# Patient Record
Sex: Female | Born: 1940 | Race: White | Hispanic: No | Marital: Married | Smoking: Former smoker
Health system: Southern US, Community
[De-identification: ages and names within clinical notes are randomized; demographics above are authoritative.]

## PROBLEM LIST (undated history)

## (undated) DIAGNOSIS — J449 Chronic obstructive pulmonary disease, unspecified: Secondary | ICD-10-CM

## (undated) DIAGNOSIS — F32A Depression, unspecified: Secondary | ICD-10-CM

## (undated) DIAGNOSIS — K219 Gastro-esophageal reflux disease without esophagitis: Secondary | ICD-10-CM

## (undated) DIAGNOSIS — E785 Hyperlipidemia, unspecified: Secondary | ICD-10-CM

## (undated) DIAGNOSIS — C801 Malignant (primary) neoplasm, unspecified: Secondary | ICD-10-CM

## (undated) DIAGNOSIS — Z8781 Personal history of (healed) traumatic fracture: Secondary | ICD-10-CM

## (undated) DIAGNOSIS — J439 Emphysema, unspecified: Secondary | ICD-10-CM

## (undated) DIAGNOSIS — F329 Major depressive disorder, single episode, unspecified: Secondary | ICD-10-CM

## (undated) HISTORY — DX: Hyperlipidemia, unspecified: E78.5

## (undated) HISTORY — DX: Emphysema, unspecified: J43.9

## (undated) HISTORY — DX: Chronic obstructive pulmonary disease, unspecified: J44.9

## (undated) HISTORY — PX: FRACTURE SURGERY: SHX138

## (undated) HISTORY — DX: Malignant (primary) neoplasm, unspecified: C80.1

---
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
## 1974-10-11 HISTORY — PX: RIGHT OOPHORECTOMY: SHX2359

## 1995-10-12 HISTORY — PX: CYSTECTOMY W/ URETEROILEAL CONDUIT: SUR361

## 1999-01-09 ENCOUNTER — Other Ambulatory Visit: Admission: RE | Admit: 1999-01-09 | Discharge: 1999-01-09 | Payer: Self-pay | Admitting: Physician Assistant

## 1999-01-21 ENCOUNTER — Ambulatory Visit (HOSPITAL_COMMUNITY): Admission: RE | Admit: 1999-01-21 | Discharge: 1999-01-21 | Payer: Self-pay | Admitting: Internal Medicine

## 1999-01-23 ENCOUNTER — Ambulatory Visit (HOSPITAL_COMMUNITY): Admission: RE | Admit: 1999-01-23 | Discharge: 1999-01-23 | Payer: Self-pay | Admitting: *Deleted

## 1999-02-06 ENCOUNTER — Ambulatory Visit (HOSPITAL_COMMUNITY): Admission: RE | Admit: 1999-02-06 | Discharge: 1999-02-06 | Payer: Self-pay | Admitting: Oncology

## 2001-01-31 ENCOUNTER — Encounter: Admission: RE | Admit: 2001-01-31 | Discharge: 2001-05-01 | Payer: Self-pay | Admitting: Radiation Oncology

## 2002-09-12 ENCOUNTER — Ambulatory Visit: Admission: RE | Admit: 2002-09-12 | Discharge: 2002-10-22 | Payer: Self-pay | Admitting: Radiation Oncology

## 2002-10-26 ENCOUNTER — Ambulatory Visit: Admission: RE | Admit: 2002-10-26 | Discharge: 2002-10-26 | Payer: Self-pay | Admitting: Oncology

## 2002-10-29 ENCOUNTER — Ambulatory Visit: Admission: RE | Admit: 2002-10-29 | Discharge: 2002-12-05 | Payer: Self-pay | Admitting: Radiation Oncology

## 2004-01-13 ENCOUNTER — Encounter: Payer: Self-pay | Admitting: Internal Medicine

## 2004-08-30 ENCOUNTER — Ambulatory Visit: Payer: Self-pay | Admitting: Oncology

## 2004-11-10 ENCOUNTER — Ambulatory Visit: Payer: Self-pay | Admitting: Oncology

## 2005-02-09 ENCOUNTER — Ambulatory Visit: Payer: Self-pay | Admitting: Oncology

## 2005-04-05 ENCOUNTER — Ambulatory Visit: Payer: Self-pay | Admitting: Oncology

## 2005-05-27 ENCOUNTER — Ambulatory Visit: Payer: Self-pay | Admitting: Internal Medicine

## 2005-08-09 ENCOUNTER — Ambulatory Visit: Payer: Self-pay | Admitting: Oncology

## 2005-11-11 ENCOUNTER — Ambulatory Visit: Payer: Self-pay | Admitting: Oncology

## 2006-01-31 ENCOUNTER — Ambulatory Visit (HOSPITAL_COMMUNITY): Admission: RE | Admit: 2006-01-31 | Discharge: 2006-01-31 | Payer: Self-pay | Admitting: Oncology

## 2006-02-03 ENCOUNTER — Ambulatory Visit: Payer: Self-pay | Admitting: Oncology

## 2006-02-04 LAB — CBC WITH DIFFERENTIAL/PLATELET
Basophils Absolute: 0 10*3/uL (ref 0.0–0.1)
Eosinophils Absolute: 0.2 10*3/uL (ref 0.0–0.5)
HCT: 39.8 % (ref 34.8–46.6)
LYMPH%: 19.1 % (ref 14.0–48.0)
MCV: 94 fL (ref 81.0–101.0)
MONO#: 0.5 10*3/uL (ref 0.1–0.9)
MONO%: 9.9 % (ref 0.0–13.0)
NEUT#: 3.1 10*3/uL (ref 1.5–6.5)
NEUT%: 66.6 % (ref 39.6–76.8)
Platelets: 254 10*3/uL (ref 145–400)
WBC: 4.6 10*3/uL (ref 3.9–10.0)

## 2006-02-04 LAB — COMPREHENSIVE METABOLIC PANEL
BUN: 21 mg/dL (ref 6–23)
CO2: 29 mEq/L (ref 19–32)
Creatinine, Ser: 0.8 mg/dL (ref 0.4–1.2)
Glucose, Bld: 89 mg/dL (ref 70–99)
Total Bilirubin: 0.5 mg/dL (ref 0.3–1.2)
Total Protein: 6.8 g/dL (ref 6.0–8.3)

## 2006-02-04 LAB — LACTATE DEHYDROGENASE: LDH: 206 U/L (ref 94–250)

## 2006-03-14 LAB — CBC WITH DIFFERENTIAL/PLATELET
Basophils Absolute: 0.1 10*3/uL (ref 0.0–0.1)
EOS%: 3.6 % (ref 0.0–7.0)
Eosinophils Absolute: 0.2 10*3/uL (ref 0.0–0.5)
HGB: 14.1 g/dL (ref 11.6–15.9)
LYMPH%: 18.6 % (ref 14.0–48.0)
MCH: 33.3 pg (ref 26.0–34.0)
MCV: 92.8 fL (ref 81.0–101.0)
MONO%: 11.3 % (ref 0.0–13.0)
NEUT#: 3.6 10*3/uL (ref 1.5–6.5)
Platelets: 263 10*3/uL (ref 145–400)
RBC: 4.22 10*6/uL (ref 3.70–5.32)
RDW: 11.3 % (ref 11.3–14.5)

## 2006-03-14 LAB — COMPREHENSIVE METABOLIC PANEL
AST: 18 U/L (ref 0–37)
Alkaline Phosphatase: 97 U/L (ref 39–117)
BUN: 19 mg/dL (ref 6–23)
Glucose, Bld: 107 mg/dL — ABNORMAL HIGH (ref 70–99)
Potassium: 3.8 mEq/L (ref 3.5–5.3)
Total Bilirubin: 0.3 mg/dL (ref 0.3–1.2)

## 2006-03-17 ENCOUNTER — Ambulatory Visit: Payer: Self-pay | Admitting: Oncology

## 2006-05-27 ENCOUNTER — Ambulatory Visit: Payer: Self-pay | Admitting: Oncology

## 2006-06-03 LAB — COMPREHENSIVE METABOLIC PANEL
ALT: 14 U/L (ref 0–40)
AST: 24 U/L (ref 0–37)
Albumin: 4.7 g/dL (ref 3.5–5.2)
Alkaline Phosphatase: 121 U/L — ABNORMAL HIGH (ref 39–117)
Calcium: 9.9 mg/dL (ref 8.4–10.5)
Chloride: 104 mEq/L (ref 96–112)
Potassium: 3.9 mEq/L (ref 3.5–5.3)

## 2006-06-03 LAB — CBC WITH DIFFERENTIAL/PLATELET
BASO%: 0.6 % (ref 0.0–2.0)
EOS%: 3.6 % (ref 0.0–7.0)
MCH: 33.5 pg (ref 26.0–34.0)
MCHC: 35.5 g/dL (ref 32.0–36.0)
MCV: 94.3 fL (ref 81.0–101.0)
MONO%: 11.6 % (ref 0.0–13.0)
RBC: 4.24 10*6/uL (ref 3.70–5.32)
RDW: 12.6 % (ref 11.3–14.5)
lymph#: 1 10*3/uL (ref 0.9–3.3)

## 2006-08-04 ENCOUNTER — Ambulatory Visit: Payer: Self-pay | Admitting: Internal Medicine

## 2006-08-10 ENCOUNTER — Ambulatory Visit: Payer: Self-pay | Admitting: Oncology

## 2006-08-12 LAB — CBC WITH DIFFERENTIAL/PLATELET
Basophils Absolute: 0 10*3/uL (ref 0.0–0.1)
EOS%: 3.6 % (ref 0.0–7.0)
Eosinophils Absolute: 0.2 10*3/uL (ref 0.0–0.5)
HCT: 39.5 % (ref 34.8–46.6)
HGB: 13.9 g/dL (ref 11.6–15.9)
MCH: 33.3 pg (ref 26.0–34.0)
MCV: 94.4 fL (ref 81.0–101.0)
MONO%: 9.8 % (ref 0.0–13.0)
NEUT#: 3.3 10*3/uL (ref 1.5–6.5)
NEUT%: 65.7 % (ref 39.6–76.8)
RDW: 12.7 % (ref 11.3–14.5)

## 2006-08-12 LAB — COMPREHENSIVE METABOLIC PANEL
Albumin: 4.8 g/dL (ref 3.5–5.2)
BUN: 19 mg/dL (ref 6–23)
Calcium: 9.7 mg/dL (ref 8.4–10.5)
Chloride: 102 mEq/L (ref 96–112)
Creatinine, Ser: 0.85 mg/dL (ref 0.40–1.20)
Glucose, Bld: 87 mg/dL (ref 70–99)
Potassium: 4 mEq/L (ref 3.5–5.3)

## 2006-09-27 ENCOUNTER — Ambulatory Visit: Payer: Self-pay | Admitting: Oncology

## 2006-12-13 ENCOUNTER — Ambulatory Visit: Payer: Self-pay | Admitting: Oncology

## 2006-12-16 LAB — CBC WITH DIFFERENTIAL/PLATELET
BASO%: 0.5 % (ref 0.0–2.0)
Eosinophils Absolute: 0.2 10*3/uL (ref 0.0–0.5)
HCT: 39.7 % (ref 34.8–46.6)
HGB: 14 g/dL (ref 11.6–15.9)
MCHC: 35.1 g/dL (ref 32.0–36.0)
MONO#: 0.6 10*3/uL (ref 0.1–0.9)
NEUT#: 3.2 10*3/uL (ref 1.5–6.5)
NEUT%: 63.3 % (ref 39.6–76.8)
WBC: 5.1 10*3/uL (ref 3.9–10.0)
lymph#: 1.1 10*3/uL (ref 0.9–3.3)

## 2006-12-16 LAB — COMPREHENSIVE METABOLIC PANEL
ALT: 14 U/L (ref 0–35)
CO2: 27 mEq/L (ref 19–32)
Calcium: 9.7 mg/dL (ref 8.4–10.5)
Chloride: 103 mEq/L (ref 96–112)
Creatinine, Ser: 0.79 mg/dL (ref 0.40–1.20)
Glucose, Bld: 88 mg/dL (ref 70–99)
Sodium: 140 mEq/L (ref 135–145)
Total Protein: 6.8 g/dL (ref 6.0–8.3)

## 2006-12-16 LAB — LACTATE DEHYDROGENASE: LDH: 209 U/L (ref 94–250)

## 2007-03-08 ENCOUNTER — Ambulatory Visit: Payer: Self-pay | Admitting: Oncology

## 2007-03-10 LAB — CBC WITH DIFFERENTIAL/PLATELET
Basophils Absolute: 0.1 10*3/uL (ref 0.0–0.1)
Eosinophils Absolute: 0.3 10*3/uL (ref 0.0–0.5)
HGB: 14 g/dL (ref 11.6–15.9)
MCV: 91.6 fL (ref 81.0–101.0)
MONO#: 0.6 10*3/uL (ref 0.1–0.9)
NEUT#: 3.5 10*3/uL (ref 1.5–6.5)
RBC: 4.23 10*6/uL (ref 3.70–5.32)
RDW: 10.7 % — ABNORMAL LOW (ref 11.3–14.5)
WBC: 5.7 10*3/uL (ref 3.9–10.0)
lymph#: 1.3 10*3/uL (ref 0.9–3.3)

## 2007-05-05 ENCOUNTER — Ambulatory Visit: Payer: Self-pay | Admitting: Internal Medicine

## 2007-05-05 DIAGNOSIS — F329 Major depressive disorder, single episode, unspecified: Secondary | ICD-10-CM

## 2007-05-23 ENCOUNTER — Ambulatory Visit: Payer: Self-pay | Admitting: Oncology

## 2007-05-26 LAB — COMPREHENSIVE METABOLIC PANEL
AST: 20 U/L (ref 0–37)
Albumin: 4.5 g/dL (ref 3.5–5.2)
Alkaline Phosphatase: 116 U/L (ref 39–117)
BUN: 18 mg/dL (ref 6–23)
Calcium: 9.8 mg/dL (ref 8.4–10.5)
Chloride: 104 mEq/L (ref 96–112)
Glucose, Bld: 84 mg/dL (ref 70–99)
Potassium: 4 mEq/L (ref 3.5–5.3)
Sodium: 142 mEq/L (ref 135–145)
Total Protein: 6.5 g/dL (ref 6.0–8.3)

## 2007-05-26 LAB — CBC WITH DIFFERENTIAL/PLATELET
Basophils Absolute: 0 10*3/uL (ref 0.0–0.1)
Eosinophils Absolute: 0.2 10*3/uL (ref 0.0–0.5)
HCT: 37.5 % (ref 34.8–46.6)
HGB: 13.3 g/dL (ref 11.6–15.9)
LYMPH%: 20.3 % (ref 14.0–48.0)
MCV: 92.7 fL (ref 81.0–101.0)
MONO#: 0.7 10*3/uL (ref 0.1–0.9)
MONO%: 13 % (ref 0.0–13.0)
NEUT#: 3.2 10*3/uL (ref 1.5–6.5)
Platelets: 266 10*3/uL (ref 145–400)
WBC: 5.2 10*3/uL (ref 3.9–10.0)

## 2007-05-26 LAB — MORPHOLOGY

## 2007-06-08 ENCOUNTER — Other Ambulatory Visit: Admission: RE | Admit: 2007-06-08 | Discharge: 2007-06-08 | Payer: Self-pay | Admitting: Obstetrics and Gynecology

## 2007-08-24 ENCOUNTER — Ambulatory Visit: Payer: Self-pay | Admitting: Internal Medicine

## 2007-09-11 ENCOUNTER — Ambulatory Visit: Payer: Self-pay | Admitting: Oncology

## 2007-09-13 ENCOUNTER — Encounter: Payer: Self-pay | Admitting: Internal Medicine

## 2007-09-13 LAB — CBC WITH DIFFERENTIAL/PLATELET
BASO%: 0.5 % (ref 0.0–2.0)
EOS%: 3.7 % (ref 0.0–7.0)
HGB: 13.6 g/dL (ref 11.6–15.9)
MCH: 33 pg (ref 26.0–34.0)
MCHC: 35.5 g/dL (ref 32.0–36.0)
MONO#: 0.7 10*3/uL (ref 0.1–0.9)
RDW: 12.8 % (ref 11.3–14.5)
WBC: 6.8 10*3/uL (ref 3.9–10.0)
lymph#: 1.1 10*3/uL (ref 0.9–3.3)

## 2007-09-13 LAB — COMPREHENSIVE METABOLIC PANEL
Alkaline Phosphatase: 124 U/L — ABNORMAL HIGH (ref 39–117)
BUN: 21 mg/dL (ref 6–23)
Creatinine, Ser: 0.86 mg/dL (ref 0.40–1.20)
Glucose, Bld: 96 mg/dL (ref 70–99)
Potassium: 3.8 mEq/L (ref 3.5–5.3)
Total Bilirubin: 0.5 mg/dL (ref 0.3–1.2)

## 2007-09-13 LAB — LACTATE DEHYDROGENASE: LDH: 225 U/L (ref 94–250)

## 2007-09-25 ENCOUNTER — Ambulatory Visit: Payer: Self-pay | Admitting: Internal Medicine

## 2007-10-17 ENCOUNTER — Ambulatory Visit: Payer: Self-pay | Admitting: Internal Medicine

## 2007-11-08 ENCOUNTER — Encounter: Payer: Self-pay | Admitting: Internal Medicine

## 2007-11-27 ENCOUNTER — Ambulatory Visit: Payer: Self-pay | Admitting: Internal Medicine

## 2007-11-27 DIAGNOSIS — C8588 Other specified types of non-Hodgkin lymphoma, lymph nodes of multiple sites: Secondary | ICD-10-CM

## 2007-11-28 LAB — CONVERTED CEMR LAB
Albumin: 3.6 g/dL (ref 3.5–5.2)
Basophils Absolute: 0 10*3/uL (ref 0.0–0.1)
Bilirubin, Direct: 0.1 mg/dL (ref 0.0–0.3)
Creatinine, Ser: 0.8 mg/dL (ref 0.4–1.2)
HCT: 35 % — ABNORMAL LOW (ref 36.0–46.0)
Hemoglobin: 11.9 g/dL — ABNORMAL LOW (ref 12.0–15.0)
MCHC: 34.1 g/dL (ref 30.0–36.0)
MCV: 93.8 fL (ref 78.0–100.0)
Monocytes Absolute: 0.2 10*3/uL (ref 0.2–0.7)
Neutrophils Relative %: 90.4 % — ABNORMAL HIGH (ref 43.0–77.0)
Potassium: 3.8 meq/L (ref 3.5–5.1)
RDW: 12.6 % (ref 11.5–14.6)
Sodium: 139 meq/L (ref 135–145)
Total Bilirubin: 0.5 mg/dL (ref 0.3–1.2)
Total Protein: 6.3 g/dL (ref 6.0–8.3)

## 2007-12-07 ENCOUNTER — Ambulatory Visit: Payer: Self-pay | Admitting: Oncology

## 2007-12-12 ENCOUNTER — Encounter: Payer: Self-pay | Admitting: Internal Medicine

## 2007-12-12 LAB — COMPREHENSIVE METABOLIC PANEL
ALT: 12 U/L (ref 0–35)
AST: 16 U/L (ref 0–37)
Alkaline Phosphatase: 117 U/L (ref 39–117)
BUN: 20 mg/dL (ref 6–23)
Calcium: 10 mg/dL (ref 8.4–10.5)
Creatinine, Ser: 0.77 mg/dL (ref 0.40–1.20)
Total Bilirubin: 0.4 mg/dL (ref 0.3–1.2)

## 2007-12-12 LAB — CBC WITH DIFFERENTIAL/PLATELET
Basophils Absolute: 0 10*3/uL (ref 0.0–0.1)
EOS%: 2.4 % (ref 0.0–7.0)
HCT: 37.1 % (ref 34.8–46.6)
HGB: 13 g/dL (ref 11.6–15.9)
MCH: 31.5 pg (ref 26.0–34.0)
MCV: 89.9 fL (ref 81.0–101.0)
MONO%: 7.8 % (ref 0.0–13.0)
NEUT%: 72.4 % (ref 39.6–76.8)
Platelets: 363 10*3/uL (ref 145–400)

## 2008-02-07 ENCOUNTER — Encounter: Payer: Self-pay | Admitting: Internal Medicine

## 2008-03-08 ENCOUNTER — Encounter: Payer: Self-pay | Admitting: Internal Medicine

## 2008-03-11 ENCOUNTER — Ambulatory Visit: Payer: Self-pay | Admitting: Oncology

## 2008-03-11 ENCOUNTER — Encounter: Payer: Self-pay | Admitting: Internal Medicine

## 2008-03-13 ENCOUNTER — Encounter: Payer: Self-pay | Admitting: Internal Medicine

## 2008-03-13 LAB — CBC WITH DIFFERENTIAL/PLATELET
BASO%: 0.6 % (ref 0.0–2.0)
EOS%: 3.2 % (ref 0.0–7.0)
HCT: 38.4 % (ref 34.8–46.6)
MCH: 32.2 pg (ref 26.0–34.0)
MCHC: 35.1 g/dL (ref 32.0–36.0)
NEUT%: 71 % (ref 39.6–76.8)
lymph#: 0.8 10*3/uL — ABNORMAL LOW (ref 0.9–3.3)

## 2008-03-13 LAB — COMPREHENSIVE METABOLIC PANEL
ALT: 18 U/L (ref 0–35)
AST: 22 U/L (ref 0–37)
Chloride: 102 mEq/L (ref 96–112)
Creatinine, Ser: 0.91 mg/dL (ref 0.40–1.20)
Total Bilirubin: 0.5 mg/dL (ref 0.3–1.2)

## 2008-04-22 ENCOUNTER — Ambulatory Visit: Payer: Self-pay | Admitting: Internal Medicine

## 2008-04-30 LAB — CONVERTED CEMR LAB
ALT: 21 units/L (ref 0–35)
AST: 26 units/L (ref 0–37)
Albumin: 4.1 g/dL (ref 3.5–5.2)
Alkaline Phosphatase: 108 units/L (ref 39–117)
BUN: 16 mg/dL (ref 6–23)
Basophils Relative: 1 % (ref 0.0–1.0)
CO2: 32 meq/L (ref 19–32)
Chloride: 103 meq/L (ref 96–112)
Creatinine, Ser: 0.8 mg/dL (ref 0.4–1.2)
Eosinophils Relative: 3.2 % (ref 0.0–5.0)
Lymphocytes Relative: 17 % (ref 12.0–46.0)
Monocytes Relative: 11 % (ref 3.0–12.0)
Neutrophils Relative %: 67.8 % (ref 43.0–77.0)
RBC: 4.04 M/uL (ref 3.87–5.11)
TSH: 2.76 microintl units/mL (ref 0.35–5.50)
WBC: 4.7 10*3/uL (ref 4.5–10.5)

## 2008-06-18 ENCOUNTER — Ambulatory Visit: Payer: Self-pay | Admitting: Oncology

## 2008-06-21 ENCOUNTER — Encounter: Payer: Self-pay | Admitting: Internal Medicine

## 2008-06-21 LAB — CBC WITH DIFFERENTIAL/PLATELET
BASO%: 0.5 % (ref 0.0–2.0)
EOS%: 3.7 % (ref 0.0–7.0)
MCH: 32.1 pg (ref 26.0–34.0)
MCHC: 35 g/dL (ref 32.0–36.0)
RBC: 4.3 10*6/uL (ref 3.70–5.32)
RDW: 12.7 % (ref 11.3–14.5)
WBC: 4.4 10*3/uL (ref 3.9–10.0)
lymph#: 0.7 10*3/uL — ABNORMAL LOW (ref 0.9–3.3)

## 2008-06-21 LAB — COMPREHENSIVE METABOLIC PANEL
ALT: 18 U/L (ref 0–35)
AST: 25 U/L (ref 0–37)
Albumin: 4.8 g/dL (ref 3.5–5.2)
Calcium: 10 mg/dL (ref 8.4–10.5)
Chloride: 100 mEq/L (ref 96–112)
Creatinine, Ser: 0.86 mg/dL (ref 0.40–1.20)
Potassium: 3.9 mEq/L (ref 3.5–5.3)
Sodium: 140 mEq/L (ref 135–145)
Total Protein: 6.9 g/dL (ref 6.0–8.3)

## 2008-07-16 ENCOUNTER — Ambulatory Visit: Payer: Self-pay | Admitting: Internal Medicine

## 2008-09-18 ENCOUNTER — Ambulatory Visit: Payer: Self-pay | Admitting: Oncology

## 2008-09-20 ENCOUNTER — Encounter: Payer: Self-pay | Admitting: Internal Medicine

## 2008-09-20 LAB — CBC WITH DIFFERENTIAL/PLATELET
BASO%: 0.3 % (ref 0.0–2.0)
EOS%: 1.9 % (ref 0.0–7.0)
MCH: 32.1 pg (ref 26.0–34.0)
MCHC: 34.6 g/dL (ref 32.0–36.0)
MONO#: 0.6 10*3/uL (ref 0.1–0.9)
NEUT%: 76.4 % (ref 39.6–76.8)
RBC: 4.29 10*6/uL (ref 3.70–5.32)
RDW: 13 % (ref 11.3–14.5)
WBC: 5.5 10*3/uL (ref 3.9–10.0)
lymph#: 0.6 10*3/uL — ABNORMAL LOW (ref 0.9–3.3)

## 2008-09-23 LAB — COMPREHENSIVE METABOLIC PANEL
ALT: 20 U/L (ref 0–35)
AST: 23 U/L (ref 0–37)
Calcium: 10.1 mg/dL (ref 8.4–10.5)
Chloride: 103 mEq/L (ref 96–112)
Creatinine, Ser: 0.9 mg/dL (ref 0.40–1.20)
Potassium: 4.1 mEq/L (ref 3.5–5.3)
Sodium: 141 mEq/L (ref 135–145)
Total Protein: 6.8 g/dL (ref 6.0–8.3)

## 2008-11-27 ENCOUNTER — Encounter (INDEPENDENT_AMBULATORY_CARE_PROVIDER_SITE_OTHER): Payer: Self-pay | Admitting: *Deleted

## 2008-12-11 ENCOUNTER — Ambulatory Visit: Payer: Self-pay | Admitting: Internal Medicine

## 2008-12-25 ENCOUNTER — Ambulatory Visit (HOSPITAL_COMMUNITY): Admission: RE | Admit: 2008-12-25 | Discharge: 2008-12-25 | Payer: Self-pay | Admitting: Oncology

## 2008-12-31 ENCOUNTER — Encounter: Payer: Self-pay | Admitting: Internal Medicine

## 2009-01-01 ENCOUNTER — Encounter: Payer: Self-pay | Admitting: Internal Medicine

## 2009-01-09 ENCOUNTER — Ambulatory Visit: Payer: Self-pay | Admitting: Oncology

## 2009-01-09 ENCOUNTER — Ambulatory Visit: Payer: Self-pay | Admitting: Internal Medicine

## 2009-01-14 ENCOUNTER — Encounter: Payer: Self-pay | Admitting: Internal Medicine

## 2009-01-14 LAB — CBC WITH DIFFERENTIAL/PLATELET
Basophils Absolute: 0 10*3/uL (ref 0.0–0.1)
Eosinophils Absolute: 0.1 10*3/uL (ref 0.0–0.5)
HCT: 36.1 % (ref 34.8–46.6)
HGB: 12.5 g/dL (ref 11.6–15.9)
LYMPH%: 13.5 % — ABNORMAL LOW (ref 14.0–49.7)
MCV: 92.6 fL (ref 79.5–101.0)
MONO#: 0.6 10*3/uL (ref 0.1–0.9)
MONO%: 11.7 % (ref 0.0–14.0)
NEUT#: 3.5 10*3/uL (ref 1.5–6.5)
NEUT%: 72.1 % (ref 38.4–76.8)
Platelets: 203 10*3/uL (ref 145–400)
RBC: 3.9 10*6/uL (ref 3.70–5.45)
WBC: 4.9 10*3/uL (ref 3.9–10.3)

## 2009-01-14 LAB — COMPREHENSIVE METABOLIC PANEL
BUN: 27 mg/dL — ABNORMAL HIGH (ref 6–23)
CO2: 28 mEq/L (ref 19–32)
Creatinine, Ser: 1.15 mg/dL (ref 0.40–1.20)
Glucose, Bld: 86 mg/dL (ref 70–99)
Sodium: 141 mEq/L (ref 135–145)
Total Bilirubin: 0.4 mg/dL (ref 0.3–1.2)
Total Protein: 6.5 g/dL (ref 6.0–8.3)

## 2009-01-14 LAB — URIC ACID: Uric Acid, Serum: 7.8 mg/dL — ABNORMAL HIGH (ref 2.4–7.0)

## 2009-01-14 LAB — LACTATE DEHYDROGENASE: LDH: 268 U/L — ABNORMAL HIGH (ref 94–250)

## 2009-01-14 LAB — MORPHOLOGY: RBC Comments: NORMAL

## 2009-01-23 ENCOUNTER — Encounter: Payer: Self-pay | Admitting: Internal Medicine

## 2009-01-23 ENCOUNTER — Ambulatory Visit: Payer: Self-pay | Admitting: Internal Medicine

## 2009-01-25 ENCOUNTER — Encounter: Payer: Self-pay | Admitting: Internal Medicine

## 2009-01-30 ENCOUNTER — Ambulatory Visit (HOSPITAL_COMMUNITY): Admission: RE | Admit: 2009-01-30 | Discharge: 2009-01-30 | Payer: Self-pay | Admitting: Urology

## 2009-01-30 ENCOUNTER — Encounter (INDEPENDENT_AMBULATORY_CARE_PROVIDER_SITE_OTHER): Payer: Self-pay | Admitting: Surgery

## 2009-02-04 ENCOUNTER — Encounter: Payer: Self-pay | Admitting: Internal Medicine

## 2009-02-04 LAB — COMPREHENSIVE METABOLIC PANEL
ALT: 16 U/L (ref 0–35)
AST: 20 U/L (ref 0–37)
Albumin: 4.4 g/dL (ref 3.5–5.2)
Calcium: 9.5 mg/dL (ref 8.4–10.5)
Chloride: 103 mEq/L (ref 96–112)
Potassium: 3.3 mEq/L — ABNORMAL LOW (ref 3.5–5.3)
Total Protein: 6.6 g/dL (ref 6.0–8.3)

## 2009-02-04 LAB — CBC WITH DIFFERENTIAL/PLATELET
BASO%: 0.5 % (ref 0.0–2.0)
Basophils Absolute: 0 10*3/uL (ref 0.0–0.1)
EOS%: 3.8 % (ref 0.0–7.0)
HGB: 12.1 g/dL (ref 11.6–15.9)
MCH: 32.4 pg (ref 25.1–34.0)
MCHC: 35 g/dL (ref 31.5–36.0)
RDW: 13.3 % (ref 11.2–14.5)
lymph#: 0.5 10*3/uL — ABNORMAL LOW (ref 0.9–3.3)

## 2009-02-04 LAB — URIC ACID: Uric Acid, Serum: 4.7 mg/dL (ref 2.4–7.0)

## 2009-02-05 ENCOUNTER — Telehealth: Payer: Self-pay | Admitting: Internal Medicine

## 2009-02-06 ENCOUNTER — Ambulatory Visit: Payer: Self-pay | Admitting: Internal Medicine

## 2009-02-07 ENCOUNTER — Encounter: Payer: Self-pay | Admitting: Internal Medicine

## 2009-02-07 ENCOUNTER — Telehealth: Payer: Self-pay | Admitting: Internal Medicine

## 2009-02-13 ENCOUNTER — Ambulatory Visit: Payer: Self-pay | Admitting: Internal Medicine

## 2009-02-14 ENCOUNTER — Encounter: Payer: Self-pay | Admitting: Internal Medicine

## 2009-02-14 LAB — BASIC METABOLIC PANEL
Calcium: 9.3 mg/dL (ref 8.4–10.5)
Glucose, Bld: 122 mg/dL — ABNORMAL HIGH (ref 70–99)
Potassium: 3 mEq/L — ABNORMAL LOW (ref 3.5–5.3)
Sodium: 141 mEq/L (ref 135–145)

## 2009-02-14 LAB — CBC WITH DIFFERENTIAL/PLATELET
BASO%: 0.4 % (ref 0.0–2.0)
EOS%: 2 % (ref 0.0–7.0)
MCH: 31.9 pg (ref 25.1–34.0)
MCHC: 34.5 g/dL (ref 31.5–36.0)
MCV: 92.4 fL (ref 79.5–101.0)
MONO%: 6.6 % (ref 0.0–14.0)
RBC: 3.87 10*6/uL (ref 3.70–5.45)
RDW: 13.7 % (ref 11.2–14.5)
lymph#: 0.7 10*3/uL — ABNORMAL LOW (ref 0.9–3.3)

## 2009-02-14 LAB — URIC ACID: Uric Acid, Serum: 8.1 mg/dL — ABNORMAL HIGH (ref 2.4–7.0)

## 2009-02-21 ENCOUNTER — Ambulatory Visit: Payer: Self-pay | Admitting: Oncology

## 2009-02-21 LAB — CBC WITH DIFFERENTIAL/PLATELET
Basophils Absolute: 0 10*3/uL (ref 0.0–0.1)
Eosinophils Absolute: 0.2 10*3/uL (ref 0.0–0.5)
HCT: 36.3 % (ref 34.8–46.6)
HGB: 12.5 g/dL (ref 11.6–15.9)
LYMPH%: 12.9 % — ABNORMAL LOW (ref 14.0–49.7)
MCHC: 34.4 g/dL (ref 31.5–36.0)
MONO#: 0.5 10*3/uL (ref 0.1–0.9)
NEUT%: 76.4 % (ref 38.4–76.8)
Platelets: 261 10*3/uL (ref 145–400)
WBC: 6.6 10*3/uL (ref 3.9–10.3)
lymph#: 0.9 10*3/uL (ref 0.9–3.3)

## 2009-02-21 LAB — COMPREHENSIVE METABOLIC PANEL
ALT: 13 U/L (ref 0–35)
BUN: 20 mg/dL (ref 6–23)
CO2: 26 mEq/L (ref 19–32)
Calcium: 10.3 mg/dL (ref 8.4–10.5)
Chloride: 102 mEq/L (ref 96–112)
Creatinine, Ser: 0.94 mg/dL (ref 0.40–1.20)
Glucose, Bld: 116 mg/dL — ABNORMAL HIGH (ref 70–99)
Total Bilirubin: 0.4 mg/dL (ref 0.3–1.2)

## 2009-02-21 LAB — LACTATE DEHYDROGENASE: LDH: 244 U/L (ref 94–250)

## 2009-02-25 LAB — BASIC METABOLIC PANEL
CO2: 29 mEq/L (ref 19–32)
Chloride: 105 mEq/L (ref 96–112)
Glucose, Bld: 128 mg/dL — ABNORMAL HIGH (ref 70–99)
Sodium: 141 mEq/L (ref 135–145)

## 2009-02-25 LAB — URIC ACID: Uric Acid, Serum: 4.9 mg/dL (ref 2.4–7.0)

## 2009-02-26 LAB — BASIC METABOLIC PANEL
BUN: 27 mg/dL — ABNORMAL HIGH (ref 6–23)
CO2: 28 mEq/L (ref 19–32)
Chloride: 102 mEq/L (ref 96–112)
Glucose, Bld: 90 mg/dL (ref 70–99)
Potassium: 3.6 mEq/L (ref 3.5–5.3)

## 2009-03-03 ENCOUNTER — Encounter: Payer: Self-pay | Admitting: Internal Medicine

## 2009-03-03 LAB — COMPREHENSIVE METABOLIC PANEL
Alkaline Phosphatase: 76 U/L (ref 39–117)
CO2: 29 mEq/L (ref 19–32)
Creatinine, Ser: 0.87 mg/dL (ref 0.40–1.20)
Glucose, Bld: 81 mg/dL (ref 70–99)
Sodium: 139 mEq/L (ref 135–145)
Total Bilirubin: 0.8 mg/dL (ref 0.3–1.2)
Total Protein: 6.4 g/dL (ref 6.0–8.3)

## 2009-03-03 LAB — URIC ACID: Uric Acid, Serum: 7 mg/dL (ref 2.4–7.0)

## 2009-03-03 LAB — CBC WITH DIFFERENTIAL/PLATELET
EOS%: 5.9 % (ref 0.0–7.0)
Eosinophils Absolute: 0.3 10*3/uL (ref 0.0–0.5)
LYMPH%: 2.4 % — ABNORMAL LOW (ref 14.0–49.7)
MCH: 32.3 pg (ref 25.1–34.0)
MCHC: 35.1 g/dL (ref 31.5–36.0)
MCV: 92.1 fL (ref 79.5–101.0)
MONO%: 11.7 % (ref 0.0–14.0)
Platelets: 212 10*3/uL (ref 145–400)
RBC: 3.77 10*6/uL (ref 3.70–5.45)

## 2009-03-11 LAB — CBC WITH DIFFERENTIAL/PLATELET
Eosinophils Absolute: 0.2 10*3/uL (ref 0.0–0.5)
HCT: 34.3 % — ABNORMAL LOW (ref 34.8–46.6)
LYMPH%: 14.6 % (ref 14.0–49.7)
MCV: 93.9 fL (ref 79.5–101.0)
MONO#: 0.6 10*3/uL (ref 0.1–0.9)
MONO%: 14.9 % — ABNORMAL HIGH (ref 0.0–14.0)
NEUT#: 2.8 10*3/uL (ref 1.5–6.5)
NEUT%: 64.5 % (ref 38.4–76.8)
Platelets: 205 10*3/uL (ref 145–400)
RBC: 3.65 10*6/uL — ABNORMAL LOW (ref 3.70–5.45)

## 2009-03-11 LAB — COMPREHENSIVE METABOLIC PANEL
Alkaline Phosphatase: 75 U/L (ref 39–117)
BUN: 20 mg/dL (ref 6–23)
CO2: 28 mEq/L (ref 19–32)
Creatinine, Ser: 0.86 mg/dL (ref 0.40–1.20)
Glucose, Bld: 75 mg/dL (ref 70–99)
Sodium: 143 mEq/L (ref 135–145)
Total Bilirubin: 0.5 mg/dL (ref 0.3–1.2)
Total Protein: 6.4 g/dL (ref 6.0–8.3)

## 2009-03-11 LAB — URIC ACID: Uric Acid, Serum: 8.5 mg/dL — ABNORMAL HIGH (ref 2.4–7.0)

## 2009-03-11 LAB — LACTATE DEHYDROGENASE: LDH: 255 U/L — ABNORMAL HIGH (ref 94–250)

## 2009-03-14 ENCOUNTER — Encounter: Payer: Self-pay | Admitting: Internal Medicine

## 2009-03-14 LAB — CBC WITH DIFFERENTIAL/PLATELET
BASO%: 0.6 % (ref 0.0–2.0)
Eosinophils Absolute: 0.3 10*3/uL (ref 0.0–0.5)
MONO#: 0.8 10*3/uL (ref 0.1–0.9)
NEUT#: 3.1 10*3/uL (ref 1.5–6.5)
Platelets: 231 10*3/uL (ref 145–400)
RBC: 3.63 10*6/uL — ABNORMAL LOW (ref 3.70–5.45)
RDW: 14.7 % — ABNORMAL HIGH (ref 11.2–14.5)
WBC: 5.2 10*3/uL (ref 3.9–10.3)
lymph#: 1 10*3/uL (ref 0.9–3.3)

## 2009-03-21 LAB — COMPREHENSIVE METABOLIC PANEL
ALT: 13 U/L (ref 0–35)
CO2: 28 mEq/L (ref 19–32)
Calcium: 9.7 mg/dL (ref 8.4–10.5)
Chloride: 103 mEq/L (ref 96–112)
Creatinine, Ser: 0.97 mg/dL (ref 0.40–1.20)
Glucose, Bld: 74 mg/dL (ref 70–99)
Total Bilirubin: 0.5 mg/dL (ref 0.3–1.2)
Total Protein: 6.9 g/dL (ref 6.0–8.3)

## 2009-03-21 LAB — CBC WITH DIFFERENTIAL/PLATELET
Eosinophils Absolute: 0.3 10*3/uL (ref 0.0–0.5)
HCT: 35 % (ref 34.8–46.6)
HGB: 12.7 g/dL (ref 11.6–15.9)
LYMPH%: 18.8 % (ref 14.0–49.7)
MONO#: 0.6 10*3/uL (ref 0.1–0.9)
NEUT#: 2.4 10*3/uL (ref 1.5–6.5)
NEUT%: 58.9 % (ref 38.4–76.8)
Platelets: 172 10*3/uL (ref 145–400)
WBC: 4.1 10*3/uL (ref 3.9–10.3)
lymph#: 0.8 10*3/uL — ABNORMAL LOW (ref 0.9–3.3)

## 2009-03-21 LAB — LACTATE DEHYDROGENASE: LDH: 225 U/L (ref 94–250)

## 2009-03-21 LAB — URIC ACID: Uric Acid, Serum: 7 mg/dL (ref 2.4–7.0)

## 2009-03-27 ENCOUNTER — Ambulatory Visit (HOSPITAL_BASED_OUTPATIENT_CLINIC_OR_DEPARTMENT_OTHER): Admission: RE | Admit: 2009-03-27 | Discharge: 2009-03-27 | Payer: Self-pay | Admitting: Urology

## 2009-04-04 ENCOUNTER — Ambulatory Visit: Payer: Self-pay | Admitting: Oncology

## 2009-04-08 ENCOUNTER — Encounter: Payer: Self-pay | Admitting: Internal Medicine

## 2009-04-08 LAB — CBC WITH DIFFERENTIAL/PLATELET
BASO%: 0.7 % (ref 0.0–2.0)
Basophils Absolute: 0 10*3/uL (ref 0.0–0.1)
EOS%: 5.2 % (ref 0.0–7.0)
HCT: 30.3 % — ABNORMAL LOW (ref 34.8–46.6)
HGB: 11.1 g/dL — ABNORMAL LOW (ref 11.6–15.9)
MCH: 34.1 pg — ABNORMAL HIGH (ref 25.1–34.0)
MONO#: 0.7 10*3/uL (ref 0.1–0.9)
NEUT%: 67.1 % (ref 38.4–76.8)
RDW: 15.1 % — ABNORMAL HIGH (ref 11.2–14.5)
WBC: 3.8 10*3/uL — ABNORMAL LOW (ref 3.9–10.3)
lymph#: 0.3 10*3/uL — ABNORMAL LOW (ref 0.9–3.3)

## 2009-04-08 LAB — COMPREHENSIVE METABOLIC PANEL
ALT: 12 U/L (ref 0–35)
AST: 16 U/L (ref 0–37)
Albumin: 4 g/dL (ref 3.5–5.2)
BUN: 17 mg/dL (ref 6–23)
CO2: 28 mEq/L (ref 19–32)
Calcium: 9.7 mg/dL (ref 8.4–10.5)
Chloride: 103 mEq/L (ref 96–112)
Creatinine, Ser: 0.89 mg/dL (ref 0.40–1.20)
Potassium: 4.4 mEq/L (ref 3.5–5.3)

## 2009-04-08 LAB — URIC ACID: Uric Acid, Serum: 6.7 mg/dL (ref 2.4–7.0)

## 2009-04-22 LAB — CBC WITH DIFFERENTIAL/PLATELET
Basophils Absolute: 0 10*3/uL (ref 0.0–0.1)
Eosinophils Absolute: 0.3 10*3/uL (ref 0.0–0.5)
HGB: 11.5 g/dL — ABNORMAL LOW (ref 11.6–15.9)
LYMPH%: 7.5 % — ABNORMAL LOW (ref 14.0–49.7)
MCV: 93.1 fL (ref 79.5–101.0)
MONO#: 1.1 10*3/uL — ABNORMAL HIGH (ref 0.1–0.9)
MONO%: 26 % — ABNORMAL HIGH (ref 0.0–14.0)
NEUT#: 2.4 10*3/uL (ref 1.5–6.5)
Platelets: 220 10*3/uL (ref 145–400)
WBC: 4.1 10*3/uL (ref 3.9–10.3)

## 2009-05-09 ENCOUNTER — Encounter: Payer: Self-pay | Admitting: Internal Medicine

## 2009-05-14 ENCOUNTER — Ambulatory Visit: Payer: Self-pay | Admitting: Oncology

## 2009-05-16 ENCOUNTER — Encounter: Payer: Self-pay | Admitting: Internal Medicine

## 2009-05-16 LAB — CBC WITH DIFFERENTIAL/PLATELET
BASO%: 0.8 % (ref 0.0–2.0)
EOS%: 4.9 % (ref 0.0–7.0)
Eosinophils Absolute: 0.2 10*3/uL (ref 0.0–0.5)
HGB: 11.4 g/dL — ABNORMAL LOW (ref 11.6–15.9)
LYMPH%: 16.5 % (ref 14.0–49.7)
MCH: 33.1 pg (ref 25.1–34.0)
MCHC: 35.2 g/dL (ref 31.5–36.0)
MCV: 94.2 fL (ref 79.5–101.0)
MONO%: 18.6 % — ABNORMAL HIGH (ref 0.0–14.0)
NEUT#: 2.8 10*3/uL (ref 1.5–6.5)
Platelets: 172 10*3/uL (ref 145–400)
RBC: 3.44 10*6/uL — ABNORMAL LOW (ref 3.70–5.45)
WBC: 4.7 10*3/uL (ref 3.9–10.3)
nRBC: 0 % (ref 0–0)

## 2009-05-16 LAB — COMPREHENSIVE METABOLIC PANEL
ALT: 16 U/L (ref 0–35)
Alkaline Phosphatase: 100 U/L (ref 39–117)
CO2: 26 mEq/L (ref 19–32)
Creatinine, Ser: 0.87 mg/dL (ref 0.40–1.20)
Total Bilirubin: 0.5 mg/dL (ref 0.3–1.2)

## 2009-05-23 ENCOUNTER — Ambulatory Visit (HOSPITAL_COMMUNITY): Admission: RE | Admit: 2009-05-23 | Discharge: 2009-05-23 | Payer: Self-pay | Admitting: Oncology

## 2009-05-26 ENCOUNTER — Encounter: Payer: Self-pay | Admitting: Internal Medicine

## 2009-05-26 LAB — CBC WITH DIFFERENTIAL/PLATELET
Basophils Absolute: 0 10*3/uL (ref 0.0–0.1)
Eosinophils Absolute: 0.2 10*3/uL (ref 0.0–0.5)
HCT: 30.1 % — ABNORMAL LOW (ref 34.8–46.6)
MCV: 96.7 fL (ref 79.5–101.0)
MONO#: 0.7 10*3/uL (ref 0.1–0.9)
Platelets: 173 10*3/uL (ref 145–400)
RBC: 3.11 10*6/uL — ABNORMAL LOW (ref 3.70–5.45)
RDW: 14.3 % (ref 11.2–14.5)
lymph#: 0.4 10*3/uL — ABNORMAL LOW (ref 0.9–3.3)

## 2009-06-13 ENCOUNTER — Ambulatory Visit: Payer: Self-pay | Admitting: Oncology

## 2009-06-18 ENCOUNTER — Encounter: Payer: Self-pay | Admitting: Internal Medicine

## 2009-06-18 LAB — CBC WITH DIFFERENTIAL/PLATELET
BASO%: 1.1 % (ref 0.0–2.0)
Basophils Absolute: 0 10*3/uL (ref 0.0–0.1)
HCT: 32.3 % — ABNORMAL LOW (ref 34.8–46.6)
HGB: 11.3 g/dL — ABNORMAL LOW (ref 11.6–15.9)
MCHC: 35 g/dL (ref 31.5–36.0)
MONO#: 0.7 10*3/uL (ref 0.1–0.9)
NEUT%: 55.4 % (ref 38.4–76.8)
WBC: 3.5 10*3/uL — ABNORMAL LOW (ref 3.9–10.3)
lymph#: 0.7 10*3/uL — ABNORMAL LOW (ref 0.9–3.3)

## 2009-06-18 LAB — COMPREHENSIVE METABOLIC PANEL
ALT: 14 U/L (ref 0–35)
Albumin: 4.5 g/dL (ref 3.5–5.2)
CO2: 27 mEq/L (ref 19–32)
Calcium: 9.6 mg/dL (ref 8.4–10.5)
Chloride: 105 mEq/L (ref 96–112)
Creatinine, Ser: 0.9 mg/dL (ref 0.40–1.20)
Potassium: 4.1 mEq/L (ref 3.5–5.3)

## 2009-06-24 LAB — CBC WITH DIFFERENTIAL/PLATELET
BASO%: 0.5 % (ref 0.0–2.0)
HCT: 32.9 % — ABNORMAL LOW (ref 34.8–46.6)
MCHC: 34.7 g/dL (ref 31.5–36.0)
MONO#: 0.6 10*3/uL (ref 0.1–0.9)
NEUT%: 63.8 % (ref 38.4–76.8)
RBC: 3.4 10*6/uL — ABNORMAL LOW (ref 3.70–5.45)
RDW: 14.2 % (ref 11.2–14.5)
WBC: 3.7 10*3/uL — ABNORMAL LOW (ref 3.9–10.3)
lymph#: 0.6 10*3/uL — ABNORMAL LOW (ref 0.9–3.3)

## 2009-07-16 ENCOUNTER — Ambulatory Visit: Payer: Self-pay | Admitting: Oncology

## 2009-07-18 ENCOUNTER — Ambulatory Visit (HOSPITAL_COMMUNITY): Admission: RE | Admit: 2009-07-18 | Discharge: 2009-07-18 | Payer: Self-pay | Admitting: Oncology

## 2009-07-18 LAB — CBC WITH DIFFERENTIAL/PLATELET
Eosinophils Absolute: 0.1 10*3/uL (ref 0.0–0.5)
HCT: 32.4 % — ABNORMAL LOW (ref 34.8–46.6)
LYMPH%: 23.3 % (ref 14.0–49.7)
MCHC: 34.9 g/dL (ref 31.5–36.0)
MONO#: 0.4 10*3/uL (ref 0.1–0.9)
NEUT#: 1.6 10*3/uL (ref 1.5–6.5)
NEUT%: 56.2 % (ref 38.4–76.8)
Platelets: 166 10*3/uL (ref 145–400)
WBC: 2.8 10*3/uL — ABNORMAL LOW (ref 3.9–10.3)

## 2009-07-18 LAB — COMPREHENSIVE METABOLIC PANEL
CO2: 28 mEq/L (ref 19–32)
Creatinine, Ser: 0.97 mg/dL (ref 0.40–1.20)
Glucose, Bld: 78 mg/dL (ref 70–99)
Total Bilirubin: 0.4 mg/dL (ref 0.3–1.2)

## 2009-07-18 LAB — LACTATE DEHYDROGENASE: LDH: 240 U/L (ref 94–250)

## 2009-07-22 ENCOUNTER — Encounter (INDEPENDENT_AMBULATORY_CARE_PROVIDER_SITE_OTHER): Payer: Self-pay | Admitting: *Deleted

## 2009-08-01 ENCOUNTER — Encounter (INDEPENDENT_AMBULATORY_CARE_PROVIDER_SITE_OTHER): Payer: Self-pay | Admitting: *Deleted

## 2009-08-25 ENCOUNTER — Ambulatory Visit: Payer: Self-pay | Admitting: Family Medicine

## 2009-08-28 ENCOUNTER — Ambulatory Visit: Payer: Self-pay | Admitting: Oncology

## 2009-09-09 ENCOUNTER — Ambulatory Visit (HOSPITAL_COMMUNITY): Admission: RE | Admit: 2009-09-09 | Discharge: 2009-09-09 | Payer: Self-pay | Admitting: Oncology

## 2009-09-23 ENCOUNTER — Encounter (INDEPENDENT_AMBULATORY_CARE_PROVIDER_SITE_OTHER): Payer: Self-pay | Admitting: *Deleted

## 2009-09-23 ENCOUNTER — Encounter: Payer: Self-pay | Admitting: Internal Medicine

## 2009-09-23 LAB — CBC WITH DIFFERENTIAL/PLATELET
EOS%: 3.1 % (ref 0.0–7.0)
Eosinophils Absolute: 0.1 10*3/uL (ref 0.0–0.5)
LYMPH%: 10.2 % — ABNORMAL LOW (ref 14.0–49.7)
MCH: 35.7 pg — ABNORMAL HIGH (ref 25.1–34.0)
MCHC: 35.4 g/dL (ref 31.5–36.0)
MCV: 100.9 fL (ref 79.5–101.0)
MONO%: 15.6 % — ABNORMAL HIGH (ref 0.0–14.0)
NEUT#: 3 10*3/uL (ref 1.5–6.5)
Platelets: 233 10*3/uL (ref 145–400)
RBC: 3.49 10*6/uL — ABNORMAL LOW (ref 3.70–5.45)

## 2009-09-23 LAB — COMPREHENSIVE METABOLIC PANEL
Alkaline Phosphatase: 96 U/L (ref 39–117)
Glucose, Bld: 87 mg/dL (ref 70–99)
Sodium: 142 mEq/L (ref 135–145)
Total Bilirubin: 0.4 mg/dL (ref 0.3–1.2)
Total Protein: 7 g/dL (ref 6.0–8.3)

## 2009-09-30 ENCOUNTER — Ambulatory Visit: Payer: Self-pay | Admitting: Oncology

## 2009-10-21 ENCOUNTER — Ambulatory Visit (HOSPITAL_COMMUNITY): Admission: RE | Admit: 2009-10-21 | Discharge: 2009-10-21 | Payer: Self-pay | Admitting: Urology

## 2009-10-28 ENCOUNTER — Encounter: Payer: Self-pay | Admitting: Internal Medicine

## 2009-10-30 ENCOUNTER — Ambulatory Visit: Payer: Self-pay | Admitting: Oncology

## 2009-11-03 LAB — CBC WITH DIFFERENTIAL/PLATELET
Basophils Absolute: 0 10*3/uL (ref 0.0–0.1)
EOS%: 3.5 % (ref 0.0–7.0)
Eosinophils Absolute: 0.1 10*3/uL (ref 0.0–0.5)
HGB: 12.6 g/dL (ref 11.6–15.9)
MCH: 35.8 pg — ABNORMAL HIGH (ref 25.1–34.0)
MCV: 99.8 fL (ref 79.5–101.0)
MONO%: 13.9 % (ref 0.0–14.0)
NEUT#: 2.7 10*3/uL (ref 1.5–6.5)
RBC: 3.53 10*6/uL — ABNORMAL LOW (ref 3.70–5.45)
RDW: 13 % (ref 11.2–14.5)
lymph#: 0.5 10*3/uL — ABNORMAL LOW (ref 0.9–3.3)

## 2009-11-03 LAB — COMPREHENSIVE METABOLIC PANEL
AST: 20 U/L (ref 0–37)
Albumin: 4.8 g/dL (ref 3.5–5.2)
Alkaline Phosphatase: 111 U/L (ref 39–117)
BUN: 22 mg/dL (ref 6–23)
Calcium: 9.8 mg/dL (ref 8.4–10.5)
Chloride: 102 mEq/L (ref 96–112)
Potassium: 3.9 mEq/L (ref 3.5–5.3)
Sodium: 142 mEq/L (ref 135–145)
Total Protein: 6.7 g/dL (ref 6.0–8.3)

## 2009-12-02 ENCOUNTER — Encounter: Payer: Self-pay | Admitting: Internal Medicine

## 2009-12-02 ENCOUNTER — Ambulatory Visit: Payer: Self-pay | Admitting: Oncology

## 2009-12-02 LAB — COMPREHENSIVE METABOLIC PANEL
Alkaline Phosphatase: 102 U/L (ref 39–117)
BUN: 17 mg/dL (ref 6–23)
Creatinine, Ser: 0.87 mg/dL (ref 0.40–1.20)
Glucose, Bld: 86 mg/dL (ref 70–99)
Sodium: 141 mEq/L (ref 135–145)
Total Bilirubin: 0.5 mg/dL (ref 0.3–1.2)

## 2009-12-02 LAB — CBC WITH DIFFERENTIAL/PLATELET
Eosinophils Absolute: 0.1 10*3/uL (ref 0.0–0.5)
HCT: 35.4 % (ref 34.8–46.6)
HGB: 12.5 g/dL (ref 11.6–15.9)
LYMPH%: 11.7 % — ABNORMAL LOW (ref 14.0–49.7)
MONO%: 11.4 % (ref 0.0–14.0)
NEUT%: 73.4 % (ref 38.4–76.8)
RBC: 3.58 10*6/uL — ABNORMAL LOW (ref 3.70–5.45)
WBC: 4.2 10*3/uL (ref 3.9–10.3)

## 2009-12-02 LAB — LACTATE DEHYDROGENASE: LDH: 213 U/L (ref 94–250)

## 2009-12-25 ENCOUNTER — Ambulatory Visit (HOSPITAL_COMMUNITY)
Admission: RE | Admit: 2009-12-25 | Discharge: 2009-12-25 | Payer: Self-pay | Source: Home / Self Care | Admitting: Oncology

## 2010-02-23 ENCOUNTER — Ambulatory Visit: Payer: Self-pay | Admitting: Oncology

## 2010-02-24 ENCOUNTER — Encounter: Payer: Self-pay | Admitting: Internal Medicine

## 2010-02-24 LAB — CBC WITH DIFFERENTIAL/PLATELET
BASO%: 0.5 % (ref 0.0–2.0)
Basophils Absolute: 0 10*3/uL (ref 0.0–0.1)
Eosinophils Absolute: 0.2 10*3/uL (ref 0.0–0.5)
HCT: 34.2 % — ABNORMAL LOW (ref 34.8–46.6)
HGB: 12.2 g/dL (ref 11.6–15.9)
LYMPH%: 11.4 % — ABNORMAL LOW (ref 14.0–49.7)
MONO#: 0.6 10*3/uL (ref 0.1–0.9)
NEUT#: 3 10*3/uL (ref 1.5–6.5)
NEUT%: 70.6 % (ref 38.4–76.8)
Platelets: 243 10*3/uL (ref 145–400)
WBC: 4.3 10*3/uL (ref 3.9–10.3)
lymph#: 0.5 10*3/uL — ABNORMAL LOW (ref 0.9–3.3)

## 2010-02-24 LAB — COMPREHENSIVE METABOLIC PANEL
AST: 21 U/L (ref 0–37)
Albumin: 4.4 g/dL (ref 3.5–5.2)
BUN: 16 mg/dL (ref 6–23)
CO2: 29 mEq/L (ref 19–32)
Calcium: 9.8 mg/dL (ref 8.4–10.5)
Chloride: 104 mEq/L (ref 96–112)
Creatinine, Ser: 0.86 mg/dL (ref 0.40–1.20)
Potassium: 3.9 mEq/L (ref 3.5–5.3)

## 2010-02-27 ENCOUNTER — Encounter: Payer: Self-pay | Admitting: Internal Medicine

## 2010-03-26 ENCOUNTER — Ambulatory Visit: Payer: Self-pay | Admitting: Oncology

## 2010-03-30 LAB — CBC WITH DIFFERENTIAL/PLATELET
BASO%: 0.6 % (ref 0.0–2.0)
Eosinophils Absolute: 0.2 10*3/uL (ref 0.0–0.5)
LYMPH%: 12.9 % — ABNORMAL LOW (ref 14.0–49.7)
MCHC: 33.9 g/dL (ref 31.5–36.0)
MONO#: 0.6 10*3/uL (ref 0.1–0.9)
NEUT#: 3.3 10*3/uL (ref 1.5–6.5)
Platelets: 202 10*3/uL (ref 145–400)
RBC: 3.63 10*6/uL — ABNORMAL LOW (ref 3.70–5.45)
RDW: 13.1 % (ref 11.2–14.5)
WBC: 4.9 10*3/uL (ref 3.9–10.3)
lymph#: 0.6 10*3/uL — ABNORMAL LOW (ref 0.9–3.3)

## 2010-05-22 ENCOUNTER — Ambulatory Visit: Payer: Self-pay | Admitting: Internal Medicine

## 2010-05-25 LAB — CONVERTED CEMR LAB
Alkaline Phosphatase: 97 units/L (ref 39–117)
BUN: 18 mg/dL (ref 6–23)
Basophils Relative: 0.6 % (ref 0.0–3.0)
Bilirubin, Direct: 0 mg/dL (ref 0.0–0.3)
CO2: 31 meq/L (ref 19–32)
Calcium: 9.8 mg/dL (ref 8.4–10.5)
Creatinine, Ser: 0.8 mg/dL (ref 0.4–1.2)
Eosinophils Absolute: 0.2 10*3/uL (ref 0.0–0.7)
Lymphocytes Relative: 12.5 % (ref 12.0–46.0)
MCHC: 34.8 g/dL (ref 30.0–36.0)
Monocytes Absolute: 0.5 10*3/uL (ref 0.1–1.0)
Neutrophils Relative %: 69.9 % (ref 43.0–77.0)
Platelets: 228 10*3/uL (ref 150.0–400.0)
RBC: 3.58 M/uL — ABNORMAL LOW (ref 3.87–5.11)
Total Bilirubin: 0.5 mg/dL (ref 0.3–1.2)
Total Protein: 6.5 g/dL (ref 6.0–8.3)
WBC: 4.2 10*3/uL — ABNORMAL LOW (ref 4.5–10.5)

## 2010-05-28 DIAGNOSIS — Z860100 Personal history of colon polyps, unspecified: Secondary | ICD-10-CM | POA: Insufficient documentation

## 2010-05-29 ENCOUNTER — Ambulatory Visit: Payer: Self-pay | Admitting: Oncology

## 2010-05-29 ENCOUNTER — Encounter: Payer: Self-pay | Admitting: Internal Medicine

## 2010-06-02 ENCOUNTER — Ambulatory Visit (HOSPITAL_COMMUNITY): Admission: RE | Admit: 2010-06-02 | Discharge: 2010-06-02 | Payer: Self-pay | Admitting: Oncology

## 2010-06-02 LAB — COMPREHENSIVE METABOLIC PANEL
AST: 21 U/L (ref 0–37)
Albumin: 4.2 g/dL (ref 3.5–5.2)
Alkaline Phosphatase: 96 U/L (ref 39–117)
Potassium: 3.8 mEq/L (ref 3.5–5.3)
Sodium: 141 mEq/L (ref 135–145)
Total Bilirubin: 0.7 mg/dL (ref 0.3–1.2)
Total Protein: 6.7 g/dL (ref 6.0–8.3)

## 2010-06-02 LAB — MORPHOLOGY

## 2010-06-02 LAB — CBC WITH DIFFERENTIAL/PLATELET
EOS%: 4.6 % (ref 0.0–7.0)
Eosinophils Absolute: 0.2 10*3/uL (ref 0.0–0.5)
LYMPH%: 16 % (ref 14.0–49.7)
MCH: 33.8 pg (ref 25.1–34.0)
MCHC: 34.7 g/dL (ref 31.5–36.0)
MCV: 97.5 fL (ref 79.5–101.0)
MONO%: 12 % (ref 0.0–14.0)
NEUT#: 2.6 10*3/uL (ref 1.5–6.5)
Platelets: 235 10*3/uL (ref 145–400)
RBC: 3.65 10*6/uL — ABNORMAL LOW (ref 3.70–5.45)

## 2010-06-09 ENCOUNTER — Encounter: Payer: Self-pay | Admitting: Internal Medicine

## 2010-06-30 ENCOUNTER — Ambulatory Visit: Payer: Self-pay | Admitting: Oncology

## 2010-07-14 ENCOUNTER — Ambulatory Visit: Payer: Self-pay | Admitting: Internal Medicine

## 2010-08-21 ENCOUNTER — Ambulatory Visit: Payer: Self-pay | Admitting: Oncology

## 2010-08-25 ENCOUNTER — Encounter: Payer: Self-pay | Admitting: Internal Medicine

## 2010-08-25 LAB — CBC WITH DIFFERENTIAL/PLATELET
BASO%: 0.4 % (ref 0.0–2.0)
Basophils Absolute: 0 10*3/uL (ref 0.0–0.1)
Eosinophils Absolute: 0.2 10*3/uL (ref 0.0–0.5)
HCT: 36.5 % (ref 34.8–46.6)
HGB: 13 g/dL (ref 11.6–15.9)
LYMPH%: 15.8 % (ref 14.0–49.7)
MONO#: 0.4 10*3/uL (ref 0.1–0.9)
NEUT#: 2.5 10*3/uL (ref 1.5–6.5)
NEUT%: 67.2 % (ref 38.4–76.8)
Platelets: 250 10*3/uL (ref 145–400)
WBC: 3.8 10*3/uL — ABNORMAL LOW (ref 3.9–10.3)
lymph#: 0.6 10*3/uL — ABNORMAL LOW (ref 0.9–3.3)

## 2010-08-25 LAB — COMPREHENSIVE METABOLIC PANEL
AST: 19 U/L (ref 0–37)
Alkaline Phosphatase: 121 U/L — ABNORMAL HIGH (ref 39–117)
CO2: 28 mEq/L (ref 19–32)
Calcium: 9.9 mg/dL (ref 8.4–10.5)
Glucose, Bld: 90 mg/dL (ref 70–99)
Potassium: 3.6 mEq/L (ref 3.5–5.3)
Sodium: 143 mEq/L (ref 135–145)

## 2010-08-25 LAB — MORPHOLOGY: PLT EST: ADEQUATE

## 2010-09-22 ENCOUNTER — Ambulatory Visit: Payer: Self-pay | Admitting: Oncology

## 2010-11-10 NOTE — Assessment & Plan Note (Signed)
Summary: fu on meds/njr   Vital Signs:  Patient profile:   70 year old female Height:      64 inches Weight:      131 pounds BMI:     22.57 Temp:     98.7 degrees F oral Pulse rate:   66 / minute BP sitting:   120 / 80  (left arm) Cuff size:   regular  Vitals Entered By: Romualdo Bolk, CMA (AAMA) (May 22, 2010 10:23 AM) CC: Follow-up visit on meds- Pt would like to have labs done but she is not fasting.   Primary Care Provider:  Birdie Sons, MD  CC:  Follow-up visit on meds- Pt would like to have labs done but she is not fasting.Marland Kitchen  History of Present Illness:  Follow-Up Visit      This is a 70 year old woman who presents for Follow-up visit.  The patient denies chest pain and palpitations.  Since the last visit the patient notes no new problems or concerns.  The patient reports taking meds as prescribed.  When questioned about possible medication side effects, the patient notes none.  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 Take 1 Tab By Mouth Every Day--Needs Office Visit For Additional Refills 2)  Calcium 600/vitamin D 600-200 Mg-Unit  Tabs (Calcium Carbonate-Vitamin D) .... One Bid 3)  Triamcinolone Acetonide 0.1 % Crea (Triamcinolone Acetonide) .... Apply To Affected Rash Two Times A Day As Needed 4)  Doxycycline Hyclate 100 Mg Caps (Doxycycline Hyclate) .Marland Kitchen.. 1 By Mouth Once Daily 5)  Vitamin D 1000 Unit  Tabs (Cholecalciferol)  Allergies (verified): 1)  ! Pcn 2)  ! * Ivp  Dye 3)  ! Betadine 4)  ! Adhesive Tape  Past History:  Past Surgical History: Last updated: 02/11/2009 Kidney Surgery-section of urethral tube removed Wisdom Teeth x2 Right side lymph node resection Right oophorectomy Left inguinal lymph node excised Urethral Stent placement  Family History: Last updated: 02/11/2009 Family History of Glaucoma Family History of Aneurysm Aortic Family History Hypertension Family History Kidney disease Family History of Colon Cancer: Mother  Social History: Last updated: 02/11/2009 Married Alcohol use-yes Drug use-no Former Smoker--quit 5/09  Risk Factors: Smoking Status: quit (05/22/2010) Packs/Day: 1/2 (05/22/2010)  Past Medical History: Post-Menopausal COLONIC POLYPS, HYPERPLASTIC, HX OF (ICD-V12.72) 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 Continue current medications. Her updated medication list for this problem includes:    Celexa 40 Mg Tabs (Citalopram hydrobromide) .Marland Kitchen... Take 1 tab by mouth every day--needs office visit for additional refills  Orders: Specimen Handling (16109) TLB-TSH (Thyroid Stimulating Hormone) (84443-TSH)  Problem # 2:  NON-HODGKIN'S LYMPHOMA (ICD-202.80) has regular followup with oncology. Doing well. Orders: Venipuncture (60454) Specimen Handling (09811) TLB-BMP (Basic Metabolic Panel-BMET) (80048-METABOL) TLB-CBC Platelet - w/Differential (85025-CBCD) TLB-Hepatic/Liver Function Pnl (80076-HEPATIC)  Problem # 3:  TOBACCO USE (ICD-305.1) she has quit. Will remove from problem list.  Complete Medication List: 1)  Celexa  40 Mg Tabs (Citalopram hydrobromide) .... Take 1 tab by mouth every day--needs office visit for additional refills 2)  Calcium 600/vitamin D 600-200 Mg-unit Tabs (Calcium carbonate-vitamin d) .... One bid 3)  Triamcinolone Acetonide 0.1 % Crea (Triamcinolone acetonide)  .... Apply to affected rash two times a day as needed 4)  Doxycycline Hyclate 100 Mg Caps (Doxycycline hyclate) .Marland Kitchen.. 1 by mouth once daily 5)  Vitamin D 1000 Unit Tabs (Cholecalciferol)  Other Orders: Pneumococcal Vaccine (16109) Admin 1st Vaccine (60454) Prescriptions: CELEXA 40 MG TABS (CITALOPRAM HYDROBROMIDE) Take 1 tab by mouth every day--NEEDS OFFICE VISIT FOR ADDITIONAL REFILLS  #90 x 3   Entered and Authorized by:   Birdie Sons MD   Signed by:   Birdie Sons MD on 05/22/2010   Method used:   Print then Give to Patient   RxID:   [id number]     Immunizations Administered:  Pneumonia Vaccine:    Vaccine Type: Pneumovax (Medicare)    Site: left deltoid    Mfr: Merck    Dose: 0.5 ml    Route: IM    Given by: Romualdo Bolk, CMA (AAMA)    Exp. Date: 05/16/2011    Lot #: [id number]

## 2010-11-10 NOTE — Assessment & Plan Note (Signed)
Summary: FLU SHOT // RS  Nurse Visit   Allergies: 1)  ! Pcn 2)  ! * Ivp Dye 3)  ! Betadine 4)  ! Adhesive Tape  Orders Added: 1)  Flu Vaccine 73yrs + MEDICARE PATIENTS [Q2039] 2)  Administration Flu vaccine - MCR [G0008] Flu Vaccine Consent Questions     Do you have a history of severe allergic reactions to this vaccine? no    Any prior history of allergic reactions to egg and/or gelatin? no    Do you have a sensitivity to the preservative Thimersol? no    Do you have a past history of Guillan-Barre Syndrome? no    Do you currently have an acute febrile illness? no    Have you ever had a severe reaction to latex? no    Vaccine information given and explained to patient? yes    Are you currently pregnant? no    Lot Number:[id number]   Exp Date:04/10/2011   Site Given  Left Deltoid IM.lbmedflu

## 2010-11-10 NOTE — Letter (Signed)
Summary: [facility]  [facility]   Imported By: Maryln Gottron 11/13/2009 14:11:26  _____________________________________________________________________  External Attachment:    Type:   Image     Comment:   External Document

## 2010-11-10 NOTE — Letter (Signed)
Summary: [facility]  [facility]: [facility]   Imported By: Maryln Gottron 07/06/2010 15:18:04  _____________________________________________________________________  External Attachment:    Type:   Image     Comment:   External Document

## 2010-11-10 NOTE — Letter (Signed)
Summary: [facility]   Imported By: Sherian Rein 06/19/2010 12:29:57  _____________________________________________________________________  External Attachment:    Type:   Image     Comment:   External Document

## 2010-11-10 NOTE — Letter (Signed)
[facility]   Imported By: Maryln Gottron 11/13/2009 10:59:04  _____________________________________________________________________  External Attachment:    Type:   Image     Comment:   External Document

## 2010-11-10 NOTE — Letter (Signed)
Summary: [facility]   Imported By: Maryln Gottron 01/07/2010 10:16:26  _____________________________________________________________________  External Attachment:    Type:   Image     Comment:   External Document

## 2010-11-10 NOTE — Letter (Signed)
Summary: [facility]  [facility]   Imported By: Maryln Gottron 09/11/2010 13:50:57  _____________________________________________________________________  External Attachment:    Type:   Image     Comment:   External Document

## 2010-11-10 NOTE — Letter (Signed)
Summary: [facility]   Imported By: Maryln Gottron 03/05/2010 13:44:34  _____________________________________________________________________  External Attachment:    Type:   Image     Comment:   External Document

## 2010-12-01 ENCOUNTER — Other Ambulatory Visit: Payer: Self-pay | Admitting: Oncology

## 2010-12-01 ENCOUNTER — Encounter (HOSPITAL_BASED_OUTPATIENT_CLINIC_OR_DEPARTMENT_OTHER): Payer: Medicare Other | Admitting: Oncology

## 2010-12-01 DIAGNOSIS — C8588 Other specified types of non-Hodgkin lymphoma, lymph nodes of multiple sites: Secondary | ICD-10-CM

## 2010-12-01 DIAGNOSIS — Z5112 Encounter for antineoplastic immunotherapy: Secondary | ICD-10-CM

## 2010-12-01 LAB — COMPREHENSIVE METABOLIC PANEL
Albumin: 4.6 g/dL (ref 3.5–5.2)
Alkaline Phosphatase: 113 U/L (ref 39–117)
BUN: 17 mg/dL (ref 6–23)
CO2: 31 mEq/L (ref 19–32)
Glucose, Bld: 87 mg/dL (ref 70–99)
Potassium: 4.2 mEq/L (ref 3.5–5.3)
Total Bilirubin: 0.5 mg/dL (ref 0.3–1.2)

## 2010-12-01 LAB — CBC WITH DIFFERENTIAL/PLATELET
BASO%: 0.5 % (ref 0.0–2.0)
EOS%: 4.5 % (ref 0.0–7.0)
LYMPH%: 12.5 % — ABNORMAL LOW (ref 14.0–49.7)
MCH: 34.3 pg — ABNORMAL HIGH (ref 25.1–34.0)
MCHC: 35.2 g/dL (ref 31.5–36.0)
MCV: 97.3 fL (ref 79.5–101.0)
MONO%: 15.3 % — ABNORMAL HIGH (ref 0.0–14.0)
Platelets: 234 10*3/uL (ref 145–400)
RBC: 3.72 10*6/uL (ref 3.70–5.45)
RDW: 13 % (ref 11.2–14.5)

## 2010-12-01 LAB — MORPHOLOGY

## 2010-12-01 LAB — LACTATE DEHYDROGENASE: LDH: 208 U/L (ref 94–250)

## 2010-12-22 ENCOUNTER — Encounter (HOSPITAL_BASED_OUTPATIENT_CLINIC_OR_DEPARTMENT_OTHER): Payer: Medicare Other | Admitting: Oncology

## 2010-12-22 DIAGNOSIS — Z5112 Encounter for antineoplastic immunotherapy: Secondary | ICD-10-CM

## 2010-12-22 DIAGNOSIS — C8588 Other specified types of non-Hodgkin lymphoma, lymph nodes of multiple sites: Secondary | ICD-10-CM

## 2011-01-13 LAB — GLUCOSE, CAPILLARY: Glucose-Capillary: 84 mg/dL (ref 70–99)

## 2011-01-18 LAB — BASIC METABOLIC PANEL
CO2: 30 mEq/L (ref 19–32)
Calcium: 9.1 mg/dL (ref 8.4–10.5)
GFR calc Af Amer: >60 mL/min (ref >60)
Sodium: 140 mEq/L (ref 135–145)

## 2011-01-18 LAB — POCT HEMOGLOBIN-HEMACUE: Hemoglobin: 11.8 g/dL — ABNORMAL LOW (ref 12.0–15.0)

## 2011-01-20 LAB — DIFFERENTIAL
Basophils Absolute: 0 10*3/uL (ref 0.0–0.1)
Basophils Relative: 0 % (ref 0–1)
Eosinophils Relative: 1 % (ref 0–5)
Monocytes Absolute: 0.9 10*3/uL (ref 0.1–1.0)
Monocytes Relative: 13 % — ABNORMAL HIGH (ref 3–12)
Neutro Abs: 5.4 10*3/uL (ref 1.7–7.7)

## 2011-01-20 LAB — CBC
HCT: 35.4 % — ABNORMAL LOW (ref 36.0–46.0)
Hemoglobin: 12.4 g/dL (ref 12.0–15.0)
MCHC: 34.9 g/dL (ref 30.0–36.0)
MCV: 94.3 fL (ref 78.0–100.0)
RBC: 3.76 MIL/uL — ABNORMAL LOW (ref 3.87–5.11)
RDW: 12.9 % (ref 11.5–15.5)

## 2011-01-20 LAB — PROTIME-INR
INR: 1 (ref 0.00–1.49)
Prothrombin Time: 13.5 seconds (ref 11.6–15.2)

## 2011-01-20 LAB — BASIC METABOLIC PANEL
CO2: 31 mEq/L (ref 19–32)
Chloride: 100 mEq/L (ref 96–112)
GFR calc Af Amer: 46 mL/min — ABNORMAL LOW (ref >60)
Glucose, Bld: 91 mg/dL (ref 70–99)
Potassium: 4.7 mEq/L (ref 3.5–5.1)
Sodium: 140 mEq/L (ref 135–145)

## 2011-01-20 LAB — URINALYSIS, ROUTINE W REFLEX MICROSCOPIC
Bilirubin Urine: NEGATIVE
Ketones, ur: NEGATIVE mg/dL
Nitrite: NEGATIVE
Specific Gravity, Urine: 1.011 (ref 1.005–1.030)
Urobilinogen, UA: 0.2 mg/dL (ref 0.0–1.0)

## 2011-01-20 LAB — URINE MICROSCOPIC-ADD ON

## 2011-01-21 LAB — GLUCOSE, CAPILLARY: Glucose-Capillary: 83 mg/dL (ref 70–99)

## 2011-02-23 NOTE — Op Note (Signed)
Brooke Washington, Brooke Washington NO.:  [id number]   MEDICAL RECORD NO.:  [id number]          PATIENT TYPE:  AMB   LOCATION:  [facility]                         FACILITY:  [facility]   PHYSICIAN:  Heloise Purpura, MD      DATE OF BIRTH:  1940-11-27   DATE OF PROCEDURE:  03/27/2009  DATE OF DISCHARGE:                               OPERATIVE REPORT   PREOPERATIVE DIAGNOSIS:  Left ureteral obstruction.   POSTOPERATIVE DIAGNOSIS:  Left ureteral obstruction.   PROCEDURE:  1. Cystoscopy.  2. Left ureteral stent change (5 x 24 Polaris).   SURGEON:  Dr. Heloise Purpura.   ANESTHESIA:  General.   COMPLICATIONS:  None.   INDICATIONS:  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  She recently  had a ureteral stent placed due to the development of left-sided  hydronephrosis and presumed ureteral obstruction.  She did not tolerate  her stent well and is here today for stent change.  The potential risks,  complications, and alternative treatment options associated with the  above procedures were discussed in detail and informed consent was  obtained.   DESCRIPTION OF PROCEDURE:  The patient was taken to the operating room  and a general anesthetic was administered.  She was given preoperative  antibiotics, placed in the dorsal lithotomy position, and prepped and  draped in the usual sterile fashion.  Next, a preoperative time-out was  performed.  Cystourethroscopy was then performed which revealed no  abnormalities except the indwelling left ureteral stent.  This was  brought out through the urethral meatus with the aid of the flexible  graspers.  At 0.038 Sensor guidewire was advanced up the stent and into  the left renal pelvis under fluoroscopic guidance.  The wire was then  back loaded over the cystoscope and a 5 x 24-French Polaris stent was  advanced over the wire and appropriately positioned under fluoroscopic  and  cystoscopic guidance.  The wire was then removed with good curl  noted in the renal pelvis.  The stent was appropriately positioned  distally cystoscopically.  The patient's bladder was then emptied and  the procedure ended.  She tolerated the procedure well without  complications.  She was able to be awakened and transferred to the  recovery unit in satisfactory condition.      Heloise Purpura, MD  Electronically Signed    LB/MEDQ  D:  03/27/2009  T:  03/27/2009  Job:  [id number]

## 2011-02-23 NOTE — Op Note (Signed)
Brooke Washington, Brooke Washington NO.:  [id number]   MEDICAL RECORD NO.:  [id number]          PATIENT TYPE:  AMB   LOCATION:  DAY                          FACILITY:  [facility]   PHYSICIAN:  Heloise Purpura, MD      DATE OF BIRTH:  [birth year]   DATE OF PROCEDURE:  01/30/2009  DATE OF DISCHARGE:                               OPERATIVE REPORT   PREOPERATIVE DIAGNOSIS:  1. Lymphoma.  2. Left hydronephrosis with probable ureteral obstruction.   POSTOPERATIVE DIAGNOSES:  1. Lymphoma.  2. Left hydronephrosis with probable ureteral obstruction.   PROCEDURE:  1. Cystoscopy.  2. Left retrograde pyelography.  3. Left ureteral stent placement (6 x 24).   SURGEON:  Heloise Purpura, M.D.   ANESTHESIA:  General.   COMPLICATIONS:  None.   INDICATIONS:  Brooke Washington is a 70 year old female with a history of  lymphoma.  On recent imaging, she was found to have a new left-sided  hydronephrosis, thought to be due to extrinsic obstruction related to  her lymphoma.  She was also noted to have increased lymphadenopathy,  including an enlarged left inguinal node, and was scheduled to undergo  lymph node biopsy.  It was decided that it would be best to perform a  combined procedure with Dr. Velora Heckler in general surgery, to address  her probable ureteral obstruction, as well as to perform her lymph node  biopsy.  In addition, her serum creatinine was noted to be mildly  elevated.  I did discuss the patient's situation in detail with Dr.  Ottie Glazier P. Livesay, and it was felt that optimizing her renal function  would be important.  Therefore after discussing options, the patient  elected to proceed with the above procedures.  The potential risks,  complications and alternative treatment options were discussed in detail  and an informed consent was obtained.   DESCRIPTION OF PROCEDURE:  The patient was taken to the operating room  and a general anesthetic was administered.  She was given  preoperative  antibiotics, placed in the dorsal lithotomy position, and prepped and  draped in the usual sterile fashion.  Next a preoperative time-out was  performed.  Cystourethroscopy was then performed which demonstrated a  normal urethra.  Systematic examination of the bladder demonstrated the  ureteral orifices to be in their normal anatomic position.  There was no  evidence of any bladder tumors, stones, or other mucosal pathology  throughout the bladder.   Attention then turned to the left ureteral orifice.  A 6-French ureteral  catheter was used to intubate the left ureteral orifice and Omnipaque  contrast was lightly injected, which demonstrated a clear area of  narrowing in the mid-ureter, with a distinct transition point and left-  sided hydronephrosis.  No obvious intrinsic filling defects were  identified.  This was felt to be most consistent with extrinsic  obstruction, likely related to her lymphoma.  A 0.038 Sensor guidewire  was then advanced up into the left renal pelvis under fluoroscopic  guidance.  A 6 x 24 double-J ureteral stent was then advanced over the  wire using Seldinger technique  and appropriately positioned under  fluoroscopic and cystoscopic guidance.  The wire was removed, with a  good curl in the renal pelvis as well as in the bladder.  The patient's  bladder was emptied and this portion of the procedure was ended.   At this time, the patient was then turned over to Dr. Gerrit Friends for his  portion of the procedure, and the remainder of this procedure will be  dictated by him.      Heloise Purpura, MD  Electronically Signed     LB/MEDQ  D:  01/30/2009  T:  01/30/2009  Job:  [id number]   cc:   Velora Heckler, MD  [address]  [place]  [place] [address]   Lennis P. Darrold Span, M.D.  Fax: [phone]

## 2011-02-23 NOTE — Op Note (Signed)
Brooke Washington, PRESCHER NO.:  [id number]   MEDICAL RECORD NO.:  [id number]          PATIENT TYPE:  AMB   LOCATION:  DAY                          [facility]ACILITY[facility]  [facility]   PHYSICIAN:  Velora Heckler, MD      DATE OF BIRTH:  1941-05-30   DATE OF PROCEDURE:  01/30/2009  DATE OF DISCHARGE:                               OPERATIVE REPORT   PREOPERATIVE DIAGNOSIS:  History of non-Hodgkin's lymphoma, left  inguinal lymphadenopathy.   POSTOPERATIVE DIAGNOSIS:  History of non-Hodgkin's lymphoma, left  inguinal lymphadenopathy.   PROCEDURE:  Left inguinal lymph node excisional biopsy.   SURGEON:  Velora Heckler, M.D., FACS   ANESTHESIA:  General.   ESTIMATED BLOOD LOSS:  Minimal.   PREPARATION:  ChloraPrep.   COMPLICATIONS:  None.   INDICATIONS:  The patient is a 70 year old white female from [place],  [place].  The patient has a known history of non-Hodgkin's  lymphoma.  A recent PET scan showed a hypermetabolic lymph node in the  left inguinal region.  CT scan showed approximately a 2.5-cm lymph node  just below the level of the inguinal ligament and superficial to the  femoral vessels.  She now comes to surgery for excisional biopsy.   BODY OF REPORT:  Procedure is done in OR #12 at the [facility].  The patient had undergone previous cystoscopy and  left stent placement by Dr. Heloise Purpura.  That is dictated under a  separate operative report.   The patient is repositioned and then prepped and draped in the usual  strict aseptic fashion.  After ascertaining that an adequate level of  anesthesia had been achieved, a left inguinal incision is made with a  #10 blade.  Dissection is carried through subcutaneous tissues.  Palpable mass overlies the femoral canal.  Lymph node is identified.  Using the electrocautery, the node is mobilized.  The lymphoid tissue  appears to involve the underlying neurovascular bundle.  The superficial  portion  of the node is excised in its entirety.  It measures  approximately 3 x 2 x 1 cm.  It is submitted fresh to pathology for  review.  Hemostasis is obtained with the electrocautery.  Surgicel is  placed at the operative site.  Subcutaneous tissues are closed in two  layers with interrupted 3-0 Vicryl sutures.  Skin is  anesthetized with local Marcaine anesthetic.  Skin is closed with a  running 4-0 Vicryl subcuticular suture.  Wound is washed and dried and  Steri-Strips are applied.  Sterile dressings are applied.  The patient  is awakened from anesthesia and brought to the recovery room in stable  condition.  The patient tolerated the procedure well.      Velora Heckler, MD  Electronically Signed     TMG/MEDQ  D:  01/30/2009  T:  01/30/2009  Job:  [id number]   cc:   Lennis P. Darrold Span, M.D.  Fax: [phone]   Valetta Mole. Swords, MD  [address] [place]  [place] [address]   Heloise Purpura, MD  Fax: [phone]

## 2011-03-09 ENCOUNTER — Encounter: Payer: Self-pay | Admitting: Internal Medicine

## 2011-03-17 ENCOUNTER — Encounter (HOSPITAL_BASED_OUTPATIENT_CLINIC_OR_DEPARTMENT_OTHER): Payer: Medicare Other | Admitting: Oncology

## 2011-03-17 ENCOUNTER — Other Ambulatory Visit: Payer: Self-pay | Admitting: Oncology

## 2011-03-17 DIAGNOSIS — C8291 Follicular lymphoma, unspecified, lymph nodes of head, face, and neck: Secondary | ICD-10-CM

## 2011-03-17 DIAGNOSIS — C8588 Other specified types of non-Hodgkin lymphoma, lymph nodes of multiple sites: Secondary | ICD-10-CM

## 2011-03-17 DIAGNOSIS — Z5112 Encounter for antineoplastic immunotherapy: Secondary | ICD-10-CM

## 2011-03-17 DIAGNOSIS — C8295 Follicular lymphoma, unspecified, lymph nodes of inguinal region and lower limb: Secondary | ICD-10-CM

## 2011-03-17 LAB — CBC WITH DIFFERENTIAL/PLATELET
BASO%: 0.7 % (ref 0.0–2.0)
EOS%: 7.4 % — ABNORMAL HIGH (ref 0.0–7.0)
MCH: 34 pg (ref 25.1–34.0)
MCV: 97 fL (ref 79.5–101.0)
MONO%: 14.9 % — ABNORMAL HIGH (ref 0.0–14.0)
RBC: 3.83 10*6/uL (ref 3.70–5.45)
RDW: 12.9 % (ref 11.2–14.5)
lymph#: 0.6 10*3/uL — ABNORMAL LOW (ref 0.9–3.3)

## 2011-03-17 LAB — COMPREHENSIVE METABOLIC PANEL
ALT: 12 U/L (ref 0–35)
AST: 20 U/L (ref 0–37)
Albumin: 4.5 g/dL (ref 3.5–5.2)
Alkaline Phosphatase: 103 U/L (ref 39–117)
Chloride: 103 mEq/L (ref 96–112)
Potassium: 3.7 mEq/L (ref 3.5–5.3)
Sodium: 142 mEq/L (ref 135–145)
Total Protein: 6.5 g/dL (ref 6.0–8.3)

## 2011-03-25 ENCOUNTER — Encounter (HOSPITAL_BASED_OUTPATIENT_CLINIC_OR_DEPARTMENT_OTHER): Payer: Medicare Other | Admitting: Oncology

## 2011-03-25 DIAGNOSIS — Z5111 Encounter for antineoplastic chemotherapy: Secondary | ICD-10-CM

## 2011-03-25 DIAGNOSIS — C8588 Other specified types of non-Hodgkin lymphoma, lymph nodes of multiple sites: Secondary | ICD-10-CM

## 2011-06-15 ENCOUNTER — Other Ambulatory Visit: Payer: Self-pay | Admitting: Oncology

## 2011-06-15 ENCOUNTER — Encounter (HOSPITAL_BASED_OUTPATIENT_CLINIC_OR_DEPARTMENT_OTHER): Payer: Medicare Other | Admitting: Oncology

## 2011-06-15 DIAGNOSIS — Z5112 Encounter for antineoplastic immunotherapy: Secondary | ICD-10-CM

## 2011-06-15 DIAGNOSIS — C8588 Other specified types of non-Hodgkin lymphoma, lymph nodes of multiple sites: Secondary | ICD-10-CM

## 2011-06-15 DIAGNOSIS — Z87891 Personal history of nicotine dependence: Secondary | ICD-10-CM

## 2011-06-15 LAB — CBC WITH DIFFERENTIAL/PLATELET
Eosinophils Absolute: 0.2 10*3/uL (ref 0.0–0.5)
HCT: 37.3 % (ref 34.8–46.6)
HGB: 13.2 g/dL (ref 11.6–15.9)
LYMPH%: 15 % (ref 14.0–49.7)
MONO#: 0.6 10*3/uL (ref 0.1–0.9)
NEUT#: 3.1 10*3/uL (ref 1.5–6.5)
NEUT%: 67.9 % (ref 38.4–76.8)
Platelets: 247 10*3/uL (ref 145–400)
WBC: 4.5 10*3/uL (ref 3.9–10.3)
lymph#: 0.7 10*3/uL — ABNORMAL LOW (ref 0.9–3.3)

## 2011-06-15 LAB — MORPHOLOGY: PLT EST: ADEQUATE

## 2011-06-15 LAB — LACTATE DEHYDROGENASE: LDH: 202 U/L (ref 94–250)

## 2011-06-15 LAB — COMPREHENSIVE METABOLIC PANEL
Albumin: 4.6 g/dL (ref 3.5–5.2)
Alkaline Phosphatase: 111 U/L (ref 39–117)
BUN: 21 mg/dL (ref 6–23)
CO2: 31 mEq/L (ref 19–32)
Calcium: 9.7 mg/dL (ref 8.4–10.5)
Glucose, Bld: 83 mg/dL (ref 70–99)
Potassium: 3.8 mEq/L (ref 3.5–5.3)

## 2011-06-22 ENCOUNTER — Encounter (HOSPITAL_BASED_OUTPATIENT_CLINIC_OR_DEPARTMENT_OTHER): Payer: Medicare Other | Admitting: Oncology

## 2011-06-22 DIAGNOSIS — Z5112 Encounter for antineoplastic immunotherapy: Secondary | ICD-10-CM

## 2011-06-22 DIAGNOSIS — C8588 Other specified types of non-Hodgkin lymphoma, lymph nodes of multiple sites: Secondary | ICD-10-CM

## 2011-09-09 ENCOUNTER — Ambulatory Visit (INDEPENDENT_AMBULATORY_CARE_PROVIDER_SITE_OTHER): Payer: Medicare Other | Admitting: Internal Medicine

## 2011-09-09 DIAGNOSIS — Z23 Encounter for immunization: Secondary | ICD-10-CM

## 2011-09-10 ENCOUNTER — Encounter: Payer: Medicare Other | Admitting: Internal Medicine

## 2011-09-10 ENCOUNTER — Ambulatory Visit: Payer: Medicare Other | Admitting: Internal Medicine

## 2011-09-13 ENCOUNTER — Other Ambulatory Visit (HOSPITAL_BASED_OUTPATIENT_CLINIC_OR_DEPARTMENT_OTHER): Payer: Medicare Other | Admitting: Lab

## 2011-09-13 ENCOUNTER — Other Ambulatory Visit: Payer: Self-pay | Admitting: Oncology

## 2011-09-13 ENCOUNTER — Ambulatory Visit (HOSPITAL_BASED_OUTPATIENT_CLINIC_OR_DEPARTMENT_OTHER): Payer: Medicare Other | Admitting: Oncology

## 2011-09-13 ENCOUNTER — Telehealth: Payer: Self-pay | Admitting: Oncology

## 2011-09-13 VITALS — BP 115/72 | HR 73 | Temp 97.3°F | Ht 64.0 in | Wt 133.1 lb

## 2011-09-13 DIAGNOSIS — C8295 Follicular lymphoma, unspecified, lymph nodes of inguinal region and lower limb: Secondary | ICD-10-CM

## 2011-09-13 DIAGNOSIS — C8589 Other specified types of non-Hodgkin lymphoma, extranodal and solid organ sites: Secondary | ICD-10-CM

## 2011-09-13 DIAGNOSIS — C8588 Other specified types of non-Hodgkin lymphoma, lymph nodes of multiple sites: Secondary | ICD-10-CM

## 2011-09-13 DIAGNOSIS — M899 Disorder of bone, unspecified: Secondary | ICD-10-CM

## 2011-09-13 DIAGNOSIS — Z87891 Personal history of nicotine dependence: Secondary | ICD-10-CM

## 2011-09-13 LAB — COMPREHENSIVE METABOLIC PANEL
AST: 18 U/L (ref 0–37)
Albumin: 4.5 g/dL (ref 3.5–5.2)
BUN: 20 mg/dL (ref 6–23)
CO2: 28 mEq/L (ref 19–32)
Calcium: 9.4 mg/dL (ref 8.4–10.5)
Chloride: 101 mEq/L (ref 96–112)
Creatinine, Ser: 0.94 mg/dL (ref 0.50–1.10)
Glucose, Bld: 75 mg/dL (ref 70–99)
Potassium: 3.8 mEq/L (ref 3.5–5.3)

## 2011-09-13 LAB — CBC WITH DIFFERENTIAL/PLATELET
BASO%: 0.5 % (ref 0.0–2.0)
Eosinophils Absolute: 0.2 10*3/uL (ref 0.0–0.5)
MONO#: 0.5 10*3/uL (ref 0.1–0.9)
NEUT#: 2.5 10*3/uL (ref 1.5–6.5)
Platelets: 256 10*3/uL (ref 145–400)
RBC: 3.93 10*6/uL (ref 3.70–5.45)
RDW: 13.2 % (ref 11.2–14.5)
WBC: 4 10*3/uL (ref 3.9–10.3)
lymph#: 0.7 10*3/uL — ABNORMAL LOW (ref 0.9–3.3)

## 2011-09-13 LAB — MORPHOLOGY: PLT EST: ADEQUATE

## 2011-09-13 LAB — LACTATE DEHYDROGENASE: LDH: 203 U/L (ref 94–250)

## 2011-09-13 NOTE — Telephone Encounter (Signed)
gv pt appt schedule for dec/march. Per pt/nurse rituxan is not q7d but once every 3months.

## 2011-09-18 NOTE — Progress Notes (Signed)
OFFICE PROGRESS NOTE INTERVAL HISTORY:  Date of Visit Sep 13, 2011 Physicians: B.Swords, L.Laverle Patter, D.Juanda Chance, D.Gottsegen  Patient is seen, together with husband, in scheduled follow up of her history of B-cell nonHodgkins lymphoma, this low grade mixed follicular and diffuse at most recent progression in spring 2010. She had initially presented early 2000 with bulky adenopathy, splenomegaly and bone marrow involvement. She has been treated to this point with chlorambucil in 2000, then with single agent Rituxan 2007 thru 09/2008. With progressive adenopathy 12/2008 in chest/ abdomen including splenomegaly/ pelvis with left ureteral obstruction requiring stent from 01/2009 thru 07/2009.  She was treated with Treanda/Rituxan 02/2009 x 5 cycles thru 06/2009. She has continued maintenance Rituxan q 3 months since then. Last CT CAP was Aug.2011 and last PET Nov 2010. She has tolerated the Rituxan with no difficulty. Brooke Washington tells me that she has been doing well since here last, with no new or different problems or concerns. She has had flu vaccine and has had no recent fevers or symptoms of infection. She has not noticed any progressive or uncomfortable adenopathy, no abdominal or pelvic pain, no bleeding, good appetite, no increased SOB or other respiratory symptoms (long past tobacco now DC'd), no neurologic symptoms.  Review of Systems: remainder of full 10 point ROS negative    Objective: Alert, pleasant and appropriate, easily mobile, husband very supportive. Vital signs in last 24 hours:  BP 115/72  Pulse 73  Temp(Src) 97.3 F (36.3 C) (Oral)  Ht 5\' 4"  (1.626 m)  Wt 133 lb 1.6 oz (60.374 kg)  BMI 22.85 kg/m2    HEENT:mucous membranes moist, pharynx normal without lesions. PERRL not icteric. LymphaticsCervical, supraclavicular, and axillary nodes normal. and no appreciable adenopathy cervical, supraclavicular, axillary, inguinal Resp: clear to auscultation bilaterally, breath sounds  somewhat diminished thruout but otherwise clear Cardio: regular rate and rhythm GI: soft, non-tender; bowel sounds normal; no masses,  no organomegaly Extremities: extremities normal, atraumatic, no cyanosis or edema Neuro: speech fluent and appropriate. No focal motor or sensory deficits Skin: minimal areas upper back where previous excoriated lesions. No rash, bruises.  Lab Results:  CBC today WBC 4.0, ANC 2.5, Hgb 13.4, MCV 97, plt 256k, 17% lymphs, no concerns on morphology review BMET/CMET  Available after visit normal with exception of alk phos 122, LDH normal at 203  Studies/Results:  No results found.  Medications: I have reviewed the patient's current medications. She has had flu vaccine this fall. Assessment/Plan:  1. History of NHL as above, clinically doing well on maintenance Rituxan due again Sep 21, 2011. I expect we will repeat scans in next 6-8 months. I will see her next at least early March with CBC/CMET/LDH or sooner if needed; she will be due Rituxan again week of March 11.  2.Long past tobacco, DC'd 2009 3.osteopenia  Patient and husband were comfortable with discussion and plan.        LIVESAY,LENNIS P, MD   09/18/2011, 11:30 AM

## 2011-09-21 ENCOUNTER — Ambulatory Visit (HOSPITAL_BASED_OUTPATIENT_CLINIC_OR_DEPARTMENT_OTHER): Payer: Medicare Other

## 2011-09-21 VITALS — BP 109/71 | HR 58 | Temp 97.8°F

## 2011-09-21 DIAGNOSIS — C8588 Other specified types of non-Hodgkin lymphoma, lymph nodes of multiple sites: Secondary | ICD-10-CM

## 2011-09-21 DIAGNOSIS — C8589 Other specified types of non-Hodgkin lymphoma, extranodal and solid organ sites: Secondary | ICD-10-CM

## 2011-09-21 DIAGNOSIS — Z5112 Encounter for antineoplastic immunotherapy: Secondary | ICD-10-CM

## 2011-09-21 MED ORDER — SODIUM CHLORIDE 0.9 % IV SOLN
375.0000 mg/m2 | Freq: Once | INTRAVENOUS | Status: AC
  Administered 2011-09-21: 600 mg via INTRAVENOUS
  Filled 2011-09-21: qty 60

## 2011-09-21 MED ORDER — SODIUM CHLORIDE 0.9 % IV SOLN
Freq: Once | INTRAVENOUS | Status: AC
  Administered 2011-09-21: 09:00:00 via INTRAVENOUS

## 2011-09-21 MED ORDER — DIPHENHYDRAMINE HCL 25 MG PO CAPS
50.0000 mg | ORAL_CAPSULE | Freq: Once | ORAL | Status: AC
  Administered 2011-09-21: 50 mg via ORAL

## 2011-09-21 MED ORDER — ACETAMINOPHEN 325 MG PO TABS
650.0000 mg | ORAL_TABLET | Freq: Once | ORAL | Status: AC
  Administered 2011-09-21: 650 mg via ORAL

## 2011-09-21 NOTE — Progress Notes (Signed)
At 0925, VSS,dnies distress, infusing increased to flow at 200 mg per hour at a rate of 105 ml/hr for a volume of 53 mls.

## 2011-09-21 NOTE — Progress Notes (Signed)
At 1025, VSS,denies distress,  Infusion increased to flow at  400 mg per hour for a rate of 210 ml/hr for a volume of 105 mls.

## 2011-09-21 NOTE — Progress Notes (Signed)
Infusion completed, VSS

## 2011-09-21 NOTE — Progress Notes (Signed)
At 0955, VSS,denies distress,  infusion increased to flow at  300 mg per hour for a rate of 172ml/hr for a volume of 79 mls.

## 2011-09-21 NOTE — Progress Notes (Signed)
Rituxan flowing at 210, VSS, denies distress.

## 2011-09-21 NOTE — Progress Notes (Signed)
At 0855, Rituxan started at  A dose of 100 mg/hr to flow at a rate of 53 mls per hour for a a volume of 26 mls.  Pt instructed to inform nurse  At once   If having difficulty breathing, sudden chills,  Or chest pain.

## 2011-10-07 NOTE — Progress Notes (Signed)
This encounter was created in error - please disregard.

## 2011-11-19 ENCOUNTER — Other Ambulatory Visit: Payer: Self-pay | Admitting: *Deleted

## 2011-11-19 MED ORDER — CITALOPRAM HYDROBROMIDE 40 MG PO TABS: 40.0000 mg | ORAL_TABLET | Freq: Every day | ORAL | Status: DC

## 2011-12-17 ENCOUNTER — Encounter: Payer: Self-pay | Admitting: Oncology

## 2011-12-17 ENCOUNTER — Other Ambulatory Visit (HOSPITAL_BASED_OUTPATIENT_CLINIC_OR_DEPARTMENT_OTHER): Payer: Medicare Other | Admitting: Lab

## 2011-12-17 ENCOUNTER — Ambulatory Visit (HOSPITAL_BASED_OUTPATIENT_CLINIC_OR_DEPARTMENT_OTHER): Payer: Medicare Other | Admitting: Oncology

## 2011-12-17 VITALS — BP 113/78 | HR 67 | Temp 96.6°F | Ht 63.0 in | Wt 134.0 lb

## 2011-12-17 DIAGNOSIS — Z720 Tobacco use: Secondary | ICD-10-CM | POA: Insufficient documentation

## 2011-12-17 DIAGNOSIS — F17200 Nicotine dependence, unspecified, uncomplicated: Secondary | ICD-10-CM

## 2011-12-17 DIAGNOSIS — C8589 Other specified types of non-Hodgkin lymphoma, extranodal and solid organ sites: Secondary | ICD-10-CM

## 2011-12-17 DIAGNOSIS — R197 Diarrhea, unspecified: Secondary | ICD-10-CM

## 2011-12-17 LAB — COMPREHENSIVE METABOLIC PANEL
ALT: <8 U/L (ref 0–35)
CO2: 32 mEq/L (ref 19–32)
Calcium: 9.7 mg/dL (ref 8.4–10.5)
Chloride: 102 mEq/L (ref 96–112)
Potassium: 3.3 mEq/L — ABNORMAL LOW (ref 3.5–5.3)
Sodium: 142 mEq/L (ref 135–145)
Total Protein: 6.3 g/dL (ref 6.0–8.3)

## 2011-12-17 LAB — CBC WITH DIFFERENTIAL/PLATELET
BASO%: 0.6 % (ref 0.0–2.0)
EOS%: 2.7 % (ref 0.0–7.0)
LYMPH%: 13.2 % — ABNORMAL LOW (ref 14.0–49.7)
MCHC: 35.2 g/dL (ref 31.5–36.0)
MONO#: 0.6 10*3/uL (ref 0.1–0.9)
RBC: 3.97 10*6/uL (ref 3.70–5.45)
WBC: 5.7 10*3/uL (ref 3.9–10.3)
lymph#: 0.8 10*3/uL — ABNORMAL LOW (ref 0.9–3.3)

## 2011-12-17 NOTE — Patient Instructions (Signed)
Call Dr. Juanda Chance if diarrhea does not completely resolve in next several days.

## 2011-12-17 NOTE — Progress Notes (Signed)
OFFICE PROGRESS NOTE Date of Visit 12-17-2011 Physicians: B.Swords,L.Borden, D.Juanda Chance, D.Gottsegen  INTERVAL HISTORY:  Patient is seen, together with husband, in continuing attention to her history of B-cell NHL, this mixed follicular and diffuse at most recent progression in spring 2010. She had initially presented early 2000 with bulky adenopathy, splenomegaly and bone marrow involvement. She has been treated to this point with chlorambucil in 2000, then with single agent Rituxan 2007 thru 09/2008. With progressive adenopathy 12/2008 in chest/ abdomen including splenomegaly/ pelvis with left ureteral obstruction requiring stent from 01/2009 thru 07/2009. She was treated with Treanda/Rituxan 02/2009 x 5 cycles thru 06/2009. She has continued maintenance Rituxan q 3 months since then. Last CT CAP was Aug.2011 and last PET Nov 2010. She has tolerated the Rituxan with no difficulty. Patient has mostly done well since she was here last, with no complaints that seem related to the lymphoma or that treatment. She did begin Prevacid again recently for GERD symptoms at hs, then developed watery diarrhea for last couple of weeks which has gradually resolved now off Prevacid x 4 days. She has had no abdominal pain, no nausea/vomiting, no fever and has pushed fluids. She has had no further GERD since stopping Prevacid. On Review of Systems otherwise: no shortness of breath or other respiratory symptoms, no pain, no skin changes, no HA, no bleeding. Remainder of 10 point Review of Systems negative.  She is still smoking 1-2 cigarettes/ 24 hours and tells me that she does not plan to stop this. We have discussed again. Objective:  Vital signs in last 24 hours:  BP 113/78  Pulse 67  Temp(Src) 96.6 F (35.9 C) (Oral)  Ht 5\' 3"  (1.6 m)  Wt 134 lb (60.782 kg)  BMI 23.74 kg/m2  Easily ambulatory, looks comfortable, very pleasant as always.  HEENT:mucous membranes moist, pharynx normal without lesions.  PERRL. LymphaticsCervical, supraclavicular, and axillary nodes normal.No clear axillary adenopathy. No inguinal adenopathy. Resp: clear to auscultation bilaterally and normal percussion bilaterally Cardio: regular rate and rhythm GI: soft, non-tender; bowel sounds normal; no masses,  no organomegaly Extremities: extremities normal, atraumatic, no cyanosis or edema Skin not remarkable  Lab Results:   [facility] 12/17/11 0921  WBC 5.7  HGB 13.4  HCT 38.0  PLT 260  ANC 4.1 lymphs 13% BMET  Basename 12/17/11 0858  NA 142  K 3.3*  CL 102  CO2 32  GLUCOSE 84  BUN 13  CREATININE 0.85  CALCIUM 9.7  AP 131, AST 17, ALT <8  Studies/Results:  No results found.  Medications: I have reviewed the patient's current medications.  Assessment/Plan:  1. B cell NHL with history as above: continuing maintenance Rituxan which will be given again 12-22-11. I will see her again at least in June, with Rituxan ~ a week after that visit. We might want to consider extending interval between treatments, tho she has been very stable on this schedule. 2.long and ongoing tobacco 3.recent diarrhea possibly related to Prevacid, improving now off the prevacid       Keilyn Haggard P, MD   12/17/2011, 9:07 PM

## 2011-12-19 ENCOUNTER — Other Ambulatory Visit: Payer: Self-pay | Admitting: Oncology

## 2011-12-22 ENCOUNTER — Ambulatory Visit: Payer: Medicare Other

## 2011-12-22 ENCOUNTER — Telehealth: Payer: Self-pay | Admitting: Oncology

## 2011-12-22 ENCOUNTER — Other Ambulatory Visit: Payer: Medicare Other

## 2011-12-22 ENCOUNTER — Ambulatory Visit (HOSPITAL_BASED_OUTPATIENT_CLINIC_OR_DEPARTMENT_OTHER): Payer: Medicare Other

## 2011-12-22 VITALS — BP 115/65 | HR 50 | Temp 97.0°F

## 2011-12-22 DIAGNOSIS — C8589 Other specified types of non-Hodgkin lymphoma, extranodal and solid organ sites: Secondary | ICD-10-CM

## 2011-12-22 DIAGNOSIS — C8588 Other specified types of non-Hodgkin lymphoma, lymph nodes of multiple sites: Secondary | ICD-10-CM

## 2011-12-22 DIAGNOSIS — Z5112 Encounter for antineoplastic immunotherapy: Secondary | ICD-10-CM

## 2011-12-22 MED ORDER — SODIUM CHLORIDE 0.9 % IV SOLN
Freq: Once | INTRAVENOUS | Status: AC
  Administered 2011-12-22: 09:00:00 via INTRAVENOUS

## 2011-12-22 MED ORDER — DIPHENHYDRAMINE HCL 25 MG PO CAPS
50.0000 mg | ORAL_CAPSULE | Freq: Once | ORAL | Status: AC
  Administered 2011-12-22: 25 mg via ORAL

## 2011-12-22 MED ORDER — SODIUM CHLORIDE 0.9 % IV SOLN
375.0000 mg/m2 | Freq: Once | INTRAVENOUS | Status: AC
  Administered 2011-12-22: 600 mg via INTRAVENOUS
  Filled 2011-12-22: qty 60

## 2011-12-22 MED ORDER — ACETAMINOPHEN 325 MG PO TABS
650.0000 mg | ORAL_TABLET | Freq: Once | ORAL | Status: AC
  Administered 2011-12-22: 650 mg via ORAL

## 2011-12-22 NOTE — Progress Notes (Signed)
Rituxan started at 0927 at rate of 100 mg/hr: 57 mL/hr X 29 mL.  Patient instructed to inform nurse if any sudden chills, shortness of breath, or chest pain. Patient expressed understanding.  1000:  Rituxan rate increased to 200 mg/hr: 114 mL/hr X 57 mL.  Patient has no complaints.  Resting comfortably  1030:  Rituxan rate increased to 300 mg/hr: 171 mL/hr X 86 mL.  Patient has no complaints, continues to rest comfortably.  1100:  Rituxan rate increased to 400 mg/hr:  229 mL/hr X 114 mL.  Patient still without complaints, continues to rest comfortably.  1130:  Rituxan rate remains 229 mL/hr X 50 mL.  No complaints, resting comfortably

## 2011-12-22 NOTE — Telephone Encounter (Signed)
Talked to pt, gave her appt for June 6th and 03/24/12

## 2011-12-22 NOTE — Telephone Encounter (Signed)
pt came by asked that we cancelled appts for april. pt stated that she is not due for another tx until june2013

## 2012-01-12 ENCOUNTER — Ambulatory Visit: Payer: Medicare Other

## 2012-02-02 ENCOUNTER — Ambulatory Visit: Payer: Medicare Other

## 2012-03-17 ENCOUNTER — Ambulatory Visit (HOSPITAL_BASED_OUTPATIENT_CLINIC_OR_DEPARTMENT_OTHER): Payer: Medicare Other | Admitting: Oncology

## 2012-03-17 ENCOUNTER — Other Ambulatory Visit (HOSPITAL_BASED_OUTPATIENT_CLINIC_OR_DEPARTMENT_OTHER): Payer: Medicare Other | Admitting: Lab

## 2012-03-17 ENCOUNTER — Telehealth: Payer: Self-pay | Admitting: *Deleted

## 2012-03-17 ENCOUNTER — Telehealth: Payer: Self-pay | Admitting: Oncology

## 2012-03-17 ENCOUNTER — Encounter: Payer: Self-pay | Admitting: Oncology

## 2012-03-17 VITALS — BP 126/76 | HR 60 | Temp 98.0°F | Ht 63.0 in | Wt 136.4 lb

## 2012-03-17 DIAGNOSIS — C8589 Other specified types of non-Hodgkin lymphoma, extranodal and solid organ sites: Secondary | ICD-10-CM

## 2012-03-17 DIAGNOSIS — Z1231 Encounter for screening mammogram for malignant neoplasm of breast: Secondary | ICD-10-CM

## 2012-03-17 DIAGNOSIS — L989 Disorder of the skin and subcutaneous tissue, unspecified: Secondary | ICD-10-CM

## 2012-03-17 DIAGNOSIS — C8299 Follicular lymphoma, unspecified, extranodal and solid organ sites: Secondary | ICD-10-CM

## 2012-03-17 LAB — COMPREHENSIVE METABOLIC PANEL
ALT: 9 U/L (ref 0–35)
Albumin: 4.4 g/dL (ref 3.5–5.2)
CO2: 29 mEq/L (ref 19–32)
Calcium: 9.6 mg/dL (ref 8.4–10.5)
Chloride: 104 mEq/L (ref 96–112)
Creatinine, Ser: 0.76 mg/dL (ref 0.50–1.10)
Potassium: 3.6 mEq/L (ref 3.5–5.3)
Sodium: 142 mEq/L (ref 135–145)
Total Protein: 6.2 g/dL (ref 6.0–8.3)

## 2012-03-17 LAB — CBC WITH DIFFERENTIAL/PLATELET
BASO%: 1.1 % (ref 0.0–2.0)
HCT: 37.2 % (ref 34.8–46.6)
HGB: 13.2 g/dL (ref 11.6–15.9)
MCHC: 35.4 g/dL (ref 31.5–36.0)
MONO#: 0.6 10*3/uL (ref 0.1–0.9)
NEUT%: 65 % (ref 38.4–76.8)
RDW: 13.2 % (ref 11.2–14.5)
WBC: 4.5 10*3/uL (ref 3.9–10.3)
lymph#: 0.7 10*3/uL — ABNORMAL LOW (ref 0.9–3.3)

## 2012-03-17 NOTE — Patient Instructions (Signed)
Hydrocortisone cream OTC bid to legs x 1 week. Will need to see dermatology if no better or if worse with this

## 2012-03-17 NOTE — Telephone Encounter (Signed)
Gave pt appt for mammogram for 04/14/12 @ [facility] , pt aware of appt, gave her appt for September, lab and MD, emailed Marcelino Duster for chemo a few days after MD visit

## 2012-03-17 NOTE — Progress Notes (Signed)
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 She had initially presented early 2000 with bulky adenopathy, splenomegaly and bone marrow involvement due to nonHodgkin's lymphoma. She has been treated to this point with chlorambucil in 2000, then with single agent Rituxan 2007 thru 09/2008. With progressive adenopathy 12/2008 in chest/ abdomen including splenomegaly/ pelvis with left ureteral obstruction requiring stent from 01/2009 thru 07/2009. She was treated with Treanda/Rituxan 02/2009 x 5 cycles thru 06/2009. She has continued maintenance Rituxan q 3 months since then. Last CT CAP was Aug.2011 and last PET Nov 2010. She has tolerated the Rituxan with no difficulty, last given 12-22-11. She is drowsy for ~ 24 hours on the reduced dose of benadryl, however this seems preferable to reducing that dose further and increasing risk of reaction to the rituxan. Patient has felt well since she was seen last, with no noted adenopathy, no fevers or sweats, no symptoms of infection, no abdominal pain, no increased shortness of breath or other respiratory symptoms. She has had puritic areas on lower legs bilaterally x several weeks, initially thought to be insect bites. Appetite is good and energy is at baseline. She is due mammograms, which we will set up today. Remainder of 10 point Review of Systems negative.  Objective:  Vital signs in last 24 hours:  BP 126/76  Pulse 60  Temp(Src) 98 F (36.7 C) (Oral)  Ht 5\' 3"  (1.6 m)  Wt 136 lb 6.4 oz (61.871 kg)  BMI 24.16 kg/m2 Weight is up 2.5 lbs. Easily ambulatory, very pleasant and looks comfortable.   HEENT:mucous membranes moist, pharynx normal without lesions LymphaticsCervical, supraclavicular, and  axillary nodes normal. and no cervical or supraclavicular adenopathy and no inguinal adenopathy. No axillary adenopathy appreciated, tho she was not completely undressed.  Resp: clear to auscultation bilaterally and normal percussion bilaterally Cardio: regular rate and rhythm GI: soft, non-tender; bowel sounds normal; no masses,  no organomegaly Extremities: no pitting edema, cords or tenderness. Neuro:nonfocal Skin on lower legs bilaterally with multiple slightly discolored areas up to 0.5 cm, some with excoriations, no suprainfection, no vesicles. Diminished hair on LE and skin looks thin with prominent veins.  Lab Results:   [facility] 03/17/12 0929  WBC 4.5  HGB 13.2  HCT 37.2  PLT 235  ANC 2.9 lymphs 0.7 BMET  Basename 03/17/12 0929  NA 142  K 3.6  CL 104  CO2 29  GLUCOSE 84  BUN 11  CREATININE 0.76  CALCIUM 9.6   Remainder of CMET normal with exception of AP 123, this stable x 6 mo Studies/Results:  Mammograms ordered, last 03-01-11 at [facility]  Medications: I have reviewed the patient's current medications.  She continues to smoke a couple of cigarettes/24 hrs and is not interested in or actively trying to stop. Assessment/Plan: 1.nonHodgkins lymphoma: clinically doing well on maintenance Rituxan, which we will continue. She will have treatment on 03-24-12 with same lower dose benadryl. I will see her back in 3 mo and set up next treatment from there. I do not think rescanning is necessary just now. 2.overdue mammograms, which will be set up 3.ongoing minimal tobacco, with long history 4.skin lesions on lower extremities: she is to try OTC hydrocortisone bid x l week and will let MD know if not improved.  Patient and husband comfortable with discussion and plan.    Brooke Juneau P, MD   03/17/2012, 9:52 PM

## 2012-03-17 NOTE — Telephone Encounter (Signed)
Per staff message from Rose, I have scheduled treatment appt.  JMW  

## 2012-03-19 ENCOUNTER — Other Ambulatory Visit: Payer: Self-pay | Admitting: Oncology

## 2012-03-24 ENCOUNTER — Ambulatory Visit (HOSPITAL_BASED_OUTPATIENT_CLINIC_OR_DEPARTMENT_OTHER): Payer: Medicare Other

## 2012-03-24 VITALS — BP 102/65 | HR 57 | Temp 97.3°F

## 2012-03-24 DIAGNOSIS — C8589 Other specified types of non-Hodgkin lymphoma, extranodal and solid organ sites: Secondary | ICD-10-CM

## 2012-03-24 DIAGNOSIS — Z5112 Encounter for antineoplastic immunotherapy: Secondary | ICD-10-CM

## 2012-03-24 MED ORDER — SODIUM CHLORIDE 0.9 % IV SOLN
375.0000 mg/m2 | Freq: Once | INTRAVENOUS | Status: AC
  Administered 2012-03-24: 600 mg via INTRAVENOUS
  Filled 2012-03-24: qty 60

## 2012-03-24 MED ORDER — DIPHENHYDRAMINE HCL 25 MG PO CAPS
25.0000 mg | ORAL_CAPSULE | Freq: Once | ORAL | Status: AC
  Administered 2012-03-24: 25 mg via ORAL

## 2012-03-24 MED ORDER — ACETAMINOPHEN 325 MG PO TABS
650.0000 mg | ORAL_TABLET | Freq: Once | ORAL | Status: AC
  Administered 2012-03-24: 650 mg via ORAL

## 2012-03-24 MED ORDER — SODIUM CHLORIDE 0.9 % IV SOLN
Freq: Once | INTRAVENOUS | Status: AC
  Administered 2012-03-24: 09:00:00 via INTRAVENOUS

## 2012-03-24 NOTE — Patient Instructions (Addendum)
[facility] Discharge Instructions for Patients Receiving Chemotherapy  Today you received the following chemotherapy agents Rituxan   To help prevent nausea and vomiting after your treatment, we encourage you to take your nausea medication as prescribed If you develop nausea and vomiting that is not controlled by your nausea medication, call the clinic. If it is after clinic hours your family physician or the after hours number for the clinic or go to the Emergency Department.   BELOW ARE SYMPTOMS THAT SHOULD BE REPORTED IMMEDIATELY:  *FEVER GREATER THAN 100.5 F  *CHILLS WITH OR WITHOUT FEVER  NAUSEA AND VOMITING THAT IS NOT CONTROLLED WITH YOUR NAUSEA MEDICATION  *UNUSUAL SHORTNESS OF BREATH  *UNUSUAL BRUISING OR BLEEDING  TENDERNESS IN MOUTH AND THROAT WITH OR WITHOUT PRESENCE OF ULCERS  *URINARY PROBLEMS  *BOWEL PROBLEMS  UNUSUAL RASH Items with * indicate a potential emergency and should be followed up as soon as possible.  One of the nurses will contact you 24 hours after your treatment. Please let the nurse know about any problems that you may have experienced. Feel free to call the clinic you have any questions or concerns. The clinic phone number is [phone].   I have been informed and understand all the instructions given to me. I know to contact the clinic, my physician, or go to the Emergency Department if any problems should occur. I do not have any questions at this time, but understand that I may call the clinic during office hours   should I have any questions or need assistance in obtaining follow up care.    __________________________________________  _____________  __________ Signature of Patient or Authorized Representative            Date                   Time    __________________________________________ Nurse's Signature    

## 2012-03-27 ENCOUNTER — Telehealth: Payer: Self-pay | Admitting: Oncology

## 2012-03-27 NOTE — Telephone Encounter (Signed)
Pt aware of appt in September 2013 lab, MD and chemo

## 2012-05-15 ENCOUNTER — Other Ambulatory Visit: Payer: Self-pay

## 2012-05-15 ENCOUNTER — Other Ambulatory Visit: Payer: Self-pay | Admitting: Internal Medicine

## 2012-05-15 MED ORDER — CITALOPRAM HYDROBROMIDE 40 MG PO TABS: 40.0000 mg | ORAL_TABLET | Freq: Every day | ORAL | Status: DC

## 2012-05-15 NOTE — Telephone Encounter (Signed)
Rx called in as directed.   

## 2012-05-15 NOTE — Telephone Encounter (Signed)
Request for Celexa 40 mg #90 ok to refill?

## 2012-06-16 ENCOUNTER — Telehealth: Payer: Self-pay | Admitting: Oncology

## 2012-06-16 ENCOUNTER — Encounter: Payer: Self-pay | Admitting: Oncology

## 2012-06-16 ENCOUNTER — Ambulatory Visit (HOSPITAL_BASED_OUTPATIENT_CLINIC_OR_DEPARTMENT_OTHER): Payer: Medicare Other | Admitting: Oncology

## 2012-06-16 ENCOUNTER — Other Ambulatory Visit (HOSPITAL_BASED_OUTPATIENT_CLINIC_OR_DEPARTMENT_OTHER): Payer: Medicare Other | Admitting: Lab

## 2012-06-16 VITALS — BP 98/67 | HR 97 | Temp 98.3°F | Resp 18 | Ht 64.0 in | Wt 131.3 lb

## 2012-06-16 DIAGNOSIS — C8298 Follicular lymphoma, unspecified, lymph nodes of multiple sites: Secondary | ICD-10-CM

## 2012-06-16 DIAGNOSIS — C8589 Other specified types of non-Hodgkin lymphoma, extranodal and solid organ sites: Secondary | ICD-10-CM

## 2012-06-16 DIAGNOSIS — F17200 Nicotine dependence, unspecified, uncomplicated: Secondary | ICD-10-CM

## 2012-06-16 LAB — CBC WITH DIFFERENTIAL/PLATELET
Basophils Absolute: 0.1 10*3/uL (ref 0.0–0.1)
Eosinophils Absolute: 0.2 10*3/uL (ref 0.0–0.5)
HGB: 13.8 g/dL (ref 11.6–15.9)
NEUT#: 2.9 10*3/uL (ref 1.5–6.5)
RDW: 13 % (ref 11.2–14.5)
lymph#: 0.8 10*3/uL — ABNORMAL LOW (ref 0.9–3.3)

## 2012-06-16 LAB — COMPREHENSIVE METABOLIC PANEL (CC13)
Albumin: 4.2 g/dL (ref 3.5–5.0)
BUN: 14 mg/dL (ref 7.0–26.0)
CO2: 28 mEq/L (ref 22–29)
Calcium: 9.7 mg/dL (ref 8.4–10.4)
Chloride: 103 mEq/L (ref 98–107)
Glucose: 80 mg/dl (ref 70–99)
Potassium: 3.6 mEq/L (ref 3.5–5.1)

## 2012-06-16 NOTE — Telephone Encounter (Signed)
Gave pt appt calendar for December 2013 lab, chemo and MD

## 2012-06-16 NOTE — Progress Notes (Signed)
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 She has been clinically stable since that treatment on maintenance rituxan every 3 months. Last Rituxan was 03-23-12.  She had initially presented early 2000 with bulky adenopathy, splenomegaly and bone marrow involvement. She has been treated to this point with chlorambucil in 2000, then with single agent Rituxan 2007 thru 09/2008. With progressive adenopathy 12/2008 in chest/ abdomen including splenomegaly/ pelvis with left ureteral obstruction requiring stent from 01/2009 thru 07/2009. She was treated with Treanda/Rituxan 02/2009 x 5 cycles thru 06/2009. She has continued maintenance Rituxan q 3 months since then. Last CT CAP was Aug.2011 and last PET Nov 2010. She has tolerated the Rituxan with no difficulty.  Patient has had no fever or symptoms of infection since she was here last. She is not aware of any change in adenopathy, or any abdominal discomfort or shortness of breath. She denies pain. Energy is good. She and husband are now essentially vegetarian, which seems to account for the slight weight decrease. Remainder of 10 point Review of Systems negative.  Objective:  Vital signs in last 24 hours:  BP 98/67  Pulse 97  Temp 98.3 F (36.8 C) (Oral)  Resp 18  Ht 5\' 4"  (1.626 m)  Wt 131 lb 4.8 oz (59.557 kg)  BMI 22.54 kg/m2 weight is down ~ 3 lbs. Easily ambulatory, looks comfortable, respirations not labored RA.    HEENT:PERRLA, sclera clear, anicteric and oropharynx clear, no lesions. Normal hair pattern. LymphaticsCervical, supraclavicular, and axillary nodes normal. No inguinal adenopathy appreciated. Resp: clear to auscultation bilaterally and normal percussion bilaterally Cardio: regular rate and  rhythm GI: soft, non-tender; bowel sounds normal; no masses,  no organomegaly Extremities: extremities normal, atraumatic, no cyanosis or edema Neuro:no sensory deficits noted Skin without rash or ecchymosis  Lab Results:  Results for orders placed in visit on 06/16/12  CBC WITH DIFFERENTIAL      Component Value Range   WBC 4.5  3.9 - 10.3 10e3/uL   NEUT# 2.9  1.5 - 6.5 10e3/uL   HGB 13.8  11.6 - 15.9 g/dL   HCT 78.2  95.6 - 21.3 %   Platelets 267  145 - 400 10e3/uL   MCV 94.5  79.5 - 101.0 fL   MCH 33.1  25.1 - 34.0 pg   MCHC 35.0  31.5 - 36.0 g/dL   RBC 0.86  5.78 - 4.69 10e6/uL   RDW 13.0  11.2 - 14.5 %   lymph# 0.8 (*) 0.9 - 3.3 10e3/uL   MONO# 0.6  0.1 - 0.9 10e3/uL   Eosinophils Absolute 0.2  0.0 - 0.5 10e3/uL   Basophils Absolute 0.1  0.0 - 0.1 10e3/uL   NEUT% 64.0  38.4 - 76.8 %   LYMPH% 17.8  14.0 - 49.7 %   MONO% 12.4  0.0 - 14.0 %   EOS% 4.7  0.0 - 7.0 %   BASO% 1.1  0.0 - 2.0 %    CMET available after visit entirely normal. LDH also available after visit 253. Studies/Results:  Mammograms at [facility] 04-14-12 had no findings of concern and no mention of axillary adenopathy. Medications: I have reviewed the patient's current medications.  Assessment/Plan:  1. B cell NHL with history as above: continuing maintenance Rituxan which will be given again  06-23-12 as she has done so well on this schedule. I will see her in 3 months, shortly prior to next rituxan, or sooner if needed. She is aware that she needs flu shot this fall 2.long and ongoing tobacco: has cut back to 1-2 cigarettes/ 24 hrs, but has not been interested in stopping entirely.   LIVESAY,LENNIS P, MD   06/16/2012, 9:46 AM

## 2012-06-16 NOTE — Patient Instructions (Signed)
Flu shot this fall.

## 2012-06-16 NOTE — Telephone Encounter (Signed)
Gave pt appt for December 2013 lab, md and chemo

## 2012-06-19 ENCOUNTER — Other Ambulatory Visit: Payer: Self-pay | Admitting: Oncology

## 2012-06-21 ENCOUNTER — Telehealth: Payer: Self-pay | Admitting: *Deleted

## 2012-06-21 ENCOUNTER — Other Ambulatory Visit: Payer: Self-pay | Admitting: Oncology

## 2012-06-21 NOTE — Telephone Encounter (Signed)
I have called and moved appt from 845 am to 9am on 9/13 due to staff message. Patient aware.  JMW

## 2012-06-23 ENCOUNTER — Ambulatory Visit (HOSPITAL_BASED_OUTPATIENT_CLINIC_OR_DEPARTMENT_OTHER): Payer: Medicare Other

## 2012-06-23 VITALS — BP 107/71 | HR 65 | Temp 96.8°F | Resp 16

## 2012-06-23 DIAGNOSIS — Z5112 Encounter for antineoplastic immunotherapy: Secondary | ICD-10-CM

## 2012-06-23 DIAGNOSIS — C8589 Other specified types of non-Hodgkin lymphoma, extranodal and solid organ sites: Secondary | ICD-10-CM

## 2012-06-23 MED ORDER — DIPHENHYDRAMINE HCL 25 MG PO CAPS
25.0000 mg | ORAL_CAPSULE | Freq: Once | ORAL | Status: AC
  Administered 2012-06-23: 25 mg via ORAL

## 2012-06-23 MED ORDER — SODIUM CHLORIDE 0.9 % IV SOLN
375.0000 mg/m2 | Freq: Once | INTRAVENOUS | Status: AC
  Administered 2012-06-23: 600 mg via INTRAVENOUS
  Filled 2012-06-23: qty 60

## 2012-06-23 MED ORDER — ACETAMINOPHEN 325 MG PO TABS
650.0000 mg | ORAL_TABLET | Freq: Once | ORAL | Status: AC
  Administered 2012-06-23: 650 mg via ORAL

## 2012-06-23 MED ORDER — SODIUM CHLORIDE 0.9 % IV SOLN
Freq: Once | INTRAVENOUS | Status: AC
  Administered 2012-06-23: 09:00:00 via INTRAVENOUS

## 2012-06-23 NOTE — Patient Instructions (Signed)
[facility] Discharge Instructions for Patients Receiving Chemotherapy  Today you received the following chemotherapy agents Rituxan   To help prevent nausea and vomiting after your treatment, we encourage you to take your nausea medication as prescribed If you develop nausea and vomiting that is not controlled by your nausea medication, call the clinic. If it is after clinic hours your family physician or the after hours number for the clinic or go to the Emergency Department.   BELOW ARE SYMPTOMS THAT SHOULD BE REPORTED IMMEDIATELY:  *FEVER GREATER THAN 100.5 F  *CHILLS WITH OR WITHOUT FEVER  NAUSEA AND VOMITING THAT IS NOT CONTROLLED WITH YOUR NAUSEA MEDICATION  *UNUSUAL SHORTNESS OF BREATH  *UNUSUAL BRUISING OR BLEEDING  TENDERNESS IN MOUTH AND THROAT WITH OR WITHOUT PRESENCE OF ULCERS  *URINARY PROBLEMS  *BOWEL PROBLEMS  UNUSUAL RASH Items with * indicate a potential emergency and should be followed up as soon as possible.  One of the nurses will contact you 24 hours after your treatment. Please let the nurse know about any problems that you may have experienced. Feel free to call the clinic you have any questions or concerns. The clinic phone number is [phone].   I have been informed and understand all the instructions given to me. I know to contact the clinic, my physician, or go to the Emergency Department if any problems should occur. I do not have any questions at this time, but understand that I may call the clinic during office hours   should I have any questions or need assistance in obtaining follow up care.    __________________________________________  _____________  __________ Signature of Patient or Authorized Representative            Date                   Time    __________________________________________ Nurse's Signature    

## 2012-07-07 ENCOUNTER — Ambulatory Visit (INDEPENDENT_AMBULATORY_CARE_PROVIDER_SITE_OTHER): Payer: Medicare Other

## 2012-07-07 DIAGNOSIS — Z23 Encounter for immunization: Secondary | ICD-10-CM

## 2012-09-15 ENCOUNTER — Other Ambulatory Visit: Payer: Medicare Other | Admitting: Lab

## 2012-09-17 ENCOUNTER — Other Ambulatory Visit: Payer: Self-pay | Admitting: Oncology

## 2012-09-18 ENCOUNTER — Other Ambulatory Visit (HOSPITAL_BASED_OUTPATIENT_CLINIC_OR_DEPARTMENT_OTHER): Payer: Medicare Other | Admitting: Lab

## 2012-09-18 ENCOUNTER — Telehealth: Payer: Self-pay | Admitting: *Deleted

## 2012-09-18 ENCOUNTER — Encounter: Payer: Self-pay | Admitting: Oncology

## 2012-09-18 ENCOUNTER — Ambulatory Visit (HOSPITAL_BASED_OUTPATIENT_CLINIC_OR_DEPARTMENT_OTHER): Payer: Medicare Other | Admitting: Oncology

## 2012-09-18 ENCOUNTER — Telehealth: Payer: Self-pay | Admitting: Oncology

## 2012-09-18 ENCOUNTER — Ambulatory Visit: Payer: Medicare Other | Admitting: Oncology

## 2012-09-18 VITALS — BP 118/78 | HR 63 | Temp 96.7°F | Resp 18 | Ht 64.0 in | Wt 133.0 lb

## 2012-09-18 DIAGNOSIS — F17200 Nicotine dependence, unspecified, uncomplicated: Secondary | ICD-10-CM

## 2012-09-18 DIAGNOSIS — C8589 Other specified types of non-Hodgkin lymphoma, extranodal and solid organ sites: Secondary | ICD-10-CM

## 2012-09-18 DIAGNOSIS — C8298 Follicular lymphoma, unspecified, lymph nodes of multiple sites: Secondary | ICD-10-CM

## 2012-09-18 LAB — CBC WITH DIFFERENTIAL/PLATELET
Basophils Absolute: 0 10*3/uL (ref 0.0–0.1)
Eosinophils Absolute: 0.2 10*3/uL (ref 0.0–0.5)
HCT: 38.9 % (ref 34.8–46.6)
HGB: 13.5 g/dL (ref 11.6–15.9)
MCH: 33.6 pg (ref 25.1–34.0)
MONO#: 0.7 10*3/uL (ref 0.1–0.9)
NEUT#: 4.9 10*3/uL (ref 1.5–6.5)
NEUT%: 72.2 % (ref 38.4–76.8)
WBC: 6.8 10*3/uL (ref 3.9–10.3)
lymph#: 0.9 10*3/uL (ref 0.9–3.3)

## 2012-09-18 LAB — COMPREHENSIVE METABOLIC PANEL (CC13)
Albumin: 3.9 g/dL (ref 3.5–5.0)
BUN: 11 mg/dL (ref 7.0–26.0)
CO2: 29 mEq/L (ref 22–29)
Calcium: 9.6 mg/dL (ref 8.4–10.4)
Chloride: 104 mEq/L (ref 98–107)
Creatinine: 0.8 mg/dL (ref 0.6–1.1)
Glucose: 86 mg/dl (ref 70–99)

## 2012-09-18 NOTE — Patient Instructions (Signed)
Zyrtec or claritin/ lortadine daily next few days. Let MD know if this does not resolve the respiratory symptoms.

## 2012-09-18 NOTE — Progress Notes (Signed)
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  She had initially presented early 2000 with bulky adenopathy, splenomegaly and bone marrow involvement. She has been treated to this point with chlorambucil in 2000, then with single agent Rituxan 2007 thru 09/2008. With progressive adenopathy 12/2008 in chest/ abdomen including splenomegaly/ pelvis with left ureteral obstruction requiring stent from 01/2009 thru 07/2009. She was treated with Treanda/Rituxan 02/2009 x 5 cycles thru 06/2009. She has continued maintenance Rituxan q 3 months since then. Last CT CAP was Aug.2011 and last PET Nov 2010. She has tolerated the Rituxan with no difficulty.  Patient has had upper respiratory congestion with clear drainage and cough with clear secretions for past 4-6 weeks, without fever, shortness of breath or chest pain. She is not using any medication for environmental allergies, has tried Robitussin DM only. She has had no other infectious symptoms, no pain, good appetite, no known peripheral adenopathy. Remainder of 10 point Review of Systems negative.  Objective:  Vital signs in last 24 hours:  BP 118/78  Pulse 63  Temp 96.7 F (35.9 C) (Oral)  Resp 18  Ht 5\' 4"  (1.626 m)  Wt 133 lb (60.328 kg)  BMI 22.83 kg/m2 Weight is up 2 lbs. Easily ambulatory, does not appear ill, no cough during visit, sounds slightly nasally congested.  HEENT:PERRLA, sclera clear, anicteric and neck supple with midline trachea Posterior pharynx with dull erythema/ no exudate bilaterally consistent with post nasal drainage. Nasal turbinates boggy without purulence LymphaticsCervical, supraclavicular, and axillary nodes normal. No inguinal adenopathy Resp: hyperresonant to percussion, no  wheezes or rales, diminished BS symmetrically Cardio: regular rate and rhythm GI: soft, non-tender; bowel sounds normal; no masses,  no organomegaly Extremities: extremities normal, atraumatic, no cyanosis or edema Neuro:no sensory deficits noted Skin with 2 small excoriated areas right posterior scapula, no rash or ecchymosis  Lab Results:  Results for orders placed in visit on 09/18/12  CBC WITH DIFFERENTIAL      Component Value Range   WBC 6.8  3.9 - 10.3 10e3/uL   NEUT# 4.9  1.5 - 6.5 10e3/uL   HGB 13.5  11.6 - 15.9 g/dL   HCT 08.6  57.8 - 46.9 %   Platelets 267  145 - 400 10e3/uL   MCV 96.6  79.5 - 101.0 fL   MCH 33.6  25.1 - 34.0 pg   MCHC 34.8  31.5 - 36.0 g/dL   RBC 6.29  5.28 - 4.13 10e6/uL   RDW 13.0  11.2 - 14.5 %   lymph# 0.9  0.9 - 3.3 10e3/uL   MONO# 0.7  0.1 - 0.9 10e3/uL   Eosinophils Absolute 0.2  0.0 - 0.5 10e3/uL   Basophils Absolute 0.0  0.0 - 0.1 10e3/uL   NEUT% 72.2  38.4 - 76.8 %   LYMPH% 13.3 (*) 14.0 - 49.7 %   MONO% 10.5  0.0 - 14.0 %   EOS% 3.4  0.0 - 7.0 %   BASO% 0.6  0.0 - 2.0 %    CMET available after visit entirely normal Studies/Results:  No results found.  Medications: I have reviewed the patient's current medications. She has had flu vaccine.  Assessment/Plan: 1.. B cell NHL with history as above: continuing maintenance Rituxan with next administration planned 09-22-12. I will  see her in 3 months, shortly prior to next rituxan, or sooner if needed.  2. Respiratory symptoms that seem most likely environmental allergies. She will use claritin or zyrtec for next several days and let MD know if this does not resolve. 3.long and ongoing tobacco, which she has decreased significantly but will not stop. 4.flu vaccine done  Patient and husband are comfortable with discussion and plan above.   [place] Kyler P, MD   09/18/2012, 9:43 AM

## 2012-09-18 NOTE — Telephone Encounter (Signed)
Per staff phone call and POF I have scheduled appt. JMW  

## 2012-09-18 NOTE — Telephone Encounter (Signed)
gv and printed pt appt schedule for March 2014 °

## 2012-09-22 ENCOUNTER — Ambulatory Visit (HOSPITAL_BASED_OUTPATIENT_CLINIC_OR_DEPARTMENT_OTHER): Payer: Medicare Other

## 2012-09-22 ENCOUNTER — Ambulatory Visit: Payer: Medicare Other | Admitting: Oncology

## 2012-09-22 VITALS — BP 96/66 | HR 88 | Temp 97.6°F | Resp 16

## 2012-09-22 DIAGNOSIS — C8589 Other specified types of non-Hodgkin lymphoma, extranodal and solid organ sites: Secondary | ICD-10-CM

## 2012-09-22 DIAGNOSIS — C8298 Follicular lymphoma, unspecified, lymph nodes of multiple sites: Secondary | ICD-10-CM

## 2012-09-22 DIAGNOSIS — Z5112 Encounter for antineoplastic immunotherapy: Secondary | ICD-10-CM

## 2012-09-22 MED ORDER — ACETAMINOPHEN 325 MG PO TABS
650.0000 mg | ORAL_TABLET | Freq: Once | ORAL | Status: AC
  Administered 2012-09-22: 650 mg via ORAL

## 2012-09-22 MED ORDER — SODIUM CHLORIDE 0.9 % IV SOLN
375.0000 mg/m2 | Freq: Once | INTRAVENOUS | Status: AC
  Administered 2012-09-22: 600 mg via INTRAVENOUS
  Filled 2012-09-22: qty 60

## 2012-09-22 MED ORDER — SODIUM CHLORIDE 0.9 % IV SOLN
Freq: Once | INTRAVENOUS | Status: AC
  Administered 2012-09-22: 10:00:00 via INTRAVENOUS

## 2012-09-22 MED ORDER — DIPHENHYDRAMINE HCL 25 MG PO CAPS
25.0000 mg | ORAL_CAPSULE | Freq: Once | ORAL | Status: AC
  Administered 2012-09-22: 25 mg via ORAL

## 2012-09-22 NOTE — Patient Instructions (Addendum)
[facility] Discharge Instructions for Patients Receiving Chemotherapy  Today you received the following chemotherapy agents Rituxan  To help prevent nausea and vomiting after your treatment, we encourage you to take your nausea medication as prescribed.   If you develop nausea and vomiting that is not controlled by your nausea medication, call the clinic. If it is after clinic hours your family physician or the after hours number for the clinic or go to the Emergency Department.   BELOW ARE SYMPTOMS THAT SHOULD BE REPORTED IMMEDIATELY:  *FEVER GREATER THAN 100.5 F  *CHILLS WITH OR WITHOUT FEVER  NAUSEA AND VOMITING THAT IS NOT CONTROLLED WITH YOUR NAUSEA MEDICATION  *UNUSUAL SHORTNESS OF BREATH  *UNUSUAL BRUISING OR BLEEDING  TENDERNESS IN MOUTH AND THROAT WITH OR WITHOUT PRESENCE OF ULCERS  *URINARY PROBLEMS  *BOWEL PROBLEMS  UNUSUAL RASH Items with * indicate a potential emergency and should be followed up as soon as possible.  Feel free to call the clinic you have any questions or concerns. The clinic phone number is [phone].   I have been informed and understand all the instructions given to me. I know to contact the clinic, my physician, or go to the Emergency Department if any problems should occur. I do not have any questions at this time, but understand that I may call the clinic during office hours   should I have any questions or need assistance in obtaining follow up care.    

## 2012-12-13 ENCOUNTER — Telehealth: Payer: Self-pay | Admitting: Oncology

## 2012-12-16 ENCOUNTER — Other Ambulatory Visit: Payer: Self-pay | Admitting: Oncology

## 2012-12-18 ENCOUNTER — Other Ambulatory Visit: Payer: Medicare Other | Admitting: Lab

## 2012-12-18 ENCOUNTER — Ambulatory Visit: Payer: Medicare Other | Admitting: Oncology

## 2012-12-20 ENCOUNTER — Telehealth: Payer: Self-pay | Admitting: Oncology

## 2012-12-20 ENCOUNTER — Other Ambulatory Visit: Payer: Self-pay | Admitting: Oncology

## 2012-12-20 ENCOUNTER — Ambulatory Visit (HOSPITAL_BASED_OUTPATIENT_CLINIC_OR_DEPARTMENT_OTHER): Payer: Medicare Other | Admitting: Physician Assistant

## 2012-12-20 ENCOUNTER — Other Ambulatory Visit (HOSPITAL_BASED_OUTPATIENT_CLINIC_OR_DEPARTMENT_OTHER): Payer: Medicare Other | Admitting: Lab

## 2012-12-20 ENCOUNTER — Encounter: Payer: Self-pay | Admitting: Physician Assistant

## 2012-12-20 VITALS — BP 122/69 | HR 66 | Temp 97.0°F | Resp 20 | Ht 64.0 in | Wt 129.5 lb

## 2012-12-20 DIAGNOSIS — C8589 Other specified types of non-Hodgkin lymphoma, extranodal and solid organ sites: Secondary | ICD-10-CM

## 2012-12-20 DIAGNOSIS — F17200 Nicotine dependence, unspecified, uncomplicated: Secondary | ICD-10-CM

## 2012-12-20 LAB — COMPREHENSIVE METABOLIC PANEL (CC13)
AST: 15 U/L (ref 5–34)
Alkaline Phosphatase: 112 U/L (ref 40–150)
BUN: 11.2 mg/dL (ref 7.0–26.0)
Creatinine: 0.8 mg/dL (ref 0.6–1.1)
Total Bilirubin: 0.38 mg/dL (ref 0.20–1.20)

## 2012-12-20 LAB — CBC WITH DIFFERENTIAL/PLATELET
Basophils Absolute: 0.1 10*3/uL (ref 0.0–0.1)
EOS%: 1.9 % (ref 0.0–7.0)
HCT: 36.7 % (ref 34.8–46.6)
HGB: 12.7 g/dL (ref 11.6–15.9)
MCH: 32.8 pg (ref 25.1–34.0)
MCV: 94.9 fL (ref 79.5–101.0)
MONO%: 10 % (ref 0.0–14.0)
NEUT%: 69.6 % (ref 38.4–76.8)
RDW: 13.3 % (ref 11.2–14.5)

## 2012-12-20 NOTE — Telephone Encounter (Signed)
Gave pt appt for June 2014 lab, Md and chemo

## 2012-12-22 ENCOUNTER — Ambulatory Visit (HOSPITAL_BASED_OUTPATIENT_CLINIC_OR_DEPARTMENT_OTHER): Payer: Medicare Other

## 2012-12-22 VITALS — BP 101/67 | HR 51 | Temp 97.5°F | Resp 18

## 2012-12-22 DIAGNOSIS — Z5112 Encounter for antineoplastic immunotherapy: Secondary | ICD-10-CM

## 2012-12-22 DIAGNOSIS — C8589 Other specified types of non-Hodgkin lymphoma, extranodal and solid organ sites: Secondary | ICD-10-CM

## 2012-12-22 MED ORDER — SODIUM CHLORIDE 0.9 % IJ SOLN
10.0000 mL | INTRAMUSCULAR | Status: DC | PRN
  Administered 2012-12-22: 10 mL
  Filled 2012-12-22: qty 10

## 2012-12-22 MED ORDER — SODIUM CHLORIDE 0.9 % IV SOLN
375.0000 mg/m2 | Freq: Once | INTRAVENOUS | Status: AC
  Administered 2012-12-22: 600 mg via INTRAVENOUS
  Filled 2012-12-22: qty 60

## 2012-12-22 MED ORDER — DIPHENHYDRAMINE HCL 25 MG PO CAPS
25.0000 mg | ORAL_CAPSULE | Freq: Once | ORAL | Status: AC
  Administered 2012-12-22: 25 mg via ORAL

## 2012-12-22 MED ORDER — HEPARIN SOD (PORK) LOCK FLUSH 100 UNIT/ML IV SOLN
500.0000 [IU] | Freq: Once | INTRAVENOUS | Status: AC | PRN
  Administered 2012-12-22: 500 [IU]
  Filled 2012-12-22: qty 5

## 2012-12-22 MED ORDER — ACETAMINOPHEN 325 MG PO TABS
650.0000 mg | ORAL_TABLET | Freq: Once | ORAL | Status: AC
  Administered 2012-12-22: 650 mg via ORAL

## 2012-12-22 MED ORDER — SODIUM CHLORIDE 0.9 % IV SOLN
Freq: Once | INTRAVENOUS | Status: AC
  Administered 2012-12-22: 09:00:00 via INTRAVENOUS

## 2012-12-22 NOTE — Patient Instructions (Addendum)
[facility] Discharge Instructions for Patients Receiving Chemotherapy  Today you received the following chemotherapy agents Rituxan  To help prevent nausea and vomiting after your treatment, we encourage you to take your nausea medication As Directed.   If you develop nausea and vomiting that is not controlled by your nausea medication, call the clinic. If it is after clinic hours your family physician or the after hours number for the clinic or go to the Emergency Department.   BELOW ARE SYMPTOMS THAT SHOULD BE REPORTED IMMEDIATELY:  *FEVER GREATER THAN 100.5 F  *CHILLS WITH OR WITHOUT FEVER  NAUSEA AND VOMITING THAT IS NOT CONTROLLED WITH YOUR NAUSEA MEDICATION  *UNUSUAL SHORTNESS OF BREATH  *UNUSUAL BRUISING OR BLEEDING  TENDERNESS IN MOUTH AND THROAT WITH OR WITHOUT PRESENCE OF ULCERS  *URINARY PROBLEMS  *BOWEL PROBLEMS  UNUSUAL RASH Items with * indicate a potential emergency and should be followed up as soon as possible.  One of the nurses will contact you 24 hours after your treatment. Please let the nurse know about any problems that you may have experienced. Feel free to call the clinic you have any questions or concerns. The clinic phone number is [phone].   I have been informed and understand all the instructions given to me. I know to contact the clinic, my physician, or go to the Emergency Department if any problems should occur. I do not have any questions at this time, but understand that I may call the clinic during office hours   should I have any questions or need assistance in obtaining follow up care.    __________________________________________  _____________  __________ Signature of Patient or Authorized Representative            Date                   Time    __________________________________________ Nurse's Signature

## 2012-12-24 NOTE — Progress Notes (Signed)
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 Presenting for her scheduled 3 month maintenance Rituxan infusion today.  She had initially presented early 2000 with bulky adenopathy, splenomegaly and bone marrow involvement. She has been treated to this point with chlorambucil in 2000, then with single agent Rituxan 2007 thru 09/2008. With progressive adenopathy 12/2008 in chest/ abdomen including splenomegaly/ pelvis with left ureteral obstruction requiring stent from 01/2009 thru 07/2009. She was treated with Treanda/Rituxan 02/2009 x 5 cycles thru 06/2009. She has continued maintenance Rituxan q 3 months since then. Last CT CAP was Aug.2011 and last PET Nov 2010. She has tolerated the Rituxan with no difficulty.  She reports that both she and her husband had a bad stomach virus with diarrhea and vomiting 2 weeks ago. All symptoms have resolved and she has had no other illnesses since her visit 3 months ago. She is tolerating her maintenance Rituxan without difficulty. She is however still smoking 2 cigarettes daily. She has had no other infectious symptoms, no pain, good appetite, no known peripheral adenopathy. Remainder of 10 point Review of Systems negative.  Objective:  Vital signs in last 24 hours:  BP 122/69  Pulse 66  Temp(Src) 97 F (36.1 C) (Oral)  Resp 20  Ht 5\' 4"  (1.626 m)  Wt 129 lb 8 oz (58.741 kg)  BMI 22.22 kg/m2 Weight is down 3.5 lbs. Easily ambulatory, does not appear ill  HEENT:PERRLA, sclera clear, anicteric and neck supple with midline trachea  LymphaticsCervical, supraclavicular, and axillary nodes normal. No inguinal adenopathy Resp: hyperresonant to percussion, no wheezes or rales, diminished BS symmetrically Cardio: regular rate and rhythm GI:  soft, non-tender; bowel sounds normal; no masses,  no organomegaly Extremities: extremities normal, atraumatic, no cyanosis or edema Neuro:no sensory deficits noted   Lab Results:  Results for orders placed in visit on 12/20/12  CBC WITH DIFFERENTIAL      Result Value Range   WBC 8.1  3.9 - 10.3 10e3/uL   NEUT# 5.6  1.5 - 6.5 10e3/uL   HGB 12.7  11.6 - 15.9 g/dL   HCT 16.1  09.6 - 04.5 %   Platelets 317  145 - 400 10e3/uL   MCV 94.9  79.5 - 101.0 fL   MCH 32.8  25.1 - 34.0 pg   MCHC 34.5  31.5 - 36.0 g/dL   RBC 4.09  8.11 - 9.14 10e6/uL   RDW 13.3  11.2 - 14.5 %   lymph# 1.4  0.9 - 3.3 10e3/uL   MONO# 0.8  0.1 - 0.9 10e3/uL   Eosinophils Absolute 0.2  0.0 - 0.5 10e3/uL   Basophils Absolute 0.1  0.0 - 0.1 10e3/uL   NEUT% 69.6  38.4 - 76.8 %   LYMPH% 17.8  14.0 - 49.7 %   MONO% 10.0  0.0 - 14.0 %   EOS% 1.9  0.0 - 7.0 %   BASO% 0.7  0.0 - 2.0 %  COMPREHENSIVE METABOLIC PANEL (CC13)      Result Value Range   Sodium 141  136 - 145 mEq/L   Potassium 4.1  3.5 - 5.1 mEq/L   Chloride 102  98 - 107 mEq/L   CO2 30 (*) 22 - 29 mEq/L   Glucose 83  70 - 99 mg/dl   BUN 78.2  7.0 -  26.0 mg/dL   Creatinine 0.8  0.6 - 1.1 mg/dL   Total Bilirubin 9.52  0.20 - 1.20 mg/dL   Alkaline Phosphatase 112  40 - 150 U/L   AST 15  5 - 34 U/L   ALT 10  0 - 55 U/L   Total Protein 6.6  6.4 - 8.3 g/dL   Albumin 3.9  3.5 - 5.0 g/dL   Calcium 9.7  8.4 - 84.1 mg/dL  LACTATE DEHYDROGENASE (CC13)      Result Value Range   LDH 244  125 - 245 U/L    CMET available after visit entirely normal Studies/Results:  No results found.  Medications: I have reviewed the patient's current medications. She has had flu vaccine. Patient discussed with Dr. Darrold Span  Assessment/Plan: 1.. B cell NHL with history as above: continuing maintenance Rituxan with next administration planned 03-23-13. She'll followup with Dr. Darrold Span in 3 months, prior to next rituxan, or sooner if needed.  2. recent gastrointestinal  illness-resolved. 3.long and ongoing tobacco, which she has decreased significantly but will not stop. 4.flu vaccine done  Patient and husband are comfortable with discussion and plan above.   Conni Slipper, PA-C   12/24/2012, 9:13 PM

## 2013-01-31 ENCOUNTER — Ambulatory Visit (INDEPENDENT_AMBULATORY_CARE_PROVIDER_SITE_OTHER): Payer: Medicare Other | Admitting: Family Medicine

## 2013-01-31 ENCOUNTER — Encounter: Payer: Self-pay | Admitting: Family Medicine

## 2013-01-31 VITALS — BP 124/68 | HR 131 | Temp 98.4°F | Wt 128.0 lb

## 2013-01-31 DIAGNOSIS — J209 Acute bronchitis, unspecified: Secondary | ICD-10-CM

## 2013-01-31 MED ORDER — AZITHROMYCIN 250 MG PO TABS: ORAL_TABLET | ORAL | Status: DC

## 2013-01-31 NOTE — Progress Notes (Signed)
  Subjective:    [name], female    DOB: [birth year], 72 y.o.   MRN: [id number]  HPI Here for 4 days of PND, ST, chest congestion and a dry cough. Low grade fever. Taking Claritin. Her husband has a similar illness.    Review of Systems  Constitutional: Positive for fever.  HENT: Positive for congestion and postnasal drip.   Eyes: Negative.   Respiratory: Positive for cough and chest tightness. Negative for shortness of breath and wheezing.        Objective:   Physical Exam  Constitutional: She appears well-developed and well-nourished.  HENT:  Right Ear: External ear normal.  Left Ear: External ear normal.  Nose: Nose normal.  Mouth/Throat: Oropharynx is clear and moist.  Eyes: Conjunctivae are normal.  Pulmonary/Chest: Effort normal. No respiratory distress. She has no wheezes. She has no rales.  Scattered rhonchi           Assessment & Plan:  Add Robitussin DM

## 2013-02-07 ENCOUNTER — Encounter: Payer: Self-pay | Admitting: Family Medicine

## 2013-02-07 ENCOUNTER — Ambulatory Visit (INDEPENDENT_AMBULATORY_CARE_PROVIDER_SITE_OTHER): Payer: Medicare Other | Admitting: Family Medicine

## 2013-02-07 VITALS — BP 114/72 | HR 88 | Temp 97.6°F | Wt 126.0 lb

## 2013-02-07 DIAGNOSIS — J209 Acute bronchitis, unspecified: Secondary | ICD-10-CM

## 2013-02-07 MED ORDER — CEFUROXIME AXETIL 500 MG PO TABS: 500.0000 mg | ORAL_TABLET | Freq: Two times a day (BID) | ORAL | Status: DC

## 2013-02-07 MED ORDER — ALBUTEROL SULFATE HFA 108 (90 BASE) MCG/ACT IN AERS: 2.0000 | INHALATION_SPRAY | RESPIRATORY_TRACT | Status: DC | PRN

## 2013-02-07 MED ORDER — METHYLPREDNISOLONE ACETATE 80 MG/ML IJ SUSP
80.0000 mg | Freq: Once | INTRAMUSCULAR | Status: AC
  Administered 2013-02-07: 80 mg via INTRAMUSCULAR

## 2013-02-07 NOTE — Addendum Note (Signed)
Addended by: Alfred Levins D on: 02/07/2013 12:18 PM   Modules accepted: Orders

## 2013-02-07 NOTE — Progress Notes (Signed)
  Subjective:    Patient ID: [name], female    DOB: [birth year], 72 y.o.   MRN: [id number]  HPI Here for continued URI symptoms. She was here one week ago for chest tightness and cough, and she was given a Zpack. This did not help her much, and the symptoms still remain. The cough is non-productive. No chest pain or fever.    Review of Systems  Constitutional: Negative.   HENT: Negative.   Eyes: Negative.   Respiratory: Positive for cough, chest tightness and wheezing.   Cardiovascular: Negative.        Objective:   Physical Exam  Constitutional: She appears well-developed and well-nourished. No distress.  HENT:  Right Ear: External ear normal.  Left Ear: External ear normal.  Nose: Nose normal.  Mouth/Throat: Oropharynx is clear and moist.  Eyes: Conjunctivae are normal.  Pulmonary/Chest: Effort normal. No respiratory distress. She has no rales.  Soft wheezes   Lymphadenopathy:    She has no cervical adenopathy.          Assessment & Plan:  Given a steroid shot and a course of Ceftin. Use Ventolin prn

## 2013-02-20 ENCOUNTER — Other Ambulatory Visit: Payer: Self-pay | Admitting: Internal Medicine

## 2013-02-20 MED ORDER — CITALOPRAM HYDROBROMIDE 40 MG PO TABS: 40.0000 mg | ORAL_TABLET | Freq: Every day | ORAL | Status: DC

## 2013-02-20 NOTE — Addendum Note (Signed)
Addended by: Alfred Levins D on: 02/20/2013 11:18 AM   Modules accepted: Orders

## 2013-02-20 NOTE — Telephone Encounter (Signed)
PT has fu appt on 7/1 but would like enough of her RX to last her until then. Please assist.

## 2013-02-20 NOTE — Telephone Encounter (Signed)
rx sent in electronically 

## 2013-03-19 ENCOUNTER — Other Ambulatory Visit: Payer: Self-pay | Admitting: Oncology

## 2013-03-21 ENCOUNTER — Telehealth: Payer: Self-pay | Admitting: *Deleted

## 2013-03-21 ENCOUNTER — Telehealth: Payer: Self-pay | Admitting: Oncology

## 2013-03-21 ENCOUNTER — Encounter: Payer: Self-pay | Admitting: Oncology

## 2013-03-21 ENCOUNTER — Other Ambulatory Visit (HOSPITAL_BASED_OUTPATIENT_CLINIC_OR_DEPARTMENT_OTHER): Payer: Medicare Other | Admitting: Lab

## 2013-03-21 ENCOUNTER — Ambulatory Visit (HOSPITAL_BASED_OUTPATIENT_CLINIC_OR_DEPARTMENT_OTHER): Payer: Medicare Other | Admitting: Oncology

## 2013-03-21 VITALS — BP 125/63 | HR 58 | Temp 97.7°F | Resp 18 | Ht 64.0 in | Wt 125.5 lb

## 2013-03-21 DIAGNOSIS — C8589 Other specified types of non-Hodgkin lymphoma, extranodal and solid organ sites: Secondary | ICD-10-CM

## 2013-03-21 DIAGNOSIS — R21 Rash and other nonspecific skin eruption: Secondary | ICD-10-CM

## 2013-03-21 DIAGNOSIS — F17200 Nicotine dependence, unspecified, uncomplicated: Secondary | ICD-10-CM

## 2013-03-21 LAB — LACTATE DEHYDROGENASE (CC13): LDH: 231 U/L (ref 125–245)

## 2013-03-21 LAB — COMPREHENSIVE METABOLIC PANEL (CC13)
ALT: 7 U/L (ref 0–55)
AST: 14 U/L (ref 5–34)
Albumin: 4 g/dL (ref 3.5–5.0)
Alkaline Phosphatase: 135 U/L (ref 40–150)
Potassium: 4 mEq/L (ref 3.5–5.1)
Sodium: 141 mEq/L (ref 136–145)
Total Bilirubin: 0.41 mg/dL (ref 0.20–1.20)
Total Protein: 6.8 g/dL (ref 6.4–8.3)

## 2013-03-21 LAB — CBC WITH DIFFERENTIAL/PLATELET
BASO%: 1.1 % (ref 0.0–2.0)
EOS%: 2.3 % (ref 0.0–7.0)
HGB: 13.4 g/dL (ref 11.6–15.9)
MCH: 33.5 pg (ref 25.1–34.0)
MCV: 95 fL (ref 79.5–101.0)
MONO%: 11 % (ref 0.0–14.0)
RBC: 4 10*6/uL (ref 3.70–5.45)
RDW: 14 % (ref 11.2–14.5)
lymph#: 0.8 10*3/uL — ABNORMAL LOW (ref 0.9–3.3)

## 2013-03-21 NOTE — Telephone Encounter (Signed)
Apapt for 9/2 to see MD moved earlier and I have scheduled appt. Edmonia Caprio Lacretia Nicks

## 2013-03-21 NOTE — Telephone Encounter (Signed)
Per staff message and POF I have scheduled appts. No available for appt to move from 6/13 to 6/17, I moved appt to 6/18. Plus MD visit on 9/2 is to late for 7 treatment. Scheduler notified. JMW

## 2013-03-21 NOTE — Patient Instructions (Signed)
Please call Dr Precious Reel nurse for report of CT if you don't hear from this office within 1-2 days of the scan

## 2013-03-21 NOTE — Progress Notes (Signed)
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 She will see PCP Dr Cato Mulligan in early July.  Oncologic History Patient presented early 2000 with bulky adenopathy, splenomegaly and bone marrow involvement. She was treated with chlorambucil in 2000, then with single agent Rituxan 2007 thru 09/2008. 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 She required left ureteral stent from 01-2009 thru 07-2009. She was treated with Treanda/Rituxan x 5 cycles from 02-2009 thru 06-2009 and has continued maintenance Rituxan every 3 months since then. She has tolerated Rituxan with no difficulty. Last imaging was CT CAP 05-2010.  Review of Systems Patient and family had GI illness in Dec 2013 and respiratory illness in April 2014 which both resolved; she also had puritic rash on lower legs which was treated by Dr Jorja Loa recently, improving initially but more bothersome again now. She denies adenopathy, new or different pain, increased SOB or cough (still smoking 2-3 cigarettes daily, which she has not been interested in stopping), bowel or bladder symptoms now, any bleeding, any fever or other symptoms of infection Appetite and energy have been generally good.  Remainder of 10 point Review of Systems negative.  Patient and husband will have their 50th wedding anniversary on 03-23-13.  Objective:  Vital signs in last 24 hours:  BP 125/63  Pulse 58  Temp(Src) 97.7 F (36.5 C) (Oral)  Resp 18  Ht 5\' 4"  (1.626 m)  Wt 125 lb 8 oz (56.926 kg)  BMI 21.53 kg/m2  Easily ambulatory, respirations not labored RA, looks comfortable.  HEENT:PERRLA, sclera clear, anicteric, oropharynx clear, no lesions and neck  supple with midline trachea Normal hair pattern LymphaticsCervical, supraclavicular, and axillary nodes normal. No inguinal adenopathy Resp: clear to auscultation bilaterally and normal percussion bilaterally Cardio:RRR no gallop, clear heart sounds GI: soft, non-tender; bowel sounds normal; no masses,  no organomegaly Extremities: no edema, cords, tenderness Neuro:nonfocal Skin: excoriated areas with erythema lower legs R>L and 2 similar areas left upper scapula. No drainage and not clearly infected.   Lab Results:  Results for orders placed in visit on 03/21/13  CBC WITH DIFFERENTIAL      Result Value Range   WBC 5.8  3.9 - 10.3 10e3/uL   NEUT# 4.1  1.5 - 6.5 10e3/uL   HGB 13.4  11.6 - 15.9 g/dL   HCT 13.2  44.0 - 10.2 %   Platelets 251  145 - 400 10e3/uL   MCV 95.0  79.5 - 101.0 fL   MCH 33.5  25.1 - 34.0 pg   MCHC 35.3  31.5 - 36.0 g/dL   RBC 7.25  3.66 - 4.40 10e6/uL   RDW 14.0  11.2 - 14.5 %   lymph# 0.8 (*) 0.9 - 3.3 10e3/uL   MONO# 0.6  0.1 - 0.9 10e3/uL   Eosinophils Absolute 0.1  0.0 - 0.5 10e3/uL   Basophils Absolute 0.1  0.0 - 0.1 10e3/uL   NEUT% 71.9  38.4 - 76.8 %   LYMPH% 13.7 (*) 14.0 - 49.7 %   MONO% 11.0  0.0 - 14.0 %   EOS% 2.3  0.0 - 7.0 %   BASO% 1.1  0.0 - 2.0 %  LACTATE DEHYDROGENASE (CC13)      Result Value Range  LDH 231  125 - 245 U/L  COMPREHENSIVE METABOLIC PANEL (CC13)      Result Value Range   Sodium 141  136 - 145 mEq/L   Potassium 4.0  3.5 - 5.1 mEq/L   Chloride 104  98 - 107 mEq/L   CO2 29  22 - 29 mEq/L   Glucose 69 (*) 70 - 99 mg/dl   BUN 91.4  7.0 - 78.2 mg/dL   Creatinine 0.8  0.6 - 1.1 mg/dL   Total Bilirubin 9.56  0.20 - 1.20 mg/dL   Alkaline Phosphatase 135  40 - 150 U/L   AST 14  5 - 34 U/L   ALT 7  0 - 55 U/L   Total Protein 6.8  6.4 - 8.3 g/dL   Albumin 4.0  3.5 - 5.0 g/dL   Calcium 9.7  8.4 - 21.3 mg/dL    Quantitative immunoglobulins ordered with several infectious problems in last 6 months.  Note rituxan was begun  prior to recommendation for hepatits studies, but she has had no problems from that standpoint.  Studies/Results:  We have discussed repeat CTs, as it has been almost 3 years since imaging, with ongoing Rituxan. She and husband are in agreement with this, which we will try to have done in next few weeks.     Medications: I have reviewed the patient's current medications. She will have Rituxan on 03-28-13  Assessment/Plan:  1.B cell NHL: history as above. She will have maintenance Rituxan next week on same q 3 month schedule. Will repeat CT CAP upcoming (no IV contrast due to allergy, so this will be somewhat limited information, but still should be useful; if scan looks very good, would consider lengthening interval of the Rituxan (tho patient today is very reluctant to consider that). We will be in touch with her by phone with results of CT. She will be seen back by this office in 3 months, with CBC CMET LDH. 2. Long and ongoing tobacco, which she has not been able to DC entirely in all the time that I have known her. 3.puritic rash: she had cancelled follow up visit with Dr Jorja Loa when rash was much improved, but agrees to rescheduling with him now.  Patient and husband have had questions answered to their satisfaction and are in agreement with plan above.  Latayvia Mandujano P, MD   03/21/2013, 2:45 PM

## 2013-03-21 NOTE — Telephone Encounter (Signed)
gv and printeda ppt sched and avs for pt...emailed MW to add tx....will call pt....pt wanted to sched with Dr. Hortense Ramal herself

## 2013-03-21 NOTE — Telephone Encounter (Signed)
s.w. pt and advised on all appt...pt ok and aware °

## 2013-03-22 ENCOUNTER — Telehealth: Payer: Self-pay | Admitting: *Deleted

## 2013-03-22 ENCOUNTER — Other Ambulatory Visit: Payer: Self-pay | Admitting: Oncology

## 2013-03-22 DIAGNOSIS — C8589 Other specified types of non-Hodgkin lymphoma, extranodal and solid organ sites: Secondary | ICD-10-CM

## 2013-03-22 NOTE — Telephone Encounter (Signed)
Radiology called to say CT is ordered with contrast and pt is allergic to contrast, always does CT without. CT is scheduled for 6/23. Needs order changed in EPIC

## 2013-03-23 ENCOUNTER — Ambulatory Visit: Payer: Medicare Other

## 2013-03-27 ENCOUNTER — Other Ambulatory Visit: Payer: Medicare Other | Admitting: Lab

## 2013-03-28 ENCOUNTER — Ambulatory Visit (HOSPITAL_BASED_OUTPATIENT_CLINIC_OR_DEPARTMENT_OTHER): Payer: Medicare Other

## 2013-03-28 ENCOUNTER — Other Ambulatory Visit (HOSPITAL_BASED_OUTPATIENT_CLINIC_OR_DEPARTMENT_OTHER): Payer: Medicare Other

## 2013-03-28 VITALS — BP 97/52 | HR 66 | Temp 97.9°F | Resp 18

## 2013-03-28 DIAGNOSIS — C8589 Other specified types of non-Hodgkin lymphoma, extranodal and solid organ sites: Secondary | ICD-10-CM

## 2013-03-28 DIAGNOSIS — Z5112 Encounter for antineoplastic immunotherapy: Secondary | ICD-10-CM

## 2013-03-28 LAB — COMPREHENSIVE METABOLIC PANEL (CC13)
ALT: 10 U/L (ref 0–55)
Albumin: 4.3 g/dL (ref 3.5–5.0)
CO2: 27 mEq/L (ref 22–29)
Calcium: 10.1 mg/dL (ref 8.4–10.4)
Chloride: 101 mEq/L (ref 98–107)
Creatinine: 0.8 mg/dL (ref 0.6–1.1)

## 2013-03-28 LAB — CBC WITH DIFFERENTIAL/PLATELET
Basophils Absolute: 0.1 10*3/uL (ref 0.0–0.1)
Eosinophils Absolute: 0.2 10*3/uL (ref 0.0–0.5)
HGB: 13.2 g/dL (ref 11.6–15.9)
MCV: 97 fL (ref 79.5–101.0)
MONO#: 0.6 10*3/uL (ref 0.1–0.9)
NEUT#: 3.7 10*3/uL (ref 1.5–6.5)
RDW: 13.6 % (ref 11.2–14.5)
WBC: 5.8 10*3/uL (ref 3.9–10.3)
lymph#: 1.1 10*3/uL (ref 0.9–3.3)

## 2013-03-28 LAB — LACTATE DEHYDROGENASE (CC13): LDH: 266 U/L — ABNORMAL HIGH (ref 125–245)

## 2013-03-28 MED ORDER — SODIUM CHLORIDE 0.9 % IV SOLN
Freq: Once | INTRAVENOUS | Status: AC
  Administered 2013-03-28: 09:00:00 via INTRAVENOUS

## 2013-03-28 MED ORDER — SODIUM CHLORIDE 0.9 % IV SOLN
375.0000 mg/m2 | Freq: Once | INTRAVENOUS | Status: AC
  Administered 2013-03-28: 600 mg via INTRAVENOUS
  Filled 2013-03-28: qty 60

## 2013-03-28 MED ORDER — DIPHENHYDRAMINE HCL 25 MG PO CAPS
25.0000 mg | ORAL_CAPSULE | Freq: Once | ORAL | Status: AC
  Administered 2013-03-28: 25 mg via ORAL

## 2013-03-28 MED ORDER — ACETAMINOPHEN 325 MG PO TABS
650.0000 mg | ORAL_TABLET | Freq: Once | ORAL | Status: AC
  Administered 2013-03-28: 650 mg via ORAL

## 2013-03-28 NOTE — Progress Notes (Signed)
Rituxan rate remains at 211 mls/hr for remainder of medication.  Pt stable

## 2013-03-28 NOTE — Progress Notes (Signed)
rituxan increased to 150mls/hr for 53 mls. Pt stable

## 2013-03-28 NOTE — Progress Notes (Signed)
rituxan increased to 158 mls/hr for 79 mls. Pt stable.

## 2013-03-28 NOTE — Patient Instructions (Addendum)
[facility] Discharge Instructions for Patients Receiving Chemotherapy  Today you received the following chemotherapy agents Rituxan.  To help prevent nausea and vomiting after your treatment, we encourage you to take your nausea medication as prescribed.   If you develop nausea and vomiting that is not controlled by your nausea medication, call the clinic.   BELOW ARE SYMPTOMS THAT SHOULD BE REPORTED IMMEDIATELY:  *FEVER GREATER THAN 100.5 F  *CHILLS WITH OR WITHOUT FEVER  NAUSEA AND VOMITING THAT IS NOT CONTROLLED WITH YOUR NAUSEA MEDICATION  *UNUSUAL SHORTNESS OF BREATH  *UNUSUAL BRUISING OR BLEEDING  TENDERNESS IN MOUTH AND THROAT WITH OR WITHOUT PRESENCE OF ULCERS  *URINARY PROBLEMS  *BOWEL PROBLEMS  UNUSUAL RASH Items with * indicate a potential emergency and should be followed up as soon as possible.  Feel free to call the clinic you have any questions or concerns. The clinic phone number is [phone].    

## 2013-03-28 NOTE — Progress Notes (Signed)
Pre rituxan vitals.  Rate started at 53 cc's/hr for 26ccs = 100 mg/hr

## 2013-03-28 NOTE — Progress Notes (Signed)
Rituxan rate increased to 211 mls/hr for 105 mls by Raliegh Ip RN.

## 2013-03-29 ENCOUNTER — Other Ambulatory Visit: Payer: Self-pay | Admitting: Certified Registered Nurse Anesthetist

## 2013-03-29 LAB — IGG, IGA, IGM
IgA: 70 mg/dL (ref 69–380)
IgM, Serum: 62 mg/dL (ref 52–322)

## 2013-04-02 ENCOUNTER — Encounter (HOSPITAL_COMMUNITY): Payer: Self-pay

## 2013-04-02 ENCOUNTER — Ambulatory Visit (HOSPITAL_COMMUNITY)
Admission: RE | Admit: 2013-04-02 | Discharge: 2013-04-02 | Disposition: A | Discharge status: Home / Self Care | Payer: Medicare Other | Source: Ambulatory Visit | Attending: Oncology | Admitting: Oncology

## 2013-04-02 DIAGNOSIS — C8589 Other specified types of non-Hodgkin lymphoma, extranodal and solid organ sites: Secondary | ICD-10-CM | POA: Insufficient documentation

## 2013-04-02 DIAGNOSIS — K449 Diaphragmatic hernia without obstruction or gangrene: Secondary | ICD-10-CM | POA: Insufficient documentation

## 2013-04-02 DIAGNOSIS — R599 Enlarged lymph nodes, unspecified: Secondary | ICD-10-CM | POA: Insufficient documentation

## 2013-04-06 ENCOUNTER — Telehealth: Payer: Self-pay

## 2013-04-06 NOTE — Telephone Encounter (Signed)
Message copied by Lorine Bears on Fri Apr 06, 2013  4:13 PM ------      Message from: Reece Packer      Created: Tue Apr 03, 2013  7:19 PM       Labs seen and need follow up; please let her know that the CT scans look very good. The only slightly enlarged lymph nodes are in right axilla, otherwise good in chest, abdomen, pelvis. Spleen size is normal. She does need to have mammograms on schedule in July, and I would recommend 3D or tomo mammograms then (which she just needs to request when she goes to the mammogram appointment).       Cc LA, TH ------

## 2013-04-06 NOTE — Telephone Encounter (Signed)
Spoke with Brooke Washington and told her the information noted below by Dr. Darrold Span  for CT results and scheduling mammograms.  Pt. verbalized understanding.

## 2013-04-10 ENCOUNTER — Encounter: Payer: Self-pay | Admitting: Internal Medicine

## 2013-04-10 ENCOUNTER — Ambulatory Visit (INDEPENDENT_AMBULATORY_CARE_PROVIDER_SITE_OTHER): Payer: Medicare Other | Admitting: Internal Medicine

## 2013-04-10 VITALS — BP 118/72 | HR 64 | Temp 98.0°F | Ht 64.0 in | Wt 125.0 lb

## 2013-04-10 DIAGNOSIS — F39 Unspecified mood [affective] disorder: Secondary | ICD-10-CM

## 2013-04-10 DIAGNOSIS — F329 Major depressive disorder, single episode, unspecified: Secondary | ICD-10-CM

## 2013-04-10 DIAGNOSIS — F3289 Other specified depressive episodes: Secondary | ICD-10-CM

## 2013-04-10 MED ORDER — CITALOPRAM HYDROBROMIDE 40 MG PO TABS: 40.0000 mg | ORAL_TABLET | Freq: Every day | ORAL | Status: DC

## 2013-04-10 NOTE — Progress Notes (Signed)
[name], female   DOB: 06-26-1941, 72 y.o.   MRN: [id number]  Mood - doing well on celexa  NHL- followed by oncology-- maintenance rituxan.  Reviewed pmh, psh, sochx Reviewed meds   patient denies chest pain, shortness of breath, orthopnea. Denies lower extremity edema, abdominal pain, change in appetite, change in bowel movements. Patient denies rashes, musculoskeletal complaints. No other specific complaints in a complete review of systems.    Well-developed well-nourished female in no acute distress. HEENT exam atraumatic, normocephalic, extraocular muscles are intact. Neck is supple. No jugular venous distention no thyromegaly. Chest clear to auscultation without increased work of breathing. Cardiac exam S1 and S2 are regular. Abdominal exam active bowel sounds, soft, nontender. Extremities no edema. Neurologic exam she is alert without any motor sensory deficits. Gait is normal.

## 2013-04-10 NOTE — Assessment & Plan Note (Signed)
Doing well on meds Continue same

## 2013-06-08 ENCOUNTER — Other Ambulatory Visit: Payer: Self-pay

## 2013-06-08 DIAGNOSIS — C8589 Other specified types of non-Hodgkin lymphoma, extranodal and solid organ sites: Secondary | ICD-10-CM

## 2013-06-12 ENCOUNTER — Other Ambulatory Visit (HOSPITAL_BASED_OUTPATIENT_CLINIC_OR_DEPARTMENT_OTHER): Payer: Medicare Other | Admitting: Lab

## 2013-06-12 ENCOUNTER — Ambulatory Visit (HOSPITAL_BASED_OUTPATIENT_CLINIC_OR_DEPARTMENT_OTHER): Payer: Medicare Other

## 2013-06-12 ENCOUNTER — Ambulatory Visit: Payer: Medicare Other | Admitting: Oncology

## 2013-06-12 ENCOUNTER — Other Ambulatory Visit: Payer: Self-pay | Admitting: Internal Medicine

## 2013-06-12 ENCOUNTER — Telehealth: Payer: Self-pay | Admitting: Internal Medicine

## 2013-06-12 ENCOUNTER — Other Ambulatory Visit: Payer: Medicare Other | Admitting: Lab

## 2013-06-12 ENCOUNTER — Ambulatory Visit (HOSPITAL_BASED_OUTPATIENT_CLINIC_OR_DEPARTMENT_OTHER): Payer: Medicare Other | Admitting: Internal Medicine

## 2013-06-12 VITALS — BP 108/50 | HR 70 | Temp 97.7°F | Resp 18

## 2013-06-12 VITALS — BP 129/62 | HR 74 | Temp 98.0°F | Resp 18 | Ht 64.0 in | Wt 129.8 lb

## 2013-06-12 DIAGNOSIS — C8589 Other specified types of non-Hodgkin lymphoma, extranodal and solid organ sites: Secondary | ICD-10-CM

## 2013-06-12 DIAGNOSIS — R21 Rash and other nonspecific skin eruption: Secondary | ICD-10-CM | POA: Insufficient documentation

## 2013-06-12 DIAGNOSIS — Z5112 Encounter for antineoplastic immunotherapy: Secondary | ICD-10-CM

## 2013-06-12 DIAGNOSIS — C8298 Follicular lymphoma, unspecified, lymph nodes of multiple sites: Secondary | ICD-10-CM

## 2013-06-12 LAB — CBC WITH DIFFERENTIAL/PLATELET
BASO%: 0.7 % (ref 0.0–2.0)
Basophils Absolute: 0 10*3/uL (ref 0.0–0.1)
EOS%: 4.5 % (ref 0.0–7.0)
HCT: 37.8 % (ref 34.8–46.6)
HGB: 12.9 g/dL (ref 11.6–15.9)
LYMPH%: 16.8 % (ref 14.0–49.7)
MCH: 32.9 pg (ref 25.1–34.0)
MCHC: 34.1 g/dL (ref 31.5–36.0)
MCV: 96.4 fL (ref 79.5–101.0)
NEUT%: 67 % (ref 38.4–76.8)
Platelets: 255 10*3/uL (ref 145–400)
lymph#: 1 10*3/uL (ref 0.9–3.3)

## 2013-06-12 LAB — COMPREHENSIVE METABOLIC PANEL (CC13)
Albumin: 3.8 g/dL (ref 3.5–5.0)
Alkaline Phosphatase: 113 U/L (ref 40–150)
BUN: 12.4 mg/dL (ref 7.0–26.0)
Calcium: 9.3 mg/dL (ref 8.4–10.4)
Chloride: 109 mEq/L (ref 98–109)
Creatinine: 0.7 mg/dL (ref 0.6–1.1)
Glucose: 88 mg/dl (ref 70–140)
Potassium: 4 mEq/L (ref 3.5–5.1)

## 2013-06-12 MED ORDER — SODIUM CHLORIDE 0.9 % IV SOLN
Freq: Once | INTRAVENOUS | Status: AC
  Administered 2013-06-12: 11:00:00 via INTRAVENOUS

## 2013-06-12 MED ORDER — DIPHENHYDRAMINE HCL 25 MG PO CAPS
25.0000 mg | ORAL_CAPSULE | Freq: Once | ORAL | Status: AC
Start: 2013-06-12 — End: 2013-06-12
  Administered 2013-06-12: 25 mg via ORAL

## 2013-06-12 MED ORDER — SODIUM CHLORIDE 0.9 % IV SOLN
375.0000 mg/m2 | Freq: Once | INTRAVENOUS | Status: AC
  Administered 2013-06-12: 600 mg via INTRAVENOUS
  Filled 2013-06-12: qty 60

## 2013-06-12 MED ORDER — ACETAMINOPHEN 325 MG PO TABS
650.0000 mg | ORAL_TABLET | Freq: Once | ORAL | Status: AC
  Administered 2013-06-12: 650 mg via ORAL

## 2013-06-12 NOTE — Progress Notes (Signed)
OFFICE PROGRESS NOTE   06/12/2013   Physicians:B.Swords,L.Borden, D.Juanda Chance, D.Gottsegen, S.Tafeen   INTERVAL HISTORY:   Patient is seen, together with husband, in scheduled follow up of B cell nonHodgkins lymphoma, on maintenance Rituxan since last recurrence in 2010. She was last seen by Dr. Darrold Span in 03/21/2013.  She had an CT of C/A/P on 04/02/2013 as noted below.    Oncology History   --Early 2000.  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 She was treated with chlorambucil in 2000.   --2007 - 09/2008.  Treated with single agent Rituxan.   --12/2008.  She had progressive adenopathy in chest and abdomen and repeat biopsy showed low grade follicular and diffuse B cell NHL.   --01/2009 - 07/2009. She required left ureteral stent and was treated with Treanda/Rituxan x 5 cycles from 02-2009 thru 06-2009.   --06/2009.  On maintenance Rituxan every 3 months without difficulty.  --04/02/2013.    CT CAP demonstrated  stable disease No evidence of lymphoma recurrence within the abdomen or pelvis but new mild right axillary lymphadenopathy without mediastinal adenopathy    Current treatment: Rituxan 375 mg/m^2 q 3 months  Review of Systems She continues to have a puritic rash on lower legs which was treated by Dr Jorja Loa recently and continues to improve mildly.  She denies adenopathy, new or different pain, increased SOB or cough.  She smokes 2-3 cigarettes daily and has not been interested in stopping (see prior note regarding smoking cessation). She denies bowel (constipation, diarrhea) or bladder symptoms (dysuria, hematuria).  She denies bleeding or fever of recent hospital admission.  She endorses compliance to her medical regiment. She remains independent for basic and intermediate ADLs.  Her appetite and energy is good.  The remaining of her 12 point ROS is negative.   Objective:  Vital  signs in last 24 hours:  BP 129/62  Pulse 74  Temp(Src) 98 F (36.7 C) (Oral)  Resp 18  Ht 5\' 4"  (1.626 m)  Wt 129 lb 12.8 oz (58.877 kg)  BMI 22.27 kg/m2  SpO2 98%  General: Appears her stated age in no acute distress. Easily ambulatory HEENT:PERRLA, sclera clear, anicteric, oropharynx clear, no lesions and neck supple with midline trachea Normal hair pattern LymphaticsCervical, supraclavicular, and axillary nodes normal. No inguinal adenopathy Resp: clear to auscultation bilaterally and normal percussion bilaterally Cardio:RRR no gallop, clear heart sounds GI: soft, non-tender; bowel sounds normal; no masses,  no organomegaly Extremities: no edema, cords, tenderness Neuro:nonfocal with normal gait Skin: excoriated areas with erythema lower legs R>L.  No drainage without signs of infections or purulence.   Lab Results:  Results for orders placed in visit on 06/12/13  CBC WITH DIFFERENTIAL      Result Value Range   WBC 5.8  3.9 - 10.3 10e3/uL   NEUT# 3.9  1.5 - 6.5 10e3/uL   HGB 12.9  11.6 - 15.9 g/dL   HCT 16.1  09.6 - 04.5 %   Platelets 255  145 - 400 10e3/uL   MCV 96.4  79.5 - 101.0 fL   MCH 32.9  25.1 - 34.0 pg   MCHC 34.1  31.5 - 36.0 g/dL   RBC 4.09  8.11 - 9.14 10e6/uL   RDW 13.0  11.2 - 14.5 %   lymph# 1.0  0.9 - 3.3 10e3/uL   MONO# 0.6  0.1 - 0.9 10e3/uL   Eosinophils Absolute 0.3  0.0 - 0.5 10e3/uL  Basophils Absolute 0.0  0.0 - 0.1 10e3/uL   NEUT% 67.0  38.4 - 76.8 %   LYMPH% 16.8  14.0 - 49.7 %   MONO% 11.0  0.0 - 14.0 %   EOS% 4.5  0.0 - 7.0 %   BASO% 0.7  0.0 - 2.0 %   nRBC 0  0 - 0 %    Of note, her quantitative immunoglobulins ordered with several infectious problems in last 6 months.  Of note rituxan was begun prior to recommendation for hepatits studies, but she has had no problems from that standpoint.  Studies/Results:  CT-scan of abdomen and pelvis (04/02/2013) Comparison: CT 06/02/2010  CT CHEST  IMPRESSION:  1. New mild right axillary  lymphadenopathy is concerning for lymphoma recurrence.  2. No mediastinal adenopathy.   CT ABDOMEN AND PELVIS  IMPRESSION:  1. No evidence of lymphoma recurrence within the abdomen or pelvis.  2. Please see chest section for new mild right axillary adenopathy  Medications: I have reviewed the patient's current medications. She will have Rituxan on 06/12/13  Assessment/Plan:  1.B cell NHL, stable.  --Last scans as noted above c/w stable disease with the exception of new right axillary nodule.   --We plan to continue  Rituxan q 3 months with consideration to increase interval to q 4 months on next visit. .   --Obtain CBC, CMP and LDH prior to next visit.  2. Tobacco abuse, she continues to have tobacco use and has been counseled on prior visits regarding assistance in quitting. She has been offered assistance in cessation and will requests as needed.  3. Puritic rash, improving. She will follow-up with Dr. Jorja Loa as needed.  She has been counseled to make an appointment sooner if she has symptoms of infections such as fever or worsening rash.  Patient and husband have had questions answered to their satisfaction and are in agreement with plan above.  Navjot Loera, MD   06/12/2013, 10:11 AM

## 2013-06-12 NOTE — Patient Instructions (Signed)
1. RTC in 3 months for rituxan and labs including CMP, CBC and LDH.

## 2013-06-12 NOTE — Patient Instructions (Addendum)
[facility] Discharge Instructions for Patients Receiving Chemotherapy  Today you received the following chemotherapy agents Rituxan.  To help prevent nausea and vomiting after your treatment, we encourage you to take your nausea medication as prescribed.   If you develop nausea and vomiting that is not controlled by your nausea medication, call the clinic.   BELOW ARE SYMPTOMS THAT SHOULD BE REPORTED IMMEDIATELY:  *FEVER GREATER THAN 100.5 F  *CHILLS WITH OR WITHOUT FEVER  NAUSEA AND VOMITING THAT IS NOT CONTROLLED WITH YOUR NAUSEA MEDICATION  *UNUSUAL SHORTNESS OF BREATH  *UNUSUAL BRUISING OR BLEEDING  TENDERNESS IN MOUTH AND THROAT WITH OR WITHOUT PRESENCE OF ULCERS  *URINARY PROBLEMS  *BOWEL PROBLEMS  UNUSUAL RASH Items with * indicate a potential emergency and should be followed up as soon as possible.  Feel free to call the clinic you have any questions or concerns. The clinic phone number is [phone].    

## 2013-06-12 NOTE — Telephone Encounter (Signed)
gv pt appt schedule for December. °

## 2013-07-19 ENCOUNTER — Ambulatory Visit (INDEPENDENT_AMBULATORY_CARE_PROVIDER_SITE_OTHER): Payer: Medicare Other | Admitting: Internal Medicine

## 2013-07-19 DIAGNOSIS — Z23 Encounter for immunization: Secondary | ICD-10-CM

## 2013-07-26 ENCOUNTER — Other Ambulatory Visit: Payer: Self-pay | Admitting: Oncology

## 2013-09-11 ENCOUNTER — Telehealth: Payer: Self-pay | Admitting: Internal Medicine

## 2013-09-11 ENCOUNTER — Ambulatory Visit (HOSPITAL_BASED_OUTPATIENT_CLINIC_OR_DEPARTMENT_OTHER): Payer: Medicare Other | Admitting: Internal Medicine

## 2013-09-11 ENCOUNTER — Other Ambulatory Visit (HOSPITAL_BASED_OUTPATIENT_CLINIC_OR_DEPARTMENT_OTHER): Payer: Medicare Other | Admitting: Lab

## 2013-09-11 ENCOUNTER — Ambulatory Visit (HOSPITAL_BASED_OUTPATIENT_CLINIC_OR_DEPARTMENT_OTHER): Payer: Medicare Other

## 2013-09-11 ENCOUNTER — Other Ambulatory Visit: Payer: Self-pay | Admitting: Internal Medicine

## 2013-09-11 VITALS — BP 129/63 | HR 66 | Temp 97.6°F | Resp 18 | Ht 64.0 in | Wt 132.0 lb

## 2013-09-11 VITALS — BP 110/66 | HR 65 | Temp 98.2°F | Resp 18

## 2013-09-11 DIAGNOSIS — Z720 Tobacco use: Secondary | ICD-10-CM

## 2013-09-11 DIAGNOSIS — R21 Rash and other nonspecific skin eruption: Secondary | ICD-10-CM

## 2013-09-11 DIAGNOSIS — C8298 Follicular lymphoma, unspecified, lymph nodes of multiple sites: Secondary | ICD-10-CM

## 2013-09-11 DIAGNOSIS — F17200 Nicotine dependence, unspecified, uncomplicated: Secondary | ICD-10-CM

## 2013-09-11 DIAGNOSIS — C8589 Other specified types of non-Hodgkin lymphoma, extranodal and solid organ sites: Secondary | ICD-10-CM

## 2013-09-11 DIAGNOSIS — Z5112 Encounter for antineoplastic immunotherapy: Secondary | ICD-10-CM

## 2013-09-11 LAB — COMPREHENSIVE METABOLIC PANEL (CC13)
ALT: 10 U/L (ref 0–55)
Albumin: 3.9 g/dL (ref 3.5–5.0)
Anion Gap: 12 mEq/L — ABNORMAL HIGH (ref 3–11)
CO2: 27 mEq/L (ref 22–29)
Calcium: 9.7 mg/dL (ref 8.4–10.4)
Chloride: 102 mEq/L (ref 98–109)
Potassium: 4.1 mEq/L (ref 3.5–5.1)
Sodium: 141 mEq/L (ref 136–145)
Total Protein: 6.6 g/dL (ref 6.4–8.3)

## 2013-09-11 LAB — CBC WITH DIFFERENTIAL/PLATELET
Basophils Absolute: 0.1 10*3/uL (ref 0.0–0.1)
Eosinophils Absolute: 0.4 10*3/uL (ref 0.0–0.5)
HGB: 13.2 g/dL (ref 11.6–15.9)
MONO#: 0.8 10*3/uL (ref 0.1–0.9)
NEUT#: 3.6 10*3/uL (ref 1.5–6.5)
RBC: 4.06 10*6/uL (ref 3.70–5.45)
RDW: 13 % (ref 11.2–14.5)
WBC: 6.1 10*3/uL (ref 3.9–10.3)
nRBC: 0 % (ref 0–0)

## 2013-09-11 MED ORDER — ACETAMINOPHEN 325 MG PO TABS
650.0000 mg | ORAL_TABLET | Freq: Once | ORAL | Status: AC
  Administered 2013-09-11: 650 mg via ORAL

## 2013-09-11 MED ORDER — ACETAMINOPHEN 325 MG PO TABS
ORAL_TABLET | ORAL | Status: AC
  Filled 2013-09-11: qty 2

## 2013-09-11 MED ORDER — DIPHENHYDRAMINE HCL 25 MG PO CAPS
25.0000 mg | ORAL_CAPSULE | Freq: Once | ORAL | Status: AC
  Administered 2013-09-11: 25 mg via ORAL

## 2013-09-11 MED ORDER — DIPHENHYDRAMINE HCL 25 MG PO CAPS
ORAL_CAPSULE | ORAL | Status: AC
  Filled 2013-09-11: qty 1

## 2013-09-11 MED ORDER — SODIUM CHLORIDE 0.9 % IV SOLN
375.0000 mg/m2 | Freq: Once | INTRAVENOUS | Status: AC
  Administered 2013-09-11: 600 mg via INTRAVENOUS
  Filled 2013-09-11: qty 60

## 2013-09-11 MED ORDER — SODIUM CHLORIDE 0.9 % IV SOLN
Freq: Once | INTRAVENOUS | Status: AC
  Administered 2013-09-11: 10:00:00 via INTRAVENOUS

## 2013-09-11 NOTE — Telephone Encounter (Signed)
GV AND PRINTED PAPT SCHED AND AVS FOR PT FOR MARCH 2015   .Marland KitchenMarland KitchenSED ADDED TX.

## 2013-09-11 NOTE — Patient Instructions (Signed)

## 2013-09-11 NOTE — Patient Instructions (Signed)
[facility] Discharge Instructions for Patients Receiving Chemotherapy  Today you received the following chemotherapy agents Rituxan.  To help prevent nausea and vomiting after your treatment, we encourage you to take your nausea medication as prescribed.   If you develop nausea and vomiting that is not controlled by your nausea medication, call the clinic.   BELOW ARE SYMPTOMS THAT SHOULD BE REPORTED IMMEDIATELY:  *FEVER GREATER THAN 100.5 F  *CHILLS WITH OR WITHOUT FEVER  NAUSEA AND VOMITING THAT IS NOT CONTROLLED WITH YOUR NAUSEA MEDICATION  *UNUSUAL SHORTNESS OF BREATH  *UNUSUAL BRUISING OR BLEEDING  TENDERNESS IN MOUTH AND THROAT WITH OR WITHOUT PRESENCE OF ULCERS  *URINARY PROBLEMS  *BOWEL PROBLEMS  UNUSUAL RASH Items with * indicate a potential emergency and should be followed up as soon as possible.  Feel free to call the clinic you have any questions or concerns. The clinic phone number is [phone].    

## 2013-09-12 NOTE — Progress Notes (Signed)
[facility] OFFICE PROGRESS NOTE  Judie Petit, MD [address] [place] [address]  DIAGNOSIS: NON-HODGKIN'S LYMPHOMA - Plan: CBC with Differential, Comprehensive metabolic panel, Lactate dehydrogenase  Excoriated rash  Tobacco abuse  Chief Complaint  Patient presents with  . Lymphoma    CURRENT THERAPY:  INTERVAL HISTORY: Brooke Washington 72 y.o. female B-cell NHL is here for follow-up.  Patient is seen, together with husband. She is on maintenance Rituxan since last recurrence in 2010. She was last seen by me on 06/12/2013. She had an CT of C/A/P on 04/02/2013 as noted below.   She continues to have a puritic rash on lower legs which was treated by Dr Jorja Loa recently and continues to improve mildly. She reports significant improvement in her rash since starting Claritin by Dr. Jorja Loa. She denies adenopathy, new or different pain, increased SOB or cough. She smokes 2-3 cigarettes daily and has not been interested in stopping (see prior note regarding smoking cessation). She denies bowel (constipation, diarrhea) or bladder symptoms (dysuria, hematuria). She denies bleeding or fever of recent hospital admission. She endorses compliance to her medical regiment. She remains independent for basic and intermediate ADLs. Her appetite and energy is good.   MEDICAL HISTORY: Past Medical History  Diagnosis Date  . Cancer  APRIL OF 2000    LOW-GRADE NON-HODGKIN'S LYMPHOMA   Oncology History   --Early 2000.  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 She was treated with chlorambucil in 2000.   --2007 - 09/2008.  Treated with single agent Rituxan.   --12/2008.  She had progressive adenopathy in chest and abdomen and repeat biopsy showed low grade follicular and diffuse B cell NHL.   --01/2009 - 07/2009. She required left ureteral stent and was treated with  Treanda/Rituxan x 5 cycles from 02-2009 thru 06-2009.   --06/2009.  On maintenance Rituxan every 3 months without difficulty.  --04/02/2013.    CT CAP demonstrated  stable disease No evidence of lymphoma recurrence within the abdomen or pelvis but new mild right axillary lymphadenopathy without mediastinal adenopathy    INTERIM HISTORY: has NON-HODGKIN'S LYMPHOMA; DEPRESSION; COLONIC POLYPS, HYPERPLASTIC, HX OF; Tobacco abuse; and Excoriated rash on her problem list.    ALLERGIES:  is allergic to codeine; contrast media; iohexol; penicillins; and povidone.  MEDICATIONS: has a current medication list which includes the following prescription(s): cholecalciferol, citalopram, and clobetasol prop crea-coal tar.  SURGICAL HISTORY:  Past Surgical History  Procedure Laterality Date  . Right oophorectomy  1976  . 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 Lymphatics: Denies new lymphadenopathy or easy bruising Neurological:Denies numbness, tingling or new weaknesses Behavioral/Psych: Mood is stable, no new changes  All other systems were reviewed with the patient and are negative.  PHYSICAL EXAMINATION: ECOG PERFORMANCE STATUS: 0 - Asymptomatic  Blood pressure 129/63, pulse 66, temperature 97.6 F (36.4 C), temperature source Oral, resp. rate 18, height 5\' 4"  (1.626 m), weight 132 lb (59.875 kg).  GENERAL:alert, no  distress and comfortable; easily ambulatory SKIN: skin color,  texture, turgor are normal, rashes as noted below.  EYES: normal, Conjunctiva are pink and non-injected, sclera clear OROPHARYNX:no exudate, no erythema and lips, buccal mucosa, and tongue normal  NECK: supple, thyroid normal size, non-tender, without nodularity LYMPH:  no palpable lymphadenopathy in the cervical, axillary or supraclavicular LUNGS: clear to auscultation and percussion with normal breathing effort HEART: regular rate & rhythm and no murmurs and no lower extremity edema ABDOMEN:abdomen soft, non-tender and normal bowel sounds Musculoskeletal:no cyanosis of digits and no clubbing ; mild excoriated areas with mild erythema lower legs R>L (vastly improved since 3 months ago). No drainage without signs of infections or purulence.  NEURO: alert & oriented x 3 with fluent speech, no focal motor/sensory deficits  LABORATORY DATA: Results for orders placed in visit on 09/11/13 (from the past 48 hour(s))  CBC WITH DIFFERENTIAL     Status: Abnormal   Collection Time    09/11/13  8:05 AM      Result Value Range   WBC 6.1  3.9 - 10.3 10e3/uL   NEUT# 3.6  1.5 - 6.5 10e3/uL   HGB 13.2  11.6 - 15.9 g/dL   HCT 45.4  09.8 - 11.9 %   Platelets 264  145 - 400 10e3/uL   MCV 95.1  79.5 - 101.0 fL   MCH 32.5  25.1 - 34.0 pg   MCHC 34.2  31.5 - 36.0 g/dL   RBC 1.47  8.29 - 5.62 10e6/uL   RDW 13.0  11.2 - 14.5 %   lymph# 1.1  0.9 - 3.3 10e3/uL   MONO# 0.8  0.1 - 0.9 10e3/uL   Eosinophils Absolute 0.4  0.0 - 0.5 10e3/uL   Basophils Absolute 0.1  0.0 - 0.1 10e3/uL   NEUT% 59.9  38.4 - 76.8 %   LYMPH% 18.2  14.0 - 49.7 %   MONO% 13.6  0.0 - 14.0 %   EOS% 7.1 (*) 0.0 - 7.0 %   BASO% 1.2  0.0 - 2.0 %   nRBC 0  0 - 0 %  COMPREHENSIVE METABOLIC PANEL (CC13)     Status: Abnormal   Collection Time    09/11/13  8:06 AM      Result Value Range   Sodium 141  136 - 145 mEq/L   Potassium 4.1  3.5 - 5.1 mEq/L   Chloride 102  98 - 109 mEq/L   CO2 27  22 - 29 mEq/L   Glucose 88  70 - 140 mg/dl    BUN 13.0  7.0 - 86.5 mg/dL   Creatinine 0.8  0.6 - 1.1 mg/dL   Total Bilirubin 7.84  0.20 - 1.20 mg/dL   Alkaline Phosphatase 150  40 - 150 U/L   AST 18  5 - 34 U/L   ALT 10  0 - 55 U/L   Total Protein 6.6  6.4 - 8.3 g/dL   Albumin 3.9  3.5 - 5.0 g/dL   Calcium 9.7  8.4 - 69.6 mg/dL   Anion Gap 12 (*) 3 - 11 mEq/L  LACTATE DEHYDROGENASE (CC13)     Status: Abnormal   Collection Time    09/11/13  8:06 AM      Result Value Range   LDH 255 (*) 125 - 245 U/L    Labs:  Lab Results  Component Value Date   WBC 6.1 09/11/2013   HGB 13.2 09/11/2013   HCT 38.6 09/11/2013   MCV 95.1 09/11/2013  PLT 264 09/11/2013   NEUTROABS 3.6 09/11/2013      Chemistry      Component Value Date/Time   NA 141 09/11/2013 0806   NA 142 03/17/2012 0929   K 4.1 09/11/2013 0806   K 3.6 03/17/2012 0929   CL 101 03/28/2013 0826   CL 104 03/17/2012 0929   CO2 27 09/11/2013 0806   CO2 29 03/17/2012 0929   BUN 15.5 09/11/2013 0806   BUN 11 03/17/2012 0929   CREATININE 0.8 09/11/2013 0806   CREATININE 0.76 03/17/2012 0929      Component Value Date/Time   CALCIUM 9.7 09/11/2013 0806   CALCIUM 9.6 03/17/2012 0929   ALKPHOS 150 09/11/2013 0806   ALKPHOS 123* 03/17/2012 0929   AST 18 09/11/2013 0806   AST 18 03/17/2012 0929   ALT 10 09/11/2013 0806   ALT 9 03/17/2012 0929   BILITOT 0.35 09/11/2013 0806   BILITOT 0.5 03/17/2012 0929     Basic Metabolic Panel:  Recent Labs Lab 09/11/13 0806  NA 141  K 4.1  CO2 27  GLUCOSE 88  BUN 15.5  CREATININE 0.8  CALCIUM 9.7   GFR Estimated Creatinine Clearance: 54.9 ml/min (by C-G formula based on Cr of 0.8). Liver Function Tests:  Recent Labs Lab 09/11/13 0806  AST 18  ALT 10  ALKPHOS 150  BILITOT 0.35  PROT 6.6  ALBUMIN 3.9   CBC:  Recent Labs Lab 09/11/13 0805  WBC 6.1  NEUTROABS 3.6  HGB 13.2  HCT 38.6  MCV 95.1  PLT 264     RADIOGRAPHIC STUDIES: CT-scan of abdomen and pelvis (04/02/2013)  Comparison: CT 06/02/2010  CT CHEST  IMPRESSION:  1. New mild  right axillary lymphadenopathy is concerning for lymphoma recurrence.  2. No mediastinal adenopathy.  CT ABDOMEN AND PELVIS  IMPRESSION:  1. No evidence of lymphoma recurrence within the abdomen or pelvis.  2. Please see chest section for new mild right axillary adenopathy  ASSESSMENT: Brooke Washington 72 y.o. female with a history of NON-HODGKIN'S LYMPHOMA - Plan: CBC with Differential, Comprehensive metabolic panel, Lactate dehydrogenase  Excoriated rash  Tobacco abuse  PLAN:  1.B cell NHL, stable.  --She continues to do well clinically. --Last scans as noted above c/w stable disease with the exception of new right axillary nodule.  --We plan to continue Rituxan q 3 months with consideration to increase interval to q 4 months on next visit.  --Obtain CBC, CMP and LDH prior to next visit.   2. Tobacco abuse. --She continues to have tobacco use and has been counseled on prior visits regarding assistance in quitting. She has been offered assistance in cessation and will requests as needed.   3. Puritic rash, improving and nearly resolved.  --She will follow-up with Dr. Jorja Loa as needed. She has been counseled to make an appointment sooner if she has symptoms of infections such as fever or worsening rash.   4. Follow-up. --She will follow-up in 3 months for CBC, CMP and LDH and Rituxan.  Mammogram on 04/17/2013 was negative and repeat in one year.   All questions were answered. The patient knows to call the clinic with any problems, questions or concerns. We can certainly see the patient much sooner if necessary.  I spent 10 minutes counseling the patient face to face. The total time spent in the appointment was 15 minutes.    Royalty Fakhouri, MD 09/12/2013 7:04 AM

## 2013-10-23 DIAGNOSIS — H2510 Age-related nuclear cataract, unspecified eye: Secondary | ICD-10-CM | POA: Diagnosis not present

## 2013-10-23 DIAGNOSIS — H2589 Other age-related cataract: Secondary | ICD-10-CM | POA: Diagnosis not present

## 2013-12-11 ENCOUNTER — Telehealth: Payer: Self-pay | Admitting: Internal Medicine

## 2013-12-11 ENCOUNTER — Encounter (INDEPENDENT_AMBULATORY_CARE_PROVIDER_SITE_OTHER): Payer: Self-pay

## 2013-12-11 ENCOUNTER — Encounter: Payer: Self-pay | Admitting: Pharmacist

## 2013-12-11 ENCOUNTER — Ambulatory Visit (HOSPITAL_BASED_OUTPATIENT_CLINIC_OR_DEPARTMENT_OTHER): Payer: Medicare Other | Admitting: Internal Medicine

## 2013-12-11 ENCOUNTER — Other Ambulatory Visit (HOSPITAL_BASED_OUTPATIENT_CLINIC_OR_DEPARTMENT_OTHER): Payer: Medicare Other

## 2013-12-11 ENCOUNTER — Ambulatory Visit (HOSPITAL_BASED_OUTPATIENT_CLINIC_OR_DEPARTMENT_OTHER): Payer: Medicare Other

## 2013-12-11 ENCOUNTER — Encounter: Payer: Self-pay | Admitting: Internal Medicine

## 2013-12-11 ENCOUNTER — Other Ambulatory Visit: Payer: Self-pay | Admitting: Internal Medicine

## 2013-12-11 VITALS — BP 98/59 | HR 63 | Temp 97.5°F | Resp 20

## 2013-12-11 VITALS — BP 127/64 | HR 71 | Temp 98.3°F | Resp 18 | Ht 64.0 in | Wt 129.9 lb

## 2013-12-11 DIAGNOSIS — Z5112 Encounter for antineoplastic immunotherapy: Secondary | ICD-10-CM

## 2013-12-11 DIAGNOSIS — F17200 Nicotine dependence, unspecified, uncomplicated: Secondary | ICD-10-CM

## 2013-12-11 DIAGNOSIS — R21 Rash and other nonspecific skin eruption: Secondary | ICD-10-CM

## 2013-12-11 DIAGNOSIS — C8589 Other specified types of non-Hodgkin lymphoma, extranodal and solid organ sites: Secondary | ICD-10-CM

## 2013-12-11 DIAGNOSIS — R609 Edema, unspecified: Secondary | ICD-10-CM | POA: Diagnosis not present

## 2013-12-11 LAB — CBC WITH DIFFERENTIAL/PLATELET
BASO%: 0.9 % (ref 0.0–2.0)
Basophils Absolute: 0.1 10*3/uL (ref 0.0–0.1)
EOS ABS: 0.2 10*3/uL (ref 0.0–0.5)
EOS%: 3.7 % (ref 0.0–7.0)
HEMATOCRIT: 39.2 % (ref 34.8–46.6)
HGB: 13.7 g/dL (ref 11.6–15.9)
LYMPH#: 0.9 10*3/uL (ref 0.9–3.3)
LYMPH%: 15.6 % (ref 14.0–49.7)
MCH: 32.5 pg (ref 25.1–34.0)
MCHC: 34.9 g/dL (ref 31.5–36.0)
MCV: 92.9 fL (ref 79.5–101.0)
MONO#: 0.8 10*3/uL (ref 0.1–0.9)
MONO%: 13.8 % (ref 0.0–14.0)
NEUT%: 66 % (ref 38.4–76.8)
NEUTROS ABS: 3.8 10*3/uL (ref 1.5–6.5)
PLATELETS: 288 10*3/uL (ref 145–400)
RBC: 4.22 10*6/uL (ref 3.70–5.45)
RDW: 12.9 % (ref 11.2–14.5)
WBC: 5.7 10*3/uL (ref 3.9–10.3)

## 2013-12-11 LAB — COMPREHENSIVE METABOLIC PANEL (CC13)
ALT: 10 U/L (ref 0–55)
ANION GAP: 11 meq/L (ref 3–11)
AST: 17 U/L (ref 5–34)
Albumin: 4.4 g/dL (ref 3.5–5.0)
Alkaline Phosphatase: 139 U/L (ref 40–150)
BILIRUBIN TOTAL: 0.52 mg/dL (ref 0.20–1.20)
BUN: 12.1 mg/dL (ref 7.0–26.0)
CALCIUM: 10.2 mg/dL (ref 8.4–10.4)
CHLORIDE: 105 meq/L (ref 98–109)
CO2: 27 meq/L (ref 22–29)
CREATININE: 0.8 mg/dL (ref 0.6–1.1)
GLUCOSE: 104 mg/dL (ref 70–140)
Potassium: 3.4 mEq/L — ABNORMAL LOW (ref 3.5–5.1)
SODIUM: 143 meq/L (ref 136–145)
TOTAL PROTEIN: 7 g/dL (ref 6.4–8.3)

## 2013-12-11 LAB — LACTATE DEHYDROGENASE (CC13): LDH: 255 U/L — AB (ref 125–245)

## 2013-12-11 MED ORDER — SODIUM CHLORIDE 0.9 % IV SOLN
Freq: Once | INTRAVENOUS | Status: AC
  Administered 2013-12-11: 10:00:00 via INTRAVENOUS

## 2013-12-11 MED ORDER — FUROSEMIDE 20 MG PO TABS: ORAL_TABLET | ORAL | Status: DC

## 2013-12-11 MED ORDER — ACETAMINOPHEN 325 MG PO TABS
ORAL_TABLET | ORAL | Status: AC
  Filled 2013-12-11: qty 2

## 2013-12-11 MED ORDER — DIPHENHYDRAMINE HCL 25 MG PO CAPS
ORAL_CAPSULE | ORAL | Status: AC
  Filled 2013-12-11: qty 1

## 2013-12-11 MED ORDER — DIPHENHYDRAMINE HCL 25 MG PO CAPS
25.0000 mg | ORAL_CAPSULE | Freq: Once | ORAL | Status: AC
  Administered 2013-12-11: 25 mg via ORAL

## 2013-12-11 MED ORDER — SODIUM CHLORIDE 0.9 % IV SOLN
375.0000 mg/m2 | Freq: Once | INTRAVENOUS | Status: AC
  Administered 2013-12-11: 600 mg via INTRAVENOUS
  Filled 2013-12-11: qty 60

## 2013-12-11 MED ORDER — ACETAMINOPHEN 325 MG PO TABS
650.0000 mg | ORAL_TABLET | Freq: Once | ORAL | Status: AC
  Administered 2013-12-11: 650 mg via ORAL

## 2013-12-11 NOTE — Patient Instructions (Addendum)
[facility] Discharge Instructions for Patients Receiving Monoclonal Antibody  Today you received the following antibody agents:  Rituxan  To help prevent nausea and vomiting after your treatment, we encourage you to take your nausea medication as ordered per MD.   If you develop nausea and vomiting that is not controlled by your nausea medication, call the clinic.   BELOW ARE SYMPTOMS THAT SHOULD BE REPORTED IMMEDIATELY:  *FEVER GREATER THAN 100.5 F  *CHILLS WITH OR WITHOUT FEVER  NAUSEA AND VOMITING THAT IS NOT CONTROLLED WITH YOUR NAUSEA MEDICATION  *UNUSUAL SHORTNESS OF BREATH  *UNUSUAL BRUISING OR BLEEDING  TENDERNESS IN MOUTH AND THROAT WITH OR WITHOUT PRESENCE OF ULCERS  *URINARY PROBLEMS  *BOWEL PROBLEMS  UNUSUAL RASH Items with * indicate a potential emergency and should be followed up as soon as possible.  Feel free to call the clinic you have any questions or concerns. The clinic phone number is [phone].

## 2013-12-11 NOTE — Progress Notes (Signed)
[place], [place] [address]  DIAGNOSIS: NON-HODGKIN'S LYMPHOMA - Plan: CBC with Differential, Comprehensive metabolic panel (Cmet) - [facility], Lactate dehydrogenase (LDH) - [facility]  Edema - Plan: furosemide (LASIX) 20 MG tablet  Chief Complaint  Patient presents with  . Lymphoma    CURRENT TREATMENT: Maintenance rituximab every 3 months.   Oncology History   --Early 2000.  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 She was treated with chlorambucil in 2000.   --2007 - 09/2008.  Treated with single agent Rituxan.   --12/2008.  She had progressive adenopathy in chest and abdomen and repeat biopsy showed low grade follicular and diffuse B cell NHL.   --01/2009 - 07/2009. She required left ureteral stent and was treated with Treanda/Rituxan x 5 cycles from 02-2009 thru 06-2009.   --06/2009.  On maintenance Rituxan every 3 months without difficulty.  --04/02/2013.    CT CAP demonstrated  stable disease No evidence of lymphoma recurrence within the abdomen or pelvis but new mild right axillary lymphadenopathy without mediastinal adenopathy      INTERVAL HISTORY: Brooke Washington 73 y.o. female with a history of B-cell NHL is here for follow-up.  She is on maintenance rituxan since her last recurrence in 2010.  She was last seen by me on 09/11/2013.  She denies any fevers or night sweats.  She has been followed by Dr. Denna Haggard for her skin rashes.  They were biopsed in 08/23/2013 and consistent with lichenoid drug eruption although true lichen planus could not be completely excluded histologically. She reports overall improvement in her lower extremity rash.  She uses the clobetasol cream as needed. She denies adenopathy, new or different pain, increased SOB or cough.  She had cataract surgery in Dec/January without  complications. She smokes 2-3 cigarettes daily and has not been interested in stopping.  She remains independent for basic and intermediate ADLs.  Her appetite and energy is good.   MEDICAL HISTORY: Past Medical History  Diagnosis Date  . Cancer  APRIL OF 2000    LOW-GRADE NON-HODGKIN'S LYMPHOMA    INTERIM HISTORY: has NON-HODGKIN'S LYMPHOMA; DEPRESSION; COLONIC POLYPS, HYPERPLASTIC, HX OF; Tobacco abuse; and Excoriated rash on her problem list.    ALLERGIES:  is allergic to codeine; contrast media; iohexol; penicillins; and povidone-iodine.  MEDICATIONS: has a current medication list which includes the following prescription(s): cholecalciferol, citalopram, and furosemide.  SURGICAL HISTORY:  Past Surgical History  Procedure Laterality Date  . Right oophorectomy  1976  . 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  Lymphatics: Denies new lymphadenopathy or easy bruising Neurological:Denies numbness, tingling or new weaknesses Behavioral/Psych: Mood is stable, no new changes  All other systems were reviewed with the patient and are negative.  PHYSICAL EXAMINATION: ECOG PERFORMANCE STATUS: 0 - Asymptomatic  Blood pressure 127/64, pulse 71, temperature 98.3 F (36.8 C), temperature source Oral, resp. rate 18, height 5\' 4"  (1.626 m),  weight 129 lb 14.4 oz (58.922 kg), SpO2 95.00%.  GENERAL:alert, no distress and comfortable SKIN: skin color, texture,  turgor are normal, no rashes or significant lesions EYES: normal, Conjunctiva are pink and non-injected, sclera clear; + peri orbital swelling/puffiness OROPHARYNX:no exudate, no erythema and lips, buccal mucosa, and tongue normal  NECK: supple, thyroid normal size, non-tender, without nodularity LYMPH:  no palpable lymphadenopathy in the cervical, axillary or supraclavicular LUNGS: clear to auscultation with normal breathing effort, no wheezes or rhonchi HEART: regular rate & rhythm and no murmurs and trace extremity edema ABDOMEN:abdomen soft, non-tender and normal bowel sounds Musculoskeletal:no cyanosis of digits and no clubbing  NEURO: alert & oriented x 3 with fluent speech, no focal motor/sensory deficits  Labs:  Lab Results  Component Value Date   WBC 5.7 12/11/2013   HGB 13.7 12/11/2013   HCT 39.2 12/11/2013   MCV 92.9 12/11/2013   PLT 288 12/11/2013   NEUTROABS 3.8 12/11/2013      Chemistry      Component Value Date/Time   NA 141 09/11/2013 0806   NA 142 03/17/2012 0929   K 4.1 09/11/2013 0806   K 3.6 03/17/2012 0929   CL 101 03/28/2013 0826   CL 104 03/17/2012 0929   CO2 27 09/11/2013 0806   CO2 29 03/17/2012 0929   BUN 15.5 09/11/2013 0806   BUN 11 03/17/2012 0929   CREATININE 0.8 09/11/2013 0806   CREATININE 0.76 03/17/2012 0929      Component Value Date/Time   CALCIUM 9.7 09/11/2013 0806   CALCIUM 9.6 03/17/2012 0929   ALKPHOS 150 09/11/2013 0806   ALKPHOS 123* 03/17/2012 0929   AST 18 09/11/2013 0806   AST 18 03/17/2012 0929   ALT 10 09/11/2013 0806   ALT 9 03/17/2012 0929   BILITOT 0.35 09/11/2013 0806   BILITOT 0.5 03/17/2012 0929       CBC:  Recent Labs Lab 12/11/13 0758  WBC 5.7  NEUTROABS 3.8  HGB 13.7  HCT 39.2  MCV 92.9  PLT 288    Studies:  No results found.   RADIOGRAPHIC STUDIES: No results found.  ASSESSMENT: Brooke Washington 73 y.o. female with a history of NON-HODGKIN'S LYMPHOMA - Plan: CBC with Differential, Comprehensive metabolic panel (Cmet) - [facility],  Lactate dehydrogenase (LDH) - [facility]  Edema - Plan: furosemide (LASIX) 20 MG tablet   PLAN:  1.B cell NHL, stable.  --She continues to do well clinically.  --Last scans  On 04/03/2013  c/w stable disease with the exception of new right axillary nodule. Patient reports that her insurance will not cover annual scans and requested scans to be done every 2 years or based on symptoms.  --We plan to continue Rituxan q 3 months.  --Obtain CBC, CMP and LDH prior to next visit.   2. Tobacco abuse.  --She continues to have tobacco use and has been counseled on prior visits regarding assistance in quitting. She has been offered assistance in cessation and will requests as needed.   3. Puritic rash, nearly resolved.  --She will follow-up with Dr. Denna Haggard as needed. She has been counseled to make an appointment sooner if she has symptoms of infections such as fever or worsening rash.   4. Follow-up.  --She will follow-up in 3 months for CBC, CMP and LDH and Rituxan. Mammogram on 04/17/2013 was negative and repeat this year.   5. Mild edema. --patient reports being on lasix 20 mg every other day prn for swelling.  We will resume lasix  20 mg every 3 days prn for swelling.  Patient counseled to report any symptoms of low potassium such as muscle twitching.   All questions were answered. The patient knows to call the clinic with any problems, questions or concerns. We can certainly see the patient much sooner if necessary.  I spent 10 minutes counseling the patient face to face. The total time spent in the appointment was 15 minutes.    Raima Geathers, MD 12/11/2013 8:53 AM

## 2013-12-11 NOTE — Telephone Encounter (Signed)
gv adn printed appt sched and avs for pt for May....sed added tx. °

## 2013-12-19 ENCOUNTER — Encounter: Payer: Self-pay | Admitting: Internal Medicine

## 2014-01-09 ENCOUNTER — Telehealth: Payer: Self-pay | Admitting: Internal Medicine

## 2014-01-09 MED ORDER — CITALOPRAM HYDROBROMIDE 40 MG PO TABS: 40.0000 mg | ORAL_TABLET | Freq: Every day | ORAL | Status: DC

## 2014-01-09 NOTE — Telephone Encounter (Signed)
rx sent in electronically 

## 2014-01-09 NOTE — Telephone Encounter (Signed)
[facility], [place] requesting re-fill on citalopram (CELEXA) 40 MG tablet

## 2014-03-05 ENCOUNTER — Ambulatory Visit (HOSPITAL_BASED_OUTPATIENT_CLINIC_OR_DEPARTMENT_OTHER): Payer: Medicare Other | Admitting: Internal Medicine

## 2014-03-05 ENCOUNTER — Telehealth: Payer: Self-pay | Admitting: Internal Medicine

## 2014-03-05 ENCOUNTER — Ambulatory Visit (HOSPITAL_BASED_OUTPATIENT_CLINIC_OR_DEPARTMENT_OTHER): Payer: Medicare Other

## 2014-03-05 ENCOUNTER — Other Ambulatory Visit (HOSPITAL_BASED_OUTPATIENT_CLINIC_OR_DEPARTMENT_OTHER): Payer: Medicare Other

## 2014-03-05 VITALS — BP 121/66 | HR 64 | Temp 97.8°F | Resp 16

## 2014-03-05 VITALS — BP 134/66 | HR 65 | Temp 97.6°F | Resp 18 | Ht 64.0 in | Wt 128.2 lb

## 2014-03-05 DIAGNOSIS — Z5112 Encounter for antineoplastic immunotherapy: Secondary | ICD-10-CM

## 2014-03-05 DIAGNOSIS — R599 Enlarged lymph nodes, unspecified: Secondary | ICD-10-CM

## 2014-03-05 DIAGNOSIS — F17200 Nicotine dependence, unspecified, uncomplicated: Secondary | ICD-10-CM

## 2014-03-05 DIAGNOSIS — C8589 Other specified types of non-Hodgkin lymphoma, extranodal and solid organ sites: Secondary | ICD-10-CM

## 2014-03-05 DIAGNOSIS — R609 Edema, unspecified: Secondary | ICD-10-CM

## 2014-03-05 LAB — COMPREHENSIVE METABOLIC PANEL (CC13)
ALBUMIN: 3.8 g/dL (ref 3.5–5.0)
ALK PHOS: 104 U/L (ref 40–150)
ALT: 11 U/L (ref 0–55)
AST: 16 U/L (ref 5–34)
Anion Gap: 12 mEq/L — ABNORMAL HIGH (ref 3–11)
BUN: 10.3 mg/dL (ref 7.0–26.0)
CALCIUM: 9.7 mg/dL (ref 8.4–10.4)
CHLORIDE: 106 meq/L (ref 98–109)
CO2: 25 mEq/L (ref 22–29)
Creatinine: 0.7 mg/dL (ref 0.6–1.1)
Glucose: 87 mg/dl (ref 70–140)
POTASSIUM: 4.2 meq/L (ref 3.5–5.1)
SODIUM: 142 meq/L (ref 136–145)
TOTAL PROTEIN: 6.3 g/dL — AB (ref 6.4–8.3)
Total Bilirubin: 0.41 mg/dL (ref 0.20–1.20)

## 2014-03-05 LAB — CBC WITH DIFFERENTIAL/PLATELET
BASO%: 1.2 % (ref 0.0–2.0)
BASOS ABS: 0.1 10*3/uL (ref 0.0–0.1)
EOS%: 3.9 % (ref 0.0–7.0)
Eosinophils Absolute: 0.2 10*3/uL (ref 0.0–0.5)
HCT: 38.4 % (ref 34.8–46.6)
HEMOGLOBIN: 13.1 g/dL (ref 11.6–15.9)
LYMPH#: 0.8 10*3/uL — AB (ref 0.9–3.3)
LYMPH%: 13.2 % — ABNORMAL LOW (ref 14.0–49.7)
MCH: 32.6 pg (ref 25.1–34.0)
MCHC: 34 g/dL (ref 31.5–36.0)
MCV: 95.9 fL (ref 79.5–101.0)
MONO#: 0.7 10*3/uL (ref 0.1–0.9)
MONO%: 12.1 % (ref 0.0–14.0)
NEUT#: 4 10*3/uL (ref 1.5–6.5)
NEUT%: 69.6 % (ref 38.4–76.8)
Platelets: 254 10*3/uL (ref 145–400)
RBC: 4 10*6/uL (ref 3.70–5.45)
RDW: 13.4 % (ref 11.2–14.5)
WBC: 5.8 10*3/uL (ref 3.9–10.3)

## 2014-03-05 LAB — LACTATE DEHYDROGENASE (CC13): LDH: 249 U/L — ABNORMAL HIGH (ref 125–245)

## 2014-03-05 MED ORDER — ACETAMINOPHEN 325 MG PO TABS
ORAL_TABLET | ORAL | Status: AC
  Filled 2014-03-05: qty 2

## 2014-03-05 MED ORDER — SODIUM CHLORIDE 0.9 % IV SOLN
375.0000 mg/m2 | Freq: Once | INTRAVENOUS | Status: AC
  Administered 2014-03-05: 600 mg via INTRAVENOUS
  Filled 2014-03-05: qty 60

## 2014-03-05 MED ORDER — DIPHENHYDRAMINE HCL 25 MG PO CAPS
25.0000 mg | ORAL_CAPSULE | Freq: Once | ORAL | Status: AC
  Administered 2014-03-05: 25 mg via ORAL

## 2014-03-05 MED ORDER — DIPHENHYDRAMINE HCL 25 MG PO CAPS
ORAL_CAPSULE | ORAL | Status: AC
  Filled 2014-03-05: qty 1

## 2014-03-05 MED ORDER — ACETAMINOPHEN 325 MG PO TABS
650.0000 mg | ORAL_TABLET | Freq: Once | ORAL | Status: AC
  Administered 2014-03-05: 650 mg via ORAL

## 2014-03-05 MED ORDER — FUROSEMIDE 20 MG PO TABS
ORAL_TABLET | ORAL | Status: DC
Start: 2014-03-05 — End: 2014-09-19

## 2014-03-05 MED ORDER — SODIUM CHLORIDE 0.9 % IV SOLN
Freq: Once | INTRAVENOUS | Status: AC
  Administered 2014-03-05: 11:00:00 via INTRAVENOUS

## 2014-03-05 NOTE — Progress Notes (Signed)
[place], [place] [address]  DIAGNOSIS: Edema - Plan: furosemide (LASIX) 20 MG tablet, CBC with Differential, Comprehensive metabolic panel (Cmet) - [facility], Lactate dehydrogenase (LDH) - [facility]  Chief Complaint  Patient presents with  . Follow-up    CURRENT TREATMENT: Maintenance rituximab every 3 months.   Oncology History   --Early 2000.  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 She was treated with chlorambucil in 2000.   --2007 - 09/2008.  Treated with single agent Rituxan.   --12/2008.  She had progressive adenopathy in chest and abdomen and repeat biopsy showed low grade follicular and diffuse B cell NHL.   --01/2009 - 07/2009. She required left ureteral stent and was treated with Treanda/Rituxan x 5 cycles from 02-2009 thru 06-2009.   --06/2009.  On maintenance Rituxan every 3 months without difficulty.  --04/02/2013.    CT CAP demonstrated  stable disease No evidence of lymphoma recurrence within the abdomen or pelvis but new mild right axillary lymphadenopathy without mediastinal adenopathy      INTERVAL HISTORY: Brooke Washington 73 y.o. female with a history of B-cell NHL is here for follow-up.  She is on maintenance rituxan since her last recurrence in 2010.  She was last seen by me on 13/12/2013  She denies any fevers or night sweats.  She denies rashes presently. She denies adenopathy, new or different pain, increased SOB or cough.  She had cataract surgery in Dec/January without complications. She smokes 2-3 cigarettes daily and has not been interested in stopping.  She remains independent for basic and intermediate ADLs.  Her appetite and energy is good.   MEDICAL HISTORY: Past Medical History  Diagnosis Date  . Cancer  APRIL OF 2000    LOW-GRADE NON-HODGKIN'S LYMPHOMA    INTERIM  HISTORY: has NON-HODGKIN'S LYMPHOMA; DEPRESSION; COLONIC POLYPS, HYPERPLASTIC, HX OF; Tobacco abuse; and Excoriated rash on her problem list.    ALLERGIES:  is allergic to contrast media; iohexol; penicillins; and povidone-iodine.  MEDICATIONS: has a current medication list which includes the following prescription(s): cholecalciferol, citalopram, and furosemide.  SURGICAL HISTORY:  Past Surgical History  Procedure Laterality Date  . Right oophorectomy  1976  . 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  Lymphatics: Denies new lymphadenopathy or easy bruising Neurological:Denies numbness, tingling or new weaknesses Behavioral/Psych: Mood is stable, no new changes  All other systems were reviewed with the patient and are negative.  PHYSICAL EXAMINATION: ECOG PERFORMANCE STATUS: 0 - Asymptomatic  Blood pressure 134/66, pulse 65, temperature 97.6 F (36.4 C), temperature source Oral, resp. rate 18, height 5\' 4"  (1.626 m), weight 128 lb 3.2 oz (58.151 kg).  GENERAL:alert, no distress and comfortable; well developed and well nourished.  SKIN: skin color, texture, turgor are normal, no rashes or significant lesions EYES: normal, Conjunctiva are pink and non-injected, sclera clear OROPHARYNX:no exudate, no erythema and lips, buccal mucosa, and tongue normal  NECK: supple, thyroid normal size, non-tender, without nodularity LYMPH:  no palpable lymphadenopathy in  the cervical, axillary or supraclavicular LUNGS: clear to auscultation with normal breathing effort, no wheezes or rhonchi HEART: regular rate & rhythm and no murmurs and trace extremity edema ABDOMEN:abdomen soft, non-tender and normal bowel sounds Musculoskeletal:no cyanosis of digits and no clubbing  NEURO: alert & oriented x 3 with fluent speech, no focal motor/sensory deficits  Labs:  Lab Results  Component Value Date   WBC 5.8 03/05/2014   HGB 13.1 03/05/2014   HCT 38.4 03/05/2014   MCV 95.9 03/05/2014   PLT 254 03/05/2014   NEUTROABS 4.0 03/05/2014      Chemistry      Component Value Date/Time   NA 142 03/05/2014 0853   NA 142 03/17/2012 0929   K 4.2 03/05/2014 0853   K 3.6 03/17/2012 0929   CL 101 03/28/2013 0826   CL 104 03/17/2012 0929   CO2 25 03/05/2014 0853   CO2 29 03/17/2012 0929   BUN 10.3 03/05/2014 0853   BUN 11 03/17/2012 0929   CREATININE 0.7 03/05/2014 0853   CREATININE 0.76 03/17/2012 0929      Component Value Date/Time   CALCIUM 9.7 03/05/2014 0853   CALCIUM 9.6 03/17/2012 0929   ALKPHOS 104 03/05/2014 0853   ALKPHOS 123* 03/17/2012 0929   AST 16 03/05/2014 0853   AST 18 03/17/2012 0929   ALT 11 03/05/2014 0853   ALT 9 03/17/2012 0929   BILITOT 0.41 03/05/2014 0853   BILITOT 0.5 03/17/2012 0929       CBC:  Recent Labs Lab 03/05/14 0853  WBC 5.8  NEUTROABS 4.0  HGB 13.1  HCT 38.4  MCV 95.9  PLT 254    Studies:  No results found.   RADIOGRAPHIC STUDIES: No results found.  ASSESSMENT: Brooke Washington 73 y.o. female with a history of Edema - Plan: furosemide (LASIX) 20 MG tablet, CBC with Differential, Comprehensive metabolic panel (Cmet) - [facility], Lactate dehydrogenase (LDH) - [facility]   PLAN:  1.B cell NHL, stable.  --She continues to do well clinically.  --Last scans  On 04/03/2013  c/w stable disease with the exception of new right axillary nodule. Patient reports that her insurance will not cover annual scans and requested scans to be done every 2 years or  based on symptoms.  --We plan to continue Rituxan q 3 months. Next one on 06/11/2014. --Obtain CBC, CMP and LDH prior to next visit.   2. Tobacco abuse.  --She continues to have tobacco use and has been counseled on prior visits regarding assistance in quitting. She has been offered assistance in cessation and will requests as needed.   3. Follow-up.  --She will follow-up in 3 months for CBC, CMP and LDH and Rituxan. Mammogram on 04/17/2013 was negative and repeat this year.   4. Mild edema. --Patient reports being on lasix 20 mg every other day prn for swelling.  We will resume lasix 20 mg every 3 days prn for swelling.  Patient counseled to report any symptoms of low potassium such as muscle twitching.   All questions were answered. The patient knows to call the clinic with any problems, questions or concerns. We can certainly see the patient much sooner if necessary.  I spent 10 minutes counseling the patient face to face. The total time spent in the appointment was 15 minutes.    Concha Norway, MD 03/05/2014 10:26 AM

## 2014-03-05 NOTE — Telephone Encounter (Signed)
pt given appt schedule for sept while in inf.

## 2014-03-05 NOTE — Patient Instructions (Signed)
[facility] Discharge Instructions for Patients Receiving Chemotherapy  Today you received the following chemotherapy agents rituxan.   To help prevent nausea and vomiting after your treatment, we encourage you to take your nausea medication as directed.     If you develop nausea and vomiting that is not controlled by your nausea medication, call the clinic.   BELOW ARE SYMPTOMS THAT SHOULD BE REPORTED IMMEDIATELY:  *FEVER GREATER THAN 100.5 F  *CHILLS WITH OR WITHOUT FEVER  NAUSEA AND VOMITING THAT IS NOT CONTROLLED WITH YOUR NAUSEA MEDICATION  *UNUSUAL SHORTNESS OF BREATH  *UNUSUAL BRUISING OR BLEEDING  TENDERNESS IN MOUTH AND THROAT WITH OR WITHOUT PRESENCE OF ULCERS  *URINARY PROBLEMS  *BOWEL PROBLEMS  UNUSUAL RASH Items with * indicate a potential emergency and should be followed up as soon as possible.  Feel free to call the clinic you have any questions or concerns. The clinic phone number is [phone].  

## 2014-05-21 DIAGNOSIS — Z1231 Encounter for screening mammogram for malignant neoplasm of breast: Secondary | ICD-10-CM | POA: Diagnosis not present

## 2014-05-28 ENCOUNTER — Encounter: Payer: Self-pay | Admitting: Internal Medicine

## 2014-06-11 ENCOUNTER — Ambulatory Visit (HOSPITAL_BASED_OUTPATIENT_CLINIC_OR_DEPARTMENT_OTHER): Payer: Medicare Other

## 2014-06-11 ENCOUNTER — Ambulatory Visit (HOSPITAL_BASED_OUTPATIENT_CLINIC_OR_DEPARTMENT_OTHER): Payer: Medicare Other | Admitting: Hematology

## 2014-06-11 ENCOUNTER — Other Ambulatory Visit: Payer: Self-pay | Admitting: *Deleted

## 2014-06-11 ENCOUNTER — Encounter: Payer: Self-pay | Admitting: Hematology

## 2014-06-11 ENCOUNTER — Other Ambulatory Visit (HOSPITAL_BASED_OUTPATIENT_CLINIC_OR_DEPARTMENT_OTHER): Payer: Medicare Other

## 2014-06-11 VITALS — BP 131/80 | HR 69 | Temp 98.0°F | Resp 18 | Ht 64.0 in | Wt 126.5 lb

## 2014-06-11 VITALS — BP 118/59 | HR 66 | Temp 97.8°F | Resp 18

## 2014-06-11 DIAGNOSIS — C8589 Other specified types of non-Hodgkin lymphoma, extranodal and solid organ sites: Secondary | ICD-10-CM | POA: Diagnosis not present

## 2014-06-11 DIAGNOSIS — C8590 Non-Hodgkin lymphoma, unspecified, unspecified site: Secondary | ICD-10-CM

## 2014-06-11 DIAGNOSIS — Z5112 Encounter for antineoplastic immunotherapy: Secondary | ICD-10-CM

## 2014-06-11 DIAGNOSIS — Z23 Encounter for immunization: Secondary | ICD-10-CM

## 2014-06-11 DIAGNOSIS — R609 Edema, unspecified: Secondary | ICD-10-CM

## 2014-06-11 LAB — COMPREHENSIVE METABOLIC PANEL (CC13)
ALBUMIN: 3.9 g/dL (ref 3.5–5.0)
ALT: 12 U/L (ref 0–55)
ANION GAP: 11 meq/L (ref 3–11)
AST: 15 U/L (ref 5–34)
Alkaline Phosphatase: 103 U/L (ref 40–150)
BUN: 15.1 mg/dL (ref 7.0–26.0)
CO2: 25 meq/L (ref 22–29)
Calcium: 9.6 mg/dL (ref 8.4–10.4)
Chloride: 105 mEq/L (ref 98–109)
Creatinine: 0.8 mg/dL (ref 0.6–1.1)
Glucose: 84 mg/dl (ref 70–140)
Potassium: 3.6 mEq/L (ref 3.5–5.1)
SODIUM: 141 meq/L (ref 136–145)
TOTAL PROTEIN: 6.7 g/dL (ref 6.4–8.3)
Total Bilirubin: 0.37 mg/dL (ref 0.20–1.20)

## 2014-06-11 LAB — CBC WITH DIFFERENTIAL/PLATELET
BASO%: 0.7 % (ref 0.0–2.0)
Basophils Absolute: 0 10*3/uL (ref 0.0–0.1)
EOS%: 4.2 % (ref 0.0–7.0)
Eosinophils Absolute: 0.2 10*3/uL (ref 0.0–0.5)
HCT: 41.9 % (ref 34.8–46.6)
HEMOGLOBIN: 13.9 g/dL (ref 11.6–15.9)
LYMPH%: 17.6 % (ref 14.0–49.7)
MCH: 32.6 pg (ref 25.1–34.0)
MCHC: 33.2 g/dL (ref 31.5–36.0)
MCV: 98.1 fL (ref 79.5–101.0)
MONO#: 0.6 10*3/uL (ref 0.1–0.9)
MONO%: 10.2 % (ref 0.0–14.0)
NEUT%: 67.3 % (ref 38.4–76.8)
NEUTROS ABS: 3.7 10*3/uL (ref 1.5–6.5)
PLATELETS: 274 10*3/uL (ref 145–400)
RBC: 4.27 10*6/uL (ref 3.70–5.45)
RDW: 13 % (ref 11.2–14.5)
WBC: 5.5 10*3/uL (ref 3.9–10.3)
lymph#: 1 10*3/uL (ref 0.9–3.3)

## 2014-06-11 LAB — LACTATE DEHYDROGENASE (CC13): LDH: 258 U/L — AB (ref 125–245)

## 2014-06-11 MED ORDER — SODIUM CHLORIDE 0.9 % IV SOLN
Freq: Once | INTRAVENOUS | Status: AC
  Administered 2014-06-11: 10:00:00 via INTRAVENOUS

## 2014-06-11 MED ORDER — SODIUM CHLORIDE 0.9 % IV SOLN
375.0000 mg/m2 | Freq: Once | INTRAVENOUS | Status: AC
  Administered 2014-06-11: 600 mg via INTRAVENOUS
  Filled 2014-06-11: qty 60

## 2014-06-11 MED ORDER — ACETAMINOPHEN 325 MG PO TABS
650.0000 mg | ORAL_TABLET | Freq: Once | ORAL | Status: AC
  Administered 2014-06-11: 650 mg via ORAL

## 2014-06-11 MED ORDER — DIPHENHYDRAMINE HCL 25 MG PO CAPS
25.0000 mg | ORAL_CAPSULE | Freq: Once | ORAL | Status: AC
  Administered 2014-06-11: 25 mg via ORAL

## 2014-06-11 MED ORDER — DIPHENHYDRAMINE HCL 25 MG PO CAPS
ORAL_CAPSULE | ORAL | Status: AC
  Filled 2014-06-11: qty 1

## 2014-06-11 MED ORDER — INFLUENZA VAC SPLIT QUAD 0.5 ML IM SUSY
0.5000 mL | PREFILLED_SYRINGE | Freq: Once | INTRAMUSCULAR | Status: AC
  Administered 2014-06-11: 0.5 mL via INTRAMUSCULAR
  Filled 2014-06-11: qty 0.5

## 2014-06-11 MED ORDER — ACETAMINOPHEN 325 MG PO TABS
ORAL_TABLET | ORAL | Status: AC
  Filled 2014-06-11: qty 2

## 2014-06-11 NOTE — Patient Instructions (Signed)
[facility] Discharge Instructions for Patients Receiving Chemotherapy  Today you received the following chemotherapy agents Rituxan  To help prevent nausea and vomiting after your treatment, we encourage you to take your nausea medication as directed.   If you develop nausea and vomiting that is not controlled by your nausea medication, call the clinic.   BELOW ARE SYMPTOMS THAT SHOULD BE REPORTED IMMEDIATELY:  *FEVER GREATER THAN 100.5 F  *CHILLS WITH OR WITHOUT FEVER  NAUSEA AND VOMITING THAT IS NOT CONTROLLED WITH YOUR NAUSEA MEDICATION  *UNUSUAL SHORTNESS OF BREATH  *UNUSUAL BRUISING OR BLEEDING  TENDERNESS IN MOUTH AND THROAT WITH OR WITHOUT PRESENCE OF ULCERS  *URINARY PROBLEMS  *BOWEL PROBLEMS  UNUSUAL RASH Items with * indicate a potential emergency and should be followed up as soon as possible.  Feel free to call the clinic you have any questions or concerns. The clinic phone number is [phone].    

## 2014-06-11 NOTE — Progress Notes (Signed)
[place], [place] [address]  DIAGNOSIS: Non-hodgkin Lymphoma (NHL) on maintenance Rituxan therapy.  Chief Complaint  Patient presents with  . Follow-up    CURRENT TREATMENT: Maintenance rituximab every 3 months.   Oncology History   --Early 2000.  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 She was treated with chlorambucil in 2000.   --2007 - 09/2008.  Treated with single agent Rituxan.   --12/2008.  She had progressive adenopathy in chest and abdomen and repeat biopsy showed low grade follicular and diffuse B cell NHL.   --01/2009 - 07/2009. She required left ureteral stent and was treated with Treanda/Rituxan x 5 cycles from 02-2009 thru 06-2009.   --06/2009.  On maintenance Rituxan every 3 months without difficulty.  --04/02/2013.    CT CAP demonstrated  stable disease No evidence of lymphoma recurrence within the abdomen or pelvis but new mild right axillary lymphadenopathy without mediastinal adenopathy      INTERVAL HISTORY: Brooke Washington 73 y.o. female from [place] a history of B-cell NHL is here for follow-up.  She is on maintenance rituxan since her last recurrence in 2010.  She was last seen by Dr Elson Areas on 03/05/2014.  She denies any fevers or night sweats.  She denies rashes presently. She denies adenopathy, new or different pain, increased SOB or cough.  She had cataract surgery in Dec/January without complications. She smokes 2-3 cigarettes daily and has not been interested in stopping.  She remains independent for basic and intermediate ADLs.  Her appetite and energy is good. Her husband about 10 days ago had an open heart surgery and she is helping him in recovery. No infections recently. She will get a flu shot here today.  MEDICAL HISTORY: Past Medical History  Diagnosis  Date  . Cancer  APRIL OF 2000    LOW-GRADE NON-HODGKIN'S LYMPHOMA    INTERIM HISTORY: has NON-HODGKIN'S LYMPHOMA; DEPRESSION; COLONIC POLYPS, HYPERPLASTIC, HX OF; Tobacco abuse; and Excoriated rash on her problem list.    ALLERGIES:  is allergic to contrast media; iohexol; povidone-iodine; and penicillins.  MEDICATIONS: has a current medication list which includes the following prescription(s): cholecalciferol, citalopram, and furosemide, and the following Facility-Administered Medications: influenza vac split quadrivalent pf.  SURGICAL HISTORY:  Past Surgical History  Procedure Laterality Date  . Right oophorectomy  1976  . 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  Lymphatics: Denies new lymphadenopathy or easy bruising Neurological:Denies numbness, tingling or new weaknesses Behavioral/Psych: Mood is stable, no new changes  All other systems were reviewed with the patient and are negative.  PHYSICAL EXAMINATION: ECOG PERFORMANCE STATUS: 0 - Asymptomatic  Blood pressure 131/80, pulse 69, temperature 98 F (36.7 C), temperature source Oral, resp. rate 18, height 5\' 4"  (1.626 m), weight 126 lb 8 oz (57.38 kg).  GENERAL:alert, no distress and comfortable; well developed and well nourished.  SKIN: skin color, texture, turgor are normal, no  rashes or significant lesions EYES: normal, Conjunctiva are pink and non-injected, sclera clear OROPHARYNX:no  exudate, no erythema and lips, buccal mucosa, and tongue normal  NECK: supple, thyroid normal size, non-tender, without nodularity LYMPH:  no palpable lymphadenopathy in the cervical, axillary or supraclavicular LUNGS: clear to auscultation with normal breathing effort, no wheezes or rhonchi HEART: regular rate & rhythm and no murmurs and trace extremity edema ABDOMEN:abdomen soft, non-tender and normal bowel sounds Musculoskeletal:no cyanosis of digits and no clubbing  NEURO: alert & oriented x 3 with fluent speech, no focal motor/sensory deficits  Labs:  Lab Results  Component Value Date   WBC 5.5 06/11/2014   HGB 13.9 06/11/2014   HCT 41.9 06/11/2014   MCV 98.1 06/11/2014   PLT 274 06/11/2014   NEUTROABS 3.7 06/11/2014      Chemistry      Component Value Date/Time   NA 141 06/11/2014 0802   NA 142 03/17/2012 0929   K 3.6 06/11/2014 0802   K 3.6 03/17/2012 0929   CL 101 03/28/2013 0826   CL 104 03/17/2012 0929   CO2 25 06/11/2014 0802   CO2 29 03/17/2012 0929   BUN 15.1 06/11/2014 0802   BUN 11 03/17/2012 0929   CREATININE 0.8 06/11/2014 0802   CREATININE 0.76 03/17/2012 0929      Component Value Date/Time   CALCIUM 9.6 06/11/2014 0802   CALCIUM 9.6 03/17/2012 0929   ALKPHOS 103 06/11/2014 0802   ALKPHOS 123* 03/17/2012 0929   AST 15 06/11/2014 0802   AST 18 03/17/2012 0929   ALT 12 06/11/2014 0802   ALT 9 03/17/2012 0929   BILITOT 0.37 06/11/2014 0802   BILITOT 0.5 03/17/2012 0929        RADIOGRAPHIC STUDIES:  Mammogram 05/21/2014 was normal and benign  ASSESSMENT: Brooke Washington 73 y.o. female with a history of NHL in remission.  PLAN:  1.B cell NHL, stable.  --She continues to do well clinically.  --Last scans  On 04/03/2013  c/w stable disease with the exception of new right axillary nodule. Patient reports that her insurance will not cover annual scans and requested scans to be done every 2 years or based on symptoms.  --We plan to continue Rituxan q 3 months. Next one on 09/03/2014. --Obtain CBC,  CMP and LDH prior to next visit.   2. Tobacco abuse.  --She continues to have tobacco use and has been counseled on prior visits regarding assistance in quitting. She has been offered assistance in cessation and will requests as needed.   3. Follow-up.  --She will follow-up in 3 months for CBC, CMP and LDH and Rituxan.   4. Mild edema. --Patient reports being on lasix 20 mg every other day prn for swelling.  We will resume lasix 20 mg every 3 days prn for swelling.  Patient counseled to report any symptoms of low potassium such as muscle twitching.   5. She gets flu shot today.   All questions were answered. The patient knows to call the clinic with any problems, questions or concerns. We can certainly see the patient much sooner if necessary.  I spent 15 minutes counseling the patient face to face. The total time spent in the appointment was 20 minutes.  Bernadene Bell, MD Medical Hematologist/Oncologist [place] Pager: [phone] Office No: [phone]

## 2014-06-12 ENCOUNTER — Telehealth: Payer: Self-pay | Admitting: Hematology

## 2014-06-12 NOTE — Telephone Encounter (Signed)
Pt cld lft msg to get new sch for Dec, confirmed labs/ov/chemo per 09/01 POF, sent msg to add chemo,.......KJ

## 2014-06-13 ENCOUNTER — Telehealth: Payer: Self-pay | Admitting: *Deleted

## 2014-06-13 NOTE — Telephone Encounter (Signed)
Per staff message and POF I have scheduled appts. Advised scheduler of appts. JMW  

## 2014-06-26 ENCOUNTER — Encounter: Payer: Self-pay | Admitting: Internal Medicine

## 2014-09-04 ENCOUNTER — Telehealth: Payer: Self-pay | Admitting: Hematology and Oncology

## 2014-09-04 NOTE — Telephone Encounter (Signed)
Confirm appt d/t for 09/11/14.

## 2014-09-11 ENCOUNTER — Ambulatory Visit (HOSPITAL_BASED_OUTPATIENT_CLINIC_OR_DEPARTMENT_OTHER): Payer: Medicare Other | Admitting: Hematology and Oncology

## 2014-09-11 ENCOUNTER — Telehealth: Payer: Self-pay | Admitting: Hematology and Oncology

## 2014-09-11 ENCOUNTER — Ambulatory Visit (HOSPITAL_BASED_OUTPATIENT_CLINIC_OR_DEPARTMENT_OTHER): Payer: Medicare Other

## 2014-09-11 ENCOUNTER — Other Ambulatory Visit (HOSPITAL_BASED_OUTPATIENT_CLINIC_OR_DEPARTMENT_OTHER): Payer: Medicare Other

## 2014-09-11 ENCOUNTER — Encounter: Payer: Self-pay | Admitting: Hematology and Oncology

## 2014-09-11 VITALS — BP 138/62 | HR 68 | Temp 97.6°F | Resp 18 | Ht 64.0 in | Wt 130.8 lb

## 2014-09-11 DIAGNOSIS — C8590 Non-Hodgkin lymphoma, unspecified, unspecified site: Secondary | ICD-10-CM

## 2014-09-11 DIAGNOSIS — R740 Nonspecific elevation of levels of transaminase and lactic acid dehydrogenase [LDH]: Secondary | ICD-10-CM | POA: Diagnosis not present

## 2014-09-11 DIAGNOSIS — Z720 Tobacco use: Secondary | ICD-10-CM

## 2014-09-11 DIAGNOSIS — Z5112 Encounter for antineoplastic immunotherapy: Secondary | ICD-10-CM

## 2014-09-11 LAB — COMPREHENSIVE METABOLIC PANEL (CC13)
ALT: 9 U/L (ref 0–55)
ANION GAP: 12 meq/L — AB (ref 3–11)
AST: 16 U/L (ref 5–34)
Albumin: 3.9 g/dL (ref 3.5–5.0)
Alkaline Phosphatase: 137 U/L (ref 40–150)
BUN: 13.6 mg/dL (ref 7.0–26.0)
CO2: 24 meq/L (ref 22–29)
Calcium: 9.9 mg/dL (ref 8.4–10.4)
Chloride: 106 mEq/L (ref 98–109)
Creatinine: 0.9 mg/dL (ref 0.6–1.1)
Glucose: 84 mg/dl (ref 70–140)
Potassium: 4.3 mEq/L (ref 3.5–5.1)
SODIUM: 141 meq/L (ref 136–145)
TOTAL PROTEIN: 6.4 g/dL (ref 6.4–8.3)
Total Bilirubin: 0.44 mg/dL (ref 0.20–1.20)

## 2014-09-11 LAB — CBC WITH DIFFERENTIAL/PLATELET
BASO%: 0.9 % (ref 0.0–2.0)
Basophils Absolute: 0.1 10*3/uL (ref 0.0–0.1)
EOS%: 3.5 % (ref 0.0–7.0)
Eosinophils Absolute: 0.2 10*3/uL (ref 0.0–0.5)
HCT: 40.7 % (ref 34.8–46.6)
HGB: 13.6 g/dL (ref 11.6–15.9)
LYMPH%: 14 % (ref 14.0–49.7)
MCH: 32.5 pg (ref 25.1–34.0)
MCHC: 33.3 g/dL (ref 31.5–36.0)
MCV: 97.5 fL (ref 79.5–101.0)
MONO#: 0.7 10*3/uL (ref 0.1–0.9)
MONO%: 10.9 % (ref 0.0–14.0)
NEUT#: 4.6 10*3/uL (ref 1.5–6.5)
NEUT%: 70.7 % (ref 38.4–76.8)
Platelets: 286 10*3/uL (ref 145–400)
RBC: 4.18 10*6/uL (ref 3.70–5.45)
RDW: 12.9 % (ref 11.2–14.5)
WBC: 6.5 10*3/uL (ref 3.9–10.3)
lymph#: 0.9 10*3/uL (ref 0.9–3.3)

## 2014-09-11 LAB — LACTATE DEHYDROGENASE (CC13): LDH: 241 U/L (ref 125–245)

## 2014-09-11 MED ORDER — DIPHENHYDRAMINE HCL 25 MG PO CAPS
25.0000 mg | ORAL_CAPSULE | Freq: Once | ORAL | Status: AC
  Administered 2014-09-11: 25 mg via ORAL

## 2014-09-11 MED ORDER — ACETAMINOPHEN 325 MG PO TABS
ORAL_TABLET | ORAL | Status: AC
  Filled 2014-09-11: qty 2

## 2014-09-11 MED ORDER — ACETAMINOPHEN 325 MG PO TABS
650.0000 mg | ORAL_TABLET | Freq: Once | ORAL | Status: AC
  Administered 2014-09-11: 650 mg via ORAL

## 2014-09-11 MED ORDER — SODIUM CHLORIDE 0.9 % IV SOLN
375.0000 mg/m2 | Freq: Once | INTRAVENOUS | Status: AC
  Administered 2014-09-11: 600 mg via INTRAVENOUS
  Filled 2014-09-11: qty 60

## 2014-09-11 MED ORDER — DIPHENHYDRAMINE HCL 25 MG PO CAPS
ORAL_CAPSULE | ORAL | Status: AC
  Filled 2014-09-11: qty 1

## 2014-09-11 MED ORDER — SODIUM CHLORIDE 0.9 % IV SOLN
Freq: Once | INTRAVENOUS | Status: AC
  Administered 2014-09-11: 10:00:00 via INTRAVENOUS

## 2014-09-11 NOTE — Assessment & Plan Note (Signed)
I spent some time counseling the patient the importance of tobacco cessation. She is currently not interested to quit now. 

## 2014-09-11 NOTE — Assessment & Plan Note (Signed)
I am concerned about recurrence of disease. Her last CT scan from June 2014 indicated new axillary lymphadenopathy. She has elevated LDH. I recommend repeat staging CT scan to exclude progression of disease and she agreed. The patient is adamant that she proceeds with maintenance rituximab indefinitely. There is no data for this. I will proceed to give her treatment today and plan to see her back in 2 weeks to review results of imaging study.

## 2014-09-11 NOTE — Telephone Encounter (Signed)
Pt confirmed labs/ov per 12/02 POF, gave pt AVS..... KJ °

## 2014-09-11 NOTE — Progress Notes (Signed)
[facility] FOLLOW-UP progress notes  Patient Care Team: Lisabeth Pick, MD as PCP - General  CHIEF COMPLAINTS/PURPOSE OF VISIT:  Recurrent lymphoma, on maintenance rituximab  HISTORY OF PRESENTING ILLNESS:  Brooke Washington 73 y.o. female was transferred to my care after her prior physician has left.  I reviewed the patient's records extensive and collaborated the history with the patient. Summary of her history is as follows: Oncology History   --Early 2000.  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 She was treated with chlorambucil in 2000.   --2007 - 09/2008.  Treated with single agent Rituxan.   --12/2008.  She had progressive adenopathy in chest and abdomen and repeat biopsy showed low grade follicular and diffuse B cell NHL.   --01/2009 - 07/2009. She required left ureteral stent and was treated with Treanda/Rituxan x 5 cycles from 02-2009 thru 06-2009.   --06/2009.  On maintenance Rituxan every 3 months without difficulty.  --04/02/2013.    CT CAP demonstrated  stable disease No evidence of lymphoma recurrence within the abdomen or pelvis but new mild right axillary lymphadenopathy without mediastinal adenopathy    She feels well. She denies new lymphadenopathy. She wants to be maintained on treatment indefinitely because she feared without maintenance treatment, her cancer will return.  MEDICAL HISTORY:  Past Medical History  Diagnosis Date  . Cancer  APRIL OF 2000    LOW-GRADE NON-HODGKIN'S LYMPHOMA    SURGICAL HISTORY: Past Surgical History  Procedure Laterality Date  . Right oophorectomy  1976  . 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 Marital Status: Married    Spouse Name: N/A    Number of Children: N/A  . Years of Education:  N/A   Occupational History  . Not on file.   Social History Main Topics  . Smoking status: Current Every Day Smoker -- 0.05 packs/day for 51 years  . Smokeless tobacco: Never Used     Comment: 4-5 cigarettes/ day   . Alcohol Use: Yes     Comment: rare  . Drug Use: No  . Sexual Activity: Not on file   Other Topics Concern  . Not on file   Social History Narrative    FAMILY HISTORY: Family History  Problem Relation Age of Onset  . Cancer Mother     colon ca    ALLERGIES:  is allergic to contrast media; iohexol; povidone-iodine; and penicillins.  MEDICATIONS:  Current Outpatient Prescriptions  Medication Sig Dispense Refill  . Cholecalciferol 2000 UNITS TABS Take 1 tablet by mouth daily.      . citalopram (CELEXA) 40 MG tablet Take 1 tablet (40 mg total) by mouth daily. 90 tablet 0  . furosemide (LASIX) 20 MG tablet Take one tablet by mouth every 3 days as needed for swelling. 90 tablet 0   No current facility-administered medications for this visit.    REVIEW OF SYSTEMS:   Constitutional: Denies fevers, chills or abnormal night sweats Eyes: Denies blurriness of vision, double vision or watery eyes Ears, nose, mouth, throat, and face: Denies mucositis or sore throat Respiratory: Denies cough, dyspnea or wheezes Cardiovascular: Denies palpitation, chest discomfort or lower extremity swelling Gastrointestinal:  Denies nausea, heartburn or change in bowel habits Skin: Denies abnormal skin rashes Lymphatics: Denies new lymphadenopathy or easy bruising  Neurological:Denies numbness, tingling or new weaknesses Behavioral/Psych: Mood is stable, no new changes  All other systems were reviewed with the patient and are negative.  PHYSICAL EXAMINATION: ECOG PERFORMANCE STATUS: 0 - Asymptomatic  Filed Vitals:   09/11/14 0825  BP: 138/62  Pulse: 68  Temp: 97.6 F (36.4 C)  Resp: 18   Filed Weights   09/11/14 0825  Weight: 130 lb 12.8 oz (59.33 kg)    GENERAL:alert,  no distress and comfortable SKIN: skin color, texture, turgor are normal, no rashes or significant lesions EYES: normal, conjunctiva are pink and non-injected, sclera clear OROPHARYNX:no exudate, normal lips, buccal mucosa, and tongue  NECK: supple, thyroid normal size, non-tender, without nodularity LYMPH:  no palpable lymphadenopathy in the cervical, axillary or inguinal LUNGS: clear to auscultation and percussion with normal breathing effort HEART: regular rate & rhythm and no murmurs without lower extremity edema ABDOMEN:abdomen soft, non-tender and normal bowel sounds Musculoskeletal:no cyanosis of digits and no clubbing  PSYCH: alert & oriented x 3 with fluent speech NEURO: no focal motor/sensory deficits  LABORATORY DATA:  I have reviewed the data as listed Lab Results  Component Value Date   WBC 6.5 09/11/2014   HGB 13.6 09/11/2014   HCT 40.7 09/11/2014   MCV 97.5 09/11/2014   PLT 286 09/11/2014    Recent Labs  03/05/14 0853 06/11/14 0802 09/11/14 0805  NA 142 141 141  K 4.2 3.6 4.3  CO2 25 25 24   GLUCOSE 87 84 84  BUN 10.3 15.1 13.6  CREATININE 0.7 0.8 0.9  CALCIUM 9.7 9.6 9.9  PROT 6.3* 6.7 6.4  ALBUMIN 3.8 3.9 3.9  AST 16 15 16   ALT 11 12 9   ALKPHOS 104 103 137  BILITOT 0.41 0.37 0.44    ASSESSMENT & PLAN:  NHL (non-Hodgkin's lymphoma) I am concerned about recurrence of disease. Her last CT scan from June 2014 indicated new axillary lymphadenopathy. She has elevated LDH. I recommend repeat staging CT scan to exclude progression of disease and she agreed. The patient is adamant that she proceeds with maintenance rituximab indefinitely. There is no data for this. I will proceed to give her treatment today and plan to see her back in 2 weeks to review results of imaging study.  Tobacco abuse I spent some time counseling the patient the importance of tobacco cessation. She is currently not interested to quit now.    Orders Placed This Encounter   Procedures  . CT Chest Wo Contrast    Standing Status: Future     Number of Occurrences:      Standing Expiration Date: 11/11/2015    Order Specific Question:  Reason for Exam (SYMPTOM  OR DIAGNOSIS REQUIRED)    Answer:  staging lymphoma    Order Specific Question:  Preferred imaging location?    Answer:  [facility]  . CT Abdomen Pelvis Wo Contrast    Standing Status: Future     Number of Occurrences:      Standing Expiration Date: 12/12/2015    Order Specific Question:  Reason for Exam (SYMPTOM  OR DIAGNOSIS REQUIRED)    Answer:  staging lymphoma    Order Specific Question:  Preferred imaging location?    Answer:  [facility]    All questions were answered. The patient knows to call the clinic with any problems, questions or concerns. I spent 30 minutes counseling the patient face to face. The total time spent in the appointment was 40 minutes and more than 50%  was on counseling.     [facility], [place], MD 09/11/2014 7:55 PM

## 2014-09-11 NOTE — Patient Instructions (Signed)
[facility] Discharge Instructions for Patients Receiving Chemotherapy  Today you received the following chemotherapy agents Rituxan  To help prevent nausea and vomiting after your treatment, we encourage you to take your nausea medication if needed.   If you develop nausea and vomiting that is not controlled by your nausea medication, call the clinic.   BELOW ARE SYMPTOMS THAT SHOULD BE REPORTED IMMEDIATELY:  *FEVER GREATER THAN 100.5 F  *CHILLS WITH OR WITHOUT FEVER  NAUSEA AND VOMITING THAT IS NOT CONTROLLED WITH YOUR NAUSEA MEDICATION  *UNUSUAL SHORTNESS OF BREATH  *UNUSUAL BRUISING OR BLEEDING  TENDERNESS IN MOUTH AND THROAT WITH OR WITHOUT PRESENCE OF ULCERS  *URINARY PROBLEMS  *BOWEL PROBLEMS  UNUSUAL RASH Items with * indicate a potential emergency and should be followed up as soon as possible.  Feel free to call the clinic you have any questions or concerns. The clinic phone number is [phone].

## 2014-09-16 ENCOUNTER — Encounter (HOSPITAL_COMMUNITY): Payer: Self-pay

## 2014-09-16 ENCOUNTER — Ambulatory Visit (HOSPITAL_COMMUNITY)
Admission: RE | Admit: 2014-09-16 | Discharge: 2014-09-16 | Disposition: A | Discharge status: Home / Self Care | Payer: Medicare Other | Source: Ambulatory Visit | Attending: Hematology and Oncology | Admitting: Hematology and Oncology

## 2014-09-16 DIAGNOSIS — J984 Other disorders of lung: Secondary | ICD-10-CM | POA: Insufficient documentation

## 2014-09-16 DIAGNOSIS — C8590 Non-Hodgkin lymphoma, unspecified, unspecified site: Secondary | ICD-10-CM | POA: Insufficient documentation

## 2014-09-16 DIAGNOSIS — I2510 Atherosclerotic heart disease of native coronary artery without angina pectoris: Secondary | ICD-10-CM | POA: Insufficient documentation

## 2014-09-16 DIAGNOSIS — K449 Diaphragmatic hernia without obstruction or gangrene: Secondary | ICD-10-CM | POA: Insufficient documentation

## 2014-09-16 DIAGNOSIS — J432 Centrilobular emphysema: Secondary | ICD-10-CM | POA: Insufficient documentation

## 2014-09-16 DIAGNOSIS — I700 Atherosclerosis of aorta: Secondary | ICD-10-CM | POA: Insufficient documentation

## 2014-09-16 DIAGNOSIS — R599 Enlarged lymph nodes, unspecified: Secondary | ICD-10-CM | POA: Diagnosis not present

## 2014-09-19 ENCOUNTER — Ambulatory Visit (HOSPITAL_BASED_OUTPATIENT_CLINIC_OR_DEPARTMENT_OTHER): Payer: Medicare Other | Admitting: Hematology and Oncology

## 2014-09-19 ENCOUNTER — Encounter: Payer: Self-pay | Admitting: Hematology and Oncology

## 2014-09-19 ENCOUNTER — Telehealth: Payer: Self-pay | Admitting: Hematology and Oncology

## 2014-09-19 VITALS — BP 119/65 | HR 99 | Temp 97.4°F | Resp 18 | Ht 64.0 in | Wt 130.8 lb

## 2014-09-19 DIAGNOSIS — R609 Edema, unspecified: Secondary | ICD-10-CM

## 2014-09-19 DIAGNOSIS — C8590 Non-Hodgkin lymphoma, unspecified, unspecified site: Secondary | ICD-10-CM

## 2014-09-19 MED ORDER — FUROSEMIDE 20 MG PO TABS
ORAL_TABLET | ORAL | Status: DC
Start: 2014-09-19 — End: 2015-02-07

## 2014-09-19 NOTE — Assessment & Plan Note (Signed)
Currently, she has no signs of disease. The patient is a long-term cancer survivor and currently is 5 years out from last recurrence. There is no data beyond 2 years to use maintenance rituximab. 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 After extensive discussion, she is in agreement not to pursue further treatment. I will see her back in 3 months with repeat history, physical examination and blood work.

## 2014-09-19 NOTE — Telephone Encounter (Signed)
gv adn pritneda ppt sched adn avs for pt for March 2016

## 2014-09-19 NOTE — Progress Notes (Signed)
[facility] OFFICE PROGRESS NOTE  Patient Care Team: Lisabeth Pick, MD as PCP - General  SUMMARY OF ONCOLOGIC HISTORY: Oncology History   --Early 2000.  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 She was treated with chlorambucil in 2000.   --2007 - 09/2008.  Treated with single agent Rituxan.   --12/2008.  She had progressive adenopathy in chest and abdomen and repeat biopsy showed low grade follicular and diffuse B cell NHL.   --01/2009 - 07/2009. She required left ureteral stent and was treated with Treanda/Rituxan x 5 cycles from 02-2009 thru 06-2009.   --06/2009.  On maintenance Rituxan every 3 months without difficulty.  --04/02/2013.    CT CAP demonstrated  stable disease No evidence of lymphoma recurrence within the abdomen or pelvis but new mild right axillary lymphadenopathy without mediastinal adenopathy     INTERVAL HISTORY: Please see below for problem oriented charting. She feels well. She returns to review test results  REVIEW OF SYSTEMS:   All other systems were reviewed with the patient and are negative.  I have reviewed the past medical history, past surgical history, social history and family history with the patient and they are unchanged from previous note.  ALLERGIES:  is allergic to contrast media; iohexol; povidone-iodine; and penicillins.  MEDICATIONS:  Current Outpatient Prescriptions  Medication Sig Dispense Refill  . Cholecalciferol 2000 UNITS TABS Take 1 tablet by mouth daily.      . citalopram (CELEXA) 40 MG tablet Take 1 tablet (40 mg total) by mouth daily. 90 tablet 0  . furosemide (LASIX) 20 MG tablet Take one tablet by mouth every 3 days as needed for swelling. 90 tablet 0   No current facility-administered medications for this visit.    PHYSICAL EXAMINATION: ECOG PERFORMANCE STATUS: 0 - Asymptomatic  Filed Vitals:   09/19/14  1143  BP: 119/65  Pulse: 99  Temp: 97.4 F (36.3 C)  Resp: 18   Filed Weights   09/19/14 1143  Weight: 130 lb 12.8 oz (59.33 kg)    GENERAL:alert, no distress and comfortable SKIN: skin color, texture, turgor are normal, no rashes or significant lesions EYES: normal, Conjunctiva are pink and non-injected, sclera clear Musculoskeletal:no cyanosis of digits and no clubbing  NEURO: alert & oriented x 3 with fluent speech, no focal motor/sensory deficits  LABORATORY DATA:  I have reviewed the data as listed    Component Value Date/Time   NA 141 09/11/2014 0805   NA 142 03/17/2012 0929   K 4.3 09/11/2014 0805   K 3.6 03/17/2012 0929   CL 101 03/28/2013 0826   CL 104 03/17/2012 0929   CO2 24 09/11/2014 0805   CO2 29 03/17/2012 0929   GLUCOSE 84 09/11/2014 0805   GLUCOSE 84 03/28/2013 0826   GLUCOSE 84 03/17/2012 0929   BUN 13.6 09/11/2014 0805   BUN 11 03/17/2012 0929   CREATININE 0.9 09/11/2014 0805   CREATININE 0.76 03/17/2012 0929   CALCIUM 9.9 09/11/2014 0805   CALCIUM 9.6 03/17/2012 0929   PROT 6.4 09/11/2014 0805   PROT 6.2 03/17/2012 0929   ALBUMIN 3.9 09/11/2014 0805   ALBUMIN 4.4 03/17/2012 0929   AST 16 09/11/2014 0805   AST 18 03/17/2012 0929   ALT 9 09/11/2014 0805   ALT 9 03/17/2012 0929   ALKPHOS 137 09/11/2014 0805   ALKPHOS 123* 03/17/2012 0929   BILITOT 0.44 09/11/2014 0805   BILITOT 0.5 03/17/2012  5726   GFRNONAA 80.06 05/22/2010 1106   GFRAA  03/27/2009 1035    >60        The eGFR has been calculated using the MDRD equation. This calculation has not been validated in all clinical situations. eGFR's persistently <60 mL/min signify possible Chronic Kidney Disease.    No results found for: SPEP, UPEP  Lab Results  Component Value Date   WBC 6.5 09/11/2014   NEUTROABS 4.6 09/11/2014   HGB 13.6 09/11/2014   HCT 40.7 09/11/2014   MCV 97.5 09/11/2014   PLT 286 09/11/2014      Chemistry      Component Value Date/Time   NA 141  09/11/2014 0805   NA 142 03/17/2012 0929   K 4.3 09/11/2014 0805   K 3.6 03/17/2012 0929   CL 101 03/28/2013 0826   CL 104 03/17/2012 0929   CO2 24 09/11/2014 0805   CO2 29 03/17/2012 0929   BUN 13.6 09/11/2014 0805   BUN 11 03/17/2012 0929   CREATININE 0.9 09/11/2014 0805   CREATININE 0.76 03/17/2012 0929      Component Value Date/Time   CALCIUM 9.9 09/11/2014 0805   CALCIUM 9.6 03/17/2012 0929   ALKPHOS 137 09/11/2014 0805   ALKPHOS 123* 03/17/2012 0929   AST 16 09/11/2014 0805   AST 18 03/17/2012 0929   ALT 9 09/11/2014 0805   ALT 9 03/17/2012 0929   BILITOT 0.44 09/11/2014 0805   BILITOT 0.5 03/17/2012 0929       RADIOGRAPHIC STUDIES: I reviewed the imaging study with her and her husband which show no evidence of disease I have personally reviewed the radiological images as listed and agreed with the findings in the report.   ASSESSMENT & PLAN:  NHL (non-Hodgkin's lymphoma) Currently, she has no signs of disease. The patient is a long-term cancer survivor and currently is 5 years out from last recurrence. There is no data beyond 2 years to use maintenance rituximab. 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 After extensive discussion, she is in agreement not to pursue further treatment. I will see her back in 3 months with repeat history, physical examination and blood work.   Orders Placed This Encounter  Procedures  . CBC with Differential    Standing Status: Future     Number of Occurrences:      Standing Expiration Date: 10/24/2015  . Comprehensive metabolic panel    Standing Status: Future     Number of Occurrences:      Standing Expiration Date: 10/24/2015  . Lactate dehydrogenase    Standing Status: Future     Number of Occurrences:      Standing Expiration Date: 10/24/2015   All questions were answered. The patient knows to call the clinic with any  problems, questions or concerns. No barriers to learning was detected. I spent 30 minutes counseling the patient face to face. The total time spent in the appointment was 40 minutes and more than 50% was on counseling and review of test results     [facility], [place], MD 09/19/2014 8:52 PM

## 2014-09-26 ENCOUNTER — Other Ambulatory Visit: Payer: Self-pay | Admitting: Nurse Practitioner

## 2014-12-05 DIAGNOSIS — Z961 Presence of intraocular lens: Secondary | ICD-10-CM | POA: Diagnosis not present

## 2014-12-19 ENCOUNTER — Encounter: Payer: Self-pay | Admitting: Hematology and Oncology

## 2014-12-19 ENCOUNTER — Other Ambulatory Visit (HOSPITAL_BASED_OUTPATIENT_CLINIC_OR_DEPARTMENT_OTHER): Payer: Medicare Other

## 2014-12-19 ENCOUNTER — Ambulatory Visit (HOSPITAL_BASED_OUTPATIENT_CLINIC_OR_DEPARTMENT_OTHER): Payer: Medicare Other | Admitting: Hematology and Oncology

## 2014-12-19 ENCOUNTER — Telehealth: Payer: Self-pay | Admitting: Hematology and Oncology

## 2014-12-19 VITALS — BP 124/66 | HR 70 | Temp 97.4°F | Resp 18 | Ht 64.0 in | Wt 132.2 lb

## 2014-12-19 DIAGNOSIS — Z720 Tobacco use: Secondary | ICD-10-CM | POA: Diagnosis not present

## 2014-12-19 DIAGNOSIS — C8590 Non-Hodgkin lymphoma, unspecified, unspecified site: Secondary | ICD-10-CM | POA: Diagnosis not present

## 2014-12-19 LAB — COMPREHENSIVE METABOLIC PANEL (CC13)
ALBUMIN: 3.9 g/dL (ref 3.5–5.0)
ALK PHOS: 134 U/L (ref 40–150)
ALT: 8 U/L (ref 0–55)
ANION GAP: 10 meq/L (ref 3–11)
AST: 14 U/L (ref 5–34)
BILIRUBIN TOTAL: 0.42 mg/dL (ref 0.20–1.20)
BUN: 14.9 mg/dL (ref 7.0–26.0)
CHLORIDE: 104 meq/L (ref 98–109)
CO2: 27 meq/L (ref 22–29)
Calcium: 9.3 mg/dL (ref 8.4–10.4)
Creatinine: 0.9 mg/dL (ref 0.6–1.1)
EGFR: 62 mL/min/{1.73_m2} — AB (ref >90)
GLUCOSE: 73 mg/dL (ref 70–140)
Potassium: 3.7 mEq/L (ref 3.5–5.1)
SODIUM: 141 meq/L (ref 136–145)
TOTAL PROTEIN: 6.4 g/dL (ref 6.4–8.3)

## 2014-12-19 LAB — CBC WITH DIFFERENTIAL/PLATELET
BASO%: 1.1 % (ref 0.0–2.0)
BASOS ABS: 0.1 10*3/uL (ref 0.0–0.1)
EOS ABS: 0.2 10*3/uL (ref 0.0–0.5)
EOS%: 4.2 % (ref 0.0–7.0)
HCT: 41.7 % (ref 34.8–46.6)
HGB: 14.3 g/dL (ref 11.6–15.9)
LYMPH%: 16.6 % (ref 14.0–49.7)
MCH: 33.4 pg (ref 25.1–34.0)
MCHC: 34.3 g/dL (ref 31.5–36.0)
MCV: 97.4 fL (ref 79.5–101.0)
MONO#: 0.7 10*3/uL (ref 0.1–0.9)
MONO%: 12.4 % (ref 0.0–14.0)
NEUT#: 3.7 10*3/uL (ref 1.5–6.5)
NEUT%: 65.7 % (ref 38.4–76.8)
PLATELETS: 269 10*3/uL (ref 145–400)
RBC: 4.28 10*6/uL (ref 3.70–5.45)
RDW: 12.7 % (ref 11.2–14.5)
WBC: 5.7 10*3/uL (ref 3.9–10.3)
lymph#: 0.9 10*3/uL (ref 0.9–3.3)

## 2014-12-19 LAB — LACTATE DEHYDROGENASE (CC13): LDH: 235 U/L (ref 125–245)

## 2014-12-19 NOTE — Progress Notes (Signed)
[facility] OFFICE PROGRESS NOTE  Patient Care Team: Lisabeth Pick, MD as PCP - General  SUMMARY OF ONCOLOGIC HISTORY: Oncology History   --Early 2000.  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 She was treated with chlorambucil in 2000.   --2007 - 09/2008.  Treated with single agent Rituxan.   --12/2008.  She had progressive adenopathy in chest and abdomen and repeat biopsy showed low grade follicular and diffuse B cell NHL.   --01/2009 - 07/2009. She required left ureteral stent and was treated with Treanda/Rituxan x 5 cycles from 02-2009 thru 06-2009.   --06/2009.  On maintenance Rituxan every 3 months without difficulty.  --04/02/2013.    CT CAP demonstrated  stable disease No evidence of lymphoma recurrence within the abdomen or pelvis but new mild right axillary lymphadenopathy without mediastinal adenopathy     INTERVAL HISTORY: Please see below for problem oriented charting. Her treatment was discontinued 3 months ago. She feels fine. Denies new lymphadenopathy. She continues to smoke.  REVIEW OF SYSTEMS:   Constitutional: Denies fevers, chills or abnormal weight loss Eyes: Denies blurriness of vision Ears, nose, mouth, throat, and face: Denies mucositis or sore throat Respiratory: Denies cough, dyspnea or wheezes Cardiovascular: Denies palpitation, chest discomfort or lower extremity swelling Gastrointestinal:  Denies nausea, heartburn or change in bowel habits Skin: Denies abnormal skin rashes Lymphatics: Denies new lymphadenopathy or easy bruising Neurological:Denies numbness, tingling or new weaknesses Behavioral/Psych: Mood is stable, no new changes  All other systems were reviewed with the patient and are negative.  I have reviewed the past medical history, past surgical history, social history and family history with the patient and they are unchanged  from previous note.  ALLERGIES:  is allergic to contrast media; iohexol; povidone-iodine; and penicillins.  MEDICATIONS:  Current Outpatient Prescriptions  Medication Sig Dispense Refill  . Cholecalciferol 2000 UNITS TABS Take 1 tablet by mouth daily.      . citalopram (CELEXA) 40 MG tablet Take 1 tablet (40 mg total) by mouth daily. 90 tablet 0  . furosemide (LASIX) 20 MG tablet Take one tablet by mouth every 3 days as needed for swelling. 90 tablet 0   No current facility-administered medications for this visit.    PHYSICAL EXAMINATION: ECOG PERFORMANCE STATUS: 0 - Asymptomatic  Filed Vitals:   12/19/14 1022  BP: 124/66  Pulse: 70  Temp: 97.4 F (36.3 C)  Resp: 18   Filed Weights   12/19/14 1022  Weight: 132 lb 3.2 oz (59.966 kg)    GENERAL:alert, no distress and comfortable SKIN: skin color, texture, turgor are normal, no rashes or significant lesions EYES: normal, Conjunctiva are pink and non-injected, sclera clear OROPHARYNX:no exudate, no erythema and lips, buccal mucosa, and tongue normal  NECK: supple, thyroid normal size, non-tender, without nodularity LYMPH:  no palpable lymphadenopathy in the cervical, axillary or inguinal LUNGS: clear to auscultation and percussion with normal breathing effort HEART: regular rate & rhythm and no murmurs and no lower extremity edema ABDOMEN:abdomen soft, non-tender and normal bowel sounds Musculoskeletal:no cyanosis of digits and no clubbing  NEURO: alert & oriented x 3 with fluent speech, no focal motor/sensory deficits  LABORATORY DATA:  I have reviewed the data as listed    Component Value Date/Time   NA 141 12/19/2014 1008   NA 142 03/17/2012 0929   K 3.7 12/19/2014 1008   K 3.6 03/17/2012 0929   CL 101 03/28/2013  0826   CL 104 03/17/2012 0929   CO2 27 12/19/2014 1008   CO2 29 03/17/2012 0929   GLUCOSE 73 12/19/2014 1008   GLUCOSE 84 03/28/2013 0826   GLUCOSE 84 03/17/2012 0929   BUN 14.9 12/19/2014 1008   BUN  11 03/17/2012 0929   CREATININE 0.9 12/19/2014 1008   CREATININE 0.76 03/17/2012 0929   CALCIUM 9.3 12/19/2014 1008   CALCIUM 9.6 03/17/2012 0929   PROT 6.4 12/19/2014 1008   PROT 6.2 03/17/2012 0929   ALBUMIN 3.9 12/19/2014 1008   ALBUMIN 4.4 03/17/2012 0929   AST 14 12/19/2014 1008   AST 18 03/17/2012 0929   ALT 8 12/19/2014 1008   ALT 9 03/17/2012 0929   ALKPHOS 134 12/19/2014 1008   ALKPHOS 123* 03/17/2012 0929   BILITOT 0.42 12/19/2014 1008   BILITOT 0.5 03/17/2012 0929   GFRNONAA 80.06 05/22/2010 1106   GFRAA  03/27/2009 1035    >60        The eGFR has been calculated using the MDRD equation. This calculation has not been validated in all clinical situations. eGFR's persistently <60 mL/min signify possible Chronic Kidney Disease.    No results found for: SPEP, UPEP  Lab Results  Component Value Date   WBC 5.7 12/19/2014   NEUTROABS 3.7 12/19/2014   HGB 14.3 12/19/2014   HCT 41.7 12/19/2014   MCV 97.4 12/19/2014   PLT 269 12/19/2014      Chemistry      Component Value Date/Time   NA 141 12/19/2014 1008   NA 142 03/17/2012 0929   K 3.7 12/19/2014 1008   K 3.6 03/17/2012 0929   CL 101 03/28/2013 0826   CL 104 03/17/2012 0929   CO2 27 12/19/2014 1008   CO2 29 03/17/2012 0929   BUN 14.9 12/19/2014 1008   BUN 11 03/17/2012 0929   CREATININE 0.9 12/19/2014 1008   CREATININE 0.76 03/17/2012 0929      Component Value Date/Time   CALCIUM 9.3 12/19/2014 1008   CALCIUM 9.6 03/17/2012 0929   ALKPHOS 134 12/19/2014 1008   ALKPHOS 123* 03/17/2012 0929   AST 14 12/19/2014 1008   AST 18 03/17/2012 0929   ALT 8 12/19/2014 1008   ALT 9 03/17/2012 0929   BILITOT 0.42 12/19/2014 1008   BILITOT 0.5 03/17/2012 0929      ASSESSMENT & PLAN:  NHL (non-Hodgkin's lymphoma) Currently, she has no signs of disease. The patient is a long-term cancer survivor and currently is 5 years out from last recurrence. There is no data beyond 2 years to use maintenance  rituximab. I will see her back in 3 months with repeat history, physical examination and blood work.     Tobacco abuse I spent some time counseling the patient the importance of tobacco cessation. She is currently not interested to quit now. I recommend aspirin therapy to prevent risk of heart disease.      Orders Placed This Encounter  Procedures  . Comprehensive metabolic panel    Standing Status: Future     Number of Occurrences:      Standing Expiration Date: 01/23/2016  . CBC with Differential/Platelet    Standing Status: Future     Number of Occurrences:      Standing Expiration Date: 01/23/2016  . Lactate dehydrogenase    Standing Status: Future     Number of Occurrences:      Standing Expiration Date: 01/23/2016   All questions were answered. The patient knows to call the clinic  with any problems, questions or concerns. No barriers to learning was detected. I spent 15 minutes counseling the patient face to face. The total time spent in the appointment was 20 minutes and more than 50% was on counseling and review of test results     [facility], [place], MD 12/19/2014 10:53 AM

## 2014-12-19 NOTE — Telephone Encounter (Signed)
Pt confirmed labs/ov per 03/10 POF, gave pt AVS..... KJ °

## 2014-12-19 NOTE — Assessment & Plan Note (Signed)
Currently, she has no signs of disease. The patient is a long-term cancer survivor and currently is 5 years out from last recurrence. There is no data beyond 2 years to use maintenance rituximab. I will see her back in 3 months with repeat history, physical examination and blood work.

## 2014-12-19 NOTE — Assessment & Plan Note (Signed)
I spent some time counseling the patient the importance of tobacco cessation. She is currently not interested to quit now. I recommend aspirin therapy to prevent risk of heart disease.

## 2015-02-05 ENCOUNTER — Other Ambulatory Visit: Payer: Self-pay | Admitting: Internal Medicine

## 2015-02-07 ENCOUNTER — Encounter: Payer: Self-pay | Admitting: Adult Health

## 2015-02-07 ENCOUNTER — Ambulatory Visit (INDEPENDENT_AMBULATORY_CARE_PROVIDER_SITE_OTHER): Payer: Medicare Other | Admitting: Adult Health

## 2015-02-07 VITALS — BP 108/78 | Temp 98.6°F | Ht 63.0 in | Wt 132.0 lb

## 2015-02-07 DIAGNOSIS — R609 Edema, unspecified: Secondary | ICD-10-CM | POA: Diagnosis not present

## 2015-02-07 DIAGNOSIS — M8580 Other specified disorders of bone density and structure, unspecified site: Secondary | ICD-10-CM

## 2015-02-07 DIAGNOSIS — Z7189 Other specified counseling: Secondary | ICD-10-CM

## 2015-02-07 DIAGNOSIS — Z23 Encounter for immunization: Secondary | ICD-10-CM

## 2015-02-07 DIAGNOSIS — Z7689 Persons encountering health services in other specified circumstances: Secondary | ICD-10-CM

## 2015-02-07 MED ORDER — FUROSEMIDE 20 MG PO TABS: ORAL_TABLET | ORAL | Status: DC

## 2015-02-07 MED ORDER — CITALOPRAM HYDROBROMIDE 40 MG PO TABS: 40.0000 mg | ORAL_TABLET | Freq: Every day | ORAL | Status: DC

## 2015-02-07 NOTE — Progress Notes (Signed)
HPI:  Brooke Washington is here to establish care.  Last PCP and physical: Multiple physicals a year from oncology GYN: Years Dentist: Once a year  Eye: Yearly  Very pleasant 75 year old white female who continues to smoke 4 cigarettes a day. She has been cancer free for approx six months. Is following up with her oncologist in June.   She has no complaints today and states " I feel great"  Has the following chronic problems that require follow up and concerns today:     ROS negative for unless reported above: fevers, unintentional weight loss, hearing or vision loss, chest pain, palpitations, struggling to breath, hemoptysis, melena, hematochezia, hematuria, falls, loc, si, thoughts of self harm  Past Medical History  Diagnosis Date  . Cancer  APRIL OF 2000    LOW-GRADE NON-HODGKIN'S LYMPHOMA    Past Surgical History  Procedure Laterality Date  . Right oophorectomy  1976  . 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 Cancer Mother     colon ca    History   Social History  . Marital Status: Married    Spouse Name: N/A  . Number of Children: N/A  . Years of Education: N/A   Social History Main Topics  . Smoking status: Current Every Day Smoker -- 0.05 packs/day for 51 years    Types: Cigarettes  . Smokeless tobacco: Never Used     Comment: 4-5 cigarettes/ day   . Alcohol Use: 0.0 oz/week    0 Standard drinks or equivalent per week     Comment: rare  . Drug Use: No  . Sexual Activity: Not on file   Other Topics Concern  . None   Social History Narrative     Current outpatient prescriptions:  .  aspirin 81 MG tablet, Take 81 mg by mouth daily., Disp: , Rfl:  .  Cholecalciferol 2000 UNITS TABS, Take 1 tablet by mouth daily.  , Disp: , Rfl:  .  citalopram (CELEXA) 40 MG tablet, Take 1 tablet (40 mg total) by mouth daily., Disp: 90  tablet, Rfl: 0 .  furosemide (LASIX) 20 MG tablet, Take one tablet by mouth every 3 days as needed for swelling., Disp: 90 tablet, Rfl: 0  EXAM:  Filed Vitals:   02/07/15 0955  BP: 108/78  Temp: 98.6 F (37 C)    Body mass index is 23.39 kg/(m^2).  GENERAL: vitals reviewed and listed above, alert, oriented, appears well hydrated and in no acute distress  HEENT: atraumatic, conjunttiva clear, no obvious abnormalities on inspection of external nose and ears. She wears glasses and has cataract surgery in 2014  NECK: no obvious masses on inspection  LUNGS: clear to auscultation bilaterally, no wheezes, rales or rhonchi, good air movement  CV: HRRR, no peripheral edema  MS: moves all extremities without noticeable abnormality  PSYCH: pleasant and cooperative, no obvious depression or anxiety  ASSESSMENT AND PLAN:  Discussed the following assessment and plan:  1. Edema - furosemide (LASIX) 20 MG tablet; Take one tablet by mouth every 3 days as needed for swelling.  Dispense: 90 tablet; Refill: 3  2. Encounter to establish care - Spoke to patient about Shingles vaccination. She does not want it at this time.  - She would like to wean off Celexa. We came up with a plan to do this and she  was educated on the symptoms of withdraw. If she has any episodes of depression then she needs to follow up with me or go to the ER.  - Follow up with me in 2-3 months for CPE - Follow up sooner if needed.   3. Need for pneumococcal vaccination - Pneumococcal conjugate vaccine 13-valent IM  4. Osteopenia - DG Bone Density; Future    We reviewed the PMH, PSH, FH, SH, Meds and Allergies. -We provided refills for any medications we will prescribe as needed. -We addressed current concerns per orders and patient instructions. -We have asked for records for pertinent exams, studies, vaccines and notes from previous providers. -We have advised patient to follow up per instructions  below.   -Patient advised to return or notify a provider immediately if symptoms worsen or persist or new concerns arise.  There are no Patient Instructions on file for this visit.   [employer]

## 2015-02-07 NOTE — Patient Instructions (Addendum)
It was great meeting you today! Continue with your healthy diet and start exercising, even if it is going for a walk around the neighborhood.   You want to start weaning yourself off Celexa so we will do this slowly.   Lets start with alternating every day the amount. 20/40/20/40. We will do this for a month. After a month, please start taking 20 mg and see how you react to that. If you feel jittery or short of breath, have a headache then go back to alternating. We will re-evaluate after you have been taking 20 mg for a few months.   Please call me if you have any questions or experience any episodes of depression. If you have any thoughts of suicide, please go directly to the ER.   Follow up with me at your convenience for a complete physical   Health Maintenance Adopting a healthy lifestyle and getting preventive care can go a long way to promote health and wellness. Talk with your health care provider about what schedule of regular examinations is right for you. This is a good chance for you to check in with your provider about disease prevention and staying healthy. In between checkups, there are plenty of things you can do on your own. Experts have done a lot of research about which lifestyle changes and preventive measures are most likely to keep you healthy. Ask your health care provider for more information. WEIGHT AND DIET  Eat a healthy diet  Be sure to include plenty of vegetables, fruits, low-fat dairy products, and lean protein.  Do not eat a lot of foods high in solid fats, added sugars, or salt.  Get regular exercise. This is one of the most important things you can do for your health.  Most adults should exercise for at least 150 minutes each week. The exercise should increase your heart rate and make you sweat (moderate-intensity exercise).  Most adults should also do strengthening exercises at least twice a week. This is in addition to the moderate-intensity exercise.   Maintain a healthy weight  Body mass index (BMI) is a measurement that can be used to identify possible weight problems. It estimates body fat based on height and weight. Your health care provider can help determine your BMI and help you achieve or maintain a healthy weight.  For females 60 years of age and older:   A BMI below 18.5 is considered underweight.  A BMI of 18.5 to 24.9 is normal.  A BMI of 25 to 29.9 is considered overweight.  A BMI of 30 and above is considered obese.  Watch levels of cholesterol and blood lipids  You should start having your blood tested for lipids and cholesterol at 74 years of age, then have this test every 5 years.  You may need to have your cholesterol levels checked more often if:  Your lipid or cholesterol levels are high.  You are older than 74 years of age.  You are at high risk for heart disease.  CANCER SCREENING   Lung Cancer  Lung cancer screening is recommended for adults 51-80 years old who are at high risk for lung cancer because of a history of smoking.  A yearly low-dose CT scan of the lungs is recommended for people who:  Currently smoke.  Have quit within the past 15 years.  Have at least a 30-pack-year history of smoking. A pack year is smoking an average of one pack of cigarettes a day for 1 year.  Yearly screening should continue until it has been 15 years since you quit.  Yearly screening should stop if you develop a health problem that would prevent you from having lung cancer treatment.  Breast Cancer  Practice breast self-awareness. This means understanding how your breasts normally appear and feel.  It also means doing regular breast self-exams. Let your health care provider know about any changes, no matter how small.  If you are in your 20s or 30s, you should have a clinical breast exam (CBE) by a health care provider every 1-3 years as part of a regular health exam.  If you are 66 or older, have a  CBE every year. Also consider having a breast X-ray (mammogram) every year.  If you have a family history of breast cancer, talk to your health care provider about genetic screening.  If you are at high risk for breast cancer, talk to your health care provider about having an MRI and a mammogram every year.  Breast cancer gene (BRCA) assessment is recommended for women who have family members with BRCA-related cancers. BRCA-related cancers include:  Breast.  Ovarian.  Tubal.  Peritoneal cancers.  Results of the assessment will determine the need for genetic counseling and BRCA1 and BRCA2 testing. Cervical Cancer Routine pelvic examinations to screen for cervical cancer are no longer recommended for nonpregnant women who are considered low risk for cancer of the pelvic organs (ovaries, uterus, and vagina) and who do not have symptoms. A pelvic examination may be necessary if you have symptoms including those associated with pelvic infections. Ask your health care provider if a screening pelvic exam is right for you.   The Pap test is the screening test for cervical cancer for women who are considered at risk.  If you had a hysterectomy for a problem that was not cancer or a condition that could lead to cancer, then you no longer need Pap tests.  If you are older than 65 years, and you have had normal Pap tests for the past 10 years, you no longer need to have Pap tests.  If you have had past treatment for cervical cancer or a condition that could lead to cancer, you need Pap tests and screening for cancer for at least 20 years after your treatment.  If you no longer get a Pap test, assess your risk factors if they change (such as having a new sexual partner). This can affect whether you should start being screened again.  Some women have medical problems that increase their chance of getting cervical cancer. If this is the case for you, your health care provider may recommend more  frequent screening and Pap tests.  The human papillomavirus (HPV) test is another test that may be used for cervical cancer screening. The HPV test looks for the virus that can cause cell changes in the cervix. The cells collected during the Pap test can be tested for HPV.  The HPV test can be used to screen women 48 years of age and older. Getting tested for HPV can extend the interval between normal Pap tests from three to five years.  An HPV test also should be used to screen women of any age who have unclear Pap test results.  After 74 years of age, women should have HPV testing as often as Pap tests.  Colorectal Cancer  This type of cancer can be detected and often prevented.  Routine colorectal cancer screening usually begins at 74 years of age and continues  through 74 years of age.  Your health care provider may recommend screening at an earlier age if you have risk factors for colon cancer.  Your health care provider may also recommend using home test kits to check for hidden blood in the stool.  A small camera at the end of a tube can be used to examine your colon directly (sigmoidoscopy or colonoscopy). This is done to check for the earliest forms of colorectal cancer.  Routine screening usually begins at age 16.  Direct examination of the colon should be repeated every 5-10 years through 74 years of age. However, you may need to be screened more often if early forms of precancerous polyps or small growths are found. Skin Cancer  Check your skin from head to toe regularly.  Tell your health care provider about any new moles or changes in moles, especially if there is a change in a mole's shape or color.  Also tell your health care provider if you have a mole that is larger than the size of a pencil eraser.  Always use sunscreen. Apply sunscreen liberally and repeatedly throughout the day.  Protect yourself by wearing long sleeves, pants, a wide-brimmed hat, and sunglasses  whenever you are outside. HEART DISEASE, DIABETES, AND HIGH BLOOD PRESSURE   Have your blood pressure checked at least every 1-2 years. High blood pressure causes heart disease and increases the risk of stroke.  If you are between 42 years and 66 years old, ask your health care provider if you should take aspirin to prevent strokes.  Have regular diabetes screenings. This involves taking a blood sample to check your fasting blood sugar level.  If you are at a normal weight and have a low risk for diabetes, have this test once every three years after 74 years of age.  If you are overweight and have a high risk for diabetes, consider being tested at a younger age or more often. PREVENTING INFECTION  Hepatitis B  If you have a higher risk for hepatitis B, you should be screened for this virus. You are considered at high risk for hepatitis B if:  You were born in a country where hepatitis B is common. Ask your health care provider which countries are considered high risk.  Your parents were born in a high-risk country, and you have not been immunized against hepatitis B (hepatitis B vaccine).  You have HIV or AIDS.  You use needles to inject street drugs.  You live with someone who has hepatitis B.  You have had sex with someone who has hepatitis B.  You get hemodialysis treatment.  You take certain medicines for conditions, including cancer, organ transplantation, and autoimmune conditions. Hepatitis C  Blood testing is recommended for:  Everyone born from [birth year] through 1965.  Anyone with known risk factors for hepatitis C. Sexually transmitted infections (STIs)  You should be screened for sexually transmitted infections (STIs) including gonorrhea and chlamydia if:  You are sexually active and are younger than 74 years of age.  You are older than 74 years of age and your health care provider tells you that you are at risk for this type of infection.  Your sexual activity  has changed since you were last screened and you are at an increased risk for chlamydia or gonorrhea. Ask your health care provider if you are at risk.  If you do not have HIV, but are at risk, it may be recommended that you take a prescription medicine daily  to prevent HIV infection. This is called pre-exposure prophylaxis (PrEP). You are considered at risk if:  You are sexually active and do not regularly use condoms or know the HIV status of your partner(s).  You take drugs by injection.  You are sexually active with a partner who has HIV. Talk with your health care provider about whether you are at high risk of being infected with HIV. If you choose to begin PrEP, you should first be tested for HIV. You should then be tested every 3 months for as long as you are taking PrEP.  PREGNANCY   If you are premenopausal and you may become pregnant, ask your health care provider about preconception counseling.  If you may become pregnant, take 400 to 800 micrograms (mcg) of folic acid every day.  If you want to prevent pregnancy, talk to your health care provider about birth control (contraception). OSTEOPOROSIS AND MENOPAUSE   Osteoporosis is a disease in which the bones lose minerals and strength with aging. This can result in serious bone fractures. Your risk for osteoporosis can be identified using a bone density scan.  If you are 33 years of age or older, or if you are at risk for osteoporosis and fractures, ask your health care provider if you should be screened.  Ask your health care provider whether you should take a calcium or vitamin D supplement to lower your risk for osteoporosis.  Menopause may have certain physical symptoms and risks.  Hormone replacement therapy may reduce some of these symptoms and risks. Talk to your health care provider about whether hormone replacement therapy is right for you.  HOME CARE INSTRUCTIONS   Schedule regular health, dental, and eye  exams.  Stay current with your immunizations.   Do not use any tobacco products including cigarettes, chewing tobacco, or electronic cigarettes.  If you are pregnant, do not drink alcohol.  If you are breastfeeding, limit how much and how often you drink alcohol.  Limit alcohol intake to no more than 1 drink per day for nonpregnant women. One drink equals 12 ounces of beer, 5 ounces of wine, or 1 ounces of hard liquor.  Do not use street drugs.  Do not share needles.  Ask your health care provider for help if you need support or information about quitting drugs.  Tell your health care provider if you often feel depressed.  Tell your health care provider if you have ever been abused or do not feel safe at home. Document Released: 04/12/2011 Document Revised: 02/11/2014 Document Reviewed: 08/29/2013 [facility] Patient Information 2015 [place], [place]. This information is not intended to replace advice given to you by your health care provider. Make sure you discuss any questions you have with your health care provider.

## 2015-02-07 NOTE — Progress Notes (Signed)
Pre visit review using our clinic review tool, if applicable. No additional management support is needed unless otherwise documented below in the visit note. 

## 2015-03-20 ENCOUNTER — Other Ambulatory Visit (HOSPITAL_BASED_OUTPATIENT_CLINIC_OR_DEPARTMENT_OTHER): Payer: Medicare Other

## 2015-03-20 ENCOUNTER — Encounter: Payer: Self-pay | Admitting: Hematology and Oncology

## 2015-03-20 ENCOUNTER — Telehealth: Payer: Self-pay | Admitting: Hematology and Oncology

## 2015-03-20 ENCOUNTER — Ambulatory Visit (HOSPITAL_BASED_OUTPATIENT_CLINIC_OR_DEPARTMENT_OTHER): Payer: Medicare Other | Admitting: Hematology and Oncology

## 2015-03-20 VITALS — BP 124/68 | HR 66 | Temp 97.5°F | Resp 17 | Ht 63.0 in | Wt 131.6 lb

## 2015-03-20 DIAGNOSIS — Z8572 Personal history of non-Hodgkin lymphomas: Secondary | ICD-10-CM

## 2015-03-20 DIAGNOSIS — R21 Rash and other nonspecific skin eruption: Secondary | ICD-10-CM

## 2015-03-20 DIAGNOSIS — C8590 Non-Hodgkin lymphoma, unspecified, unspecified site: Secondary | ICD-10-CM

## 2015-03-20 LAB — CBC WITH DIFFERENTIAL/PLATELET
BASO%: 1.3 % (ref 0.0–2.0)
Basophils Absolute: 0.1 10*3/uL (ref 0.0–0.1)
EOS%: 4.5 % (ref 0.0–7.0)
Eosinophils Absolute: 0.3 10*3/uL (ref 0.0–0.5)
HCT: 39 % (ref 34.8–46.6)
HEMOGLOBIN: 13.4 g/dL (ref 11.6–15.9)
LYMPH%: 14.1 % (ref 14.0–49.7)
MCH: 32.7 pg (ref 25.1–34.0)
MCHC: 34.3 g/dL (ref 31.5–36.0)
MCV: 95.2 fL (ref 79.5–101.0)
MONO#: 0.8 10*3/uL (ref 0.1–0.9)
MONO%: 12.8 % (ref 0.0–14.0)
NEUT#: 4.1 10*3/uL (ref 1.5–6.5)
NEUT%: 67.3 % (ref 38.4–76.8)
PLATELETS: 317 10*3/uL (ref 145–400)
RBC: 4.1 10*6/uL (ref 3.70–5.45)
RDW: 13.2 % (ref 11.2–14.5)
WBC: 6 10*3/uL (ref 3.9–10.3)
lymph#: 0.9 10*3/uL (ref 0.9–3.3)

## 2015-03-20 LAB — LACTATE DEHYDROGENASE (CC13): LDH: 277 U/L — ABNORMAL HIGH (ref 125–245)

## 2015-03-20 LAB — COMPREHENSIVE METABOLIC PANEL (CC13)
ALBUMIN: 3.8 g/dL (ref 3.5–5.0)
ALT: 7 U/L (ref 0–55)
ANION GAP: 9 meq/L (ref 3–11)
AST: 17 U/L (ref 5–34)
Alkaline Phosphatase: 148 U/L (ref 40–150)
BILIRUBIN TOTAL: 0.53 mg/dL (ref 0.20–1.20)
BUN: 16.3 mg/dL (ref 7.0–26.0)
CO2: 29 mEq/L (ref 22–29)
Calcium: 9.3 mg/dL (ref 8.4–10.4)
Chloride: 103 mEq/L (ref 98–109)
Creatinine: 0.8 mg/dL (ref 0.6–1.1)
EGFR: 71 mL/min/{1.73_m2} — ABNORMAL LOW (ref >90)
Glucose: 90 mg/dl (ref 70–140)
POTASSIUM: 4.1 meq/L (ref 3.5–5.1)
SODIUM: 140 meq/L (ref 136–145)
Total Protein: 6.5 g/dL (ref 6.4–8.3)

## 2015-03-20 MED ORDER — PREDNISONE 20 MG PO TABS: 20.0000 mg | ORAL_TABLET | Freq: Every day | ORAL | Status: DC

## 2015-03-20 NOTE — Assessment & Plan Note (Signed)
Currently, she has no signs of disease. The patient is a long-term cancer survivor and currently is 5 years out from last recurrence. There is no data beyond 2 years to use maintenance rituximab. Continue supportive care.

## 2015-03-20 NOTE — Progress Notes (Signed)
[facility] OFFICE PROGRESS NOTE  Patient Care Team: [name], NP as PCP - General (Family Medicine)  SUMMARY OF ONCOLOGIC HISTORY: Oncology History   --Early 2000.  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 She was treated with chlorambucil in 2000.   --2007 - 09/2008.  Treated with single agent Rituxan.   --12/2008.  She had progressive adenopathy in chest and abdomen and repeat biopsy showed low grade follicular and diffuse B cell NHL.   --01/2009 - 07/2009. She required left ureteral stent and was treated with Treanda/Rituxan x 5 cycles from 02-2009 thru 06-2009.   --06/2009.  On maintenance Rituxan every 3 months without difficulty.  --04/02/2013.    CT CAP demonstrated  stable disease No evidence of lymphoma recurrence within the abdomen or pelvis but new mild right axillary lymphadenopathy without mediastinal adenopathy     INTERVAL HISTORY: Please see below for problem oriented charting. She returns for further follow-up. She had excoriated skin rash in her occipital region, bilateral anterior shin area and the upper back They are tender and itchy She denies new lymphadenopathy Denies recent infection  REVIEW OF SYSTEMS:   Constitutional: Denies fevers, chills or abnormal weight loss Eyes: Denies blurriness of vision Ears, nose, mouth, throat, and face: Denies mucositis or sore throat Respiratory: Denies cough, dyspnea or wheezes Cardiovascular: Denies palpitation, chest discomfort or lower extremity swelling Gastrointestinal:  Denies nausea, heartburn or change in bowel habits Lymphatics: Denies new lymphadenopathy or easy bruising Neurological:Denies numbness, tingling or new weaknesses Behavioral/Psych: Mood is stable, no new changes  All other systems were reviewed with the patient and are negative.  I have reviewed the past medical history, past  surgical history, social history and family history with the patient and they are unchanged from previous note.  ALLERGIES:  is allergic to contrast media; iohexol; povidone-iodine; and penicillins.  MEDICATIONS:  Current Outpatient Prescriptions  Medication Sig Dispense Refill  . aspirin 81 MG tablet Take 81 mg by mouth daily.    . Cholecalciferol 2000 UNITS TABS Take 1 tablet by mouth daily.      . citalopram (CELEXA) 40 MG tablet Take 1 tablet (40 mg total) by mouth daily. 90 tablet 3  . furosemide (LASIX) 20 MG tablet Take one tablet by mouth every 3 days as needed for swelling. 90 tablet 3  . predniSONE (DELTASONE) 20 MG tablet Take 1 tablet (20 mg total) by mouth daily with breakfast. 14 tablet 0   No current facility-administered medications for this visit.    PHYSICAL EXAMINATION: ECOG PERFORMANCE STATUS: 1 - Symptomatic but completely ambulatory  Filed Vitals:   03/20/15 0950  BP: 124/68  Pulse: 66  Temp: 97.5 F (36.4 C)  Resp: 17   Filed Weights   03/20/15 0950  Weight: 131 lb 9.6 oz (59.693 kg)    GENERAL:alert, no distress and comfortable SKIN: Noted excoriated skin rash. I told the patient to take pictures EYES: normal, Conjunctiva are pink and non-injected, sclera clear OROPHARYNX:no exudate, no erythema and lips, buccal mucosa, and tongue normal  NECK: supple, thyroid normal size, non-tender, without nodularity LYMPH:  no palpable lymphadenopathy in the cervical, axillary or inguinal LUNGS: clear to auscultation and percussion with normal breathing effort HEART: regular rate & rhythm and no murmurs and no lower extremity edema ABDOMEN:abdomen soft, non-tender and normal bowel sounds Musculoskeletal:no cyanosis of digits and no clubbing  NEURO: alert & oriented x 3 with fluent  speech, no focal motor/sensory deficits  LABORATORY DATA:  I have reviewed the data as listed    Component Value Date/Time   NA 140 03/20/2015 0936   NA 142 03/17/2012 0929   K  4.1 03/20/2015 0936   K 3.6 03/17/2012 0929   CL 101 03/28/2013 0826   CL 104 03/17/2012 0929   CO2 29 03/20/2015 0936   CO2 29 03/17/2012 0929   GLUCOSE 90 03/20/2015 0936   GLUCOSE 84 03/28/2013 0826   GLUCOSE 84 03/17/2012 0929   BUN 16.3 03/20/2015 0936   BUN 11 03/17/2012 0929   CREATININE 0.8 03/20/2015 0936   CREATININE 0.76 03/17/2012 0929   CALCIUM 9.3 03/20/2015 0936   CALCIUM 9.6 03/17/2012 0929   PROT 6.5 03/20/2015 0936   PROT 6.2 03/17/2012 0929   ALBUMIN 3.8 03/20/2015 0936   ALBUMIN 4.4 03/17/2012 0929   AST 17 03/20/2015 0936   AST 18 03/17/2012 0929   ALT 7 03/20/2015 0936   ALT 9 03/17/2012 0929   ALKPHOS 148 03/20/2015 0936   ALKPHOS 123* 03/17/2012 0929   BILITOT 0.53 03/20/2015 0936   BILITOT 0.5 03/17/2012 0929   GFRNONAA 80.06 05/22/2010 1106   GFRAA  03/27/2009 1035    >60        The eGFR has been calculated using the MDRD equation. This calculation has not been validated in all clinical situations. eGFR's persistently <60 mL/min signify possible Chronic Kidney Disease.    No results found for: SPEP, UPEP  Lab Results  Component Value Date   WBC 6.0 03/20/2015   NEUTROABS 4.1 03/20/2015   HGB 13.4 03/20/2015   HCT 39.0 03/20/2015   MCV 95.2 03/20/2015   PLT 317 03/20/2015      Chemistry      Component Value Date/Time   NA 140 03/20/2015 0936   NA 142 03/17/2012 0929   K 4.1 03/20/2015 0936   K 3.6 03/17/2012 0929   CL 101 03/28/2013 0826   CL 104 03/17/2012 0929   CO2 29 03/20/2015 0936   CO2 29 03/17/2012 0929   BUN 16.3 03/20/2015 0936   BUN 11 03/17/2012 0929   CREATININE 0.8 03/20/2015 0936   CREATININE 0.76 03/17/2012 0929      Component Value Date/Time   CALCIUM 9.3 03/20/2015 0936   CALCIUM 9.6 03/17/2012 0929   ALKPHOS 148 03/20/2015 0936   ALKPHOS 123* 03/17/2012 0929   AST 17 03/20/2015 0936   AST 18 03/17/2012 0929   ALT 7 03/20/2015 0936   ALT 9 03/17/2012 0929   BILITOT 0.53 03/20/2015 0936   BILITOT  0.5 03/17/2012 0929      ASSESSMENT & PLAN:  NHL (non-Hodgkin's lymphoma) Currently, she has no signs of disease. The patient is a long-term cancer survivor and currently is 5 years out from last recurrence. There is no data beyond 2 years to use maintenance rituximab. Continue supportive care.  Excoriated rash She has very unusual excoriated rash It could be related to dermatitis I recommend a trial of prednisone and reassess in 10 days It does not improve, we will get dermatology consult and repeat skin biopsy Clinically, it does not look like shingles   No orders of the defined types were placed in this encounter.   All questions were answered. The patient knows to call the clinic with any problems, questions or concerns. No barriers to learning was detected. I spent 15 minutes counseling the patient face to face. The total time spent in the appointment was  20 minutes and more than 50% was on counseling and review of test results     [facility], Devone Bonilla, MD 03/20/2015 1:29 PM

## 2015-03-20 NOTE — Assessment & Plan Note (Signed)
She has very unusual excoriated rash It could be related to dermatitis I recommend a trial of prednisone and reassess in 10 days It does not improve, we will get dermatology consult and repeat skin biopsy Clinically, it does not look like shingles

## 2015-03-20 NOTE — Telephone Encounter (Signed)
Pt confirmed MD visit per 06/09 POF, gave pt AVS and Calendar.... KJ

## 2015-03-31 ENCOUNTER — Ambulatory Visit (HOSPITAL_BASED_OUTPATIENT_CLINIC_OR_DEPARTMENT_OTHER): Payer: Medicare Other | Admitting: Hematology and Oncology

## 2015-03-31 ENCOUNTER — Telehealth: Payer: Self-pay | Admitting: Hematology and Oncology

## 2015-03-31 ENCOUNTER — Encounter: Payer: Self-pay | Admitting: Hematology and Oncology

## 2015-03-31 VITALS — BP 123/53 | HR 73 | Temp 97.7°F | Resp 18

## 2015-03-31 DIAGNOSIS — R21 Rash and other nonspecific skin eruption: Secondary | ICD-10-CM

## 2015-03-31 DIAGNOSIS — C8590 Non-Hodgkin lymphoma, unspecified, unspecified site: Secondary | ICD-10-CM | POA: Diagnosis not present

## 2015-03-31 MED ORDER — PREDNISONE 5 MG PO TABS: 5.0000 mg | ORAL_TABLET | Freq: Every day | ORAL | Status: DC

## 2015-03-31 NOTE — Telephone Encounter (Signed)
Gave avs & calendar for September °

## 2015-03-31 NOTE — Assessment & Plan Note (Signed)
Currently, she has no signs of disease. The patient is a long-term cancer survivor and currently is 5 years out from last recurrence. There is no data beyond 2 years to use maintenance rituximab. Continue supportive care.

## 2015-03-31 NOTE — Progress Notes (Signed)
[facility] OFFICE PROGRESS NOTE  Patient Care Team: [name], NP as PCP - General (Family Medicine)  SUMMARY OF ONCOLOGIC HISTORY: Oncology History   --Early 2000.  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 She was treated with chlorambucil in 2000.   --2007 - 09/2008.  Treated with single agent Rituxan.   --12/2008.  She had progressive adenopathy in chest and abdomen and repeat biopsy showed low grade follicular and diffuse B cell NHL.   --01/2009 - 07/2009. She required left ureteral stent and was treated with Treanda/Rituxan x 5 cycles from 02-2009 thru 06-2009.   --06/2009.  On maintenance Rituxan every 3 months without difficulty.  --04/02/2013.    CT CAP demonstrated  stable disease No evidence of lymphoma recurrence within the abdomen or pelvis but new mild right axillary lymphadenopathy without mediastinal adenopathy     INTERVAL HISTORY: Please see below for problem oriented charting.  she returns for further follow-up on the skin rash.  it has improved with prednisone. She complained of feeling "edgy" with prednisone but otherwise tolerated treatment well.  REVIEW OF SYSTEMS:   Constitutional: Denies fevers, chills or abnormal weight loss Eyes: Denies blurriness of vision Ears, nose, mouth, throat, and face: Denies mucositis or sore throat Respiratory: Denies cough, dyspnea or wheezes Cardiovascular: Denies palpitation, chest discomfort or lower extremity swelling Gastrointestinal:  Denies nausea, heartburn or change in bowel habits Lymphatics: Denies new lymphadenopathy or easy bruising Neurological:Denies numbness, tingling or new weaknesses Behavioral/Psych: Mood is stable, no new changes  All other systems were reviewed with the patient and are negative.  I have reviewed the past medical history, past surgical history, social history and family  history with the patient and they are unchanged from previous note.  ALLERGIES:  is allergic to contrast media; iohexol; povidone-iodine; and penicillins.  MEDICATIONS:  Current Outpatient Prescriptions  Medication Sig Dispense Refill  . aspirin 81 MG tablet Take 81 mg by mouth daily.    . Cholecalciferol 2000 UNITS TABS Take 1 tablet by mouth daily.      . citalopram (CELEXA) 40 MG tablet Take 1 tablet (40 mg total) by mouth daily. 90 tablet 3  . furosemide (LASIX) 20 MG tablet Take one tablet by mouth every 3 days as needed for swelling. 90 tablet 3  . predniSONE (DELTASONE) 20 MG tablet Take 1 tablet (20 mg total) by mouth daily with breakfast. 14 tablet 0  . predniSONE (DELTASONE) 5 MG tablet Take 1 tablet (5 mg total) by mouth daily with breakfast. 50 tablet 0   No current facility-administered medications for this visit.    PHYSICAL EXAMINATION: ECOG PERFORMANCE STATUS: 0 - Asymptomatic  Filed Vitals:   03/31/15 1016  BP: 123/53  Pulse: 73  Temp: 97.7 F (36.5 C)  Resp: 18   Filed Weights    GENERAL:alert, no distress and comfortable SKIN: Skin rash has improved but not completely resolved EYES: normal, Conjunctiva are pink and non-injected, sclera clear Musculoskeletal:no cyanosis of digits and no clubbing  NEURO: alert & oriented x 3 with fluent speech, no focal motor/sensory deficits  LABORATORY DATA:  I have reviewed the data as listed    Component Value Date/Time   NA 140 03/20/2015 0936   NA 142 03/17/2012 0929   K 4.1 03/20/2015 0936   K 3.6 03/17/2012 0929   CL 101 03/28/2013 0826   CL 104 03/17/2012 0929   CO2 29 03/20/2015 0936  CO2 29 03/17/2012 0929   GLUCOSE 90 03/20/2015 0936   GLUCOSE 84 03/28/2013 0826   GLUCOSE 84 03/17/2012 0929   BUN 16.3 03/20/2015 0936   BUN 11 03/17/2012 0929   CREATININE 0.8 03/20/2015 0936   CREATININE 0.76 03/17/2012 0929   CALCIUM 9.3 03/20/2015 0936   CALCIUM 9.6 03/17/2012 0929   PROT 6.5 03/20/2015 0936    PROT 6.2 03/17/2012 0929   ALBUMIN 3.8 03/20/2015 0936   ALBUMIN 4.4 03/17/2012 0929   AST 17 03/20/2015 0936   AST 18 03/17/2012 0929   ALT 7 03/20/2015 0936   ALT 9 03/17/2012 0929   ALKPHOS 148 03/20/2015 0936   ALKPHOS 123* 03/17/2012 0929   BILITOT 0.53 03/20/2015 0936   BILITOT 0.5 03/17/2012 0929   GFRNONAA 80.06 05/22/2010 1106   GFRAA  03/27/2009 1035    >60        The eGFR has been calculated using the MDRD equation. This calculation has not been validated in all clinical situations. eGFR's persistently <60 mL/min signify possible Chronic Kidney Disease.    No results found for: SPEP, UPEP  Lab Results  Component Value Date   WBC 6.0 03/20/2015   NEUTROABS 4.1 03/20/2015   HGB 13.4 03/20/2015   HCT 39.0 03/20/2015   MCV 95.2 03/20/2015   PLT 317 03/20/2015      Chemistry      Component Value Date/Time   NA 140 03/20/2015 0936   NA 142 03/17/2012 0929   K 4.1 03/20/2015 0936   K 3.6 03/17/2012 0929   CL 101 03/28/2013 0826   CL 104 03/17/2012 0929   CO2 29 03/20/2015 0936   CO2 29 03/17/2012 0929   BUN 16.3 03/20/2015 0936   BUN 11 03/17/2012 0929   CREATININE 0.8 03/20/2015 0936   CREATININE 0.76 03/17/2012 0929      Component Value Date/Time   CALCIUM 9.3 03/20/2015 0936   CALCIUM 9.6 03/17/2012 0929   ALKPHOS 148 03/20/2015 0936   ALKPHOS 123* 03/17/2012 0929   AST 17 03/20/2015 0936   AST 18 03/17/2012 0929   ALT 7 03/20/2015 0936   ALT 9 03/17/2012 0929   BILITOT 0.53 03/20/2015 0936   BILITOT 0.5 03/17/2012 0929       ASSESSMENT & PLAN:  NHL (non-Hodgkin's lymphoma) Currently, she has no signs of disease. The patient is a long-term cancer survivor and currently is 5 years out from last recurrence. There is no data beyond 2 years to use maintenance rituximab. Continue supportive care.    Excoriated rash She has very unusual excoriated rash It could be related to dermatitis,  and has responded to prednisone but not completely  resolved. Clinically, it does not look like shingles  I recommend a course of prednisone taper, reducing to 15 mg 1 week, 10 mg 1 week and 5 mg tablet once week. If the rash relapse with discontinuation of prednisone, I would refer her to dermatologist for further management.     Orders Placed This Encounter  Procedures  . CBC with Differential/Platelet    Standing Status: Future     Number of Occurrences:      Standing Expiration Date: 05/04/2016  . Comprehensive metabolic panel    Standing Status: Future     Number of Occurrences:      Standing Expiration Date: 05/04/2016  . Lactate dehydrogenase    Standing Status: Future     Number of Occurrences:      Standing Expiration Date: 05/04/2016  All questions were answered. The patient knows to call the clinic with any problems, questions or concerns. No barriers to learning was detected. I spent 15 minutes counseling the patient face to face. The total time spent in the appointment was 20 minutes and more than 50% was on counseling and review of test results     [facility], [place], MD 03/31/2015 11:41 AM

## 2015-03-31 NOTE — Assessment & Plan Note (Signed)
She has very unusual excoriated rash It could be related to dermatitis,  and has responded to prednisone but not completely resolved. Clinically, it does not look like shingles  I recommend a course of prednisone taper, reducing to 15 mg 1 week, 10 mg 1 week and 5 mg tablet once week. If the rash relapse with discontinuation of prednisone, I would refer her to dermatologist for further management.

## 2015-07-01 ENCOUNTER — Encounter: Payer: Self-pay | Admitting: Hematology and Oncology

## 2015-07-01 ENCOUNTER — Telehealth: Payer: Self-pay | Admitting: Hematology and Oncology

## 2015-07-01 ENCOUNTER — Other Ambulatory Visit (HOSPITAL_BASED_OUTPATIENT_CLINIC_OR_DEPARTMENT_OTHER): Payer: Medicare Other

## 2015-07-01 ENCOUNTER — Ambulatory Visit (HOSPITAL_BASED_OUTPATIENT_CLINIC_OR_DEPARTMENT_OTHER): Payer: Medicare Other | Admitting: Hematology and Oncology

## 2015-07-01 VITALS — BP 133/62 | HR 79 | Temp 98.2°F | Resp 18 | Ht 63.0 in | Wt 125.5 lb

## 2015-07-01 DIAGNOSIS — Z23 Encounter for immunization: Secondary | ICD-10-CM

## 2015-07-01 DIAGNOSIS — C8590 Non-Hodgkin lymphoma, unspecified, unspecified site: Secondary | ICD-10-CM

## 2015-07-01 DIAGNOSIS — R21 Rash and other nonspecific skin eruption: Secondary | ICD-10-CM | POA: Diagnosis not present

## 2015-07-01 LAB — CBC WITH DIFFERENTIAL/PLATELET
BASO%: 1.1 % (ref 0.0–2.0)
Basophils Absolute: 0.1 10*3/uL (ref 0.0–0.1)
EOS%: 4.8 % (ref 0.0–7.0)
Eosinophils Absolute: 0.3 10*3/uL (ref 0.0–0.5)
HCT: 40.7 % (ref 34.8–46.6)
HGB: 13.7 g/dL (ref 11.6–15.9)
LYMPH%: 13.8 % — AB (ref 14.0–49.7)
MCH: 32.1 pg (ref 25.1–34.0)
MCHC: 33.8 g/dL (ref 31.5–36.0)
MCV: 95 fL (ref 79.5–101.0)
MONO#: 0.7 10*3/uL (ref 0.1–0.9)
MONO%: 11.2 % (ref 0.0–14.0)
NEUT#: 4.4 10*3/uL (ref 1.5–6.5)
NEUT%: 69.1 % (ref 38.4–76.8)
PLATELETS: 328 10*3/uL (ref 145–400)
RBC: 4.28 10*6/uL (ref 3.70–5.45)
RDW: 13.1 % (ref 11.2–14.5)
WBC: 6.3 10*3/uL (ref 3.9–10.3)
lymph#: 0.9 10*3/uL (ref 0.9–3.3)

## 2015-07-01 LAB — COMPREHENSIVE METABOLIC PANEL (CC13)
ALT: 9 U/L (ref 0–55)
ANION GAP: 9 meq/L (ref 3–11)
AST: 14 U/L (ref 5–34)
Albumin: 3.8 g/dL (ref 3.5–5.0)
Alkaline Phosphatase: 121 U/L (ref 40–150)
BUN: 16.2 mg/dL (ref 7.0–26.0)
CHLORIDE: 104 meq/L (ref 98–109)
CO2: 28 meq/L (ref 22–29)
Calcium: 9.3 mg/dL (ref 8.4–10.4)
Creatinine: 0.8 mg/dL (ref 0.6–1.1)
EGFR: 72 mL/min/{1.73_m2} — AB (ref >90)
Glucose: 88 mg/dl (ref 70–140)
POTASSIUM: 3.4 meq/L — AB (ref 3.5–5.1)
Sodium: 141 mEq/L (ref 136–145)
Total Bilirubin: 0.41 mg/dL (ref 0.20–1.20)
Total Protein: 6.3 g/dL — ABNORMAL LOW (ref 6.4–8.3)

## 2015-07-01 LAB — LACTATE DEHYDROGENASE (CC13): LDH: 236 U/L (ref 125–245)

## 2015-07-01 MED ORDER — INFLUENZA VAC SPLIT QUAD 0.5 ML IM SUSY
0.5000 mL | PREFILLED_SYRINGE | Freq: Once | INTRAMUSCULAR | Status: AC
  Administered 2015-07-01: 0.5 mL via INTRAMUSCULAR
  Filled 2015-07-01: qty 0.5

## 2015-07-01 NOTE — Assessment & Plan Note (Signed)
She has very unusual excoriated rash It could be related to dermatitis,  and has responded to prednisone but not completely resolved. Clinically, it does not look like shingles She has appointment to see dermatologist tomorrow. I will defer to them for further management.

## 2015-07-01 NOTE — Telephone Encounter (Signed)
Gave and printed avs °

## 2015-07-01 NOTE — Assessment & Plan Note (Signed)
Currently, she has no signs of disease. The patient is a long-term cancer survivor and currently is 5 years out from last recurrence. There is no data beyond 2 years to use maintenance rituximab. Continue supportive care. 

## 2015-07-01 NOTE — Progress Notes (Signed)
[facility] OFFICE PROGRESS NOTE  Patient Care Team: [name], NP as PCP - General (Family Medicine)  SUMMARY OF ONCOLOGIC HISTORY: Oncology History   --Early 2000.  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 She was treated with chlorambucil in 2000.   --2007 - 09/2008.  Treated with single agent Rituxan.   --12/2008.  She had progressive adenopathy in chest and abdomen and repeat biopsy showed low grade follicular and diffuse B cell NHL.   --01/2009 - 07/2009. She required left ureteral stent and was treated with Treanda/Rituxan x 5 cycles from 02-2009 thru 06-2009.   --06/2009.  On maintenance Rituxan every 3 months without difficulty.  --04/02/2013.    CT CAP demonstrated  stable disease No evidence of lymphoma recurrence within the abdomen or pelvis but new mild right axillary lymphadenopathy without mediastinal adenopathy     INTERVAL HISTORY: Please see below for problem oriented charting. She is seen for further review. Since the last time I saw her, she have recurrence of her excoriated rash throughout her body. It was intensely itchy. She has been putting some topical cream on top of it but they were not healing. She denies new lymphadenopathy. Denies recent infection.  REVIEW OF SYSTEMS:   Constitutional: Denies fevers, chills or abnormal weight loss Eyes: Denies blurriness of vision Ears, nose, mouth, throat, and face: Denies mucositis or sore throat Respiratory: Denies cough, dyspnea or wheezes Cardiovascular: Denies palpitation, chest discomfort or lower extremity swelling Gastrointestinal:  Denies nausea, heartburn or change in bowel habits Lymphatics: Denies new lymphadenopathy or easy bruising Neurological:Denies numbness, tingling or new weaknesses Behavioral/Psych: Mood is stable, no new changes  All other systems were reviewed with the patient  and are negative.  I have reviewed the past medical history, past surgical history, social history and family history with the patient and they are unchanged from previous note.  ALLERGIES:  is allergic to contrast media; iohexol; povidone-iodine; and penicillins.  MEDICATIONS:  Current Outpatient Prescriptions  Medication Sig Dispense Refill  . aspirin 81 MG tablet Take 81 mg by mouth daily.    . Cholecalciferol 2000 UNITS TABS Take 1 tablet by mouth daily.      . citalopram (CELEXA) 40 MG tablet Take 1 tablet (40 mg total) by mouth daily. 90 tablet 3  . furosemide (LASIX) 20 MG tablet Take one tablet by mouth every 3 days as needed for swelling. 90 tablet 3  . Probiotic Product (PROBIOTIC MATURE ADULT PO) Take by mouth.     No current facility-administered medications for this visit.    PHYSICAL EXAMINATION: ECOG PERFORMANCE STATUS: 0 - Asymptomatic  Filed Vitals:   07/01/15 0909  BP: 133/62  Pulse: 79  Temp: 98.2 F (36.8 C)  Resp: 18   Filed Weights   07/01/15 0909  Weight: 125 lb 8 oz (56.926 kg)    GENERAL:alert, no distress and comfortable SKIN: She has diffuse excoriated skin rash throughout. EYES: normal, Conjunctiva are pink and non-injected, sclera clear OROPHARYNX:no exudate, no erythema and lips, buccal mucosa, and tongue normal  NECK: supple, thyroid normal size, non-tender, without nodularity LYMPH:  no palpable lymphadenopathy in the cervical, axillary or inguinal LUNGS: clear to auscultation and percussion with normal breathing effort HEART: regular rate & rhythm and no murmurs and no lower extremity edema ABDOMEN:abdomen soft, non-tender and normal bowel sounds Musculoskeletal:no cyanosis of digits and no clubbing  NEURO: alert & oriented x 3 with  fluent speech, no focal motor/sensory deficits  LABORATORY DATA:  I have reviewed the data as listed    Component Value Date/Time   NA 141 07/01/2015 0854   NA 142 03/17/2012 0929   K 3.4* 07/01/2015  0854   K 3.6 03/17/2012 0929   CL 101 03/28/2013 0826   CL 104 03/17/2012 0929   CO2 28 07/01/2015 0854   CO2 29 03/17/2012 0929   GLUCOSE 88 07/01/2015 0854   GLUCOSE 84 03/28/2013 0826   GLUCOSE 84 03/17/2012 0929   BUN 16.2 07/01/2015 0854   BUN 11 03/17/2012 0929   CREATININE 0.8 07/01/2015 0854   CREATININE 0.76 03/17/2012 0929   CALCIUM 9.3 07/01/2015 0854   CALCIUM 9.6 03/17/2012 0929   PROT 6.3* 07/01/2015 0854   PROT 6.2 03/17/2012 0929   ALBUMIN 3.8 07/01/2015 0854   ALBUMIN 4.4 03/17/2012 0929   AST 14 07/01/2015 0854   AST 18 03/17/2012 0929   ALT 9 07/01/2015 0854   ALT 9 03/17/2012 0929   ALKPHOS 121 07/01/2015 0854   ALKPHOS 123* 03/17/2012 0929   BILITOT 0.41 07/01/2015 0854   BILITOT 0.5 03/17/2012 0929   GFRNONAA 80.06 05/22/2010 1106   GFRAA  03/27/2009 1035    >60        The eGFR has been calculated using the MDRD equation. This calculation has not been validated in all clinical situations. eGFR's persistently <60 mL/min signify possible Chronic Kidney Disease.    No results found for: SPEP, UPEP  Lab Results  Component Value Date   WBC 6.3 07/01/2015   NEUTROABS 4.4 07/01/2015   HGB 13.7 07/01/2015   HCT 40.7 07/01/2015   MCV 95.0 07/01/2015   PLT 328 07/01/2015      Chemistry      Component Value Date/Time   NA 141 07/01/2015 0854   NA 142 03/17/2012 0929   K 3.4* 07/01/2015 0854   K 3.6 03/17/2012 0929   CL 101 03/28/2013 0826   CL 104 03/17/2012 0929   CO2 28 07/01/2015 0854   CO2 29 03/17/2012 0929   BUN 16.2 07/01/2015 0854   BUN 11 03/17/2012 0929   CREATININE 0.8 07/01/2015 0854   CREATININE 0.76 03/17/2012 0929      Component Value Date/Time   CALCIUM 9.3 07/01/2015 0854   CALCIUM 9.6 03/17/2012 0929   ALKPHOS 121 07/01/2015 0854   ALKPHOS 123* 03/17/2012 0929   AST 14 07/01/2015 0854   AST 18 03/17/2012 0929   ALT 9 07/01/2015 0854   ALT 9 03/17/2012 0929   BILITOT 0.41 07/01/2015 0854   BILITOT 0.5 03/17/2012  0929      ASSESSMENT & PLAN:  NHL (non-Hodgkin's lymphoma) Currently, she has no signs of disease. The patient is a long-term cancer survivor and currently is 5 years out from last recurrence. There is no data beyond 2 years to use maintenance rituximab. Continue supportive care.    Excoriated rash She has very unusual excoriated rash It could be related to dermatitis,  and has responded to prednisone but not completely resolved. Clinically, it does not look like shingles She has appointment to see dermatologist tomorrow. I will defer to them for further management.    Orders Placed This Encounter  Procedures  . CBC with Differential/Platelet    Standing Status: Future     Number of Occurrences:      Standing Expiration Date: 08/04/2016  . Comprehensive metabolic panel    Standing Status: Future     Number  of Occurrences:      Standing Expiration Date: 08/04/2016  . Lactate dehydrogenase    Standing Status: Future     Number of Occurrences:      Standing Expiration Date: 08/04/2016   All questions were answered. The patient knows to call the clinic with any problems, questions or concerns. No barriers to learning was detected. I spent 15 minutes counseling the patient face to face. The total time spent in the appointment was 20 minutes and more than 50% was on counseling and review of test results     [facility], [place], MD 07/01/2015 10:07 AM

## 2015-07-02 DIAGNOSIS — L080 Pyoderma: Secondary | ICD-10-CM | POA: Diagnosis not present

## 2015-07-02 DIAGNOSIS — L089 Local infection of the skin and subcutaneous tissue, unspecified: Secondary | ICD-10-CM | POA: Diagnosis not present

## 2015-07-17 DIAGNOSIS — L080 Pyoderma: Secondary | ICD-10-CM | POA: Diagnosis not present

## 2015-11-13 ENCOUNTER — Other Ambulatory Visit: Payer: Self-pay | Admitting: Adult Health

## 2015-11-13 NOTE — Telephone Encounter (Signed)
Ok to do a 90 day refill. She needs an office appointment

## 2015-12-29 ENCOUNTER — Ambulatory Visit (HOSPITAL_BASED_OUTPATIENT_CLINIC_OR_DEPARTMENT_OTHER): Payer: Medicare Other | Admitting: Hematology and Oncology

## 2015-12-29 ENCOUNTER — Encounter: Payer: Self-pay | Admitting: Hematology and Oncology

## 2015-12-29 ENCOUNTER — Other Ambulatory Visit (HOSPITAL_BASED_OUTPATIENT_CLINIC_OR_DEPARTMENT_OTHER): Payer: Medicare Other

## 2015-12-29 ENCOUNTER — Telehealth: Payer: Self-pay | Admitting: Hematology and Oncology

## 2015-12-29 VITALS — BP 127/62 | HR 78 | Temp 97.7°F | Resp 18 | Ht 63.0 in | Wt 130.1 lb

## 2015-12-29 DIAGNOSIS — C8590 Non-Hodgkin lymphoma, unspecified, unspecified site: Secondary | ICD-10-CM

## 2015-12-29 DIAGNOSIS — Z8572 Personal history of non-Hodgkin lymphomas: Secondary | ICD-10-CM

## 2015-12-29 DIAGNOSIS — Z720 Tobacco use: Secondary | ICD-10-CM | POA: Diagnosis not present

## 2015-12-29 DIAGNOSIS — R609 Edema, unspecified: Secondary | ICD-10-CM

## 2015-12-29 LAB — CBC WITH DIFFERENTIAL/PLATELET
BASO%: 0.8 % (ref 0.0–2.0)
Basophils Absolute: 0 10*3/uL (ref 0.0–0.1)
EOS%: 5.5 % (ref 0.0–7.0)
Eosinophils Absolute: 0.3 10*3/uL (ref 0.0–0.5)
HEMATOCRIT: 42.5 % (ref 34.8–46.6)
HEMOGLOBIN: 14.2 g/dL (ref 11.6–15.9)
LYMPH#: 1 10*3/uL (ref 0.9–3.3)
LYMPH%: 18.6 % (ref 14.0–49.7)
MCH: 32.4 pg (ref 25.1–34.0)
MCHC: 33.4 g/dL (ref 31.5–36.0)
MCV: 97 fL (ref 79.5–101.0)
MONO#: 0.5 10*3/uL (ref 0.1–0.9)
MONO%: 9.7 % (ref 0.0–14.0)
NEUT%: 65.4 % (ref 38.4–76.8)
NEUTROS ABS: 3.5 10*3/uL (ref 1.5–6.5)
PLATELETS: 252 10*3/uL (ref 145–400)
RBC: 4.38 10*6/uL (ref 3.70–5.45)
RDW: 12.7 % (ref 11.2–14.5)
WBC: 5.3 10*3/uL (ref 3.9–10.3)

## 2015-12-29 LAB — COMPREHENSIVE METABOLIC PANEL
ALBUMIN: 4.1 g/dL (ref 3.5–5.0)
ALT: 10 U/L (ref 0–55)
ANION GAP: 8 meq/L (ref 3–11)
AST: 17 U/L (ref 5–34)
Alkaline Phosphatase: 121 U/L (ref 40–150)
BILIRUBIN TOTAL: 0.51 mg/dL (ref 0.20–1.20)
BUN: 13.7 mg/dL (ref 7.0–26.0)
CALCIUM: 9.5 mg/dL (ref 8.4–10.4)
CHLORIDE: 104 meq/L (ref 98–109)
CO2: 29 mEq/L (ref 22–29)
CREATININE: 0.9 mg/dL (ref 0.6–1.1)
EGFR: 65 mL/min/{1.73_m2} — ABNORMAL LOW (ref >90)
Glucose: 87 mg/dl (ref 70–140)
Potassium: 4.3 mEq/L (ref 3.5–5.1)
Sodium: 141 mEq/L (ref 136–145)
TOTAL PROTEIN: 6.7 g/dL (ref 6.4–8.3)

## 2015-12-29 LAB — LACTATE DEHYDROGENASE: LDH: 259 U/L — AB (ref 125–245)

## 2015-12-29 MED ORDER — FUROSEMIDE 20 MG PO TABS: ORAL_TABLET | ORAL | Status: DC

## 2015-12-29 NOTE — Progress Notes (Signed)
[place] OFFICE PROGRESS NOTE  Patient Care Team: [name], NP as PCP - General (Family Medicine)  SUMMARY OF ONCOLOGIC HISTORY: Oncology History   --Early 2000.  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 She was treated with chlorambucil in 2000.   --2007 - 09/2008.  Treated with single agent Rituxan.   --12/2008.  She had progressive adenopathy in chest and abdomen and repeat biopsy showed low grade follicular and diffuse B cell NHL.   --01/2009 - 07/2009. She required left ureteral stent and was treated with Treanda/Rituxan x 5 cycles from 02-2009 thru 06-2009.   --06/2009.  On maintenance Rituxan every 3 months without difficulty.  --04/02/2013.    CT CAP demonstrated  stable disease No evidence of lymphoma recurrence within the abdomen or pelvis but new mild right axillary lymphadenopathy without mediastinal adenopathy     INTERVAL HISTORY: Please see below for problem oriented charting. She feels well. Skin rash had resolved. Denies new lymphadenopathy. No recent infection. The patient continues to smoke  REVIEW OF SYSTEMS:   Constitutional: Denies fevers, chills or abnormal weight loss Eyes: Denies blurriness of vision Ears, nose, mouth, throat, and face: Denies mucositis or sore throat Respiratory: Denies cough, dyspnea or wheezes Cardiovascular: Denies palpitation, chest discomfort or lower extremity swelling Gastrointestinal:  Denies nausea, heartburn or change in bowel habits Skin: Denies abnormal skin rashes Lymphatics: Denies new lymphadenopathy or easy bruising Neurological:Denies numbness, tingling or new weaknesses Behavioral/Psych: Mood is stable, no new changes  All other systems were reviewed with the patient and are negative.  I have reviewed the past medical history, past surgical history, social history and family history with the  patient and they are unchanged from previous note.  ALLERGIES:  is allergic to contrast media; iohexol; povidone-iodine; and penicillins.  MEDICATIONS:  Current Outpatient Prescriptions  Medication Sig Dispense Refill  . aspirin 81 MG tablet Take 81 mg by mouth daily.    . Cholecalciferol 2000 UNITS TABS Take 1 tablet by mouth daily.      . citalopram (CELEXA) 40 MG tablet TAKE ONE TABLET BY MOUTH EVERY DAY 90 tablet 0  . furosemide (LASIX) 20 MG tablet Take one tablet by mouth every 3 days as needed for swelling. 90 tablet 3   No current facility-administered medications for this visit.    PHYSICAL EXAMINATION: ECOG PERFORMANCE STATUS: 0 - Asymptomatic  Filed Vitals:   12/29/15 0905  BP: 127/62  Pulse: 78  Temp: 97.7 F (36.5 C)  Resp: 18   Filed Weights   12/29/15 0905  Weight: 130 lb 1.6 oz (59.013 kg)    GENERAL:alert, no distress and comfortable SKIN: skin color, texture, turgor are normal, no rashes or significant lesions EYES: normal, Conjunctiva are pink and non-injected, sclera clear OROPHARYNX:no exudate, no erythema and lips, buccal mucosa, and tongue normal  NECK: supple, thyroid normal size, non-tender, without nodularity LYMPH:  no palpable lymphadenopathy in the cervical, axillary or inguinal LUNGS: clear to auscultation and percussion with normal breathing effort HEART: regular rate & rhythm and no murmurs and no lower extremity edema ABDOMEN:abdomen soft, non-tender and normal bowel sounds Musculoskeletal:no cyanosis of digits and no clubbing  NEURO: alert & oriented x 3 with fluent speech, no focal motor/sensory deficits  LABORATORY DATA:  I have reviewed the data as listed    Component Value Date/Time   NA 141 12/29/2015 0851   NA 142 03/17/2012 0929   K  4.3 12/29/2015 0851   K 3.6 03/17/2012 0929   CL 101 03/28/2013 0826   CL 104 03/17/2012 0929   CO2 29 12/29/2015 0851   CO2 29 03/17/2012 0929   GLUCOSE 87 12/29/2015 0851   GLUCOSE 84  03/28/2013 0826   GLUCOSE 84 03/17/2012 0929   BUN 13.7 12/29/2015 0851   BUN 11 03/17/2012 0929   CREATININE 0.9 12/29/2015 0851   CREATININE 0.76 03/17/2012 0929   CALCIUM 9.5 12/29/2015 0851   CALCIUM 9.6 03/17/2012 0929   PROT 6.7 12/29/2015 0851   PROT 6.2 03/17/2012 0929   ALBUMIN 4.1 12/29/2015 0851   ALBUMIN 4.4 03/17/2012 0929   AST 17 12/29/2015 0851   AST 18 03/17/2012 0929   ALT 10 12/29/2015 0851   ALT 9 03/17/2012 0929   ALKPHOS 121 12/29/2015 0851   ALKPHOS 123* 03/17/2012 0929   BILITOT 0.51 12/29/2015 0851   BILITOT 0.5 03/17/2012 0929   GFRNONAA 80.06 05/22/2010 1106   GFRAA  03/27/2009 1035    >60        The eGFR has been calculated using the MDRD equation. This calculation has not been validated in all clinical situations. eGFR's persistently <60 mL/min signify possible Chronic Kidney Disease.    No results found for: SPEP, UPEP  Lab Results  Component Value Date   WBC 5.3 12/29/2015   NEUTROABS 3.5 12/29/2015   HGB 14.2 12/29/2015   HCT 42.5 12/29/2015   MCV 97.0 12/29/2015   PLT 252 12/29/2015      Chemistry      Component Value Date/Time   NA 141 12/29/2015 0851   NA 142 03/17/2012 0929   K 4.3 12/29/2015 0851   K 3.6 03/17/2012 0929   CL 101 03/28/2013 0826   CL 104 03/17/2012 0929   CO2 29 12/29/2015 0851   CO2 29 03/17/2012 0929   BUN 13.7 12/29/2015 0851   BUN 11 03/17/2012 0929   CREATININE 0.9 12/29/2015 0851   CREATININE 0.76 03/17/2012 0929      Component Value Date/Time   CALCIUM 9.5 12/29/2015 0851   CALCIUM 9.6 03/17/2012 0929   ALKPHOS 121 12/29/2015 0851   ALKPHOS 123* 03/17/2012 0929   AST 17 12/29/2015 0851   AST 18 03/17/2012 0929   ALT 10 12/29/2015 0851   ALT 9 03/17/2012 0929   BILITOT 0.51 12/29/2015 0851   BILITOT 0.5 03/17/2012 0929    ASSESSMENT & PLAN:  History of B-cell lymphoma Currently, she has no signs of disease. The patient is a long-term cancer survivor and currently is 5 years out from  last recurrence. There is no data beyond 2 years to use maintenance rituximab. Continue supportive care. I will see her 1 year from now with history, physical examination and blood work only  Tobacco abuse I spent some time counseling the patient the importance of tobacco cessation. She is attempting to quit I recommend aspirin therapy to prevent risk of heart disease.     Orders Placed This Encounter  Procedures  . Comprehensive metabolic panel    Standing Status: Future     Number of Occurrences:      Standing Expiration Date: 02/01/2017  . CBC with Differential/Platelet    Standing Status: Future     Number of Occurrences:      Standing Expiration Date: 02/01/2017  . Lactate dehydrogenase (LDH)    Standing Status: Future     Number of Occurrences:      Standing Expiration Date: 12/28/2016   All  questions were answered. The patient knows to call the clinic with any problems, questions or concerns. No barriers to learning was detected. I spent 15 minutes counseling the patient face to face. The total time spent in the appointment was 20 minutes and more than 50% was on counseling and review of test results     [facility], [place], MD 12/29/2015 2:35 PM

## 2015-12-29 NOTE — Assessment & Plan Note (Signed)
I spent some time counseling the patient the importance of tobacco cessation. She is attempting to quit I recommend aspirin therapy to prevent risk of heart disease.

## 2015-12-29 NOTE — Assessment & Plan Note (Signed)
Currently, she has no signs of disease. The patient is a long-term cancer survivor and currently is 5 years out from last recurrence. There is no data beyond 2 years to use maintenance rituximab. Continue supportive care. I will see her 1 year from now with history, physical examination and blood work only 

## 2015-12-29 NOTE — Telephone Encounter (Signed)
per pfo to sch pt appt-gave pt copy of avs °

## 2016-01-27 ENCOUNTER — Ambulatory Visit (INDEPENDENT_AMBULATORY_CARE_PROVIDER_SITE_OTHER): Payer: Medicare Other | Admitting: Adult Health

## 2016-01-27 ENCOUNTER — Encounter: Payer: Self-pay | Admitting: Adult Health

## 2016-01-27 VITALS — BP 126/80 | Temp 98.1°F | Wt 125.5 lb

## 2016-01-27 DIAGNOSIS — J069 Acute upper respiratory infection, unspecified: Secondary | ICD-10-CM

## 2016-01-27 DIAGNOSIS — Z760 Encounter for issue of repeat prescription: Secondary | ICD-10-CM | POA: Diagnosis not present

## 2016-01-27 MED ORDER — DOXYCYCLINE HYCLATE 100 MG PO CAPS: 100.0000 mg | ORAL_CAPSULE | Freq: Two times a day (BID) | ORAL | Status: DC

## 2016-01-27 MED ORDER — CITALOPRAM HYDROBROMIDE 40 MG PO TABS: 40.0000 mg | ORAL_TABLET | Freq: Every day | ORAL | Status: DC

## 2016-01-27 NOTE — Progress Notes (Signed)
Subjective:    [name], female    DOB: [birth year], 75 y.o.   MRN: [id number]  URI  The current episode started in the past 7 days. The problem has been unchanged. There has been no fever. Associated symptoms include congestion, coughing (productive ), rhinorrhea and sinus pain. Pertinent negatives include no chest pain, headaches, nausea, neck pain, sore throat, vomiting or wheezing. She has tried decongestant for the symptoms. The treatment provided mild relief.      Review of Systems  Constitutional: Positive for fatigue. Negative for fever, chills and appetite change.  HENT: Positive for congestion, postnasal drip, rhinorrhea and sinus pressure. Negative for sore throat.   Respiratory: Positive for cough (productive ). Negative for wheezing.   Cardiovascular: Negative for chest pain.  Gastrointestinal: Negative for nausea and vomiting.  Musculoskeletal: Negative for neck pain.  Neurological: Negative for dizziness, weakness and headaches.  Psychiatric/Behavioral: Negative.    Past Medical History  Diagnosis Date  . 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 Marital Status: Married    Spouse Name: N/A  . Number of Children: N/A  . Years of Education: N/A   Occupational History  . Not on file.   Social History Main Topics  . Smoking status: Current Every Day Smoker -- 0.05 packs/day for 51 years    Types: Cigarettes  . Smokeless tobacco: Never Used     Comment: 4-5 cigarettes/ day   . Alcohol Use: 0.0 oz/week    0 Standard drinks or equivalent per week     Comment: rare  . Drug Use: No  . Sexual Activity: Not on file   Other Topics Concern  . 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       Pets: Dog      Diet: Healthy diet, fruits, veggies and fish.    Exercise: None           Past Surgical History  Procedure Laterality Date  . Right oophorectomy  1976  . 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 Cancer Mother     colon ca    Allergies  Allergen Reactions  . Contrast Media [Iodinated Diagnostic Agents] Shortness Of Breath, Rash and Other (See Comments)    Throat swells  . Iohexol Shortness Of Breath  . Povidone-Iodine Hives, Shortness Of Breath and Rash    REACTION: throat swells  . Penicillins Hives, Itching and Rash    Current Outpatient Prescriptions on File Prior to Visit  Medication Sig Dispense Refill  . aspirin 81 MG tablet Take 81 mg by mouth daily.    . Cholecalciferol 2000 UNITS TABS Take 1 tablet by mouth daily.      . furosemide (LASIX) 20 MG tablet Take one tablet by mouth every 3 days as needed for swelling. 90 tablet 3   No current facility-administered medications on file prior to visit.    BP 126/80 mmHg  Temp(Src) 98.1 F (36.7  C) (Oral)  Wt 125 lb 8 oz (56.926 kg)       Objective:   Physical Exam  Constitutional: She is oriented to person, place, and time. She appears well-developed and well-nourished. No distress.  She appears tired but not toxic   Cardiovascular: Normal rate, regular rhythm, normal heart sounds and intact distal pulses.  Exam reveals no gallop and no friction rub.   No murmur heard. Pulmonary/Chest: Effort normal and breath sounds normal. No respiratory distress. She has no wheezes. She has no rales. She exhibits no tenderness.  Neurological: She is alert and oriented to person, place, and time.  Skin: Skin is warm and dry. No rash noted. She is not diaphoretic. No erythema. No pallor.  Psychiatric: She has a normal mood and affect. Her behavior is normal. Judgment  and thought content normal.  Nursing note and vitals reviewed.      Assessment & Plan:  1. URI (upper respiratory infection) - doxycycline (VIBRAMYCIN) 100 MG capsule; Take 1 capsule (100 mg total) by mouth 2 (two) times daily.  Dispense: 14 capsule; Refill: 0 - Mucinex DM - Stay well hydrated  - Follow up if no improvement  2. Medication refill - citalopram (CELEXA) 40 MG tablet; Take 1 tablet (40 mg total) by mouth daily.  Dispense: 90 tablet; Refill: 0 - She needs a physical for any additional medication refills.

## 2016-01-27 NOTE — Patient Instructions (Addendum)
It was great seeing you again!  I have sent in a prescription for Doxycycline, take this twice a day for 7 days.    Use Mucinex DM every 12 hours and take with a lot of fluid.   Follow up with me for your physical.    Upper Respiratory Infection, Adult Most upper respiratory infections (URIs) are a viral infection of the air passages leading to the lungs. A URI affects the nose, throat, and upper air passages. The most common type of URI is nasopharyngitis and is typically referred to as "the common cold." URIs run their course and usually go away on their own. Most of the time, a URI does not require medical attention, but sometimes a bacterial infection in the upper airways can follow a viral infection. This is called a secondary infection. Sinus and middle ear infections are common types of secondary upper respiratory infections. Bacterial pneumonia can also complicate a URI. A URI can worsen asthma and chronic obstructive pulmonary disease (COPD). Sometimes, these complications can require emergency medical care and may be life threatening.  CAUSES Almost all URIs are caused by viruses. A virus is a type of germ and can spread from one person to another.  RISKS FACTORS You may be at risk for a URI if:   You smoke.   You have chronic heart or lung disease.  You have a weakened defense (immune) system.   You are very young or very old.   You have nasal allergies or asthma.  You work in crowded or poorly ventilated areas.  You work in health care facilities or schools. SIGNS AND SYMPTOMS  Symptoms typically develop 2-3 days after you come in contact with a cold virus. Most viral URIs last 7-10 days. However, viral URIs from the influenza virus (flu virus) can last 14-18 days and are typically more severe. Symptoms may include:   Runny or stuffy (congested) nose.   Sneezing.   Cough.   Sore throat.   Headache.   Fatigue.   Fever.   Loss of appetite.   Pain  in your forehead, behind your eyes, and over your cheekbones (sinus pain).  Muscle aches.  DIAGNOSIS  Your health care provider may diagnose a URI by:  Physical exam.  Tests to check that your symptoms are not due to another condition such as:  Strep throat.  Sinusitis.  Pneumonia.  Asthma. TREATMENT  A URI goes away on its own with time. It cannot be cured with medicines, but medicines may be prescribed or recommended to relieve symptoms. Medicines may help:  Reduce your fever.  Reduce your cough.  Relieve nasal congestion. HOME CARE INSTRUCTIONS   Take medicines only as directed by your health care provider.   Gargle warm saltwater or take cough drops to comfort your throat as directed by your health care provider.  Use a warm mist humidifier or inhale steam from a shower to increase air moisture. This may make it easier to breathe.  Drink enough fluid to keep your urine clear or pale yellow.   Eat soups and other clear broths and maintain good nutrition.   Rest as needed.   Return to work when your temperature has returned to normal or as your health care provider advises. You may need to stay home longer to avoid infecting others. You can also use a face mask and careful hand washing to prevent spread of the virus.  Increase the usage of your inhaler if you have asthma.  Do not use any tobacco products, including cigarettes, chewing tobacco, or electronic cigarettes. If you need help quitting, ask your health care provider. PREVENTION  The best way to protect yourself from getting a cold is to practice good hygiene.   Avoid oral or hand contact with people with cold symptoms.   Wash your hands often if contact occurs.  There is no clear evidence that vitamin C, vitamin E, echinacea, or exercise reduces the chance of developing a cold. However, it is always recommended to get plenty of rest, exercise, and practice good nutrition.  SEEK MEDICAL CARE IF:    You are getting worse rather than better.   Your symptoms are not controlled by medicine.   You have chills.  You have worsening shortness of breath.  You have brown or red mucus.  You have yellow or brown nasal discharge.  You have pain in your face, especially when you bend forward.  You have a fever.  You have swollen neck glands.  You have pain while swallowing.  You have white areas in the back of your throat. SEEK IMMEDIATE MEDICAL CARE IF:   You have severe or persistent:  Headache.  Ear pain.  Sinus pain.  Chest pain.  You have chronic lung disease and any of the following:  Wheezing.  Prolonged cough.  Coughing up blood.  A change in your usual mucus.  You have a stiff neck.  You have changes in your:  Vision.  Hearing.  Thinking.  Mood. MAKE SURE YOU:   Understand these instructions.  Will watch your condition.  Will get help right away if you are not doing well or get worse.   This information is not intended to replace advice given to you by your health care provider. Make sure you discuss any questions you have with your health care provider.   Document Released: 03/23/2001 Document Revised: 02/11/2015 Document Reviewed: 01/02/2014 Elsevier Interactive Patient Education [employer].

## 2016-02-16 DIAGNOSIS — Z961 Presence of intraocular lens: Secondary | ICD-10-CM | POA: Diagnosis not present

## 2016-02-16 DIAGNOSIS — Z0100 Encounter for examination of eyes and vision without abnormal findings: Secondary | ICD-10-CM | POA: Diagnosis not present

## 2016-04-30 ENCOUNTER — Ambulatory Visit (INDEPENDENT_AMBULATORY_CARE_PROVIDER_SITE_OTHER): Payer: Medicare Other | Admitting: Adult Health

## 2016-04-30 ENCOUNTER — Encounter: Payer: Self-pay | Admitting: Adult Health

## 2016-04-30 VITALS — Temp 98.2°F | Ht 63.0 in | Wt 128.2 lb

## 2016-04-30 DIAGNOSIS — Z0000 Encounter for general adult medical examination without abnormal findings: Secondary | ICD-10-CM | POA: Diagnosis not present

## 2016-04-30 DIAGNOSIS — Z760 Encounter for issue of repeat prescription: Secondary | ICD-10-CM | POA: Diagnosis not present

## 2016-04-30 DIAGNOSIS — E2839 Other primary ovarian failure: Secondary | ICD-10-CM

## 2016-04-30 DIAGNOSIS — F329 Major depressive disorder, single episode, unspecified: Secondary | ICD-10-CM

## 2016-04-30 DIAGNOSIS — F32A Depression, unspecified: Secondary | ICD-10-CM

## 2016-04-30 LAB — POC URINALSYSI DIPSTICK (AUTOMATED)
Bilirubin, UA: NEGATIVE
Glucose, UA: NEGATIVE
Ketones, UA: NEGATIVE
Leukocytes, UA: NEGATIVE
Nitrite, UA: NEGATIVE
PROTEIN UA: NEGATIVE
SPEC GRAV UA: 1.025
Urobilinogen, UA: 0.2
pH, UA: 5

## 2016-04-30 LAB — CBC WITH DIFFERENTIAL/PLATELET
BASOS ABS: 0.1 10*3/uL (ref 0.0–0.1)
Basophils Relative: 1.1 % (ref 0.0–3.0)
Eosinophils Absolute: 0.5 10*3/uL (ref 0.0–0.7)
Eosinophils Relative: 8.5 % — ABNORMAL HIGH (ref 0.0–5.0)
HEMATOCRIT: 38.8 % (ref 36.0–46.0)
HEMOGLOBIN: 13.3 g/dL (ref 12.0–15.0)
Lymphocytes Relative: 15.8 % (ref 12.0–46.0)
Lymphs Abs: 0.9 10*3/uL (ref 0.7–4.0)
MCHC: 34.4 g/dL (ref 30.0–36.0)
MCV: 93.8 fl (ref 78.0–100.0)
MONOS PCT: 12.6 % — AB (ref 3.0–12.0)
Monocytes Absolute: 0.7 10*3/uL (ref 0.1–1.0)
Neutro Abs: 3.4 10*3/uL (ref 1.4–7.7)
Neutrophils Relative %: 62 % (ref 43.0–77.0)
Platelets: 286 10*3/uL (ref 150.0–400.0)
RBC: 4.13 Mil/uL (ref 3.87–5.11)
RDW: 13 % (ref 11.5–15.5)
WBC: 5.5 10*3/uL (ref 4.0–10.5)

## 2016-04-30 LAB — BASIC METABOLIC PANEL
BUN: 24 mg/dL — ABNORMAL HIGH (ref 6–23)
CALCIUM: 10.1 mg/dL (ref 8.4–10.5)
CO2: 29 mEq/L (ref 19–32)
Chloride: 104 mEq/L (ref 96–112)
Creatinine, Ser: 0.95 mg/dL (ref 0.40–1.20)
GFR: 60.87 mL/min (ref >60.00)
Glucose, Bld: 86 mg/dL (ref 70–99)
Potassium: 3.9 mEq/L (ref 3.5–5.1)
SODIUM: 143 meq/L (ref 135–145)

## 2016-04-30 LAB — LIPID PANEL
Cholesterol: 234 mg/dL — ABNORMAL HIGH (ref 0–200)
HDL: 55.6 mg/dL (ref >39.00)
LDL Cholesterol: 161 mg/dL — ABNORMAL HIGH (ref 0–99)
NONHDL: 178.53
Total CHOL/HDL Ratio: 4
Triglycerides: 89 mg/dL (ref 0.0–149.0)
VLDL: 17.8 mg/dL (ref 0.0–40.0)

## 2016-04-30 LAB — HEPATIC FUNCTION PANEL
ALBUMIN: 4.4 g/dL (ref 3.5–5.2)
ALK PHOS: 131 U/L — AB (ref 39–117)
ALT: 8 U/L (ref 0–35)
AST: 15 U/L (ref 0–37)
Bilirubin, Direct: 0.1 mg/dL (ref 0.0–0.3)
TOTAL PROTEIN: 6.4 g/dL (ref 6.0–8.3)
Total Bilirubin: 0.5 mg/dL (ref 0.2–1.2)

## 2016-04-30 LAB — TSH: TSH: 2.77 u[IU]/mL (ref 0.35–4.50)

## 2016-04-30 MED ORDER — CITALOPRAM HYDROBROMIDE 40 MG PO TABS: 40.0000 mg | ORAL_TABLET | Freq: Every day | ORAL | Status: DC

## 2016-04-30 NOTE — Progress Notes (Signed)
Subjective:    [name], female    DOB: 07/01/1941, 75 y.o.   MRN: [id number]  HPI  Patient presents for yearly preventative medicine examination. She is a pleasant, active and healthy 75 year old female who  has a past medical history of Cancer ([place]) ( APRIL OF 2000).  All immunizations and health maintenance protocols were reviewed with the patient and needed orders were placed.  Appropriate screening laboratory values were ordered for the patient including screening of hyperlipidemia, renal function and hepatic function.   Medication reconciliation,  past medical history, social history, problem list and allergies were reviewed in detail with the patient  Goals were established with regard to weight loss, exercise, and  diet in compliance with medications  End of life planning was discussed.  She does home breast exams and is up to date on her dental and eye exams for the year.   She reports that she has quit smoking and has been smoke free for over a month. She is feeling a lot better and is having minimal cravings.   She feels as though her depression is well controlled on Celexa and she needs a refill.   Review of Systems  Constitutional: Negative.   Eyes: Negative.   Respiratory: Negative.   Cardiovascular: Negative.   Gastrointestinal: Negative.   Endocrine: Negative.   Genitourinary: Negative.   Musculoskeletal: Negative.   Skin: Negative.   Allergic/Immunologic: Negative.   Neurological: Negative.   Hematological: Negative.   Psychiatric/Behavioral: Negative.   All other systems reviewed and are negative.  Past Medical History  Diagnosis Date  . 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 Marital Status: Married    Spouse Name: N/A  . Number of Children: N/A  . Years of Education: N/A   Occupational History  . Not on file.   Social History Main Topics  . Smoking  status: Former Smoker -- 0.05 packs/day for 51 years    Types: Cigarettes  . Smokeless tobacco: Never Used     Comment: Quit June 1  . Alcohol Use: 0.0 oz/week    0 Standard drinks or equivalent per week     Comment: rare  . Drug Use: No  . Sexual Activity: Not on file   Other Topics Concern  . 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       Pets: Dog      Diet: Healthy diet, fruits, veggies and fish.    Exercise: None           Past Surgical History  Procedure Laterality Date  . Right oophorectomy  1976  . 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 Cancer Mother     colon ca    Allergies  Allergen Reactions  . Contrast Media [Iodinated Diagnostic Agents] Shortness Of Breath, Rash and Other (See Comments)    Throat swells  . Iohexol Shortness Of Breath  . Povidone-Iodine Hives, Shortness Of  Breath and Rash    REACTION: throat swells  . Penicillins Hives, Itching and Rash    Current Outpatient Prescriptions on File Prior to Visit  Medication Sig Dispense Refill  . aspirin 81 MG tablet Take 81 mg by mouth daily.    . Cholecalciferol 2000 UNITS TABS Take 1 tablet by mouth daily.      . furosemide (LASIX) 20 MG tablet Take one tablet by mouth every 3 days as needed for swelling. 90 tablet 3   No current facility-administered medications on file prior to visit.    Temp(Src) 98.2 F (36.8 C) (Oral)  Ht 5\' 3"  (1.6 m)  Wt 128 lb 3.2 oz (58.151 kg)  BMI 22.72 kg/m2       Objective:   Physical Exam  Constitutional: She is oriented to person, place, and time. She appears well-developed and well-nourished. No distress.  HENT:  Head:  Normocephalic and atraumatic.  Right Ear: External ear normal.  Left Ear: External ear normal.  Nose: Nose normal.  Mouth/Throat: Oropharynx is clear and moist. No oropharyngeal exudate.  Eyes: Conjunctivae and EOM are normal. Pupils are equal, round, and reactive to light. Right eye exhibits no discharge. Left eye exhibits no discharge. No scleral icterus.  Neck: Normal range of motion. Neck supple. No tracheal deviation present. No thyromegaly present.  Cardiovascular: Normal rate, regular rhythm, normal heart sounds and intact distal pulses.  Exam reveals no gallop and no friction rub.   No murmur heard. Pulmonary/Chest: Effort normal and breath sounds normal. No respiratory distress. She has no wheezes. She has no rales. She exhibits no tenderness.  Abdominal: Soft. Bowel sounds are normal. She exhibits no distension and no mass. There is no tenderness. There is no rebound and no guarding.  Genitourinary:  Breast Exam: No masses, lumps, asymmetry, or discharge  Musculoskeletal: Normal range of motion. She exhibits no edema or tenderness.  Lymphadenopathy:    She has no cervical adenopathy.  Neurological: She is alert and oriented to person, place, and time. She has normal reflexes. She displays normal reflexes. No cranial nerve deficit. She exhibits normal muscle tone. Coordination normal.  Skin: Skin is warm and dry. No rash noted. She is not diaphoretic. No erythema. No pallor.  Psychiatric: She has a normal mood and affect. Her behavior is normal. Judgment and thought content normal.  Nursing note and vitals reviewed.      Assessment & Plan:   1. Routine general medical examination at a health care facility - Healthy female - Benign exam  - POCT Urinalysis Dipstick (Automated) - Basic metabolic panel - CBC with Differential/Platelet - Hepatic function panel - Lipid panel - TSH - DG Bone Density; Future - Follow up in one year or sooner of needed - Continue to exercise and  eat healthy   2. Estrogen deficiency  - DG Bone Density; Future  3. Medication refill  - citalopram (CELEXA) 40 MG tablet; Take 1 tablet (40 mg total) by mouth daily.  Dispense: 90 tablet; Refill: 1  4. Depression - Well controlled. No change - citalopram (CELEXA) 40 MG tablet; Take 1 tablet (40 mg total) by mouth daily.  Dispense: 90 tablet; Refill: 1   [name], NP

## 2016-04-30 NOTE — Patient Instructions (Addendum)
It was great seeing you again!  Your exam was great! I will follow up with you regarding your blood work.   Please follow up with me in one year or sooner if needed  Keep doing what you are doing!     Health Maintenance, Female Adopting a healthy lifestyle and getting preventive care can go a long way to promote health and wellness. Talk with your health care provider about what schedule of regular examinations is right for you. This is a good chance for you to check in with your provider about disease prevention and staying healthy. In between checkups, there are plenty of things you can do on your own. Experts have done a lot of research about which lifestyle changes and preventive measures are most likely to keep you healthy. Ask your health care provider for more information. WEIGHT AND DIET  Eat a healthy diet  Be sure to include plenty of vegetables, fruits, low-fat dairy products, and lean protein.  Do not eat a lot of foods high in solid fats, added sugars, or salt.  Get regular exercise. This is one of the most important things you can do for your health.  Most adults should exercise for at least 150 minutes each week. The exercise should increase your heart rate and make you sweat (moderate-intensity exercise).  Most adults should also do strengthening exercises at least twice a week. This is in addition to the moderate-intensity exercise.  Maintain a healthy weight  Body mass index (BMI) is a measurement that can be used to identify possible weight problems. It estimates body fat based on height and weight. Your health care provider can help determine your BMI and help you achieve or maintain a healthy weight.  For females 69 years of age and older:   A BMI below 18.5 is considered underweight.  A BMI of 18.5 to 24.9 is normal.  A BMI of 25 to 29.9 is considered overweight.  A BMI of 30 and above is considered obese.  Watch levels of cholesterol and blood  lipids  You should start having your blood tested for lipids and cholesterol at 75 years of age, then have this test every 5 years.  You may need to have your cholesterol levels checked more often if:  Your lipid or cholesterol levels are high.  You are older than 75 years of age.  You are at high risk for heart disease.  CANCER SCREENING   Lung Cancer  Lung cancer screening is recommended for adults 83-42 years old who are at high risk for lung cancer because of a history of smoking.  A yearly low-dose CT scan of the lungs is recommended for people who:  Currently smoke.  Have quit within the past 15 years.  Have at least a 30-pack-year history of smoking. A pack year is smoking an average of one pack of cigarettes a day for 1 year.  Yearly screening should continue until it has been 15 years since you quit.  Yearly screening should stop if you develop a health problem that would prevent you from having lung cancer treatment.  Breast Cancer  Practice breast self-awareness. This means understanding how your breasts normally appear and feel.  It also means doing regular breast self-exams. Let your health care provider know about any changes, no matter how small.  If you are in your 20s or 30s, you should have a clinical breast exam (CBE) by a health care provider every 1-3 years as part of a  regular health exam.  If you are 40 or older, have a CBE every year. Also consider having a breast X-ray (mammogram) every year.  If you have a family history of breast cancer, talk to your health care provider about genetic screening.  If you are at high risk for breast cancer, talk to your health care provider about having an MRI and a mammogram every year.  Breast cancer gene (BRCA) assessment is recommended for women who have family members with BRCA-related cancers. BRCA-related cancers include:  Breast.  Ovarian.  Tubal.  Peritoneal cancers.  Results of the assessment  will determine the need for genetic counseling and BRCA1 and BRCA2 testing. Cervical Cancer Your health care provider may recommend that you be screened regularly for cancer of the pelvic organs (ovaries, uterus, and vagina). This screening involves a pelvic examination, including checking for microscopic changes to the surface of your cervix (Pap test). You may be encouraged to have this screening done every 3 years, beginning at age 84.  For women ages 99-65, health care providers may recommend pelvic exams and Pap testing every 3 years, or they may recommend the Pap and pelvic exam, combined with testing for human papilloma virus (HPV), every 5 years. Some types of HPV increase your risk of cervical cancer. Testing for HPV may also be done on women of any age with unclear Pap test results.  Other health care providers may not recommend any screening for nonpregnant women who are considered low risk for pelvic cancer and who do not have symptoms. Ask your health care provider if a screening pelvic exam is right for you.  If you have had past treatment for cervical cancer or a condition that could lead to cancer, you need Pap tests and screening for cancer for at least 20 years after your treatment. If Pap tests have been discontinued, your risk factors (such as having a new sexual partner) need to be reassessed to determine if screening should resume. Some women have medical problems that increase the chance of getting cervical cancer. In these cases, your health care provider may recommend more frequent screening and Pap tests. Colorectal Cancer  This type of cancer can be detected and often prevented.  Routine colorectal cancer screening usually begins at 75 years of age and continues through 75 years of age.  Your health care provider may recommend screening at an earlier age if you have risk factors for colon cancer.  Your health care provider may also recommend using home test kits to check  for hidden blood in the stool.  A small camera at the end of a tube can be used to examine your colon directly (sigmoidoscopy or colonoscopy). This is done to check for the earliest forms of colorectal cancer.  Routine screening usually begins at age 6.  Direct examination of the colon should be repeated every 5-10 years through 75 years of age. However, you may need to be screened more often if early forms of precancerous polyps or small growths are found. Skin Cancer  Check your skin from head to toe regularly.  Tell your health care provider about any new moles or changes in moles, especially if there is a change in a mole's shape or color.  Also tell your health care provider if you have a mole that is larger than the size of a pencil eraser.  Always use sunscreen. Apply sunscreen liberally and repeatedly throughout the day.  Protect yourself by wearing long sleeves, pants, a wide-brimmed hat,  and sunglasses whenever you are outside. HEART DISEASE, DIABETES, AND HIGH BLOOD PRESSURE   High blood pressure causes heart disease and increases the risk of stroke. High blood pressure is more likely to develop in:  People who have blood pressure in the high end of the normal range (130-139/85-89 mm Hg).  People who are overweight or obese.  People who are African American.  If you are 39-52 years of age, have your blood pressure checked every 3-5 years. If you are 19 years of age or older, have your blood pressure checked every year. You should have your blood pressure measured twice--once when you are at a hospital or clinic, and once when you are not at a hospital or clinic. Record the average of the two measurements. To check your blood pressure when you are not at a hospital or clinic, you can use:  An automated blood pressure machine at a pharmacy.  A home blood pressure monitor.  If you are between 31 years and 2 years old, ask your health care provider if you should take  aspirin to prevent strokes.  Have regular diabetes screenings. This involves taking a blood sample to check your fasting blood sugar level.  If you are at a normal weight and have a low risk for diabetes, have this test once every three years after 75 years of age.  If you are overweight and have a high risk for diabetes, consider being tested at a younger age or more often. PREVENTING INFECTION  Hepatitis B  If you have a higher risk for hepatitis B, you should be screened for this virus. You are considered at high risk for hepatitis B if:  You were born in a country where hepatitis B is common. Ask your health care provider which countries are considered high risk.  Your parents were born in a high-risk country, and you have not been immunized against hepatitis B (hepatitis B vaccine).  You have HIV or AIDS.  You use needles to inject street drugs.  You live with someone who has hepatitis B.  You have had sex with someone who has hepatitis B.  You get hemodialysis treatment.  You take certain medicines for conditions, including cancer, organ transplantation, and autoimmune conditions. Hepatitis C  Blood testing is recommended for:  Everyone born from [birth year] through 1965.  Anyone with known risk factors for hepatitis C. Sexually transmitted infections (STIs)  You should be screened for sexually transmitted infections (STIs) including gonorrhea and chlamydia if:  You are sexually active and are younger than 75 years of age.  You are older than 75 years of age and your health care provider tells you that you are at risk for this type of infection.  Your sexual activity has changed since you were last screened and you are at an increased risk for chlamydia or gonorrhea. Ask your health care provider if you are at risk.  If you do not have HIV, but are at risk, it may be recommended that you take a prescription medicine daily to prevent HIV infection. This is called  pre-exposure prophylaxis (PrEP). You are considered at risk if:  You are sexually active and do not regularly use condoms or know the HIV status of your partner(s).  You take drugs by injection.  You are sexually active with a partner who has HIV. Talk with your health care provider about whether you are at high risk of being infected with HIV. If you choose to begin PrEP, you should  first be tested for HIV. You should then be tested every 3 months for as long as you are taking PrEP.  PREGNANCY   If you are premenopausal and you may become pregnant, ask your health care provider about preconception counseling.  If you may become pregnant, take 400 to 800 micrograms (mcg) of folic acid every day.  If you want to prevent pregnancy, talk to your health care provider about birth control (contraception). OSTEOPOROSIS AND MENOPAUSE   Osteoporosis is a disease in which the bones lose minerals and strength with aging. This can result in serious bone fractures. Your risk for osteoporosis can be identified using a bone density scan.  If you are 2 years of age or older, or if you are at risk for osteoporosis and fractures, ask your health care provider if you should be screened.  Ask your health care provider whether you should take a calcium or vitamin D supplement to lower your risk for osteoporosis.  Menopause may have certain physical symptoms and risks.  Hormone replacement therapy may reduce some of these symptoms and risks. Talk to your health care provider about whether hormone replacement therapy is right for you.  HOME CARE INSTRUCTIONS   Schedule regular health, dental, and eye exams.  Stay current with your immunizations.   Do not use any tobacco products including cigarettes, chewing tobacco, or electronic cigarettes.  If you are pregnant, do not drink alcohol.  If you are breastfeeding, limit how much and how often you drink alcohol.  Limit alcohol intake to no more than 1  drink per day for nonpregnant women. One drink equals 12 ounces of beer, 5 ounces of wine, or 1 ounces of hard liquor.  Do not use street drugs.  Do not share needles.  Ask your health care provider for help if you need support or information about quitting drugs.  Tell your health care provider if you often feel depressed.  Tell your health care provider if you have ever been abused or do not feel safe at home.   This information is not intended to replace advice given to you by your health care provider. Make sure you discuss any questions you have with your health care provider.   Document Released: 04/12/2011 Document Revised: 10/18/2014 Document Reviewed: 08/29/2013 Elsevier Interactive Patient Education [employer].

## 2016-12-15 ENCOUNTER — Other Ambulatory Visit: Payer: Self-pay | Admitting: Adult Health

## 2016-12-15 DIAGNOSIS — F32A Depression, unspecified: Secondary | ICD-10-CM

## 2016-12-15 DIAGNOSIS — F329 Major depressive disorder, single episode, unspecified: Secondary | ICD-10-CM

## 2016-12-15 DIAGNOSIS — Z760 Encounter for issue of repeat prescription: Secondary | ICD-10-CM

## 2016-12-15 NOTE — Telephone Encounter (Signed)
Ok to refill for 6 momths

## 2016-12-28 ENCOUNTER — Ambulatory Visit (HOSPITAL_BASED_OUTPATIENT_CLINIC_OR_DEPARTMENT_OTHER): Payer: Medicare Other | Admitting: Hematology and Oncology

## 2016-12-28 ENCOUNTER — Encounter: Payer: Self-pay | Admitting: Hematology and Oncology

## 2016-12-28 ENCOUNTER — Other Ambulatory Visit (HOSPITAL_BASED_OUTPATIENT_CLINIC_OR_DEPARTMENT_OTHER): Payer: Medicare Other

## 2016-12-28 ENCOUNTER — Telehealth: Payer: Self-pay | Admitting: Hematology and Oncology

## 2016-12-28 DIAGNOSIS — Z8572 Personal history of non-Hodgkin lymphomas: Secondary | ICD-10-CM

## 2016-12-28 LAB — COMPREHENSIVE METABOLIC PANEL
ALBUMIN: 4.2 g/dL (ref 3.5–5.0)
ALT: 12 U/L (ref 0–55)
AST: 20 U/L (ref 5–34)
Alkaline Phosphatase: 111 U/L (ref 40–150)
Anion Gap: 10 mEq/L (ref 3–11)
BILIRUBIN TOTAL: 0.55 mg/dL (ref 0.20–1.20)
BUN: 20.3 mg/dL (ref 7.0–26.0)
CALCIUM: 10.1 mg/dL (ref 8.4–10.4)
CO2: 29 mEq/L (ref 22–29)
Chloride: 103 mEq/L (ref 98–109)
Creatinine: 0.9 mg/dL (ref 0.6–1.1)
EGFR: 66 mL/min/{1.73_m2} — ABNORMAL LOW (ref >90)
Glucose: 85 mg/dl (ref 70–140)
Potassium: 4.6 mEq/L (ref 3.5–5.1)
Sodium: 142 mEq/L (ref 136–145)
TOTAL PROTEIN: 6.8 g/dL (ref 6.4–8.3)

## 2016-12-28 LAB — CBC WITH DIFFERENTIAL/PLATELET
BASO%: 0.9 % (ref 0.0–2.0)
BASOS ABS: 0.1 10*3/uL (ref 0.0–0.1)
EOS%: 6 % (ref 0.0–7.0)
Eosinophils Absolute: 0.4 10*3/uL (ref 0.0–0.5)
HEMATOCRIT: 41.9 % (ref 34.8–46.6)
HEMOGLOBIN: 14 g/dL (ref 11.6–15.9)
LYMPH%: 18.5 % (ref 14.0–49.7)
MCH: 31.9 pg (ref 25.1–34.0)
MCHC: 33.4 g/dL (ref 31.5–36.0)
MCV: 95.4 fL (ref 79.5–101.0)
MONO#: 0.8 10*3/uL (ref 0.1–0.9)
MONO%: 10.9 % (ref 0.0–14.0)
NEUT#: 4.4 10*3/uL (ref 1.5–6.5)
NEUT%: 63.7 % (ref 38.4–76.8)
Platelets: 263 10*3/uL (ref 145–400)
RBC: 4.39 10*6/uL (ref 3.70–5.45)
RDW: 12.9 % (ref 11.2–14.5)
WBC: 7 10*3/uL (ref 3.9–10.3)
lymph#: 1.3 10*3/uL (ref 0.9–3.3)

## 2016-12-28 LAB — LACTATE DEHYDROGENASE: LDH: 278 U/L — ABNORMAL HIGH (ref 125–245)

## 2016-12-28 NOTE — Progress Notes (Signed)
[facility] OFFICE PROGRESS NOTE  Patient Care Team: [name], NP as PCP - General (Family Medicine)  SUMMARY OF ONCOLOGIC HISTORY: Oncology History   --Early 2000.  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 She was treated with chlorambucil in 2000.   --2007 - 09/2008.  Treated with single agent Rituxan.   --12/2008.  She had progressive adenopathy in chest and abdomen and repeat biopsy showed low grade follicular and diffuse B cell NHL.   --01/2009 - 07/2009. She required left ureteral stent and was treated with Treanda/Rituxan x 5 cycles from 02-2009 thru 06-2009.   --06/2009.  On maintenance Rituxan every 3 months without difficulty.  --04/02/2013.    CT CAP demonstrated  stable disease No evidence of lymphoma recurrence within the abdomen or pelvis but new mild right axillary lymphadenopathy without mediastinal adenopathy     INTERVAL HISTORY: Please see below for problem oriented charting. She returns for further follow-up. She has quit smoking successfully last year. She denies recent infection. No new lymphadenopathy. Denies recurrence of skin rashes  REVIEW OF SYSTEMS:   Constitutional: Denies fevers, chills or abnormal weight loss Eyes: Denies blurriness of vision Ears, nose, mouth, throat, and face: Denies mucositis or sore throat Respiratory: Denies cough, dyspnea or wheezes Cardiovascular: Denies palpitation, chest discomfort or lower extremity swelling Gastrointestinal:  Denies nausea, heartburn or change in bowel habits Skin: Denies abnormal skin rashes Lymphatics: Denies new lymphadenopathy or easy bruising Neurological:Denies numbness, tingling or new weaknesses Behavioral/Psych: Mood is stable, no new changes  All other systems were reviewed with the patient and are negative.  I have reviewed the past medical history, past surgical history,  social history and family history with the patient and they are unchanged from previous note.  ALLERGIES:  is allergic to contrast media [iodinated diagnostic agents]; iohexol; povidone-iodine; and penicillins.  MEDICATIONS:  Current Outpatient Prescriptions  Medication Sig Dispense Refill  . aspirin 81 MG tablet Take 81 mg by mouth daily.    . Cholecalciferol 2000 UNITS TABS Take 1 tablet by mouth daily.      . citalopram (CELEXA) 40 MG tablet TAKE ONE TABLET BY MOUTH DAILY 90 tablet 1  . furosemide (LASIX) 20 MG tablet Take one tablet by mouth every 3 days as needed for swelling. 90 tablet 3   No current facility-administered medications for this visit.     PHYSICAL EXAMINATION: ECOG PERFORMANCE STATUS: 0 - Asymptomatic GENERAL:alert, no distress and comfortable SKIN: skin color, texture, turgor are normal, no rashes or significant lesions EYES: normal, Conjunctiva are pink and non-injected, sclera clear OROPHARYNX:no exudate, no erythema and lips, buccal mucosa, and tongue normal  NECK: supple, thyroid normal size, non-tender, without nodularity LYMPH:  no palpable lymphadenopathy in the cervical, axillary or inguinal LUNGS: clear to auscultation and percussion with normal breathing effort HEART: regular rate & rhythm and no murmurs and no lower extremity edema ABDOMEN:abdomen soft, non-tender and normal bowel sounds Musculoskeletal:no cyanosis of digits and no clubbing  NEURO: alert & oriented x 3 with fluent speech, no focal motor/sensory deficits  LABORATORY DATA:  I have reviewed the data as listed    Component Value Date/Time   NA 143 04/30/2016 0906   NA 141 12/29/2015 0851   K 3.9 04/30/2016 0906   K 4.3 12/29/2015 0851   CL 104 04/30/2016 0906   CL 101 03/28/2013 0826   CO2 29 04/30/2016 0906   CO2 29 12/29/2015  0851   GLUCOSE 86 04/30/2016 0906   GLUCOSE 87 12/29/2015 0851   GLUCOSE 84 03/28/2013 0826   BUN 24 (H) 04/30/2016 0906   BUN 13.7 12/29/2015 0851    CREATININE 0.95 04/30/2016 0906   CREATININE 0.9 12/29/2015 0851   CALCIUM 10.1 04/30/2016 0906   CALCIUM 9.5 12/29/2015 0851   PROT 6.4 04/30/2016 0906   PROT 6.7 12/29/2015 0851   ALBUMIN 4.4 04/30/2016 0906   ALBUMIN 4.1 12/29/2015 0851   AST 15 04/30/2016 0906   AST 17 12/29/2015 0851   ALT 8 04/30/2016 0906   ALT 10 12/29/2015 0851   ALKPHOS 131 (H) 04/30/2016 0906   ALKPHOS 121 12/29/2015 0851   BILITOT 0.5 04/30/2016 0906   BILITOT 0.51 12/29/2015 0851   GFRNONAA 80.06 05/22/2010 1106   GFRAA  03/27/2009 1035    >60        The eGFR has been calculated using the MDRD equation. This calculation has not been validated in all clinical situations. eGFR's persistently <60 mL/min signify possible Chronic Kidney Disease.    No results found for: SPEP, UPEP  Lab Results  Component Value Date   WBC 7.0 12/28/2016   NEUTROABS 4.4 12/28/2016   HGB 14.0 12/28/2016   HCT 41.9 12/28/2016   MCV 95.4 12/28/2016   PLT 263 12/28/2016      Chemistry      Component Value Date/Time   NA 143 04/30/2016 0906   NA 141 12/29/2015 0851   K 3.9 04/30/2016 0906   K 4.3 12/29/2015 0851   CL 104 04/30/2016 0906   CL 101 03/28/2013 0826   CO2 29 04/30/2016 0906   CO2 29 12/29/2015 0851   BUN 24 (H) 04/30/2016 0906   BUN 13.7 12/29/2015 0851   CREATININE 0.95 04/30/2016 0906   CREATININE 0.9 12/29/2015 0851      Component Value Date/Time   CALCIUM 10.1 04/30/2016 0906   CALCIUM 9.5 12/29/2015 0851   ALKPHOS 131 (H) 04/30/2016 0906   ALKPHOS 121 12/29/2015 0851   AST 15 04/30/2016 0906   AST 17 12/29/2015 0851   ALT 8 04/30/2016 0906   ALT 10 12/29/2015 0851   BILITOT 0.5 04/30/2016 0906   BILITOT 0.51 12/29/2015 0851      ASSESSMENT & PLAN:  History of B-cell lymphoma Currently, she has no signs of disease. The patient is a long-term cancer survivor and currently is 5 years out from last recurrence. There is no data beyond 2 years to use maintenance  rituximab. Continue supportive care. I will see her 1 year from now with history, physical examination and blood work only   Orders Placed This Encounter  Procedures  . Comprehensive metabolic panel    Standing Status:   Future    Standing Expiration Date:   02/01/2018  . CBC with Differential/Platelet    Standing Status:   Future    Standing Expiration Date:   02/01/2018  . Lactate dehydrogenase    Standing Status:   Future    Standing Expiration Date:   12/28/2017   All questions were answered. The patient knows to call the clinic with any problems, questions or concerns. No barriers to learning was detected. I spent 10 minutes counseling the patient face to face. The total time spent in the appointment was 15 minutes and more than 50% was on counseling and review of test results     [name], MD 12/28/2016 10:29 AM

## 2016-12-28 NOTE — Telephone Encounter (Signed)
Gave patient AVS and calender per 12/28/2016 los 

## 2016-12-28 NOTE — Assessment & Plan Note (Signed)
Currently, she has no signs of disease. The patient is a long-term cancer survivor and currently is 5 years out from last recurrence. There is no data beyond 2 years to use maintenance rituximab. Continue supportive care. I will see her 1 year from now with history, physical examination and blood work only

## 2017-04-28 DIAGNOSIS — H5203 Hypermetropia, bilateral: Secondary | ICD-10-CM | POA: Diagnosis not present

## 2017-04-28 DIAGNOSIS — Z961 Presence of intraocular lens: Secondary | ICD-10-CM | POA: Diagnosis not present

## 2017-04-28 DIAGNOSIS — H04123 Dry eye syndrome of bilateral lacrimal glands: Secondary | ICD-10-CM | POA: Diagnosis not present

## 2017-05-11 DIAGNOSIS — H5310 Unspecified subjective visual disturbances: Secondary | ICD-10-CM | POA: Diagnosis not present

## 2017-05-11 DIAGNOSIS — H04123 Dry eye syndrome of bilateral lacrimal glands: Secondary | ICD-10-CM | POA: Diagnosis not present

## 2017-05-17 ENCOUNTER — Other Ambulatory Visit: Payer: Self-pay | Admitting: Adult Health

## 2017-05-17 ENCOUNTER — Ambulatory Visit (INDEPENDENT_AMBULATORY_CARE_PROVIDER_SITE_OTHER): Payer: Medicare Other | Admitting: Adult Health

## 2017-05-17 ENCOUNTER — Encounter: Payer: Self-pay | Admitting: Adult Health

## 2017-05-17 ENCOUNTER — Ambulatory Visit (INDEPENDENT_AMBULATORY_CARE_PROVIDER_SITE_OTHER)
Admission: RE | Admit: 2017-05-17 | Discharge: 2017-05-17 | Disposition: A | Discharge status: Home / Self Care | Payer: Medicare Other | Source: Ambulatory Visit | Attending: Adult Health | Admitting: Adult Health

## 2017-05-17 VITALS — BP 120/68 | HR 63 | Temp 98.1°F | Wt 133.0 lb

## 2017-05-17 DIAGNOSIS — F329 Major depressive disorder, single episode, unspecified: Secondary | ICD-10-CM

## 2017-05-17 DIAGNOSIS — R0609 Other forms of dyspnea: Secondary | ICD-10-CM

## 2017-05-17 DIAGNOSIS — F32A Depression, unspecified: Secondary | ICD-10-CM

## 2017-05-17 DIAGNOSIS — J439 Emphysema, unspecified: Secondary | ICD-10-CM

## 2017-05-17 DIAGNOSIS — Z760 Encounter for issue of repeat prescription: Secondary | ICD-10-CM

## 2017-05-17 DIAGNOSIS — R0602 Shortness of breath: Secondary | ICD-10-CM | POA: Diagnosis not present

## 2017-05-17 LAB — CBC WITH DIFFERENTIAL/PLATELET
BASOS ABS: 0.1 10*3/uL (ref 0.0–0.1)
Basophils Relative: 1.1 % (ref 0.0–3.0)
Eosinophils Absolute: 0.4 10*3/uL (ref 0.0–0.7)
Eosinophils Relative: 5.7 % — ABNORMAL HIGH (ref 0.0–5.0)
HCT: 41.9 % (ref 36.0–46.0)
Hemoglobin: 14.1 g/dL (ref 12.0–15.0)
LYMPHS ABS: 1.2 10*3/uL (ref 0.7–4.0)
Lymphocytes Relative: 17.9 % (ref 12.0–46.0)
MCHC: 33.6 g/dL (ref 30.0–36.0)
MCV: 98.1 fl (ref 78.0–100.0)
MONO ABS: 0.7 10*3/uL (ref 0.1–1.0)
Monocytes Relative: 10.6 % (ref 3.0–12.0)
NEUTROS ABS: 4.3 10*3/uL (ref 1.4–7.7)
NEUTROS PCT: 64.7 % (ref 43.0–77.0)
PLATELETS: 303 10*3/uL (ref 150.0–400.0)
RBC: 4.27 Mil/uL (ref 3.87–5.11)
RDW: 12.7 % (ref 11.5–15.5)
WBC: 6.6 10*3/uL (ref 4.0–10.5)

## 2017-05-17 MED ORDER — CITALOPRAM HYDROBROMIDE 40 MG PO TABS: 40.0000 mg | ORAL_TABLET | Freq: Every day | ORAL | 1 refills | Status: DC

## 2017-05-17 MED ORDER — FLUTICASONE-SALMETEROL 250-50 MCG/DOSE IN AEPB: 1.0000 | INHALATION_SPRAY | Freq: Two times a day (BID) | RESPIRATORY_TRACT | 1 refills | Status: DC

## 2017-05-17 NOTE — Patient Instructions (Signed)
It was great seeing you today   Please go to the [facility] office for the chest x ray and stop by our lab for blood work. I will follow up with you regarding your results   Come back and see me for your yearly.

## 2017-05-17 NOTE — Progress Notes (Signed)
Subjective:    [name], female    DOB: 12/17/1940, 76 y.o.   MRN: [id number]  HPI  76 year old female who  has a past medical history of Cancer ([place]) ( APRIL OF 2000). She presents to the office today for the acute complaint of SOB with exertion. She reports that this has been happening for over the last 6-7 months and she has not noticed that this SOB has been getting worse. She notices that the SOB happens when she is outside in heat and in the evening when she has to walk up stairs. She denies any SOB when working in the house or at the gym and exercising.   She has been smoke free for about one year. She smoked for most of her life.   Denies any cough, chest pain, or feeling ill.    Review of Systems See HPI   Past Medical History:  Diagnosis Date  . Cancer ([place])  APRIL OF 2000   LOW-GRADE NON-HODGKIN'S LYMPHOMA    Social History   Social History  . Marital status: Married    Spouse name: N/A  . Number of children: N/A  . Years of education: N/A   Occupational History  . Not on file.   Social History Main Topics  . Smoking status: Former Smoker    Packs/day: 0.05    Years: 51.00    Types: Cigarettes  . Smokeless tobacco: Never Used     Comment: Quit June 1  . Alcohol use 0.0 oz/week     Comment: rare  . Drug use: No  . Sexual activity: Not on file   Other Topics Concern  . 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       Pets: Dog      Diet: Healthy diet, fruits, veggies and fish.    Exercise: None           Past Surgical History:  Procedure Laterality Date  . 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 RIGHT OOPHORECTOMY  1976    Family History    Problem Relation Age of Onset  . Cancer Mother        colon ca    Allergies  Allergen Reactions  . Contrast Media [Iodinated Diagnostic Agents] Shortness Of Breath, Rash and Other (See Comments)    Throat swells  . Iohexol Shortness Of Breath  . Povidone-Iodine Hives, Shortness Of Breath and Rash    REACTION: throat swells  . Penicillins Hives, Itching and Rash    Current Outpatient Prescriptions on File Prior to Visit  Medication Sig Dispense Refill  . aspirin 81 MG tablet Take 81 mg by mouth daily.    . Cholecalciferol 2000 UNITS TABS Take 1 tablet by mouth daily.      . furosemide (LASIX) 20 MG tablet Take one tablet by mouth every 3 days as needed for swelling. 90 tablet 3   No current facility-administered medications on file prior to visit.     BP 120/68 (BP Location: Left Arm)   Pulse 63   Temp 98.1 F (36.7 C) (Oral)  Wt 133 lb (60.3 kg)   SpO2 97%   BMI 23.56 kg/m       Objective:   Physical Exam  Constitutional: She is oriented to person, place, and time. She appears well-developed and well-nourished. No distress.  Cardiovascular: Normal rate, regular rhythm, normal heart sounds and intact distal pulses.  Exam reveals no gallop and no friction rub.   No murmur heard. Pulmonary/Chest: Effort normal and breath sounds normal. No respiratory distress. She has no wheezes. She has no rales. She exhibits no tenderness.  Neurological: She is alert and oriented to person, place, and time.  Skin: Skin is warm and dry. No rash noted. No erythema. No pallor.  Psychiatric: She has a normal mood and affect. Her behavior is normal. Judgment normal.  Nursing note and vitals reviewed.     Assessment & Plan:   1. Dyspnea on exertion - Likely from hot humid air exacerbated by lung disease from probable COPD  - DG Chest 2 View; Future - CBC with Differential/Platelet - Consider rescue inhaler - Consider referral to Pulmonary   2. Depression, unspecified depression  type  - citalopram (CELEXA) 40 MG tablet; Take 1 tablet (40 mg total) by mouth daily.  Dispense: 90 tablet; Refill: 1  [name], NP

## 2017-05-24 ENCOUNTER — Ambulatory Visit (INDEPENDENT_AMBULATORY_CARE_PROVIDER_SITE_OTHER): Payer: Medicare Other | Admitting: Adult Health

## 2017-05-24 ENCOUNTER — Encounter: Payer: Self-pay | Admitting: Adult Health

## 2017-05-24 VITALS — BP 110/66 | Temp 97.6°F | Ht 63.5 in | Wt 133.0 lb

## 2017-05-24 DIAGNOSIS — F32A Depression, unspecified: Secondary | ICD-10-CM

## 2017-05-24 DIAGNOSIS — F329 Major depressive disorder, single episode, unspecified: Secondary | ICD-10-CM | POA: Diagnosis not present

## 2017-05-24 DIAGNOSIS — J439 Emphysema, unspecified: Secondary | ICD-10-CM | POA: Diagnosis not present

## 2017-05-24 DIAGNOSIS — E782 Mixed hyperlipidemia: Secondary | ICD-10-CM

## 2017-05-24 LAB — BASIC METABOLIC PANEL
BUN: 15 mg/dL (ref 6–23)
CO2: 31 mEq/L (ref 19–32)
CREATININE: 0.84 mg/dL (ref 0.40–1.20)
Calcium: 10.2 mg/dL (ref 8.4–10.5)
Chloride: 101 mEq/L (ref 96–112)
GFR: 69.96 mL/min (ref >60.00)
Glucose, Bld: 86 mg/dL (ref 70–99)
Potassium: 4.6 mEq/L (ref 3.5–5.1)
Sodium: 141 mEq/L (ref 135–145)

## 2017-05-24 LAB — HEPATIC FUNCTION PANEL
ALBUMIN: 4.6 g/dL (ref 3.5–5.2)
ALT: 12 U/L (ref 0–35)
AST: 22 U/L (ref 0–37)
Alkaline Phosphatase: 97 U/L (ref 39–117)
BILIRUBIN TOTAL: 0.7 mg/dL (ref 0.2–1.2)
Bilirubin, Direct: 0.1 mg/dL (ref 0.0–0.3)
Total Protein: 5.9 g/dL — ABNORMAL LOW (ref 6.0–8.3)

## 2017-05-24 LAB — TSH: TSH: 5.96 u[IU]/mL — AB (ref 0.35–4.50)

## 2017-05-24 NOTE — Progress Notes (Signed)
Subjective:    [name], female    DOB: [birth year], 76 y.o.   MRN: [id number]  HPI  Patient presents for yearly follow up exam. She is a pleasant and remarkable 76 year old female who  has a past medical history of Cancer ([place]) ( APRIL OF 2000); COPD (chronic obstructive pulmonary disease) ([place]); Emphysema of lung ([place]); and Hyperlipidemia.  All immunizations and health maintenance protocols were reviewed with the patient and needed orders were placed.  Appropriate screening laboratory values were ordered for the patient including screening of hyperlipidemia, renal function and hepatic function. If indicated by BPH, a PSA was ordered.  Medication reconciliation, past medical history, social history, problem list and allergies were reviewed in detail with the patient  Goals were established with regard to weight loss, exercise, and  diet in compliance with medications. She is working out three days a week and has noticed changes in her strength. She eats a heart healthy diet   End of life planning was discussed.She has an advanced directive and living will.   She does home breast exams and has not noticed any changes. She is up to date on her vision and dental screens   She takes Celexa 20 mg for Depression and feels as though she is well controlled on this   She was started on Advair  during her last visit earlier this month for COPD- Emphsema due to SOB with exertion. She reports that she is breathing much easier since starting this inhaler   She does not take any medications for hyperlipidemia. Unfortunately, she hd a cup of coffee with creamer this morning   She denies any acute complaints today    Review of Systems  Constitutional: Negative.   HENT: Negative.   Eyes: Negative.   Respiratory: Negative.   Cardiovascular: Negative.   Gastrointestinal: Negative.   Endocrine: Negative.   Genitourinary: Negative.   Musculoskeletal: Negative.   Skin:  Negative.   Allergic/Immunologic: Negative.   Neurological: Negative.   Hematological: Negative.   Psychiatric/Behavioral: Negative.   All other systems reviewed and are negative.  Past Medical History:  Diagnosis Date  . 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 COPD (chronic obstructive pulmonary disease) ([place])   . Emphysema of lung ([place])   . Hyperlipidemia     Social History   Social History  . Marital status: Married    Spouse name: N/A  . Number of children: N/A  . Years of education: N/A   Occupational History  . Not on file.   Social History Main Topics  . Smoking status: Former Smoker    Packs/day: 0.05    Years: 51.00    Types: Cigarettes  . Smokeless tobacco: Never Used     Comment: Quit June 1  . Alcohol use 0.0 oz/week     Comment: 1 glass of wine monthly  . Drug use: No  . Sexual activity: Not on file   Other Topics Concern  . 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       Pets: Dog      Diet: Healthy diet, fruits, veggies and fish.  Exercise: None           Past Surgical History:  Procedure Laterality Date  . 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 RIGHT OOPHORECTOMY  1976    Family History  Problem Relation Age of Onset  . Cancer Mother        colon ca    Allergies  Allergen Reactions  . Contrast Media [Iodinated Diagnostic Agents] Shortness Of Breath, Rash and Other (See Comments)    Throat swells  . Iohexol Shortness Of Breath  . Povidone-Iodine Hives, Shortness Of Breath and Rash    REACTION: throat swells  . Penicillins Hives, Itching and Rash    Current Outpatient Prescriptions on File Prior to Visit  Medication  Sig Dispense Refill  . aspirin 81 MG tablet Take 81 mg by mouth daily.    . Cholecalciferol 2000 UNITS TABS Take 1 tablet by mouth daily.      . citalopram (CELEXA) 40 MG tablet Take 1 tablet (40 mg total) by mouth daily. 90 tablet 1  . Fluticasone-Salmeterol (ADVAIR DISKUS) 250-50 MCG/DOSE AEPB Inhale 1 puff into the lungs 2 (two) times daily. 60 each 1  . furosemide (LASIX) 20 MG tablet Take one tablet by mouth every 3 days as needed for swelling. 90 tablet 3   No current facility-administered medications on file prior to visit.     BP 110/66 (BP Location: Left Arm)   Temp 97.6 F (36.4 C) (Oral)   Ht 5' 3.5" (1.613 m)   Wt 133 lb (60.3 kg)   BMI 23.19 kg/m       Objective:   Physical Exam  Constitutional: She is oriented to person, place, and time. She appears well-developed and well-nourished. No distress.  HENT:  Head: Normocephalic and atraumatic.  Right Ear: External ear normal.  Left Ear: External ear normal.  Nose: Nose normal.  Mouth/Throat: Oropharynx is clear and moist. No oropharyngeal exudate.  Eyes: Pupils are equal, round, and reactive to light. Conjunctivae and EOM are normal. Right eye exhibits no discharge. Left eye exhibits no discharge. No scleral icterus.  Neck: Normal range of motion. Neck supple. No JVD present. Carotid bruit is not present. No tracheal deviation present. No thyroid mass and no thyromegaly present.  Cardiovascular: Normal rate, regular rhythm, normal heart sounds and intact distal pulses.  Exam reveals no gallop and no friction rub.   No murmur heard. Pulmonary/Chest: Effort normal and breath sounds normal. No stridor. No respiratory distress. She has no wheezes. She has no rales. She exhibits no tenderness.  Abdominal: Soft. Normal appearance and bowel sounds are normal. She exhibits no distension and no mass. There is no tenderness. There is no rebound and no guarding.  Musculoskeletal: Normal range of motion. She exhibits no edema,  tenderness or deformity.  Lymphadenopathy:    She has no cervical adenopathy.  Neurological: She is alert and oriented to person, place, and time. She has normal reflexes. She displays normal reflexes. No cranial nerve deficit. She exhibits normal muscle tone. Coordination normal.  Skin: Skin is warm and dry. No rash noted. No erythema. No pallor.  Psychiatric: She has a normal mood and affect. Her behavior is normal. Judgment and thought content normal.  Nursing note and vitals reviewed.     Assessment & Plan:  1. Depression, unspecified depression type - Well controlled with Celexa- no change in medication at this   2. Mixed hyperlipidemia - Cannot get  lipid panel this morning as she had  - Basic metabolic panel - Hepatic function panel - TSH  3. Pulmonary emphysema, unspecified emphysema type ([place]) - Adviar is controlling her well - She is waiting to hear from pulmonary   [name], NP

## 2017-05-24 NOTE — Patient Instructions (Signed)
It was great seeing you today   I will follow up with you regarding your blood work.   Continue to exercise and eat healthy   Follow up with me in one year

## 2017-05-25 ENCOUNTER — Other Ambulatory Visit (INDEPENDENT_AMBULATORY_CARE_PROVIDER_SITE_OTHER): Payer: Medicare Other

## 2017-05-25 DIAGNOSIS — R946 Abnormal results of thyroid function studies: Secondary | ICD-10-CM

## 2017-05-25 LAB — T3, FREE: T3, Free: 3.6 pg/mL (ref 2.3–4.2)

## 2017-05-25 LAB — T4, FREE: Free T4: 0.85 ng/dL (ref 0.60–1.60)

## 2017-06-20 ENCOUNTER — Telehealth: Payer: Self-pay | Admitting: Adult Health

## 2017-06-20 NOTE — Telephone Encounter (Signed)
Pt state that the Rx Advair is giving her diarrhea and would like to see if she could try Brio.    Pharm:  [facility]

## 2017-06-22 ENCOUNTER — Other Ambulatory Visit: Payer: Self-pay | Admitting: Adult Health

## 2017-06-22 MED ORDER — FLUTICASONE FUROATE-VILANTEROL 100-25 MCG/INH IN AEPB: 1.0000 | INHALATION_SPRAY | Freq: Every day | RESPIRATORY_TRACT | 6 refills | Status: DC

## 2017-06-22 NOTE — Telephone Encounter (Signed)
Breo was sent in

## 2017-06-22 NOTE — Telephone Encounter (Signed)
Please advise if rx change is ok

## 2017-07-05 ENCOUNTER — Ambulatory Visit (INDEPENDENT_AMBULATORY_CARE_PROVIDER_SITE_OTHER): Payer: Medicare Other | Admitting: Internal Medicine

## 2017-07-05 ENCOUNTER — Encounter: Payer: Self-pay | Admitting: Internal Medicine

## 2017-07-05 VITALS — BP 124/72 | HR 102 | Ht 64.0 in | Wt 133.2 lb

## 2017-07-05 DIAGNOSIS — J449 Chronic obstructive pulmonary disease, unspecified: Secondary | ICD-10-CM

## 2017-07-05 MED ORDER — FLUTICASONE FUROATE-VILANTEROL 100-25 MCG/INH IN AEPB: 1.0000 | INHALATION_SPRAY | Freq: Every morning | RESPIRATORY_TRACT | Status: DC

## 2017-07-05 NOTE — Assessment & Plan Note (Addendum)
Quit smoking 2016 s symptoms - onset doe 11/2016  - Spirometry 07/05/2017  FEV1 1.15 (55%)  Ratio 56   - Trial of BREO 100 one each am   Unclear why new onset doe years after quit smoking but clearly has airflow obst and clinically improved on advair so rec challenge with breo since she already has it waiting for her but technically Pt is Group B in terms of symptom/risk and laba/lama therefore should also be considered esp if doesn't tolerate breo though note not clear which of the ingredients bothered here, more likely it was the laba than the ics component   Will see back for full pfts to regroup in 6 weeks and decide on long term rx options  Total time devoted to counseling  > 50 % of initial 60 min office visit:  review case with pt/ discussion of options/alternatives/ personally creating written customized instructions  in presence of pt  then going over those specific  Instructions directly with the pt including how to use all of the meds but in particular covering each new medication in detail and the difference between the maintenance= "automatic" meds and the prns using an action plan format for the latter (If this problem/symptom => do that organization reading Left to right).  Please see AVS from this visit for a full list of these instructions which I personally wrote for this pt and  are unique to this visit.

## 2017-07-05 NOTE — Progress Notes (Signed)
Subjective:     [name], female   DOB: [birth year], 76 y.o.   MRN: [id number]  HPI  70 yowf quit smoking 2016  no doe / cough at that point with new doe x winter 2018  referred to pulmonary clinic 07/05/2017 by Brooke Washington with GOLD II copd   H/o NHL last on Rituximab ? When stopped ? 2015 > Brooke Washington     07/05/2017 1st [facility] office visit/ Brooke Washington   Chief Complaint  Patient presents with  . Pulmonary Consult    Referred by Brooke Washington. Pt c/o SOB for the past 7 months. She gets SOB walking up a flight of stairs and also when she takes the dog out in the mornings.   felt better on Advair  But felt it caused diarrhea which improved off it  Has breo rx but hasn't picked it up yet  Was able to walk dog and do steps fine until 7 m prior to OV  Then gradually worse to point of doe = MMRC2 = can't walk a nl pace on a flat grade s sob but does fine slow and flat  No noct or am symptoms, really not coughing    No obvious day to day or daytime variability or assoc excess/ purulent sputum or mucus plugs or hemoptysis or cp or chest tightness, subjective wheeze or overt sinus or hb symptoms. No unusual exp hx or h/o childhood pna/ asthma or knowledge of premature birth.  Sleeping ok flat without nocturnal  or early am exacerbation  of respiratory  c/o's or need for noct saba. Also denies any obvious fluctuation of symptoms with weather or environmental changes or other aggravating or alleviating factors except as outlined above   Current Allergies, Complete Past Medical History, Past Surgical History, Family History, and Social History were reviewed in [employer] record.  ROS  The following are not active complaints unless bolded sore throat, dysphagia, dental problems, itching, sneezing,  nasal congestion or disharge of excess mucus or purulent secretions, ear ache,   fever, chills, sweats, unintended wt loss or wt gain, classically pleuritic or  exertional cp,  orthopnea pnd or leg swelling, presyncope, palpitations, abdominal pain, anorexia, nausea, vomiting, diarrhea  or change in bowel habits or bladder habits, change in stools or change in urine, dysuria, hematuria,  rash, arthralgias, visual complaints, headache, numbness, weakness or ataxia or problems with walking or coordination,  change in mood/affect or memory.        Current Meds  Medication Sig  . aspirin 81 MG tablet Take 81 mg by mouth daily.  . Cholecalciferol 2000 UNITS TABS Take 1 tablet by mouth daily.    . citalopram (CELEXA) 40 MG tablet Take 1 tablet (40 mg total) by mouth daily.  . furosemide (LASIX) 20 MG tablet Take one tablet by mouth every 3 days as needed for swelling.  . [DISCONTINUED] ADVAIR DISKUS 250-50 MCG/DOSE AEPB Inhale 1 puff into the lungs 2 (two) times daily.         Review of Systems     Objective:   Physical Exam    amb wf nad  Wt Readings from Last 3 Encounters:  07/05/17 133 lb 3.2 oz (60.4 kg)  05/24/17 133 lb (60.3 kg)  05/17/17 133 lb (60.3 kg)    Vital signs reviewed - Note on arrival 02 sats  93% on RA      HEENT: nl dentition, turbinates bilaterally, and oropharynx. Nl external ear canals without  cough reflex   NECK :  without JVD/Nodes/TM/ nl carotid upstrokes bilaterally   LUNGS: no acc muscle use,  slt barrel contour chest with distant bs, mildly prolonged Texp s audible wheeze    CV:  RRR  no s3 or murmur or increase in P2, and no edema   ABD:  soft and nontender with late  nspiratory Hoover's in the supine position. No bruits or organomegaly appreciated, bowel sounds nl  MS:  Nl gait/ ext warm without deformities, calf tenderness, cyanosis or clubbing No obvious joint restrictions   SKIN: warm and dry without lesions    NEURO:  alert, approp, nl sensorium with  no motor or cerebellar deficits apparent.      I personally reviewed images and agree with radiology impression as follows:  CXR:   05/17/17 1.  Emphysema (ICD10-J43.9) with progressed pulmonary hyperinflation since 2009. No acute cardiopulmonary abnormality.  Assessment:

## 2017-07-05 NOTE — Patient Instructions (Addendum)
BREO 696  Use  one click each am  And good for the whole day  Continue doing your walks so we can be sure the medication is helping   Please schedule a follow up office visit in 6 weeks, call sooner if needed with full pfts  - don't take the BREO that morning

## 2017-07-28 ENCOUNTER — Ambulatory Visit (INDEPENDENT_AMBULATORY_CARE_PROVIDER_SITE_OTHER): Payer: Medicare Other | Admitting: *Deleted

## 2017-07-28 DIAGNOSIS — Z23 Encounter for immunization: Secondary | ICD-10-CM | POA: Diagnosis not present

## 2017-08-16 ENCOUNTER — Ambulatory Visit (INDEPENDENT_AMBULATORY_CARE_PROVIDER_SITE_OTHER): Payer: Medicare Other | Admitting: Internal Medicine

## 2017-08-16 ENCOUNTER — Encounter: Payer: Self-pay | Admitting: Internal Medicine

## 2017-08-16 VITALS — BP 104/70 | HR 76 | Ht 63.0 in | Wt 131.0 lb

## 2017-08-16 DIAGNOSIS — J449 Chronic obstructive pulmonary disease, unspecified: Secondary | ICD-10-CM

## 2017-08-16 LAB — PULMONARY FUNCTION TEST
DL/VA % PRED: 47 %
DL/VA: 2.23 ml/min/mmHg/L
DLCO COR % PRED: 35 %
DLCO UNC % PRED: 36 %
DLCO cor: 8.12 ml/min/mmHg
DLCO unc: 8.29 ml/min/mmHg
FEF 25-75 Post: 0.76 L/sec
FEF 25-75 Pre: 0.49 L/sec
FEF2575-%CHANGE-POST: 56 %
FEF2575-%PRED-POST: 50 %
FEF2575-%Pred-Pre: 32 %
FEV1-%CHANGE-POST: 17 %
FEV1-%Pred-Post: 66 %
FEV1-%Pred-Pre: 56 %
FEV1-PRE: 1.11 L
FEV1-Post: 1.31 L
FEV1FVC-%CHANGE-POST: 3 %
FEV1FVC-%Pred-Pre: 73 %
FEV6-%Change-Post: 13 %
FEV6-%Pred-Post: 91 %
FEV6-%Pred-Pre: 80 %
FEV6-PRE: 2 L
FEV6-Post: 2.27 L
FEV6FVC-%Change-Post: 0 %
FEV6FVC-%PRED-PRE: 103 %
FEV6FVC-%Pred-Post: 103 %
FVC-%Change-Post: 13 %
FVC-%Pred-Post: 88 %
FVC-%Pred-Pre: 78 %
FVC-PRE: 2.05 L
FVC-Post: 2.32 L
POST FEV1/FVC RATIO: 56 %
Post FEV6/FVC ratio: 98 %
Pre FEV1/FVC ratio: 54 %
Pre FEV6/FVC Ratio: 98 %
RV % PRED: 124 %
RV: 2.84 L
TLC % pred: 102 %
TLC: 5.01 L

## 2017-08-16 NOTE — Patient Instructions (Addendum)
Breo 100 one puff daily whenever you feel you will need it for the activity you plan that day    Please schedule a follow up visit in 6 months but call sooner if needed

## 2017-08-16 NOTE — Progress Notes (Signed)
Subjective:     [name], female   DOB: [birth year], 76 y.o.   MRN: [id number]      Brief patient profile:  76 yowf quit smoking 2016  no doe / cough at that point with new doe x winter 2018  referred to pulmonary clinic 07/05/2017 by Nafziger with GOLD II copd   H/o NHL last on Rituximab ? When stopped ? 2015 > Alvy Bimler     07/05/2017 1st [facility] office visit/ Brooke Washington   Chief Complaint  Patient presents with  . Pulmonary Consult    Referred by [name]. Pt c/o SOB for the past 7 months. She gets SOB walking up a flight of stairs and also when she takes the dog out in the mornings.   felt better on Advair  But felt it caused diarrhea which improved off it  Has breo rx but hasn't picked it up yet  Was able to walk dog and do steps fine until 7 m prior to OV  Then gradually worse to point of doe = MMRC2 = can't walk a nl pace on a flat grade s sob but does fine slow and flat  No noct or am symptoms, really not coughing   rec BREO 664  Use  one click each am  And good for the whole day Continue doing your walks so we can be sure the medication is helping  Please schedule a follow up office visit in 6 weeks, call sooner if needed with full pfts  - don't take the [place] that morning   08/16/2017  f/u ov/Brooke Washington re: GOLD II copd  Chief Complaint  Patient presents with  . Follow-up    PFT's done today. Pt states that her breathing has improved on Breo. She is taking med every other day.    Says can really tell the difference with BREO in terms of ex tol   No obvious day to day or daytime variability or assoc excess/ purulent sputum or mucus plugs or hemoptysis or cp or chest tightness, subjective wheeze or overt sinus or hb symptoms. No unusual exp hx or h/o childhood pna/ asthma or knowledge of premature birth.  Sleeping ok flat without nocturnal  or early am exacerbation  of respiratory  c/o's or need for noct saba. Also denies any obvious fluctuation of symptoms  with weather or environmental changes or other aggravating or alleviating factors except as outlined above   Current Allergies, Complete Past Medical History, Past Surgical History, Family History, and Social History were reviewed in [employer] record.  ROS  The following are not active complaints unless bolded Hoarseness, sore throat, dysphagia, dental problems, itching, sneezing,  nasal congestion or discharge of excess mucus or purulent secretions, ear ache,   fever, chills, sweats, unintended wt loss or wt gain, classically pleuritic or exertional cp,  orthopnea pnd or leg swelling, presyncope, palpitations, abdominal pain, anorexia, nausea, vomiting, diarrhea  or change in bowel habits or change in bladder habits, change in stools or change in urine, dysuria, hematuria,  rash, arthralgias, visual complaints, headache, numbness, weakness or ataxia or problems with walking or coordination,  change in mood/affect or memory.                    Objective:   Physical Exam    amb wf nad  Wt Readings from Last 3 Encounters:  07/05/17 133 lb 3.2 oz (60.4 kg)  05/24/17 133 lb (60.3 kg)  05/17/17  133 lb (60.3 kg)    Vital signs reviewed - Note on arrival 02 sats  93% on RA      HEENT: nl dentition, turbinates bilaterally, and oropharynx. Nl external ear canals without cough reflex   NECK :  without JVD/Nodes/TM/ nl carotid upstrokes bilaterally   LUNGS: no acc muscle use,  slt barrel contour chest with distant bs, mildly prolonged Texp s audible wheeze    CV:  RRR  no s3 or murmur or increase in P2, and no edema   ABD:  soft and nontender with late  nspiratory Hoover's in the supine position. No bruits or organomegaly appreciated, bowel sounds nl  MS:  Nl gait/ ext warm without deformities, calf tenderness, cyanosis or clubbing No obvious joint restrictions   SKIN: warm and dry without lesions    NEURO:  alert, approp, nl sensorium with  no motor or  cerebellar deficits apparent.      I personally reviewed images and agree with radiology impression as follows:  CXR:   05/17/17 1. Emphysema (ICD10-J43.9) with progressed pulmonary hyperinflation since 2009. No acute cardiopulmonary abnormality.  Assessment:

## 2017-08-16 NOTE — Progress Notes (Signed)
PFT done today. 

## 2017-08-18 ENCOUNTER — Encounter: Payer: Self-pay | Admitting: Internal Medicine

## 2017-08-18 NOTE — Assessment & Plan Note (Signed)
Quit smoking 2016 s symptoms - onset doe 11/2016  - Spirometry 07/05/2017  FEV1 1.15 (55%)  Ratio 56   - Trial of BREO 100 one each am  - PFT's  08/16/2017  FEV1 1.31 (66 % ) ratio 56  p 17 % improvement from saba p nothing prior to study with DLCO  36/35 % corrects to 47 % for alv volume   - 08/16/2017  After extensive coaching elipta  effectiveness =    90% with DPI  Relatively mild copd and typically Not limited by breathing from desired activities  Or having exac so ok to use BREO as much as she wants to on days when she's planning activity above adls unless having more symptoms or more exac than present.   I had an extended discussion with the patient reviewing all relevant studies completed to date and  lasting 10 minutes of a 15 minute visit    Each maintenance medication was reviewed in detail including most importantly the difference between maintenance and prns and under what circumstances the prns are to be triggered using an action plan format that is not reflected in the computer generated alphabetically organized AVS.    Please see AVS for specific instructions unique to this visit that I personally wrote and verbalized to the the pt in detail and then reviewed with pt  by my nurse highlighting any  changes in therapy recommended at today's visit to their plan of care.

## 2017-10-10 ENCOUNTER — Ambulatory Visit: Payer: Self-pay | Admitting: *Deleted

## 2017-10-10 NOTE — Telephone Encounter (Signed)
Pt reports  abd  Pain   With  Some upper abdominal  Tenderness   With  Some  Nausea   And  Diarrhea    For about  1   Week .  Pt reports   She  Has  Some  Slight  Improvement  Perhaps  But symptoms  Still  Persist . Pt   advised  Per protocall  To  Proceed  To  An  Urgent  Care   As  Her appt with  Pcp  Is in  2  Days  the patient advised  Not to  Put anything on  Stomach   Prior  To  Go  To  Urgent care .  If  If  Symptoms  Severe  Go  To  Er    Reason for Disposition . [1] MILD-MODERATE pain AND [2] constant AND [3] present > 2 hours  Answer Assessment - Initial Assessment Questions 1. LOCATION: "Where does it hurt?"       Middle   abd    2. RADIATION: "Does the pain shoot anywhere else?" (e.g., chest, back)       Pain   Around  To  Her  Back    3. ONSET: "When did the pain begin?" (e.g., minutes, hours or days ago)         approx   1   Week    4. SUDDEN: "Gradual or sudden onset?"        Sudden  Onset    5. PATTERN "Does the pain come and go, or is it constant?"    - If constant: "Is it getting better, staying the same, or worsening?"      (Note: Constant means the pain never goes away completely; most serious pain is constant and it progresses)     - If intermittent: "How long does it last?" "Do you have pain now?"     (Note: Intermittent means the pain goes away completely between bouts)       Slightly  Better   6. SEVERITY: "How bad is the pain?"  (e.g., Scale 1-10; mild, moderate, or severe)    - MILD (1-3): doesn't interfere with normal activities, abdomen soft and not tender to touch     - MODERATE (4-7): interferes with normal activities or awakens from sleep, tender to touch     - SEVERE (8-10): excruciating pain, doubled over, unable to do any normal activities         Mild   With  Tenderness    7. RECURRENT SYMPTOM: "Have you ever had this type of abdominal pain before?" If so, ask: "When was the last time?" and "What happened that time?"     pT  REPORTS  IN  DISTANT PAST   8.  AGGRAVATING FACTORS: "Does anything seem to cause this pain?" (e.g., foods, stress, alcohol)       Pt  Reports   Some  Pressure   After  Eating   9. CARDIAC SYMPTOMS: "Do you have any of the following symptoms: chest pain, difficulty breathing, sweating, nausea?"      Had   Some  Nausea     10. OTHER SYMPTOMS: "Do you have any other symptoms?" (e.g., fever, vomiting, diarrhea)       Diarrhea     11. PREGNANCY: "Is there any chance you are pregnant?" "When was your last menstrual period?"         N/A  Protocols used: ABDOMINAL PAIN -  UPPER-A-AH

## 2017-10-12 ENCOUNTER — Encounter: Payer: Self-pay | Admitting: Adult Health

## 2017-10-12 ENCOUNTER — Ambulatory Visit (INDEPENDENT_AMBULATORY_CARE_PROVIDER_SITE_OTHER): Payer: Medicare Other | Admitting: Adult Health

## 2017-10-12 VITALS — BP 124/72 | Temp 97.7°F | Wt 133.0 lb

## 2017-10-12 DIAGNOSIS — K2100 Gastro-esophageal reflux disease with esophagitis, without bleeding: Secondary | ICD-10-CM

## 2017-10-12 MED ORDER — PANTOPRAZOLE SODIUM 40 MG PO TBEC: 40.0000 mg | DELAYED_RELEASE_TABLET | Freq: Every day | ORAL | 3 refills | Status: DC

## 2017-10-12 NOTE — Progress Notes (Signed)
Subjective:    [name], female    DOB: 1941-07-11, 77 y.o.   MRN: [id number]  Denies waking up with a sour taste in her mouth. Eating improves pain for a short time. She is not using any Nsaids   Abdominal Pain  This is a new problem. The current episode started more than 1 month ago. The onset quality is sudden. The problem occurs daily. The problem has been gradually worsening. The pain is located in the epigastric region. The quality of the pain is a sensation of fullness. Associated symptoms include diarrhea and nausea. Pertinent negatives include no belching, constipation or fever. Relieved by: eating  She has tried H2 blockers for the symptoms.    Review of Systems  Constitutional: Negative for fever.  Respiratory: Negative.   Gastrointestinal: Positive for abdominal pain, diarrhea and nausea. Negative for abdominal distention, blood in stool, constipation and rectal pain.  Musculoskeletal: Negative.   Skin: Negative.    Past Medical History:  Diagnosis Date  . Cancer ([place])  APRIL OF 2000   LOW-GRADE NON-HODGKIN'S LYMPHOMA  . COPD (chronic obstructive pulmonary disease) ([place])   . Emphysema of lung ([place])   . Hyperlipidemia     Social History   Socioeconomic History  . Marital status: Married    Spouse name: Not on file  . Number of children: Not on file  . Years of education: Not on file  . Highest education level: Not on file  Social Needs  . Financial resource strain: Not on file  . Food insecurity - worry: Not on file  . Food insecurity - inability: Not on file  . Transportation needs - medical: Not on file  . Transportation needs - non-medical: Not on file  Occupational History  . Not on file  Tobacco Use  . Smoking status: Former Smoker    Packs/day: 0.25    Years: 51.00    Pack years: 12.75    Types: Cigarettes    Last attempt to quit: 03/12/2015    Years since quitting: 2.5  . Smokeless tobacco: Never Used  Substance and Sexual  Activity  . Alcohol use: Yes    Alcohol/week: 0.0 oz    Comment: 1 glass of wine monthly  . Drug use: No  . Sexual activity: Not on file  Other Topics Concern  . 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       Pets: Dog      Diet: Healthy diet, fruits, veggies and fish.    Exercise: None        Past Surgical History:  Procedure Laterality Date  . 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 RIGHT OOPHORECTOMY  1976    Family History  Problem Relation Age of Onset  . Cancer Mother        colon ca    Allergies  Allergen Reactions  . Contrast Media [Iodinated Diagnostic Agents] Shortness Of Breath, Rash and Other (See Comments)    Throat swells  . Iohexol Shortness Of Breath  . Povidone-Iodine Hives, Shortness Of Breath and Rash    REACTION: throat swells  .  Penicillins Hives, Itching and Rash    Current Outpatient Medications on File Prior to Visit  Medication Sig Dispense Refill  . aspirin 81 MG tablet Take 81 mg by mouth daily.    . Cholecalciferol 2000 UNITS TABS Take 1 tablet by mouth daily.      . citalopram (CELEXA) 40 MG tablet Take 1 tablet (40 mg total) by mouth daily. 90 tablet 1  . fluticasone furoate-vilanterol (BREO ELLIPTA) 100-25 MCG/INH AEPB Inhale 1 puff into the lungs every morning.    . furosemide (LASIX) 20 MG tablet Take one tablet by mouth every 3 days as needed for swelling. 90 tablet 3   No current facility-administered medications on file prior to visit.     BP 124/72 (BP Location: Left Arm)   Temp 97.7 F (36.5 C) (Oral)   Wt 133 lb (60.3 kg)   BMI 23.56 kg/m       Objective:   Physical Exam  Constitutional: She is oriented to person, place, and time. She  appears well-developed and well-nourished. No distress.  Cardiovascular: Normal rate, regular rhythm, normal heart sounds and intact distal pulses. Exam reveals no gallop.  No murmur heard. Pulmonary/Chest: Effort normal and breath sounds normal. No respiratory distress. She has no wheezes. She has no rales. She exhibits no tenderness.  Abdominal: Soft. Bowel sounds are normal. She exhibits no distension and no mass. There is no hepatosplenomegaly, splenomegaly or hepatomegaly. There is tenderness in the epigastric area and periumbilical area. There is no rebound, no guarding and no CVA tenderness. No hernia.  Neurological: She is alert and oriented to person, place, and time.  Skin: Skin is warm and dry. No rash noted. She is not diaphoretic. No erythema. No pallor.  Psychiatric: She has a normal mood and affect. Her behavior is normal. Judgment and thought content normal.  Nursing note and vitals reviewed.     Assessment & Plan:  1. Gastroesophageal reflux disease with esophagitis GERD with possible peptic ulcer disease. Will check for H.pylori. Start with Protonix and send in ABX if needed  - H. pylori antibody, IgG - pantoprazole (PROTONIX) 40 MG tablet; Take 1 tablet (40 mg total) by mouth daily.  Dispense: 90 tablet; Refill: 3 - CBC with Differential/Platelet - Educated on foods to avoid  - Follow up if no improvement in 2 weeks or sooner if needed  [name], NP

## 2017-10-13 LAB — CBC WITH DIFFERENTIAL/PLATELET
BASOS PCT: 0.6 % (ref 0.0–3.0)
Basophils Absolute: 0 10*3/uL (ref 0.0–0.1)
Eosinophils Absolute: 0.5 10*3/uL (ref 0.0–0.7)
Eosinophils Relative: 6.7 % — ABNORMAL HIGH (ref 0.0–5.0)
HEMATOCRIT: 41.5 % (ref 36.0–46.0)
Hemoglobin: 13.8 g/dL (ref 12.0–15.0)
LYMPHS ABS: 1.3 10*3/uL (ref 0.7–4.0)
LYMPHS PCT: 17.5 % (ref 12.0–46.0)
MCHC: 33.3 g/dL (ref 30.0–36.0)
MCV: 98.2 fl (ref 78.0–100.0)
Monocytes Absolute: 0.8 10*3/uL (ref 0.1–1.0)
Monocytes Relative: 11.1 % (ref 3.0–12.0)
NEUTROS ABS: 4.8 10*3/uL (ref 1.4–7.7)
NEUTROS PCT: 64.1 % (ref 43.0–77.0)
PLATELETS: 336 10*3/uL (ref 150.0–400.0)
RBC: 4.22 Mil/uL (ref 3.87–5.11)
RDW: 13.3 % (ref 11.5–15.5)
WBC: 7.5 10*3/uL (ref 4.0–10.5)

## 2017-10-13 LAB — H. PYLORI ANTIBODY, IGG: H Pylori IgG: NEGATIVE

## 2017-11-15 ENCOUNTER — Encounter (HOSPITAL_COMMUNITY): Payer: Self-pay | Admitting: Nurse Practitioner

## 2017-11-15 ENCOUNTER — Emergency Department (HOSPITAL_COMMUNITY): Payer: Medicare Other

## 2017-11-15 ENCOUNTER — Other Ambulatory Visit: Payer: Self-pay

## 2017-11-15 ENCOUNTER — Emergency Department (HOSPITAL_COMMUNITY)
Admission: EM | Admit: 2017-11-15 | Discharge: 2017-11-15 | Disposition: A | Discharge status: Home / Self Care | Payer: Medicare Other | Attending: Emergency Medicine | Admitting: Emergency Medicine

## 2017-11-15 DIAGNOSIS — J449 Chronic obstructive pulmonary disease, unspecified: Secondary | ICD-10-CM | POA: Insufficient documentation

## 2017-11-15 DIAGNOSIS — Y929 Unspecified place or not applicable: Secondary | ICD-10-CM | POA: Diagnosis not present

## 2017-11-15 DIAGNOSIS — Z87891 Personal history of nicotine dependence: Secondary | ICD-10-CM | POA: Insufficient documentation

## 2017-11-15 DIAGNOSIS — Y998 Other external cause status: Secondary | ICD-10-CM | POA: Insufficient documentation

## 2017-11-15 DIAGNOSIS — S49091A Other physeal fracture of upper end of humerus, right arm, initial encounter for closed fracture: Secondary | ICD-10-CM | POA: Diagnosis not present

## 2017-11-15 DIAGNOSIS — Z7982 Long term (current) use of aspirin: Secondary | ICD-10-CM | POA: Diagnosis not present

## 2017-11-15 DIAGNOSIS — S42411A Displaced simple supracondylar fracture without intercondylar fracture of right humerus, initial encounter for closed fracture: Secondary | ICD-10-CM | POA: Diagnosis not present

## 2017-11-15 DIAGNOSIS — Y33XXXA Other specified events, undetermined intent, initial encounter: Secondary | ICD-10-CM | POA: Insufficient documentation

## 2017-11-15 DIAGNOSIS — Y939 Activity, unspecified: Secondary | ICD-10-CM | POA: Diagnosis not present

## 2017-11-15 DIAGNOSIS — S4991XA Unspecified injury of right shoulder and upper arm, initial encounter: Secondary | ICD-10-CM | POA: Diagnosis present

## 2017-11-15 MED ORDER — NAPROXEN 250 MG PO TABS: 250.0000 mg | ORAL_TABLET | Freq: Two times a day (BID) | ORAL | 0 refills | Status: DC

## 2017-11-15 MED ORDER — HYDROCODONE-ACETAMINOPHEN 5-325 MG PO TABS: 1.0000 | ORAL_TABLET | ORAL | 0 refills | Status: DC | PRN

## 2017-11-15 MED ORDER — HYDROCODONE-ACETAMINOPHEN 5-325 MG PO TABS
1.0000 | ORAL_TABLET | Freq: Once | ORAL | Status: AC
  Administered 2017-11-15: 1 via ORAL
  Filled 2017-11-15: qty 1

## 2017-11-15 NOTE — ED Notes (Signed)
Bed: WTR6 Expected date:  Expected time:  Means of arrival:  Comments: 

## 2017-11-15 NOTE — ED Notes (Signed)
Bed: WTR8 Expected date:  Expected time:  Means of arrival:  Comments: 

## 2017-11-15 NOTE — ED Provider Notes (Signed)
[facility] DEPT Provider Note   CSN: [id number] Arrival date & time: 11/15/17  0908     History   Chief Complaint No chief complaint on file.   HPI Brooke Washington is a 77 y.o. female.  HPI Brooke Washington is a 77 y.o. female with history of B-cell lymphoma, emphysema, COPD, presents to emergency department complaining of right upper arm pain.  Patient states she has had pain to her right upper arm for several days.  In fact she has made an appointment with sports medicine doctor to be seen, her appointment is not for the next 10 days.  She states that today she leaned over to pick something up off the floor and felt a sharp pain in her right arm and heard a pop.  Since then she is unable to move her right shoulder or right arm.  Denies numbness or weakness in her hand.  Denies any falls or any other injuries.  She states she had up and down in the last several weeks.  Patient was placed in a homemade sling by her husband and brought to emergency room  Past Medical History:  Diagnosis Date  . Cancer ([place])  APRIL OF 2000   LOW-GRADE NON-HODGKIN'S LYMPHOMA  . COPD (chronic obstructive pulmonary disease) ([place])   . Emphysema of lung ([place])   . Hyperlipidemia     Patient Active Problem List   Diagnosis Date Noted  . COPD GOLD II 07/05/2017  . Excoriated rash 06/12/2013  . COLONIC POLYPS, HYPERPLASTIC, HX OF 05/28/2010  . History of B-cell lymphoma 11/27/2007  . Depression 05/05/2007    Past Surgical History:  Procedure Laterality Date  . 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 RIGHT OOPHORECTOMY  1976    OB History    No data available       Home Medications    Prior to Admission medications   Medication Sig Start Date End Date Taking? Authorizing Provider  aspirin 81 MG tablet Take 81 mg by mouth daily.   Yes [provider]  Cholecalciferol 2000 UNITS  TABS Take 1 tablet by mouth daily.     Yes [provider]  citalopram (CELEXA) 40 MG tablet Take 1 tablet (40 mg total) by mouth daily. 05/17/17  Yes Nafziger, Tommi Rumps, NP  fluticasone furoate-vilanterol (BREO ELLIPTA) 100-25 MCG/INH AEPB Inhale 1 puff into the lungs every morning. 07/05/17  Yes Tanda Rockers, MD  furosemide (LASIX) 20 MG tablet Take one tablet by mouth every 3 days as needed for swelling. Patient taking differently: Take 20 mg by mouth every other day. Take one tablet by mouth every 3 days as needed for swelling. 12/29/15  Yes Gorsuch, Ni, MD  pantoprazole (PROTONIX) 40 MG tablet Take 1 tablet (40 mg total) by mouth daily. 10/12/17  Yes Nafziger, Tommi Rumps, NP    Family History Family History  Problem Relation Age of Onset  . Cancer Mother        colon ca    Social History Social History   Tobacco Use  . Smoking status: Former Smoker    Packs/day: 0.25    Years: 51.00    Pack years: 12.75    Types: Cigarettes    Last attempt to quit: 03/12/2015    Years since quitting: 2.6  . Smokeless tobacco: Never Used  Substance Use Topics  . Alcohol use: Yes    Alcohol/week: 0.0 oz  Comment: 1 glass of wine monthly  . Drug use: No     Allergies   Contrast media [iodinated diagnostic agents]; Iohexol; Povidone-iodine; Penicillins; and Adhesive [tape]   Review of Systems Review of Systems  Constitutional: Negative for chills and fever.  Respiratory: Negative for cough, chest tightness and shortness of breath.   Cardiovascular: Negative for chest pain, palpitations and leg swelling.  Musculoskeletal: Positive for arthralgias and myalgias.  Skin: Negative for rash.  Neurological: Negative for weakness and numbness.  All other systems reviewed and are negative.    Physical Exam Updated Vital Signs BP 132/85 (BP Location: Right Arm)   Pulse (!) 106   Temp (!) 97.4 F (36.3 C) (Oral)   Resp 14   Ht 5\' 4"  (1.626 m)   Wt 59 kg (130 lb)   SpO2 94%   BMI 22.31  kg/m   Physical Exam  Constitutional: She appears well-developed and well-nourished. No distress.  Eyes: Conjunctivae are normal.  Neck: Neck supple.  Musculoskeletal:  No tenderness to palpation over the shoulder joint, however unable to move due to pain.  Tenderness to palpation over anterior right upper arm, no bruising, swelling, discoloration.  Tenderness along the bicep muscle.  No tenderness over the elbow.  Neurological: She is alert.  Skin: Skin is warm and dry.  Nursing note and vitals reviewed.    ED Treatments / Results  Labs (all labs ordered are listed, but only abnormal results are displayed) Labs Reviewed - No data to display  EKG  EKG Interpretation None       Radiology Dg Shoulder Right  Result Date: 11/15/2017 CLINICAL DATA:  Left arm pain for the past 3 weeks.  No injury. EXAM: RIGHT SHOULDER - 2+ VIEW; RIGHT HUMERUS - 2+ VIEW COMPARISON:  None. FINDINGS: There is a acute, oblique fracture through the proximal humeral diaphysis with minimal lateral and posterior displacement. No additional fracture seen. Mild acromioclavicular osteoarthritis. Diffuse osteopenia. IMPRESSION: 1. Acute, minimally displaced fracture of the proximal humeral diaphysis. Electronically Signed   By: Titus Dubin M.D.   On: 11/15/2017 10:16   Dg Humerus Right  Result Date: 11/15/2017 CLINICAL DATA:  Left arm pain for the past 3 weeks.  No injury. EXAM: RIGHT SHOULDER - 2+ VIEW; RIGHT HUMERUS - 2+ VIEW COMPARISON:  None. FINDINGS: There is a acute, oblique fracture through the proximal humeral diaphysis with minimal lateral and posterior displacement. No additional fracture seen. Mild acromioclavicular osteoarthritis. Diffuse osteopenia. IMPRESSION: 1. Acute, minimally displaced fracture of the proximal humeral diaphysis. Electronically Signed   By: Titus Dubin M.D.   On: 11/15/2017 10:16    Procedures Procedures (including critical care time)  Medications Ordered in  ED Medications  HYDROcodone-acetaminophen (NORCO/VICODIN) 5-325 MG per tablet 1 tablet (1 tablet Oral Given 11/15/17 0955)     Initial Impression / Assessment and Plan / ED Course  I have reviewed the triage vital signs and the nursing notes.  Pertinent labs & imaging results that were available during my care of the patient were reviewed by me and considered in my medical decision making (see chart for details).     Patient with right upper arm pain after feeling sharp pain and hearing a snap.  X-ray shows acute minimally displaced fracture of the proximal humeral diaphysis.  Patient is neurovascularly intact.  Will place in a sling, follow-up with orthopedics for further treatment.  Discussed results and plan with patient, who agrees with it. Home with naprosyn and norco.   Vitals:  11/15/17 0914 11/15/17 0929  BP: 132/85   Pulse: (!) 106   Resp: 14   Temp: (!) 97.4 F (36.3 C)   TempSrc: Oral   SpO2: 94%   Weight: 59 kg (130 lb) 59 kg (130 lb)  Height: 5\' 4"  (1.626 m) 5\' 4"  (1.626 m)     Final Clinical Impressions(s) / ED Diagnoses   Final diagnoses:  Closed displaced simple supracondylar fracture of right humerus without intercondylar fracture, initial encounter    ED Discharge Orders        Ordered    naproxen (NAPROSYN) 250 MG tablet  2 times daily with meals     11/15/17 1109    HYDROcodone-acetaminophen (NORCO) 5-325 MG tablet  Every 4 hours PRN     11/15/17 1109       Jeannett Senior, PA-C 11/15/17 1423    Sherwood Gambler, MD 11/15/17 1553

## 2017-11-15 NOTE — Discharge Instructions (Signed)
Ice your arm. Keep it in the sling. Pain medications as prescribed as needed. Please follow up with orthopedics doctor as referred. Call them today or tomorrow.

## 2017-11-15 NOTE — ED Triage Notes (Signed)
Patient brought in by husband for arm pain. Patient states the pain started 3 weeks ago right above the elbow. Patient had an appointment to be seen at ortho. Last night patient bent down to pick something up and felt a snap in her upper arm. Patient states it has been hurting ever since. Patient has a homemade sling that has helped stabilize arm and lessen the pain.

## 2017-11-15 NOTE — ED Notes (Signed)
Sling and ice applied to R arm. Pt verbalized understanding of application of sling.

## 2017-11-17 ENCOUNTER — Other Ambulatory Visit (INDEPENDENT_AMBULATORY_CARE_PROVIDER_SITE_OTHER): Payer: Self-pay

## 2017-11-17 ENCOUNTER — Ambulatory Visit (HOSPITAL_COMMUNITY): Admission: RE | Admit: 2017-11-17 | Payer: Medicare Other | Source: Ambulatory Visit

## 2017-11-17 ENCOUNTER — Telehealth: Payer: Self-pay | Admitting: *Deleted

## 2017-11-17 ENCOUNTER — Ambulatory Visit (INDEPENDENT_AMBULATORY_CARE_PROVIDER_SITE_OTHER): Payer: Medicare Other | Admitting: Surgery

## 2017-11-17 ENCOUNTER — Telehealth (INDEPENDENT_AMBULATORY_CARE_PROVIDER_SITE_OTHER): Payer: Self-pay | Admitting: *Deleted

## 2017-11-17 ENCOUNTER — Other Ambulatory Visit (INDEPENDENT_AMBULATORY_CARE_PROVIDER_SITE_OTHER): Payer: Self-pay | Admitting: Surgery

## 2017-11-17 DIAGNOSIS — S42324A Nondisplaced transverse fracture of shaft of humerus, right arm, initial encounter for closed fracture: Secondary | ICD-10-CM

## 2017-11-17 DIAGNOSIS — S42331A Displaced oblique fracture of shaft of humerus, right arm, initial encounter for closed fracture: Secondary | ICD-10-CM | POA: Diagnosis not present

## 2017-11-17 DIAGNOSIS — S42301A Unspecified fracture of shaft of humerus, right arm, initial encounter for closed fracture: Secondary | ICD-10-CM

## 2017-11-17 MED ORDER — HYDROCODONE-ACETAMINOPHEN 5-325 MG PO TABS: 1.0000 | ORAL_TABLET | Freq: Four times a day (QID) | ORAL | 0 refills | Status: DC | PRN

## 2017-11-17 NOTE — Telephone Encounter (Signed)
Pt was originally scheduled for MRI Humerous with and without contrast at [facility] due to pt says to too far to travel, I have been on phone most to the day trying to get pt scheduled at other facilities in the [place] area no one has any appts until the weekend of the middle of week. Pt has refused to go anywhere out of [place].  I spoke with Benjiman Core PA-C to advise me no facility had any openings and what he would like me to do. He wants to see if can get in soon since no appts and to just document.  I called [facility] back after the discussion and spoke with Mardene Celeste and she worked pt in for the Saturday 11/19/17 at 545p. She is going to call pt and spouse to get scheduled and ask questions for the MRI in hopes they take the appt.

## 2017-11-17 NOTE — Progress Notes (Signed)
Office Visit Note   Patient: Brooke Washington           Date of Birth: 08/05/1941           MRN: [id number] Visit Date: 11/17/2017              Requested by: [name], NP [address], [place] [address] PCP: [name], NP   Assessment & Plan: Visit Diagnoses:  1. Closed displaced oblique fracture of shaft of right humerus, initial encounter   2. Closed nondisplaced transverse fracture of shaft of right humerus, initial encounter   Possible right humerus pathologic fracture  Plan: X-rays from ER visit reviewed with Dr. Lorin Mercy.  We both feel that there appears to be lytic lesions at the site of the humerus fracture.  We will schedule MRI with and without contrast.  Will continue wearing shoulder immobilizer.  Do not use her right arm.  Follow-up with Dr. Lorin Mercy after completion of her study to discuss results and further treatment options which may be conservative versus operative intervention.  Follow-Up Instructions: Return in about 2 weeks (around 12/01/2017) for with Dr Lorin Mercy to review MRI humerus.   Orders:  Orders Placed This Encounter  Procedures  . MR HUMERUS RIGHT W WO CONTRAST   Meds ordered this encounter  Medications  . HYDROcodone-acetaminophen (NORCO/VICODIN) 5-325 MG tablet    Sig: Take 1 tablet by mouth every 6 (six) hours as needed for moderate pain.    Dispense:  40 tablet    Refill:  0      Procedures: No procedures performed   Clinical Data: No additional findings.   Subjective: Chief Complaint  Patient presents with  . Right Shoulder - Pain, Fracture    HPI Patient comes in today with right humerus fracture.  She was referred by the emergency room.  Patient states that about 3-4 weeks ago she began having a lot of soreness in her right upper arm.  Did not have a specific injury.  For those several weeks she had limited use of her arm due to pain.  On February 4 states that she bent over to pull her jeans off her foot with  her right hand and she felt something pop and had severe pain in her right mid humerus area.  Went to the emergency room the next day and had x-rays and report read acute minimally displaced fracture of the proximal humeral diaphysis.  Patient was put in a shoulder immobilizer and advised to follow-up in our office.  Patient denies any real forceful trauma or fall to the right arm.  Past medical history significant for non-Hodgkin's lymphoma and is being followed by oncologist Dr. [name].   Review of Systems No current cardiac pulmonary GI GU issues  Objective: Vital Signs: There were no vitals taken for this visit.  Physical Exam  Constitutional: She is oriented to person, place, and time. She appears distressed (Patient does have obvious comfort due to her humerus fracture.).  Pulmonary/Chest: No respiratory distress.  Abdominal: She exhibits no distension.  Musculoskeletal:  Shoulder immobilizer on.  She has good range of motion of her fingers and wrist.  No finger or wrist extensor weakness.  Neurovascular intact.  Neurological: She is alert and oriented to person, place, and time.  Psychiatric: She has a normal mood and affect.    Ortho Exam  Specialty Comments:  No specialty comments available.  Imaging: No results found.   PMFS History: Patient Active Problem List  Diagnosis Date Noted  . COPD GOLD II 07/05/2017  . Excoriated rash 06/12/2013  . COLONIC POLYPS, HYPERPLASTIC, HX OF 05/28/2010  . History of B-cell lymphoma 11/27/2007  . Depression 05/05/2007   Past Medical History:  Diagnosis Date  . Cancer ([place])  APRIL OF 2000   LOW-GRADE NON-HODGKIN'S LYMPHOMA  . COPD (chronic obstructive pulmonary disease) ([place])   . Emphysema of lung ([place])   . Hyperlipidemia     Family History  Problem Relation Age of Onset  . Cancer Mother        colon ca    Past Surgical History:  Procedure Laterality Date  . CYSTECTOMY W/ URETEROILEAL CONDUIT  1997   A/P RIGHT PARTIAL  LEFT URETER RESECTION FOR VASCULAR MALFORMATION  . RIGHT OOPHORECTOMY  1976   Social History   Occupational History  . Not on file  Tobacco Use  . Smoking status: Former Smoker    Packs/day: 0.25    Years: 51.00    Pack years: 12.75    Types: Cigarettes    Last attempt to quit: 03/12/2015    Years since quitting: 2.6  . Smokeless tobacco: Never Used  Substance and Sexual Activity  . Alcohol use: Yes    Alcohol/week: 0.0 oz    Comment: 1 glass of wine monthly  . Drug use: No  . Sexual activity: Not on file

## 2017-11-17 NOTE — Telephone Encounter (Signed)
"  This is Dr. Ricard Dillon office calling to schedule appointment for this established patient.  Placed on hold with scheduling option two.  Can you schedule appointment?"   Transferred to collaborative extension to receive best help with [facility] provider scheduling providing information for provider review.

## 2017-11-18 ENCOUNTER — Encounter (INDEPENDENT_AMBULATORY_CARE_PROVIDER_SITE_OTHER): Payer: Self-pay | Admitting: Surgery

## 2017-11-19 ENCOUNTER — Ambulatory Visit
Admission: RE | Admit: 2017-11-19 | Discharge: 2017-11-19 | Disposition: A | Discharge status: Home / Self Care | Payer: Medicare Other | Source: Ambulatory Visit | Attending: Surgery | Admitting: Surgery

## 2017-11-19 DIAGNOSIS — S42211A Unspecified displaced fracture of surgical neck of right humerus, initial encounter for closed fracture: Secondary | ICD-10-CM | POA: Diagnosis not present

## 2017-11-19 DIAGNOSIS — S42324A Nondisplaced transverse fracture of shaft of humerus, right arm, initial encounter for closed fracture: Secondary | ICD-10-CM

## 2017-11-19 MED ORDER — GADOBENATE DIMEGLUMINE 529 MG/ML IV SOLN
6.0000 mL | Freq: Once | INTRAVENOUS | Status: AC | PRN
  Administered 2017-11-19: 6 mL via INTRAVENOUS

## 2017-11-21 ENCOUNTER — Telehealth: Payer: Self-pay | Admitting: Hematology and Oncology

## 2017-11-21 ENCOUNTER — Telehealth: Payer: Self-pay | Admitting: *Deleted

## 2017-11-21 ENCOUNTER — Encounter: Payer: Self-pay | Admitting: Hematology and Oncology

## 2017-11-21 ENCOUNTER — Inpatient Hospital Stay: Payer: Medicare Other

## 2017-11-21 ENCOUNTER — Inpatient Hospital Stay: Payer: Medicare Other | Attending: Hematology and Oncology | Admitting: Hematology and Oncology

## 2017-11-21 VITALS — BP 128/76 | HR 88 | Temp 97.5°F | Resp 18 | Ht 64.0 in | Wt 134.1 lb

## 2017-11-21 DIAGNOSIS — Z8572 Personal history of non-Hodgkin lymphomas: Secondary | ICD-10-CM

## 2017-11-21 DIAGNOSIS — C8330 Diffuse large B-cell lymphoma, unspecified site: Secondary | ICD-10-CM | POA: Insufficient documentation

## 2017-11-21 DIAGNOSIS — M84521A Pathological fracture in neoplastic disease, right humerus, initial encounter for fracture: Secondary | ICD-10-CM | POA: Diagnosis not present

## 2017-11-21 DIAGNOSIS — C8208 Follicular lymphoma grade I, lymph nodes of multiple sites: Secondary | ICD-10-CM

## 2017-11-21 DIAGNOSIS — E559 Vitamin D deficiency, unspecified: Secondary | ICD-10-CM

## 2017-11-21 DIAGNOSIS — Z9221 Personal history of antineoplastic chemotherapy: Secondary | ICD-10-CM

## 2017-11-21 DIAGNOSIS — G893 Neoplasm related pain (acute) (chronic): Secondary | ICD-10-CM | POA: Insufficient documentation

## 2017-11-21 DIAGNOSIS — E790 Hyperuricemia without signs of inflammatory arthritis and tophaceous disease: Secondary | ICD-10-CM | POA: Insufficient documentation

## 2017-11-21 DIAGNOSIS — M8450XA Pathological fracture in neoplastic disease, unspecified site, initial encounter for fracture: Secondary | ICD-10-CM

## 2017-11-21 LAB — CBC WITH DIFFERENTIAL/PLATELET
BASOS ABS: 0.1 10*3/uL (ref 0.0–0.1)
BASOS PCT: 1 %
EOS ABS: 0.6 10*3/uL — AB (ref 0.0–0.5)
Eosinophils Relative: 8 %
HEMATOCRIT: 38.7 % (ref 34.8–46.6)
Hemoglobin: 13.1 g/dL (ref 11.6–15.9)
Lymphocytes Relative: 12 %
Lymphs Abs: 1 10*3/uL (ref 0.9–3.3)
MCH: 32.4 pg (ref 25.1–34.0)
MCHC: 33.9 g/dL (ref 31.5–36.0)
MCV: 95.4 fL (ref 79.5–101.0)
MONO ABS: 0.8 10*3/uL (ref 0.1–0.9)
Monocytes Relative: 11 %
NEUTROS ABS: 5.3 10*3/uL (ref 1.5–6.5)
NEUTROS PCT: 68 %
Platelets: 357 10*3/uL (ref 145–400)
RBC: 4.05 MIL/uL (ref 3.70–5.45)
RDW: 13 % (ref 11.2–14.5)
WBC: 7.8 10*3/uL (ref 3.9–10.3)

## 2017-11-21 LAB — COMPREHENSIVE METABOLIC PANEL WITH GFR
ALT: 42 U/L (ref 0–55)
AST: 59 U/L — ABNORMAL HIGH (ref 5–34)
Albumin: 3.7 g/dL (ref 3.5–5.0)
Alkaline Phosphatase: 161 U/L — ABNORMAL HIGH (ref 40–150)
Anion gap: 10 (ref 3–11)
BUN: 21 mg/dL (ref 7–26)
CO2: 32 mmol/L — ABNORMAL HIGH (ref 22–29)
Calcium: 10.9 mg/dL — ABNORMAL HIGH (ref 8.4–10.4)
Chloride: 101 mmol/L (ref 98–109)
Creatinine, Ser: 1.02 mg/dL (ref 0.60–1.10)
GFR calc Af Amer: 60 mL/min — ABNORMAL LOW
GFR calc non Af Amer: 52 mL/min — ABNORMAL LOW
Glucose, Bld: 82 mg/dL (ref 70–140)
Potassium: 4.3 mmol/L (ref 3.5–5.1)
Sodium: 143 mmol/L (ref 136–145)
Total Bilirubin: 0.5 mg/dL (ref 0.2–1.2)
Total Protein: 6.6 g/dL (ref 6.4–8.3)

## 2017-11-21 LAB — LACTATE DEHYDROGENASE: LDH: 390 U/L — ABNORMAL HIGH (ref 125–245)

## 2017-11-21 LAB — URIC ACID: URIC ACID, SERUM: 11.4 mg/dL — AB (ref 2.6–7.4)

## 2017-11-21 LAB — SEDIMENTATION RATE: SED RATE: 40 mm/h — AB (ref 0–22)

## 2017-11-21 MED ORDER — HYDROCODONE-ACETAMINOPHEN 5-325 MG PO TABS: 1.0000 | ORAL_TABLET | ORAL | 0 refills | Status: DC | PRN

## 2017-11-21 MED ORDER — ONDANSETRON HCL 8 MG PO TABS: 8.0000 mg | ORAL_TABLET | Freq: Three times a day (TID) | ORAL | 3 refills | Status: DC | PRN

## 2017-11-21 NOTE — Telephone Encounter (Signed)
Scheduled appt per 2/11 sch msg - left voicemail with patient regarding appts.

## 2017-11-21 NOTE — Telephone Encounter (Signed)
Notified of message below. Verbalized understanding 

## 2017-11-21 NOTE — Assessment & Plan Note (Signed)
The patient has completed mainteenance rituximab in 2015 She has no evidence of cancer recurrence until she developed a pathological fracture involving her right humerus I recommend PET/CT scan for complete staging and some baseline blood work today Depending on disease burden, I will decide whether she would benefit from systemic chemotherapy versus immunotherapy She would benefit from repeat biopsy to exclude transformation to high-grade lymphoma The patient's husband requested a second opinion from a different orthopedic surgery group.  I will try to refer her urgently. I will see her back next week for further discussion and review of test results

## 2017-11-21 NOTE — Telephone Encounter (Signed)
LM with appt for PET scan tomorrow, Tuesday 2/12 @ 0830. [facility]. NPO after MN. Requested return call to confirm

## 2017-11-21 NOTE — Assessment & Plan Note (Signed)
The patient is currently placed on a support sling She has persistent pain. I refilled her prescription hydrocodone today She is aware of risk of sedation, nausea and constipation I have also given her prescription of antiemetics to take as needed and recommend she take laxatives on a regular basis I will reassess pain control in her next visit

## 2017-11-21 NOTE — Telephone Encounter (Signed)
Gave patient AVs and calendar of upcoming February appointments.  °

## 2017-11-21 NOTE — Telephone Encounter (Signed)
appt confirmed

## 2017-11-21 NOTE — Telephone Encounter (Signed)
-----   Message from [name], MD sent at 11/21/2017  2:08 PM EST ----- Regarding: hypercalcemia She has elevated calcium Increase oral intake for now Hold vitamin D supplement ----- Message ----- From: Interface, Lab In [place] Sent: 11/21/2017   1:39 PM To: [name], MD

## 2017-11-21 NOTE — Progress Notes (Signed)
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 She was treated with chlorambucil in 2000.   --2007 - 09/2008.  Treated with single agent Rituxan.   --12/2008.  She had progressive adenopathy in chest and abdomen and repeat biopsy showed low grade follicular and diffuse B cell NHL.   --01/2009 - 07/2009. She required left ureteral stent and was treated with Treanda/Rituxan x 5 cycles from 02-2009 thru 06-2009.       11/20/2008 Imaging    Mildly displaced and angulated pathologic fracture of the proximal humeral shaft. 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       09/16/2014 Imaging    No lymphadenopathy in the chest, abdomen, or pelvis. The small right axillary lymph nodes seen on the previous exam are unchanged in the 18 month interval. Small to moderate hiatal hernia.       INTERVAL HISTORY: Please see below for problem oriented charting. Her husband requested urgent visit The patient has been complaining of right elbow pain for several months. Last week, she complained of severe pain after she lifted her dog, complicated by a sound of break of the right humerus.  She went to the emergency department for evaluation. She was subsequently referred to see orthopedic surgery and MRI that was done last week and reviewed pathological fracture with diffuse tumor infiltration in the right humerus shaft Her pain is well controlled with hydrocodone as needed, although she did complain of some mild constipation and nausea.  Her  husband tried to reduce the dose of the hydrocodone by half She denies recent anorexia, weight loss or night sweats No new lymphadenopathy Denies recent infection  REVIEW OF SYSTEMS:   Constitutional: Denies fevers, chills or abnormal weight loss Eyes: Denies blurriness of vision Ears, nose, mouth, throat, and face: Denies mucositis or sore throat Respiratory: Denies cough, dyspnea or wheezes Cardiovascular: Denies palpitation, chest discomfort or lower extremity swelling Gastrointestinal:  Denies nausea, heartburn or change in bowel habits Skin: Denies abnormal skin rashes Lymphatics: Denies new lymphadenopathy or easy bruising Neurological:Denies numbness, tingling or new weaknesses Behavioral/Psych: Mood is stable, no new changes  All other systems were reviewed with the patient and are negative.  I have reviewed the past medical history, past surgical history, social history and family history with the patient and they are unchanged from previous note.  ALLERGIES:  is allergic to contrast media [iodinated diagnostic agents]; iohexol; povidone-iodine; penicillins; and adhesive [tape].  MEDICATIONS:  Current Outpatient Medications  Medication Sig Dispense Refill  . aspirin 81 MG tablet Take 81 mg by mouth daily.    . Cholecalciferol 2000 UNITS TABS Take 1 tablet by mouth daily.      . citalopram (CELEXA) 40 MG tablet Take 1 tablet (40 mg total) by mouth daily. 90 tablet 1  . fluticasone furoate-vilanterol (BREO ELLIPTA) 100-25 MCG/INH AEPB Inhale 1 puff into the lungs every morning.    . furosemide (LASIX) 20 MG tablet Take one tablet by mouth every 3 days as needed for swelling. (Patient taking differently: Take 20 mg by mouth every other day. Take  one tablet by mouth every 3 days as needed for swelling.) 90 tablet 3  . HYDROcodone-acetaminophen (NORCO/VICODIN) 5-325 MG tablet Take 1 tablet by mouth every 4 (four) hours as needed for moderate pain. 60 tablet 0  . naproxen (NAPROSYN)  250 MG tablet Take 1 tablet (250 mg total) by mouth 2 (two) times daily with a meal. 20 tablet 0  . ondansetron (ZOFRAN) 8 MG tablet Take 1 tablet (8 mg total) by mouth every 8 (eight) hours as needed for nausea. 30 tablet 3  . pantoprazole (PROTONIX) 40 MG tablet Take 1 tablet (40 mg total) by mouth daily. 90 tablet 3   No current facility-administered medications for this visit.     PHYSICAL EXAMINATION: ECOG PERFORMANCE STATUS: 1 - Symptomatic but completely ambulatory  Vitals:   11/21/17 1225  BP: 128/76  Pulse: 88  Resp: 18  Temp: (!) 97.5 F (36.4 C)  SpO2: 95%   Filed Weights   11/21/17 1225  Weight: 134 lb 1.6 oz (60.8 kg)    GENERAL:alert, no distress and comfortable SKIN: skin color, texture, turgor are normal, no rashes or significant lesions EYES: normal, Conjunctiva are pink and non-injected, sclera clear OROPHARYNX:no exudate, no erythema and lips, buccal mucosa, and tongue normal  NECK: supple, thyroid normal size, non-tender, without nodularity LYMPH:  no palpable lymphadenopathy in the cervical, axillary or inguinal LUNGS: clear to auscultation and percussion with normal breathing effort HEART: regular rate & rhythm and no murmurs and no lower extremity edema ABDOMEN:abdomen soft, non-tender and normal bowel sounds Musculoskeletal:no cyanosis of digits and no clubbing. Limited mobility of the right hand due to a shoulder sling  NEURO: alert & oriented x 3 with fluent speech, no focal motor/sensory deficits  LABORATORY DATA:  I have reviewed the data as listed    Component Value Date/Time   NA 141 05/24/2017 0842   NA 142 12/28/2016 1002   K 4.6 05/24/2017 0842   K 4.6 12/28/2016 1002   CL 101 05/24/2017 0842   CL 101 03/28/2013 0826   CO2 31 05/24/2017 0842   CO2 29 12/28/2016 1002   GLUCOSE 86 05/24/2017 0842   GLUCOSE 85 12/28/2016 1002   GLUCOSE 84 03/28/2013 0826   BUN 15 05/24/2017 0842   BUN 20.3 12/28/2016 1002   CREATININE 0.84 05/24/2017  0842   CREATININE 0.9 12/28/2016 1002   CALCIUM 10.2 05/24/2017 0842   CALCIUM 10.1 12/28/2016 1002   PROT 5.9 (L) 05/24/2017 0842   PROT 6.8 12/28/2016 1002   ALBUMIN 4.6 05/24/2017 0842   ALBUMIN 4.2 12/28/2016 1002   AST 22 05/24/2017 0842   AST 20 12/28/2016 1002   ALT 12 05/24/2017 0842   ALT 12 12/28/2016 1002   ALKPHOS 97 05/24/2017 0842   ALKPHOS 111 12/28/2016 1002   BILITOT 0.7 05/24/2017 0842   BILITOT 0.55 12/28/2016 1002   GFRNONAA 80.06 05/22/2010 1106   GFRAA  03/27/2009 1035    >60        The eGFR has been calculated using the MDRD equation. This calculation has not been validated in all clinical situations. eGFR's persistently <60 mL/min signify possible Chronic Kidney Disease.    No results found for: SPEP, UPEP  Lab Results  Component Value Date   WBC 7.5 10/12/2017   NEUTROABS 4.8 10/12/2017   HGB 13.8 10/12/2017   HCT 41.5 10/12/2017   MCV 98.2 10/12/2017   PLT 336.0 10/12/2017      Chemistry      Component Value Date/Time  NA 141 05/24/2017 0842   NA 142 12/28/2016 1002   K 4.6 05/24/2017 0842   K 4.6 12/28/2016 1002   CL 101 05/24/2017 0842   CL 101 03/28/2013 0826   CO2 31 05/24/2017 0842   CO2 29 12/28/2016 1002   BUN 15 05/24/2017 0842   BUN 20.3 12/28/2016 1002   CREATININE 0.84 05/24/2017 0842   CREATININE 0.9 12/28/2016 1002      Component Value Date/Time   CALCIUM 10.2 05/24/2017 0842   CALCIUM 10.1 12/28/2016 1002   ALKPHOS 97 05/24/2017 0842   ALKPHOS 111 12/28/2016 1002   AST 22 05/24/2017 0842   AST 20 12/28/2016 1002   ALT 12 05/24/2017 0842   ALT 12 12/28/2016 1002   BILITOT 0.7 05/24/2017 0842   BILITOT 0.55 12/28/2016 1002       RADIOGRAPHIC STUDIES: I have reviewed imaging study with the patient and her husband I have personally reviewed the radiological images as listed and agreed with the findings in the report. Dg Shoulder Right  Result Date: 11/15/2017 CLINICAL DATA:  Left arm pain for the past 3  weeks.  No injury. EXAM: RIGHT SHOULDER - 2+ VIEW; RIGHT HUMERUS - 2+ VIEW COMPARISON:  None. FINDINGS: There is a acute, oblique fracture through the proximal humeral diaphysis with minimal lateral and posterior displacement. No additional fracture seen. Mild acromioclavicular osteoarthritis. Diffuse osteopenia. IMPRESSION: 1. Acute, minimally displaced fracture of the proximal humeral diaphysis. Electronically Signed   By: Titus Dubin M.D.   On: 11/15/2017 10:16   Mr Humerus Right W Wo Contrast  Result Date: 11/20/2017 CLINICAL DATA:  Nontraumatic fracture of the right humerus. History of lymphoma. Creatinine was obtained on site at [facility] at [address] Results: Creatinine 1.2 mg/dL. EXAM: MRI OF THE RIGHT HUMERUS WITHOUT AND WITH CONTRAST TECHNIQUE: Multiplanar, multisequence MR imaging of the right upper extremity was performed before and after the administration of intravenous contrast. CONTRAST:  62m MULTIHANCE GADOBENATE DIMEGLUMINE 529 MG/ML IV SOLN COMPARISON:  Radiographs 11/15/2017 FINDINGS: As demonstrated on the radiographs there is a displaced fracture involving the proximal humeral diaphysis with slight angulation. The marrow is completely replaced with low T1 and heterogeneous high T2 signal intensity most likely osseous lymphoma. This also demonstrates diffuse enhancement. The epiphyseal regions are spared. Edema/inflammation/hemorrhage noted surrounding the fracture and in the musculature and in the subcutaneous tissues. The visualized scapula and clavicle are unremarkable. No obvious rib lesions. IMPRESSION: Mildly displaced and angulated pathologic fracture of the proximal humeral shaft. 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 Electronically Signed   By: PMarijo SanesM.D.   On: 11/20/2017 08:17   Dg Humerus Right  Result Date: 11/15/2017 CLINICAL DATA:  Left arm pain for the past 3 weeks.  No injury. EXAM: RIGHT SHOULDER - 2+  VIEW; RIGHT HUMERUS - 2+ VIEW COMPARISON:  None. FINDINGS: There is a acute, oblique fracture through the proximal humeral diaphysis with minimal lateral and posterior displacement. No additional fracture seen. Mild acromioclavicular osteoarthritis. Diffuse osteopenia. IMPRESSION: 1. Acute, minimally displaced fracture of the proximal humeral diaphysis. Electronically Signed   By: WTitus DubinM.D.   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  I will try to refer her urgently. 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 I refilled her prescription hydrocodone today She is aware of risk of sedation, nausea and constipation I have also given her prescription of antiemetics to take as needed and recommend she take laxatives on a regular basis I will reassess pain control in her next visit   Orders Placed This Encounter  Procedures  . NM PET Image Restag (PS) Skull Base To Thigh    Standing Status:   Future    Standing Expiration Date:   11/21/2018    Order Specific Question:   If indicated for the ordered procedure, I authorize the administration of a radiopharmaceutical per Radiology protocol    Answer:   Yes     Order Specific Question:   Preferred imaging location?    Answer:   [facility]    Order Specific Question:   Radiology Contrast Protocol - do NOT remove file path    Answer:   [email or web address]  . Comprehensive metabolic panel    Standing Status:   Future    Standing Expiration Date:   12/26/2018  . CBC with Differential/Platelet    Standing Status:   Future    Standing Expiration Date:   12/26/2018  . Lactate dehydrogenase    Standing Status:   Future    Standing Expiration Date:   12/26/2018  . VITAMIN D 25 Hydroxy (Vit-D Deficiency, Fractures)    Standing Status:   Future    Standing Expiration Date:   12/26/2018  . Hepatitis B core antibody, IgM    Standing Status:   Future    Standing Expiration Date:   12/26/2018  . Hepatitis B surface antibody    Standing Status:   Future    Standing Expiration Date:   12/26/2018  . Hepatitis B surface antigen    Standing Status:   Future    Standing Expiration Date:   12/26/2018  . Uric acid    Standing Status:   Future    Standing Expiration Date:   12/26/2018  . Sedimentation rate    Standing Status:   Future    Standing Expiration Date:   12/26/2018  . Ambulatory referral to Orthopedic Surgery    Referral Priority:   Urgent    Referral Type:   Surgical    Referral Reason:   Second Opinion    Referred to Provider:   Earlie Server, MD    Requested Specialty:   Orthopedic Surgery    Number of Visits Requested:   1   All questions were answered. The patient knows to call the clinic with any problems, questions or concerns. No barriers to learning was detected. I spent 40 minutes counseling the patient face to face. The total time spent in the appointment was 55 minutes and more than 50% was on counseling and review of test results     [name], MD 11/21/2017 1:08 PM

## 2017-11-22 ENCOUNTER — Encounter: Payer: Self-pay | Admitting: Hematology and Oncology

## 2017-11-22 ENCOUNTER — Inpatient Hospital Stay (HOSPITAL_BASED_OUTPATIENT_CLINIC_OR_DEPARTMENT_OTHER): Payer: Medicare Other | Admitting: Hematology and Oncology

## 2017-11-22 ENCOUNTER — Telehealth: Payer: Self-pay

## 2017-11-22 ENCOUNTER — Ambulatory Visit (HOSPITAL_COMMUNITY)
Admission: RE | Admit: 2017-11-22 | Discharge: 2017-11-22 | Disposition: A | Discharge status: Home / Self Care | Payer: Medicare Other | Source: Ambulatory Visit | Attending: Hematology and Oncology | Admitting: Hematology and Oncology

## 2017-11-22 DIAGNOSIS — N2889 Other specified disorders of kidney and ureter: Secondary | ICD-10-CM | POA: Insufficient documentation

## 2017-11-22 DIAGNOSIS — Z9221 Personal history of antineoplastic chemotherapy: Secondary | ICD-10-CM | POA: Diagnosis not present

## 2017-11-22 DIAGNOSIS — G893 Neoplasm related pain (acute) (chronic): Secondary | ICD-10-CM | POA: Diagnosis not present

## 2017-11-22 DIAGNOSIS — M84521A Pathological fracture in neoplastic disease, right humerus, initial encounter for fracture: Secondary | ICD-10-CM

## 2017-11-22 DIAGNOSIS — M8450XA Pathological fracture in neoplastic disease, unspecified site, initial encounter for fracture: Secondary | ICD-10-CM

## 2017-11-22 DIAGNOSIS — Z8572 Personal history of non-Hodgkin lymphomas: Secondary | ICD-10-CM

## 2017-11-22 DIAGNOSIS — R160 Hepatomegaly, not elsewhere classified: Secondary | ICD-10-CM

## 2017-11-22 DIAGNOSIS — R591 Generalized enlarged lymph nodes: Secondary | ICD-10-CM | POA: Insufficient documentation

## 2017-11-22 DIAGNOSIS — E790 Hyperuricemia without signs of inflammatory arthritis and tophaceous disease: Secondary | ICD-10-CM | POA: Diagnosis not present

## 2017-11-22 DIAGNOSIS — C8208 Follicular lymphoma grade I, lymph nodes of multiple sites: Secondary | ICD-10-CM | POA: Diagnosis not present

## 2017-11-22 DIAGNOSIS — C8580 Other specified types of non-Hodgkin lymphoma, unspecified site: Secondary | ICD-10-CM | POA: Diagnosis not present

## 2017-11-22 LAB — GLUCOSE, CAPILLARY: Glucose-Capillary: 95 mg/dL (ref 65–99)

## 2017-11-22 LAB — VITAMIN D 25 HYDROXY (VIT D DEFICIENCY, FRACTURES): Vit D, 25-Hydroxy: 31.7 ng/mL (ref 30.0–100.0)

## 2017-11-22 LAB — HEPATITIS B CORE ANTIBODY, IGM: Hep B C IgM: NEGATIVE

## 2017-11-22 LAB — HEPATITIS B SURFACE ANTIBODY,QUALITATIVE: HEP B S AB: NONREACTIVE

## 2017-11-22 LAB — HEPATITIS B SURFACE ANTIGEN: Hepatitis B Surface Ag: NEGATIVE

## 2017-11-22 MED ORDER — FLUDEOXYGLUCOSE F - 18 (FDG) INJECTION
6.7000 | Freq: Once | INTRAVENOUS | Status: AC | PRN
  Administered 2017-11-22: 6.7 via INTRAVENOUS

## 2017-11-22 NOTE — Assessment & Plan Note (Signed)
I have reviewed her blood work and PET CT scan with the patient and her husband I suspect she might have malignant transformation to high-grade lymphoma, possibly due to diffuse large B-cell lymphoma She has cancer pain, hypercalcemia, high uric acid level and significant disease burden She also have pathological fracture that has not been surgically fixed. I do not believe she can wait for outpatient workup I recommend admission to the hospital tomorrow for urgent orthopedic surgery consultation, echocardiogram in anticipation for Adriamycin-containing lymphoma regimen, port placement, possible lymph node biopsy, hydration, treatment of hypercalcemia and high uric acid level If we can get the biopsy results available by the end of the week, she can be started with treatment as soon as possible I spent a lot of time educating the patient and her husband the rationale behind urgent evaluation and hospitalization and they agreed to proceed

## 2017-11-22 NOTE — Assessment & Plan Note (Signed)
She has significant hyperuricemia, high LDH and at risks of tumor lysis syndrome I will hydrate her after hospitalization and will put her on rasburicase

## 2017-11-22 NOTE — Telephone Encounter (Signed)
Spoke with Shirlean Mylar in patient placement and made bed reservation for Ms Aune to be admitted 11-23-17  In the am per Dr. 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 Malignant hypercalcemia.  Spoke with Maudie Mercury ([employer] operator) and requested that a bed be reserved for Ms Mireles for tomorrow am if available.

## 2017-11-22 NOTE — Assessment & Plan Note (Signed)
PET/CT scan shows significant disease burden I am concerned about possible fat embolism with untreated fracture I would get her admitted tomorrow for urgent orthopedic consultation If orthopedic surgeon will take her to the OR, we will get sample through her biopsy site at the bone If orthopedic surgeon declined surgery, I will have to get interventional radiologist to get lymph node biopsy near her sternum bone

## 2017-11-22 NOTE — Assessment & Plan Note (Addendum)
She has poorly controlled pain She will continue hydrocodone for now I will admit her tomorrow for cancer pain management As soon as biopsies are obtained, I will start her on high-dose steroids

## 2017-11-22 NOTE — Progress Notes (Signed)
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 She was treated with chlorambucil in 2000.   --2007 - 09/2008.  Treated with single agent Rituxan.   --12/2008.  She had progressive adenopathy in chest and abdomen and repeat biopsy showed low grade follicular and diffuse B cell NHL.   --01/2009 - 07/2009. She required left ureteral stent and was treated with Treanda/Rituxan x 5 cycles from 02-2009 thru 06-2009.       11/20/2008 Imaging    Mildly displaced and angulated pathologic fracture of the proximal humeral shaft. 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       09/16/2014 Imaging    No lymphadenopathy in the chest, abdomen, or pelvis. The small right axillary lymph nodes seen on the previous exam are unchanged in the 18 month interval. Small to moderate hiatal hernia.      11/22/2017 PET scan    New hypermetabolic lymphadenopathy within the chest and abdomen, consistent with recurrent lymphoma. New large hypermetabolic mass in the right hepatic lobe, consistent with lymphoma this involvement. New small hypermetabolic mass in the left kidney, which may be due to lymphomatous involvement although primary renal cell carcinoma cannot be excluded. Hypermetabolic mass in the right humerus with pathologic fracture, consistent with lymphomatous involvement. Small hypermetabolic foci in right posterior chest wall, also suspicious for recurrent lymphoma.       INTERVAL HISTORY: Please see below for problem oriented charting. She  returns with her husband for further workup and follow-up She continues to have significant pain in her right humerus area Pain medicine make her sleepy Her appetite is poor She denies abdominal pain  REVIEW OF SYSTEMS:   Constitutional: Denies fevers, chills or abnormal weight loss Eyes: Denies blurriness of vision Ears, nose, mouth, throat, and face: Denies mucositis or sore throat Respiratory: Denies cough, dyspnea or wheezes Cardiovascular: Denies palpitation, chest discomfort or lower extremity swelling Gastrointestinal:  Denies nausea, heartburn or change in bowel habits Skin: Denies abnormal skin rashes Lymphatics: Denies new lymphadenopathy or easy bruising Neurological:Denies numbness, tingling or new weaknesses Behavioral/Psych: Mood is stable, no new changes  All other systems were reviewed with the patient and are negative.  I have reviewed the past medical history, past surgical history, social history and family history with the patient and they are unchanged from previous note.  ALLERGIES:  is allergic to contrast media [iodinated diagnostic agents]; iohexol; povidone-iodine; penicillins; and adhesive [tape].  MEDICATIONS:  Current Outpatient Medications  Medication Sig Dispense Refill  . aspirin 81 MG tablet Take 81 mg by mouth daily.    . Cholecalciferol 2000 UNITS TABS Take 1 tablet by mouth daily.      . citalopram (CELEXA) 40 MG tablet Take 1 tablet (40 mg total) by mouth daily. 90 tablet 1  . fluticasone furoate-vilanterol (BREO ELLIPTA) 100-25 MCG/INH AEPB Inhale 1 puff into the lungs every morning.    Marland Kitchen HYDROcodone-acetaminophen (NORCO/VICODIN) 5-325 MG tablet Take 1 tablet by mouth every 4 (four) hours as needed for moderate pain. 60 tablet 0  . ondansetron (ZOFRAN) 8 MG tablet Take 1 tablet (  8 mg total) by mouth every 8 (eight) hours as needed for nausea. 30 tablet 3  . pantoprazole (PROTONIX) 40 MG tablet Take 1 tablet (40 mg total) by mouth daily. 90 tablet  3   No current facility-administered medications for this visit.     PHYSICAL EXAMINATION: ECOG PERFORMANCE STATUS: 2 - Symptomatic, <50% confined to bed  Vitals:   11/22/17 1351  BP: 112/71  Pulse: (!) 112  Resp: 18  Temp: 97.9 F (36.6 C)  SpO2: 90%   Filed Weights   11/22/17 1351  Weight: 133 lb 1.6 oz (60.4 kg)    GENERAL:alert, no distress and comfortable.  She looks elderly and frail SKIN: skin color, texture, turgor are normal, no rashes or significant lesions EYES: normal, Conjunctiva are pink and non-injected, sclera clear OROPHARYNX:no exudate, no erythema and lips, buccal mucosa, and tongue normal  NECK: supple, thyroid normal size, non-tender, without nodularity LYMPH:  no palpable lymphadenopathy in the cervical, axillary or inguinal LUNGS: clear to auscultation and percussion with normal breathing effort HEART: regular rate & rhythm and no murmurs and no lower extremity edema ABDOMEN:abdomen soft, non-tender and normal bowel sounds Musculoskeletal:no cyanosis of digits and no clubbing.  I am not able to examine her right humerus area due to the sling NEURO: alert & oriented x 3 with fluent speech, no focal motor/sensory deficits  LABORATORY DATA:  I have reviewed the data as listed    Component Value Date/Time   NA 143 11/21/2017 1321   NA 142 12/28/2016 1002   K 4.3 11/21/2017 1321   K 4.6 12/28/2016 1002   CL 101 11/21/2017 1321   CL 101 03/28/2013 0826   CO2 32 (H) 11/21/2017 1321   CO2 29 12/28/2016 1002   GLUCOSE 82 11/21/2017 1321   GLUCOSE 85 12/28/2016 1002   GLUCOSE 84 03/28/2013 0826   BUN 21 11/21/2017 1321   BUN 20.3 12/28/2016 1002   CREATININE 1.02 11/21/2017 1321   CREATININE 0.9 12/28/2016 1002   CALCIUM 10.9 (H) 11/21/2017 1321   CALCIUM 10.1 12/28/2016 1002   PROT 6.6 11/21/2017 1321   PROT 6.8 12/28/2016 1002   ALBUMIN 3.7 11/21/2017 1321   ALBUMIN 4.2 12/28/2016 1002   AST 59 (H) 11/21/2017 1321   AST 20 12/28/2016 1002    ALT 42 11/21/2017 1321   ALT 12 12/28/2016 1002   ALKPHOS 161 (H) 11/21/2017 1321   ALKPHOS 111 12/28/2016 1002   BILITOT 0.5 11/21/2017 1321   BILITOT 0.55 12/28/2016 1002   GFRNONAA 52 (L) 11/21/2017 1321   GFRAA 60 (L) 11/21/2017 1321    No results found for: SPEP, UPEP  Lab Results  Component Value Date   WBC 7.8 11/21/2017   NEUTROABS 5.3 11/21/2017   HGB 13.1 11/21/2017   HCT 38.7 11/21/2017   MCV 95.4 11/21/2017   PLT 357 11/21/2017      Chemistry      Component Value Date/Time   NA 143 11/21/2017 1321   NA 142 12/28/2016 1002   K 4.3 11/21/2017 1321   K 4.6 12/28/2016 1002   CL 101 11/21/2017 1321   CL 101 03/28/2013 0826   CO2 32 (H) 11/21/2017 1321   CO2 29 12/28/2016 1002   BUN 21 11/21/2017 1321   BUN 20.3 12/28/2016 1002   CREATININE 1.02 11/21/2017 1321   CREATININE 0.9 12/28/2016 1002      Component Value Date/Time   CALCIUM 10.9 (H) 11/21/2017 1321   CALCIUM 10.1 12/28/2016 1002   ALKPHOS  161 (H) 11/21/2017 1321   ALKPHOS 111 12/28/2016 1002   AST 59 (H) 11/21/2017 1321   AST 20 12/28/2016 1002   ALT 42 11/21/2017 1321   ALT 12 12/28/2016 1002   BILITOT 0.5 11/21/2017 1321   BILITOT 0.55 12/28/2016 1002       RADIOGRAPHIC STUDIES: I have reviewed imaging study with the patient and her husband I have personally reviewed the radiological images as listed and agreed with the findings in the report. Dg Shoulder Right  Result Date: 11/15/2017 CLINICAL DATA:  Left arm pain for the past 3 weeks.  No injury. EXAM: RIGHT SHOULDER - 2+ VIEW; RIGHT HUMERUS - 2+ VIEW COMPARISON:  None. FINDINGS: There is a acute, oblique fracture through the proximal humeral diaphysis with minimal lateral and posterior displacement. No additional fracture seen. Mild acromioclavicular osteoarthritis. Diffuse osteopenia. IMPRESSION: 1. Acute, minimally displaced fracture of the proximal humeral diaphysis. Electronically Signed   By: Titus Dubin M.D.   On: 11/15/2017 10:16    Mr Humerus Right W Wo Contrast  Result Date: 11/20/2017 CLINICAL DATA:  Nontraumatic fracture of the right humerus. History of lymphoma. Creatinine was obtained on site at [facility] at [address] Results: Creatinine 1.2 mg/dL. EXAM: MRI OF THE RIGHT HUMERUS WITHOUT AND WITH CONTRAST TECHNIQUE: Multiplanar, multisequence MR imaging of the right upper extremity was performed before and after the administration of intravenous contrast. CONTRAST:  33mL MULTIHANCE GADOBENATE DIMEGLUMINE 529 MG/ML IV SOLN COMPARISON:  Radiographs 11/15/2017 FINDINGS: As demonstrated on the radiographs there is a displaced fracture involving the proximal humeral diaphysis with slight angulation. The marrow is completely replaced with low T1 and heterogeneous high T2 signal intensity most likely osseous lymphoma. This also demonstrates diffuse enhancement. The epiphyseal regions are spared. Edema/inflammation/hemorrhage noted surrounding the fracture and in the musculature and in the subcutaneous tissues. The visualized scapula and clavicle are unremarkable. No obvious rib lesions. IMPRESSION: Mildly displaced and angulated pathologic fracture of the proximal humeral shaft. 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 Electronically Signed   By: Marijo Sanes M.D.   On: 11/20/2017 08:17   Nm Pet Image Restag (ps) Skull Base To Thigh  Result Date: 11/22/2017 CLINICAL DATA:  Subsequent treatment strategy for non-Hodgkin lymphoma. EXAM: NUCLEAR MEDICINE PET SKULL BASE TO THIGH TECHNIQUE: 6.7 mCi F-18 FDG was injected intravenously. Full-ring PET imaging was performed from the skull base to thigh after the radiotracer. CT data was obtained and used for attenuation correction and anatomic localization. FASTING BLOOD GLUCOSE:  Value: 95 mg/dl COMPARISON:  CT on 09/16/2014 and PET-CT on 09/09/2009 FINDINGS: NECK:  No hypermetabolic lymph nodes or masses. CHEST: New Sub-cm hypermetabolic  lymph nodes are seen in the right internal mammary chain and along the posterior wall the right mainstem bronchus, with SUV max of 11.1. New hypermetabolic activity is also seen in the right hilum, with SUV max of 5.7. No suspicious pulmonary nodules are seen on CT images. ABDOMEN/PELVIS: New large hypermetabolic mass is seen in the posterior right hepatic lobe with SUV max of 33.5. No evidence of splenic involvement. A focal hypermetabolic lesion is seen lateral midpole of the left kidney which has SUV max of 18.5. This is suspicious for renal involvement by lymphoma, however primary renal cell carcinoma cannot be excluded. Hypermetabolic lymphadenopathy is seen in the right cardiophrenic angle, porta hepatis, and abdominal retroperitoneum in the retrocaval, aortocaval and left paraaortic spaces. Index lymphadenopathy in the porta hepatis measures 3.7 cm in short axis on  image 103/4, with SUV max of 30.3. No pelvic lymphadenopathy identified. Small hiatal hernia again noted. SKELETON: Hypermetabolic mass is seen at site of pathologic fracture in the proximal humeral diaphysis, which has SUV max of 17.6. A sub-cm hypermetabolic lymph node is seen along the lateral aspect of this mass in the adjacent subcutaneous tissues with SUV max of 5.7. Small foci of hypermetabolic activity are also seen in the right posterior chest wall in the intercostal space between the right posterior 10th and 11th ribs. IMPRESSION: New hypermetabolic lymphadenopathy within the chest and abdomen, consistent with recurrent lymphoma. New large hypermetabolic mass in the right hepatic lobe, consistent with lymphoma this involvement. New small hypermetabolic mass in the left kidney, which may be due to lymphomatous involvement although primary renal cell carcinoma cannot be excluded. Hypermetabolic mass in the right humerus with pathologic fracture, consistent with lymphomatous involvement. Small hypermetabolic foci in right posterior chest  wall, also suspicious for recurrent lymphoma. Electronically Signed   By: Earle Gell M.D.   On: 11/22/2017 10:47   Dg Humerus Right  Result Date: 11/15/2017 CLINICAL DATA:  Left arm pain for the past 3 weeks.  No injury. EXAM: RIGHT SHOULDER - 2+ VIEW; RIGHT HUMERUS - 2+ VIEW COMPARISON:  None. FINDINGS: There is a acute, oblique fracture through the proximal humeral diaphysis with minimal lateral and posterior displacement. No additional fracture seen. Mild acromioclavicular osteoarthritis. Diffuse osteopenia. IMPRESSION: 1. Acute, minimally displaced fracture of the proximal humeral diaphysis. Electronically Signed   By: Titus Dubin M.D.   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 I do not believe she can wait for outpatient workup I recommend admission to the hospital tomorrow for urgent orthopedic surgery consultation, echocardiogram in anticipation for Adriamycin-containing lymphoma regimen, port placement, possible lymph node biopsy, hydration, treatment of hypercalcemia and high uric acid level If we can get the biopsy results available by the end of the week, she can be started with treatment as soon as possible I spent a lot of time educating the patient and her husband the rationale behind urgent evaluation and hospitalization and they agreed to proceed  Pathological fracture due to neoplastic disease, initial encounter PET/CT scan shows significant disease burden I am concerned about possible fat embolism with untreated fracture I would get her admitted  tomorrow for urgent orthopedic consultation If orthopedic surgeon will take her to the OR, we will get sample through her biopsy site at the bone If orthopedic surgeon declined surgery, I will have to get interventional radiologist to get lymph node biopsy near her sternum bone  Cancer associated pain She has poorly controlled pain She will continue hydrocodone for now I will admit her tomorrow for cancer pain management As soon as biopsies are obtained, I will start her on high-dose steroids  Hyperuricemia She has significant hyperuricemia, high LDH and at risks of tumor lysis syndrome I will hydrate her after hospitalization and will put her on rasburicase  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  All questions were answered. The patient knows to call the clinic with any problems, questions or concerns. No barriers to learning was detected. I spent 40 minutes counseling the patient face to face. The total time spent in the appointment was 55 minutes and more than 50% was on counseling and review of test results     [name], MD 11/22/2017 2:52 PM

## 2017-11-22 NOTE — Assessment & Plan Note (Signed)
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

## 2017-11-23 ENCOUNTER — Other Ambulatory Visit (HOSPITAL_COMMUNITY): Payer: Medicare Other

## 2017-11-23 ENCOUNTER — Encounter (HOSPITAL_COMMUNITY)
Admission: AD | Disposition: A | Discharge status: Home Health | Payer: Self-pay | Source: Ambulatory Visit | Attending: Internal Medicine

## 2017-11-23 ENCOUNTER — Encounter (HOSPITAL_COMMUNITY): Payer: Self-pay | Admitting: Orthopedic Surgery

## 2017-11-23 ENCOUNTER — Inpatient Hospital Stay (HOSPITAL_COMMUNITY): Payer: Medicare Other

## 2017-11-23 ENCOUNTER — Inpatient Hospital Stay (HOSPITAL_COMMUNITY): Payer: Medicare Other | Admitting: Anesthesiology

## 2017-11-23 ENCOUNTER — Inpatient Hospital Stay (HOSPITAL_COMMUNITY)
Admission: AD | Admit: 2017-11-23 | Discharge: 2017-11-25 | DRG: 477 | Disposition: A | Discharge status: Home Health | Payer: Medicare Other | Source: Ambulatory Visit | Attending: Internal Medicine | Admitting: Internal Medicine

## 2017-11-23 DIAGNOSIS — C8519 Unspecified B-cell lymphoma, extranodal and solid organ sites: Secondary | ICD-10-CM | POA: Diagnosis not present

## 2017-11-23 DIAGNOSIS — Z87891 Personal history of nicotine dependence: Secondary | ICD-10-CM

## 2017-11-23 DIAGNOSIS — T148XXA Other injury of unspecified body region, initial encounter: Secondary | ICD-10-CM

## 2017-11-23 DIAGNOSIS — Z7951 Long term (current) use of inhaled steroids: Secondary | ICD-10-CM | POA: Diagnosis not present

## 2017-11-23 DIAGNOSIS — J439 Emphysema, unspecified: Secondary | ICD-10-CM | POA: Diagnosis not present

## 2017-11-23 DIAGNOSIS — G893 Neoplasm related pain (acute) (chronic): Secondary | ICD-10-CM | POA: Diagnosis not present

## 2017-11-23 DIAGNOSIS — E883 Tumor lysis syndrome: Secondary | ICD-10-CM | POA: Diagnosis not present

## 2017-11-23 DIAGNOSIS — I340 Nonrheumatic mitral (valve) insufficiency: Secondary | ICD-10-CM | POA: Diagnosis not present

## 2017-11-23 DIAGNOSIS — M8450XA Pathological fracture in neoplastic disease, unspecified site, initial encounter for fracture: Secondary | ICD-10-CM

## 2017-11-23 DIAGNOSIS — E0590 Thyrotoxicosis, unspecified without thyrotoxic crisis or storm: Secondary | ICD-10-CM | POA: Diagnosis present

## 2017-11-23 DIAGNOSIS — Z7189 Other specified counseling: Secondary | ICD-10-CM | POA: Diagnosis not present

## 2017-11-23 DIAGNOSIS — Z79899 Other long term (current) drug therapy: Secondary | ICD-10-CM

## 2017-11-23 DIAGNOSIS — Z91048 Other nonmedicinal substance allergy status: Secondary | ICD-10-CM | POA: Diagnosis not present

## 2017-11-23 DIAGNOSIS — C8298 Follicular lymphoma, unspecified, lymph nodes of multiple sites: Secondary | ICD-10-CM | POA: Diagnosis present

## 2017-11-23 DIAGNOSIS — M84521A Pathological fracture in neoplastic disease, right humerus, initial encounter for fracture: Secondary | ICD-10-CM | POA: Diagnosis not present

## 2017-11-23 DIAGNOSIS — C8580 Other specified types of non-Hodgkin lymphoma, unspecified site: Secondary | ICD-10-CM | POA: Diagnosis not present

## 2017-11-23 DIAGNOSIS — Z880 Allergy status to penicillin: Secondary | ICD-10-CM

## 2017-11-23 DIAGNOSIS — K5900 Constipation, unspecified: Secondary | ICD-10-CM | POA: Diagnosis not present

## 2017-11-23 DIAGNOSIS — Z91041 Radiographic dye allergy status: Secondary | ICD-10-CM

## 2017-11-23 DIAGNOSIS — C8208 Follicular lymphoma grade I, lymph nodes of multiple sites: Secondary | ICD-10-CM | POA: Diagnosis not present

## 2017-11-23 DIAGNOSIS — E790 Hyperuricemia without signs of inflammatory arthritis and tophaceous disease: Secondary | ICD-10-CM | POA: Diagnosis present

## 2017-11-23 DIAGNOSIS — Z883 Allergy status to other anti-infective agents status: Secondary | ICD-10-CM

## 2017-11-23 DIAGNOSIS — N2889 Other specified disorders of kidney and ureter: Secondary | ICD-10-CM | POA: Diagnosis not present

## 2017-11-23 DIAGNOSIS — Z9221 Personal history of antineoplastic chemotherapy: Secondary | ICD-10-CM | POA: Diagnosis not present

## 2017-11-23 DIAGNOSIS — C8590 Non-Hodgkin lymphoma, unspecified, unspecified site: Secondary | ICD-10-CM | POA: Diagnosis not present

## 2017-11-23 DIAGNOSIS — K740 Hepatic fibrosis: Secondary | ICD-10-CM | POA: Diagnosis not present

## 2017-11-23 DIAGNOSIS — K769 Liver disease, unspecified: Secondary | ICD-10-CM | POA: Diagnosis not present

## 2017-11-23 DIAGNOSIS — Z9189 Other specified personal risk factors, not elsewhere classified: Secondary | ICD-10-CM

## 2017-11-23 DIAGNOSIS — C8588 Other specified types of non-Hodgkin lymphoma, lymph nodes of multiple sites: Secondary | ICD-10-CM | POA: Diagnosis present

## 2017-11-23 DIAGNOSIS — Z7982 Long term (current) use of aspirin: Secondary | ICD-10-CM

## 2017-11-23 DIAGNOSIS — M84421D Pathological fracture, right humerus, subsequent encounter for fracture with routine healing: Secondary | ICD-10-CM | POA: Diagnosis not present

## 2017-11-23 DIAGNOSIS — J449 Chronic obstructive pulmonary disease, unspecified: Secondary | ICD-10-CM | POA: Diagnosis not present

## 2017-11-23 DIAGNOSIS — R160 Hepatomegaly, not elsewhere classified: Secondary | ICD-10-CM | POA: Diagnosis not present

## 2017-11-23 DIAGNOSIS — Z419 Encounter for procedure for purposes other than remedying health state, unspecified: Secondary | ICD-10-CM

## 2017-11-23 DIAGNOSIS — K5903 Drug induced constipation: Secondary | ICD-10-CM | POA: Diagnosis present

## 2017-11-23 DIAGNOSIS — K5909 Other constipation: Secondary | ICD-10-CM

## 2017-11-23 DIAGNOSIS — G8918 Other acute postprocedural pain: Secondary | ICD-10-CM | POA: Diagnosis not present

## 2017-11-23 HISTORY — PX: ORIF HUMERUS FRACTURE: SHX2126

## 2017-11-23 LAB — PROTIME-INR
INR: 0.93
Prothrombin Time: 12.4 seconds (ref 11.4–15.2)

## 2017-11-23 LAB — SURGICAL PCR SCREEN
MRSA, PCR: NEGATIVE
Staphylococcus aureus: NEGATIVE

## 2017-11-23 SURGERY — OPEN REDUCTION INTERNAL FIXATION (ORIF) HUMERAL SHAFT FRACTURE
Anesthesia: General | Site: Arm Upper | Laterality: Right

## 2017-11-23 MED ORDER — MEPERIDINE HCL 50 MG/ML IJ SOLN: 6.2500 mg | INTRAMUSCULAR | Status: DC | PRN

## 2017-11-23 MED ORDER — BUPIVACAINE-EPINEPHRINE (PF) 0.5% -1:200000 IJ SOLN
INTRAMUSCULAR | Status: DC | PRN
  Administered 2017-11-23: 10 mL

## 2017-11-23 MED ORDER — MIDAZOLAM HCL 2 MG/2ML IJ SOLN: 1.0000 mg | Freq: Once | INTRAMUSCULAR | Status: DC

## 2017-11-23 MED ORDER — ROCURONIUM BROMIDE 10 MG/ML (PF) SYRINGE
PREFILLED_SYRINGE | INTRAVENOUS | Status: AC
  Filled 2017-11-23: qty 5

## 2017-11-23 MED ORDER — VITAMIN D3 25 MCG (1000 UNIT) PO TABS
1000.0000 [IU] | ORAL_TABLET | Freq: Every day | ORAL | Status: DC
  Filled 2017-11-23 (×2): qty 1

## 2017-11-23 MED ORDER — SODIUM CHLORIDE 0.9 % IV SOLN
8.0000 mg | Freq: Three times a day (TID) | INTRAVENOUS | Status: DC | PRN
  Filled 2017-11-23: qty 4

## 2017-11-23 MED ORDER — ONDANSETRON 4 MG PO TBDP: 4.0000 mg | ORAL_TABLET | Freq: Three times a day (TID) | ORAL | Status: DC | PRN

## 2017-11-23 MED ORDER — EPHEDRINE SULFATE 50 MG/ML IJ SOLN
INTRAMUSCULAR | Status: DC | PRN
  Administered 2017-11-23: 10 mg via INTRAVENOUS
  Administered 2017-11-23: 5 mg via INTRAVENOUS
  Administered 2017-11-23: 10 mg via INTRAVENOUS

## 2017-11-23 MED ORDER — SODIUM CHLORIDE 0.9 % IV SOLN
3.0000 mg | Freq: Once | INTRAVENOUS | Status: AC
  Administered 2017-11-23: 3 mg via INTRAVENOUS
  Filled 2017-11-23: qty 2

## 2017-11-23 MED ORDER — BUPIVACAINE LIPOSOME 1.3 % IJ SUSP
INTRAMUSCULAR | Status: DC | PRN
  Administered 2017-11-23: 10 mL

## 2017-11-23 MED ORDER — PHENYTOIN SODIUM 50 MG/ML IJ SOLN: INTRAMUSCULAR | Status: DC | PRN

## 2017-11-23 MED ORDER — CEFAZOLIN SODIUM-DEXTROSE 2-4 GM/100ML-% IV SOLN
2.0000 g | INTRAVENOUS | Status: AC
  Administered 2017-11-23: 2 g via INTRAVENOUS

## 2017-11-23 MED ORDER — MIDAZOLAM HCL 2 MG/2ML IJ SOLN
INTRAMUSCULAR | Status: AC
  Filled 2017-11-23: qty 2

## 2017-11-23 MED ORDER — ROCURONIUM BROMIDE 100 MG/10ML IV SOLN
INTRAVENOUS | Status: DC | PRN
  Administered 2017-11-23: 40 mg via INTRAVENOUS

## 2017-11-23 MED ORDER — FENTANYL CITRATE (PF) 100 MCG/2ML IJ SOLN
50.0000 ug | Freq: Once | INTRAMUSCULAR | Status: AC
  Administered 2017-11-23: 50 ug via INTRAVENOUS

## 2017-11-23 MED ORDER — ONDANSETRON HCL 4 MG/2ML IJ SOLN
INTRAMUSCULAR | Status: DC | PRN
  Administered 2017-11-23: 4 mg via INTRAVENOUS

## 2017-11-23 MED ORDER — SUGAMMADEX SODIUM 200 MG/2ML IV SOLN
INTRAVENOUS | Status: DC | PRN
  Administered 2017-11-23: 150 mg via INTRAVENOUS

## 2017-11-23 MED ORDER — VANCOMYCIN HCL 1000 MG IV SOLR
INTRAVENOUS | Status: DC | PRN
  Administered 2017-11-23: 1000 mg via TOPICAL

## 2017-11-23 MED ORDER — CEFAZOLIN SODIUM-DEXTROSE 2-4 GM/100ML-% IV SOLN
INTRAVENOUS | Status: AC
  Filled 2017-11-23: qty 100

## 2017-11-23 MED ORDER — ONDANSETRON HCL 4 MG/2ML IJ SOLN: 4.0000 mg | Freq: Three times a day (TID) | INTRAMUSCULAR | Status: DC | PRN

## 2017-11-23 MED ORDER — FENTANYL CITRATE (PF) 100 MCG/2ML IJ SOLN
INTRAMUSCULAR | Status: DC | PRN
  Administered 2017-11-23: 50 ug via INTRAVENOUS

## 2017-11-23 MED ORDER — SENNOSIDES-DOCUSATE SODIUM 8.6-50 MG PO TABS: 1.0000 | ORAL_TABLET | Freq: Every evening | ORAL | Status: DC | PRN

## 2017-11-23 MED ORDER — LIDOCAINE HCL (CARDIAC) 20 MG/ML IV SOLN
INTRAVENOUS | Status: DC | PRN
  Administered 2017-11-23: 30 mg via INTRATRACHEAL

## 2017-11-23 MED ORDER — CEFAZOLIN SODIUM-DEXTROSE 2-4 GM/100ML-% IV SOLN
2.0000 g | Freq: Three times a day (TID) | INTRAVENOUS | Status: AC
  Administered 2017-11-23 – 2017-11-24 (×3): 2 g via INTRAVENOUS
  Filled 2017-11-23 (×3): qty 100

## 2017-11-23 MED ORDER — FLUTICASONE FUROATE-VILANTEROL 100-25 MCG/INH IN AEPB
1.0000 | INHALATION_SPRAY | Freq: Every morning | RESPIRATORY_TRACT | Status: DC
  Administered 2017-11-24: 1 via RESPIRATORY_TRACT
  Filled 2017-11-23: qty 28

## 2017-11-23 MED ORDER — SUGAMMADEX SODIUM 200 MG/2ML IV SOLN
INTRAVENOUS | Status: AC
  Filled 2017-11-23: qty 2

## 2017-11-23 MED ORDER — FENTANYL CITRATE (PF) 100 MCG/2ML IJ SOLN
INTRAMUSCULAR | Status: AC
  Filled 2017-11-23: qty 2

## 2017-11-23 MED ORDER — ASPIRIN EC 81 MG PO TBEC
81.0000 mg | DELAYED_RELEASE_TABLET | Freq: Every day | ORAL | Status: DC
  Administered 2017-11-24 – 2017-11-25 (×2): 81 mg via ORAL
  Filled 2017-11-23 (×2): qty 1

## 2017-11-23 MED ORDER — CITALOPRAM HYDROBROMIDE 20 MG PO TABS
40.0000 mg | ORAL_TABLET | Freq: Every day | ORAL | Status: DC
  Administered 2017-11-24 – 2017-11-25 (×2): 40 mg via ORAL
  Filled 2017-11-23 (×3): qty 2

## 2017-11-23 MED ORDER — ACETAMINOPHEN 325 MG PO TABS: 650.0000 mg | ORAL_TABLET | ORAL | Status: DC | PRN

## 2017-11-23 MED ORDER — PROPOFOL 10 MG/ML IV BOLUS
INTRAVENOUS | Status: AC
  Filled 2017-11-23: qty 20

## 2017-11-23 MED ORDER — LIDOCAINE 2% (20 MG/ML) 5 ML SYRINGE
INTRAMUSCULAR | Status: AC
Start: 2017-11-23 — End: 2017-11-23
  Filled 2017-11-23: qty 5

## 2017-11-23 MED ORDER — SENNA 8.6 MG PO TABS
2.0000 | ORAL_TABLET | Freq: Every day | ORAL | Status: DC
  Administered 2017-11-23: 17.2 mg via ORAL
  Filled 2017-11-23: qty 2

## 2017-11-23 MED ORDER — PANTOPRAZOLE SODIUM 40 MG PO TBEC
40.0000 mg | DELAYED_RELEASE_TABLET | Freq: Every day | ORAL | Status: DC
  Administered 2017-11-24 – 2017-11-25 (×2): 40 mg via ORAL
  Filled 2017-11-23 (×3): qty 1

## 2017-11-23 MED ORDER — SODIUM CHLORIDE 0.9 % IV SOLN
INTRAVENOUS | Status: DC
  Administered 2017-11-23 – 2017-11-24 (×2): via INTRAVENOUS

## 2017-11-23 MED ORDER — CHLORHEXIDINE GLUCONATE 4 % EX LIQD
60.0000 mL | Freq: Once | CUTANEOUS | Status: DC
  Filled 2017-11-23: qty 60

## 2017-11-23 MED ORDER — ENOXAPARIN SODIUM 40 MG/0.4ML ~~LOC~~ SOLN
40.0000 mg | SUBCUTANEOUS | Status: DC
  Administered 2017-11-23: 40 mg via SUBCUTANEOUS
  Filled 2017-11-23: qty 0.4

## 2017-11-23 MED ORDER — HYDROCODONE-ACETAMINOPHEN 5-325 MG PO TABS
1.0000 | ORAL_TABLET | ORAL | Status: DC | PRN
  Administered 2017-11-24 – 2017-11-25 (×2): 1 via ORAL
  Filled 2017-11-23 (×2): qty 1

## 2017-11-23 MED ORDER — PROPOFOL 10 MG/ML IV BOLUS
INTRAVENOUS | Status: DC | PRN
  Administered 2017-11-23: 100 mg via INTRAVENOUS

## 2017-11-23 MED ORDER — ONDANSETRON HCL 4 MG PO TABS: 4.0000 mg | ORAL_TABLET | Freq: Three times a day (TID) | ORAL | Status: DC | PRN

## 2017-11-23 MED ORDER — LACTATED RINGERS IV SOLN
INTRAVENOUS | Status: DC
  Administered 2017-11-23: 14:00:00 via INTRAVENOUS

## 2017-11-23 MED ORDER — EPHEDRINE 5 MG/ML INJ
INTRAVENOUS | Status: AC
  Filled 2017-11-23: qty 10

## 2017-11-23 MED ORDER — VANCOMYCIN HCL 1000 MG IV SOLR
INTRAVENOUS | Status: AC
  Filled 2017-11-23: qty 1000

## 2017-11-23 MED ORDER — ONDANSETRON HCL 4 MG/2ML IJ SOLN
INTRAMUSCULAR | Status: AC
  Filled 2017-11-23: qty 2

## 2017-11-23 MED ORDER — POLYETHYLENE GLYCOL 3350 17 G PO PACK
17.0000 g | PACK | Freq: Every day | ORAL | Status: DC
  Administered 2017-11-23 – 2017-11-25 (×3): 17 g via ORAL
  Filled 2017-11-23 (×3): qty 1

## 2017-11-23 MED ORDER — FENTANYL CITRATE (PF) 250 MCG/5ML IJ SOLN
INTRAMUSCULAR | Status: AC
  Filled 2017-11-23: qty 5

## 2017-11-23 MED ORDER — MORPHINE SULFATE (PF) 2 MG/ML IV SOLN
1.0000 mg | INTRAVENOUS | Status: DC | PRN
  Administered 2017-11-23 – 2017-11-24 (×2): 1 mg via INTRAVENOUS
  Filled 2017-11-23 (×2): qty 1

## 2017-11-23 MED ORDER — PHENYLEPHRINE HCL 10 MG/ML IJ SOLN
INTRAMUSCULAR | Status: DC | PRN
Start: 2017-11-23 — End: 2017-11-23
  Administered 2017-11-23 (×2): 60 ug via INTRAVENOUS
  Administered 2017-11-23: 40 ug via INTRAVENOUS
  Administered 2017-11-23 (×3): 80 ug via INTRAVENOUS

## 2017-11-23 MED ORDER — FENTANYL CITRATE (PF) 100 MCG/2ML IJ SOLN
25.0000 ug | INTRAMUSCULAR | Status: DC | PRN
Start: 2017-11-23 — End: 2017-11-23

## 2017-11-23 MED ORDER — ONDANSETRON HCL 8 MG PO TABS: 8.0000 mg | ORAL_TABLET | Freq: Three times a day (TID) | ORAL | Status: DC | PRN

## 2017-11-23 MED ORDER — 0.9 % SODIUM CHLORIDE (POUR BTL) OPTIME
TOPICAL | Status: DC | PRN
  Administered 2017-11-23: 1000 mL

## 2017-11-23 SURGICAL SUPPLY — 69 items
BANDAGE ELASTIC 4 VELCRO ST LF (GAUZE/BANDAGES/DRESSINGS) ×3 IMPLANT
BANDAGE ELASTIC 6 VELCRO ST LF (GAUZE/BANDAGES/DRESSINGS) ×3 IMPLANT
BIT DRILL 2.9 SHORT NS (BIT) ×1
BIT DRILL 2.9MM SHORT NS (BIT) ×1 IMPLANT
BIT DRILL 3.8 (BIT) ×2
BIT DRILL 3.8XNS DISP GRN (BIT) ×1 IMPLANT
BIT DRL 3.8XNS DISP GRN (BIT) ×1
BNDG COHESIVE 4X5 TAN STRL (GAUZE/BANDAGES/DRESSINGS) ×3 IMPLANT
BRUSH SCRUB SURG 4.25 DISP (MISCELLANEOUS) ×6 IMPLANT
CHLORAPREP W/TINT 26ML (MISCELLANEOUS) ×3 IMPLANT
COVER SURGICAL LIGHT HANDLE (MISCELLANEOUS) ×6 IMPLANT
DERMABOND ADHESIVE PROPEN (GAUZE/BANDAGES/DRESSINGS) ×2
DERMABOND ADVANCED (GAUZE/BANDAGES/DRESSINGS) ×4
DERMABOND ADVANCED .7 DNX12 (GAUZE/BANDAGES/DRESSINGS) ×2 IMPLANT
DERMABOND ADVANCED .7 DNX6 (GAUZE/BANDAGES/DRESSINGS) ×1 IMPLANT
DRAPE C-ARM 42X72 X-RAY (DRAPES) ×3 IMPLANT
DRAPE INCISE IOBAN 66X45 STRL (DRAPES) ×3 IMPLANT
DRAPE ORTHO SPLIT 77X108 STRL (DRAPES) ×4
DRAPE SURG 17X23 STRL (DRAPES) ×3 IMPLANT
DRAPE SURG ORHT 6 SPLT 77X108 (DRAPES) ×2 IMPLANT
DRAPE U-SHAPE 47X51 STRL (DRAPES) ×6 IMPLANT
DRILL BIT 2.9MM SHORT NS (BIT) ×2
DRSG MEPILEX BORDER 4X4 (GAUZE/BANDAGES/DRESSINGS) ×3 IMPLANT
DRSG MEPILEX BORDER 4X8 (GAUZE/BANDAGES/DRESSINGS) ×3 IMPLANT
DRSG PAD ABDOMINAL 8X10 ST (GAUZE/BANDAGES/DRESSINGS) ×3 IMPLANT
ELECT REM PT RETURN 9FT ADLT (ELECTROSURGICAL) ×3
ELECTRODE REM PT RTRN 9FT ADLT (ELECTROSURGICAL) ×1 IMPLANT
EVACUATOR 1/8 PVC DRAIN (DRAIN) IMPLANT
GLOVE BIO SURGEON STRL SZ7.5 (GLOVE) ×12 IMPLANT
GLOVE BIOGEL PI IND STRL 7.5 (GLOVE) ×1 IMPLANT
GLOVE BIOGEL PI INDICATOR 7.5 (GLOVE) ×2
GOWN STRL REUS W/ TWL LRG LVL3 (GOWN DISPOSABLE) ×2 IMPLANT
GOWN STRL REUS W/TWL LRG LVL3 (GOWN DISPOSABLE) ×4
GUIDEWIRE BALL NOSE 2.0MM (WIRE) ×3 IMPLANT
KIT BASIN OR (CUSTOM PROCEDURE TRAY) ×3 IMPLANT
KIT ROOM TURNOVER OR (KITS) ×3 IMPLANT
MANIFOLD NEPTUNE II (INSTRUMENTS) ×3 IMPLANT
NAIL HUMERUS UNIVERSAL 8X240MM (Trauma) ×1 IMPLANT
NEEDLE HYPO 25X1 1.5 SAFETY (NEEDLE) ×3 IMPLANT
NS IRRIG 1000ML POUR BTL (IV SOLUTION) ×3 IMPLANT
PACK ORTHO EXTREMITY (CUSTOM PROCEDURE TRAY) ×3 IMPLANT
PAD ARMBOARD 7.5X6 YLW CONV (MISCELLANEOUS) ×6 IMPLANT
PIN GUIDE ACE (PIN) ×3 IMPLANT
SCREW 3.5MM CORT NONSTRL 28MM (Screw) ×3 IMPLANT
SCREW ACECAP 28MM (Screw) ×3 IMPLANT
SCREW ACECAP 48MM (Screw) ×3 IMPLANT
SCREWDRIVER HEX TIP 3.5MM (MISCELLANEOUS) ×3 IMPLANT
SLING ARM FOAM STRAP MED (SOFTGOODS) ×3 IMPLANT
SPONGE LAP 18X18 X RAY DECT (DISPOSABLE) ×3 IMPLANT
STAPLER VISISTAT 35W (STAPLE) ×3 IMPLANT
SUCTION FRAZIER HANDLE 10FR (MISCELLANEOUS) ×2
SUCTION TUBE FRAZIER 10FR DISP (MISCELLANEOUS) ×1 IMPLANT
SUT ETHILON 3 0 PS 1 (SUTURE) ×6 IMPLANT
SUT MAXBRAID (SUTURE) ×3 IMPLANT
SUT MNCRL AB 3-0 PS2 18 (SUTURE) ×3 IMPLANT
SUT PROLENE 0 CT (SUTURE) IMPLANT
SUT VIC AB 0 CT1 27 (SUTURE) ×4
SUT VIC AB 0 CT1 27XBRD ANBCTR (SUTURE) ×2 IMPLANT
SUT VIC AB 2-0 CT1 27 (SUTURE) ×4
SUT VIC AB 2-0 CT1 TAPERPNT 27 (SUTURE) ×2 IMPLANT
SYR CONTROL 10ML LL (SYRINGE) ×3 IMPLANT
TOWEL OR 17X24 6PK STRL BLUE (TOWEL DISPOSABLE) ×3 IMPLANT
TOWEL OR 17X26 10 PK STRL BLUE (TOWEL DISPOSABLE) ×9 IMPLANT
TRAY FOLEY W/METER SILVER 16FR (SET/KITS/TRAYS/PACK) IMPLANT
TUBE CONNECTING 12'X1/4 (SUCTIONS) ×1
TUBE CONNECTING 12X1/4 (SUCTIONS) ×2 IMPLANT
UNIVERSAL HUM NAIL 8X240MM (Trauma) ×3 IMPLANT
WATER STERILE IRR 1000ML POUR (IV SOLUTION) ×3 IMPLANT
YANKAUER SUCT BULB TIP NO VENT (SUCTIONS) IMPLANT

## 2017-11-23 NOTE — Progress Notes (Signed)
Report given to CRNA .  

## 2017-11-23 NOTE — Anesthesia Procedure Notes (Addendum)
Anesthesia Regional Block: Supraclavicular block   Pre-Anesthetic Checklist: ,, timeout performed, Correct Patient, Correct Site, Correct Laterality, Correct Procedure, Correct Position, site marked, Risks and benefits discussed,  Surgical consent,  Pre-op evaluation,  At surgeon's request and post-op pain management  Laterality: Left  Prep: chloraprep       Needles:  Injection technique: Single-shot  Needle Type: Echogenic Stimulator Needle     Needle Length: 5cm  Needle Gauge: 22     Additional Needles:   Procedures:, nerve stimulator,,, ultrasound used (permanent image in chart),,,,  Narrative:  Start time: 11/23/2017 12:50 PM End time: 11/23/2017 12:55 PM Injection made incrementally with aspirations every 5 mL.  Performed by: Personally  Anesthesiologist: Janeece Riggers, MD  Additional Notes: Functioning IV was confirmed and monitors were applied.  A 48mm 22ga Arrow echogenic stimulator needle was used. Sterile prep and drape,hand hygiene and sterile gloves were used. Ultrasound guidance: relevant anatomy identified, needle position confirmed, local anesthetic spread visualized around nerve(s)., vascular puncture avoided.  Image printed for medical record. Negative aspiration and negative test dose prior to incremental administration of local anesthetic. The patient tolerated the procedure well.

## 2017-11-23 NOTE — Consult Note (Signed)
Reason for Consult:Right pathologic humerus fx Referring Physician: Fermin Schwab  Brooke Washington is an 77 y.o. female.  HPI: Eirene had been having right upper arm pain for about a month that was not precipitated by a specific event. She was pulling on her pants on 2/4 and heard a pop and had severe pain. She was diagnosed with a humerus fx and referred to Dr. Lorin Mercy. He and his PA felt a pathologic process was at fault and ordered further imaging. She does have a tumor with an enlarged lymph node adjacent to the fracture. Her oncologist admitted her to try and facilitate fixation and on-call orthopedics was consulted last night. She c/o localized pain but no N/T. She is RHD. She lives at home with her husband and doesn't work.  Past Medical History:  Diagnosis Date  . Cancer ([place])  APRIL OF 2000   LOW-GRADE NON-HODGKIN'S LYMPHOMA  . COPD (chronic obstructive pulmonary disease) ([place])   . Emphysema of lung ([place])   . Hyperlipidemia     Past Surgical History:  Procedure Laterality Date  . 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 RIGHT OOPHORECTOMY  1976    Family History  Problem Relation Age of Onset  . Cancer Mother        colon ca    Social History:  reports that she quit smoking about 2 years ago. Her smoking use included cigarettes. She has a 12.75 pack-year smoking history. she has never used smokeless tobacco. She reports that she drinks alcohol. She reports that she does not use drugs.  Allergies:  Allergies  Allergen Reactions  . Contrast Media [Iodinated Diagnostic Agents] Shortness Of Breath, Rash and Other (See Comments)    Throat swells  . Iohexol Shortness Of Breath  . Povidone-Iodine Hives, Shortness Of Breath and Rash    REACTION: throat swells  . Penicillins Hives, Itching and Rash    Has patient had a PCN reaction causing immediate rash, facial/tongue/throat swelling, SOB or lightheadedness  with hypotension: Unknown Has patient had a PCN reaction causing severe rash involving mucus membranes or skin necrosis: Yes Has patient had a PCN reaction that required hospitalization: Unknown Has patient had a PCN reaction occurring within the last 10 years: Unknown If all of the above answers are "NO", then may proceed with Cephalosporin use.   . Adhesive [Tape] Hives    Medications: I have reviewed the patient's current medications.  Results for orders placed or performed during the hospital encounter of 11/23/17 (from the past 48 hour(s))  Protime-INR     Status: None   Collection Time: 11/23/17  9:02 AM  Result Value Ref Range   Prothrombin Time 12.4 11.4 - 15.2 seconds   INR 0.93     Comment: Performed at [facility], [place] [address], [place], [place] [address]    Nm Pet Image Restag (ps) Skull Base To Thigh  Result Date: 11/22/2017 CLINICAL DATA:  Subsequent treatment strategy for non-Hodgkin lymphoma. EXAM: NUCLEAR MEDICINE PET SKULL BASE TO THIGH TECHNIQUE: 6.7 mCi F-18 FDG was injected intravenously. Full-ring PET imaging was performed from the skull base to thigh after the radiotracer. CT data was obtained and used for attenuation correction and anatomic localization. FASTING BLOOD GLUCOSE:  Value: 95 mg/dl COMPARISON:  CT on 09/16/2014 and PET-CT on 09/09/2009 FINDINGS: NECK:  No hypermetabolic lymph nodes or masses. CHEST: New Sub-cm hypermetabolic lymph nodes are seen in the right internal  mammary chain and along the posterior wall the right mainstem bronchus, with SUV max of 11.1. New hypermetabolic activity is also seen in the right hilum, with SUV max of 5.7. No suspicious pulmonary nodules are seen on CT images. ABDOMEN/PELVIS: New large hypermetabolic mass is seen in the posterior right hepatic lobe with SUV max of 33.5. No evidence of splenic involvement. A focal hypermetabolic lesion is seen lateral midpole of the left kidney which has SUV max of 18.5.  This is suspicious for renal involvement by lymphoma, however primary renal cell carcinoma cannot be excluded. Hypermetabolic lymphadenopathy is seen in the right cardiophrenic angle, porta hepatis, and abdominal retroperitoneum in the retrocaval, aortocaval and left paraaortic spaces. Index lymphadenopathy in the porta hepatis measures 3.7 cm in short axis on image 103/4, with SUV max of 30.3. No pelvic lymphadenopathy identified. Small hiatal hernia again noted. SKELETON: Hypermetabolic mass is seen at site of pathologic fracture in the proximal humeral diaphysis, which has SUV max of 17.6. A sub-cm hypermetabolic lymph node is seen along the lateral aspect of this mass in the adjacent subcutaneous tissues with SUV max of 5.7. Small foci of hypermetabolic activity are also seen in the right posterior chest wall in the intercostal space between the right posterior 10th and 11th ribs. IMPRESSION: New hypermetabolic lymphadenopathy within the chest and abdomen, consistent with recurrent lymphoma. New large hypermetabolic mass in the right hepatic lobe, consistent with lymphoma this involvement. New small hypermetabolic mass in the left kidney, which may be due to lymphomatous involvement although primary renal cell carcinoma cannot be excluded. Hypermetabolic mass in the right humerus with pathologic fracture, consistent with lymphomatous involvement. Small hypermetabolic foci in right posterior chest wall, also suspicious for recurrent lymphoma. Electronically Signed   By: Earle Gell M.D.   On: 11/22/2017 10:47    Review of Systems  Constitutional: Negative for weight loss.  HENT: Negative for ear discharge, ear pain, hearing loss and tinnitus.   Eyes: Negative for blurred vision, double vision, photophobia and pain.  Respiratory: Negative for cough, sputum production and shortness of breath.   Cardiovascular: Negative for chest pain.  Gastrointestinal: Negative for abdominal pain, nausea and vomiting.   Genitourinary: Negative for dysuria, flank pain, frequency and urgency.  Musculoskeletal: Positive for joint pain (Right arm). Negative for back pain, falls, myalgias and neck pain.  Neurological: Negative for dizziness, tingling, sensory change, focal weakness, loss of consciousness and headaches.  Endo/Heme/Allergies: Does not bruise/bleed easily.  Psychiatric/Behavioral: Negative for depression, memory loss and substance abuse. The patient is not nervous/anxious.    Blood pressure 118/80, pulse 92, temperature 97.8 F (36.6 C), temperature source Oral, resp. rate 18, height 5\' 4"  (1.626 m), weight 59.5 kg (131 lb 3.2 oz), SpO2 95 %. Physical Exam  Constitutional: She appears well-developed and well-nourished. No distress.  HENT:  Head: Normocephalic and atraumatic.  Eyes: Conjunctivae are normal. Right eye exhibits no discharge. Left eye exhibits no discharge. No scleral icterus.  Neck: Normal range of motion.  Cardiovascular: Normal rate and regular rhythm.  Respiratory: Effort normal. No respiratory distress.  Musculoskeletal:  Right shoulder, elbow, wrist, digits- no skin wounds, TTP upper arm, in sling  Sens  Ax/R/M/U intact  Mot   Ax/ R/ PIN/ M/ AIN/ U intact  Rad 2+  Neurological: She is alert.  Skin: Skin is warm and dry. She is not diaphoretic.  Psychiatric: She has a normal mood and affect. Her behavior is normal.    Assessment/Plan: Right pathologic humerus fx --  For nail and bone bx today by Dr. Doreatha Martin. 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 They asked how soon they could start aggressive chemotherapy from an orthopedics standpoint. Will defer to MD.    Lisette Abu, PA-C Orthopedic Surgery [phone] 11/23/2017, 11:03 AM

## 2017-11-23 NOTE — H&P (Signed)
[facility] ADMISSION NOTE  Patient Care Team: [name], NP as PCP - General (Family Medicine)  CHIEF COMPLAINTS/PURPOSE OF ADMISSION Recurrent lymphoma, pathological fracture of the right humerus, hypercalcemia of malignancy, high uric acid level and high tumor burden, for aggressive management of all the above  HISTORY OF PRESENTING ILLNESS:  Brooke Washington 77 y.o. 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 She was treated with chlorambucil in 2000.   --2007 - 09/2008.  Treated with single agent Rituxan.   --12/2008.  She had progressive adenopathy in chest and abdomen and repeat biopsy showed low grade follicular and diffuse B cell NHL.   --01/2009 - 07/2009. She required left ureteral stent and was treated with Treanda/Rituxan x 5 cycles from 02-2009 thru 06-2009.       11/20/2008 Imaging    Mildly displaced and angulated pathologic fracture of the proximal humeral shaft. 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       09/16/2014 Imaging    No lymphadenopathy in the chest, abdomen, or pelvis. The small right axillary lymph nodes seen on the previous exam are unchanged in the 18 month interval. Small to moderate hiatal hernia.      11/22/2017 PET scan    New hypermetabolic lymphadenopathy within the chest and abdomen, consistent with recurrent lymphoma. New large hypermetabolic mass in the right hepatic lobe, consistent with lymphoma this involvement. New small hypermetabolic mass in the left kidney, which may be due to lymphomatous  involvement although primary renal cell carcinoma cannot be excluded. Hypermetabolic mass in the right humerus with pathologic fracture, consistent with lymphomatous involvement. Small hypermetabolic foci in right posterior chest wall, also suspicious for recurrent lymphoma.      The patient has significant pain in the right humerus.  This is currently controlled with current prescription pain medicine.  She have hypercalcemia and high uric acid level with high LDH.  She denies nausea or vomiting.  She is constipated for 2 days from her pain medicine.  She is anxious to start treatment as soon as possible  MEDICAL HISTORY:  Past Medical History:  Diagnosis Date  . Cancer ([place])  APRIL OF 2000   LOW-GRADE NON-HODGKIN'S LYMPHOMA  . COPD (chronic obstructive pulmonary disease) ([place])   . Emphysema of lung ([place])   . Hyperlipidemia     SURGICAL HISTORY: Past Surgical History:  Procedure Laterality Date  . 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 RIGHT OOPHORECTOMY  1976    SOCIAL HISTORY: Social History   Socioeconomic History  . Marital status: Married    Spouse name: Not on file  . Number of children: Not on file  . Years of education: Not on file  . Highest education level: Not on file  Social Needs  . Financial resource strain: Not on file  . Food insecurity - worry: Not on file  . Food insecurity - inability: Not on file  . Transportation needs - medical: Not on file  . Transportation needs - non-medical: Not on file  Occupational History  .  Not on file  Tobacco Use  . Smoking status: Former Smoker    Packs/day: 0.25    Years: 51.00    Pack years: 12.75    Types: Cigarettes    Last attempt to quit: 03/12/2015    Years since quitting: 2.7  . Smokeless tobacco: Never Used  Substance and Sexual Activity  . Alcohol use: Yes    Alcohol/week: 0.0 oz    Comment: 1 glass of wine monthly  . Drug use: No   . Sexual activity: Not on file  Other Topics Concern  . 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       Pets: Dog      Diet: Healthy diet, fruits, veggies and fish.    Exercise: None        FAMILY HISTORY: Family History  Problem Relation Age of Onset  . Cancer Mother        colon ca    ALLERGIES:  is allergic to contrast media [iodinated diagnostic agents]; iohexol; povidone-iodine; penicillins; and adhesive [tape].  MEDICATIONS:  Current Facility-Administered Medications  Medication Dose Route Frequency Provider Last Rate Last Dose  . 0.9 %  sodium chloride infusion   Intravenous Continuous Alvy Bimler, Neizan Debruhl, MD      . acetaminophen (TYLENOL) tablet 650 mg  650 mg Oral Q4H PRN Alvy Bimler, Naomi Fitton, MD      . aspirin tablet 81 mg  81 mg Oral Daily Maude Hettich, MD      . Cholecalciferol TABS 2,000 Units  1 tablet Oral Daily Sriyan Cutting, MD      . citalopram (CELEXA) tablet 40 mg  40 mg Oral Daily Reni Hausner, MD      . enoxaparin (LOVENOX) injection 40 mg  40 mg Subcutaneous Q24H Charnae Lill, MD      . fluticasone furoate-vilanterol (BREO ELLIPTA) 100-25 MCG/INH 1 puff  1 puff Inhalation q morning - 10a Loghan Subia, MD      . HYDROcodone-acetaminophen (NORCO/VICODIN) 5-325 MG per tablet 1 tablet  1 tablet Oral Q4H PRN Kingston Guiles, MD      . morphine 2 MG/ML injection 1 mg  1 mg Intravenous Q2H PRN Teshawn Moan, MD      . ondansetron (ZOFRAN) tablet 4-8 mg  4-8 mg Oral Q8H PRN Alvy Bimler, Shavonta Gossen, MD       Or  . ondansetron (ZOFRAN-ODT) disintegrating tablet 4-8 mg  4-8 mg Oral Q8H PRN Alvy Bimler, Kiley Torrence, MD       Or  . ondansetron (ZOFRAN) injection 4 mg  4 mg Intravenous Q8H PRN Raniyah Curenton, MD       Or  . ondansetron (ZOFRAN) 8 mg in sodium chloride 0.9 % 50 mL IVPB  8 mg Intravenous Q8H PRN  Kristin Lamagna, MD      . pantoprazole (PROTONIX) EC tablet 40 mg  40 mg Oral Daily Modest Draeger, MD      . polyethylene glycol (MIRALAX / GLYCOLAX) packet 17 g  17 g Oral Daily Ramone Gander, MD      . rasburicase (ELITEK) 3 mg in sodium chloride 0.9 % 48 mL IVPB  3 mg Intravenous Once [name], MD      . senna ([place])  tablet 17.2 mg  2 tablet Oral QHS Earnestine Tuohey, MD      . senna-docusate (Senokot-S) tablet 1 tablet  1 tablet Oral QHS PRN Alvy Bimler, Hazel Leveille, MD        REVIEW OF SYSTEMS:   Constitutional: Denies fevers, chills or abnormal night sweats Eyes: Denies blurriness of vision, double vision or watery eyes Ears, nose, mouth, throat, and face: Denies mucositis or sore throat Respiratory: Denies cough, dyspnea or wheezes Cardiovascular: Denies palpitation, chest discomfort or lower extremity swelling Skin: Denies abnormal skin rashes Lymphatics: Denies new lymphadenopathy or easy bruising Neurological:Denies numbness, tingling or new weaknesses Behavioral/Psych: Mood is stable, no new changes  All other systems were reviewed with the patient and are negative.  PHYSICAL EXAMINATION: ECOG PERFORMANCE STATUS: 1 - Symptomatic but completely ambulatory  Vitals:   11/23/17 0740  BP: 118/80  Pulse: 92  Resp: 18  Temp: 97.8 F (36.6 C)  SpO2: 95%   Filed Weights   11/23/17 0740  Weight: 131 lb 3.2 oz (59.5 kg)    GENERAL:alert, no distress and comfortable.  She is thin SKIN: skin color, texture, turgor are normal, no rashes or significant lesions EYES: normal, conjunctiva are pink and non-injected, sclera clear OROPHARYNX:no exudate, no erythema and lips, buccal mucosa, and tongue normal  NECK: supple, thyroid normal size, non-tender, without nodularity LYMPH:  no palpable lymphadenopathy in the cervical, axillary or inguinal LUNGS: clear to auscultation and percussion with normal breathing effort HEART: regular rate & rhythm and no murmurs and no lower extremity  edema ABDOMEN:abdomen soft, non-tender and normal bowel sounds Musculoskeletal:no cyanosis of digits and no clubbing.  Limited exam due to the sling over her right shoulder PSYCH: alert & oriented x 3 with fluent speech NEURO: no focal motor/sensory deficits  LABORATORY DATA:  I have reviewed the data as listed Lab Results  Component Value Date   WBC 7.8 11/21/2017   HGB 13.1 11/21/2017   HCT 38.7 11/21/2017   MCV 95.4 11/21/2017   PLT 357 11/21/2017   Recent Labs    12/28/16 1002 05/24/17 0842 11/21/17 1321  NA 142 141 143  K 4.6 4.6 4.3  CL  --  101 101  CO2 29 31 32*  GLUCOSE 85 86 82  BUN 20.3 15 21   CREATININE 0.9 0.84 1.02  CALCIUM 10.1 10.2 10.9*  GFRNONAA  --   --  52*  GFRAA  --   --  60*  PROT 6.8 5.9* 6.6  ALBUMIN 4.2 4.6 3.7  AST 20 22 59*  ALT 12 12 42  ALKPHOS 111 97 161*  BILITOT 0.55 0.7 0.5  BILIDIR  --  0.1  --     RADIOGRAPHIC STUDIES: I have personally reviewed the radiological images as listed and agreed with the findings in the report. Dg Shoulder Right  Result Date: 11/15/2017 CLINICAL DATA:  Left arm pain for the past 3 weeks.  No injury. EXAM: RIGHT SHOULDER - 2+ VIEW; RIGHT HUMERUS - 2+ VIEW COMPARISON:  None. FINDINGS: There is a acute, oblique fracture through the proximal humeral diaphysis with minimal lateral and posterior displacement. No additional fracture seen. Mild acromioclavicular osteoarthritis. Diffuse osteopenia. IMPRESSION: 1. Acute, minimally displaced fracture of the proximal humeral diaphysis. Electronically Signed   By: Titus Dubin M.D.   On: 11/15/2017 10:16   Mr Humerus Right W Wo Contrast  Result Date: 11/20/2017 CLINICAL DATA:  Nontraumatic fracture of the right humerus. History of lymphoma. Creatinine was obtained on site at [facility] at [address] Results: Creatinine 1.2 mg/dL. EXAM: MRI OF THE RIGHT HUMERUS WITHOUT AND WITH CONTRAST TECHNIQUE: Multiplanar, multisequence MR imaging of the right  upper extremity was performed before and after the administration of intravenous contrast. CONTRAST:  86mL MULTIHANCE GADOBENATE DIMEGLUMINE 529 MG/ML IV SOLN COMPARISON:  Radiographs 11/15/2017 FINDINGS: As demonstrated on the radiographs there is a displaced fracture involving the proximal humeral diaphysis with slight angulation. The marrow is completely replaced with low T1 and heterogeneous high T2 signal intensity most likely osseous lymphoma. This also demonstrates diffuse enhancement. The epiphyseal regions are spared. Edema/inflammation/hemorrhage noted surrounding the fracture and in the musculature and in the subcutaneous tissues. The visualized scapula and clavicle are unremarkable. No obvious rib lesions. IMPRESSION: Mildly displaced and angulated pathologic fracture of the proximal humeral shaft. 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 Electronically Signed   By: Marijo Sanes M.D.   On: 11/20/2017 08:17   Nm Pet Image Restag (ps) Skull Base To Thigh  Result Date: 11/22/2017 CLINICAL DATA:  Subsequent treatment strategy for non-Hodgkin lymphoma. EXAM: NUCLEAR MEDICINE PET SKULL BASE TO THIGH TECHNIQUE: 6.7 mCi F-18 FDG was injected intravenously. Full-ring PET imaging was performed from the skull base to thigh after the radiotracer. CT data was obtained and used for attenuation correction and anatomic localization. FASTING BLOOD GLUCOSE:  Value: 95 mg/dl COMPARISON:  CT on 09/16/2014 and PET-CT on 09/09/2009 FINDINGS: NECK:  No hypermetabolic lymph nodes or masses. CHEST: New Sub-cm hypermetabolic lymph nodes are seen in the right internal mammary chain and along the posterior wall the right mainstem bronchus, with SUV max of 11.1. New hypermetabolic activity is also seen in the right hilum, with SUV max of 5.7. No suspicious pulmonary nodules are seen on CT images. ABDOMEN/PELVIS: New large hypermetabolic mass is seen in the posterior right hepatic lobe with SUV  max of 33.5. No evidence of splenic involvement. A focal hypermetabolic lesion is seen lateral midpole of the left kidney which has SUV max of 18.5. This is suspicious for renal involvement by lymphoma, however primary renal cell carcinoma cannot be excluded. Hypermetabolic lymphadenopathy is seen in the right cardiophrenic angle, porta hepatis, and abdominal retroperitoneum in the retrocaval, aortocaval and left paraaortic spaces. Index lymphadenopathy in the porta hepatis measures 3.7 cm in short axis on image 103/4, with SUV max of 30.3. No pelvic lymphadenopathy identified. Small hiatal hernia again noted. SKELETON: Hypermetabolic mass is seen at site of pathologic fracture in the proximal humeral diaphysis, which has SUV max of 17.6. A sub-cm hypermetabolic lymph node is seen along the lateral aspect of this mass in the adjacent subcutaneous tissues with SUV max of 5.7. Small foci of hypermetabolic activity are also seen in the right posterior chest wall in the intercostal space between the right posterior 10th and 11th ribs. IMPRESSION: New hypermetabolic lymphadenopathy within the chest and abdomen, consistent with recurrent lymphoma. New large hypermetabolic mass in the right hepatic lobe, consistent with lymphoma this involvement. New small hypermetabolic mass in the left kidney, which may be due to lymphomatous involvement although primary renal cell carcinoma cannot be excluded. Hypermetabolic mass in the right humerus with pathologic fracture, consistent with lymphomatous involvement. Small hypermetabolic foci in right posterior chest wall, also suspicious for recurrent lymphoma. Electronically Signed   By: Earle Gell M.D.   On: 11/22/2017 10:47   Dg Humerus Right  Result Date: 11/15/2017 CLINICAL DATA:  Left arm pain for the past 3 weeks.  No injury. EXAM: RIGHT SHOULDER -  2+ VIEW; RIGHT HUMERUS - 2+ VIEW COMPARISON:  None. FINDINGS: There is a acute, oblique fracture through the proximal humeral  diaphysis with minimal lateral and posterior displacement. No additional fracture seen. Mild acromioclavicular osteoarthritis. Diffuse osteopenia. IMPRESSION: 1. Acute, minimally displaced fracture of the proximal humeral diaphysis. Electronically Signed   By: Titus Dubin M.D.   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 I do not believe she can wait for outpatient workup She is being admitted to the hospital urgent orthopedic surgery consultation, echocardiogram in anticipation for Adriamycin-containing lymphoma regimen, port placement, possible lymph node biopsy, hydration, treatment of hypercalcemia and high uric acid level If we can get the biopsy results available by the end of the week, she can be started with treatment as soon as possible I spent a lot of time educating the patient and her husband the rationale behind urgent evaluation and hospitalization and they agreed to proceed  Pathological fracture due to neoplastic disease, initial encounter PET/CT scan shows significant disease burden I am concerned about possible fat embolism with untreated fracture I have placed urgent orthopedic consultation.  I have discussed briefly with orthopedic surgery who felt that surgery may be indicated.  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  She might benefit from inpatient chemotherapy if surgery is not indicated  Discharge planning She will be discharged once her workup and treatment is completed.  The risk of not admitting her is the risk of death  All questions were answered. The patient knows to call the clinic with any problems, questions or concerns.   [name], MD 11/23/2017 8:31 AM

## 2017-11-23 NOTE — Anesthesia Postprocedure Evaluation (Signed)
Anesthesia Post Note  Patient: Brooke Washington  Procedure(s) Performed: OPEN REDUCTION INTERNAL FIXATION (ORIF) HUMERAL SHAFT FRACTURE (Right Arm Upper)     Patient location during evaluation: PACU Anesthesia Type: General Level of consciousness: awake and alert Pain management: pain level controlled Vital Signs Assessment: post-procedure vital signs reviewed and stable Respiratory status: spontaneous breathing, nonlabored ventilation and respiratory function stable Cardiovascular status: blood pressure returned to baseline and stable Postop Assessment: no apparent nausea or vomiting Anesthetic complications: no    Last Vitals:  Vitals:   11/23/17 1550 11/23/17 1656  BP: 106/76 118/68  Pulse: 81 100  Resp: 20 20  Temp: 36.5 C 36.6 C  SpO2: 100% 98%    Last Pain:  Vitals:   11/23/17 1656  TempSrc: Oral  PainSc:                  Lynda Rainwater

## 2017-11-23 NOTE — Transfer of Care (Signed)
Immediate Anesthesia Transfer of Care Note  Patient: Brooke Washington  Procedure(s) Performed: OPEN REDUCTION INTERNAL FIXATION (ORIF) HUMERAL SHAFT FRACTURE (Right Arm Upper)  Patient Location: PACU  Anesthesia Type:GA combined with regional for post-op pain  Level of Consciousness: awake, alert , oriented and patient cooperative  Airway & Oxygen Therapy: Patient Spontanous Breathing and Patient connected to nasal cannula oxygen  Post-op Assessment: Report given to RN and Post -op Vital signs reviewed and stable  Post vital signs: Reviewed and stable  Last Vitals:  Vitals:   11/23/17 1255 11/23/17 1505  BP: (!) 125/52 (!) 95/34  Pulse: 70 (!) 109  Resp: 14 (!) 22  Temp:  36.4 C  SpO2: 96% 97%    Last Pain:  Vitals:   11/23/17 1010  TempSrc:   PainSc: 5          Complications: No apparent anesthesia complications

## 2017-11-23 NOTE — Progress Notes (Signed)
I was contacted by Dr. Alvy Bimler regarding transfer of this highly complex cancer patient from [facility] to [facility] for orthopedic surgery.  In short, the patient is a 76yo female with remote h/o lymphoma with new recurrence concerning for malignant transformation to high-grade lymphoma.  She has a pathological humerus fracture thought to be due to neoplastic disease.  She was admitted this AM to [facility] after having been to the ER last week.  "Her entire right arm is replaced with tumor."  She has spoken with 3 different orthopedic surgeons to go to the OR this afternoon here at [facility].  Dr. Calton Dach plan is to work towards chemotherapy once fracture is stabilized.  After surgery, she can moved back to [facility] once she is ready for initiation of chemotherapy.  She may even go home after surgery prior to returning for chemotherapy. Orthopedist is Dr. Doreatha Martin, [facility].  I spoke with PA Gorden Harms and he is absolutely ok with the plan for the patient to return to [facility] post-operatively.  The equipment at [facility] is more appropriate for the surgery to occur here, but they have no concerns about returning the patient to [facility] post-operatively. I then spoke with Dr. Herbert Moors, who is the [facility] admitter today.  He will consult on the patient and transfer the patient to our service with the plan that the patient will return to the [facility] service at [facility] post-operatively, which allows [place] to easily access the oncology team if further cancer-associated complications arise. [facility] and [facility] are aware and in agreement with the plan.  Carlyon Shadow, M.D.

## 2017-11-23 NOTE — Progress Notes (Signed)
I have received telephone call from orthopedic surgery.  Tentatively, she is scheduled for surgery this afternoon.  I plan to keep her n.p.o. I will arrange transfer of care to [facility] and will consult hospitalist to follow.

## 2017-11-23 NOTE — Op Note (Signed)
OrthopaedicSurgeryOperativeNote ([id number]) Date of Surgery: 11/23/2017  Admit Date: 11/23/2017   Diagnoses: Pre-Op Diagnoses: Right pathologic humerus fracture  Post-Op Diagnosis: Same  Procedures: 1. CPT 24516-Intramedullary nailing of right humerus fracture 2. CPT 20245-Bone biopsy, deep  Surgeons: Primary: Shona Needles, MD   Location:[facility] OR ROOM 07   Anesthesia: General+Block   Antibiotics:Ancef 2g preop   Tourniquettime:* No tourniquets in log * .  HYIFOYDXAJOINOMVEH:20 mL   Complications: None  Specimens: ID Type Source Tests Collected by Time Destination  1 : Right Humeral Shaft Reamings. Tissue Bone SURGICAL PATHOLOGY Doreatha Martin Thomasene Lot, MD 11/23/2017 1411     Implants: Implant Name Type Inv. Item Serial No. Manufacturer Lot No. LRB No. Used Action  UNIVERSAL HUM NAIL [id number] - [id number] Trauma UNIVERSAL HUM NAIL 8X240MM  ZIMMER [employer]ON(ORTH,TRAU,BIO,SG) [id number] Right 1 Implanted  SCREW ACECAP 28MM - [id number] Screw SCREW ACECAP 28MM  ZIMMER RECON(ORTH,TRAU,BIO,SG)  Right 1 Implanted  SCREW ACECAP 48MM - [id number] Screw SCREW ACECAP 48MM  ZIMMER RECON(ORTH,TRAU,BIO,SG)  Right 1 Implanted  SCREW 3.5MM CORT NONSTRL 28MM - [id number] Screw SCREW 3.5MM CORT NONSTRL 28MM  ZIMMER RECON(ORTH,TRAU,BIO,SG)  Right 1 Implanted    IndicationsforSurgery: This is a 77 year old female who has a history of lymphoma.  It was cured however recently she has developed right arm pain and it is worsened after a pathologic fracture.  An MRI was obtained which showed significant tumor burden in it but likely lymphoma lesion.  A PET scan was obtained which showed multiple areas of increased uptake consistent with lymphoma.  She was then  admitted for workup and fixation of her humerus fracture.  I discussed the risks and benefits of proceeding with surgical fixation including ORIF versus intramedullary nailing.  After discussion we plan to proceed with intramedullary nailing to  provide fixation and stabilization while she underwent chemotherapy and radiation therapy. Risks discussed included bleeding requiring blood transfusion, bleeding causing a hematoma, infection, malunion, nonunion, damage to surrounding nerves and blood vessels, pain, hardware prominence or irritation, hardware failure, stiffness, post-traumatic arthritis, DVT/PE, compartment syndrome, and even death. Risks and benefits were extensively discussed as noted above and the patient and their family agreed to proceed with surgery and consent was obtained.  Operative Findings: 1.  Intramedullary nailing of right humerus fracture with Zimmer [employer]omet 8 x 240 mm nail. 2.  Biopsy obtained through entry reamer hole along with 3 means that were sent for pathology  Procedure: The patient was identified in the preoperative holding area. Consent was confirmed with the patient and their family and all questions were answered. The operative extremity was marked after confirmation with the patient. The patient was then brought back to the operating room by our anesthesia colleagues.  The patient was  transferred over to a radiolucent flat top table  A blanket was placed underneath the shoulder to help extend the shoulder.  Fluoroscopy was used to confirm adequate imaging. The operative extremity was then prepped and draped in usual sterile fashion. A preoperative timeout was performed to verify the patient, the procedure, and the extremity. Preoperative antibiotics were dosed.  I then used a K wire to identify the anterior edge of the acromion.  I then made a oblique incision off the anterior edge of the acromion down splitting the deltoid fibers in line with my incision.  I then identified the supraspinatus tendon and split this in line with my incision as well.  I took care not to carry my incision through the tendon to the insertion on the  greater tuberosity.  I then carefully placed a threaded guidepin in the bare spot of  the proximal humerus using fluoroscopy as a guide.  Once I was placed in the proximal humerus deep enough I then used a entry reamer by hand to enter the canal.  Once I was in the canal I then used a pituitary Roger to access and grab multiple specimens from the pathologic fracture and of the tumor itself.  A guidewire was then placed down the center of the canal causing the fracture seating into the distal humeral shaft.  This guidewire was then measured and a 240 mm nail was chosen.  I then sequentially reamed up from 7 mm to 9.5 mm.  I sent the reamings first specimen with the tumor I had already obtained.  I then passed the nail down into the center of the canal.  The fracture aligned appropriately.  I then used the insertion handle to place a 2 proximal interlocks.  The transverse interlocking screw I carefully spread down and protected the soft tissues while I drilled and placed the screw to make sure I did not damage or injure the axillary nerve.  Perfect circle technique was used distally to place one anterior to posterior screw.  The incision was made laterally and the biceps musculature was split in line with the incision and carefully protected while I drilled and placed the screw.  Final fluoroscopic imaging was obtained.  I then irrigated the incisions.  I placed 1 g of vancomycin powder in the incisions.  I closed the incision of the supraspinatus tendon with a #2 FiberWire.  The deltoid split was closed with a #1 Vicryl and the incisions were closed with 2-0 Vicryl, 3-0 Monocryl and Dermabond.  Sterile dressings were placed.  The patient was placed in a sling.  She was awoken and taken to the PACU in stable condition.  Post Op Plan/Instructions: The patient will be nonweightbearing to right upper extremity.  She will be transferred back to [facility].  She will undergo further workup under the direction of the hematology oncology team.  DVT prophylaxis will be at the discretion of the  primary team however I do not feel that it is warranted due to her surgical fracture.  I would recommend holding off for 7-14days before starting chemotherapy so that incisions have an appropriate time to heal.  I am okay with starting radiation therapy as soon as needed.  I was present and performed the entire surgery.  Katha Hamming, MD [facility]

## 2017-11-23 NOTE — Anesthesia Procedure Notes (Signed)
Procedure Name: Intubation Date/Time: 11/23/2017 1:20 PM Performed by: Mariea Clonts, CRNA Pre-anesthesia Checklist: Patient identified, Emergency Drugs available, Suction available, Patient being monitored and Timeout performed Patient Re-evaluated:Patient Re-evaluated prior to induction Oxygen Delivery Method: Circle system utilized Preoxygenation: Pre-oxygenation with 100% oxygen Induction Type: IV induction Ventilation: Mask ventilation without difficulty Laryngoscope Size: Mac and 3 Grade View: Grade I Tube type: Oral Tube size: 7.0 mm Number of attempts: 1 Airway Equipment and Method: Stylet Placement Confirmation: ETT inserted through vocal cords under direct vision,  positive ETCO2 and breath sounds checked- equal and bilateral Secured at: 21 cm Tube secured with: Tape Dental Injury: Teeth and Oropharynx as per pre-operative assessment

## 2017-11-23 NOTE — Consult Note (Signed)
Chief Complaint: Patient was seen in consultation today for port a cath placement and possible liver lesion vs para aortic lymph node biopsy  Referring Physician(s): Gorsuch,N  Supervising Physician: Marybelle Killings  Patient Status: [facility] - In-pt  History of Present Illness: Brooke Washington is a 77 y.o. female with prior history of follicular lymphoma in 2297, status post chemotherapy.  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  PET scan done yesterday reveals new hypermetabolic lymphadenopathy within the chest and abdomen , new large hypermetabolic mass in the right hepatic lobe, new small hypermetabolic mass in left kidney as well as small hypermetabolic foci in the right posterior chest wall all concerning for recurrent lymphoma.  Request now received from oncology for Port-A-Cath placement as well as possible liver lesion/lymph node biopsy for further evaluation.  Past Medical History:  Diagnosis Date  . Cancer ([place])  APRIL OF 2000   LOW-GRADE NON-HODGKIN'S LYMPHOMA  . COPD (chronic obstructive pulmonary disease) ([place])   . Emphysema of lung ([place])   . Hyperlipidemia     Past Surgical History:  Procedure Laterality Date  . 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 RIGHT OOPHORECTOMY  1976    Allergies: Contrast media [iodinated diagnostic agents]; Iohexol; Povidone-iodine; Penicillins; and Adhesive [tape]  Medications: Prior to Admission medications   Medication Sig Start Date End Date Taking? Authorizing Provider  aspirin 81 MG tablet Take 81 mg by mouth daily.   Yes [provider]  citalopram (CELEXA) 40 MG tablet Take 1 tablet (40 mg total) by mouth daily. 05/17/17  Yes Nafziger, Tommi Rumps, NP  fluticasone furoate-vilanterol  (BREO ELLIPTA) 100-25 MCG/INH AEPB Inhale 1 puff into the lungs every morning. 07/05/17  Yes Tanda Rockers, MD  HYDROcodone-acetaminophen (NORCO/VICODIN) 5-325 MG tablet Take 1 tablet by mouth every 4 (four) hours as needed for moderate pain. 11/21/17  Yes Gorsuch, Ni, MD  ibuprofen (ADVIL,MOTRIN) 200 MG tablet Take 200 mg by mouth every 6 (six) hours as needed for moderate pain.   Yes [provider]  pantoprazole (PROTONIX) 40 MG tablet Take 1 tablet (40 mg total) by mouth daily. 10/12/17  Yes Nafziger, Tommi Rumps, NP  ondansetron (ZOFRAN) 8 MG tablet Take 1 tablet (8 mg total) by mouth every 8 (eight) hours as needed for nausea. 11/21/17   [name], MD     Family History  Problem Relation Age of Onset  . Cancer Mother        colon ca    Social History   Socioeconomic History  . Marital status: Married    Spouse name: Not on file  . Number of children: Not on file  . Years of education: Not on file  . Highest education level: Not on file  Social Needs  . Financial resource strain: Not on file  . Food insecurity - worry: Not on file  . Food insecurity - inability: Not on file  . Transportation needs - medical: Not on file  . Transportation needs - non-medical: Not on file  Occupational History  . Not on file  Tobacco Use  . Smoking status: Former Smoker    Packs/day: 0.25    Years: 51.00    Pack years: 12.75    Types: Cigarettes    Last attempt to quit: 03/12/2015  Years since quitting: 2.7  . Smokeless tobacco: Never Used  Substance and Sexual Activity  . Alcohol use: Yes    Alcohol/week: 0.0 oz    Comment: 1 glass of wine monthly  . Drug use: No  . Sexual activity: Not on file  Other Topics Concern  . 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        Pets: Dog      Diet: Healthy diet, fruits, veggies and fish.    Exercise: None         Review of Systems currently denies fever, headache, chest pain, cough, abdominal pain, back pain, nausea, vomiting or bleeding.  She does have some occasional dyspnea with exertion and reflux.  Vital Signs: BP 118/80 (BP Location: Left Arm)   Pulse 92   Temp 97.8 F (36.6 C) (Oral)   Resp 18   Ht 5\' 4"  (1.626 m)   Wt 131 lb 3.2 oz (59.5 kg)   SpO2 95%   BMI 22.52 kg/m   Physical Exam awake, alert.  Chest clear to auscultation bilaterally.  Heart with regular rate and rhythm.  Abdomen soft, positive bowel sounds, nontender.  No lower extremity edema.  Right arm in sling.  Imaging: Dg Shoulder Right  Result Date: 11/15/2017 CLINICAL DATA:  Left arm pain for the past 3 weeks.  No injury. EXAM: RIGHT SHOULDER - 2+ VIEW; RIGHT HUMERUS - 2+ VIEW COMPARISON:  None. FINDINGS: There is a acute, oblique fracture through the proximal humeral diaphysis with minimal lateral and posterior displacement. No additional fracture seen. Mild acromioclavicular osteoarthritis. Diffuse osteopenia. IMPRESSION: 1. Acute, minimally displaced fracture of the proximal humeral diaphysis. Electronically Signed   By: Titus Dubin M.D.   On: 11/15/2017 10:16   Mr Humerus Right W Wo Contrast  Result Date: 11/20/2017 CLINICAL DATA:  Nontraumatic fracture of the right humerus. History of lymphoma. Creatinine was obtained on site at [facility] at [address] Results: Creatinine 1.2 mg/dL. EXAM: MRI OF THE RIGHT HUMERUS WITHOUT AND WITH CONTRAST TECHNIQUE: Multiplanar, multisequence MR imaging of the right upper extremity was performed before and after the administration of intravenous contrast. CONTRAST:  76mL MULTIHANCE GADOBENATE DIMEGLUMINE 529 MG/ML IV SOLN COMPARISON:  Radiographs 11/15/2017 FINDINGS: As demonstrated on the radiographs there is a displaced fracture involving the proximal humeral diaphysis with  slight angulation. The marrow is completely replaced with low T1 and heterogeneous high T2 signal intensity most likely osseous lymphoma. This also demonstrates diffuse enhancement. The epiphyseal regions are spared. Edema/inflammation/hemorrhage noted surrounding the fracture and in the musculature and in the subcutaneous tissues. The visualized scapula and clavicle are unremarkable. No obvious rib lesions. IMPRESSION: Mildly displaced and angulated pathologic fracture of the proximal humeral shaft. 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 Electronically Signed   By: Marijo Sanes M.D.   On: 11/20/2017 08:17   Nm Pet Image Restag (ps) Skull Base To Thigh  Result Date: 11/22/2017 CLINICAL DATA:  Subsequent treatment strategy for non-Hodgkin lymphoma. EXAM: NUCLEAR MEDICINE PET SKULL BASE TO THIGH TECHNIQUE: 6.7 mCi F-18 FDG was injected intravenously. Full-ring PET imaging was performed from the skull base to thigh after the radiotracer. CT  data was obtained and used for attenuation correction and anatomic localization. FASTING BLOOD GLUCOSE:  Value: 95 mg/dl COMPARISON:  CT on 09/16/2014 and PET-CT on 09/09/2009 FINDINGS: NECK:  No hypermetabolic lymph nodes or masses. CHEST: New Sub-cm hypermetabolic lymph nodes are seen in the right internal mammary chain and along the posterior wall the right mainstem bronchus, with SUV max of 11.1. New hypermetabolic activity is also seen in the right hilum, with SUV max of 5.7. No suspicious pulmonary nodules are seen on CT images. ABDOMEN/PELVIS: New large hypermetabolic mass is seen in the posterior right hepatic lobe with SUV max of 33.5. No evidence of splenic involvement. A focal hypermetabolic lesion is seen lateral midpole of the left kidney which has SUV max of 18.5. This is suspicious for renal involvement by lymphoma, however primary renal cell carcinoma cannot be excluded. Hypermetabolic lymphadenopathy is seen in the right  cardiophrenic angle, porta hepatis, and abdominal retroperitoneum in the retrocaval, aortocaval and left paraaortic spaces. Index lymphadenopathy in the porta hepatis measures 3.7 cm in short axis on image 103/4, with SUV max of 30.3. No pelvic lymphadenopathy identified. Small hiatal hernia again noted. SKELETON: Hypermetabolic mass is seen at site of pathologic fracture in the proximal humeral diaphysis, which has SUV max of 17.6. A sub-cm hypermetabolic lymph node is seen along the lateral aspect of this mass in the adjacent subcutaneous tissues with SUV max of 5.7. Small foci of hypermetabolic activity are also seen in the right posterior chest wall in the intercostal space between the right posterior 10th and 11th ribs. IMPRESSION: New hypermetabolic lymphadenopathy within the chest and abdomen, consistent with recurrent lymphoma. New large hypermetabolic mass in the right hepatic lobe, consistent with lymphoma this involvement. New small hypermetabolic mass in the left kidney, which may be due to lymphomatous involvement although primary renal cell carcinoma cannot be excluded. Hypermetabolic mass in the right humerus with pathologic fracture, consistent with lymphomatous involvement. Small hypermetabolic foci in right posterior chest wall, also suspicious for recurrent lymphoma. Electronically Signed   By: Earle Gell M.D.   On: 11/22/2017 10:47   Dg Humerus Right  Result Date: 11/15/2017 CLINICAL DATA:  Left arm pain for the past 3 weeks.  No injury. EXAM: RIGHT SHOULDER - 2+ VIEW; RIGHT HUMERUS - 2+ VIEW COMPARISON:  None. FINDINGS: There is a acute, oblique fracture through the proximal humeral diaphysis with minimal lateral and posterior displacement. No additional fracture seen. Mild acromioclavicular osteoarthritis. Diffuse osteopenia. IMPRESSION: 1. Acute, minimally displaced fracture of the proximal humeral diaphysis. Electronically Signed   By: Titus Dubin M.D.   On: 11/15/2017 10:16     Labs:  CBC: Recent Labs    12/28/16 1002 05/17/17 1208 10/12/17 1624 11/21/17 1321  WBC 7.0 6.6 7.5 7.8  HGB 14.0 14.1 13.8 13.1  HCT 41.9 41.9 41.5 38.7  PLT 263 303.0 336.0 357    COAGS: No results for input(s): INR, APTT in the last 8760 hours.  BMP: Recent Labs    12/28/16 1002 05/24/17 0842 11/21/17 1321  NA 142 141 143  K 4.6 4.6 4.3  CL  --  101 101  CO2 29 31 32*  GLUCOSE 85 86 82  BUN 20.3 15 21   CALCIUM 10.1 10.2 10.9*  CREATININE 0.9 0.84 1.02  GFRNONAA  --   --  52*  GFRAA  --   --  60*    LIVER FUNCTION TESTS: Recent Labs    12/28/16 1002 05/24/17 0842 11/21/17 1321  BILITOT 0.55 0.7  0.5  AST 20 22 59*  ALT 12 12 42  ALKPHOS 111 97 161*  PROT 6.8 5.9* 6.6  ALBUMIN 4.2 4.6 3.7    TUMOR MARKERS: No results for input(s): AFPTM, CEA, CA199, CHROMGRNA in the last 8760 hours.  Assessment and Plan: 77 y.o. female with prior history of follicular lymphoma in 9379, status post chemotherapy.  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  PET scan done yesterday reveals new hypermetabolic lymphadenopathy within the chest and abdomen , new large hypermetabolic mass in the right hepatic lobe, new small hypermetabolic mass in left kidney as well as small hypermetabolic foci in the right posterior chest wall all concerning for recurrent lymphoma, possibly with aggressive transformation to high-grade lymphoma.  Request now received from oncology for Port-A-Cath placement as well as possible liver lesion/lymph node biopsy for further evaluation.  Imaging studies and history have been reviewed by Dr. Barbie Banner.Risks and benefits discussed with the patient/spouse including, but not limited to bleeding, infection, pneumothorax, or fibrin sheath development and need for additional  procedures.  All of the patient's questions were answered, patient is agreeable to proceed. Consent signed and in chart.  Decision to proceed with above procedures now are contingent upon orthopedic surgery evaluation and timetable for possible humeral fracture repair.   Thank you for this interesting consult.  I greatly enjoyed meeting Brooke Washington and look forward to participating in their care.  A copy of this report was sent to the requesting provider on this date.  Electronically Signed: D. Rowe Robert, PA-C 11/23/2017, 9:21 AM   I spent a total of  30 minutes   in face to face in clinical consultation, greater than 50% of which was counseling/coordinating care for Port-A-Cath placement with possible liver/lymph node biopsy

## 2017-11-23 NOTE — Anesthesia Preprocedure Evaluation (Addendum)
Anesthesia Evaluation    Airway Mallampati: II  TM Distance: >3 FB Neck ROM: Full    Dental no notable dental hx.    Pulmonary former smoker,    Pulmonary exam normal breath sounds clear to auscultation       Cardiovascular Normal cardiovascular exam Rhythm:Regular Rate:Normal     Neuro/Psych    GI/Hepatic   Endo/Other  Hyperthyroidism   Renal/GU      Musculoskeletal   Abdominal   Peds  Hematology   Anesthesia Other Findings Recurrent lymphoma, pathological fracture of the right humerus, hypercalcemia of malignancy, high uric acid level and high tumor burden, for aggressive management of all the above  Reproductive/Obstetrics                           Anesthesia Physical Anesthesia Plan  ASA: III  Anesthesia Plan: General   Post-op Pain Management: GA combined w/ Regional for post-op pain   Induction: Intravenous  PONV Risk Score and Plan: 2 and Treatment may vary due to age or medical condition and Ondansetron  Airway Management Planned: LMA and Oral ETT  Additional Equipment:   Intra-op Plan:   Post-operative Plan: Extubation in OR  Informed Consent: I have reviewed the patients History and Physical, chart, labs and discussed the procedure including the risks, benefits and alternatives for the proposed anesthesia with the patient or authorized representative who has indicated his/her understanding and acceptance.     Plan Discussed with: CRNA, Surgeon and Anesthesiologist  Anesthesia Plan Comments: ( )        Anesthesia Quick Evaluation

## 2017-11-23 NOTE — Consult Note (Signed)
Medical Consultation   Brooke Washington  MLJ:[id number]  DOB: 1941-07-11  DOA: 11/23/2017  PCP: [name], NP   Outpatient Specialists: Oncology Dr. Alvy Bimler   Requesting physician: Dr. Alvy Bimler  Reason for consultation: Medically complex orthopedic patient   History of Present Illness: Brooke Washington is an 77 y.o. 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  Repeat staging on 11/22/2017 with PET scan showed hypermetabolic lymphadenopathy in the chest and abdomen, large hypermetabolic mass in right hepatic lobe of liver and left kidney as well as right humerus at site of pathologic fracture all consistent with lymphoma.  Patient was admitted on to oncology service and orthopedic surgery was consulted.  She is being taken to the OR today on 11/23/2017 for pathologic fracture of right humerus.  Per review of the oncology notes the plan is to let this heal and then start treatment for recurrent follicular lymphoma.  It is decided that after surgery patient will be transferred to hospitalist service for further management with consultation from orthopedics and oncology.     Review of Systems:  ROS As per HPI otherwise 10 point review of systems negative.     Past Medical History: Past Medical History:  Diagnosis Date  . Cancer ([place])  APRIL OF 2000   LOW-GRADE NON-HODGKIN'S LYMPHOMA  . COPD (chronic obstructive pulmonary disease) ([place])   . Emphysema of lung ([place])   . Hyperlipidemia     Past Surgical History: Past Surgical History:  Procedure Laterality Date  . 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 RIGHT OOPHORECTOMY  1976     Allergies:   Allergies  Allergen Reactions  . Contrast Media [Iodinated Diagnostic Agents] Shortness Of Breath, Rash and Other (See Comments)    Throat swells  . Iohexol Shortness Of Breath  . Povidone-Iodine Hives, Shortness Of Breath and Rash    REACTION: throat swells  . Penicillins Hives, Itching and Rash    Has patient had a PCN reaction causing immediate rash, facial/tongue/throat swelling, SOB or lightheadedness with hypotension: Unknown Has patient had a PCN reaction causing severe rash involving mucus membranes or skin necrosis: Yes Has patient had a PCN reaction that required hospitalization: Unknown Has patient had a PCN reaction occurring within the last 10 years: Unknown If all of the above answers are "NO", then may proceed with Cephalosporin use.   . Adhesive [Tape] Hives     Social History:  reports that she quit smoking about 2 years ago. Her smoking use included cigarettes. She has a 12.75 pack-year smoking history. she has never used smokeless tobacco. She reports that she drinks alcohol. She reports that she does not use drugs.   Family History: Family History  Problem Relation Age of Onset  . Cancer Mother        colon ca      Physical Exam: Vitals:   11/23/17 0740  BP: 118/80  Pulse: 92  Resp: 18  Temp: 97.8 F (36.6 C)  TempSrc: Oral  SpO2: 95%  Weight: 59.5 kg (131 lb 3.2 oz)  Height: 5\' 4"  (1.626 m)    Constitutional:  Well-appearing, no acute distress Eyes: Anicteric sclera ENMT: Moist mucous membranes, good dentition Neck: Supple, no appreciable lymphadenopathy CVS: Regular rate and rhythm, no murmurs rubs or gallops Respiratory: Clear to auscultation bilaterally no crackles rales or rhonchi Abdomen: soft nontender, nondistended, normal bowel sounds, no hepatosplenomegaly, no hernias  Musculoskeletal: : Arm surgical site is clean dry and intact Neuro: Intact distal sensation on  arm Psych: judgement and insight appear normal, stable mood and affect, mental status Skin: Incision site is clean dry and intact   Data reviewed:  I have personally reviewed following labs and imaging studies Labs:  CBC: Recent Labs  Lab 11/21/17 1321  WBC 7.8  NEUTROABS 5.3  HGB 13.1  HCT 38.7  MCV 95.4  PLT 485    Basic Metabolic Panel: Recent Labs  Lab 11/21/17 1321  NA 143  K 4.3  CL 101  CO2 32*  GLUCOSE 82  BUN 21  CREATININE 1.02  CALCIUM 10.9*   GFR Estimated Creatinine Clearance: 39.9 mL/min (by C-G formula based on SCr of 1.02 mg/dL). Liver Function Tests: Recent Labs  Lab 11/21/17 1321  AST 59*  ALT 42  ALKPHOS 161*  BILITOT 0.5  PROT 6.6  ALBUMIN 3.7   No results for input(s): LIPASE, AMYLASE in the last 168 hours. No results for input(s): AMMONIA in the last 168 hours. Coagulation profile Recent Labs  Lab 11/23/17 0902  INR 0.93    Cardiac Enzymes: No results for input(s): CKTOTAL, CKMB, CKMBINDEX, TROPONINI in the last 168 hours. BNP: Invalid input(s): POCBNP CBG: Recent Labs  Lab 11/22/17 0826  GLUCAP 95   D-Dimer No results for input(s): DDIMER in the last 72 hours. Hgb A1c No results for input(s): HGBA1C in the last 72 hours. Lipid Profile No results for input(s): CHOL, HDL, LDLCALC, TRIG, CHOLHDL, LDLDIRECT in the last 72 hours. Thyroid function studies No results for input(s): TSH, T4TOTAL, T3FREE, THYROIDAB in the last 72 hours.  Invalid input(s): FREET3 Anemia work up No results for input(s): VITAMINB12, FOLATE, FERRITIN, TIBC, IRON, RETICCTPCT in the last 72 hours. Urinalysis    Component Value Date/Time   COLORURINE YELLOW 01/28/2009 1035   APPEARANCEUR CLEAR 01/28/2009 1035   LABSPEC 1.011 01/28/2009 1035   PHURINE 6.5 01/28/2009 1035   GLUCOSEU NEGATIVE 01/28/2009 1035   HGBUR SMALL (A) 01/28/2009 1035   BILIRUBINUR n 04/30/2016 1055   KETONESUR NEGATIVE 01/28/2009 1035   PROTEINUR n 04/30/2016 1055    PROTEINUR NEGATIVE 01/28/2009 1035   UROBILINOGEN 0.2 04/30/2016 1055   UROBILINOGEN 0.2 01/28/2009 1035   NITRITE n 04/30/2016 1055   NITRITE NEGATIVE 01/28/2009 1035   LEUKOCYTESUR Negative 04/30/2016 1055     Sepsis Labs Invalid input(s): PROCALCITONIN,  WBC,  LACTICIDVEN Microbiology No results found for this or any previous visit (from the past 240 hour(s)).     Inpatient Medications:   Scheduled Meds: . [MAR Hold] aspirin EC  81 mg Oral Daily  . chlorhexidine  60 mL Topical Once  . [MAR Hold] cholecalciferol  1,000 Units Oral Daily  . [MAR Hold] citalopram  40 mg Oral Daily  . [MAR Hold] enoxaparin (LOVENOX) injection  40 mg Subcutaneous Q24H  . [MAR Hold] fluticasone furoate-vilanterol  1 puff Inhalation q morning - 10a  . [MAR Hold] pantoprazole  40 mg Oral Daily  . [MAR Hold] polyethylene glycol  17 g Oral Daily  . [MAR Hold] senna  2 tablet Oral QHS   Continuous Infusions: . sodium chloride 75 mL/hr at 11/23/17 0844  .  ceFAZolin (ANCEF) IV    . [MAR Hold] ondansetron (ZOFRAN) IV       Radiological Exams on Admission: Nm Pet Image Restag (ps) Skull Base To Thigh  Result Date: 11/22/2017 CLINICAL DATA:  Subsequent treatment strategy for non-Hodgkin lymphoma. EXAM: NUCLEAR MEDICINE PET SKULL BASE TO THIGH TECHNIQUE: 6.7 mCi F-18 FDG was injected intravenously. Full-ring PET imaging was performed from the skull base to thigh after the radiotracer. CT data was obtained and used for attenuation correction and anatomic localization. FASTING BLOOD GLUCOSE:  Value: 95 mg/dl COMPARISON:  CT on 09/16/2014 and PET-CT on 09/09/2009 FINDINGS: NECK:  No hypermetabolic lymph nodes or masses. CHEST: New Sub-cm hypermetabolic lymph nodes are seen in the right internal mammary chain and along the posterior wall the right mainstem bronchus, with SUV max of 11.1. New hypermetabolic activity is also seen in the right hilum, with SUV max of 5.7. No suspicious pulmonary nodules are seen on  CT images. ABDOMEN/PELVIS: New large hypermetabolic mass is seen in the posterior right hepatic lobe with SUV max of 33.5. No evidence of splenic involvement. A focal hypermetabolic lesion is seen lateral midpole of the left kidney which has SUV max of 18.5. This is suspicious for renal involvement by lymphoma, however primary renal cell carcinoma cannot be excluded. Hypermetabolic lymphadenopathy is seen in the right cardiophrenic angle, porta hepatis, and abdominal retroperitoneum in the retrocaval, aortocaval and left paraaortic spaces. Index lymphadenopathy in the porta hepatis measures 3.7 cm in short axis on image 103/4, with SUV max of 30.3. No pelvic lymphadenopathy identified. Small hiatal hernia again noted. SKELETON: Hypermetabolic mass is seen at site of pathologic fracture in the proximal humeral diaphysis, which has SUV max of 17.6. A sub-cm hypermetabolic lymph node is seen along the lateral aspect of this mass in the adjacent subcutaneous tissues with SUV max of 5.7. Small foci of hypermetabolic activity are also seen in the right posterior chest wall in the intercostal space between the right posterior 10th and 11th ribs. IMPRESSION: New hypermetabolic lymphadenopathy within the chest and abdomen, consistent with recurrent lymphoma. New large hypermetabolic mass in the right hepatic lobe, consistent with lymphoma this involvement. New small hypermetabolic mass in the left kidney, which may be due to lymphomatous involvement although primary renal cell carcinoma cannot be excluded. Hypermetabolic mass in the right humerus with pathologic fracture, consistent with lymphomatous involvement. Small hypermetabolic foci in right posterior chest wall, also suspicious for recurrent lymphoma. Electronically Signed   By: Earle Gell M.D.   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 -Significant IV hydration -Patient given 1 dose of rasburicase on 11/23/2017, will hold on allopurinol  ##) Recurrent follicular lymphoma: -Oncology following appreciate recommendations -Pending further recovery from a pathologic fracture  ##) COPD: -PRN duo nebs -Continue home inhaled corticosteroid, long-acting beta agonist  ##) Psych: -Continue citalopram 40 mg  Fluids: IV fluids per above for hydration Electrolytes: Monitor and supplemented setting of tumor lysis syndrome Nutrition: Regular diet  Prophylaxis: Enoxaparin  Disposition: Pending recovery from pathologic fracture  Full code   Thank you for this consultation.  Our [facility] hospitalist team will  follow the patient with you.   Time Spent: 45  Cristy Folks M.D. [facility] 11/23/2017, 10:53 AM

## 2017-11-23 NOTE — Progress Notes (Signed)
The patient has a history of lymphoma. New imaging suggests recurrence, possibly with a more aggressive transformation. Bx and PAC placement requested. We will see the patient for work up and plan on liver biopsy, or left para aortic LN Bx as secondary approach, if she declines ORIF right humerus frx.

## 2017-11-23 NOTE — Progress Notes (Signed)
[facility] in route to pick up patient. Report given to [facility] and Therapist, sports at [employer] at [facility].

## 2017-11-23 NOTE — Progress Notes (Signed)
Report called to Maudie Mercury, Therapist, sports at [facility]. [facility] notified of transportation need.

## 2017-11-24 ENCOUNTER — Ambulatory Visit: Payer: Medicare Other | Admitting: Family Medicine

## 2017-11-24 ENCOUNTER — Inpatient Hospital Stay (HOSPITAL_COMMUNITY): Payer: Medicare Other

## 2017-11-24 ENCOUNTER — Encounter (HOSPITAL_COMMUNITY): Payer: Self-pay | Admitting: Student

## 2017-11-24 DIAGNOSIS — I340 Nonrheumatic mitral (valve) insufficiency: Secondary | ICD-10-CM

## 2017-11-24 DIAGNOSIS — M84521A Pathological fracture in neoplastic disease, right humerus, initial encounter for fracture: Secondary | ICD-10-CM

## 2017-11-24 DIAGNOSIS — C8208 Follicular lymphoma grade I, lymph nodes of multiple sites: Secondary | ICD-10-CM

## 2017-11-24 DIAGNOSIS — G893 Neoplasm related pain (acute) (chronic): Secondary | ICD-10-CM

## 2017-11-24 LAB — COMPREHENSIVE METABOLIC PANEL
ALBUMIN: 3.3 g/dL — AB (ref 3.5–5.0)
ALT: 24 U/L (ref 14–54)
AST: 43 U/L — AB (ref 15–41)
Alkaline Phosphatase: 112 U/L (ref 38–126)
Anion gap: 11 (ref 5–15)
BILIRUBIN TOTAL: 0.4 mg/dL (ref 0.3–1.2)
BUN: 16 mg/dL (ref 6–20)
CHLORIDE: 103 mmol/L (ref 101–111)
CO2: 26 mmol/L (ref 22–32)
Calcium: 9.2 mg/dL (ref 8.9–10.3)
Creatinine, Ser: 0.89 mg/dL (ref 0.44–1.00)
GFR calc Af Amer: >60 mL/min (ref >60)
GFR calc non Af Amer: >60 mL/min (ref >60)
GLUCOSE: 109 mg/dL — AB (ref 65–99)
POTASSIUM: 4.2 mmol/L (ref 3.5–5.1)
Sodium: 140 mmol/L (ref 135–145)
TOTAL PROTEIN: 5.8 g/dL — AB (ref 6.5–8.1)

## 2017-11-24 LAB — ECHOCARDIOGRAM COMPLETE
HEIGHTINCHES: 64 in
WEIGHTICAEL: 2099.2 [oz_av]

## 2017-11-24 LAB — URIC ACID: Uric Acid, Serum: 0.8 mg/dL — ABNORMAL LOW (ref 2.3–6.6)

## 2017-11-24 MED ORDER — MORPHINE SULFATE (PF) 2 MG/ML IV SOLN: 2.0000 mg | INTRAVENOUS | Status: DC | PRN

## 2017-11-24 MED ORDER — SENNA 8.6 MG PO TABS
2.0000 | ORAL_TABLET | Freq: Two times a day (BID) | ORAL | Status: DC
  Administered 2017-11-25: 17.2 mg via ORAL
  Filled 2017-11-24 (×2): qty 2

## 2017-11-24 MED ORDER — ALLOPURINOL 300 MG PO TABS
300.0000 mg | ORAL_TABLET | Freq: Every day | ORAL | Status: DC
  Administered 2017-11-24 – 2017-11-25 (×2): 300 mg via ORAL
  Filled 2017-11-24 (×2): qty 1

## 2017-11-24 NOTE — Progress Notes (Signed)
Orthopaedic Trauma Progress Note  S: Had pain overnight when block wore off but doing better this AM  O:  Vitals:   11/24/17 0851 11/24/17 0935  BP:  112/68  Pulse:  (!) 105  Resp:  16  Temp:  97.9 F (36.6 C)  SpO2: (!) 85% 97%   RUE: Dressings clean dry and intact. Compartments soft and compressible. Neuro intact to median, radial and ulnar nerve  Labs:  CBC    Component Value Date/Time   WBC 7.8 11/21/2017 1321   RBC 4.05 11/21/2017 1321   HGB 13.1 11/21/2017 1321   HGB 14.0 12/28/2016 1002   HCT 38.7 11/21/2017 1321   HCT 41.9 12/28/2016 1002   PLT 357 11/21/2017 1321   PLT 263 12/28/2016 1002   MCV 95.4 11/21/2017 1321   MCV 95.4 12/28/2016 1002   MCH 32.4 11/21/2017 1321   MCHC 33.9 11/21/2017 1321   RDW 13.0 11/21/2017 1321   RDW 12.9 12/28/2016 1002   LYMPHSABS 1.0 11/21/2017 1321   LYMPHSABS 1.3 12/28/2016 1002   MONOABS 0.8 11/21/2017 1321   MONOABS 0.8 12/28/2016 1002   EOSABS 0.6 (H) 11/21/2017 1321   EOSABS 0.4 12/28/2016 1002   BASOSABS 0.1 11/21/2017 1321   BASOSABS 0.1 12/28/2016 1002   A/P: 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

## 2017-11-24 NOTE — Progress Notes (Signed)
Referring Physician(s): Bushnell  Supervising Physician: Corrie Mckusick  Patient Status:  [facility] - In-pt  Chief Complaint: Right arm pain, lymphoma   Subjective: Pt s/p ORIF rt humeral shaft fx/bone bx yesterday; sample obtained not adequate for eval of lymphoma; request now received for liver lesion bx; has some rt shoulder/arm discomfort but feels better than preop; constipated   Allergies: Contrast media [iodinated diagnostic agents]; Iohexol; Povidone-iodine; Penicillins; and Adhesive [tape]  Medications: Prior to Admission medications   Medication Sig Start Date End Date Taking? Authorizing Provider  aspirin 81 MG tablet Take 81 mg by mouth daily.   Yes [provider]  citalopram (CELEXA) 40 MG tablet Take 1 tablet (40 mg total) by mouth daily. 05/17/17  Yes Nafziger, Tommi Rumps, NP  fluticasone furoate-vilanterol (BREO ELLIPTA) 100-25 MCG/INH AEPB Inhale 1 puff into the lungs every morning. 07/05/17  Yes Tanda Rockers, MD  HYDROcodone-acetaminophen (NORCO/VICODIN) 5-325 MG tablet Take 1 tablet by mouth every 4 (four) hours as needed for moderate pain. 11/21/17  Yes Gorsuch, Ni, MD  ibuprofen (ADVIL,MOTRIN) 200 MG tablet Take 200 mg by mouth every 6 (six) hours as needed for moderate pain.   Yes [provider]  pantoprazole (PROTONIX) 40 MG tablet Take 1 tablet (40 mg total) by mouth daily. 10/12/17  Yes Nafziger, Tommi Rumps, NP  ondansetron (ZOFRAN) 8 MG tablet Take 1 tablet (8 mg total) by mouth every 8 (eight) hours as needed for nausea. 11/21/17   [name], MD     Vital Signs: BP 112/68 (BP Location: Left Arm)   Pulse (!) 105   Temp 97.9 F (36.6 C) (Oral)   Resp 16   Ht 5\' 4"  (1.626 m)   Wt 131 lb 3.2 oz (59.5 kg)   SpO2 97%   BMI 22.52 kg/m   Physical Exam awake/alert; chest- CTA bilat; heart- tachy but regular, abd- soft,+BS,NT; no LE edema  Imaging: Nm Pet Image Restag (ps) Skull Base To Thigh  Result Date: 11/22/2017 CLINICAL DATA:  Subsequent  treatment strategy for non-Hodgkin lymphoma. EXAM: NUCLEAR MEDICINE PET SKULL BASE TO THIGH TECHNIQUE: 6.7 mCi F-18 FDG was injected intravenously. Full-ring PET imaging was performed from the skull base to thigh after the radiotracer. CT data was obtained and used for attenuation correction and anatomic localization. FASTING BLOOD GLUCOSE:  Value: 95 mg/dl COMPARISON:  CT on 09/16/2014 and PET-CT on 09/09/2009 FINDINGS: NECK:  No hypermetabolic lymph nodes or masses. CHEST: New Sub-cm hypermetabolic lymph nodes are seen in the right internal mammary chain and along the posterior wall the right mainstem bronchus, with SUV max of 11.1. New hypermetabolic activity is also seen in the right hilum, with SUV max of 5.7. No suspicious pulmonary nodules are seen on CT images. ABDOMEN/PELVIS: New large hypermetabolic mass is seen in the posterior right hepatic lobe with SUV max of 33.5. No evidence of splenic involvement. A focal hypermetabolic lesion is seen lateral midpole of the left kidney which has SUV max of 18.5. This is suspicious for renal involvement by lymphoma, however primary renal cell carcinoma cannot be excluded. Hypermetabolic lymphadenopathy is seen in the right cardiophrenic angle, porta hepatis, and abdominal retroperitoneum in the retrocaval, aortocaval and left paraaortic spaces. Index lymphadenopathy in the porta hepatis measures 3.7 cm in short axis on image 103/4, with SUV max of 30.3. No pelvic lymphadenopathy identified. Small hiatal hernia again noted. SKELETON: Hypermetabolic mass is seen at site of pathologic fracture in the proximal humeral diaphysis, which has SUV max of 17.6. A  sub-cm hypermetabolic lymph node is seen along the lateral aspect of this mass in the adjacent subcutaneous tissues with SUV max of 5.7. Small foci of hypermetabolic activity are also seen in the right posterior chest wall in the intercostal space between the right posterior 10th and 11th ribs. IMPRESSION: New  hypermetabolic lymphadenopathy within the chest and abdomen, consistent with recurrent lymphoma. New large hypermetabolic mass in the right hepatic lobe, consistent with lymphoma this involvement. New small hypermetabolic mass in the left kidney, which may be due to lymphomatous involvement although primary renal cell carcinoma cannot be excluded. Hypermetabolic mass in the right humerus with pathologic fracture, consistent with lymphomatous involvement. Small hypermetabolic foci in right posterior chest wall, also suspicious for recurrent lymphoma. Electronically Signed   By: Earle Gell M.D.   On: 11/22/2017 10:47   Dg Humerus Right  Result Date: 11/23/2017 CLINICAL DATA:  The patient has undergone ORIF for a pathologic fracture through the junction of the proximal and mid thirds of the shaft of the right humerus. EXAM: RIGHT HUMERUS - 2+ VIEW COMPARISON:  Intraoperative fluoro spot radiographs of today's date FINDINGS: The patient has undergone placement of an intramedullary rod with securing screws for fixation of a fracture at the junction of the proximal and mid thirds of the right humerus. Alignment is now more nearly anatomic. The observed portions of the shoulder and elbow are grossly normal. IMPRESSION: No postprocedure complication following ORIF for a pathologic right humeral shaft fracture. Electronically Signed   By: David  Martinique M.D.   On: 11/23/2017 15:26   Dg Humerus Right  Result Date: 11/23/2017 CLINICAL DATA:  Lymphoma.  Pathologic right humeral fracture. EXAM: DG C-ARM 61-120 MIN; RIGHT HUMERUS - 2+ VIEW COMPARISON:  Radiographs 11/15/2017.  MRI 11/19/2017. FLUOROSCOPY TIME:  2 min and 30 sec. C-arm fluoroscopic images were obtained intraoperatively and submitted for post operative interpretation. Please see the performing provider's procedural report for the fluoroscopy time utilized. FINDINGS: Nine spot fluoroscopic images are submitted. Initial images demonstrate an angulated  pathologic fracture of the proximal right humeral diaphysis. Subsequent images demonstrate the placement of a right humeral intramedullary nail secured by 2 proximal and 1 distal interlocking screws. There is near anatomic reduction of the fracture. No complications identified. IMPRESSION: Near anatomic reduction of pathologic proximal right humeral fracture post intramedullary nailing. Electronically Signed   By: Richardean Sale M.D.   On: 11/23/2017 15:06   Dg C-arm 1-60 Min  Result Date: 11/23/2017 CLINICAL DATA:  Lymphoma.  Pathologic right humeral fracture. EXAM: DG C-ARM 61-120 MIN; RIGHT HUMERUS - 2+ VIEW COMPARISON:  Radiographs 11/15/2017.  MRI 11/19/2017. FLUOROSCOPY TIME:  2 min and 30 sec. C-arm fluoroscopic images were obtained intraoperatively and submitted for post operative interpretation. Please see the performing provider's procedural report for the fluoroscopy time utilized. FINDINGS: Nine spot fluoroscopic images are submitted. Initial images demonstrate an angulated pathologic fracture of the proximal right humeral diaphysis. Subsequent images demonstrate the placement of a right humeral intramedullary nail secured by 2 proximal and 1 distal interlocking screws. There is near anatomic reduction of the fracture. No complications identified. IMPRESSION: Near anatomic reduction of pathologic proximal right humeral fracture post intramedullary nailing. Electronically Signed   By: Richardean Sale M.D.   On: 11/23/2017 15:06    Labs:  CBC: Recent Labs    12/28/16 1002 05/17/17 1208 10/12/17 1624 11/21/17 1321  WBC 7.0 6.6 7.5 7.8  HGB 14.0 14.1 13.8 13.1  HCT 41.9 41.9 41.5 38.7  PLT 263 303.0  336.0 357    COAGS: Recent Labs    11/23/17 0902  INR 0.93    BMP: Recent Labs    12/28/16 1002 05/24/17 0842 11/21/17 1321 11/24/17 0404  NA 142 141 143 140  K 4.6 4.6 4.3 4.2  CL  --  101 101 103  CO2 29 31 32* 26  GLUCOSE 85 86 82 109*  BUN 20.3 15 21 16   CALCIUM 10.1  10.2 10.9* 9.2  CREATININE 0.9 0.84 1.02 0.89  GFRNONAA  --   --  52* >60  GFRAA  --   --  60* >60    LIVER FUNCTION TESTS: Recent Labs    12/28/16 1002 05/24/17 0842 11/21/17 1321 11/24/17 0404  BILITOT 0.55 0.7 0.5 0.4  AST 20 22 59* 43*  ALT 12 12 42 24  ALKPHOS 111 97 161* 112  PROT 6.8 5.9* 6.6 5.8*  ALBUMIN 4.2 4.6 3.7 3.3*    Assessment and Plan: 77 y.o. female with prior history of follicular lymphoma in 7253, status post chemotherapy.  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  PET scan done yesterday reveals new hypermetabolic lymphadenopathy within the chest and abdomen , new large hypermetabolic mass in the right hepatic lobe, new small hypermetabolic mass in left kidney as well as small hypermetabolic foci in the right posterior chest wall all concerning for recurrent lymphoma, possibly with aggressive transformation to high-grade lymphoma.  Bone bx during rt humeral fx repair yesterday was insufficient for lymphoma analysis; request now received for liver lesion biopsy. Imaging was reviewed again by Dr. Earleen Newport. Risks and benefits discussed with the patient including, but not limited to bleeding, infection, damage to adjacent structures or low yield requiring additional tests.  All of the patient's questions were answered, patient is agreeable to proceed. Consent signed and in chart.  Procedure tent scheduled for 2/15; lovenox to be held until after bx     Electronically Signed: D. Rowe Robert, PA-C 11/24/2017, 2:05 PM   I spent a total of 20 minutes  at the the patient's bedside AND on the patient's hospital floor or unit, greater than 50% of which was counseling/coordinating care for image guided liver lesion biopsy    [name], female   DOB: 1940/10/21, 77 y.o.    MRN: [id number]

## 2017-11-24 NOTE — Progress Notes (Signed)
  Echocardiogram 2D Echocardiogram has been performed.  Brooke Washington T Brooke Washington 11/24/2017, 8:39 AM

## 2017-11-24 NOTE — Evaluation (Addendum)
Physical Therapy Evaluation Patient Details Name: Brooke Washington MRN: [id number] DOB: 09/01/1941 Today's Date: 11/24/2017   History of Present Illness  76 y.o. female with PMH of recurrent lymphoma admitted with pathological fracture of R humerus, s/p R humerus IM nail 11/23/17  Clinical Impression  Pt admitted with above diagnosis. Pt currently with functional limitations due to the deficits listed below (see PT Problem List). Pt had significant posterior lean in standing and required up to mod assist at times to maintain balance while walking, pt stated she thinks she's off balance from pain medication, however her most recent pain medication was at 3:54 this morning, 10 hours ago. At baseline she ambulates independently and denies h/o falls in the past 1 year. At present, she requires assistance for mobility and is a high fall risk. She reports her husband can assist her at all times with mobility.   Pt will benefit from skilled PT to increase their independence and safety with mobility to allow discharge to the venue listed below.       Follow Up Recommendations Supervision/Assistance - 24 hour;Home health PT    Equipment Recommendations  None recommended by PT    Recommendations for Other Services OT consult     Precautions / Restrictions Precautions Precautions: Fall Precaution Comments: pt denies h/o falls in past 1 year, unsteady on PT eval Required Braces or Orthoses: Sling Restrictions Weight Bearing Restrictions: Yes RUE Weight Bearing: Non weight bearing      Mobility  Bed Mobility Overal bed mobility: Needs Assistance Bed Mobility: Supine to Sit;Sit to Supine     Supine to sit: Min assist Sit to supine: Min assist   General bed mobility comments: min A to raise trunk, min A for BLEs into bed  Transfers Overall transfer level: Needs assistance Equipment used: 1 person hand held assist Transfers: Sit to/from Stand Sit to Stand: Min assist          General transfer comment: posterior lean in standing  Ambulation/Gait Ambulation/Gait assistance: Min assist;Mod assist Ambulation Distance (Feet): 22 Feet Assistive device: 1 person hand held assist Gait Pattern/deviations: Decreased stride length   Gait velocity interpretation: Below normal speed for age/gender General Gait Details: min/guard to mod A for varying degree of posterior lean  Stairs            Wheelchair Mobility    Modified Rankin (Stroke Patients Only)       Balance Overall balance assessment: Needs assistance   Sitting balance-Leahy Scale: Good     Standing balance support: Single extremity supported Standing balance-Leahy Scale: Poor Standing balance comment: posterior lean while standing at bathroom sink with single UE support, required mod assist                             Pertinent Vitals/Pain Pain Assessment: 0-10 Pain Score: 3  Pain Location: RUE Pain Descriptors / Indicators: Aching Pain Intervention(s): Limited activity within patient's tolerance;Monitored during session;Repositioned    Home Living Family/patient expects to be discharged to:: Private residence Living Arrangements: Spouse/significant other Available Help at Discharge: Family;Available 24 hours/day   Home Access: Level entry     Home Layout: Two level;Able to live on main level with bedroom/bathroom Home Equipment: Cane - single point;toilet riser      Prior Function Level of Independence: Independent               Hand Dominance   Dominant Hand: Right  Extremity/Trunk Assessment   Upper Extremity Assessment Upper Extremity Assessment: Defer to OT evaluation    Lower Extremity Assessment Lower Extremity Assessment: Overall WFL for tasks assessed    Cervical / Trunk Assessment Cervical / Trunk Assessment: Normal  Communication   Communication: No difficulties  Cognition Arousal/Alertness: Awake/alert Behavior During Therapy: WFL  for tasks assessed/performed Overall Cognitive Status: Within Functional Limits for tasks assessed                                        General Comments      Exercises     Assessment/Plan    PT Assessment Patient needs continued PT services  PT Problem List Decreased activity tolerance;Decreased balance;Decreased mobility;Pain       PT Treatment Interventions DME instruction;Gait training;Therapeutic activities;Therapeutic exercise;Balance training;Patient/family education    PT Goals (Current goals can be found in the Care Plan section)  Acute Rehab PT Goals Patient Stated Goal: DC home PT Goal Formulation: With patient Time For Goal Achievement: 12/08/17 Potential to Achieve Goals: Good    Frequency Min 3X/week   Barriers to discharge        Co-evaluation               AM-PAC PT "6 Clicks" Daily Activity  Outcome Measure Difficulty turning over in bed (including adjusting bedclothes, sheets and blankets)?: A Lot Difficulty moving from lying on back to sitting on the side of the bed? : Unable Difficulty sitting down on and standing up from a chair with arms (e.g., wheelchair, bedside commode, etc,.)?: Unable Help needed moving to and from a bed to chair (including a wheelchair)?: A Lot Help needed walking in hospital room?: A Lot Help needed climbing 3-5 steps with a railing? : A Lot 6 Click Score: 10    End of Session Equipment Utilized During Treatment: Gait belt Activity Tolerance: Patient limited by fatigue Patient left: in bed;with call bell/phone within reach;with bed alarm set Nurse Communication: Mobility status PT Visit Diagnosis: Unsteadiness on feet (R26.81);Difficulty in walking, not elsewhere classified (R26.2)    Time: 8937-3428 PT Time Calculation (min) (ACUTE ONLY): 19 min   Charges:   PT Evaluation $PT Eval Moderate Complexity: 1 Mod     PT G Codes:          Philomena Doheny 11/24/2017, 2:07  PM [phone]

## 2017-11-24 NOTE — Progress Notes (Signed)
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  Orthopedics and oncology following.  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 Echocardiogram has been ordered in anticipation for Adriamycin-containing lymphoma regimen. Plan is to place the port,  lymph node biopsy, hydration and treatment of hypercalcemia and high uric acid level. Plan is to start therapy for lymphoma as soon as possible.  Pathological fracture of the right humerus: Due to neoplastic disease.  She was admitted for urgent orthopedic surgery consultation.  Status post intramedullary nailing by orthopedics. Biopsy was also taken during the procedure.  Cancer associated pain: On hydrocodone.  Hyperuricemia: Significant hyperuricemia with high LDH and high risk for tumor lysis syndrome.  Continue hydration.  Started rasburicase.  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  Started on IV fluids.  We will continue to monitor calcium level.  Constipation: We will continue current bowel regimen   DVT prophylaxis: Lovenox Code Status: Full Family Communication: None present at the bedside today Disposition Plan:    Consultants: Oncology,  orthopedics  Procedures:   Antimicrobials:None  Subjective: Patient seen and examined the bedside this morning.  Looks comfortable.  Complains of pain on the right shoulder.  Says that she has not passed stool for last 1 week  Objective: Vitals:   11/23/17 1656 11/24/17 0526 11/24/17 0851 11/24/17 0935  BP: 118/68 111/62  112/68  Pulse: 100 (!) 114  (!) 105  Resp: 20 16  16   Temp: 97.8 F (36.6 C) 98.3 F (36.8 C)  97.9 F (36.6 C)  TempSrc: Oral Oral  Oral  SpO2: 98% 92% (!) 85% 97%  Weight:      Height:        Intake/Output Summary (Last 24 hours) at 11/24/2017 1253 Last data filed at 11/24/2017 2376 Gross per 24 hour  Intake 1665 ml  Output 50 ml  Net 1615 ml   Filed Weights   11/23/17 0740  Weight: 59.5 kg (131 lb 3.2 oz)    Examination:  General exam: Appears calm and comfortable ,Not in distress,thin /frial HEENT:PERRL,Oral mucosa moist, Ear/Nose normal on gross exam Respiratory system: Bilateral equal air entry, normal vesicular breath sounds, no wheezes or crackles  Cardiovascular system: S1 & S2 heard, RRR. No JVD, murmurs, rubs, gallops or clicks. No pedal edema. Gastrointestinal system: Abdomen is nondistended, soft and nontender. No organomegaly or masses felt. Normal bowel sounds heard. Central nervous system: Alert and oriented. No focal neurological deficits. Extremities: No edema, no clubbing ,no cyanosis, distal peripheral pulses palpable. Tenderness on the right upper extremity, sling Skin: No rashes, lesions or ulcers,no icterus ,no pallor MSK: Normal muscle bulk,tone ,power Psychiatry: Judgement and insight appear normal. Mood & affect appropriate.     Data Reviewed: I have personally reviewed following labs and imaging studies  CBC: Recent Labs  Lab  11/21/17 1321  WBC 7.8  NEUTROABS 5.3  HGB 13.1  HCT 38.7  MCV 95.4  PLT 086   Basic Metabolic Panel: Recent Labs  Lab 11/21/17 1321 11/24/17 0404  NA 143 140  K 4.3 4.2  CL 101  103  CO2 32* 26  GLUCOSE 82 109*  BUN 21 16  CREATININE 1.02 0.89  CALCIUM 10.9* 9.2   GFR: Estimated Creatinine Clearance: 45.7 mL/min (by C-G formula based on SCr of 0.89 mg/dL). Liver Function Tests: Recent Labs  Lab 11/21/17 1321 11/24/17 0404  AST 59* 43*  ALT 42 24  ALKPHOS 161* 112  BILITOT 0.5 0.4  PROT 6.6 5.8*  ALBUMIN 3.7 3.3*   No results for input(s): LIPASE, AMYLASE in the last 168 hours. No results for input(s): AMMONIA in the last 168 hours. Coagulation Profile: Recent Labs  Lab 11/23/17 0902  INR 0.93   Cardiac Enzymes: No results for input(s): CKTOTAL, CKMB, CKMBINDEX, TROPONINI in the last 168 hours. BNP (last 3 results) No results for input(s): PROBNP in the last 8760 hours. HbA1C: No results for input(s): HGBA1C in the last 72 hours. CBG: Recent Labs  Lab 11/22/17 0826  GLUCAP 95   Lipid Profile: No results for input(s): CHOL, HDL, LDLCALC, TRIG, CHOLHDL, LDLDIRECT in the last 72 hours. Thyroid Function Tests: No results for input(s): TSH, T4TOTAL, FREET4, T3FREE, THYROIDAB in the last 72 hours. Anemia Panel: No results for input(s): VITAMINB12, FOLATE, FERRITIN, TIBC, IRON, RETICCTPCT in the last 72 hours. Sepsis Labs: No results for input(s): PROCALCITON, LATICACIDVEN in the last 168 hours.  Recent Results (from the past 240 hour(s))  Surgical pcr screen     Status: None   Collection Time: 11/23/17 10:02 AM  Result Value Ref Range Status   MRSA, PCR NEGATIVE NEGATIVE Final   Staphylococcus aureus NEGATIVE NEGATIVE Final    Comment: (NOTE) The Xpert SA Assay (FDA approved for NASAL specimens in patients 49 years of age and older), is one component of a comprehensive surveillance program. It is not intended to diagnose infection nor to guide or monitor treatment. Performed at [facility], [place] [address] [place], [place] [address]          Radiology Studies: Dg Humerus Right  Result Date:  11/23/2017 CLINICAL DATA:  The patient has undergone ORIF for a pathologic fracture through the junction of the proximal and mid thirds of the shaft of the right humerus. EXAM: RIGHT HUMERUS - 2+ VIEW COMPARISON:  Intraoperative fluoro spot radiographs of today's date FINDINGS: The patient has undergone placement of an intramedullary rod with securing screws for fixation of a fracture at the junction of the proximal and mid thirds of the right humerus. Alignment is now more nearly anatomic. The observed portions of the shoulder and elbow are grossly normal. IMPRESSION: No postprocedure complication following ORIF for a pathologic right humeral shaft fracture. Electronically Signed   By: David  Martinique M.D.   On: 11/23/2017 15:26   Dg Humerus Right  Result Date: 11/23/2017 CLINICAL DATA:  Lymphoma.  Pathologic right humeral fracture. EXAM: DG C-ARM 61-120 MIN; RIGHT HUMERUS - 2+ VIEW COMPARISON:  Radiographs 11/15/2017.  MRI 11/19/2017. FLUOROSCOPY TIME:  2 min and 30 sec. C-arm fluoroscopic images were obtained intraoperatively and submitted for post operative interpretation. Please see the performing provider's procedural report for the fluoroscopy time utilized. FINDINGS: Nine spot fluoroscopic images are submitted. Initial images demonstrate an angulated pathologic fracture of the proximal right humeral diaphysis. Subsequent images demonstrate the placement  of a right humeral intramedullary nail secured by 2 proximal and 1 distal interlocking screws. There is near anatomic reduction of the fracture. No complications identified. IMPRESSION: Near anatomic reduction of pathologic proximal right humeral fracture post intramedullary nailing. Electronically Signed   By: Richardean Sale M.D.   On: 11/23/2017 15:06   Dg C-arm 1-60 Min  Result Date: 11/23/2017 CLINICAL DATA:  Lymphoma.  Pathologic right humeral fracture. EXAM: DG C-ARM 61-120 MIN; RIGHT HUMERUS - 2+ VIEW COMPARISON:  Radiographs 11/15/2017.  MRI  11/19/2017. FLUOROSCOPY TIME:  2 min and 30 sec. C-arm fluoroscopic images were obtained intraoperatively and submitted for post operative interpretation. Please see the performing provider's procedural report for the fluoroscopy time utilized. FINDINGS: Nine spot fluoroscopic images are submitted. Initial images demonstrate an angulated pathologic fracture of the proximal right humeral diaphysis. Subsequent images demonstrate the placement of a right humeral intramedullary nail secured by 2 proximal and 1 distal interlocking screws. There is near anatomic reduction of the fracture. No complications identified. IMPRESSION: Near anatomic reduction of pathologic proximal right humeral fracture post intramedullary nailing. Electronically Signed   By: Richardean Sale M.D.   On: 11/23/2017 15:06        Scheduled Meds: . allopurinol  300 mg Oral Daily  . aspirin EC  81 mg Oral Daily  . citalopram  40 mg Oral Daily  . enoxaparin (LOVENOX) injection  40 mg Subcutaneous Q24H  . fluticasone furoate-vilanterol  1 puff Inhalation q morning - 10a  . pantoprazole  40 mg Oral Daily  . polyethylene glycol  17 g Oral Daily  . senna  2 tablet Oral QHS   Continuous Infusions: . sodium chloride 75 mL/hr at 11/24/17 0937  .  ceFAZolin (ANCEF) IV Stopped (11/24/17 0617)  . ondansetron (ZOFRAN) IV       LOS: 1 day    Time spent: More than 50% of that time was spent in counseling and/or coordination of care.      Marene Lenz, MD [facility] Pager [phone]  If 7PM-7AM, please contact night-coverage [email or web address] Password [facility] 11/24/2017, 12:53 PM

## 2017-11-24 NOTE — Progress Notes (Signed)
   11/24/17 1511  OT Visit Information  Last OT Received On 11/24/17  Assistance Needed +1  History of Present Illness 77 y.o. female with PMH of recurrent lymphoma admitted with pathological fracture of R humerus, s/p R humerus IM nail 11/23/17  Pain Assessment  Pain Score 4  Pain Location RUE  Pain Descriptors / Indicators Aching  Pain Intervention(s) Limited activity within patient's tolerance;Monitored during session;Repositioned;Ice applied  Cognition  Arousal/Alertness Awake/alert  Behavior During Therapy WFL for tasks assessed/performed  Overall Cognitive Status Within Functional Limits for tasks assessed  ADL  Toilet Transfer Minimal assistance;Ambulation;BSC  Toileting- Water quality scientist and Hygiene Min guard;Sitting/lateral lean  General ADL Comments pt was in bathroom when I arrived back with extension for thumb loop on sling.  Guarded her when she leaned forward for hygiene and cued not to bear any weight through RUE.  Brought gel to her for hands as she was very fatiqued.  Pt is very independent natured and she did take rest breaks during hygiene.    Bed Mobility  Sit to supine Min assist  General bed mobility comments min A for legs back into bed.  HOB raised; she has this at home  Restrictions  RUE Weight Bearing NWB  Transfers  Equipment used 1 person hand held assist  Sit to Stand Min assist  General transfer comment steadying assistance. Pt tended to "furniture walk" back to bed  OT - End of Session  Activity Tolerance Patient limited by fatigue  Patient left in bed;with call bell/phone within reach;with family/visitor present  OT Assessment/Plan  OT Visit Diagnosis Pain  Pain - Right/Left Right  Pain - part of body Shoulder  Follow Up Recommendations Supervision/Assistance - 24 hour;Home health OT  OT Equipment (has elevated toilet seat)  AM-PAC OT "6 Clicks" Daily Activity Outcome Measure  Help from another person eating meals? 3  Help from another person  taking care of personal grooming? 3  Help from another person toileting, which includes using toliet, bedpan, or urinal? 3  Help from another person bathing (including washing, rinsing, drying)? 2  Help from another person to put on and taking off regular upper body clothing? 2  Help from another person to put on and taking off regular lower body clothing? 2  6 Click Score 15  ADL G Code Conversion CK  OT Goal Progression  Progress towards OT goals Progressing toward goals  ADL Goals  Pt Will Perform Toileting - Clothing Manipulation and hygiene with supervision;sit to/from stand;sitting/lateral leans  Pt Will Perform Grooming with supervision;standing  Pt Will Transfer to Toilet with supervision;ambulating;bedside commode  Additional ADL Goal #1 pt will tolerate forward/lateral eans to move arm away from body for ADLs  OT Time Calculation  OT Start Time (ACUTE ONLY) 1420  OT Stop Time (ACUTE ONLY) 1437  OT Time Calculation (min) 17 min  OT General Charges  $OT Visit 1 Visit  OT Treatments  $Self Care/Home Management  8-22 mins  Lesle Chris, OTR/L [phone] 11/24/2017

## 2017-11-24 NOTE — Evaluation (Signed)
Occupational Therapy Evaluation Patient Details Name: Brooke Washington MRN: [id number] DOB: 04-21-1941 Today's Date: 11/24/2017    History of Present Illness 77 y.o. female with PMH of recurrent lymphoma admitted with pathological fracture of R humerus, s/p R humerus IM nail 11/23/17   Clinical Impression   Pt was admitted for the above.  At baseline, she is independent however, she has had pain for 10 days prior to sx and needed assistance from husband for adls.  She needs a lot of assistance for adls at this time due to pain, although she reports that pain has improved. Will follow in acute setting with min A level goals.      Follow Up Recommendations  Supervision/Assistance - 24 hour;Home health OT    Equipment Recommendations  (pt has toilet riser; daughter works for [employer])    Recommendations for Other Services       Precautions / Restrictions Precautions Precautions: Fall Precaution Comments: pt denies h/o falls in past 1 year, unsteady on PT eval. No ROM restrictions Required Braces or Orthoses: Sling Restrictions Weight Bearing Restrictions: Yes RUE Weight Bearing: Non weight bearing      Mobility Bed Mobility Overal bed mobility: Needs Assistance Bed Mobility: Supine to Sit;Sit to Supine     Supine to sit: Min assist Sit to supine: Min assist   General bed mobility comments: min A to raise trunk, min A for BLEs into bed  Transfers Overall transfer level: Needs assistance Equipment used: 1 person hand held assist Transfers: Sit to/from Stand Sit to Stand: Min assist         General transfer comment: light steadying assistance; pt was leaning against bed    Balance Overall balance assessment: Needs assistance   Sitting balance-Leahy Scale: Good     Standing balance support: Single extremity supported Standing balance-Leahy Scale: Poor Standing balance comment: posterior lean while standing at bathroom sink with single UE support, required mod  assist                           ADL either performed or assessed with clinical judgement   ADL Overall ADL's : Needs assistance/impaired Eating/Feeding: Set up;Sitting   Grooming: Set up;Sitting   Upper Body Bathing: Moderate assistance;Sitting   Lower Body Bathing: Maximal assistance;Sit to/from stand   Upper Body Dressing : Maximal assistance;Sitting   Lower Body Dressing: Maximal assistance;Sit to/from stand                 General ADL Comments: husband came during OT evaluation.  Reviewed UB adls to make it easier on pt. She has button and zip up tops.  Pt states she was able to move arm out enough for deodorant earlier today.  Too painful at this time.  Pt sidestepped up [place].       Vision         Perception     Praxis      Pertinent Vitals/Pain Pain Assessment: 0-10 Pain Score: 4  Pain Location: RUE Pain Descriptors / Indicators: Aching Pain Intervention(s): Limited activity within patient's tolerance;Monitored during session;Repositioned;Ice applied     Hand Dominance Right   Extremity/Trunk Assessment Upper Extremity Assessment Upper Extremity Assessment: RUE deficits/detail RUE Deficits / Details: can move fingers and wrist; elbow and shoulder are too painful.        Cervical / Trunk Assessment Cervical / Trunk Assessment: Normal   Communication Communication Communication: No difficulties   Cognition Arousal/Alertness: Awake/alert Behavior  During Therapy: WFL for tasks assessed/performed Overall Cognitive Status: Within Functional Limits for tasks assessed                                     General Comments  adjusted sling. Pt's thumb loop does not allow for forearm to rest in trough.  Cut a piece of velfoam to extend thumb loop    Exercises     Shoulder Instructions      Home Living Family/patient expects to be discharged to:: Private residence Living Arrangements: Spouse/significant other Available Help  at Discharge: Family;Available 24 hours/day   Home Access: Level entry     Home Layout: Two level;Able to live on main level with bedroom/bathroom     Bathroom Shower/Tub: Occupational psychologist: Standard     Home Equipment: Cane - single point;Toilet riser   Additional Comments: pt's daughter works for [employer].  No seat in shower      Prior Functioning/Environment Level of Independence: Independent                 OT Problem List: Decreased strength;Decreased activity tolerance;Impaired balance (sitting and/or standing);Decreased knowledge of use of DME or AE;Pain;Impaired UE functional use      OT Treatment/Interventions: Self-care/ADL training;DME and/or AE instruction;Patient/family education;Balance training;Therapeutic exercise    OT Goals(Current goals can be found in the care plan section) Acute Rehab OT Goals Patient Stated Goal: DC home OT Goal Formulation: With patient Time For Goal Achievement: 12/08/17 Potential to Achieve Goals: Good  OT Frequency: Min 2X/week   Barriers to D/C:            Co-evaluation              AM-PAC PT "6 Clicks" Daily Activity     Outcome Measure Help from another person eating meals?: A Little Help from another person taking care of personal grooming?: A Little Help from another person toileting, which includes using toliet, bedpan, or urinal?: A Little Help from another person bathing (including washing, rinsing, drying)?: A Lot Help from another person to put on and taking off regular upper body clothing?: A Lot Help from another person to put on and taking off regular lower body clothing?: A Lot 6 Click Score: 15   End of Session    Activity Tolerance: Patient tolerated treatment well Patient left: in bed;with call bell/phone within reach;with family/visitor present  OT Visit Diagnosis: Pain Pain - Right/Left: Right Pain - part of body: Shoulder                Time: 1351-1410 OT Time  Calculation (min): 19 min Charges:  OT General Charges $OT Visit: 1 Visit OT Evaluation $OT Eval Low Complexity: 1 Low G-Codes:     Wallace, OTR/L [phone] 11/24/2017  Charlann Wayne 11/24/2017, 3:08 PM

## 2017-11-24 NOTE — Progress Notes (Signed)
  Echocardiogram 2D Echocardiogram has been performed.  Brooke Washington Brooke Washington 11/24/2017, 8:39 AM

## 2017-11-24 NOTE — Progress Notes (Signed)
Brooke Washington   DOB:1941-09-23   GN#:[id number]    Subjective: The patient has some pain at the surgical site this morning.  She received both oral and IV pain medicine before her pain was well controlled.  She is constipated since Monday.  She denies nausea.  Objective:  Vitals:   11/23/17 1656 11/24/17 0526  BP: 118/68 111/62  Pulse: 100 (!) 114  Resp: 20 16  Temp: 97.8 F (36.6 C) 98.3 F (36.8 C)  SpO2: 98% 92%     Intake/Output Summary (Last 24 hours) at 11/24/2017 7846 Last data filed at 11/24/2017 9629 Gross per 24 hour  Intake 1665 ml  Output 50 ml  Net 1615 ml    GENERAL:alert, no distress and comfortable SKIN: skin color, texture, turgor are normal, no rashes or significant lesions EYES: normal, Conjunctiva are pink and non-injected, sclera clear NEURO: alert & oriented x 3 with fluent speech, no focal motor/sensory deficits   Labs:  Lab Results  Component Value Date   WBC 7.8 11/21/2017   HGB 13.1 11/21/2017   HCT 38.7 11/21/2017   MCV 95.4 11/21/2017   PLT 357 11/21/2017   NEUTROABS 5.3 11/21/2017    Lab Results  Component Value Date   NA 140 11/24/2017   K 4.2 11/24/2017   CL 103 11/24/2017   CO2 26 11/24/2017    Studies:  Nm Pet Image Restag (ps) Skull Base To Thigh  Result Date: 11/22/2017 CLINICAL DATA:  Subsequent treatment strategy for non-Hodgkin lymphoma. EXAM: NUCLEAR MEDICINE PET SKULL BASE TO THIGH TECHNIQUE: 6.7 mCi F-18 FDG was injected intravenously. Full-ring PET imaging was performed from the skull base to thigh after the radiotracer. CT data was obtained and used for attenuation correction and anatomic localization. FASTING BLOOD GLUCOSE:  Value: 95 mg/dl COMPARISON:  CT on 09/16/2014 and PET-CT on 09/09/2009 FINDINGS: NECK:  No hypermetabolic lymph nodes or masses. CHEST: New Sub-cm hypermetabolic lymph nodes are seen in the right internal mammary chain and along the posterior wall the right mainstem bronchus, with SUV max of 11.1. New  hypermetabolic activity is also seen in the right hilum, with SUV max of 5.7. No suspicious pulmonary nodules are seen on CT images. ABDOMEN/PELVIS: New large hypermetabolic mass is seen in the posterior right hepatic lobe with SUV max of 33.5. No evidence of splenic involvement. A focal hypermetabolic lesion is seen lateral midpole of the left kidney which has SUV max of 18.5. This is suspicious for renal involvement by lymphoma, however primary renal cell carcinoma cannot be excluded. Hypermetabolic lymphadenopathy is seen in the right cardiophrenic angle, porta hepatis, and abdominal retroperitoneum in the retrocaval, aortocaval and left paraaortic spaces. Index lymphadenopathy in the porta hepatis measures 3.7 cm in short axis on image 103/4, with SUV max of 30.3. No pelvic lymphadenopathy identified. Small hiatal hernia again noted. SKELETON: Hypermetabolic mass is seen at site of pathologic fracture in the proximal humeral diaphysis, which has SUV max of 17.6. A sub-cm hypermetabolic lymph node is seen along the lateral aspect of this mass in the adjacent subcutaneous tissues with SUV max of 5.7. Small foci of hypermetabolic activity are also seen in the right posterior chest wall in the intercostal space between the right posterior 10th and 11th ribs. IMPRESSION: New hypermetabolic lymphadenopathy within the chest and abdomen, consistent with recurrent lymphoma. New large hypermetabolic mass in the right hepatic lobe, consistent with lymphoma this involvement. New small hypermetabolic mass in the left kidney, which may be due to lymphomatous involvement although  primary renal cell carcinoma cannot be excluded. Hypermetabolic mass in the right humerus with pathologic fracture, consistent with lymphomatous involvement. Small hypermetabolic foci in right posterior chest wall, also suspicious for recurrent lymphoma. Electronically Signed   By: Earle Gell M.D.   On: 11/22/2017 10:47   Dg Humerus Right  Result  Date: 11/23/2017 CLINICAL DATA:  The patient has undergone ORIF for a pathologic fracture through the junction of the proximal and mid thirds of the shaft of the right humerus. EXAM: RIGHT HUMERUS - 2+ VIEW COMPARISON:  Intraoperative fluoro spot radiographs of today's date FINDINGS: The patient has undergone placement of an intramedullary rod with securing screws for fixation of a fracture at the junction of the proximal and mid thirds of the right humerus. Alignment is now more nearly anatomic. The observed portions of the shoulder and elbow are grossly normal. IMPRESSION: No postprocedure complication following ORIF for a pathologic right humeral shaft fracture. Electronically Signed   By: David  Martinique M.D.   On: 11/23/2017 15:26   Dg Humerus Right  Result Date: 11/23/2017 CLINICAL DATA:  Lymphoma.  Pathologic right humeral fracture. EXAM: DG C-ARM 61-120 MIN; RIGHT HUMERUS - 2+ VIEW COMPARISON:  Radiographs 11/15/2017.  MRI 11/19/2017. FLUOROSCOPY TIME:  2 min and 30 sec. C-arm fluoroscopic images were obtained intraoperatively and submitted for post operative interpretation. Please see the performing provider's procedural report for the fluoroscopy time utilized. FINDINGS: Nine spot fluoroscopic images are submitted. Initial images demonstrate an angulated pathologic fracture of the proximal right humeral diaphysis. Subsequent images demonstrate the placement of a right humeral intramedullary nail secured by 2 proximal and 1 distal interlocking screws. There is near anatomic reduction of the fracture. No complications identified. IMPRESSION: Near anatomic reduction of pathologic proximal right humeral fracture post intramedullary nailing. Electronically Signed   By: Richardean Sale M.D.   On: 11/23/2017 15:06   Dg C-arm 1-60 Min  Result Date: 11/23/2017 CLINICAL DATA:  Lymphoma.  Pathologic right humeral fracture. EXAM: DG C-ARM 61-120 MIN; RIGHT HUMERUS - 2+ VIEW COMPARISON:  Radiographs 11/15/2017.   MRI 11/19/2017. FLUOROSCOPY TIME:  2 min and 30 sec. C-arm fluoroscopic images were obtained intraoperatively and submitted for post operative interpretation. Please see the performing provider's procedural report for the fluoroscopy time utilized. FINDINGS: Nine spot fluoroscopic images are submitted. Initial images demonstrate an angulated pathologic fracture of the proximal right humeral diaphysis. Subsequent images demonstrate the placement of a right humeral intramedullary nail secured by 2 proximal and 1 distal interlocking screws. There is near anatomic reduction of the fracture. No complications identified. IMPRESSION: Near anatomic reduction of pathologic proximal right humeral fracture post intramedullary nailing. Electronically Signed   By: Richardean Sale M.D.   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 If not, she will need to undergo liver biopsy per discussion with interventional radiologist.  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  I recommend waiting minimum 3 weeks before starting her on chemotherapy to allow adequate wound healing  Cancer associated pain She  has poorly controlled pain She will continue hydrocodone for now along with IV morphine as needed As soon as adequate biopsiesareobtained, I will start her on high-dose steroids  Hyperuricemia, resolved She has significant hyperuricemia, high LDH and at risks of tumor lysis  syndrome Hydration and rasburicase on 11/23/2017 was able to correct hypercalcemia and hyperuricemia. Continue close monitoring. 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  Monitor closely.  I have discontinued furosemide.  DVT prophylaxis Lovenox  CODE STATUS Full code  Goals of care Hypercalcemia and hyperuricemia had resolved. She had  surgery yesterday. The next goal would be to obtain adequate biopsy before discharge  Discharge planning She will be discharged once her workup and treatment is completed.    [name], MD 11/24/2017  8:11 AM

## 2017-11-25 ENCOUNTER — Inpatient Hospital Stay (HOSPITAL_COMMUNITY): Payer: Medicare Other

## 2017-11-25 LAB — CBC WITH DIFFERENTIAL/PLATELET
BASOS ABS: 0 10*3/uL (ref 0.0–0.1)
Basophils Relative: 0 %
EOS ABS: 0.4 10*3/uL (ref 0.0–0.7)
EOS PCT: 4 %
HCT: 28.6 % — ABNORMAL LOW (ref 36.0–46.0)
Hemoglobin: 9.9 g/dL — ABNORMAL LOW (ref 12.0–15.0)
Lymphocytes Relative: 10 %
Lymphs Abs: 0.9 10*3/uL (ref 0.7–4.0)
MCH: 34 pg (ref 26.0–34.0)
MCHC: 34.6 g/dL (ref 30.0–36.0)
MCV: 98.3 fL (ref 78.0–100.0)
Monocytes Absolute: 1.7 10*3/uL — ABNORMAL HIGH (ref 0.1–1.0)
Monocytes Relative: 19 %
NEUTROS PCT: 67 %
Neutro Abs: 6 10*3/uL (ref 1.7–7.7)
PLATELETS: 275 10*3/uL (ref 150–400)
RBC: 2.91 MIL/uL — ABNORMAL LOW (ref 3.87–5.11)
RDW: 13.6 % (ref 11.5–15.5)
WBC: 9.1 10*3/uL (ref 4.0–10.5)

## 2017-11-25 LAB — BASIC METABOLIC PANEL
ANION GAP: 10 (ref 5–15)
BUN: 14 mg/dL (ref 6–20)
CALCIUM: 9.4 mg/dL (ref 8.9–10.3)
CO2: 25 mmol/L (ref 22–32)
Chloride: 105 mmol/L (ref 101–111)
Creatinine, Ser: 0.81 mg/dL (ref 0.44–1.00)
GFR calc non Af Amer: >60 mL/min (ref >60)
GLUCOSE: 105 mg/dL — AB (ref 65–99)
POTASSIUM: 3.6 mmol/L (ref 3.5–5.1)
Sodium: 140 mmol/L (ref 135–145)

## 2017-11-25 MED ORDER — MIDAZOLAM HCL 2 MG/2ML IJ SOLN
INTRAMUSCULAR | Status: AC | PRN
  Administered 2017-11-25: 1 mg via INTRAVENOUS

## 2017-11-25 MED ORDER — FENTANYL CITRATE (PF) 100 MCG/2ML IJ SOLN
INTRAMUSCULAR | Status: AC | PRN
  Administered 2017-11-25: 50 ug via INTRAVENOUS

## 2017-11-25 MED ORDER — ALLOPURINOL 300 MG PO TABS: 300.0000 mg | ORAL_TABLET | Freq: Every day | ORAL | 0 refills | Status: DC

## 2017-11-25 MED ORDER — FENTANYL CITRATE (PF) 100 MCG/2ML IJ SOLN
INTRAMUSCULAR | Status: AC
  Filled 2017-11-25: qty 2

## 2017-11-25 MED ORDER — MIDAZOLAM HCL 2 MG/2ML IJ SOLN
INTRAMUSCULAR | Status: AC
  Filled 2017-11-25: qty 2

## 2017-11-25 MED ORDER — ENOXAPARIN SODIUM 40 MG/0.4ML ~~LOC~~ SOLN: 40.0000 mg | SUBCUTANEOUS | Status: DC

## 2017-11-25 MED ORDER — SENNA 8.6 MG PO TABS: 2.0000 | ORAL_TABLET | Freq: Every day | ORAL | 0 refills | Status: DC | PRN

## 2017-11-25 MED ORDER — LIDOCAINE HCL 1 % IJ SOLN
INTRAMUSCULAR | Status: AC
  Filled 2017-11-25: qty 20

## 2017-11-25 NOTE — Care Management Note (Signed)
Case Management Note  Patient Details  Name: BARBARITA HUTMACHER MRN: [id number] Date of Birth: 07-18-1941  Subjective/Objective:   77 yo admitted with Grade 1 follicular lymphoma                 Action/Plan: From home with spouse. HHPT ordered by MD. Pt and husband offered choice and [facility] chosen. [facility] rep contacted for referral.   Expected Discharge Date:  11/25/17               Expected Discharge Plan:  [place]  In-House Referral:     Discharge planning Services  CM Consult  Post Acute Care Choice:  Home Health Choice offered to:  Patient, Spouse  DME Arranged:    DME Agency:     HH Arranged:  PT [place]:  [place]  Status of Service:  Completed, signed off  If discussed at [place] of Stay Meetings, dates discussed:    Additional CommentsLynnell Catalan, RN 11/25/2017, 2:19 PM [phone]

## 2017-11-25 NOTE — Progress Notes (Signed)
Brooke Washington   DOB:20-Jun-1941   QP#:[id number]    Subjective: She has good pain control so far.  Constipation has resolved.  Awaiting liver biopsy.  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  He recommended liver biopsy. Interventional radiologist has been consulted for liver biopsy today If no complications arise, she can be discharged later today or tomorrow morning.  I will set up follow-up appointment on 11/29/2017 to discuss results and treatment options.  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  I recommend waiting minimum 3 weeks before starting her on chemotherapy to allow adequate wound healing She will follow-up with orthopedic surgery in the outpatient setting after discharge  Cancer associated pain She will continue hydrocodone as needed.  Hyperuricemia, resolved She has significant hyperuricemia, high LDH and at risks of tumor lysis syndrome Hydration and rasburicase on 11/23/2017 was able to correct hypercalcemia and hyperuricemia. Continue close monitoring. 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  Monitor closely.  I have discontinued furosemide.  CODE STATUS Full code  Goals of care Hypercalcemia and hyperuricemia had resolved. She had surgery and will undergo liver biopsy before discharge  Discharge planning She will be discharged once her workup is completed.  I will sign off.  We will schedule outpatient follow-up next week.  Objective:  Vitals:   11/24/17 1941 11/25/17 0506  BP:  120/62 123/77  Pulse: (!) 109 87  Resp: 20 20  Temp: 98.3 F (36.8 C) 98.3 F (36.8 C)  SpO2: 96% 100%     Intake/Output Summary (Last 24 hours) at 11/25/2017 0825 Last data filed at 11/24/2017 1715 Gross per 24 hour  Intake 360 ml  Output -  Net 360 ml    GENERAL:alert, no distress and comfortable Musculoskeletal:no cyanosis of digits and no clubbing  NEURO: alert & oriented x 3 with fluent speech, no focal motor/sensory deficits   Labs:  Lab Results  Component Value Date   WBC 9.1 11/25/2017   HGB 9.9 (L) 11/25/2017   HCT 28.6 (L) 11/25/2017   MCV 98.3 11/25/2017   PLT 275 11/25/2017   NEUTROABS 6.0 11/25/2017    Lab Results  Component Value Date   NA 140 11/25/2017   K 3.6 11/25/2017   CL 105 11/25/2017   CO2 25 11/25/2017    Studies:  Dg Humerus Right  Result Date: 11/23/2017 CLINICAL DATA:  The patient has undergone ORIF for a pathologic fracture through the junction of the proximal and mid thirds of the shaft of the right humerus. EXAM: RIGHT HUMERUS - 2+ VIEW COMPARISON:  Intraoperative fluoro spot radiographs of today's date FINDINGS: The patient has undergone placement of an intramedullary rod with securing screws for fixation of a fracture at the junction of the proximal and mid thirds of the right humerus. Alignment is now more nearly anatomic. The observed portions of the shoulder and elbow are grossly normal. IMPRESSION: No postprocedure complication following ORIF for a pathologic right humeral shaft fracture. Electronically Signed   By: David  Martinique M.D.   On: 11/23/2017 15:26   Dg Humerus Right  Result Date: 11/23/2017  CLINICAL DATA:  Lymphoma.  Pathologic right humeral fracture. EXAM: DG C-ARM 61-120 MIN; RIGHT HUMERUS - 2+ VIEW COMPARISON:  Radiographs 11/15/2017.  MRI 11/19/2017. FLUOROSCOPY TIME:  2 min and 30 sec. C-arm fluoroscopic images were obtained intraoperatively and submitted for post operative interpretation. Please see the performing  provider's procedural report for the fluoroscopy time utilized. FINDINGS: Nine spot fluoroscopic images are submitted. Initial images demonstrate an angulated pathologic fracture of the proximal right humeral diaphysis. Subsequent images demonstrate the placement of a right humeral intramedullary nail secured by 2 proximal and 1 distal interlocking screws. There is near anatomic reduction of the fracture. No complications identified. IMPRESSION: Near anatomic reduction of pathologic proximal right humeral fracture post intramedullary nailing. Electronically Signed   By: Richardean Sale M.D.   On: 11/23/2017 15:06   Dg C-arm 1-60 Min  Result Date: 11/23/2017 CLINICAL DATA:  Lymphoma.  Pathologic right humeral fracture. EXAM: DG C-ARM 61-120 MIN; RIGHT HUMERUS - 2+ VIEW COMPARISON:  Radiographs 11/15/2017.  MRI 11/19/2017. FLUOROSCOPY TIME:  2 min and 30 sec. C-arm fluoroscopic images were obtained intraoperatively and submitted for post operative interpretation. Please see the performing provider's procedural report for the fluoroscopy time utilized. FINDINGS: Nine spot fluoroscopic images are submitted. Initial images demonstrate an angulated pathologic fracture of the proximal right humeral diaphysis. Subsequent images demonstrate the placement of a right humeral intramedullary nail secured by 2 proximal and 1 distal interlocking screws. There is near anatomic reduction of the fracture. No complications identified. IMPRESSION: Near anatomic reduction of pathologic proximal right humeral fracture post intramedullary nailing. Electronically Signed   By: Richardean Sale M.D.   On: 11/23/2017 15:06    [name], MD 11/25/2017  8:25 AM

## 2017-11-25 NOTE — Discharge Summary (Signed)
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  Orthopedics and oncology were  Following. Orthopedics did intramedullary nailing of the right humeral fracture.  Patient was seen by the physical therapy recommended home health.  Patient also underwent biopsy of the pathological fracture area and also liver biopsy on 11/25/17. Patient will be discharged today to home.  Patient will get Chemo-Port as an outpatient and will soon be started on chemotherapy.  She will follow-up with her oncologist as an outpatient.  Following problems were addressed during her hospitalization:   Grade 1 follicular lymphoma of lymph nodes of multiple regions: Oncology was following.  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 Started on allopurinol for hyper uricemia . Plan is to place the port as an outpatient.Plan is to start therapy for lymphoma as soon as possible. She also underwent liver biopsy.  Pathological fracture of the right humerus: Due to neoplastic disease.  She was admitted for urgent orthopedic surgery consultation.  Status post intramedullary nailing by orthopedics. Biopsy was also taken during the procedure.  Cancer associated pain: On hydrocodone.  Hyperuricemia: Significant hyperuricemia with high LDH and high risk for tumor lysis syndrome.  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  Treated with IV  fluids.  Constipation: We will continue Senna.    Discharge Diagnoses:  Active Problems:   Grade 1 follicular lymphoma of lymph nodes of multiple regions [facility])   Pathological fracture due to neoplastic disease, initial encounter   Cancer associated pain   Hypercalcemia of malignancy   Pathological fracture in neoplastic disease, right humerus, initial encounter for fracture    Discharge Instructions  Discharge Instructions    Diet - low sodium heart healthy   Complete by:  As directed    Discharge instructions   Complete by:  As directed    1) Follow up with oncology as an outpatient in the given appointment date.   Increase activity slowly   Complete by:  As directed      Allergies as of 11/25/2017      Reactions   Contrast Media [iodinated Diagnostic Agents] Shortness Of Breath, Rash, Other (See Comments)   Throat swells   Iohexol Shortness Of Breath   Povidone-iodine Hives, Shortness Of Breath, Rash   REACTION: throat swells   Penicillins Hives, Itching, Rash   Has patient had a PCN reaction causing immediate rash, facial/tongue/throat swelling, SOB or lightheadedness with hypotension: Unknown Has patient had a PCN reaction causing severe rash involving mucus membranes or skin necrosis: Yes Has patient had a PCN reaction that required hospitalization: Unknown Has patient had a PCN reaction occurring within the last 10 years: Unknown If all of the above answers are "NO", then may proceed with Cephalosporin use.   Adhesive [tape] Hives      Medication List    TAKE these medications   allopurinol 300 MG tablet Commonly known as:  ZYLOPRIM Take 1 tablet (300 mg total) by mouth daily. Start taking on:  11/26/2017   aspirin 81 MG tablet Take 81  mg by mouth daily.   citalopram 40 MG tablet Commonly known as:  CELEXA Take 1 tablet (40 mg total) by mouth daily.   fluticasone furoate-vilanterol 100-25 MCG/INH Aepb Commonly known as:  BREO ELLIPTA Inhale 1 puff  into the lungs every morning.   HYDROcodone-acetaminophen 5-325 MG tablet Commonly known as:  NORCO/VICODIN Take 1 tablet by mouth every 4 (four) hours as needed for moderate pain.   ibuprofen 200 MG tablet Commonly known as:  ADVIL,MOTRIN Take 200 mg by mouth every 6 (six) hours as needed for moderate pain.   ondansetron 8 MG tablet Commonly known as:  ZOFRAN Take 1 tablet (8 mg total) by mouth every 8 (eight) hours as needed for nausea.   pantoprazole 40 MG tablet Commonly known as:  PROTONIX Take 1 tablet (40 mg total) by mouth daily.   senna 8.6 MG Tabs tablet Commonly known as:  SENOKOT Take 2 tablets (17.2 mg total) by mouth daily as needed for mild constipation.      Follow-up Information    [name], NP. Schedule an appointment as soon as possible for a visit in 1 week(s).   Specialty:  Family Medicine Contact information: [address] [place] [address] [phone]        [name], MD. Schedule an appointment as soon as possible for a visit in 1 week(s).   Specialty:  Hematology and Oncology Contact information: [place] [address] [phone]          Allergies  Allergen Reactions  . Contrast Media [Iodinated Diagnostic Agents] Shortness Of Breath, Rash and Other (See Comments)    Throat swells  . Iohexol Shortness Of Breath  . Povidone-Iodine Hives, Shortness Of Breath and Rash    REACTION: throat swells  . Penicillins Hives, Itching and Rash    Has patient had a PCN reaction causing immediate rash, facial/tongue/throat swelling, SOB or lightheadedness with hypotension: Unknown Has patient had a PCN reaction causing severe rash involving mucus membranes or skin necrosis: Yes Has patient had a PCN reaction that required hospitalization: Unknown Has patient had a PCN reaction occurring within the last 10 years: Unknown If all of the above answers are "NO", then may proceed with Cephalosporin  use.   Dorma Russell [Tape] Hives    Consultations:  Oncology   Procedures/Studies: Dg Shoulder Right  Result Date: 11/15/2017 CLINICAL DATA:  Left arm pain for the past 3 weeks.  No injury. EXAM: RIGHT SHOULDER - 2+ VIEW; RIGHT HUMERUS - 2+ VIEW COMPARISON:  None. FINDINGS: There is a acute, oblique fracture through the proximal humeral diaphysis with minimal lateral and posterior displacement. No additional fracture seen. Mild acromioclavicular osteoarthritis. Diffuse osteopenia. IMPRESSION: 1. Acute, minimally displaced fracture of the proximal humeral diaphysis. Electronically Signed   By: Titus Dubin M.D.   On: 11/15/2017 10:16   Mr Humerus Right W Wo Contrast  Result Date: 11/20/2017 CLINICAL DATA:  Nontraumatic fracture of the right humerus. History of lymphoma. Creatinine was obtained on site at [facility] at [address] Results: Creatinine 1.2 mg/dL. EXAM: MRI OF THE RIGHT HUMERUS WITHOUT AND WITH CONTRAST TECHNIQUE: Multiplanar, multisequence MR imaging of the right upper extremity was performed before and after the administration of intravenous contrast. CONTRAST:  80mL MULTIHANCE GADOBENATE DIMEGLUMINE 529 MG/ML IV SOLN COMPARISON:  Radiographs 11/15/2017 FINDINGS: As demonstrated on the radiographs there is a displaced fracture involving the proximal humeral diaphysis with slight angulation. The marrow is completely replaced with low T1  and heterogeneous high T2 signal intensity most likely osseous lymphoma. This also demonstrates diffuse enhancement. The epiphyseal regions are spared. Edema/inflammation/hemorrhage noted surrounding the fracture and in the musculature and in the subcutaneous tissues. The visualized scapula and clavicle are unremarkable. No obvious rib lesions. IMPRESSION: Mildly displaced and angulated pathologic fracture of the proximal humeral shaft. 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 Electronically Signed   By:  Marijo Sanes M.D.   On: 11/20/2017 08:17   Nm Pet Image Restag (ps) Skull Base To Thigh  Result Date: 11/22/2017 CLINICAL DATA:  Subsequent treatment strategy for non-Hodgkin lymphoma. EXAM: NUCLEAR MEDICINE PET SKULL BASE TO THIGH TECHNIQUE: 6.7 mCi F-18 FDG was injected intravenously. Full-ring PET imaging was performed from the skull base to thigh after the radiotracer. CT data was obtained and used for attenuation correction and anatomic localization. FASTING BLOOD GLUCOSE:  Value: 95 mg/dl COMPARISON:  CT on 09/16/2014 and PET-CT on 09/09/2009 FINDINGS: NECK:  No hypermetabolic lymph nodes or masses. CHEST: New Sub-cm hypermetabolic lymph nodes are seen in the right internal mammary chain and along the posterior wall the right mainstem bronchus, with SUV max of 11.1. New hypermetabolic activity is also seen in the right hilum, with SUV max of 5.7. No suspicious pulmonary nodules are seen on CT images. ABDOMEN/PELVIS: New large hypermetabolic mass is seen in the posterior right hepatic lobe with SUV max of 33.5. No evidence of splenic involvement. A focal hypermetabolic lesion is seen lateral midpole of the left kidney which has SUV max of 18.5. This is suspicious for renal involvement by lymphoma, however primary renal cell carcinoma cannot be excluded. Hypermetabolic lymphadenopathy is seen in the right cardiophrenic angle, porta hepatis, and abdominal retroperitoneum in the retrocaval, aortocaval and left paraaortic spaces. Index lymphadenopathy in the porta hepatis measures 3.7 cm in short axis on image 103/4, with SUV max of 30.3. No pelvic lymphadenopathy identified. Small hiatal hernia again noted. SKELETON: Hypermetabolic mass is seen at site of pathologic fracture in the proximal humeral diaphysis, which has SUV max of 17.6. A sub-cm hypermetabolic lymph node is seen along the lateral aspect of this mass in the adjacent subcutaneous tissues with SUV max of 5.7. Small foci of hypermetabolic activity  are also seen in the right posterior chest wall in the intercostal space between the right posterior 10th and 11th ribs. IMPRESSION: New hypermetabolic lymphadenopathy within the chest and abdomen, consistent with recurrent lymphoma. New large hypermetabolic mass in the right hepatic lobe, consistent with lymphoma this involvement. New small hypermetabolic mass in the left kidney, which may be due to lymphomatous involvement although primary renal cell carcinoma cannot be excluded. Hypermetabolic mass in the right humerus with pathologic fracture, consistent with lymphomatous involvement. Small hypermetabolic foci in right posterior chest wall, also suspicious for recurrent lymphoma. Electronically Signed   By: Earle Gell M.D.   On: 11/22/2017 10:47   US Biopsy (liver)  Result Date: 11/25/2017 INDICATION: 77 year old female with a history of lymphoma.  Liver mass EXAM: ULTRASOUND-GUIDED LIVER MASS BIOPSY MEDICATIONS: None. ANESTHESIA/SEDATION: Moderate (conscious) sedation was employed during this procedure. A total of Versed 1.0 mg and Fentanyl 50 mcg was administered intravenously. Moderate Sedation Time: 10 minutes. The patient's level of consciousness and vital signs were monitored continuously by radiology nursing throughout the procedure under my direct supervision. FLUOROSCOPY TIME:  NONE COMPLICATIONS: NONE PROCEDURE: Informed written consent was obtained from the patient after a thorough discussion of the procedural risks, benefits and alternatives. All questions were addressed.  Maximal Sterile Barrier Technique was utilized including caps, mask, sterile gowns, sterile gloves, sterile drape, hand hygiene and skin antiseptic. A timeout was performed prior to the initiation of the procedure. Patient positioned supine position on the ultrasound stretcher. Images were stored of the upper abdomen. Patient is prepped and draped in the usual sterile fashion. 1% lidocaine was used for local anesthesia. Using  ultrasound guidance, guide needle was advanced to the liver mass. Multiple 18 gauge core biopsy were achieved. Patient tolerated the procedure well and remained hemodynamically stable throughout. No complications were encountered and no significant blood loss. IMPRESSION: Status post ultrasound-guided biopsy of liver mass. Tissue specimen sent to pathology for complete histopathologic analysis. Signed, Dulcy Fanny. Earleen Newport, DO [facility] Electronically Signed   By: Corrie Mckusick D.O.   On: 11/25/2017 09:26   Dg Humerus Right  Result Date: 11/23/2017 CLINICAL DATA:  The patient has undergone ORIF for a pathologic fracture through the junction of the proximal and mid thirds of the shaft of the right humerus. EXAM: RIGHT HUMERUS - 2+ VIEW COMPARISON:  Intraoperative fluoro spot radiographs of today's date FINDINGS: The patient has undergone placement of an intramedullary rod with securing screws for fixation of a fracture at the junction of the proximal and mid thirds of the right humerus. Alignment is now more nearly anatomic. The observed portions of the shoulder and elbow are grossly normal. IMPRESSION: No postprocedure complication following ORIF for a pathologic right humeral shaft fracture. Electronically Signed   By: David  Martinique M.D.   On: 11/23/2017 15:26   Dg Humerus Right  Result Date: 11/23/2017 CLINICAL DATA:  Lymphoma.  Pathologic right humeral fracture. EXAM: DG C-ARM 61-120 MIN; RIGHT HUMERUS - 2+ VIEW COMPARISON:  Radiographs 11/15/2017.  MRI 11/19/2017. FLUOROSCOPY TIME:  2 min and 30 sec. C-arm fluoroscopic images were obtained intraoperatively and submitted for post operative interpretation. Please see the performing provider's procedural report for the fluoroscopy time utilized. FINDINGS: Nine spot fluoroscopic images are submitted. Initial images demonstrate an angulated pathologic fracture of the proximal right humeral diaphysis.  Subsequent images demonstrate the placement of a right humeral intramedullary nail secured by 2 proximal and 1 distal interlocking screws. There is near anatomic reduction of the fracture. No complications identified. IMPRESSION: Near anatomic reduction of pathologic proximal right humeral fracture post intramedullary nailing. Electronically Signed   By: Richardean Sale M.D.   On: 11/23/2017 15:06   Dg Humerus Right  Result Date: 11/15/2017 CLINICAL DATA:  Left arm pain for the past 3 weeks.  No injury. EXAM: RIGHT SHOULDER - 2+ VIEW; RIGHT HUMERUS - 2+ VIEW COMPARISON:  None. FINDINGS: There is a acute, oblique fracture through the proximal humeral diaphysis with minimal lateral and posterior displacement. No additional fracture seen. Mild acromioclavicular osteoarthritis. Diffuse osteopenia. IMPRESSION: 1. Acute, minimally displaced fracture of the proximal humeral diaphysis. Electronically Signed   By: Titus Dubin M.D.   On: 11/15/2017 10:16   Dg C-arm 1-60 Min  Result Date: 11/23/2017 CLINICAL DATA:  Lymphoma.  Pathologic right humeral fracture. EXAM: DG C-ARM 61-120 MIN; RIGHT HUMERUS - 2+ VIEW COMPARISON:  Radiographs 11/15/2017.  MRI 11/19/2017. FLUOROSCOPY TIME:  2 min and 30 sec. C-arm fluoroscopic images were obtained intraoperatively and submitted for post operative interpretation. Please see the performing provider's procedural report for the fluoroscopy time utilized. FINDINGS: Nine spot fluoroscopic images are submitted. Initial images demonstrate an angulated pathologic fracture of the proximal right humeral diaphysis. Subsequent images demonstrate the placement of  a right humeral intramedullary nail secured by 2 proximal and 1 distal interlocking screws. There is near anatomic reduction of the fracture. No complications identified. IMPRESSION: Near anatomic reduction of pathologic proximal right humeral fracture post intramedullary nailing. Electronically Signed   By: Richardean Sale M.D.    On: 11/23/2017 15:06    (Echo, Carotid, EGD, Colonoscopy, ERCP)    Subjective:   Discharge Exam: Vitals:   11/25/17 1107 11/25/17 1122  BP: 109/66 98/63  Pulse: 80 95  Resp: 18 18  Temp: 98.2 F (36.8 C)   SpO2: 97% 98%   Vitals:   11/25/17 0845 11/25/17 1050 11/25/17 1107 11/25/17 1122  BP: (!) 115/56 112/66 109/66 98/63  Pulse: 82 88 80 95  Resp: 18 18 18 18   Temp:  98.6 F (37 C) 98.2 F (36.8 C)   TempSrc:  Oral Oral Oral  SpO2: 90% 96% 97% 98%  Weight:      Height:        General: Pt is alert, awake, not in acute distress Cardiovascular: RRR, S1/S2 +, no rubs, no gallops Respiratory: CTA bilaterally, no wheezing, no rhonchi Abdominal: Soft, NT, ND, bowel sounds + Extremities: no edema, no cyanosis Right humeral fracture, sling    The results of significant diagnostics from this hospitalization (including imaging, microbiology, ancillary and laboratory) are listed below for reference.     Microbiology: Recent Results (from the past 240 hour(s))  Surgical pcr screen     Status: None   Collection Time: 11/23/17 10:02 AM  Result Value Ref Range Status   MRSA, PCR NEGATIVE NEGATIVE Final   Staphylococcus aureus NEGATIVE NEGATIVE Final    Comment: (NOTE) The Xpert SA Assay (FDA approved for NASAL specimens in patients 48 years of age and older), is one component of a comprehensive surveillance program. It is not intended to diagnose infection nor to guide or monitor treatment. Performed at [facility], [place] [address], [place],  [address]      Labs: BNP (last 3 results) No results for input(s): BNP in the last 8760 hours. Basic Metabolic Panel: Recent Labs  Lab 11/21/17 1321 11/24/17 0404 11/25/17 0316  NA 143 140 140  K 4.3 4.2 3.6  CL 101 103 105  CO2 32* 26 25  GLUCOSE 82 109* 105*  BUN 21 16 14   CREATININE 1.02 0.89 0.81  CALCIUM 10.9* 9.2 9.4   Liver Function Tests: Recent Labs  Lab 11/21/17 1321  11/24/17 0404  AST 59* 43*  ALT 42 24  ALKPHOS 161* 112  BILITOT 0.5 0.4  PROT 6.6 5.8*  ALBUMIN 3.7 3.3*   No results for input(s): LIPASE, AMYLASE in the last 168 hours. No results for input(s): AMMONIA in the last 168 hours. CBC: Recent Labs  Lab 11/21/17 1321 11/25/17 0316  WBC 7.8 9.1  NEUTROABS 5.3 6.0  HGB 13.1 9.9*  HCT 38.7 28.6*  MCV 95.4 98.3  PLT 357 275   Cardiac Enzymes: No results for input(s): CKTOTAL, CKMB, CKMBINDEX, TROPONINI in the last 168 hours. BNP: Invalid input(s): POCBNP CBG: Recent Labs  Lab 11/22/17 0826  GLUCAP 95   D-Dimer No results for input(s): DDIMER in the last 72 hours. Hgb A1c No results for input(s): HGBA1C in the last 72 hours. Lipid Profile No results for input(s): CHOL, HDL, LDLCALC, TRIG, CHOLHDL, LDLDIRECT in the last 72 hours. Thyroid function studies No results for input(s): TSH, T4TOTAL, T3FREE, THYROIDAB in the last 72 hours.  Invalid input(s): FREET3 Anemia work up  No results for input(s): VITAMINB12, FOLATE, FERRITIN, TIBC, IRON, RETICCTPCT in the last 72 hours. Urinalysis    Component Value Date/Time   COLORURINE YELLOW 01/28/2009 1035   APPEARANCEUR CLEAR 01/28/2009 1035   LABSPEC 1.011 01/28/2009 1035   PHURINE 6.5 01/28/2009 1035   GLUCOSEU NEGATIVE 01/28/2009 1035   HGBUR SMALL (A) 01/28/2009 1035   BILIRUBINUR n 04/30/2016 1055   KETONESUR NEGATIVE 01/28/2009 1035   PROTEINUR n 04/30/2016 1055   PROTEINUR NEGATIVE 01/28/2009 1035   UROBILINOGEN 0.2 04/30/2016 1055   UROBILINOGEN 0.2 01/28/2009 1035   NITRITE n 04/30/2016 1055   NITRITE NEGATIVE 01/28/2009 1035   LEUKOCYTESUR Negative 04/30/2016 1055   Sepsis Labs Invalid input(s): PROCALCITONIN,  WBC,  LACTICIDVEN Microbiology Recent Results (from the past 240 hour(s))  Surgical pcr screen     Status: None   Collection Time: 11/23/17 10:02 AM  Result Value Ref Range Status   MRSA, PCR NEGATIVE NEGATIVE Final   Staphylococcus aureus NEGATIVE  NEGATIVE Final    Comment: (NOTE) The Xpert SA Assay (FDA approved for NASAL specimens in patients 5 years of age and older), is one component of a comprehensive surveillance program. It is not intended to diagnose infection nor to guide or monitor treatment. Performed at [facility], [place] [address], [place], [place] [address]      Time coordinating discharge: Over 30 minutes  SIGNED:   Marene Lenz, MD  [facility] 11/25/2017, 2:06 PM Pager [phone]  If 7PM-7AM, please contact night-coverage [email or web address] Password [email or web address]

## 2017-11-25 NOTE — Progress Notes (Signed)
OT Cancellation Note  Patient Details Name: Brooke Washington MRN: [id number] DOB: 1941-03-08   Cancelled Treatment:     Plan is for d/c. Pt/husband feel comfortable with adls. Will sign off.    Vita Currin 11/25/2017, 2:28 PM  Lesle Chris, OTR/L [phone] 11/25/2017

## 2017-11-25 NOTE — Progress Notes (Signed)
Physical Therapy Treatment Patient Details Name: Brooke Washington MRN: [id number] DOB: [birth year] Today's Date: 11/25/2017    History of Present Illness 77 y.o. female with PMH of recurrent lymphoma admitted with pathological fracture of R humerus, s/p R humerus IM nail 11/23/17    PT Comments    Pt progressing well with mobility, she ambulated 100' with a quad cane, with no loss of balance.    Follow Up Recommendations  Home health PT;Supervision for mobility/OOB     Equipment Recommendations  None recommended by PT    Recommendations for Other Services OT consult     Precautions / Restrictions Precautions Precautions: Fall Precaution Comments: pt denies h/o falls in past 1 year, unsteady on PT eval Required Braces or Orthoses: Sling Restrictions Weight Bearing Restrictions: Yes RUE Weight Bearing: Non weight bearing    Mobility  Bed Mobility Overal bed mobility: Needs Assistance Bed Mobility: Supine to Sit;Sit to Supine     Supine to sit: Min assist     General bed mobility comments: min A to raise trunk  Transfers Overall transfer level: Needs assistance Equipment used: 1 person hand held assist Transfers: Sit to/from Stand Sit to Stand: Min assist         General transfer comment: assist to rise, VCs to maintain NWB status RUE  Ambulation/Gait Ambulation/Gait assistance: Min guard Ambulation Distance (Feet): 100 Feet Assistive device: Quad cane Gait Pattern/deviations: Decreased stride length     General Gait Details: balance much improved today, no loss of balance, pt steady with quad cane   Stairs            Wheelchair Mobility    Modified Rankin (Stroke Patients Only)       Balance Overall balance assessment: Needs assistance   Sitting balance-Leahy Scale: Good       Standing balance-Leahy Scale: Fair Standing balance comment: posterior lean while standing at bathroom sink with single UE support, required mod assist                             Cognition Arousal/Alertness: Awake/alert Behavior During Therapy: WFL for tasks assessed/performed Overall Cognitive Status: Within Functional Limits for tasks assessed                                        Exercises      General Comments        Pertinent Vitals/Pain Pain Assessment: No/denies pain    Home Living                      Prior Function            PT Goals (current goals can now be found in the care plan section) Acute Rehab PT Goals Patient Stated Goal: DC home PT Goal Formulation: With patient Time For Goal Achievement: 12/08/17 Potential to Achieve Goals: Good Progress towards PT goals: Progressing toward goals    Frequency    Min 3X/week      PT Plan Current plan remains appropriate    Co-evaluation              AM-PAC PT "6 Clicks" Daily Activity  Outcome Measure  Difficulty turning over in bed (including adjusting bedclothes, sheets and blankets)?: A Little Difficulty moving from lying on back to sitting on the side of the bed? : Unable  Difficulty sitting down on and standing up from a chair with arms (e.g., wheelchair, bedside commode, etc,.)?: Unable Help needed moving to and from a bed to chair (including a wheelchair)?: A Little Help needed walking in hospital room?: A Little Help needed climbing 3-5 steps with a railing? : A Lot 6 Click Score: 13    End of Session Equipment Utilized During Treatment: Gait belt Activity Tolerance: Patient tolerated treatment well Patient left: with call bell/phone within reach;in chair Nurse Communication: Mobility status PT Visit Diagnosis: Unsteadiness on feet (R26.81);Difficulty in walking, not elsewhere classified (R26.2)     Time: 1517-6160 PT Time Calculation (min) (ACUTE ONLY): 34 min  Charges:  $Gait Training: 8-22 mins $Therapeutic Activity: 8-22 mins                    G Codes:          Philomena Doheny 11/25/2017, 1:07 PM [phone]

## 2017-11-25 NOTE — Progress Notes (Signed)
MEDICATION-RELATED CONSULT NOTE   IR Procedure Consult - Anticoagulant/Antiplatelet PTA/Inpatient Med List Review by Pharmacist    Procedure: US guided biopsy of liver mass    Completed: 2/15 0840 am  Post-Procedural bleeding risk per IR MD assessment:  standard  Antithrombotic medications on inpatient or PTA profile prior to procedure:   LMWH 40    Recommended restart time per IR Post-Procedure Guidelines:     Plan:    LMWH 40 qday to resume 11/26/17 at 10 am  Eudelia Bunch, Pharm.D. [phone] 11/25/2017 11:32 AM

## 2017-11-25 NOTE — Progress Notes (Signed)
PT Cancellation Note  Patient Details Name: TRENISE TURAY MRN: [id number] DOB: 12-26-1940   Cancelled Treatment:    Reason Eval/Treat Not Completed: Active bedrest order(pt had biopsy this morning, is now on bedrest. Will check back later today. )   Philomena Doheny 11/25/2017, 11:33 AM [phone]

## 2017-11-25 NOTE — Procedures (Signed)
Interventional Radiology Procedure Note  Procedure: US guided biopsy of liver mass.  .  Complications: None Recommendations:  - follow up pathology - Do not submerge for 7 days - Routine wound care   Signed,  Dulcy Fanny. Earleen Newport, DO

## 2017-11-28 DIAGNOSIS — Z7982 Long term (current) use of aspirin: Secondary | ICD-10-CM | POA: Diagnosis not present

## 2017-11-28 DIAGNOSIS — Z87891 Personal history of nicotine dependence: Secondary | ICD-10-CM | POA: Diagnosis not present

## 2017-11-28 DIAGNOSIS — C8208 Follicular lymphoma grade I, lymph nodes of multiple sites: Secondary | ICD-10-CM | POA: Diagnosis not present

## 2017-11-28 DIAGNOSIS — G893 Neoplasm related pain (acute) (chronic): Secondary | ICD-10-CM | POA: Diagnosis not present

## 2017-11-28 DIAGNOSIS — E785 Hyperlipidemia, unspecified: Secondary | ICD-10-CM | POA: Diagnosis not present

## 2017-11-28 DIAGNOSIS — M84521D Pathological fracture in neoplastic disease, right humerus, subsequent encounter for fracture with routine healing: Secondary | ICD-10-CM | POA: Diagnosis not present

## 2017-11-28 DIAGNOSIS — J439 Emphysema, unspecified: Secondary | ICD-10-CM | POA: Diagnosis not present

## 2017-11-29 ENCOUNTER — Ambulatory Visit: Payer: Medicare Other | Admitting: Hematology and Oncology

## 2017-11-29 ENCOUNTER — Other Ambulatory Visit: Payer: Self-pay | Admitting: Hematology and Oncology

## 2017-11-29 ENCOUNTER — Encounter: Payer: Self-pay | Admitting: Hematology and Oncology

## 2017-11-29 ENCOUNTER — Inpatient Hospital Stay (HOSPITAL_BASED_OUTPATIENT_CLINIC_OR_DEPARTMENT_OTHER): Payer: Medicare Other | Admitting: Hematology and Oncology

## 2017-11-29 DIAGNOSIS — C8330 Diffuse large B-cell lymphoma, unspecified site: Secondary | ICD-10-CM | POA: Diagnosis not present

## 2017-11-29 DIAGNOSIS — G893 Neoplasm related pain (acute) (chronic): Secondary | ICD-10-CM

## 2017-11-29 DIAGNOSIS — C8208 Follicular lymphoma grade I, lymph nodes of multiple sites: Secondary | ICD-10-CM

## 2017-11-29 DIAGNOSIS — C8338 Diffuse large B-cell lymphoma, lymph nodes of multiple sites: Secondary | ICD-10-CM

## 2017-11-29 DIAGNOSIS — M8450XA Pathological fracture in neoplastic disease, unspecified site, initial encounter for fracture: Secondary | ICD-10-CM

## 2017-11-29 DIAGNOSIS — M84521A Pathological fracture in neoplastic disease, right humerus, initial encounter for fracture: Secondary | ICD-10-CM

## 2017-11-29 DIAGNOSIS — C8588 Other specified types of non-Hodgkin lymphoma, lymph nodes of multiple sites: Secondary | ICD-10-CM

## 2017-11-29 DIAGNOSIS — Z9221 Personal history of antineoplastic chemotherapy: Secondary | ICD-10-CM | POA: Diagnosis not present

## 2017-11-29 DIAGNOSIS — Z7189 Other specified counseling: Secondary | ICD-10-CM | POA: Insufficient documentation

## 2017-11-29 DIAGNOSIS — E790 Hyperuricemia without signs of inflammatory arthritis and tophaceous disease: Secondary | ICD-10-CM

## 2017-11-29 MED ORDER — ALLOPURINOL 300 MG PO TABS: 300.0000 mg | ORAL_TABLET | Freq: Every day | ORAL | 3 refills | Status: DC

## 2017-11-29 MED ORDER — PROCHLORPERAZINE MALEATE 10 MG PO TABS: 10.0000 mg | ORAL_TABLET | Freq: Four times a day (QID) | ORAL | 6 refills | Status: DC | PRN

## 2017-11-29 MED ORDER — PREDNISONE 20 MG PO TABS: 40.0000 mg | ORAL_TABLET | Freq: Every day | ORAL | 6 refills | Status: DC

## 2017-11-29 MED ORDER — LIDOCAINE-PRILOCAINE 2.5-2.5 % EX CREA: TOPICAL_CREAM | CUTANEOUS | 3 refills | Status: DC

## 2017-11-29 NOTE — Assessment & Plan Note (Signed)
She has received rasburicase in the hospital She will continue allopurinol

## 2017-11-29 NOTE — Assessment & Plan Note (Signed)
This is stable on current prescription pain medicine. She will continue the same

## 2017-11-29 NOTE — Assessment & Plan Note (Signed)
She had recent surgery I recommend her to call her orthopedic surgeon for wound dressing changes and post-op care

## 2017-11-29 NOTE — Assessment & Plan Note (Signed)
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 The intent is for cure. The decision was made based on publication at the [facility] It is a category 1 recommendation from NCCN.  CHOP Chemotherapy plus Rituximab Compared with CHOP Alone in Elderly Patients with Diffuse Large-B-Cell Lymphoma Jannet Askew, M.D., Rexene Edison, M.D., Ph.D., Farley Ly, M.D., Crawford Givens, M.D., Knox Royalty, M.D., Ricky Ala, M.D., Doree Fudge, M.D., Eppie Gibson Richarda Blade, M.D., Simona Huh, M.D., Ph.D., Jolene Schimke, M.D., Bishop Limbo, M.D., Evelene Croon, Evelene Croon, Ph.D., and Sallyanne Kuster, M.D. Alison Stalling J Med 2002; 346:235-242January 24, 2002DOI: 10.1056/[id number]  The rate of complete response was significantly higher in the group that received CHOP plus rituximab than in the group that received CHOP alone (76 percent vs. 63 percent, P=0.005). With a median follow-up of two years, event-free and overall survival times were significantly higher in the CHOP-plus-rituximab group (P<0.001 and P=0.007, respectively). The addition of rituximab to standard CHOP chemotherapy significantly reduced the risk of treatment failure and death (risk ratios, 0.58 [95 percent confidence interval, 0.44 to 0.77] and 0.64 [0.45 to 0.89], respectively). Clinically relevant toxicity was not significantly greater with CHOP plus rituximab.  We discussed some of the risks, benefits and side-effects of Rituximab,Cytoxan, Adriamycin,Vincristine and  Solumedrol/Prednisone.   Some of the short term side-effects included, though not limited to, risk of fatigue, weight loss, tumor lysis syndrome, risk of allergic reactions, pancytopenia, life-threatening infections, need for transfusions of blood products, nausea, vomiting, change in bowel habits, hair loss, risk of congestive heart failure, admission to hospital for various reasons, and risks of death.   Long term side-effects are also discussed including permanent damage to nerve function, chronic fatigue, and rare secondary malignancy including bone marrow disorders.   The patient is aware that the response rates discussed earlier is not guaranteed.    After a long discussion, patient made an informed decision to proceed with the prescribed plan of care.   Patient education material was dispensed The patient is at high risk for CNS relapse She will benefit from prophylactic CNS prophylaxis with IT methotrexate, starting on cycle 2 of treatment With her age, I recommend G-CSF support I recommend port placement and to start her treatment end of next week I will see her back on the day she returns for G-CSF support for further management Due to high-grade disease, I recommend starting her on prednisone 40 mg daily at the end of the week leading to her treatment date I also discussed allopurinol due to risk of tumor lysis syndrome We discussed prognosis.  I estimated overall survival to be roughly 50% in the next 5 years After 6 cycles of chemotherapy, I recommend maintenance treatment with Revlimid

## 2017-11-29 NOTE — Progress Notes (Signed)
START ON PATHWAY REGIMEN - Lymphoma and CLL     A cycle is every 21 days:     Rituximab      Cyclophosphamide      Doxorubicin      Vincristine      Prednisone   **Always confirm dose/schedule in your pharmacy ordering system**    Patient Characteristics: Diffuse Large B-Cell Lymphoma, First Line, Stage III and IV Disease Type: Not Applicable Disease Type: Diffuse Large B-Cell Lymphoma Disease Type: Not Applicable Line of therapy: First Line [place] Stage: IV Intent of Therapy: Curative Intent, Discussed with Patient

## 2017-11-29 NOTE — Assessment & Plan Note (Signed)
Hypercalcemia has resolved upon discharge from the hospital Continue hydration therapy and I will start her on prednisone

## 2017-11-29 NOTE — Assessment & Plan Note (Signed)
We discussed the goals of care The intent of treatment is for cure However, the pattern of disease suggested that she would likely relapse in her lifetime and hence the decision for maintenance treatment after induction chemotherapy is completed We discussed prognosis She has a high IPI score I estimated overall survival in 5 years to be estimated in the region of 50% or so

## 2017-11-29 NOTE — Progress Notes (Signed)
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 The intent is for cure. The decision was made based on publication at the [facility]. It is a category 1 recommendation from NCCN.  CHOP Chemotherapy plus Rituximab Compared with CHOP Alone in Elderly Patients with Diffuse Large-B-Cell Lymphoma Jannet Askew, M.D., Rexene Edison, M.D., Ph.D., Farley Ly, M.D., Crawford Givens, M.D., Knox Royalty, M.D., Ricky Ala, M.D., Doree Fudge, M.D., Eppie Gibson Richarda Blade, M.D., Simona Huh, M.D., Ph.D., Jolene Schimke, M.D., Bishop Limbo, M.D., Evelene Croon, Evelene Croon, Ph.D., and Sallyanne Kuster, M.D. Alison Stalling J Med 2002; 346:235-242January 24, 2002DOI: [id number]  The rate of complete response was significantly higher in the group that received CHOP plus rituximab than in the group that received CHOP alone (76 percent vs. 63 percent, P=0.005). With a median follow-up of two years, event-free and overall survival times were significantly higher in the CHOP-plus-rituximab group (P<0.001 and P=0.007, respectively). The addition of rituximab to standard CHOP chemotherapy significantly reduced the risk of treatment failure and death (risk ratios, 0.58 [95 percent confidence interval, 0.44 to 0.77] and 0.64 [0.45 to 0.89], respectively). Clinically relevant  toxicity was not significantly greater with CHOP plus rituximab.  We discussed some of the risks, benefits and side-effects of Rituximab,Cytoxan, Adriamycin,Vincristine and Solumedrol/Prednisone.   Some of the short term side-effects included, though not limited to, risk of fatigue, weight loss, tumor lysis syndrome, risk of allergic reactions, pancytopenia, life-threatening infections, need for transfusions of blood products, nausea, vomiting, change in bowel habits, hair loss, risk of congestive heart failure, admission to hospital for various reasons, and risks of death.   Long term side-effects are also discussed including permanent damage to nerve function, chronic fatigue, and rare secondary malignancy including bone marrow disorders.   The patient is aware that the response rates discussed earlier is not guaranteed.    After a long discussion, patient made an informed decision to proceed with the prescribed plan of care.   Patient education material was dispensed The patient is at high risk for CNS relapse She will benefit from prophylactic CNS prophylaxis with IT methotrexate, starting on cycle 2 of treatment With her age, I recommend G-CSF support I recommend port placement and to start her treatment end of next week I will see her back on the day she returns for G-CSF support for further management Due to high-grade disease, I recommend starting her on prednisone 40 mg daily at the end of the week leading to her treatment date I also discussed allopurinol due to risk of tumor lysis syndrome We discussed prognosis.  I estimated overall survival to be roughly 50% in the next 5 years After 6 cycles of chemotherapy, I recommend maintenance treatment with Revlimid  Hyperuricemia She has received rasburicase in the hospital She will continue allopurinol  Hypercalcemia of malignancy Hypercalcemia has resolved upon discharge from the hospital Continue hydration therapy and I will start  her on  prednisone  Cancer associated pain This is stable on current prescription pain medicine. She will continue the same  Pathological fracture due to neoplastic disease, initial encounter She had recent surgery I recommend her to call her orthopedic surgeon for wound dressing changes and post-op care  Goals of care, counseling/discussion We discussed the goals of care The intent of treatment is for cure However, the pattern of disease suggested that she would likely relapse in her lifetime and hence the decision for maintenance treatment after induction chemotherapy is completed We discussed prognosis She has a high IPI score I estimated overall survival in 5 years to be estimated in the region of 50% or so   No orders of the defined types were placed in this encounter.   INTERVAL HISTORY: Please see below for problem oriented charting. 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 She was treated with chlorambucil in 2000.   --2007 - 09/2008.  Treated with single agent Rituxan.   --12/2008.  She had progressive adenopathy in chest and abdomen and repeat biopsy showed low grade follicular and diffuse B cell NHL.   --01/2009 - 07/2009. She required left ureteral stent and was treated with Treanda/Rituxan x 5 cycles  from 02-2009 thru 06-2009.       11/20/2008 Imaging    Mildly displaced and angulated pathologic fracture of the proximal humeral shaft. 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       09/16/2014 Imaging    No lymphadenopathy in the chest, abdomen, or pelvis. The small right axillary lymph nodes seen on the previous exam are unchanged in the 18 month interval. Small to moderate hiatal hernia.      11/22/2017 PET scan    New hypermetabolic lymphadenopathy within the chest and abdomen, consistent with recurrent lymphoma. New large hypermetabolic mass in the right hepatic lobe, consistent with lymphoma this involvement. New small hypermetabolic mass in the left kidney, which may be due to lymphomatous involvement although primary renal cell carcinoma cannot be excluded. Hypermetabolic mass in the right humerus with pathologic fracture, consistent with lymphomatous involvement. Small hypermetabolic foci in right posterior chest wall, also suspicious for recurrent lymphoma.      11/23/2017 Procedure    Bone, fragment(s), Right Humeral Shaft Reamings - ATYPICAL LYMPHOID INFILTRATE CONSISTENT WITH NON-HODGKIN'S B CELL LYMPHOMA. - SEE ONCOLOGY TABLE. Microscopic Comment LYMPHOMA Histologic type: Non-Hodgkins lymphoma, favor large cell type. Grade (if applicable): Favor high grade. Flow cytometry: No specimen is available for analysis. Immunohistochemical stains: BCL-2, BCL-6, CD3, CD5, CD10, CD15, CD20, CD30, CD34, CD43, LCA, CD79a, CD138, Ki-67, PAX-5 and TdT with appropriate controls as performed on block 1C. Touch preps/imprints: Not performed. Comments: The sections show multiple, primarily soft tissue fragments displaying a dense lymphoid infiltrate characterized by a mixture of small round to slightly irregular lymphocytes, histiocytes, and large centroblastic appearing  lymphoid cells with partially clumped to vesicular chromatin, small nucleoli and amphophilic  to clear cytoplasm. The number of admixed large lymphoid cells is variable but they appear relatively abundant in some areas associated with clustering. No material is available for flow cytometric analysis. Hence, a large batter of immunohistochemical stains were performed and show a mixture of T and B cells but with a significant B cell component mostly composed of large lymphoid cells with positivity for CD79a, PAX-5, CD30, BCL-2, and BCL-6. LCA shows diffuse staining although some of the large lymphoid cells appear negative. No significant staining is seen with CD20, CD10, CD15, CD34, TdT, or CD138. The lack of CD20 expression in this material is possibly related to prior treatment. There is variable increased expression of Ki-67 ranging from 10 to >50% in some areas particularly where there is abundance of large lymphoid cells. There is admixed abundant T cell population in the background as seen with CD3, CD5, and CD43 with on apparent co-expression of CD5 in B cell areas. The overall findings are consistent with involvement by non-Hodgkin's B cell lymphoma and the presence of relative abundance of large lymphoid B-cells and increased Ki-67 expression favor a high grade large B cell lymphoma.      11/23/2017 Surgery    1. CPT 24516-Intramedullary nailing of right humerus fracture 2. CPT 20245-Bone biopsy, deep        11/23/2017 - 11/25/2017 Hospital Admission    She was admitted to the hospital for management of humerus fracture and aggressive lymphoma      11/24/2017 Imaging    ECHO showed LV EF: 65% -   70%      11/25/2017 Procedure    Status post ultrasound-guided biopsy of liver mass. Tissue specimen sent to pathology for complete histopathologic analysis       REVIEW OF SYSTEMS:   Constitutional: Denies fevers, chills or abnormal weight loss Eyes: Denies blurriness of vision Ears, nose, mouth, throat, and face: Denies mucositis or sore throat Respiratory: Denies cough, dyspnea or  wheezes Cardiovascular: Denies palpitation, chest discomfort or lower extremity swelling Gastrointestinal:  Denies nausea, heartburn or change in bowel habits Skin: Denies abnormal skin rashes Lymphatics: Denies new lymphadenopathy or easy bruising Neurological:Denies numbness, tingling or new weaknesses Behavioral/Psych: Mood is stable, no new changes  All other systems were reviewed with the patient and are negative.  I have reviewed the past medical history, past surgical history, social history and family history with the patient and they are unchanged from previous note.  ALLERGIES:  is allergic to contrast media [iodinated diagnostic agents]; iohexol; povidone-iodine; penicillins; and adhesive [tape].  MEDICATIONS:  Current Outpatient Medications  Medication Sig Dispense Refill  . allopurinol (ZYLOPRIM) 300 MG tablet Take 1 tablet (300 mg total) by mouth daily. 30 tablet 0  . allopurinol (ZYLOPRIM) 300 MG tablet Take 1 tablet (300 mg total) by mouth daily. 30 tablet 3  . aspirin 81 MG tablet Take 81 mg by mouth daily.    . citalopram (CELEXA) 40 MG tablet Take 1 tablet (40 mg total) by mouth daily. 90 tablet 1  . fluticasone furoate-vilanterol (BREO ELLIPTA) 100-25 MCG/INH AEPB Inhale 1 puff into the lungs every morning.    Marland Kitchen HYDROcodone-acetaminophen (NORCO/VICODIN) 5-325 MG tablet Take 1 tablet by mouth every 4 (four) hours as needed for moderate pain. 60 tablet 0  . ibuprofen (ADVIL,MOTRIN) 200 MG tablet Take 200 mg by mouth every 6 (six) hours as needed for moderate pain.    Marland Kitchen lidocaine-prilocaine (EMLA) cream  Apply to affected area once 30 g 3  . ondansetron (ZOFRAN) 8 MG tablet Take 1 tablet (8 mg total) by mouth every 8 (eight) hours as needed for nausea. 30 tablet 3  . pantoprazole (PROTONIX) 40 MG tablet Take 1 tablet (40 mg total) by mouth daily. 90 tablet 3  . predniSONE (DELTASONE) 20 MG tablet Take 2 tablets (40 mg total) by mouth daily. Start 40 mg daily on 2/23. After  chemo, take 40 mg for 4 days 60 tablet 6  . prochlorperazine (COMPAZINE) 10 MG tablet Take 1 tablet (10 mg total) by mouth every 6 (six) hours as needed (Nausea or vomiting). 30 tablet 6  . senna (SENOKOT) 8.6 MG TABS tablet Take 2 tablets (17.2 mg total) by mouth daily as needed for mild constipation. 60 each 0   No current facility-administered medications for this visit.     PHYSICAL EXAMINATION: ECOG PERFORMANCE STATUS: 1 - Symptomatic but completely ambulatory  Vitals:   11/29/17 1220  BP: 113/63  Pulse: 84  Resp: 18  Temp: (!) 97.2 F (36.2 C)  SpO2: 93%   Filed Weights   11/29/17 1220  Weight: 137 lb 4.8 oz (62.3 kg)    GENERAL:alert, no distress and comfortable SKIN: skin color, texture, turgor are normal, no rashes or significant lesions EYES: normal, Conjunctiva are pink and non-injected, sclera clear Musculoskeletal:no cyanosis of digits and no clubbing.  She has bandages with skin bruises near the surgical site NEURO: alert & oriented x 3 with fluent speech, no focal motor/sensory deficits  LABORATORY DATA:  I have reviewed the data as listed    Component Value Date/Time   NA 140 11/25/2017 0316   NA 142 12/28/2016 1002   K 3.6 11/25/2017 0316   K 4.6 12/28/2016 1002   CL 105 11/25/2017 0316   CL 101 03/28/2013 0826   CO2 25 11/25/2017 0316   CO2 29 12/28/2016 1002   GLUCOSE 105 (H) 11/25/2017 0316   GLUCOSE 85 12/28/2016 1002   GLUCOSE 84 03/28/2013 0826   BUN 14 11/25/2017 0316   BUN 20.3 12/28/2016 1002   CREATININE 0.81 11/25/2017 0316   CREATININE 0.9 12/28/2016 1002   CALCIUM 9.4 11/25/2017 0316   CALCIUM 10.1 12/28/2016 1002   PROT 5.8 (L) 11/24/2017 0404   PROT 6.8 12/28/2016 1002   ALBUMIN 3.3 (L) 11/24/2017 0404   ALBUMIN 4.2 12/28/2016 1002   AST 43 (H) 11/24/2017 0404   AST 20 12/28/2016 1002   ALT 24 11/24/2017 0404   ALT 12 12/28/2016 1002   ALKPHOS 112 11/24/2017 0404   ALKPHOS 111 12/28/2016 1002   BILITOT 0.4 11/24/2017 0404    BILITOT 0.55 12/28/2016 1002   GFRNONAA >60 11/25/2017 0316   GFRAA >60 11/25/2017 0316    No results found for: SPEP, UPEP  Lab Results  Component Value Date   WBC 9.1 11/25/2017   NEUTROABS 6.0 11/25/2017   HGB 9.9 (L) 11/25/2017   HCT 28.6 (L) 11/25/2017   MCV 98.3 11/25/2017   PLT 275 11/25/2017      Chemistry      Component Value Date/Time   NA 140 11/25/2017 0316   NA 142 12/28/2016 1002   K 3.6 11/25/2017 0316   K 4.6 12/28/2016 1002   CL 105 11/25/2017 0316   CL 101 03/28/2013 0826   CO2 25 11/25/2017 0316   CO2 29 12/28/2016 1002   BUN 14 11/25/2017 0316   BUN 20.3 12/28/2016 1002   CREATININE 0.81 11/25/2017 0316  CREATININE 0.9 12/28/2016 1002      Component Value Date/Time   CALCIUM 9.4 11/25/2017 0316   CALCIUM 10.1 12/28/2016 1002   ALKPHOS 112 11/24/2017 0404   ALKPHOS 111 12/28/2016 1002   AST 43 (H) 11/24/2017 0404   AST 20 12/28/2016 1002   ALT 24 11/24/2017 0404   ALT 12 12/28/2016 1002   BILITOT 0.4 11/24/2017 0404   BILITOT 0.55 12/28/2016 1002       RADIOGRAPHIC STUDIES: I have personally reviewed the radiological images as listed and agreed with the findings in the report. Dg Shoulder Right  Result Date: 11/15/2017 CLINICAL DATA:  Left arm pain for the past 3 weeks.  No injury. EXAM: RIGHT SHOULDER - 2+ VIEW; RIGHT HUMERUS - 2+ VIEW COMPARISON:  None. FINDINGS: There is a acute, oblique fracture through the proximal humeral diaphysis with minimal lateral and posterior displacement. No additional fracture seen. Mild acromioclavicular osteoarthritis. Diffuse osteopenia. IMPRESSION: 1. Acute, minimally displaced fracture of the proximal humeral diaphysis. Electronically Signed   By: Titus Dubin M.D.   On: 11/15/2017 10:16   Mr Humerus Right W Wo Contrast  Result Date: 11/20/2017 CLINICAL DATA:  Nontraumatic fracture of the right humerus. History of lymphoma. Creatinine was obtained on site at [place] at [address]  Results: Creatinine 1.2 mg/dL. EXAM: MRI OF THE RIGHT HUMERUS WITHOUT AND WITH CONTRAST TECHNIQUE: Multiplanar, multisequence MR imaging of the right upper extremity was performed before and after the administration of intravenous contrast. CONTRAST:  59m MULTIHANCE GADOBENATE DIMEGLUMINE 529 MG/ML IV SOLN COMPARISON:  Radiographs 11/15/2017 FINDINGS: As demonstrated on the radiographs there is a displaced fracture involving the proximal humeral diaphysis with slight angulation. The marrow is completely replaced with low T1 and heterogeneous high T2 signal intensity most likely osseous lymphoma. This also demonstrates diffuse enhancement. The epiphyseal regions are spared. Edema/inflammation/hemorrhage noted surrounding the fracture and in the musculature and in the subcutaneous tissues. The visualized scapula and clavicle are unremarkable. No obvious rib lesions. IMPRESSION: Mildly displaced and angulated pathologic fracture of the proximal humeral shaft. 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 Electronically Signed   By: PMarijo SanesM.D.   On: 11/20/2017 08:17   Nm Pet Image Restag (ps) Skull Base To Thigh  Result Date: 11/22/2017 CLINICAL DATA:  Subsequent treatment strategy for non-Hodgkin lymphoma. EXAM: NUCLEAR MEDICINE PET SKULL BASE TO THIGH TECHNIQUE: 6.7 mCi F-18 FDG was injected intravenously. Full-ring PET imaging was performed from the skull base to thigh after the radiotracer. CT data was obtained and used for attenuation correction and anatomic localization. FASTING BLOOD GLUCOSE:  Value: 95 mg/dl COMPARISON:  CT on 09/16/2014 and PET-CT on 09/09/2009 FINDINGS: NECK:  No hypermetabolic lymph nodes or masses. CHEST: New Sub-cm hypermetabolic lymph nodes are seen in the right internal mammary chain and along the posterior wall the right mainstem bronchus, with SUV max of 11.1. New hypermetabolic activity is also seen in the right hilum, with SUV max of 5.7. No  suspicious pulmonary nodules are seen on CT images. ABDOMEN/PELVIS: New large hypermetabolic mass is seen in the posterior right hepatic lobe with SUV max of 33.5. No evidence of splenic involvement. A focal hypermetabolic lesion is seen lateral midpole of the left kidney which has SUV max of 18.5. This is suspicious for renal involvement by lymphoma, however primary renal cell carcinoma cannot be excluded. Hypermetabolic lymphadenopathy is seen in the right cardiophrenic angle, porta hepatis, and abdominal retroperitoneum in the retrocaval, aortocaval and left  paraaortic spaces. Index lymphadenopathy in the porta hepatis measures 3.7 cm in short axis on image 103/4, with SUV max of 30.3. No pelvic lymphadenopathy identified. Small hiatal hernia again noted. SKELETON: Hypermetabolic mass is seen at site of pathologic fracture in the proximal humeral diaphysis, which has SUV max of 17.6. A sub-cm hypermetabolic lymph node is seen along the lateral aspect of this mass in the adjacent subcutaneous tissues with SUV max of 5.7. Small foci of hypermetabolic activity are also seen in the right posterior chest wall in the intercostal space between the right posterior 10th and 11th ribs. IMPRESSION: New hypermetabolic lymphadenopathy within the chest and abdomen, consistent with recurrent lymphoma. New large hypermetabolic mass in the right hepatic lobe, consistent with lymphoma this involvement. New small hypermetabolic mass in the left kidney, which may be due to lymphomatous involvement although primary renal cell carcinoma cannot be excluded. Hypermetabolic mass in the right humerus with pathologic fracture, consistent with lymphomatous involvement. Small hypermetabolic foci in right posterior chest wall, also suspicious for recurrent lymphoma. Electronically Signed   By: Earle Gell M.D.   On: 11/22/2017 10:47   US Biopsy (liver)  Result Date: 11/25/2017 INDICATION: 77 year old female with a history of lymphoma.   Liver mass EXAM: ULTRASOUND-GUIDED LIVER MASS BIOPSY MEDICATIONS: None. ANESTHESIA/SEDATION: Moderate (conscious) sedation was employed during this procedure. A total of Versed 1.0 mg and Fentanyl 50 mcg was administered intravenously. Moderate Sedation Time: 10 minutes. The patient's level of consciousness and vital signs were monitored continuously by radiology nursing throughout the procedure under my direct supervision. FLUOROSCOPY TIME:  NONE COMPLICATIONS: NONE PROCEDURE: Informed written consent was obtained from the patient after a thorough discussion of the procedural risks, benefits and alternatives. All questions were addressed. Maximal Sterile Barrier Technique was utilized including caps, mask, sterile gowns, sterile gloves, sterile drape, hand hygiene and skin antiseptic. A timeout was performed prior to the initiation of the procedure. Patient positioned supine position on the ultrasound stretcher. Images were stored of the upper abdomen. Patient is prepped and draped in the usual sterile fashion. 1% lidocaine was used for local anesthesia. Using ultrasound guidance, guide needle was advanced to the liver mass. Multiple 18 gauge core biopsy were achieved. Patient tolerated the procedure well and remained hemodynamically stable throughout. No complications were encountered and no significant blood loss. IMPRESSION: Status post ultrasound-guided biopsy of liver mass. Tissue specimen sent to pathology for complete histopathologic analysis. Signed, Dulcy Fanny. Earleen Newport, DO [facility] Radiology Electronically Signed   By: Corrie Mckusick D.O.   On: 11/25/2017 09:26   Dg Humerus Right  Result Date: 11/23/2017 CLINICAL DATA:  The patient has undergone ORIF for a pathologic fracture through the junction of the proximal and mid thirds of the shaft of the right humerus. EXAM: RIGHT HUMERUS - 2+ VIEW COMPARISON:  Intraoperative fluoro spot radiographs of today's  date FINDINGS: The patient has undergone placement of an intramedullary rod with securing screws for fixation of a fracture at the junction of the proximal and mid thirds of the right humerus. Alignment is now more nearly anatomic. The observed portions of the shoulder and elbow are grossly normal. IMPRESSION: No postprocedure complication following ORIF for a pathologic right humeral shaft fracture. Electronically Signed   By: David  Martinique M.D.   On: 11/23/2017 15:26   Dg Humerus Right  Result Date: 11/23/2017 CLINICAL DATA:  Lymphoma.  Pathologic right humeral fracture. EXAM: DG C-ARM 61-120 MIN; RIGHT HUMERUS - 2+ VIEW COMPARISON:  Radiographs  11/15/2017.  MRI 11/19/2017. FLUOROSCOPY TIME:  2 min and 30 sec. C-arm fluoroscopic images were obtained intraoperatively and submitted for post operative interpretation. Please see the performing provider's procedural report for the fluoroscopy time utilized. FINDINGS: Nine spot fluoroscopic images are submitted. Initial images demonstrate an angulated pathologic fracture of the proximal right humeral diaphysis. Subsequent images demonstrate the placement of a right humeral intramedullary nail secured by 2 proximal and 1 distal interlocking screws. There is near anatomic reduction of the fracture. No complications identified. IMPRESSION: Near anatomic reduction of pathologic proximal right humeral fracture post intramedullary nailing. Electronically Signed   By: Richardean Sale M.D.   On: 11/23/2017 15:06   Dg Humerus Right  Result Date: 11/15/2017 CLINICAL DATA:  Left arm pain for the past 3 weeks.  No injury. EXAM: RIGHT SHOULDER - 2+ VIEW; RIGHT HUMERUS - 2+ VIEW COMPARISON:  None. FINDINGS: There is a acute, oblique fracture through the proximal humeral diaphysis with minimal lateral and posterior displacement. No additional fracture seen. Mild acromioclavicular osteoarthritis. Diffuse osteopenia. IMPRESSION: 1. Acute, minimally displaced fracture of the  proximal humeral diaphysis. Electronically Signed   By: Titus Dubin M.D.   On: 11/15/2017 10:16   Dg C-arm 1-60 Min  Result Date: 11/23/2017 CLINICAL DATA:  Lymphoma.  Pathologic right humeral fracture. EXAM: DG C-ARM 61-120 MIN; RIGHT HUMERUS - 2+ VIEW COMPARISON:  Radiographs 11/15/2017.  MRI 11/19/2017. FLUOROSCOPY TIME:  2 min and 30 sec. C-arm fluoroscopic images were obtained intraoperatively and submitted for post operative interpretation. Please see the performing provider's procedural report for the fluoroscopy time utilized. FINDINGS: Nine spot fluoroscopic images are submitted. Initial images demonstrate an angulated pathologic fracture of the proximal right humeral diaphysis. Subsequent images demonstrate the placement of a right humeral intramedullary nail secured by 2 proximal and 1 distal interlocking screws. There is near anatomic reduction of the fracture. No complications identified. IMPRESSION: Near anatomic reduction of pathologic proximal right humeral fracture post intramedullary nailing. Electronically Signed   By: Richardean Sale M.D.   On: 11/23/2017 15:06    All questions were answered. The patient knows to call the clinic with any problems, questions or concerns. No barriers to learning was detected.  I spent 55 minutes counseling the patient face to face. The total time spent in the appointment was 70 minutes and more than 50% was on counseling and review of test results  [name], MD 11/29/2017 3:48 PM

## 2017-11-30 ENCOUNTER — Telehealth: Payer: Self-pay | Admitting: Hematology and Oncology

## 2017-11-30 ENCOUNTER — Encounter: Payer: Self-pay | Admitting: Hematology and Oncology

## 2017-11-30 ENCOUNTER — Telehealth: Payer: Self-pay | Admitting: *Deleted

## 2017-11-30 ENCOUNTER — Other Ambulatory Visit: Payer: Self-pay | Admitting: Radiology

## 2017-11-30 NOTE — Telephone Encounter (Signed)
Called regarding 3/1

## 2017-11-30 NOTE — Telephone Encounter (Signed)
-----   Message from [name], MD sent at 11/30/2017  7:59 AM EST ----- Regarding: chemo scheduling I have requested chemo to start 3/1. Not scheduled yet If IR can do CBC and CMP, we can cancel her labs appt next week

## 2017-11-30 NOTE — Progress Notes (Signed)
Received PA request for Lidocaine-Prilocaine cream.  Submitted via Cover My Meds:  LAKETIA VICKNAIR (Key: [id number])   Your information has been submitted to [place] [employer] Part D. [employer] D will review the request and will issue a decision, typically within 1-3 days from your submission. You can check the updated outcome later by reopening this request. If [employer] has not responded in 1-3 days or if you have any questions about your ePA request, please contact [place] Medicare Part D at [phone]. If you think there may be a problem with your PA request, use our live chat feature at the bottom right.

## 2017-11-30 NOTE — Telephone Encounter (Signed)
IR will draw labs on 2/27. Lab cancelled on 2/28.  Notified patient

## 2017-11-30 NOTE — Progress Notes (Signed)
Received PA determination from [facility] [employer] for Lidocaine-Prilocaine cream:     Aitana Wion Key: [id number] - PA Case ID: [id number] - Rx #: [id number] Need help? Call us at [phone]  Outcome  Approvedtoday  Your request has been approved  DrugLidocaine-Prilocaine 2.5-2.5% EX CREA  [facility] Medicare Electronic PA Form  Original Claim [id number] PA REQUIRED-PRESCRIBER CALL [facility] REQU[facility], MD CONTACT FOR PA.DRUG REQUIRES PRIOR AUTHORIZATION

## 2017-12-02 ENCOUNTER — Other Ambulatory Visit: Payer: Self-pay | Admitting: Hematology and Oncology

## 2017-12-02 ENCOUNTER — Telehealth: Payer: Self-pay | Admitting: Hematology and Oncology

## 2017-12-02 ENCOUNTER — Telehealth: Payer: Self-pay | Admitting: *Deleted

## 2017-12-02 DIAGNOSIS — C8208 Follicular lymphoma grade I, lymph nodes of multiple sites: Secondary | ICD-10-CM | POA: Diagnosis not present

## 2017-12-02 DIAGNOSIS — E785 Hyperlipidemia, unspecified: Secondary | ICD-10-CM | POA: Diagnosis not present

## 2017-12-02 DIAGNOSIS — J439 Emphysema, unspecified: Secondary | ICD-10-CM | POA: Diagnosis not present

## 2017-12-02 DIAGNOSIS — C8588 Other specified types of non-Hodgkin lymphoma, lymph nodes of multiple sites: Secondary | ICD-10-CM

## 2017-12-02 DIAGNOSIS — G893 Neoplasm related pain (acute) (chronic): Secondary | ICD-10-CM | POA: Diagnosis not present

## 2017-12-02 DIAGNOSIS — M84521D Pathological fracture in neoplastic disease, right humerus, subsequent encounter for fracture with routine healing: Secondary | ICD-10-CM | POA: Diagnosis not present

## 2017-12-02 DIAGNOSIS — Z7982 Long term (current) use of aspirin: Secondary | ICD-10-CM | POA: Diagnosis not present

## 2017-12-02 NOTE — Telephone Encounter (Signed)
Left message for patient regarding upcoming march appointment updates per provider request.

## 2017-12-02 NOTE — Telephone Encounter (Signed)
-----   Message from [name], MD sent at 12/02/2017  9:35 AM EST ----- Regarding: ir TO DRAW LABS Can you get IR to draw CBC, CMP, uric acid and LDH before port? I will place orders in [facility]

## 2017-12-05 ENCOUNTER — Other Ambulatory Visit: Payer: Self-pay | Admitting: Radiology

## 2017-12-05 NOTE — Telephone Encounter (Signed)
IR will draw labs

## 2017-12-06 ENCOUNTER — Encounter: Payer: Self-pay | Admitting: Pharmacist

## 2017-12-06 ENCOUNTER — Other Ambulatory Visit: Payer: Self-pay | Admitting: Radiology

## 2017-12-07 ENCOUNTER — Encounter (HOSPITAL_COMMUNITY): Payer: Self-pay

## 2017-12-07 ENCOUNTER — Telehealth: Payer: Self-pay | Admitting: *Deleted

## 2017-12-07 ENCOUNTER — Ambulatory Visit (HOSPITAL_COMMUNITY)
Admission: RE | Admit: 2017-12-07 | Discharge: 2017-12-07 | Disposition: A | Discharge status: Home / Self Care | Payer: Medicare Other | Source: Ambulatory Visit | Attending: Hematology and Oncology | Admitting: Hematology and Oncology

## 2017-12-07 ENCOUNTER — Other Ambulatory Visit: Payer: Self-pay | Admitting: Hematology and Oncology

## 2017-12-07 DIAGNOSIS — E785 Hyperlipidemia, unspecified: Secondary | ICD-10-CM | POA: Diagnosis not present

## 2017-12-07 DIAGNOSIS — C8338 Diffuse large B-cell lymphoma, lymph nodes of multiple sites: Secondary | ICD-10-CM

## 2017-12-07 DIAGNOSIS — Z5111 Encounter for antineoplastic chemotherapy: Secondary | ICD-10-CM | POA: Diagnosis not present

## 2017-12-07 DIAGNOSIS — Z91041 Radiographic dye allergy status: Secondary | ICD-10-CM | POA: Insufficient documentation

## 2017-12-07 DIAGNOSIS — Z880 Allergy status to penicillin: Secondary | ICD-10-CM | POA: Insufficient documentation

## 2017-12-07 DIAGNOSIS — C8590 Non-Hodgkin lymphoma, unspecified, unspecified site: Secondary | ICD-10-CM | POA: Diagnosis not present

## 2017-12-07 DIAGNOSIS — J449 Chronic obstructive pulmonary disease, unspecified: Secondary | ICD-10-CM | POA: Insufficient documentation

## 2017-12-07 DIAGNOSIS — Z7952 Long term (current) use of systemic steroids: Secondary | ICD-10-CM | POA: Diagnosis not present

## 2017-12-07 DIAGNOSIS — Z7951 Long term (current) use of inhaled steroids: Secondary | ICD-10-CM | POA: Diagnosis not present

## 2017-12-07 DIAGNOSIS — Z7982 Long term (current) use of aspirin: Secondary | ICD-10-CM | POA: Diagnosis not present

## 2017-12-07 HISTORY — PX: IR US GUIDE VASC ACCESS RIGHT: IMG2390

## 2017-12-07 HISTORY — PX: IR FLUORO GUIDE PORT INSERTION RIGHT: IMG5741

## 2017-12-07 LAB — COMPREHENSIVE METABOLIC PANEL
ALBUMIN: 3.9 g/dL (ref 3.5–5.0)
ALT: 12 U/L — AB (ref 14–54)
ANION GAP: 12 (ref 5–15)
AST: 21 U/L (ref 15–41)
Alkaline Phosphatase: 139 U/L — ABNORMAL HIGH (ref 38–126)
BUN: 15 mg/dL (ref 6–20)
CHLORIDE: 98 mmol/L — AB (ref 101–111)
CO2: 27 mmol/L (ref 22–32)
Calcium: 8.8 mg/dL — ABNORMAL LOW (ref 8.9–10.3)
Creatinine, Ser: 0.83 mg/dL (ref 0.44–1.00)
GFR calc non Af Amer: >60 mL/min (ref >60)
GLUCOSE: 109 mg/dL — AB (ref 65–99)
Potassium: 2.9 mmol/L — ABNORMAL LOW (ref 3.5–5.1)
SODIUM: 137 mmol/L (ref 135–145)
Total Bilirubin: 0.7 mg/dL (ref 0.3–1.2)
Total Protein: 6.7 g/dL (ref 6.5–8.1)

## 2017-12-07 LAB — URIC ACID: Uric Acid, Serum: 3.9 mg/dL (ref 2.3–6.6)

## 2017-12-07 LAB — CBC WITH DIFFERENTIAL/PLATELET
BASOS PCT: 0 %
Basophils Absolute: 0 10*3/uL (ref 0.0–0.1)
EOS ABS: 0 10*3/uL (ref 0.0–0.7)
EOS PCT: 0 %
HCT: 37 % (ref 36.0–46.0)
Hemoglobin: 12 g/dL (ref 12.0–15.0)
LYMPHS ABS: 0.4 10*3/uL — AB (ref 0.7–4.0)
Lymphocytes Relative: 5 %
MCH: 32.4 pg (ref 26.0–34.0)
MCHC: 32.4 g/dL (ref 30.0–36.0)
MCV: 100 fL (ref 78.0–100.0)
Monocytes Absolute: 0.4 10*3/uL (ref 0.1–1.0)
Monocytes Relative: 4 %
NEUTROS PCT: 91 %
Neutro Abs: 7.7 10*3/uL (ref 1.7–7.7)
PLATELETS: 484 10*3/uL — AB (ref 150–400)
RBC: 3.7 MIL/uL — AB (ref 3.87–5.11)
RDW: 14.9 % (ref 11.5–15.5)
WBC: 8.5 10*3/uL (ref 4.0–10.5)

## 2017-12-07 LAB — LACTATE DEHYDROGENASE: LDH: 313 U/L — ABNORMAL HIGH (ref 98–192)

## 2017-12-07 MED ORDER — SODIUM CHLORIDE 0.9 % IV SOLN
INTRAVENOUS | Status: DC
  Administered 2017-12-07: 13:00:00 via INTRAVENOUS

## 2017-12-07 MED ORDER — FENTANYL CITRATE (PF) 100 MCG/2ML IJ SOLN
INTRAMUSCULAR | Status: AC | PRN
  Administered 2017-12-07 (×3): 25 ug via INTRAVENOUS

## 2017-12-07 MED ORDER — POTASSIUM CHLORIDE CRYS ER 20 MEQ PO TBCR
40.0000 meq | EXTENDED_RELEASE_TABLET | Freq: Once | ORAL | Status: AC
  Administered 2017-12-07: 40 meq via ORAL
  Filled 2017-12-07 (×3): qty 2

## 2017-12-07 MED ORDER — HEPARIN SOD (PORK) LOCK FLUSH 100 UNIT/ML IV SOLN
INTRAVENOUS | Status: AC
  Filled 2017-12-07: qty 5

## 2017-12-07 MED ORDER — MIDAZOLAM HCL 2 MG/2ML IJ SOLN
INTRAMUSCULAR | Status: AC
  Filled 2017-12-07: qty 2

## 2017-12-07 MED ORDER — LIDOCAINE-EPINEPHRINE (PF) 2 %-1:200000 IJ SOLN
INTRAMUSCULAR | Status: AC
  Filled 2017-12-07: qty 20

## 2017-12-07 MED ORDER — LIDOCAINE-EPINEPHRINE (PF) 2 %-1:200000 IJ SOLN
INTRAMUSCULAR | Status: AC | PRN
  Administered 2017-12-07: 10 mL

## 2017-12-07 MED ORDER — CLINDAMYCIN PHOSPHATE 600 MG/50ML IV SOLN
600.0000 mg | Freq: Once | INTRAVENOUS | Status: AC
  Administered 2017-12-07: 600 mg via INTRAVENOUS
  Filled 2017-12-07: qty 50

## 2017-12-07 MED ORDER — FENTANYL CITRATE (PF) 100 MCG/2ML IJ SOLN
INTRAMUSCULAR | Status: AC
  Filled 2017-12-07: qty 2

## 2017-12-07 MED ORDER — MIDAZOLAM HCL 2 MG/2ML IJ SOLN
INTRAMUSCULAR | Status: AC | PRN
  Administered 2017-12-07: 1 mg via INTRAVENOUS

## 2017-12-07 NOTE — Sedation Documentation (Addendum)
600mg  Clindamycin IVPB per MD orders at bedside

## 2017-12-07 NOTE — Telephone Encounter (Signed)
IR wanted to let Dr Alvy Bimler know that patient's potassium is 2.9. They are going to give her K-dur 40 meq po today in IR.

## 2017-12-07 NOTE — H&P (Signed)
Referring Physician(s): [name]  Supervising Physician: Sandi Mariscal  Patient Status:  [facility] OP  Chief Complaint:  "I'm here for a port a cath"  Subjective: Patient familiar to IR service from prior liver lesion biopsy on 11/25/17.  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  She is  scheduled today for Port-A-Cath placement for chemotherapy.  She currently denies chest pain, abdominal/back pain, nausea, vomiting or bleeding.  She does have occasional headaches, occasional cough, some dyspnea with exertion, some right arm soreness from recent fracture repair. Past Medical History:  Diagnosis Date  . Cancer ([place])  APRIL OF 2000   LOW-GRADE NON-HODGKIN'S LYMPHOMA  . COPD (chronic obstructive pulmonary disease) ([place])   . Emphysema of lung ([place])   . Hyperlipidemia    Past Surgical History:  Procedure Laterality Date  . 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 ORIF HUMERUS FRACTURE Right 11/23/2017   Procedure: OPEN REDUCTION INTERNAL FIXATION (ORIF) HUMERAL SHAFT FRACTURE;  Surgeon: Shona Needles, MD;  Location: [facility];  Service: Orthopedics;  Laterality: Right;  . RIGHT OOPHORECTOMY  1976      Allergies: Contrast media [iodinated diagnostic agents]; Iohexol; Povidone-iodine; Penicillins; and Adhesive [tape]  Medications: Prior to Admission medications   Medication Sig Start Date End Date Taking? Authorizing Provider  allopurinol (ZYLOPRIM) 300 MG tablet Take 1 tablet (300 mg total) by mouth daily. 11/26/17   Marene Lenz, MD  allopurinol (ZYLOPRIM) 300 MG tablet Take 1 tablet (300 mg total) by mouth daily. 11/29/17   [name], MD  aspirin 81 MG tablet Take 81 mg by mouth daily.    [provider]  citalopram (CELEXA) 40 MG tablet Take 1 tablet (40 mg total) by mouth daily. 05/17/17    Nafziger, Tommi Rumps, NP  fluticasone furoate-vilanterol (BREO ELLIPTA) 100-25 MCG/INH AEPB Inhale 1 puff into the lungs every morning. 07/05/17   Tanda Rockers, MD  HYDROcodone-acetaminophen (NORCO/VICODIN) 5-325 MG tablet Take 1 tablet by mouth every 4 (four) hours as needed for moderate pain. 11/21/17   [name], MD  ibuprofen (ADVIL,MOTRIN) 200 MG tablet Take 200 mg by mouth every 6 (six) hours as needed for moderate pain.    [provider]  lidocaine-prilocaine (EMLA) cream Apply to affected area once 11/29/17   [name], MD  ondansetron (ZOFRAN) 8 MG tablet Take 1 tablet (8 mg total) by mouth every 8 (eight) hours as needed for nausea. 11/21/17   [name], MD  pantoprazole (PROTONIX) 40 MG tablet Take 1 tablet (40 mg total) by mouth daily. 10/12/17   Nafziger, Tommi Rumps, NP  predniSONE (DELTASONE) 20 MG tablet Take 2 tablets (40 mg total) by mouth daily. Start 40 mg daily on 2/23. After chemo, take 40 mg for 4 days 11/29/17   [name], MD  prochlorperazine (COMPAZINE) 10 MG tablet Take 1 tablet (10 mg total) by mouth every 6 (six) hours as needed (Nausea or vomiting). 11/29/17   [name], MD  senna (SENOKOT) 8.6 MG TABS tablet Take 2 tablets (17.2 mg total) by mouth daily as needed for mild constipation. 11/25/17   Marene Lenz, MD     Vital Signs: BP (!) 151/83 (BP Location: Left Arm)   Pulse 90   Temp 99.2 F (37.3 C) (Oral)   Resp 16   SpO2 92%   Physical Exam  awake, alert.  Chest with distant but clear breath sounds bilaterally.  Heart with normal rate, ectopy noted; abdomen soft, positive bowel sounds, nontender.  No lower extremity edema.  Right upper extremity with small amount of ecchymosis, incision sites from recent fracture repair clean and dry.  Imaging: No results found.  Labs:  CBC: Recent Labs    05/17/17 1208 10/12/17 1624 11/21/17 1321 11/25/17 0316  WBC 6.6 7.5 7.8 9.1  HGB 14.1 13.8 13.1 9.9*  HCT 41.9 41.5 38.7 28.6*  PLT 303.0 336.0 357  275    COAGS: Recent Labs    11/23/17 0902  INR 0.93    BMP: Recent Labs    05/24/17 0842 11/21/17 1321 11/24/17 0404 11/25/17 0316  NA 141 143 140 140  K 4.6 4.3 4.2 3.6  CL 101 101 103 105  CO2 31 32* 26 25  GLUCOSE 86 82 109* 105*  BUN 15 21 16 14   CALCIUM 10.2 10.9* 9.2 9.4  CREATININE 0.84 1.02 0.89 0.81  GFRNONAA  --  52* >60 >60  GFRAA  --  60* >60 >60    LIVER FUNCTION TESTS: Recent Labs    12/28/16 1002 05/24/17 0842 11/21/17 1321 11/24/17 0404  BILITOT 0.55 0.7 0.5 0.4  AST 20 22 59* 43*  ALT 12 12 42 24  ALKPHOS 111 97 161* 112  PROT 6.8 5.9* 6.6 5.8*  ALBUMIN 4.2 4.6 3.7 3.3*    Assessment and Plan: Pt with prior history of follicular lymphoma in 6160, status post chemotherapy ;now 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  She is  scheduled today for Port-A-Cath placement for chemotherapy.  Risks and benefits discussed with the patient including, but not limited to bleeding, infection, pneumothorax, or fibrin sheath development and need for additional procedures.  All of the patient's questions were answered, patient is agreeable to proceed. Consent signed and in chart.  Labs pending.   Electronically Signed: D. Rowe Robert, PA-C 12/07/2017, 12:33 PM   I spent a total of 20 minutes at the the patient's bedside AND on the patient's hospital floor or unit, greater than 50% of which was counseling/coordinating care for Port-A-Cath placement

## 2017-12-07 NOTE — Sedation Documentation (Signed)
Abx completed

## 2017-12-07 NOTE — Procedures (Signed)
Pre Procedure Dx: Lymphoma Post Procedural Dx: Same  Successful placement of right IJ approach port-a-cath with tip at the superior caval atrial junction. The catheter is ready for immediate use.  Estimated Blood Loss: Minimal  Complications: None immediate.  Jay Nox Talent, MD Pager #: [phone]   

## 2017-12-07 NOTE — Sedation Documentation (Signed)
Pt tx back to Short Stay Unit via stretcher, sr x 2 up with RN escort

## 2017-12-07 NOTE — Sedation Documentation (Signed)
Skin glue w/ Steri-strips applied to procedural sites by MD

## 2017-12-07 NOTE — Sedation Documentation (Signed)
Lab work, vs and hx all reviewed. MD/PA-C informed of pt's K level from today at 2.9. MD has noted and placed orders to provide PO K post procedure. Will proceed

## 2017-12-07 NOTE — Sedation Documentation (Signed)
Resting quietly with eyes closed, responds to light verbal stimuli, voices no complaints

## 2017-12-07 NOTE — Discharge Instructions (Signed)
Moderate Conscious Sedation, Adult, Care After °These instructions provide you with information about caring for yourself after your procedure. Your health care provider may also give you more specific instructions. Your treatment has been planned according to current medical practices, but problems sometimes occur. Call your health care provider if you have any problems or questions after your procedure. °What can I expect after the procedure? °After your procedure, it is common: °· To feel sleepy for several hours. °· To feel clumsy and have poor balance for several hours. °· To have poor judgment for several hours. °· To vomit if you eat too soon. ° °Follow these instructions at home: °For at least 24 hours after the procedure: ° °· Do not: °? Participate in activities where you could fall or become injured. °? Drive. °? Use heavy machinery. °? Drink alcohol. °? Take sleeping pills or medicines that cause drowsiness. °? Make important decisions or sign legal documents. °? Take care of children on your own. °· Rest. °Eating and drinking °· Follow the diet recommended by your health care provider. °· If you vomit: °? Drink water, juice, or soup when you can drink without vomiting. °? Make sure you have little or no nausea before eating solid foods. °General instructions °· Have a responsible adult stay with you until you are awake and alert. °· Take over-the-counter and prescription medicines only as told by your health care provider. °· If you smoke, do not smoke without supervision. °· Keep all follow-up visits as told by your health care provider. This is important. °Contact a health care provider if: °· You keep feeling nauseous or you keep vomiting. °· You feel light-headed. °· You develop a rash. °· You have a fever. °Get help right away if: °· You have trouble breathing. °This information is not intended to replace advice given to you by your health care provider. Make sure you discuss any questions you have  with your health care provider. °Document Released: 07/18/2013 Document Revised: 03/01/2016 Document Reviewed: 01/17/2016 °Elsevier Interactive Patient Education © 2018 [employer] °Implanted Port Insertion, Care After °This sheet gives you information about how to care for yourself after your procedure. Your health care provider may also give you more specific instructions. If you have problems or questions, contact your health care provider. °What can I expect after the procedure? °After your procedure, it is common to have: °· Discomfort at the port insertion site. °· Bruising on the skin over the port. This should improve over 3-4 days. ° °Follow these instructions at home: °Port care °· After your port is placed, you will get a manufacturer's information card. The card has information about your port. Keep this card with you at all times. °· Take care of the port as told by your health care provider. Ask your health care provider if you or a family member can get training for taking care of the port at home. A home health care nurse may also take care of the port. °· Make sure to remember what type of port you have. °Incision care °· Follow instructions from your health care provider about how to take care of your port insertion site. Make sure you: °? Wash your hands with soap and water before you change your bandage (dressing). If soap and water are not available, use hand sanitizer. °? Change your dressing as told by your health care provider. °? Leave stitches (sutures), skin glue, or adhesive strips in place. These skin closures may need to stay   in place for 2 weeks or longer. If adhesive strip edges start to loosen and curl up, you may trim the loose edges. Do not remove adhesive strips completely unless your health care provider tells you to do that. °· Check your port insertion site every day for signs of infection. Check for: °? More redness, swelling, or pain. °? More fluid or  blood. °? Warmth. °? Pus or a bad smell. °General instructions °· Do not take baths, swim, or use a hot tub until your health care provider approves. °· Do not lift anything that is heavier than 10 lb (4.5 kg) for a week, or as told by your health care provider. °· Ask your health care provider when it is okay to: °? Return to work or school. °? Resume usual physical activities or sports. °· Do not drive for 24 hours if you were given a medicine to help you relax (sedative). °· Take over-the-counter and prescription medicines only as told by your health care provider. °· Wear a medical alert bracelet in case of an emergency. This will tell any health care providers that you have a port. °· Keep all follow-up visits as told by your health care provider. This is important. °Contact a health care provider if: °· You cannot flush your port with saline as directed, or you cannot draw blood from the port. °· You have a fever or chills. °· You have more redness, swelling, or pain around your port insertion site. °· You have more fluid or blood coming from your port insertion site. °· Your port insertion site feels warm to the touch. °· You have pus or a bad smell coming from the port insertion site. °Get help right away if: °· You have chest pain or shortness of breath. °· You have bleeding from your port that you cannot control. °Summary °· Take care of the port as told by your health care provider. °· Change your dressing as told by your health care provider. °· Keep all follow-up visits as told by your health care provider. °This information is not intended to replace advice given to you by your health care provider. Make sure you discuss any questions you have with your health care provider. °Document Released: 07/18/2013 Document Revised: 08/18/2016 Document Reviewed: 08/18/2016 °Elsevier Interactive Patient Education © 2017 Elsevier Inc. ° °

## 2017-12-07 NOTE — Sedation Documentation (Signed)
Pt cont to be resting quietly with eyes closed, appears comfortable, voices no complaints

## 2017-12-07 NOTE — Sedation Documentation (Signed)
Patient is resting comfortably. Pt states " I am very relaxed and comfortable"

## 2017-12-07 NOTE — Sedation Documentation (Signed)
Pt appears now to be resting more quietly, relaxed in appearance

## 2017-12-07 NOTE — Sedation Documentation (Signed)
MD in room

## 2017-12-08 ENCOUNTER — Other Ambulatory Visit: Payer: Medicare Other

## 2017-12-08 ENCOUNTER — Other Ambulatory Visit: Payer: Self-pay | Admitting: Hematology and Oncology

## 2017-12-08 NOTE — Telephone Encounter (Signed)
Brooke [place], please advise her on potassium rich diet

## 2017-12-08 NOTE — Telephone Encounter (Signed)
Called regarding below message. Instructed to call for questions. Given a list of high potassium foods to eat.

## 2017-12-09 ENCOUNTER — Inpatient Hospital Stay: Payer: Medicare Other | Attending: Hematology and Oncology

## 2017-12-09 VITALS — BP 111/69 | HR 94 | Temp 97.7°F | Resp 18

## 2017-12-09 DIAGNOSIS — Z5112 Encounter for antineoplastic immunotherapy: Secondary | ICD-10-CM | POA: Diagnosis not present

## 2017-12-09 DIAGNOSIS — Z5189 Encounter for other specified aftercare: Secondary | ICD-10-CM | POA: Diagnosis not present

## 2017-12-09 DIAGNOSIS — R110 Nausea: Secondary | ICD-10-CM | POA: Insufficient documentation

## 2017-12-09 DIAGNOSIS — J449 Chronic obstructive pulmonary disease, unspecified: Secondary | ICD-10-CM | POA: Insufficient documentation

## 2017-12-09 DIAGNOSIS — C8338 Diffuse large B-cell lymphoma, lymph nodes of multiple sites: Secondary | ICD-10-CM | POA: Diagnosis not present

## 2017-12-09 DIAGNOSIS — K1231 Oral mucositis (ulcerative) due to antineoplastic therapy: Secondary | ICD-10-CM | POA: Diagnosis not present

## 2017-12-09 DIAGNOSIS — K5900 Constipation, unspecified: Secondary | ICD-10-CM | POA: Diagnosis not present

## 2017-12-09 DIAGNOSIS — Z5111 Encounter for antineoplastic chemotherapy: Secondary | ICD-10-CM | POA: Diagnosis not present

## 2017-12-09 DIAGNOSIS — G893 Neoplasm related pain (acute) (chronic): Secondary | ICD-10-CM | POA: Diagnosis not present

## 2017-12-09 DIAGNOSIS — R51 Headache: Secondary | ICD-10-CM | POA: Diagnosis not present

## 2017-12-09 DIAGNOSIS — R05 Cough: Secondary | ICD-10-CM | POA: Diagnosis not present

## 2017-12-09 MED ORDER — DIPHENHYDRAMINE HCL 25 MG PO CAPS
50.0000 mg | ORAL_CAPSULE | Freq: Once | ORAL | Status: AC
  Administered 2017-12-09: 50 mg via ORAL

## 2017-12-09 MED ORDER — SODIUM CHLORIDE 0.9 % IV SOLN
Freq: Once | INTRAVENOUS | Status: AC
  Administered 2017-12-09: 09:00:00 via INTRAVENOUS
  Filled 2017-12-09: qty 5

## 2017-12-09 MED ORDER — HEPARIN SOD (PORK) LOCK FLUSH 100 UNIT/ML IV SOLN
500.0000 [IU] | Freq: Once | INTRAVENOUS | Status: AC | PRN
  Administered 2017-12-09: 500 [IU]
  Filled 2017-12-09: qty 5

## 2017-12-09 MED ORDER — ACETAMINOPHEN 325 MG PO TABS
ORAL_TABLET | ORAL | Status: AC
  Filled 2017-12-09: qty 2

## 2017-12-09 MED ORDER — DIPHENHYDRAMINE HCL 25 MG PO CAPS
ORAL_CAPSULE | ORAL | Status: AC
  Filled 2017-12-09: qty 2

## 2017-12-09 MED ORDER — SODIUM CHLORIDE 0.9 % IV SOLN
Freq: Once | INTRAVENOUS | Status: AC
  Administered 2017-12-09: 09:00:00 via INTRAVENOUS

## 2017-12-09 MED ORDER — PALONOSETRON HCL INJECTION 0.25 MG/5ML
0.2500 mg | Freq: Once | INTRAVENOUS | Status: AC
  Administered 2017-12-09: 0.25 mg via INTRAVENOUS

## 2017-12-09 MED ORDER — CYCLOPHOSPHAMIDE CHEMO INJECTION 1 GM
750.0000 mg/m2 | Freq: Once | INTRAMUSCULAR | Status: AC
  Administered 2017-12-09: 1260 mg via INTRAVENOUS
  Filled 2017-12-09: qty 63

## 2017-12-09 MED ORDER — ACETAMINOPHEN 325 MG PO TABS
650.0000 mg | ORAL_TABLET | Freq: Once | ORAL | Status: AC
  Administered 2017-12-09: 650 mg via ORAL

## 2017-12-09 MED ORDER — VINCRISTINE SULFATE CHEMO INJECTION 1 MG/ML
2.0000 mg | Freq: Once | INTRAVENOUS | Status: AC
  Administered 2017-12-09: 2 mg via INTRAVENOUS
  Filled 2017-12-09: qty 2

## 2017-12-09 MED ORDER — DOXORUBICIN HCL CHEMO IV INJECTION 2 MG/ML
50.0000 mg/m2 | Freq: Once | INTRAVENOUS | Status: AC
  Administered 2017-12-09: 84 mg via INTRAVENOUS
  Filled 2017-12-09: qty 42

## 2017-12-09 MED ORDER — PALONOSETRON HCL INJECTION 0.25 MG/5ML
INTRAVENOUS | Status: AC
  Filled 2017-12-09: qty 5

## 2017-12-09 MED ORDER — SODIUM CHLORIDE 0.9% FLUSH
10.0000 mL | INTRAVENOUS | Status: DC | PRN
  Administered 2017-12-09: 10 mL
  Filled 2017-12-09: qty 10

## 2017-12-09 MED ORDER — SODIUM CHLORIDE 0.9 % IV SOLN
600.0000 mg | Freq: Once | INTRAVENOUS | Status: AC
  Administered 2017-12-09: 600 mg via INTRAVENOUS
  Filled 2017-12-09: qty 50

## 2017-12-09 NOTE — Patient Instructions (Signed)
[facility] Discharge Instructions for Patients Receiving Chemotherapy  Today you received the following chemotherapy agents Adriamycin, Vincristine, Cytoxan, and Rituxan  To help prevent nausea and vomiting after your treatment, we encourage you to take your nausea medication as directed   If you develop nausea and vomiting that is not controlled by your nausea medication, call the clinic.   BELOW ARE SYMPTOMS THAT SHOULD BE REPORTED IMMEDIATELY:  *FEVER GREATER THAN 100.5 F  *CHILLS WITH OR WITHOUT FEVER  NAUSEA AND VOMITING THAT IS NOT CONTROLLED WITH YOUR NAUSEA MEDICATION  *UNUSUAL SHORTNESS OF BREATH  *UNUSUAL BRUISING OR BLEEDING  TENDERNESS IN MOUTH AND THROAT WITH OR WITHOUT PRESENCE OF ULCERS  *URINARY PROBLEMS  *BOWEL PROBLEMS  UNUSUAL RASH Items with * indicate a potential emergency and should be followed up as soon as possible.  Feel free to call the clinic should you have any questions or concerns. The clinic phone number is [phone].  Please show the CHEMO ALERT CARD at check-in to the Emergency Department and triage nurse.   Doxorubicin (Adriamycin) injection What is this medicine? DOXORUBICIN (dox oh ROO bi sin) is a chemotherapy drug. It is used to treat many kinds of cancer like leukemia, lymphoma, neuroblastoma, sarcoma, and Wilms' tumor. It is also used to treat bladder cancer, breast cancer, lung cancer, ovarian cancer, stomach cancer, and thyroid cancer. This medicine may be used for other purposes; ask your health care provider or pharmacist if you have questions. COMMON BRAND NAME(S): Adriamycin, Adriamycin PFS, Adriamycin RDF, Rubex What should I tell my health care provider before I take this medicine? They need to know if you have any of these conditions: -heart disease -history of low blood counts caused by a medicine -liver disease -recent or ongoing radiation therapy -an unusual or allergic reaction to doxorubicin, other  chemotherapy agents, other medicines, foods, dyes, or preservatives -pregnant or trying to get pregnant -breast-feeding How should I use this medicine? This drug is given as an infusion into a vein. It is administered in a hospital or clinic by a specially trained health care professional. If you have pain, swelling, burning or any unusual feeling around the site of your injection, tell your health care professional right away. Talk to your pediatrician regarding the use of this medicine in children. Special care may be needed. Overdosage: If you think you have taken too much of this medicine contact a poison control center or emergency room at once. NOTE: This medicine is only for you. Do not share this medicine with others. What if I miss a dose? It is important not to miss your dose. Call your doctor or health care professional if you are unable to keep an appointment. What may interact with this medicine? This medicine may interact with the following medications: -6-mercaptopurine -paclitaxel -phenytoin -St. John's Wort -trastuzumab -verapamil This list may not describe all possible interactions. Give your health care provider a list of all the medicines, herbs, non-prescription drugs, or dietary supplements you use. Also tell them if you smoke, drink alcohol, or use illegal drugs. Some items may interact with your medicine. What should I watch for while using this medicine? This drug may make you feel generally unwell. This is not uncommon, as chemotherapy can affect healthy cells as well as cancer cells. Report any side effects. Continue your course of treatment even though you feel ill unless your doctor tells you to stop. There is a maximum amount of this medicine you should receive throughout your life.   The amount depends on the medical condition being treated and your overall health. Your doctor will watch how much of this medicine you receive in your lifetime. Tell your doctor if you  have taken this medicine before. You may need blood work done while you are taking this medicine. Your urine may turn red for a few days after your dose. This is not blood. If your urine is dark or brown, call your doctor. In some cases, you may be given additional medicines to help with side effects. Follow all directions for their use. Call your doctor or health care professional for advice if you get a fever, chills or sore throat, or other symptoms of a cold or flu. Do not treat yourself. This drug decreases your body's ability to fight infections. Try to avoid being around people who are sick. This medicine may increase your risk to bruise or bleed. Call your doctor or health care professional if you notice any unusual bleeding. Talk to your doctor about your risk of cancer. You may be more at risk for certain types of cancers if you take this medicine. Do not become pregnant while taking this medicine or for 6 months after stopping it. Women should inform their doctor if they wish to become pregnant or think they might be pregnant. Men should not father a child while taking this medicine and for 6 months after stopping it. There is a potential for serious side effects to an unborn child. Talk to your health care professional or pharmacist for more information. Do not breast-feed an infant while taking this medicine. This medicine has caused ovarian failure in some women and reduced sperm counts in some men This medicine may interfere with the ability to have a child. Talk with your doctor or health care professional if you are concerned about your fertility. What side effects may I notice from receiving this medicine? Side effects that you should report to your doctor or health care professional as soon as possible: -allergic reactions like skin rash, itching or hives, swelling of the face, lips, or tongue -breathing problems -chest pain -fast or irregular heartbeat -low blood counts - this  medicine may decrease the number of white blood cells, red blood cells and platelets. You may be at increased risk for infections and bleeding. -pain, redness, or irritation at site where injected -signs of infection - fever or chills, cough, sore throat, pain or difficulty passing urine -signs of decreased platelets or bleeding - bruising, pinpoint red spots on the skin, black, tarry stools, blood in the urine -swelling of the ankles, feet, hands -tiredness -weakness Side effects that usually do not require medical attention (report to your doctor or health care professional if they continue or are bothersome): -diarrhea -hair loss -mouth sores -nail discoloration or damage -nausea -red colored urine -vomiting This list may not describe all possible side effects. Call your doctor for medical advice about side effects. You may report side effects to FDA at 1-800-FDA-1088. Where should I keep my medicine? This drug is given in a hospital or clinic and will not be stored at home. NOTE: This sheet is a summary. It may not cover all possible information. If you have questions about this medicine, talk to your doctor, pharmacist, or health care provider.  2018 Elsevier/Gold Standard (2015-11-24 11:28:51)   Vincristine injection What is this medicine? VINCRISTINE (vin KRIS teen) is a chemotherapy drug. It slows the growth of cancer cells. This medicine is used to treat many types of   cancer like Hodgkin's disease, leukemia, non-Hodgkin's lymphoma, neuroblastoma (brain cancer), rhabdomyosarcoma, and Wilms' tumor. This medicine may be used for other purposes; ask your health care provider or pharmacist if you have questions. COMMON BRAND NAME(S): Oncovin, Vincasar PFS What should I tell my health care provider before I take this medicine? They need to know if you have any of these conditions: -blood disorders -gout -infection (especially chickenpox, cold sores, or herpes) -kidney  disease -liver disease -lung disease -nervous system disease like Charcot-Marie-Tooth (CMT) -recent or ongoing radiation therapy -an unusual or allergic reaction to vincristine, other chemotherapy agents, other medicines, foods, dyes, or preservatives -pregnant or trying to get pregnant -breast-feeding How should I use this medicine? This drug is given as an infusion into a vein. It is administered in a hospital or clinic by a specially trained health care professional. If you have pain, swelling, burning, or any unusual feeling around the site of your injection, tell your health care professional right away. Talk to your pediatrician regarding the use of this medicine in children. While this drug may be prescribed for selected conditions, precautions do apply. Overdosage: If you think you have taken too much of this medicine contact a poison control center or emergency room at once. NOTE: This medicine is only for you. Do not share this medicine with others. What if I miss a dose? It is important not to miss your dose. Call your doctor or health care professional if you are unable to keep an appointment. What may interact with this medicine? Do not take this medicine with any of the following medications: -itraconazole -mibefradil -voriconazole This medicine may also interact with the following medications: -cyclosporine -erythromycin -fluconazole -ketoconazole -medicines for HIV like delavirdine, efavirenz, nevirapine -medicines for seizures like ethotoin, fosphenotoin, phenytoin -medicines to increase blood counts like filgrastim, pegfilgrastim, sargramostim -other chemotherapy drugs like cisplatin, L-asparaginase, methotrexate, mitomycin, paclitaxel -pegaspargase -vaccines -zalcitabine, ddC Talk to your doctor or health care professional before taking any of these medicines: -acetaminophen -aspirin -ibuprofen -ketoprofen -naproxen This list may not describe all possible  interactions. Give your health care provider a list of all the medicines, herbs, non-prescription drugs, or dietary supplements you use. Also tell them if you smoke, drink alcohol, or use illegal drugs. Some items may interact with your medicine. What should I watch for while using this medicine? Your condition will be monitored carefully while you are receiving this medicine. You will need important blood work done while you are taking this medicine. This drug may make you feel generally unwell. This is not uncommon, as chemotherapy can affect healthy cells as well as cancer cells. Report any side effects. Continue your course of treatment even though you feel ill unless your doctor tells you to stop. In some cases, you may be given additional medicines to help with side effects. Follow all directions for their use. Call your doctor or health care professional for advice if you get a fever, chills or sore throat, or other symptoms of a cold or flu. Do not treat yourself. Avoid taking products that contain aspirin, acetaminophen, ibuprofen, naproxen, or ketoprofen unless instructed by your doctor. These medicines may hide a fever. Do not become pregnant while taking this medicine. Women should inform their doctor if they wish to become pregnant or think they might be pregnant. There is a potential for serious side effects to an unborn child. Talk to your health care professional or pharmacist for more information. Do not breast-feed an infant while taking this medicine. Men   may have a lower sperm count while taking this medicine. Talk to your doctor if you plan to father a child. What side effects may I notice from receiving this medicine? Side effects that you should report to your doctor or health care professional as soon as possible: -allergic reactions like skin rash, itching or hives, swelling of the face, lips, or tongue -breathing problems -confusion or changes in emotions or  moods -constipation -cough -mouth sores -muscle weakness -nausea and vomiting -pain, swelling, redness or irritation at the injection site -pain, tingling, numbness in the hands or feet -problems with balance, talking, walking -seizures -stomach pain -trouble passing urine or change in the amount of urine Side effects that usually do not require medical attention (report to your doctor or health care professional if they continue or are bothersome): -diarrhea -hair loss -jaw pain -loss of appetite This list may not describe all possible side effects. Call your doctor for medical advice about side effects. You may report side effects to FDA at 1-800-FDA-1088. Where should I keep my medicine? This drug is given in a hospital or clinic and will not be stored at home. NOTE: This sheet is a summary. It may not cover all possible information. If you have questions about this medicine, talk to your doctor, pharmacist, or health care provider.  2018 Elsevier/Gold Standard (2008-06-24 17:17:13)     Cyclophosphamide (Cytoxan) injection What is this medicine? CYCLOPHOSPHAMIDE (sye kloe FOSS fa mide) is a chemotherapy drug. It slows the growth of cancer cells. This medicine is used to treat many types of cancer like lymphoma, myeloma, leukemia, breast cancer, and ovarian cancer, to name a few. This medicine may be used for other purposes; ask your health care provider or pharmacist if you have questions. COMMON BRAND NAME(S): Cytoxan, Neosar What should I tell my health care provider before I take this medicine? They need to know if you have any of these conditions: -blood disorders -history of other chemotherapy -infection -kidney disease -liver disease -recent or ongoing radiation therapy -tumors in the bone marrow -an unusual or allergic reaction to cyclophosphamide, other chemotherapy, other medicines, foods, dyes, or preservatives -pregnant or trying to get  pregnant -breast-feeding How should I use this medicine? This drug is usually given as an injection into a vein or muscle or by infusion into a vein. It is administered in a hospital or clinic by a specially trained health care professional. Talk to your pediatrician regarding the use of this medicine in children. Special care may be needed. Overdosage: If you think you have taken too much of this medicine contact a poison control center or emergency room at once. NOTE: This medicine is only for you. Do not share this medicine with others. What if I miss a dose? It is important not to miss your dose. Call your doctor or health care professional if you are unable to keep an appointment. What may interact with this medicine? This medicine may interact with the following medications: -amiodarone -amphotericin B -azathioprine -certain antiviral medicines for HIV or AIDS such as protease inhibitors (e.g., indinavir, ritonavir) and zidovudine -certain blood pressure medications such as benazepril, captopril, enalapril, fosinopril, lisinopril, moexipril, monopril, perindopril, quinapril, ramipril, trandolapril -certain cancer medications such as anthracyclines (e.g., daunorubicin, doxorubicin), busulfan, cytarabine, paclitaxel, pentostatin, tamoxifen, trastuzumab -certain diuretics such as chlorothiazide, chlorthalidone, hydrochlorothiazide, indapamide, metolazone -certain medicines that treat or prevent blood clots like warfarin -certain muscle relaxants such as succinylcholine -cyclosporine -etanercept -indomethacin -medicines to increase blood counts like filgrastim, pegfilgrastim, sargramostim -  medicines used as general anesthesia -metronidazole -natalizumab This list may not describe all possible interactions. Give your health care provider a list of all the medicines, herbs, non-prescription drugs, or dietary supplements you use. Also tell them if you smoke, drink alcohol, or use illegal  drugs. Some items may interact with your medicine. What should I watch for while using this medicine? Visit your doctor for checks on your progress. This drug may make you feel generally unwell. This is not uncommon, as chemotherapy can affect healthy cells as well as cancer cells. Report any side effects. Continue your course of treatment even though you feel ill unless your doctor tells you to stop. Drink water or other fluids as directed. Urinate often, even at night. In some cases, you may be given additional medicines to help with side effects. Follow all directions for their use. Call your doctor or health care professional for advice if you get a fever, chills or sore throat, or other symptoms of a cold or flu. Do not treat yourself. This drug decreases your body's ability to fight infections. Try to avoid being around people who are sick. This medicine may increase your risk to bruise or bleed. Call your doctor or health care professional if you notice any unusual bleeding. Be careful brushing and flossing your teeth or using a toothpick because you may get an infection or bleed more easily. If you have any dental work done, tell your dentist you are receiving this medicine. You may get drowsy or dizzy. Do not drive, use machinery, or do anything that needs mental alertness until you know how this medicine affects you. Do not become pregnant while taking this medicine or for 1 year after stopping it. Women should inform their doctor if they wish to become pregnant or think they might be pregnant. Men should not father a child while taking this medicine and for 4 months after stopping it. There is a potential for serious side effects to an unborn child. Talk to your health care professional or pharmacist for more information. Do not breast-feed an infant while taking this medicine. This medicine may interfere with the ability to have a child. This medicine has caused ovarian failure in some women.  This medicine has caused reduced sperm counts in some men. You should talk with your doctor or health care professional if you are concerned about your fertility. If you are going to have surgery, tell your doctor or health care professional that you have taken this medicine. What side effects may I notice from receiving this medicine? Side effects that you should report to your doctor or health care professional as soon as possible: -allergic reactions like skin rash, itching or hives, swelling of the face, lips, or tongue -low blood counts - this medicine may decrease the number of white blood cells, red blood cells and platelets. You may be at increased risk for infections and bleeding. -signs of infection - fever or chills, cough, sore throat, pain or difficulty passing urine -signs of decreased platelets or bleeding - bruising, pinpoint red spots on the skin, black, tarry stools, blood in the urine -signs of decreased red blood cells - unusually weak or tired, fainting spells, lightheadedness -breathing problems -dark urine -dizziness -palpitations -swelling of the ankles, feet, hands -trouble passing urine or change in the amount of urine -weight gain -yellowing of the eyes or skin Side effects that usually do not require medical attention (report to your doctor or health care professional if they continue   or are bothersome): -changes in nail or skin color -hair loss -missed menstrual periods -mouth sores -nausea, vomiting This list may not describe all possible side effects. Call your doctor for medical advice about side effects. You may report side effects to FDA at 1-800-FDA-1088. Where should I keep my medicine? This drug is given in a hospital or clinic and will not be stored at home. NOTE: This sheet is a summary. It may not cover all possible information. If you have questions about this medicine, talk to your doctor, pharmacist, or health care provider.  2018 Elsevier/Gold  Standard (2012-08-11 16:22:58)   Rituximab (Rituxan) injection What is this medicine? RITUXIMAB (ri TUX i mab) is a monoclonal antibody. It is used to treat certain types of cancer like non-Hodgkin lymphoma and chronic lymphocytic leukemia. It is also used to treat rheumatoid arthritis, granulomatosis with polyangiitis (or Wegener's granulomatosis), and microscopic polyangiitis. This medicine may be used for other purposes; ask your health care provider or pharmacist if you have questions. COMMON BRAND NAME(S): Rituxan What should I tell my health care provider before I take this medicine? They need to know if you have any of these conditions: -heart disease -infection (especially a virus infection such as hepatitis B, chickenpox, cold sores, or herpes) -immune system problems -irregular heartbeat -kidney disease -lung or breathing disease, like asthma -recently received or scheduled to receive a vaccine -an unusual or allergic reaction to rituximab, mouse proteins, other medicines, foods, dyes, or preservatives -pregnant or trying to get pregnant -breast-feeding How should I use this medicine? This medicine is for infusion into a vein. It is administered in a hospital or clinic by a specially trained health care professional. A special MedGuide will be given to you by the pharmacist with each prescription and refill. Be sure to read this information carefully each time. Talk to your pediatrician regarding the use of this medicine in children. This medicine is not approved for use in children. Overdosage: If you think you have taken too much of this medicine contact a poison control center or emergency room at once. NOTE: This medicine is only for you. Do not share this medicine with others. What if I miss a dose? It is important not to miss a dose. Call your doctor or health care professional if you are unable to keep an appointment. What may interact with this  medicine? -cisplatin -other medicines for arthritis like disease modifying antirheumatic drugs or tumor necrosis factor inhibitors -live virus vaccines This list may not describe all possible interactions. Give your health care provider a list of all the medicines, herbs, non-prescription drugs, or dietary supplements you use. Also tell them if you smoke, drink alcohol, or use illegal drugs. Some items may interact with your medicine. What should I watch for while using this medicine? Your condition will be monitored carefully while you are receiving this medicine. You may need blood work done while you are taking this medicine. This medicine can cause serious allergic reactions. To reduce your risk you may need to take medicine before treatment with this medicine. Take your medicine as directed. In some patients, this medicine may cause a serious brain infection that may cause death. If you have any problems seeing, thinking, speaking, walking, or standing, tell your doctor right away. If you cannot reach your doctor, urgently seek other source of medical care. Call your doctor or health care professional for advice if you get a fever, chills or sore throat, or other symptoms of a cold or   flu. Do not treat yourself. This drug decreases your body's ability to fight infections. Try to avoid being around people who are sick. Do not become pregnant while taking this medicine or for 12 months after stopping it. Women should inform their doctor if they wish to become pregnant or think they might be pregnant. There is a potential for serious side effects to an unborn child. Talk to your health care professional or pharmacist for more information. What side effects may I notice from receiving this medicine? Side effects that you should report to your doctor or health care professional as soon as possible: -breathing problems -chest pain -dizziness or feeling faint -fast, irregular heartbeat -low blood  counts - this medicine may decrease the number of white blood cells, red blood cells and platelets. You may be at increased risk for infections and bleeding. -mouth sores -redness, blistering, peeling or loosening of the skin, including inside the mouth (this can be added for any serious or exfoliative rash that could lead to hospitalization) -signs of infection - fever or chills, cough, sore throat, pain or difficulty passing urine -signs and symptoms of kidney injury like trouble passing urine or change in the amount of urine -signs and symptoms of liver injury like dark yellow or brown urine; general ill feeling or flu-like symptoms; light-colored stools; loss of appetite; nausea; right upper belly pain; unusually weak or tired; yellowing of the eyes or skin -stomach pain -vomiting Side effects that usually do not require medical attention (report to your doctor or health care professional if they continue or are bothersome): -headache -joint pain -muscle cramps or muscle pain This list may not describe all possible side effects. Call your doctor for medical advice about side effects. You may report side effects to FDA at 1-800-FDA-1088. Where should I keep my medicine? This drug is given in a hospital or clinic and will not be stored at home. NOTE: This sheet is a summary. It may not cover all possible information. If you have questions about this medicine, talk to your doctor, pharmacist, or health care provider.  2018 [employer] (2016-05-05 15:28:09)     

## 2017-12-12 ENCOUNTER — Encounter: Payer: Self-pay | Admitting: Hematology and Oncology

## 2017-12-12 ENCOUNTER — Other Ambulatory Visit: Payer: Self-pay | Admitting: Hematology and Oncology

## 2017-12-12 ENCOUNTER — Inpatient Hospital Stay (HOSPITAL_BASED_OUTPATIENT_CLINIC_OR_DEPARTMENT_OTHER): Payer: Medicare Other | Admitting: Hematology and Oncology

## 2017-12-12 ENCOUNTER — Telehealth: Payer: Self-pay

## 2017-12-12 ENCOUNTER — Inpatient Hospital Stay: Payer: Medicare Other

## 2017-12-12 DIAGNOSIS — K1231 Oral mucositis (ulcerative) due to antineoplastic therapy: Secondary | ICD-10-CM | POA: Diagnosis not present

## 2017-12-12 DIAGNOSIS — C8338 Diffuse large B-cell lymphoma, lymph nodes of multiple sites: Secondary | ICD-10-CM

## 2017-12-12 DIAGNOSIS — G893 Neoplasm related pain (acute) (chronic): Secondary | ICD-10-CM

## 2017-12-12 DIAGNOSIS — Z5112 Encounter for antineoplastic immunotherapy: Secondary | ICD-10-CM | POA: Diagnosis not present

## 2017-12-12 DIAGNOSIS — M8450XA Pathological fracture in neoplastic disease, unspecified site, initial encounter for fracture: Secondary | ICD-10-CM

## 2017-12-12 DIAGNOSIS — R110 Nausea: Secondary | ICD-10-CM

## 2017-12-12 DIAGNOSIS — C8588 Other specified types of non-Hodgkin lymphoma, lymph nodes of multiple sites: Secondary | ICD-10-CM

## 2017-12-12 DIAGNOSIS — T451X5A Adverse effect of antineoplastic and immunosuppressive drugs, initial encounter: Secondary | ICD-10-CM

## 2017-12-12 DIAGNOSIS — Z5111 Encounter for antineoplastic chemotherapy: Secondary | ICD-10-CM | POA: Diagnosis not present

## 2017-12-12 MED ORDER — PEGFILGRASTIM-CBQV 6 MG/0.6ML ~~LOC~~ SOSY
PREFILLED_SYRINGE | SUBCUTANEOUS | Status: AC
  Filled 2017-12-12: qty 0.6

## 2017-12-12 MED ORDER — PEGFILGRASTIM-CBQV 6 MG/0.6ML ~~LOC~~ SOSY
6.0000 mg | PREFILLED_SYRINGE | Freq: Once | SUBCUTANEOUS | Status: AC
  Administered 2017-12-12: 6 mg via SUBCUTANEOUS

## 2017-12-12 NOTE — Assessment & Plan Note (Signed)
She has appointment pending to follow-up with orthopedic surgeon after recent surgery Her wound appears to be healing well

## 2017-12-12 NOTE — Assessment & Plan Note (Signed)
She is doing very well with resolution of pain She has no side effects from treatment so far except for mild nausea, well controlled with current antiemetics Continue supportive care.  She would receive G-CSF support today I will see her back prior to cycle 2 of treatment, 3 weeks from now Second cycle onwards, she would receive prophylactic IT chemotherapy

## 2017-12-12 NOTE — Progress Notes (Signed)
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  She would receive G-CSF support today I will see her back prior to cycle 2 of treatment, 3 weeks from now Second cycle onwards, she would receive prophylactic IT chemotherapy  Pathological fracture due to neoplastic disease, initial encounter She has appointment pending to follow-up with orthopedic surgeon after recent surgery Her wound appears to be healing well  Cancer associated pain She has no pain since chemotherapy Will observe closely  Chemotherapy-induced nausea The nausea could be related to a component of gastric reflux I recommend antiacids as needed The last dose of her prednisone should be tomorrow She would continue to take antiemetics as needed   No orders of the defined types were placed in this encounter.   INTERVAL HISTORY: Please see below for problem oriented charting. 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  Presented w bulky adenopathy, splenomegaly and bone marrow  involvement c/w B-cell nonHodgkins lymphoma, low grade mixed follicular and diffuse. She was treated with chlorambucil in 2000.   --2007 - 09/2008.  Treated with single agent Rituxan.   --12/2008.  She had progressive adenopathy in chest and abdomen and repeat biopsy showed low grade follicular and diffuse B cell NHL.   --01/2009 - 07/2009. She required left ureteral stent and was treated with Treanda/Rituxan x 5 cycles from 02-2009 thru 06-2009.       11/20/2008 Imaging    Mildly displaced and angulated pathologic fracture of the proximal humeral shaft. 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       09/16/2014 Imaging    No lymphadenopathy in the chest, abdomen, or pelvis. The small right axillary lymph nodes seen on the previous exam are unchanged in the 18 month interval. Small to moderate hiatal hernia.      11/22/2017 PET scan    New hypermetabolic lymphadenopathy within the chest and abdomen, consistent with recurrent lymphoma. New large hypermetabolic mass in the right hepatic lobe, consistent with lymphoma this involvement. New small hypermetabolic mass in the left kidney, which may be due to lymphomatous involvement although primary renal cell carcinoma cannot be excluded. Hypermetabolic mass in the right humerus with pathologic fracture, consistent with lymphomatous involvement. Small hypermetabolic foci in right posterior chest wall, also suspicious for recurrent lymphoma.      11/23/2017 Procedure    Bone, fragment(s), Right Humeral Shaft Reamings - ATYPICAL LYMPHOID INFILTRATE CONSISTENT WITH NON-HODGKIN'S B CELL LYMPHOMA. - SEE ONCOLOGY TABLE. Microscopic Comment LYMPHOMA Histologic type: Non-Hodgkins lymphoma, favor large cell type. Grade (if applicable):  Favor high grade. Flow cytometry: No specimen is available for analysis. Immunohistochemical stains: BCL-2, BCL-6, CD3, CD5, CD10, CD15, CD20, CD30, CD34, CD43, LCA, CD79a, CD138,  Ki-67, PAX-5 and TdT with appropriate controls as performed on block 1C. Touch preps/imprints: Not performed. Comments: The sections show multiple, primarily soft tissue fragments displaying a dense lymphoid infiltrate characterized by a mixture of small round to slightly irregular lymphocytes, histiocytes, and large centroblastic appearing lymphoid cells with partially clumped to vesicular chromatin, small nucleoli and amphophilic to clear cytoplasm. The number of admixed large lymphoid cells is variable but they appear relatively abundant in some areas associated with clustering. No material is available for flow cytometric analysis. Hence, a large batter of immunohistochemical stains were performed and show a mixture of T and B cells but with a significant B cell component mostly composed of large lymphoid cells with positivity for CD79a, PAX-5, CD30, BCL-2, and BCL-6. LCA shows diffuse staining although some of the large lymphoid cells appear negative. No significant staining is seen with CD20, CD10, CD15, CD34, TdT, or CD138. The lack of CD20 expression in this material is possibly related to prior treatment. There is variable increased expression of Ki-67 ranging from 10 to >50% in some areas particularly where there is abundance of large lymphoid cells. There is admixed abundant T cell population in the background as seen with CD3, CD5, and CD43 with on apparent co-expression of CD5 in B cell areas. The overall findings are consistent with involvement by non-Hodgkin's B cell lymphoma and the presence of relative abundance of large lymphoid B-cells and increased Ki-67 expression favor a high grade large B cell lymphoma.      11/23/2017 Surgery    1. CPT 24516-Intramedullary nailing of right humerus fracture 2. CPT 20245-Bone biopsy, deep        11/23/2017 - 11/25/2017 Hospital Admission    She was admitted to the hospital for management of humerus fracture and aggressive lymphoma      11/24/2017  Imaging    ECHO showed LV EF: 65% -   70%      11/25/2017 Procedure    Status post ultrasound-guided biopsy of liver mass. Tissue specimen sent to pathology for complete histopathologic analysis      12/07/2017 Imaging    Successful placement of a right internal jugular approach power injectable Port-A-Cath. The catheter is ready for immediate use.       REVIEW OF SYSTEMS:   Constitutional: Denies fevers, chills or abnormal weight loss Eyes: Denies blurriness of vision Ears, nose, mouth, throat, and face: Denies mucositis or sore throat Cardiovascular: Denies palpitation, chest discomfort or lower extremity swelling Skin: Denies abnormal skin rashes Lymphatics: Denies new lymphadenopathy or easy bruising Neurological:Denies numbness, tingling or new weaknesses Behavioral/Psych: Mood is stable, no new changes  All other systems were reviewed with the patient and are negative.  I have reviewed the past medical history, past surgical history, social history and family history with the patient and they are unchanged from previous note.  ALLERGIES:  is allergic to contrast media [iodinated diagnostic agents]; iohexol; povidone-iodine; penicillins; and adhesive [tape].  MEDICATIONS:  Current Outpatient Medications  Medication Sig Dispense Refill  . allopurinol (ZYLOPRIM) 300 MG tablet Take 1 tablet (300 mg total) by mouth daily. 30 tablet 0  . allopurinol (ZYLOPRIM) 300 MG tablet Take 1 tablet (300 mg total) by mouth daily. 30 tablet 3  . aspirin 81 MG tablet Take 81 mg by mouth daily.    . citalopram (CELEXA) 40 MG  tablet Take 1 tablet (40 mg total) by mouth daily. 90 tablet 1  . fluticasone furoate-vilanterol (BREO ELLIPTA) 100-25 MCG/INH AEPB Inhale 1 puff into the lungs every morning.    Marland Kitchen HYDROcodone-acetaminophen (NORCO/VICODIN) 5-325 MG tablet Take 1 tablet by mouth every 4 (four) hours as needed for moderate pain. 60 tablet 0  . ibuprofen (ADVIL,MOTRIN) 200 MG tablet Take 200 mg  by mouth every 6 (six) hours as needed for moderate pain.    Marland Kitchen lidocaine-prilocaine (EMLA) cream Apply to affected area once 30 g 3  . ondansetron (ZOFRAN) 8 MG tablet Take 1 tablet (8 mg total) by mouth every 8 (eight) hours as needed for nausea. 30 tablet 3  . pantoprazole (PROTONIX) 40 MG tablet Take 1 tablet (40 mg total) by mouth daily. 90 tablet 3  . predniSONE (DELTASONE) 20 MG tablet Take 2 tablets (40 mg total) by mouth daily. Start 40 mg daily on 2/23. After chemo, take 40 mg for 4 days 60 tablet 6  . prochlorperazine (COMPAZINE) 10 MG tablet Take 1 tablet (10 mg total) by mouth every 6 (six) hours as needed (Nausea or vomiting). 30 tablet 6  . senna (SENOKOT) 8.6 MG TABS tablet Take 2 tablets (17.2 mg total) by mouth daily as needed for mild constipation. 60 each 0   No current facility-administered medications for this visit.     PHYSICAL EXAMINATION: ECOG PERFORMANCE STATUS: 1 - Symptomatic but completely ambulatory  Vitals:   12/12/17 1420  BP: 140/84  Pulse: 96  Resp: 18  Temp: 97.7 F (36.5 C)  SpO2: 94%   Filed Weights   12/12/17 1420  Weight: 131 lb 6.4 oz (59.6 kg)    GENERAL:alert, no distress and comfortable SKIN: skin color, texture, turgor are normal, no rashes or significant lesions EYES: normal, Conjunctiva are pink and non-injected, sclera clear OROPHARYNX:no exudate, no erythema and lips, buccal mucosa, and tongue normal  NECK: supple, thyroid normal size, non-tender, without nodularity LYMPH:  no palpable lymphadenopathy in the cervical, axillary or inguinal LUNGS: clear to auscultation and percussion with normal breathing effort HEART: regular rate & rhythm and no murmurs and no lower extremity edema ABDOMEN:abdomen soft, non-tender and normal bowel sounds Musculoskeletal:no cyanosis of digits and no clubbing.  Well-healed surgical scar NEURO: alert & oriented x 3 with fluent speech, no focal motor/sensory deficits  LABORATORY DATA:  I have  reviewed the data as listed    Component Value Date/Time   NA 137 12/07/2017 1230   NA 142 12/28/2016 1002   K 2.9 (L) 12/07/2017 1230   K 4.6 12/28/2016 1002   CL 98 (L) 12/07/2017 1230   CL 101 03/28/2013 0826   CO2 27 12/07/2017 1230   CO2 29 12/28/2016 1002   GLUCOSE 109 (H) 12/07/2017 1230   GLUCOSE 85 12/28/2016 1002   GLUCOSE 84 03/28/2013 0826   BUN 15 12/07/2017 1230   BUN 20.3 12/28/2016 1002   CREATININE 0.83 12/07/2017 1230   CREATININE 0.9 12/28/2016 1002   CALCIUM 8.8 (L) 12/07/2017 1230   CALCIUM 10.1 12/28/2016 1002   PROT 6.7 12/07/2017 1230   PROT 6.8 12/28/2016 1002   ALBUMIN 3.9 12/07/2017 1230   ALBUMIN 4.2 12/28/2016 1002   AST 21 12/07/2017 1230   AST 20 12/28/2016 1002   ALT 12 (L) 12/07/2017 1230   ALT 12 12/28/2016 1002   ALKPHOS 139 (H) 12/07/2017 1230   ALKPHOS 111 12/28/2016 1002   BILITOT 0.7 12/07/2017 1230   BILITOT 0.55 12/28/2016 1002  GFRNONAA >60 12/07/2017 1230   GFRAA >60 12/07/2017 1230    No results found for: SPEP, UPEP  Lab Results  Component Value Date   WBC 8.5 12/07/2017   NEUTROABS 7.7 12/07/2017   HGB 12.0 12/07/2017   HCT 37.0 12/07/2017   MCV 100.0 12/07/2017   PLT 484 (H) 12/07/2017      Chemistry      Component Value Date/Time   NA 137 12/07/2017 1230   NA 142 12/28/2016 1002   K 2.9 (L) 12/07/2017 1230   K 4.6 12/28/2016 1002   CL 98 (L) 12/07/2017 1230   CL 101 03/28/2013 0826   CO2 27 12/07/2017 1230   CO2 29 12/28/2016 1002   BUN 15 12/07/2017 1230   BUN 20.3 12/28/2016 1002   CREATININE 0.83 12/07/2017 1230   CREATININE 0.9 12/28/2016 1002      Component Value Date/Time   CALCIUM 8.8 (L) 12/07/2017 1230   CALCIUM 10.1 12/28/2016 1002   ALKPHOS 139 (H) 12/07/2017 1230   ALKPHOS 111 12/28/2016 1002   AST 21 12/07/2017 1230   AST 20 12/28/2016 1002   ALT 12 (L) 12/07/2017 1230   ALT 12 12/28/2016 1002   BILITOT 0.7 12/07/2017 1230   BILITOT 0.55 12/28/2016 1002       RADIOGRAPHIC  STUDIES: I have personally reviewed the radiological images as listed and agreed with the findings in the report. Dg Shoulder Right  Result Date: 11/15/2017 CLINICAL DATA:  Left arm pain for the past 3 weeks.  No injury. EXAM: RIGHT SHOULDER - 2+ VIEW; RIGHT HUMERUS - 2+ VIEW COMPARISON:  None. FINDINGS: There is a acute, oblique fracture through the proximal humeral diaphysis with minimal lateral and posterior displacement. No additional fracture seen. Mild acromioclavicular osteoarthritis. Diffuse osteopenia. IMPRESSION: 1. Acute, minimally displaced fracture of the proximal humeral diaphysis. Electronically Signed   By: Titus Dubin M.D.   On: 11/15/2017 10:16   Mr Humerus Right W Wo Contrast  Result Date: 11/20/2017 CLINICAL DATA:  Nontraumatic fracture of the right humerus. History of lymphoma. Creatinine was obtained on site at [facility] at [address] Results: Creatinine 1.2 mg/dL. EXAM: MRI OF THE RIGHT HUMERUS WITHOUT AND WITH CONTRAST TECHNIQUE: Multiplanar, multisequence MR imaging of the right upper extremity was performed before and after the administration of intravenous contrast. CONTRAST:  37m MULTIHANCE GADOBENATE DIMEGLUMINE 529 MG/ML IV SOLN COMPARISON:  Radiographs 11/15/2017 FINDINGS: As demonstrated on the radiographs there is a displaced fracture involving the proximal humeral diaphysis with slight angulation. The marrow is completely replaced with low T1 and heterogeneous high T2 signal intensity most likely osseous lymphoma. This also demonstrates diffuse enhancement. The epiphyseal regions are spared. Edema/inflammation/hemorrhage noted surrounding the fracture and in the musculature and in the subcutaneous tissues. The visualized scapula and clavicle are unremarkable. No obvious rib lesions. IMPRESSION: Mildly displaced and angulated pathologic fracture of the proximal humeral shaft. 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  Electronically Signed   By: PMarijo SanesM.D.   On: 11/20/2017 08:17   Nm Pet Image Restag (ps) Skull Base To Thigh  Result Date: 11/22/2017 CLINICAL DATA:  Subsequent treatment strategy for non-Hodgkin lymphoma. EXAM: NUCLEAR MEDICINE PET SKULL BASE TO THIGH TECHNIQUE: 6.7 mCi F-18 FDG was injected intravenously. Full-ring PET imaging was performed from the skull base to thigh after the radiotracer. CT data was obtained and used for attenuation correction and anatomic localization. FASTING BLOOD GLUCOSE:  Value: 95 mg/dl COMPARISON:  CT on  09/16/2014 and PET-CT on 09/09/2009 FINDINGS: NECK:  No hypermetabolic lymph nodes or masses. CHEST: New Sub-cm hypermetabolic lymph nodes are seen in the right internal mammary chain and along the posterior wall the right mainstem bronchus, with SUV max of 11.1. New hypermetabolic activity is also seen in the right hilum, with SUV max of 5.7. No suspicious pulmonary nodules are seen on CT images. ABDOMEN/PELVIS: New large hypermetabolic mass is seen in the posterior right hepatic lobe with SUV max of 33.5. No evidence of splenic involvement. A focal hypermetabolic lesion is seen lateral midpole of the left kidney which has SUV max of 18.5. This is suspicious for renal involvement by lymphoma, however primary renal cell carcinoma cannot be excluded. Hypermetabolic lymphadenopathy is seen in the right cardiophrenic angle, porta hepatis, and abdominal retroperitoneum in the retrocaval, aortocaval and left paraaortic spaces. Index lymphadenopathy in the porta hepatis measures 3.7 cm in short axis on image 103/4, with SUV max of 30.3. No pelvic lymphadenopathy identified. Small hiatal hernia again noted. SKELETON: Hypermetabolic mass is seen at site of pathologic fracture in the proximal humeral diaphysis, which has SUV max of 17.6. A sub-cm hypermetabolic lymph node is seen along the lateral aspect of this mass in the adjacent subcutaneous tissues with SUV max of 5.7. Small  foci of hypermetabolic activity are also seen in the right posterior chest wall in the intercostal space between the right posterior 10th and 11th ribs. IMPRESSION: New hypermetabolic lymphadenopathy within the chest and abdomen, consistent with recurrent lymphoma. New large hypermetabolic mass in the right hepatic lobe, consistent with lymphoma this involvement. New small hypermetabolic mass in the left kidney, which may be due to lymphomatous involvement although primary renal cell carcinoma cannot be excluded. Hypermetabolic mass in the right humerus with pathologic fracture, consistent with lymphomatous involvement. Small hypermetabolic foci in right posterior chest wall, also suspicious for recurrent lymphoma. Electronically Signed   By: Earle Gell M.D.   On: 11/22/2017 10:47   US Biopsy (liver)  Result Date: 11/25/2017 INDICATION: 77 year old female with a history of lymphoma.  Liver mass EXAM: ULTRASOUND-GUIDED LIVER MASS BIOPSY MEDICATIONS: None. ANESTHESIA/SEDATION: Moderate (conscious) sedation was employed during this procedure. A total of Versed 1.0 mg and Fentanyl 50 mcg was administered intravenously. Moderate Sedation Time: 10 minutes. The patient's level of consciousness and vital signs were monitored continuously by radiology nursing throughout the procedure under my direct supervision. FLUOROSCOPY TIME:  NONE COMPLICATIONS: NONE PROCEDURE: Informed written consent was obtained from the patient after a thorough discussion of the procedural risks, benefits and alternatives. All questions were addressed. Maximal Sterile Barrier Technique was utilized including caps, mask, sterile gowns, sterile gloves, sterile drape, hand hygiene and skin antiseptic. A timeout was performed prior to the initiation of the procedure. Patient positioned supine position on the ultrasound stretcher. Images were stored of the upper abdomen. Patient is prepped and draped in the usual sterile fashion. 1% lidocaine was  used for local anesthesia. Using ultrasound guidance, guide needle was advanced to the liver mass. Multiple 18 gauge core biopsy were achieved. Patient tolerated the procedure well and remained hemodynamically stable throughout. No complications were encountered and no significant blood loss. IMPRESSION: Status post ultrasound-guided biopsy of liver mass. Tissue specimen sent to pathology for complete histopathologic analysis. Signed, Dulcy Fanny. Earleen Newport, DO [facility] Radiology Electronically Signed   By: Corrie Mckusick D.O.   On: 11/25/2017 09:26   Ir US Guide Vasc Access Right  Result Date: 12/07/2017 INDICATION: History of  lymphoma. In need of durable intravenous access for chemotherapy administration. EXAM: IMPLANTED PORT A CATH PLACEMENT WITH ULTRASOUND AND FLUOROSCOPIC GUIDANCE COMPARISON:  PET-CT - 11/22/2016 MEDICATIONS: Clindamycin 600 mg IV; The antibiotic was administered within an appropriate time interval prior to skin puncture. ANESTHESIA/SEDATION: Moderate (conscious) sedation was employed during this procedure. A total of Versed 1 mg and Fentanyl 75 mcg was administered intravenously. Moderate Sedation Time: 30 minutes. The patient's level of consciousness and vital signs were monitored continuously by radiology nursing throughout the procedure under my direct supervision. CONTRAST:  None FLUOROSCOPY TIME:  54 seconds (5 mGy) COMPLICATIONS: None immediate. PROCEDURE: The procedure, risks, benefits, and alternatives were explained to the patient. Questions regarding the procedure were encouraged and answered. The patient understands and consents to the procedure. The right neck and chest were prepped with chlorhexidine in a sterile fashion, and a sterile drape was applied covering the operative field. Maximum barrier sterile technique with sterile gowns and gloves were used for the procedure. A timeout was performed prior to the initiation of the  procedure. Local anesthesia was provided with 1% lidocaine with epinephrine. After creating a small venotomy incision, a micropuncture kit was utilized to access the internal jugular vein. Real-time ultrasound guidance was utilized for vascular access including the acquisition of a permanent ultrasound image documenting patency of the accessed vessel. The microwire was utilized to measure appropriate catheter length. A subcutaneous port pocket was then created along the upper chest wall utilizing a combination of sharp and blunt dissection. The pocket was irrigated with sterile saline. A single lumen thin power injectable port was chosen for placement. The 8 Fr catheter was tunneled from the port pocket site to the venotomy incision. The port was placed in the pocket. The external catheter was trimmed to appropriate length. At the venotomy, an 8 Fr peel-away sheath was placed over a guidewire under fluoroscopic guidance. The catheter was then placed through the sheath and the sheath was removed. Final catheter positioning was confirmed and documented with a fluoroscopic spot radiograph. The port was accessed with a Huber needle, aspirated and flushed with heparinized saline. The venotomy site was closed with an interrupted 4-0 Vicryl suture. The port pocket incision was closed with interrupted 2-0 Vicryl suture and the skin was opposed with a running subcuticular 4-0 Vicryl suture. Dermabond and Steri-strips were applied to both incisions. Dressings were placed. The patient tolerated the procedure well without immediate post procedural complication. FINDINGS: After catheter placement, the tip lies within the superior cavoatrial junction. The catheter aspirates and flushes normally and is ready for immediate use. IMPRESSION: Successful placement of a right internal jugular approach power injectable Port-A-Cath. The catheter is ready for immediate use. Electronically Signed   By: Sandi Mariscal M.D.   On: 12/07/2017  15:33   Dg Humerus Right  Result Date: 11/23/2017 CLINICAL DATA:  The patient has undergone ORIF for a pathologic fracture through the junction of the proximal and mid thirds of the shaft of the right humerus. EXAM: RIGHT HUMERUS - 2+ VIEW COMPARISON:  Intraoperative fluoro spot radiographs of today's date FINDINGS: The patient has undergone placement of an intramedullary rod with securing screws for fixation of a fracture at the junction of the proximal and mid thirds of the right humerus. Alignment is now more nearly anatomic. The observed portions of the shoulder and elbow are grossly normal. IMPRESSION: No postprocedure complication following ORIF for a pathologic right humeral shaft fracture. Electronically Signed   By: David  Martinique M.D.  On: 11/23/2017 15:26   Dg Humerus Right  Result Date: 11/23/2017 CLINICAL DATA:  Lymphoma.  Pathologic right humeral fracture. EXAM: DG C-ARM 61-120 MIN; RIGHT HUMERUS - 2+ VIEW COMPARISON:  Radiographs 11/15/2017.  MRI 11/19/2017. FLUOROSCOPY TIME:  2 min and 30 sec. C-arm fluoroscopic images were obtained intraoperatively and submitted for post operative interpretation. Please see the performing provider's procedural report for the fluoroscopy time utilized. FINDINGS: Nine spot fluoroscopic images are submitted. Initial images demonstrate an angulated pathologic fracture of the proximal right humeral diaphysis. Subsequent images demonstrate the placement of a right humeral intramedullary nail secured by 2 proximal and 1 distal interlocking screws. There is near anatomic reduction of the fracture. No complications identified. IMPRESSION: Near anatomic reduction of pathologic proximal right humeral fracture post intramedullary nailing. Electronically Signed   By: Richardean Sale M.D.   On: 11/23/2017 15:06   Dg Humerus Right  Result Date: 11/15/2017 CLINICAL DATA:  Left arm pain for the past 3 weeks.  No injury. EXAM: RIGHT SHOULDER - 2+ VIEW; RIGHT HUMERUS - 2+  VIEW COMPARISON:  None. FINDINGS: There is a acute, oblique fracture through the proximal humeral diaphysis with minimal lateral and posterior displacement. No additional fracture seen. Mild acromioclavicular osteoarthritis. Diffuse osteopenia. IMPRESSION: 1. Acute, minimally displaced fracture of the proximal humeral diaphysis. Electronically Signed   By: Titus Dubin M.D.   On: 11/15/2017 10:16   Dg C-arm 1-60 Min  Result Date: 11/23/2017 CLINICAL DATA:  Lymphoma.  Pathologic right humeral fracture. EXAM: DG C-ARM 61-120 MIN; RIGHT HUMERUS - 2+ VIEW COMPARISON:  Radiographs 11/15/2017.  MRI 11/19/2017. FLUOROSCOPY TIME:  2 min and 30 sec. C-arm fluoroscopic images were obtained intraoperatively and submitted for post operative interpretation. Please see the performing provider's procedural report for the fluoroscopy time utilized. FINDINGS: Nine spot fluoroscopic images are submitted. Initial images demonstrate an angulated pathologic fracture of the proximal right humeral diaphysis. Subsequent images demonstrate the placement of a right humeral intramedullary nail secured by 2 proximal and 1 distal interlocking screws. There is near anatomic reduction of the fracture. No complications identified. IMPRESSION: Near anatomic reduction of pathologic proximal right humeral fracture post intramedullary nailing. Electronically Signed   By: Richardean Sale M.D.   On: 11/23/2017 15:06   Ir Fluoro Guide Port Insertion Right  Result Date: 12/07/2017 INDICATION: History of lymphoma. In need of durable intravenous access for chemotherapy administration. EXAM: IMPLANTED PORT A CATH PLACEMENT WITH ULTRASOUND AND FLUOROSCOPIC GUIDANCE COMPARISON:  PET-CT - 11/22/2016 MEDICATIONS: Clindamycin 600 mg IV; The antibiotic was administered within an appropriate time interval prior to skin puncture. ANESTHESIA/SEDATION: Moderate (conscious) sedation was employed during this procedure. A total of Versed 1 mg and Fentanyl 75  mcg was administered intravenously. Moderate Sedation Time: 30 minutes. The patient's level of consciousness and vital signs were monitored continuously by radiology nursing throughout the procedure under my direct supervision. CONTRAST:  None FLUOROSCOPY TIME:  54 seconds (5 mGy) COMPLICATIONS: None immediate. PROCEDURE: The procedure, risks, benefits, and alternatives were explained to the patient. Questions regarding the procedure were encouraged and answered. The patient understands and consents to the procedure. The right neck and chest were prepped with chlorhexidine in a sterile fashion, and a sterile drape was applied covering the operative field. Maximum barrier sterile technique with sterile gowns and gloves were used for the procedure. A timeout was performed prior to the initiation of the procedure. Local anesthesia was provided with 1% lidocaine with epinephrine. After creating a small venotomy incision, a micropuncture kit was  utilized to access the internal jugular vein. Real-time ultrasound guidance was utilized for vascular access including the acquisition of a permanent ultrasound image documenting patency of the accessed vessel. The microwire was utilized to measure appropriate catheter length. A subcutaneous port pocket was then created along the upper chest wall utilizing a combination of sharp and blunt dissection. The pocket was irrigated with sterile saline. A single lumen thin power injectable port was chosen for placement. The 8 Fr catheter was tunneled from the port pocket site to the venotomy incision. The port was placed in the pocket. The external catheter was trimmed to appropriate length. At the venotomy, an 8 Fr peel-away sheath was placed over a guidewire under fluoroscopic guidance. The catheter was then placed through the sheath and the sheath was removed. Final catheter positioning was confirmed and documented with a fluoroscopic spot radiograph. The port was accessed with a  Huber needle, aspirated and flushed with heparinized saline. The venotomy site was closed with an interrupted 4-0 Vicryl suture. The port pocket incision was closed with interrupted 2-0 Vicryl suture and the skin was opposed with a running subcuticular 4-0 Vicryl suture. Dermabond and Steri-strips were applied to both incisions. Dressings were placed. The patient tolerated the procedure well without immediate post procedural complication. FINDINGS: After catheter placement, the tip lies within the superior cavoatrial junction. The catheter aspirates and flushes normally and is ready for immediate use. IMPRESSION: Successful placement of a right internal jugular approach power injectable Port-A-Cath. The catheter is ready for immediate use. Electronically Signed   By: Sandi Mariscal M.D.   On: 12/07/2017 15:33    All questions were answered. The patient knows to call the clinic with any problems, questions or concerns. No barriers to learning was detected.  I spent 15 minutes counseling the patient face to face. The total time spent in the appointment was 20 minutes and more than 50% was on counseling and review of test results  [name], MD 12/12/2017 2:56 PM

## 2017-12-12 NOTE — Patient Instructions (Signed)
Pegfilgrastim injection What is this medicine? PEGFILGRASTIM (PEG fil gra stim) is a long-acting granulocyte colony-stimulating factor that stimulates the growth of neutrophils, a type of white blood cell important in the body's fight against infection. It is used to reduce the incidence of fever and infection in patients with certain types of cancer who are receiving chemotherapy that affects the bone marrow, and to increase survival after being exposed to high doses of radiation. This medicine may be used for other purposes; ask your health care provider or pharmacist if you have questions. COMMON BRAND NAME(S): Neulasta What should I tell my health care provider before I take this medicine? They need to know if you have any of these conditions: -kidney disease -latex allergy -ongoing radiation therapy -sickle cell disease -skin reactions to acrylic adhesives (On-Body Injector only) -an unusual or allergic reaction to pegfilgrastim, filgrastim, other medicines, foods, dyes, or preservatives -pregnant or trying to get pregnant -breast-feeding How should I use this medicine? This medicine is for injection under the skin. If you get this medicine at home, you will be taught how to prepare and give the pre-filled syringe or how to use the On-body Injector. Refer to the patient Instructions for Use for detailed instructions. Use exactly as directed. Tell your healthcare provider immediately if you suspect that the On-body Injector may not have performed as intended or if you suspect the use of the On-body Injector resulted in a missed or partial dose. It is important that you put your used needles and syringes in a special sharps container. Do not put them in a trash can. If you do not have a sharps container, call your pharmacist or healthcare provider to get one. Talk to your pediatrician regarding the use of this medicine in children. While this drug may be prescribed for selected conditions,  precautions do apply. Overdosage: If you think you have taken too much of this medicine contact a poison control center or emergency room at once. NOTE: This medicine is only for you. Do not share this medicine with others. What if I miss a dose? It is important not to miss your dose. Call your doctor or health care professional if you miss your dose. If you miss a dose due to an On-body Injector failure or leakage, a new dose should be administered as soon as possible using a single prefilled syringe for manual use. What may interact with this medicine? Interactions have not been studied. Give your health care provider a list of all the medicines, herbs, non-prescription drugs, or dietary supplements you use. Also tell them if you smoke, drink alcohol, or use illegal drugs. Some items may interact with your medicine. This list may not describe all possible interactions. Give your health care provider a list of all the medicines, herbs, non-prescription drugs, or dietary supplements you use. Also tell them if you smoke, drink alcohol, or use illegal drugs. Some items may interact with your medicine. What should I watch for while using this medicine? You may need blood work done while you are taking this medicine. If you are going to need a MRI, CT scan, or other procedure, tell your doctor that you are using this medicine (On-Body Injector only). What side effects may I notice from receiving this medicine? Side effects that you should report to your doctor or health care professional as soon as possible: -allergic reactions like skin rash, itching or hives, swelling of the face, lips, or tongue -dizziness -fever -pain, redness, or irritation at site   where injected -pinpoint red spots on the skin -red or dark-brown urine -shortness of breath or breathing problems -stomach or side pain, or pain at the shoulder -swelling -tiredness -trouble passing urine or change in the amount of urine Side  effects that usually do not require medical attention (report to your doctor or health care professional if they continue or are bothersome): -bone pain -muscle pain This list may not describe all possible side effects. Call your doctor for medical advice about side effects. You may report side effects to FDA at 1-800-FDA-1088. Where should I keep my medicine? Keep out of the reach of children. Store pre-filled syringes in a refrigerator between 2 and 8 degrees C (36 and 46 degrees F). Do not freeze. Keep in carton to protect from light. Throw away this medicine if it is left out of the refrigerator for more than 48 hours. Throw away any unused medicine after the expiration date. NOTE: This sheet is a summary. It may not cover all possible information. If you have questions about this medicine, talk to your doctor, pharmacist, or health care provider.  2018 [employer] Standard (2016-09-23 12:58:03)  

## 2017-12-12 NOTE — Assessment & Plan Note (Signed)
She has no pain since chemotherapy Will observe closely

## 2017-12-12 NOTE — Telephone Encounter (Signed)
Chemo f/u call.  See flowsheet 

## 2017-12-12 NOTE — Assessment & Plan Note (Signed)
The nausea could be related to a component of gastric reflux I recommend antiacids as needed The last dose of her prednisone should be tomorrow She would continue to take antiemetics as needed

## 2017-12-13 ENCOUNTER — Encounter: Payer: Self-pay | Admitting: Adult Health

## 2017-12-15 ENCOUNTER — Other Ambulatory Visit: Payer: Self-pay

## 2017-12-15 ENCOUNTER — Encounter: Payer: Self-pay | Admitting: Hematology and Oncology

## 2017-12-15 ENCOUNTER — Inpatient Hospital Stay (HOSPITAL_BASED_OUTPATIENT_CLINIC_OR_DEPARTMENT_OTHER): Payer: Medicare Other | Admitting: Hematology and Oncology

## 2017-12-15 ENCOUNTER — Telehealth: Payer: Self-pay

## 2017-12-15 VITALS — BP 107/55 | HR 106 | Temp 97.7°F | Resp 18 | Ht 64.0 in | Wt 127.2 lb

## 2017-12-15 DIAGNOSIS — R059 Cough, unspecified: Secondary | ICD-10-CM

## 2017-12-15 DIAGNOSIS — R51 Headache: Secondary | ICD-10-CM | POA: Diagnosis not present

## 2017-12-15 DIAGNOSIS — R05 Cough: Secondary | ICD-10-CM | POA: Diagnosis not present

## 2017-12-15 DIAGNOSIS — C8588 Other specified types of non-Hodgkin lymphoma, lymph nodes of multiple sites: Secondary | ICD-10-CM

## 2017-12-15 DIAGNOSIS — R519 Headache, unspecified: Secondary | ICD-10-CM

## 2017-12-15 DIAGNOSIS — C8338 Diffuse large B-cell lymphoma, lymph nodes of multiple sites: Secondary | ICD-10-CM

## 2017-12-15 DIAGNOSIS — R12 Heartburn: Secondary | ICD-10-CM

## 2017-12-15 DIAGNOSIS — K1231 Oral mucositis (ulcerative) due to antineoplastic therapy: Secondary | ICD-10-CM | POA: Diagnosis not present

## 2017-12-15 MED ORDER — MAGIC MOUTHWASH W/LIDOCAINE: 10.0000 mL | Freq: Four times a day (QID) | ORAL | 0 refills | Status: DC

## 2017-12-15 MED ORDER — LEVOFLOXACIN 500 MG PO TABS: 500.0000 mg | ORAL_TABLET | Freq: Every day | ORAL | 0 refills | Status: DC

## 2017-12-15 NOTE — Telephone Encounter (Signed)
Called back. Scheduling message sent for 1 pm appt today.

## 2017-12-15 NOTE — Assessment & Plan Note (Signed)
Most of her symptoms are likely related to recent chemotherapy Continue supportive care

## 2017-12-15 NOTE — Assessment & Plan Note (Signed)
She has frequent headaches likely due to poor sleep, recent steroid withdrawal and stress from recent treatment I recommend conservative management with caffeinated beverages along with her pain medicine as needed

## 2017-12-15 NOTE — Assessment & Plan Note (Signed)
She have signs and symptoms to suggest severe reflux disease, likely exacerbated by recent steroid treatment I recommend proton pump inhibitor in the morning, Zantac at night and Tums as needed

## 2017-12-15 NOTE — Progress Notes (Signed)
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  However, given recent chemotherapy and her age, the patient is immunocompromised I recommend prescription levofloxacin to hang onto If her cough gets worse or the patient starts to develop fever, she has my permission to take prescription antibiotics and then to call me the next business day for further evaluation  Heart burn She have signs and symptoms to suggest severe reflux disease, likely exacerbated by recent steroid treatment I recommend proton pump inhibitor in the morning, Zantac at night and Tums as needed  Frequent headaches She has frequent headaches likely due to poor sleep, recent steroid withdrawal and stress from recent treatment I recommend conservative management with caffeinated beverages along with her pain medicine as needed   No orders of the defined types were placed in this encounter.   INTERVAL HISTORY: Please see below for problem oriented charting. She is seen urgently per patient request due to feeling unwell Since the last time I saw her, she sleeps poorly, with frequent headaches, heartburn sensation, mucositis pain with throat pain, nonproductive cough and with poor appetite She denies fever or chills Mucositis pain has been present since recent hospitalization Her cough is generally at nighttime and typically nonproductive It is interfering with his sleep Most of her symptoms do get better with some minor pain medicine She denies nausea, vomiting or changes  in bowel habits No new bone pain.  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 She was treated with chlorambucil in 2000.   --2007 - 09/2008.  Treated with single agent Rituxan.   --12/2008.  She had progressive adenopathy in chest and abdomen and repeat biopsy showed low grade follicular and diffuse B cell NHL.   --01/2009 - 07/2009. She required left ureteral stent and was treated with Treanda/Rituxan x 5 cycles from 02-2009 thru 06-2009.       11/20/2008 Imaging    Mildly displaced and angulated pathologic fracture of the proximal humeral shaft. 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       09/16/2014 Imaging    No lymphadenopathy in the chest, abdomen, or pelvis. The small right axillary lymph nodes seen on the previous exam are unchanged in the 18 month interval. Small to moderate hiatal hernia.      11/22/2017 PET scan    New hypermetabolic lymphadenopathy within the chest and abdomen, consistent with recurrent lymphoma. New large hypermetabolic mass in the right hepatic lobe, consistent with lymphoma this involvement. New small hypermetabolic mass in the left kidney, which may be due to lymphomatous involvement although primary renal cell carcinoma cannot be excluded. Hypermetabolic mass in the right humerus with pathologic fracture, consistent with lymphomatous involvement. Small hypermetabolic foci in right posterior chest wall, also  suspicious for recurrent lymphoma.      11/23/2017 Procedure    Bone, fragment(s), Right Humeral Shaft Reamings - ATYPICAL LYMPHOID INFILTRATE CONSISTENT WITH NON-HODGKIN'S B CELL  LYMPHOMA. - SEE ONCOLOGY TABLE. Microscopic Comment LYMPHOMA Histologic type: Non-Hodgkins lymphoma, favor large cell type. Grade (if applicable): Favor high grade. Flow cytometry: No specimen is available for analysis. Immunohistochemical stains: BCL-2, BCL-6, CD3, CD5, CD10, CD15, CD20, CD30, CD34, CD43, LCA, CD79a, CD138, Ki-67, PAX-5 and TdT with appropriate controls as performed on block 1C. Touch preps/imprints: Not performed. Comments: The sections show multiple, primarily soft tissue fragments displaying a dense lymphoid infiltrate characterized by a mixture of small round to slightly irregular lymphocytes, histiocytes, and large centroblastic appearing lymphoid cells with partially clumped to vesicular chromatin, small nucleoli and amphophilic to clear cytoplasm. The number of admixed large lymphoid cells is variable but they appear relatively abundant in some areas associated with clustering. No material is available for flow cytometric analysis. Hence, a large batter of immunohistochemical stains were performed and show a mixture of T and B cells but with a significant B cell component mostly composed of large lymphoid cells with positivity for CD79a, PAX-5, CD30, BCL-2, and BCL-6. LCA shows diffuse staining although some of the large lymphoid cells appear negative. No significant staining is seen with CD20, CD10, CD15, CD34, TdT, or CD138. The lack of CD20 expression in this material is possibly related to prior treatment. There is variable increased expression of Ki-67 ranging from 10 to >50% in some areas particularly where there is abundance of large lymphoid cells. There is admixed abundant T cell population in the background as seen with CD3, CD5, and CD43 with on apparent co-expression of CD5 in B cell areas. The overall findings are consistent with involvement by non-Hodgkin's B cell lymphoma and the presence of relative abundance of large lymphoid B-cells and increased Ki-67  expression favor a high grade large B cell lymphoma.      11/23/2017 Surgery    1. CPT 24516-Intramedullary nailing of right humerus fracture 2. CPT 20245-Bone biopsy, deep        11/23/2017 - 11/25/2017 Hospital Admission    She was admitted to the hospital for management of humerus fracture and aggressive lymphoma      11/24/2017 Imaging    ECHO showed LV EF: 65% -   70%      11/25/2017 Procedure    Status post ultrasound-guided biopsy of liver mass. Tissue specimen sent to pathology for complete histopathologic analysis      12/07/2017 Imaging    Successful placement of a right internal jugular approach power injectable Port-A-Cath. The catheter is ready for immediate use.       REVIEW OF SYSTEMS:   Constitutional: Denies fevers, chills or abnormal weight loss Eyes: Denies blurriness of vision Cardiovascular: Denies palpitation, chest discomfort or lower extremity swelling Skin: Denies abnormal skin rashes Lymphatics: Denies new lymphadenopathy or easy bruising Behavioral/Psych: Mood is stable, no new changes  All other systems were reviewed with the patient and are negative.  I have reviewed the past medical history, past surgical history, social history and family history with the patient and they are unchanged from previous note.  ALLERGIES:  is allergic to contrast media [iodinated diagnostic agents]; iohexol; povidone-iodine; penicillins; and adhesive [tape].  MEDICATIONS:  Current Outpatient Medications  Medication Sig Dispense Refill  . allopurinol (ZYLOPRIM) 300 MG tablet Take 1 tablet (300 mg total) by mouth daily. 30 tablet 0  . allopurinol (ZYLOPRIM) 300 MG tablet Take  1 tablet (300 mg total) by mouth daily. 30 tablet 3  . aspirin 81 MG tablet Take 81 mg by mouth daily.    . citalopram (CELEXA) 40 MG tablet Take 1 tablet (40 mg total) by mouth daily. 90 tablet 1  . fluticasone furoate-vilanterol (BREO ELLIPTA) 100-25 MCG/INH AEPB Inhale 1 puff into the lungs  every morning.    Marland Kitchen HYDROcodone-acetaminophen (NORCO/VICODIN) 5-325 MG tablet Take 1 tablet by mouth every 4 (four) hours as needed for moderate pain. 60 tablet 0  . ibuprofen (ADVIL,MOTRIN) 200 MG tablet Take 200 mg by mouth every 6 (six) hours as needed for moderate pain.    Marland Kitchen levofloxacin (LEVAQUIN) 500 MG tablet Take 1 tablet (500 mg total) by mouth daily. 7 tablet 0  . lidocaine-prilocaine (EMLA) cream Apply to affected area once 30 g 3  . magic mouthwash w/lidocaine SOLN Take 10 mLs by mouth 4 (four) times daily. Swish and swallow 10 ml QID 240 mL 0  . ondansetron (ZOFRAN) 8 MG tablet Take 1 tablet (8 mg total) by mouth every 8 (eight) hours as needed for nausea. 30 tablet 3  . pantoprazole (PROTONIX) 40 MG tablet Take 1 tablet (40 mg total) by mouth daily. 90 tablet 3  . predniSONE (DELTASONE) 20 MG tablet Take 2 tablets (40 mg total) by mouth daily. Start 40 mg daily on 2/23. After chemo, take 40 mg for 4 days 60 tablet 6  . prochlorperazine (COMPAZINE) 10 MG tablet Take 1 tablet (10 mg total) by mouth every 6 (six) hours as needed (Nausea or vomiting). 30 tablet 6  . senna (SENOKOT) 8.6 MG TABS tablet Take 2 tablets (17.2 mg total) by mouth daily as needed for mild constipation. 60 each 0   No current facility-administered medications for this visit.     PHYSICAL EXAMINATION: ECOG PERFORMANCE STATUS: 2 - Symptomatic, <50% confined to bed  Vitals:   12/15/17 1304  BP: (!) 107/55  Pulse: (!) 106  Resp: 18  Temp: 97.7 F (36.5 C)  SpO2: 97%   Filed Weights   12/15/17 1304  Weight: 127 lb 3.2 oz (57.7 kg)    GENERAL:alert, no distress and comfortable SKIN: skin color, texture, turgor are normal, no rashes or significant lesions EYES: normal, Conjunctiva are pink and non-injected, sclera clear OROPHARYNX:no exudate, no erythema and lips, buccal mucosa, and tongue normal  NECK: supple, thyroid normal size, non-tender, without nodularity LYMPH:  no palpable lymphadenopathy in  the cervical, axillary or inguinal LUNGS: clear to auscultation and percussion with normal breathing effort HEART: regular rate & rhythm and no murmurs and no lower extremity edema ABDOMEN:abdomen soft, non-tender and normal bowel sounds Musculoskeletal:no cyanosis of digits and no clubbing  NEURO: alert & oriented x 3 with fluent speech, no focal motor/sensory deficits  LABORATORY DATA:  I have reviewed the data as listed    Component Value Date/Time   NA 137 12/07/2017 1230   NA 142 12/28/2016 1002   K 2.9 (L) 12/07/2017 1230   K 4.6 12/28/2016 1002   CL 98 (L) 12/07/2017 1230   CL 101 03/28/2013 0826   CO2 27 12/07/2017 1230   CO2 29 12/28/2016 1002   GLUCOSE 109 (H) 12/07/2017 1230   GLUCOSE 85 12/28/2016 1002   GLUCOSE 84 03/28/2013 0826   BUN 15 12/07/2017 1230   BUN 20.3 12/28/2016 1002   CREATININE 0.83 12/07/2017 1230   CREATININE 0.9 12/28/2016 1002   CALCIUM 8.8 (L) 12/07/2017 1230   CALCIUM 10.1 12/28/2016 1002  PROT 6.7 12/07/2017 1230   PROT 6.8 12/28/2016 1002   ALBUMIN 3.9 12/07/2017 1230   ALBUMIN 4.2 12/28/2016 1002   AST 21 12/07/2017 1230   AST 20 12/28/2016 1002   ALT 12 (L) 12/07/2017 1230   ALT 12 12/28/2016 1002   ALKPHOS 139 (H) 12/07/2017 1230   ALKPHOS 111 12/28/2016 1002   BILITOT 0.7 12/07/2017 1230   BILITOT 0.55 12/28/2016 1002   GFRNONAA >60 12/07/2017 1230   GFRAA >60 12/07/2017 1230    No results found for: SPEP, UPEP  Lab Results  Component Value Date   WBC 8.5 12/07/2017   NEUTROABS 7.7 12/07/2017   HGB 12.0 12/07/2017   HCT 37.0 12/07/2017   MCV 100.0 12/07/2017   PLT 484 (H) 12/07/2017      Chemistry      Component Value Date/Time   NA 137 12/07/2017 1230   NA 142 12/28/2016 1002   K 2.9 (L) 12/07/2017 1230   K 4.6 12/28/2016 1002   CL 98 (L) 12/07/2017 1230   CL 101 03/28/2013 0826   CO2 27 12/07/2017 1230   CO2 29 12/28/2016 1002   BUN 15 12/07/2017 1230   BUN 20.3 12/28/2016 1002   CREATININE 0.83  12/07/2017 1230   CREATININE 0.9 12/28/2016 1002      Component Value Date/Time   CALCIUM 8.8 (L) 12/07/2017 1230   CALCIUM 10.1 12/28/2016 1002   ALKPHOS 139 (H) 12/07/2017 1230   ALKPHOS 111 12/28/2016 1002   AST 21 12/07/2017 1230   AST 20 12/28/2016 1002   ALT 12 (L) 12/07/2017 1230   ALT 12 12/28/2016 1002   BILITOT 0.7 12/07/2017 1230   BILITOT 0.55 12/28/2016 1002       RADIOGRAPHIC STUDIES: I have personally reviewed the radiological images as listed and agreed with the findings in the report. Mr Humerus Right W Wo Contrast  Result Date: 11/20/2017 CLINICAL DATA:  Nontraumatic fracture of the right humerus. History of lymphoma. Creatinine was obtained on site at [facility] at [address] Results: Creatinine 1.2 mg/dL. EXAM: MRI OF THE RIGHT HUMERUS WITHOUT AND WITH CONTRAST TECHNIQUE: Multiplanar, multisequence MR imaging of the right upper extremity was performed before and after the administration of intravenous contrast. CONTRAST:  77m MULTIHANCE GADOBENATE DIMEGLUMINE 529 MG/ML IV SOLN COMPARISON:  Radiographs 11/15/2017 FINDINGS: As demonstrated on the radiographs there is a displaced fracture involving the proximal humeral diaphysis with slight angulation. The marrow is completely replaced with low T1 and heterogeneous high T2 signal intensity most likely osseous lymphoma. This also demonstrates diffuse enhancement. The epiphyseal regions are spared. Edema/inflammation/hemorrhage noted surrounding the fracture and in the musculature and in the subcutaneous tissues. The visualized scapula and clavicle are unremarkable. No obvious rib lesions. IMPRESSION: Mildly displaced and angulated pathologic fracture of the proximal humeral shaft. 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 Electronically Signed   By: PMarijo SanesM.D.   On: 11/20/2017 08:17   Nm Pet Image Restag (ps) Skull Base To Thigh  Result Date: 11/22/2017 CLINICAL DATA:   Subsequent treatment strategy for non-Hodgkin lymphoma. EXAM: NUCLEAR MEDICINE PET SKULL BASE TO THIGH TECHNIQUE: 6.7 mCi F-18 FDG was injected intravenously. Full-ring PET imaging was performed from the skull base to thigh after the radiotracer. CT data was obtained and used for attenuation correction and anatomic localization. FASTING BLOOD GLUCOSE:  Value: 95 mg/dl COMPARISON:  CT on 09/16/2014 and PET-CT on 09/09/2009 FINDINGS: NECK:  No hypermetabolic lymph nodes or masses. CHEST:  New Sub-cm hypermetabolic lymph nodes are seen in the right internal mammary chain and along the posterior wall the right mainstem bronchus, with SUV max of 11.1. New hypermetabolic activity is also seen in the right hilum, with SUV max of 5.7. No suspicious pulmonary nodules are seen on CT images. ABDOMEN/PELVIS: New large hypermetabolic mass is seen in the posterior right hepatic lobe with SUV max of 33.5. No evidence of splenic involvement. A focal hypermetabolic lesion is seen lateral midpole of the left kidney which has SUV max of 18.5. This is suspicious for renal involvement by lymphoma, however primary renal cell carcinoma cannot be excluded. Hypermetabolic lymphadenopathy is seen in the right cardiophrenic angle, porta hepatis, and abdominal retroperitoneum in the retrocaval, aortocaval and left paraaortic spaces. Index lymphadenopathy in the porta hepatis measures 3.7 cm in short axis on image 103/4, with SUV max of 30.3. No pelvic lymphadenopathy identified. Small hiatal hernia again noted. SKELETON: Hypermetabolic mass is seen at site of pathologic fracture in the proximal humeral diaphysis, which has SUV max of 17.6. A sub-cm hypermetabolic lymph node is seen along the lateral aspect of this mass in the adjacent subcutaneous tissues with SUV max of 5.7. Small foci of hypermetabolic activity are also seen in the right posterior chest wall in the intercostal space between the right posterior 10th and 11th ribs. IMPRESSION:  New hypermetabolic lymphadenopathy within the chest and abdomen, consistent with recurrent lymphoma. New large hypermetabolic mass in the right hepatic lobe, consistent with lymphoma this involvement. New small hypermetabolic mass in the left kidney, which may be due to lymphomatous involvement although primary renal cell carcinoma cannot be excluded. Hypermetabolic mass in the right humerus with pathologic fracture, consistent with lymphomatous involvement. Small hypermetabolic foci in right posterior chest wall, also suspicious for recurrent lymphoma. Electronically Signed   By: Earle Gell M.D.   On: 11/22/2017 10:47   US Biopsy (liver)  Result Date: 11/25/2017 INDICATION: 77 year old female with a history of lymphoma.  Liver mass EXAM: ULTRASOUND-GUIDED LIVER MASS BIOPSY MEDICATIONS: None. ANESTHESIA/SEDATION: Moderate (conscious) sedation was employed during this procedure. A total of Versed 1.0 mg and Fentanyl 50 mcg was administered intravenously. Moderate Sedation Time: 10 minutes. The patient's level of consciousness and vital signs were monitored continuously by radiology nursing throughout the procedure under my direct supervision. FLUOROSCOPY TIME:  NONE COMPLICATIONS: NONE PROCEDURE: Informed written consent was obtained from the patient after a thorough discussion of the procedural risks, benefits and alternatives. All questions were addressed. Maximal Sterile Barrier Technique was utilized including caps, mask, sterile gowns, sterile gloves, sterile drape, hand hygiene and skin antiseptic. A timeout was performed prior to the initiation of the procedure. Patient positioned supine position on the ultrasound stretcher. Images were stored of the upper abdomen. Patient is prepped and draped in the usual sterile fashion. 1% lidocaine was used for local anesthesia. Using ultrasound guidance, guide needle was advanced to the liver mass. Multiple 18 gauge core biopsy were achieved. Patient tolerated the  procedure well and remained hemodynamically stable throughout. No complications were encountered and no significant blood loss. IMPRESSION: Status post ultrasound-guided biopsy of liver mass. Tissue specimen sent to pathology for complete histopathologic analysis. Signed, Dulcy Fanny. Earleen Newport, DO [facility] Radiology Electronically Signed   By: Corrie Mckusick D.O.   On: 11/25/2017 09:26   Ir US Guide Vasc Access Right  Result Date: 12/07/2017 INDICATION: History of lymphoma. In need of durable intravenous access for chemotherapy administration. EXAM: IMPLANTED PORT A CATH  PLACEMENT WITH ULTRASOUND AND FLUOROSCOPIC GUIDANCE COMPARISON:  PET-CT - 11/22/2016 MEDICATIONS: Clindamycin 600 mg IV; The antibiotic was administered within an appropriate time interval prior to skin puncture. ANESTHESIA/SEDATION: Moderate (conscious) sedation was employed during this procedure. A total of Versed 1 mg and Fentanyl 75 mcg was administered intravenously. Moderate Sedation Time: 30 minutes. The patient's level of consciousness and vital signs were monitored continuously by radiology nursing throughout the procedure under my direct supervision. CONTRAST:  None FLUOROSCOPY TIME:  54 seconds (5 mGy) COMPLICATIONS: None immediate. PROCEDURE: The procedure, risks, benefits, and alternatives were explained to the patient. Questions regarding the procedure were encouraged and answered. The patient understands and consents to the procedure. The right neck and chest were prepped with chlorhexidine in a sterile fashion, and a sterile drape was applied covering the operative field. Maximum barrier sterile technique with sterile gowns and gloves were used for the procedure. A timeout was performed prior to the initiation of the procedure. Local anesthesia was provided with 1% lidocaine with epinephrine. After creating a small venotomy incision, a micropuncture kit was utilized to access the  internal jugular vein. Real-time ultrasound guidance was utilized for vascular access including the acquisition of a permanent ultrasound image documenting patency of the accessed vessel. The microwire was utilized to measure appropriate catheter length. A subcutaneous port pocket was then created along the upper chest wall utilizing a combination of sharp and blunt dissection. The pocket was irrigated with sterile saline. A single lumen thin power injectable port was chosen for placement. The 8 Fr catheter was tunneled from the port pocket site to the venotomy incision. The port was placed in the pocket. The external catheter was trimmed to appropriate length. At the venotomy, an 8 Fr peel-away sheath was placed over a guidewire under fluoroscopic guidance. The catheter was then placed through the sheath and the sheath was removed. Final catheter positioning was confirmed and documented with a fluoroscopic spot radiograph. The port was accessed with a Huber needle, aspirated and flushed with heparinized saline. The venotomy site was closed with an interrupted 4-0 Vicryl suture. The port pocket incision was closed with interrupted 2-0 Vicryl suture and the skin was opposed with a running subcuticular 4-0 Vicryl suture. Dermabond and Steri-strips were applied to both incisions. Dressings were placed. The patient tolerated the procedure well without immediate post procedural complication. FINDINGS: After catheter placement, the tip lies within the superior cavoatrial junction. The catheter aspirates and flushes normally and is ready for immediate use. IMPRESSION: Successful placement of a right internal jugular approach power injectable Port-A-Cath. The catheter is ready for immediate use. Electronically Signed   By: Sandi Mariscal M.D.   On: 12/07/2017 15:33   Dg Humerus Right  Result Date: 11/23/2017 CLINICAL DATA:  The patient has undergone ORIF for a pathologic fracture through the junction of the proximal and  mid thirds of the shaft of the right humerus. EXAM: RIGHT HUMERUS - 2+ VIEW COMPARISON:  Intraoperative fluoro spot radiographs of today's date FINDINGS: The patient has undergone placement of an intramedullary rod with securing screws for fixation of a fracture at the junction of the proximal and mid thirds of the right humerus. Alignment is now more nearly anatomic. The observed portions of the shoulder and elbow are grossly normal. IMPRESSION: No postprocedure complication following ORIF for a pathologic right humeral shaft fracture. Electronically Signed   By: David  Martinique M.D.   On: 11/23/2017 15:26   Dg Humerus Right  Result Date: 11/23/2017 CLINICAL DATA:  Lymphoma.  Pathologic right humeral fracture. EXAM: DG C-ARM 61-120 MIN; RIGHT HUMERUS - 2+ VIEW COMPARISON:  Radiographs 11/15/2017.  MRI 11/19/2017. FLUOROSCOPY TIME:  2 min and 30 sec. C-arm fluoroscopic images were obtained intraoperatively and submitted for post operative interpretation. Please see the performing provider's procedural report for the fluoroscopy time utilized. FINDINGS: Nine spot fluoroscopic images are submitted. Initial images demonstrate an angulated pathologic fracture of the proximal right humeral diaphysis. Subsequent images demonstrate the placement of a right humeral intramedullary nail secured by 2 proximal and 1 distal interlocking screws. There is near anatomic reduction of the fracture. No complications identified. IMPRESSION: Near anatomic reduction of pathologic proximal right humeral fracture post intramedullary nailing. Electronically Signed   By: Richardean Sale M.D.   On: 11/23/2017 15:06   Dg C-arm 1-60 Min  Result Date: 11/23/2017 CLINICAL DATA:  Lymphoma.  Pathologic right humeral fracture. EXAM: DG C-ARM 61-120 MIN; RIGHT HUMERUS - 2+ VIEW COMPARISON:  Radiographs 11/15/2017.  MRI 11/19/2017. FLUOROSCOPY TIME:  2 min and 30 sec. C-arm fluoroscopic images were obtained intraoperatively and submitted for  post operative interpretation. Please see the performing provider's procedural report for the fluoroscopy time utilized. FINDINGS: Nine spot fluoroscopic images are submitted. Initial images demonstrate an angulated pathologic fracture of the proximal right humeral diaphysis. Subsequent images demonstrate the placement of a right humeral intramedullary nail secured by 2 proximal and 1 distal interlocking screws. There is near anatomic reduction of the fracture. No complications identified. IMPRESSION: Near anatomic reduction of pathologic proximal right humeral fracture post intramedullary nailing. Electronically Signed   By: Richardean Sale M.D.   On: 11/23/2017 15:06   Ir Fluoro Guide Port Insertion Right  Result Date: 12/07/2017 INDICATION: History of lymphoma. In need of durable intravenous access for chemotherapy administration. EXAM: IMPLANTED PORT A CATH PLACEMENT WITH ULTRASOUND AND FLUOROSCOPIC GUIDANCE COMPARISON:  PET-CT - 11/22/2016 MEDICATIONS: Clindamycin 600 mg IV; The antibiotic was administered within an appropriate time interval prior to skin puncture. ANESTHESIA/SEDATION: Moderate (conscious) sedation was employed during this procedure. A total of Versed 1 mg and Fentanyl 75 mcg was administered intravenously. Moderate Sedation Time: 30 minutes. The patient's level of consciousness and vital signs were monitored continuously by radiology nursing throughout the procedure under my direct supervision. CONTRAST:  None FLUOROSCOPY TIME:  54 seconds (5 mGy) COMPLICATIONS: None immediate. PROCEDURE: The procedure, risks, benefits, and alternatives were explained to the patient. Questions regarding the procedure were encouraged and answered. The patient understands and consents to the procedure. The right neck and chest were prepped with chlorhexidine in a sterile fashion, and a sterile drape was applied covering the operative field. Maximum barrier sterile technique with sterile gowns and gloves were  used for the procedure. A timeout was performed prior to the initiation of the procedure. Local anesthesia was provided with 1% lidocaine with epinephrine. After creating a small venotomy incision, a micropuncture kit was utilized to access the internal jugular vein. Real-time ultrasound guidance was utilized for vascular access including the acquisition of a permanent ultrasound image documenting patency of the accessed vessel. The microwire was utilized to measure appropriate catheter length. A subcutaneous port pocket was then created along the upper chest wall utilizing a combination of sharp and blunt dissection. The pocket was irrigated with sterile saline. A single lumen thin power injectable port was chosen for placement. The 8 Fr catheter was tunneled from the port pocket site to the venotomy incision. The port was placed in the pocket. The external catheter was trimmed to  appropriate length. At the venotomy, an 8 Fr peel-away sheath was placed over a guidewire under fluoroscopic guidance. The catheter was then placed through the sheath and the sheath was removed. Final catheter positioning was confirmed and documented with a fluoroscopic spot radiograph. The port was accessed with a Huber needle, aspirated and flushed with heparinized saline. The venotomy site was closed with an interrupted 4-0 Vicryl suture. The port pocket incision was closed with interrupted 2-0 Vicryl suture and the skin was opposed with a running subcuticular 4-0 Vicryl suture. Dermabond and Steri-strips were applied to both incisions. Dressings were placed. The patient tolerated the procedure well without immediate post procedural complication. FINDINGS: After catheter placement, the tip lies within the superior cavoatrial junction. The catheter aspirates and flushes normally and is ready for immediate use. IMPRESSION: Successful placement of a right internal jugular approach power injectable Port-A-Cath. The catheter is ready for  immediate use. Electronically Signed   By: Sandi Mariscal M.D.   On: 12/07/2017 15:33    All questions were answered. The patient knows to call the clinic with any problems, questions or concerns. No barriers to learning was detected.  I spent 25 minutes counseling the patient face to face. The total time spent in the appointment was 30 minutes and more than 50% was on counseling and review of test results  [name], MD 12/15/2017 5:01 PM

## 2017-12-15 NOTE — Assessment & Plan Note (Signed)
She has nonproductive cough clinically, I do not believe she has active infection.  However, given recent chemotherapy and her age, the patient is immunocompromised I recommend prescription levofloxacin to hang onto If her cough gets worse or the patient starts to develop fever, she has my permission to take prescription antibiotics and then to call me the next business day for further evaluation

## 2017-12-15 NOTE — Assessment & Plan Note (Signed)
She has signs and symptoms of mucositis pain I recommend Magic mouthwash swish and swallow

## 2017-12-15 NOTE — Telephone Encounter (Signed)
Called husband regarding mychart message today. He had just read the message on mychart from Dr. Alvy Bimler. Brooke Washington would like to come in today and see Dr. Alvy Bimler.

## 2017-12-17 ENCOUNTER — Encounter: Payer: Self-pay | Admitting: Hematology and Oncology

## 2017-12-17 ENCOUNTER — Emergency Department (HOSPITAL_COMMUNITY): Payer: Medicare Other

## 2017-12-17 ENCOUNTER — Encounter (HOSPITAL_COMMUNITY): Payer: Self-pay | Admitting: Nurse Practitioner

## 2017-12-17 ENCOUNTER — Inpatient Hospital Stay (HOSPITAL_COMMUNITY)
Admission: EM | Admit: 2017-12-17 | Discharge: 2017-12-20 | DRG: 809 | Disposition: A | Discharge status: Home Health | Payer: Medicare Other | Attending: Internal Medicine | Admitting: Internal Medicine

## 2017-12-17 DIAGNOSIS — R531 Weakness: Secondary | ICD-10-CM | POA: Diagnosis not present

## 2017-12-17 DIAGNOSIS — D701 Agranulocytosis secondary to cancer chemotherapy: Principal | ICD-10-CM | POA: Diagnosis present

## 2017-12-17 DIAGNOSIS — C8338 Diffuse large B-cell lymphoma, lymph nodes of multiple sites: Secondary | ICD-10-CM | POA: Diagnosis present

## 2017-12-17 DIAGNOSIS — R339 Retention of urine, unspecified: Secondary | ICD-10-CM | POA: Diagnosis present

## 2017-12-17 DIAGNOSIS — R404 Transient alteration of awareness: Secondary | ICD-10-CM | POA: Diagnosis not present

## 2017-12-17 DIAGNOSIS — E785 Hyperlipidemia, unspecified: Secondary | ICD-10-CM | POA: Diagnosis present

## 2017-12-17 DIAGNOSIS — J439 Emphysema, unspecified: Secondary | ICD-10-CM | POA: Diagnosis present

## 2017-12-17 DIAGNOSIS — Z800 Family history of malignant neoplasm of digestive organs: Secondary | ICD-10-CM

## 2017-12-17 DIAGNOSIS — R0902 Hypoxemia: Secondary | ICD-10-CM | POA: Diagnosis not present

## 2017-12-17 DIAGNOSIS — Z7982 Long term (current) use of aspirin: Secondary | ICD-10-CM

## 2017-12-17 DIAGNOSIS — D696 Thrombocytopenia, unspecified: Secondary | ICD-10-CM | POA: Diagnosis not present

## 2017-12-17 DIAGNOSIS — G9341 Metabolic encephalopathy: Secondary | ICD-10-CM | POA: Diagnosis not present

## 2017-12-17 DIAGNOSIS — R338 Other retention of urine: Secondary | ICD-10-CM | POA: Diagnosis not present

## 2017-12-17 DIAGNOSIS — Z79899 Other long term (current) drug therapy: Secondary | ICD-10-CM

## 2017-12-17 DIAGNOSIS — J449 Chronic obstructive pulmonary disease, unspecified: Secondary | ICD-10-CM | POA: Diagnosis present

## 2017-12-17 DIAGNOSIS — E860 Dehydration: Secondary | ICD-10-CM | POA: Diagnosis present

## 2017-12-17 DIAGNOSIS — Z79891 Long term (current) use of opiate analgesic: Secondary | ICD-10-CM

## 2017-12-17 DIAGNOSIS — T451X5A Adverse effect of antineoplastic and immunosuppressive drugs, initial encounter: Secondary | ICD-10-CM | POA: Diagnosis not present

## 2017-12-17 DIAGNOSIS — K5900 Constipation, unspecified: Secondary | ICD-10-CM | POA: Diagnosis present

## 2017-12-17 DIAGNOSIS — G9340 Encephalopathy, unspecified: Secondary | ICD-10-CM | POA: Diagnosis not present

## 2017-12-17 DIAGNOSIS — Z7952 Long term (current) use of systemic steroids: Secondary | ICD-10-CM

## 2017-12-17 DIAGNOSIS — R198 Other specified symptoms and signs involving the digestive system and abdomen: Secondary | ICD-10-CM | POA: Diagnosis not present

## 2017-12-17 DIAGNOSIS — R05 Cough: Secondary | ICD-10-CM | POA: Diagnosis not present

## 2017-12-17 DIAGNOSIS — K5909 Other constipation: Secondary | ICD-10-CM

## 2017-12-17 DIAGNOSIS — C8588 Other specified types of non-Hodgkin lymphoma, lymph nodes of multiple sites: Secondary | ICD-10-CM | POA: Diagnosis not present

## 2017-12-17 DIAGNOSIS — J101 Influenza due to other identified influenza virus with other respiratory manifestations: Secondary | ICD-10-CM | POA: Diagnosis present

## 2017-12-17 DIAGNOSIS — Z91041 Radiographic dye allergy status: Secondary | ICD-10-CM

## 2017-12-17 DIAGNOSIS — Z66 Do not resuscitate: Secondary | ICD-10-CM | POA: Diagnosis present

## 2017-12-17 DIAGNOSIS — R5081 Fever presenting with conditions classified elsewhere: Secondary | ICD-10-CM

## 2017-12-17 DIAGNOSIS — Z791 Long term (current) use of non-steroidal anti-inflammatories (NSAID): Secondary | ICD-10-CM

## 2017-12-17 DIAGNOSIS — D709 Neutropenia, unspecified: Secondary | ICD-10-CM | POA: Diagnosis present

## 2017-12-17 DIAGNOSIS — E440 Moderate protein-calorie malnutrition: Secondary | ICD-10-CM | POA: Diagnosis not present

## 2017-12-17 DIAGNOSIS — K30 Functional dyspepsia: Secondary | ICD-10-CM | POA: Diagnosis not present

## 2017-12-17 DIAGNOSIS — Y92009 Unspecified place in unspecified non-institutional (private) residence as the place of occurrence of the external cause: Secondary | ICD-10-CM

## 2017-12-17 DIAGNOSIS — K219 Gastro-esophageal reflux disease without esophagitis: Secondary | ICD-10-CM | POA: Diagnosis present

## 2017-12-17 DIAGNOSIS — Z880 Allergy status to penicillin: Secondary | ICD-10-CM

## 2017-12-17 DIAGNOSIS — Z87891 Personal history of nicotine dependence: Secondary | ICD-10-CM

## 2017-12-17 DIAGNOSIS — Z90721 Acquired absence of ovaries, unilateral: Secondary | ICD-10-CM

## 2017-12-17 DIAGNOSIS — Z6821 Body mass index (BMI) 21.0-21.9, adult: Secondary | ICD-10-CM

## 2017-12-17 DIAGNOSIS — Z91048 Other nonmedicinal substance allergy status: Secondary | ICD-10-CM

## 2017-12-17 LAB — URINALYSIS, ROUTINE W REFLEX MICROSCOPIC
Bilirubin Urine: NEGATIVE
Glucose, UA: NEGATIVE mg/dL
Hgb urine dipstick: NEGATIVE
Ketones, ur: NEGATIVE mg/dL
Leukocytes, UA: NEGATIVE
Nitrite: NEGATIVE
PH: 7 (ref 5.0–8.0)
Protein, ur: 30 mg/dL — AB
SPECIFIC GRAVITY, URINE: 1.017 (ref 1.005–1.030)

## 2017-12-17 LAB — BLOOD GAS, ARTERIAL
Acid-Base Excess: 3.7 mmol/L — ABNORMAL HIGH (ref 0.0–2.0)
Bicarbonate: 26.8 mmol/L (ref 20.0–28.0)
Drawn by: 11249
O2 Content: 4 L/min
O2 Saturation: 94.6 %
PCO2 ART: 36 mmHg (ref 32.0–48.0)
PO2 ART: 73.6 mmHg — AB (ref 83.0–108.0)
Patient temperature: 98.8
pH, Arterial: 7.485 — ABNORMAL HIGH (ref 7.350–7.450)

## 2017-12-17 LAB — COMPREHENSIVE METABOLIC PANEL
ALT: 13 U/L — AB (ref 14–54)
AST: 16 U/L (ref 15–41)
Albumin: 3.7 g/dL (ref 3.5–5.0)
Alkaline Phosphatase: 128 U/L — ABNORMAL HIGH (ref 38–126)
Anion gap: 10 (ref 5–15)
BILIRUBIN TOTAL: 0.3 mg/dL (ref 0.3–1.2)
BUN: 14 mg/dL (ref 6–20)
CHLORIDE: 99 mmol/L — AB (ref 101–111)
CO2: 29 mmol/L (ref 22–32)
CREATININE: 0.72 mg/dL (ref 0.44–1.00)
Calcium: 9.3 mg/dL (ref 8.9–10.3)
GFR calc Af Amer: >60 mL/min (ref >60)
GLUCOSE: 117 mg/dL — AB (ref 65–99)
Potassium: 4 mmol/L (ref 3.5–5.1)
Sodium: 138 mmol/L (ref 135–145)
Total Protein: 6.5 g/dL (ref 6.5–8.1)

## 2017-12-17 LAB — CBC WITH DIFFERENTIAL/PLATELET
BASOS PCT: 0 %
Basophils Absolute: 0 10*3/uL (ref 0.0–0.1)
EOS PCT: 18 %
Eosinophils Absolute: 0 10*3/uL (ref 0.0–0.7)
HCT: 43.3 % (ref 36.0–46.0)
Hemoglobin: 14 g/dL (ref 12.0–15.0)
LYMPHS ABS: 0 10*3/uL — AB (ref 0.7–4.0)
Lymphocytes Relative: 46 %
MCH: 32.1 pg (ref 26.0–34.0)
MCHC: 32.3 g/dL (ref 30.0–36.0)
MCV: 99.3 fL (ref 78.0–100.0)
MONO ABS: 0 10*3/uL — AB (ref 0.1–1.0)
Monocytes Relative: 18 %
NEUTROS ABS: 0 10*3/uL — AB (ref 1.7–7.7)
Neutrophils Relative %: 18 %
PLATELETS: 57 10*3/uL — AB (ref 150–400)
RBC: 4.36 MIL/uL (ref 3.87–5.11)
RDW: 14.1 % (ref 11.5–15.5)
WBC: 0.1 10*3/uL — AB (ref 4.0–10.5)

## 2017-12-17 MED ORDER — SODIUM CHLORIDE 0.9 % IV BOLUS (SEPSIS)
500.0000 mL | Freq: Once | INTRAVENOUS | Status: AC
  Administered 2017-12-17: 500 mL via INTRAVENOUS

## 2017-12-17 MED ORDER — SODIUM CHLORIDE 0.9 % IV SOLN
INTRAVENOUS | Status: DC
  Administered 2017-12-17: 23:00:00 via INTRAVENOUS

## 2017-12-17 MED ORDER — SODIUM CHLORIDE 0.9 % IV SOLN
2.0000 g | Freq: Once | INTRAVENOUS | Status: AC
  Administered 2017-12-18: 2 g via INTRAVENOUS
  Filled 2017-12-17: qty 2

## 2017-12-17 NOTE — ED Notes (Signed)
Date and time results received: 12/17/17 2302 (use smartphrase ".now" to insert current time)  Test: WBC Critical Value: 0.1  Name of Provider Notified: Eulis Foster  Orders Received? Or Actions Taken?: none

## 2017-12-17 NOTE — H&P (Signed)
Brooke Washington OZD:[id number] DOB: 1941/05/09 DOA: 12/17/2017     PCP: [name], NP   Outpatient Specialists: Oncology [name]  Patient coming from:   home Lives With family    Chief Complaint: Fever and confusion  HPI: Brooke Washington is a 77 y.o. 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 She has not been feeling well since this morning reportedly somewhat confused since 5 pm having trouble going to the bathroom is not her normal usually she is able to ambulate at home her oxygen saturation was noted to be 87% room air which is not her normal she is not on oxygen at home.  No sick contacts at home. Reports burning epigastric pain for the past 1 week.  Recently reported nonproductive cough has been evaluated by her oncologist who felt that although infectious process less likely given immunocompromise state prescribed levofloxacin if she develops fevers. No dysuria but have not been able to empty bladder completely urinating only small amount at the time.  Reports wheezing. states not improved with steroids . patiet at this point. Does not wish to have any further steroids stating it does not help and prevent her from sleeping.   No BM today.   Patient with follicular lymphoma diagnosed in 2000 treated initially with chemotherapy in 2009 treated with Rituxan. Course complicated by humeral fracture secondary to bone marrow involvement with lymphoma.  Last PET scan done in 22 November 2017 showing progression of lymphoma recurrence and involvement of liver kidney and right humerus. Right humerus was repaired in February Last echogram done in February showed preserved EF.  Vascular chemotherapy infusion was on 1 March (Adriamycin, Vincristine, Cytoxan, and Rituxan) followed by PEGFILGRASTIM on 4 March.    In ER bladder scan  had 330 residual, foley ordred  Significant initial  Findings: AG 7.485/73.6 WBC 0.1 ABN 0.0  plt 57 Cr 0.72 CXR non acute  ER provider discussed case with:  Ennever Who recommends: admit to medicine  We'll see patient in consult in the morning     IN ER:  No data recorded.      on arrival  ED Triage Vitals  Enc Vitals Group     BP 12/17/17 2205 (!) 153/81     Pulse Rate 12/17/17 2205 (!) 119     Resp 12/17/17 2205 12     Temp --      Temp src --      SpO2 12/17/17 2205 97 %     Weight --      Height --      Head Circumference --      Peak Flow --      Pain Score 12/17/17 2139 0     Pain Loc --      Pain Edu? --      Excl. in [place]? --     Latest RR 12 97% HR 119 BP 153/81  Following Medications were ordered in ER: Medications  0.9 %  sodium chloride infusion ( Intravenous New Bag/Given 12/17/17 2230)  ceFEPIme (MAXIPIME) 2 g in sodium chloride 0.9 % 100 mL IVPB (not administered)  sodium chloride 0.9 % bolus 500 mL (500 mLs Intravenous New Bag/Given 12/17/17 2230)   Hospitalist was called for admission for neutropenic fever    Review of Systems:    Pertinent positives include: Fevers, chills, fatigue,confusion abdominal pain,productive  cough, urinary frequency.  Constitutional:  No weight loss, night sweats,  weight loss  HEENT:  No headaches, Difficulty swallowing,Tooth/dental problems,Sore throat,  No sneezing, itching, ear ache, nasal congestion, post nasal drip,  Cardio-vascular:  No chest pain, Orthopnea, PND, anasarca, dizziness, palpitations.no Bilateral lower extremity swelling  GI:  No heartburn, indigestion,  nausea, vomiting, diarrhea, change in bowel habits, loss of appetite, melena, blood in stool, hematemesis Resp:  no shortness of breath at rest. No dyspnea on exertion, No excess mucus, no , No non-productive cough, No coughing up of blood.No change in color of mucus.No wheezing. Skin:  no rash or lesions. No jaundice GU:  no dysuria, change in  color of urine, no urgency or   .  No flank pain.  Musculoskeletal:  No joint pain or no joint swelling. No decreased range of motion. No back pain.  Psych:  No change in mood or affect. No depression or anxiety. No memory loss.  Neuro: no localizing neurological complaints, no tingling, no weakness, no double vision, no gait abnormality, no slurred speech, no  As per HPI otherwise 10 point review of systems negative.   Past Medical History: Past Medical History:  Diagnosis Date  . Cancer ([place])  APRIL OF 2000   LOW-GRADE NON-HODGKIN'S LYMPHOMA  . COPD (chronic obstructive pulmonary disease) ([place])   . Emphysema of lung ([place])   . Hyperlipidemia    Past Surgical History:  Procedure Laterality Date  . 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 IR FLUORO GUIDE PORT INSERTION RIGHT  12/07/2017  . IR US GUIDE VASC ACCESS RIGHT  12/07/2017  . ORIF HUMERUS FRACTURE Right 11/23/2017   Procedure: OPEN REDUCTION INTERNAL FIXATION (ORIF) HUMERAL SHAFT FRACTURE;  Surgeon: Shona Needles, MD;  Location: [facility];  Service: Orthopedics;  Laterality: Right;  . RIGHT OOPHORECTOMY  1976     Social History:  Ambulatory   independently      reports that she quit smoking about 2 years ago. Her smoking use included cigarettes. She has a 12.75 pack-year smoking history. she has never used smokeless tobacco. She reports that she drinks alcohol. She reports that she does not use drugs.  Allergies:   Allergies  Allergen Reactions  . Contrast Media [Iodinated Diagnostic Agents] Shortness Of Breath, Rash and Other (See Comments)    Throat swells  . Iohexol Shortness Of Breath  . Povidone-Iodine Hives, Shortness Of Breath and Rash    REACTION: throat swells  . Penicillins Hives, Itching and Rash    Has patient had a PCN reaction causing immediate rash, facial/tongue/throat swelling, SOB or lightheadedness with hypotension: Unknown Has  patient had a PCN reaction causing severe rash involving mucus membranes or skin necrosis: Yes Has patient had a PCN reaction that required hospitalization: Unknown Has patient had a PCN reaction occurring within the last 10 years: Unknown If all of the above answers are "NO", then may proceed with Cephalosporin use.   . Adhesive [Tape] Hives    Family History:   Family History  Problem Relation Age of Onset  . Cancer Mother        colon ca    Medications: Prior to Admission medications   Medication Sig Start Date End Date Taking? Authorizing Provider  allopurinol (ZYLOPRIM) 300 MG tablet Take 1 tablet (300 mg total) by mouth daily. 11/26/17   Marene Lenz, MD  allopurinol (ZYLOPRIM) 300 MG tablet Take 1 tablet (300 mg  total) by mouth daily. 11/29/17   [name], MD  aspirin 81 MG tablet Take 81 mg by mouth daily.    [provider]  citalopram (CELEXA) 40 MG tablet Take 1 tablet (40 mg total) by mouth daily. 05/17/17   Nafziger, Tommi Rumps, NP  fluticasone furoate-vilanterol (BREO ELLIPTA) 100-25 MCG/INH AEPB Inhale 1 puff into the lungs every morning. 07/05/17   Tanda Rockers, MD  HYDROcodone-acetaminophen (NORCO/VICODIN) 5-325 MG tablet Take 1 tablet by mouth every 4 (four) hours as needed for moderate pain. 11/21/17   [name], MD  ibuprofen (ADVIL,MOTRIN) 200 MG tablet Take 200 mg by mouth every 6 (six) hours as needed for moderate pain.    [provider]  levofloxacin (LEVAQUIN) 500 MG tablet Take 1 tablet (500 mg total) by mouth daily. 12/15/17   [name], MD  lidocaine-prilocaine (EMLA) cream Apply to affected area once 11/29/17   [name], MD  magic mouthwash w/lidocaine SOLN Take 10 mLs by mouth 4 (four) times daily. Swish and swallow 10 ml QID 12/15/17   [name], MD  ondansetron (ZOFRAN) 8 MG tablet Take 1 tablet (8 mg total) by mouth every 8 (eight) hours as needed for nausea. 11/21/17   [name], MD  pantoprazole (PROTONIX) 40 MG tablet Take 1  tablet (40 mg total) by mouth daily. 10/12/17   Nafziger, Tommi Rumps, NP  predniSONE (DELTASONE) 20 MG tablet Take 2 tablets (40 mg total) by mouth daily. Start 40 mg daily on 2/23. After chemo, take 40 mg for 4 days 11/29/17   [name], MD  prochlorperazine (COMPAZINE) 10 MG tablet Take 1 tablet (10 mg total) by mouth every 6 (six) hours as needed (Nausea or vomiting). 11/29/17   [name], MD  senna (SENOKOT) 8.6 MG TABS tablet Take 2 tablets (17.2 mg total) by mouth daily as needed for mild constipation. 11/25/17   Marene Lenz, MD    Physical Exam: Patient Vitals for the past 24 hrs:  BP Pulse Resp SpO2  12/17/17 2205 (!) 153/81 (!) 119 12 97 %    1. General:  in No Acute distress  Chronically ill   -appearing 2. Psychological: Alert but not Oriented 3. Head/ENT:     Dry Mucous Membranes                          Head Non traumatic, neck supple                            Poor Dentition 4. SKIN:   decreased Skin turgor,  Skin clean Dry and intact no rash 5. Heart: Regular rate and rhythm no  Murmur, no Rub or gallop 6. Lungs:   no wheezes or crackles   7. Abdomen: Soft,  non-tender,   suprapubical distention,  bowel sounds present 8. Lower extremities: no clubbing, cyanosis, or edema 9. Neurologically Grossly intact, moving all 4 extremities equally   10. MSK: Normal range of motion Port with out evidence of infection  body mass index is unknown because there is no height or weight on file.  Labs on Admission:   Labs on Admission: I have personally reviewed following labs and imaging studies  CBC: Recent Labs  Lab 12/17/17 2202  WBC 0.1*  NEUTROABS 0.0*  HGB 14.0  HCT 43.3  MCV 99.3  PLT 57*   Basic Metabolic Panel: Recent Labs  Lab 12/17/17 2202  NA 138  K 4.0  CL 99*  CO2 29  GLUCOSE 117*  BUN 14  CREATININE 0.72  CALCIUM 9.3   GFR: Estimated Creatinine Clearance: 50.9 mL/min (by C-G formula based on SCr of 0.72 mg/dL). Liver Function Tests: Recent  Labs  Lab 12/17/17 2202  AST 16  ALT 13*  ALKPHOS 128*  BILITOT 0.3  PROT 6.5  ALBUMIN 3.7   No results for input(s): LIPASE, AMYLASE in the last 168 hours. No results for input(s): AMMONIA in the last 168 hours. Coagulation Profile: No results for input(s): INR, PROTIME in the last 168 hours. Cardiac Enzymes: No results for input(s): CKTOTAL, CKMB, CKMBINDEX, TROPONINI in the last 168 hours. BNP (last 3 results) No results for input(s): PROBNP in the last 8760 hours. HbA1C: No results for input(s): HGBA1C in the last 72 hours. CBG: No results for input(s): GLUCAP in the last 168 hours. Lipid Profile: No results for input(s): CHOL, HDL, LDLCALC, TRIG, CHOLHDL, LDLDIRECT in the last 72 hours. Thyroid Function Tests: No results for input(s): TSH, T4TOTAL, FREET4, T3FREE, THYROIDAB in the last 72 hours. Anemia Panel: No results for input(s): VITAMINB12, FOLATE, FERRITIN, TIBC, IRON, RETICCTPCT in the last 72 hours. Urine analysis:    Component Value Date/Time   COLORURINE YELLOW 12/17/2017 2213   APPEARANCEUR CLEAR 12/17/2017 2213   LABSPEC 1.017 12/17/2017 2213   PHURINE 7.0 12/17/2017 2213   GLUCOSEU NEGATIVE 12/17/2017 2213   HGBUR NEGATIVE 12/17/2017 2213   BILIRUBINUR NEGATIVE 12/17/2017 2213   BILIRUBINUR n 04/30/2016 1055   KETONESUR NEGATIVE 12/17/2017 2213   PROTEINUR 30 (A) 12/17/2017 2213   UROBILINOGEN 0.2 04/30/2016 1055   UROBILINOGEN 0.2 01/28/2009 1035   NITRITE NEGATIVE 12/17/2017 2213   LEUKOCYTESUR NEGATIVE 12/17/2017 2213   Sepsis Labs: @LABRCNTIP (procalcitonin:4,lacticidven:4) )No results found for this or any previous visit (from the past 240 hour(s)).     UA  no evidence of UTI   No results found for: HGBA1C  Estimated Creatinine Clearance: 50.9 mL/min (by C-G formula based on SCr of 0.72 mg/dL).  BNP (last 3 results) No results for input(s): PROBNP in the last 8760 hours.   ECG REPORT Not ordered  There were no vitals filed for this  visit.   Cultures: No results found for: SDES, [place], CULT, REPTSTATUS   Radiological Exams on Admission: Dg Chest 2 View  Result Date: 12/17/2017 CLINICAL DATA:  Acute onset of generalized weakness and lethargy. Mild hypoxia. Productive cough and malaise. EXAM: CHEST - 2 VIEW COMPARISON:  Chest radiograph performed 05/17/2017 FINDINGS: The lungs are well-aerated and clear. There is no evidence of focal opacification, pleural effusion or pneumothorax. The heart is normal in size; the mediastinal contour is within normal limits. No acute osseous abnormalities are seen. Right humeral hardware is incompletely imaged but appears grossly unremarkable, transfixing the right humeral diaphyseal fracture. A right-sided chest port is noted ending about the mid to distal SVC. IMPRESSION: No acute cardiopulmonary process seen. Electronically Signed   By: Garald Balding M.D.   On: 12/17/2017 22:30    Chart has been reviewed    Assessment/Plan  77 y.o. 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  [place]) - appreciate oncology consult will see pt in AM .   . Neutropenic fever ([place]) - admit for IV antibiotics, await result of blood and urine culture. neutropenic precautions, no evidence of sepsis, PEGFILGRASTIM  Already given will not redose.    . Thrombocytopenia ([place]) - likely due to recent chemo. No bleeding . Abdominal fullness - multifactorial due to urinary retention and obstipation. Given hx of Lymphoma may need further imaging if not resolved to RO obstruction.  . Acute urinary retention - ordered foley, reattempt  urinary trial in AM if unable to void may benefit from urology consult. Obstipation  - no PR medication given neutropenia, PO bowel regimen as able to tolerate.   Marland Kitchen COPD GOLD II possibly with exacerbation - patient at this point would like ot avoid steroids, will give a trial of nebulizer and Dulera  for tonight. If worsens or no improvement would need to readdress steroid use.    Other plan as per orders.  DVT prophylaxis:  SCD    Code Status:   DNR/DNI   as per patient   Family Communication:   Family   at  Bedside  plan of care was discussed with  Daughter, Husband,  Disposition Plan:      To home once workup is complete and patient is stable                        Would benefit from PT/OT eval prior to DC  ordered             Consults called: Oncology  Admission status:  inpatient      Level of care     medical floor            I have spent a total of 76 min on this admission   Olusegun Gerstenberger 12/18/2017, 3:30 AM    [facility]  Pager [phone]   after 2 AM please page floor coverage PA If 7AM-7PM, please contact the day team taking care of the patient  [email or web address]  Password [email or web address]

## 2017-12-17 NOTE — Progress Notes (Signed)
A consult was received from an ED physician for cefepime per pharmacy dosing.  The patient's profile has been reviewed for ht/wt/allergies/indication/available labs.    A one time order has been placed for cefepime 2gm IV x1. Of note, patient has PCN allergy but tolerated ancef in the past  Further antibiotics/pharmacy consults should be ordered by admitting physician if indicated.                       Thank you, Lynelle Doctor 12/17/2017  11:47 PM

## 2017-12-17 NOTE — ED Notes (Signed)
Bed: HA19 Expected date:  Expected time:  Means of arrival:  Comments: 77 yo F/Chemo Fever

## 2017-12-17 NOTE — ED Provider Notes (Signed)
[facility] DEPT Provider Note   CSN: [id number] Arrival date & time: 12/17/17  2129     History   Chief Complaint Chief Complaint  Patient presents with  . Weakness  . CA Pt    HPI Brooke Washington is a 77 y.o. female.  Patient is here for general malaise, and worsening confusion with suspected weakness.  Symptoms gradual onset since new chemotherapy started with infusion, 8 days ago.  Earlier today at 39 AM her husband contacted her oncologist stating that she was doing fairly well.  Since that time she has become confused, and occasionally responding to questions with "a blank stare."  This evening she had trouble getting to the bathroom without help.  The confusion and weakness are new for the patient.  This evening she had a temperature of 100.3, orally.  She also has ongoing mid abdominal pain, described as a "hot feeling."  She takes hydrocodone for abdominal pain.  Earlier today at home, her oxygen saturation was 87%, on room air.  She is not on oxygen supplementation at home.  She is being treated for recurrent lymphoma, with new treatment started 8 days ago.  Patient cannot give any history.  Level 5 caveat-confusion   HPI obtained from husband, at bedside      Past Medical History:  Diagnosis Date  . Cancer ([place])  APRIL OF 2000   LOW-GRADE NON-HODGKIN'S LYMPHOMA  . COPD (chronic obstructive pulmonary disease) ([place])   . Emphysema of lung ([place])   . Hyperlipidemia     Patient Active Problem List   Diagnosis Date Noted  . Cough in adult 12/15/2017  . Mucositis due to chemotherapy 12/15/2017  . Heart burn 12/15/2017  . Frequent headaches 12/15/2017  . Chemotherapy-induced nausea 12/12/2017  . 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 Goals of care, counseling/discussion 11/29/2017  . Pathological fracture in neoplastic disease, right humerus, initial encounter for fracture 11/24/2017  . Cancer associated pain 11/22/2017  .  Pathological fracture due to neoplastic disease, initial encounter 11/21/2017  . COPD GOLD II 07/05/2017  . COLONIC POLYPS, HYPERPLASTIC, HX OF 05/28/2010  . 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 Depression 05/05/2007    Past Surgical History:  Procedure Laterality Date  . 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 IR FLUORO GUIDE PORT INSERTION RIGHT  12/07/2017  . IR US GUIDE VASC ACCESS RIGHT  12/07/2017  . ORIF HUMERUS FRACTURE Right 11/23/2017   Procedure: OPEN REDUCTION INTERNAL FIXATION (ORIF) HUMERAL SHAFT FRACTURE;  Surgeon: Shona Needles, MD;  Location: [facility];  Service: Orthopedics;  Laterality: Right;  . RIGHT OOPHORECTOMY  1976    OB History    No data available       Home Medications    Prior to Admission medications   Medication Sig Start Date End Date Taking? Authorizing Provider  allopurinol (ZYLOPRIM) 300 MG tablet Take 1 tablet (300 mg total) by mouth daily. 11/26/17   Marene Lenz, MD  allopurinol (ZYLOPRIM) 300 MG tablet Take 1 tablet (300 mg total) by mouth daily. 11/29/17   [name], MD  aspirin 81 MG tablet Take 81 mg by mouth daily.    [provider]  citalopram (CELEXA) 40 MG tablet Take 1 tablet (40 mg total) by mouth daily. 05/17/17   Nafziger, Tommi Rumps, NP  fluticasone furoate-vilanterol (BREO ELLIPTA) 100-25 MCG/INH AEPB Inhale 1 puff into the  lungs every morning. 07/05/17   Tanda Rockers, MD  HYDROcodone-acetaminophen (NORCO/VICODIN) 5-325 MG tablet Take 1 tablet by mouth every 4 (four) hours as needed for moderate pain. 11/21/17   [name], MD  ibuprofen (ADVIL,MOTRIN) 200 MG tablet Take 200 mg by mouth every 6 (six) hours as needed for moderate pain.    [provider]  levofloxacin (LEVAQUIN) 500 MG tablet Take 1 tablet (500 mg total) by mouth daily. 12/15/17   [name], MD  lidocaine-prilocaine (EMLA) cream Apply to affected area once  11/29/17   [name], MD  magic mouthwash w/lidocaine SOLN Take 10 mLs by mouth 4 (four) times daily. Swish and swallow 10 ml QID 12/15/17   [name], MD  ondansetron (ZOFRAN) 8 MG tablet Take 1 tablet (8 mg total) by mouth every 8 (eight) hours as needed for nausea. 11/21/17   [name], MD  pantoprazole (PROTONIX) 40 MG tablet Take 1 tablet (40 mg total) by mouth daily. 10/12/17   Nafziger, Tommi Rumps, NP  predniSONE (DELTASONE) 20 MG tablet Take 2 tablets (40 mg total) by mouth daily. Start 40 mg daily on 2/23. After chemo, take 40 mg for 4 days 11/29/17   [name], MD  prochlorperazine (COMPAZINE) 10 MG tablet Take 1 tablet (10 mg total) by mouth every 6 (six) hours as needed (Nausea or vomiting). 11/29/17   [name], MD  senna (SENOKOT) 8.6 MG TABS tablet Take 2 tablets (17.2 mg total) by mouth daily as needed for mild constipation. 11/25/17   Marene Lenz, MD    Family History Family History  Problem Relation Age of Onset  . Cancer Mother        colon ca    Social History Social History   Tobacco Use  . Smoking status: Former Smoker    Packs/day: 0.25    Years: 51.00    Pack years: 12.75    Types: Cigarettes    Last attempt to quit: 03/12/2015    Years since quitting: 2.7  . Smokeless tobacco: Never Used  Substance Use Topics  . Alcohol use: Yes    Alcohol/week: 0.0 oz    Comment: 1 glass of wine monthly  . Drug use: No     Allergies   Contrast media [iodinated diagnostic agents]; Iohexol; Povidone-iodine; Penicillins; and Adhesive [tape]   Review of Systems Review of Systems  Unable to perform ROS: Mental status change     Physical Exam Updated Vital Signs BP (!) 153/81 (BP Location: Left Arm)   Pulse (!) 119   Resp 12   SpO2 97%   Physical Exam  Constitutional: She is oriented to person, place, and time. She appears well-developed. No distress.  Elderly, frail  HENT:  Head: Normocephalic and atraumatic.  Eyes: Conjunctivae and EOM are normal. Pupils  are equal, round, and reactive to light.  Neck: Normal range of motion and phonation normal. Neck supple.  No stridor.  Cardiovascular: Normal rate and regular rhythm.  Pulmonary/Chest: Effort normal. She exhibits no tenderness.  Hypoxia, oxygenation 87% on room air.  Decreased breath sounds left mid and left lower, with scattered rhonchi.  No wheezing.  Abdominal: Soft. She exhibits no distension. There is no tenderness. There is no guarding.  Musculoskeletal: Normal range of motion.  Neurological: She is alert and oriented to person, place, and time. She exhibits normal muscle tone.  Skin: Skin is warm and dry.  Psychiatric: She has a normal mood and affect. Her behavior is normal. Judgment and thought  content normal.  Nursing note and vitals reviewed.    ED Treatments / Results  Labs (all labs ordered are listed, but only abnormal results are displayed) Labs Reviewed  BLOOD GAS, ARTERIAL - Abnormal; Notable for the following components:      Result Value   pH, Arterial 7.485 (*)    pO2, Arterial 73.6 (*)    Acid-Base Excess 3.7 (*)    All other components within normal limits  COMPREHENSIVE METABOLIC PANEL - Abnormal; Notable for the following components:   Chloride 99 (*)    Glucose, Bld 117 (*)    ALT 13 (*)    Alkaline Phosphatase 128 (*)    All other components within normal limits  CBC WITH DIFFERENTIAL/PLATELET - Abnormal; Notable for the following components:   WBC 0.1 (*)    Platelets 57 (*)    Neutro Abs 0.0 (*)    Lymphs Abs 0.0 (*)    Monocytes Absolute 0.0 (*)    All other components within normal limits  URINALYSIS, ROUTINE W REFLEX MICROSCOPIC - Abnormal; Notable for the following components:   Protein, ur 30 (*)    Bacteria, UA RARE (*)    Squamous Epithelial / LPF 0-5 (*)    Non Squamous Epithelial 0-5 (*)    All other components within normal limits  CULTURE, BLOOD (ROUTINE X 2)  CULTURE, BLOOD (ROUTINE X 2)    EKG  EKG Interpretation None        Radiology Dg Chest 2 View  Result Date: 12/17/2017 CLINICAL DATA:  Acute onset of generalized weakness and lethargy. Mild hypoxia. Productive cough and malaise. EXAM: CHEST - 2 VIEW COMPARISON:  Chest radiograph performed 05/17/2017 FINDINGS: The lungs are well-aerated and clear. There is no evidence of focal opacification, pleural effusion or pneumothorax. The heart is normal in size; the mediastinal contour is within normal limits. No acute osseous abnormalities are seen. Right humeral hardware is incompletely imaged but appears grossly unremarkable, transfixing the right humeral diaphyseal fracture. A right-sided chest port is noted ending about the mid to distal SVC. IMPRESSION: No acute cardiopulmonary process seen. Electronically Signed   By: Garald Balding M.D.   On: 12/17/2017 22:30    Procedures Procedures (including critical care time)  Medications Ordered in ED Medications  0.9 %  sodium chloride infusion ( Intravenous New Bag/Given 12/17/17 2230)  sodium chloride 0.9 % bolus 500 mL (500 mLs Intravenous New Bag/Given 12/17/17 2230)     Initial Impression / Assessment and Plan / ED Course  I have reviewed the triage vital signs and the nursing notes.  Pertinent labs & imaging results that were available during my care of the patient were reviewed by me and considered in my medical decision making (see chart for details).  Clinical Course as of Dec 18 2342  Sat Dec 17, 2017  2150 Nonspecific symptoms on arrival, with elevated heart rate and blood pressure.  Initial treatment with IV fluid bolus and maintenance fluid.  [EW]  2319 Arterial blood gas normal except slightly low oxygen 74, on nasal cannula supplementation.  [EW]  2320 Urinalysis negative for infection  [EW]  2320 No acute signs for infection or fluid overload DG Chest 2 View [EW]  2329 Leukopenia, with neutropenia; smear reading by pathology is pending.  Neutropenic precautions begun. WBC: (!!) 0.1 [EW]  2329  Nonspecific shortness of breath, with confusion, and significant leukopenia likely from recent chemotherapy.  Consultation with oncology requested.  Patient will require admission.  [EW]  Clinical Course User Index [EW] Daleen Bo, MD     Patient Vitals for the past 24 hrs:  BP Pulse Resp SpO2  12/17/17 2205 (!) 153/81 (!) 119 12 97 %    11:37 PM Reevaluation with update and discussion. After initial assessment and treatment, an updated evaluation reveals no change in status, findings discussed with patient and family members, all questions answered. Daleen Bo   11:43 PM-Consult complete with hospitalist. Patient case explained and discussed.  He agrees to admit patient for further evaluation and treatment. Call ended at [facility] Impressions(s) / ED Diagnoses   Final diagnoses:  Chemotherapy-induced neutropenia ([place])  Weakness  Hypoxia   Malaise and fatigue, nonspecific with leukopenia/neutropenia.  Patient at risk for infection.  She requires hospitalization for management.  Nonspecific hypoxia without evidence for pneumonia, heart failure, or suspected PE.  Nursing Notes Reviewed/ Care Coordinated Applicable Imaging Reviewed Interpretation of Laboratory Data incorporated into ED treatment  Plan_admit  ED Discharge Orders    None       Daleen Bo, MD 12/21/17 2345

## 2017-12-17 NOTE — ED Triage Notes (Signed)
Pt is presented from home evaluation with c/o sudden onset of weakness with associated lethargy, mild hypoxia, productive cough and malaise. Received last chemo treatment 12/09/17. Denies CP, dyspnea or associated SOB.

## 2017-12-17 NOTE — ED Notes (Signed)
Pt and family report that ambulating will not be possible at the moment.

## 2017-12-18 ENCOUNTER — Inpatient Hospital Stay (HOSPITAL_COMMUNITY): Payer: Medicare Other

## 2017-12-18 ENCOUNTER — Other Ambulatory Visit: Payer: Self-pay

## 2017-12-18 DIAGNOSIS — C8588 Other specified types of non-Hodgkin lymphoma, lymph nodes of multiple sites: Secondary | ICD-10-CM | POA: Diagnosis not present

## 2017-12-18 DIAGNOSIS — E860 Dehydration: Secondary | ICD-10-CM | POA: Diagnosis present

## 2017-12-18 DIAGNOSIS — K5900 Constipation, unspecified: Secondary | ICD-10-CM | POA: Diagnosis not present

## 2017-12-18 DIAGNOSIS — Z880 Allergy status to penicillin: Secondary | ICD-10-CM | POA: Diagnosis not present

## 2017-12-18 DIAGNOSIS — Z90721 Acquired absence of ovaries, unilateral: Secondary | ICD-10-CM | POA: Diagnosis not present

## 2017-12-18 DIAGNOSIS — D701 Agranulocytosis secondary to cancer chemotherapy: Secondary | ICD-10-CM | POA: Diagnosis not present

## 2017-12-18 DIAGNOSIS — C8338 Diffuse large B-cell lymphoma, lymph nodes of multiple sites: Secondary | ICD-10-CM | POA: Diagnosis present

## 2017-12-18 DIAGNOSIS — Z7952 Long term (current) use of systemic steroids: Secondary | ICD-10-CM | POA: Diagnosis not present

## 2017-12-18 DIAGNOSIS — R338 Other retention of urine: Secondary | ICD-10-CM | POA: Diagnosis present

## 2017-12-18 DIAGNOSIS — Z79891 Long term (current) use of opiate analgesic: Secondary | ICD-10-CM | POA: Diagnosis not present

## 2017-12-18 DIAGNOSIS — Z791 Long term (current) use of non-steroidal anti-inflammatories (NSAID): Secondary | ICD-10-CM | POA: Diagnosis not present

## 2017-12-18 DIAGNOSIS — Z91041 Radiographic dye allergy status: Secondary | ICD-10-CM | POA: Diagnosis not present

## 2017-12-18 DIAGNOSIS — R1084 Generalized abdominal pain: Secondary | ICD-10-CM | POA: Diagnosis not present

## 2017-12-18 DIAGNOSIS — G9340 Encephalopathy, unspecified: Secondary | ICD-10-CM | POA: Diagnosis present

## 2017-12-18 DIAGNOSIS — D61818 Other pancytopenia: Secondary | ICD-10-CM | POA: Diagnosis not present

## 2017-12-18 DIAGNOSIS — R531 Weakness: Secondary | ICD-10-CM | POA: Diagnosis not present

## 2017-12-18 DIAGNOSIS — J101 Influenza due to other identified influenza virus with other respiratory manifestations: Secondary | ICD-10-CM | POA: Diagnosis present

## 2017-12-18 DIAGNOSIS — R5081 Fever presenting with conditions classified elsewhere: Secondary | ICD-10-CM | POA: Diagnosis not present

## 2017-12-18 DIAGNOSIS — K219 Gastro-esophageal reflux disease without esophagitis: Secondary | ICD-10-CM | POA: Diagnosis present

## 2017-12-18 DIAGNOSIS — Y92009 Unspecified place in unspecified non-institutional (private) residence as the place of occurrence of the external cause: Secondary | ICD-10-CM | POA: Diagnosis not present

## 2017-12-18 DIAGNOSIS — R0902 Hypoxemia: Secondary | ICD-10-CM | POA: Diagnosis present

## 2017-12-18 DIAGNOSIS — R339 Retention of urine, unspecified: Secondary | ICD-10-CM | POA: Diagnosis present

## 2017-12-18 DIAGNOSIS — C8388 Other non-follicular lymphoma, lymph nodes of multiple sites: Secondary | ICD-10-CM | POA: Diagnosis not present

## 2017-12-18 DIAGNOSIS — Z800 Family history of malignant neoplasm of digestive organs: Secondary | ICD-10-CM | POA: Diagnosis not present

## 2017-12-18 DIAGNOSIS — R198 Other specified symptoms and signs involving the digestive system and abdomen: Secondary | ICD-10-CM | POA: Diagnosis present

## 2017-12-18 DIAGNOSIS — E785 Hyperlipidemia, unspecified: Secondary | ICD-10-CM | POA: Diagnosis present

## 2017-12-18 DIAGNOSIS — D696 Thrombocytopenia, unspecified: Secondary | ICD-10-CM | POA: Diagnosis present

## 2017-12-18 DIAGNOSIS — D709 Neutropenia, unspecified: Secondary | ICD-10-CM | POA: Diagnosis not present

## 2017-12-18 DIAGNOSIS — E440 Moderate protein-calorie malnutrition: Secondary | ICD-10-CM | POA: Diagnosis not present

## 2017-12-18 DIAGNOSIS — J449 Chronic obstructive pulmonary disease, unspecified: Secondary | ICD-10-CM | POA: Diagnosis not present

## 2017-12-18 DIAGNOSIS — J439 Emphysema, unspecified: Secondary | ICD-10-CM | POA: Diagnosis present

## 2017-12-18 DIAGNOSIS — Z87891 Personal history of nicotine dependence: Secondary | ICD-10-CM | POA: Diagnosis not present

## 2017-12-18 DIAGNOSIS — T451X5A Adverse effect of antineoplastic and immunosuppressive drugs, initial encounter: Secondary | ICD-10-CM | POA: Diagnosis not present

## 2017-12-18 DIAGNOSIS — Z7982 Long term (current) use of aspirin: Secondary | ICD-10-CM | POA: Diagnosis not present

## 2017-12-18 DIAGNOSIS — Z91048 Other nonmedicinal substance allergy status: Secondary | ICD-10-CM | POA: Diagnosis not present

## 2017-12-18 DIAGNOSIS — R12 Heartburn: Secondary | ICD-10-CM | POA: Diagnosis not present

## 2017-12-18 DIAGNOSIS — Z66 Do not resuscitate: Secondary | ICD-10-CM | POA: Diagnosis present

## 2017-12-18 LAB — COMPREHENSIVE METABOLIC PANEL
ALT: 13 U/L — AB (ref 14–54)
ANION GAP: 9 (ref 5–15)
AST: 13 U/L — ABNORMAL LOW (ref 15–41)
Albumin: 3.3 g/dL — ABNORMAL LOW (ref 3.5–5.0)
Alkaline Phosphatase: 106 U/L (ref 38–126)
BUN: 12 mg/dL (ref 6–20)
CALCIUM: 8.4 mg/dL — AB (ref 8.9–10.3)
CHLORIDE: 97 mmol/L — AB (ref 101–111)
CO2: 27 mmol/L (ref 22–32)
CREATININE: 0.67 mg/dL (ref 0.44–1.00)
Glucose, Bld: 116 mg/dL — ABNORMAL HIGH (ref 65–99)
Potassium: 3.6 mmol/L (ref 3.5–5.1)
SODIUM: 133 mmol/L — AB (ref 135–145)
Total Bilirubin: 0.5 mg/dL (ref 0.3–1.2)
Total Protein: 5.7 g/dL — ABNORMAL LOW (ref 6.5–8.1)

## 2017-12-18 LAB — CBC
HCT: 29.8 % — ABNORMAL LOW (ref 36.0–46.0)
HEMOGLOBIN: 9.7 g/dL — AB (ref 12.0–15.0)
MCH: 32.2 pg (ref 26.0–34.0)
MCHC: 32.6 g/dL (ref 30.0–36.0)
MCV: 99 fL (ref 78.0–100.0)
PLATELETS: 69 10*3/uL — AB (ref 150–400)
RBC: 3.01 MIL/uL — AB (ref 3.87–5.11)
RDW: 14.1 % (ref 11.5–15.5)
WBC: 0.2 10*3/uL — AB (ref 4.0–10.5)

## 2017-12-18 LAB — LACTIC ACID, PLASMA
LACTIC ACID, VENOUS: 0.8 mmol/L (ref 0.5–1.9)
LACTIC ACID, VENOUS: 2.1 mmol/L — AB (ref 0.5–1.9)

## 2017-12-18 LAB — PROTIME-INR
INR: 1.01
PROTHROMBIN TIME: 13.2 s (ref 11.4–15.2)

## 2017-12-18 LAB — INFLUENZA PANEL BY PCR (TYPE A & B)
INFLBPCR: NEGATIVE
Influenza A By PCR: POSITIVE — AB

## 2017-12-18 LAB — PHOSPHORUS: Phosphorus: 2.2 mg/dL — ABNORMAL LOW (ref 2.5–4.6)

## 2017-12-18 LAB — APTT: aPTT: 33 seconds (ref 24–36)

## 2017-12-18 LAB — PROCALCITONIN: PROCALCITONIN: 0.19 ng/mL

## 2017-12-18 LAB — MAGNESIUM: Magnesium: 1.7 mg/dL (ref 1.7–2.4)

## 2017-12-18 LAB — TSH: TSH: 0.753 u[IU]/mL (ref 0.350–4.500)

## 2017-12-18 MED ORDER — IPRATROPIUM-ALBUTEROL 0.5-2.5 (3) MG/3ML IN SOLN
3.0000 mL | Freq: Four times a day (QID) | RESPIRATORY_TRACT | Status: DC
  Administered 2017-12-18 – 2017-12-20 (×9): 3 mL via RESPIRATORY_TRACT
  Filled 2017-12-18 (×10): qty 3

## 2017-12-18 MED ORDER — ACETAMINOPHEN 650 MG RE SUPP: 650.0000 mg | Freq: Four times a day (QID) | RECTAL | Status: DC | PRN

## 2017-12-18 MED ORDER — VANCOMYCIN HCL IN DEXTROSE 750-5 MG/150ML-% IV SOLN
750.0000 mg | INTRAVENOUS | Status: DC
  Administered 2017-12-19 – 2017-12-20 (×2): 750 mg via INTRAVENOUS
  Filled 2017-12-18 (×2): qty 150

## 2017-12-18 MED ORDER — GI COCKTAIL ~~LOC~~
30.0000 mL | Freq: Once | ORAL | Status: AC
  Administered 2017-12-18: 30 mL via ORAL
  Filled 2017-12-18: qty 30

## 2017-12-18 MED ORDER — SODIUM CHLORIDE 0.9 % IV SOLN
2.0000 g | Freq: Two times a day (BID) | INTRAVENOUS | Status: DC
  Administered 2017-12-18 – 2017-12-20 (×4): 2 g via INTRAVENOUS
  Filled 2017-12-18 (×5): qty 2

## 2017-12-18 MED ORDER — PANTOPRAZOLE SODIUM 40 MG PO TBEC
40.0000 mg | DELAYED_RELEASE_TABLET | Freq: Every day | ORAL | Status: DC
  Administered 2017-12-18 – 2017-12-19 (×2): 40 mg via ORAL
  Filled 2017-12-18 (×3): qty 1

## 2017-12-18 MED ORDER — ASPIRIN EC 81 MG PO TBEC
81.0000 mg | DELAYED_RELEASE_TABLET | Freq: Every day | ORAL | Status: DC
  Administered 2017-12-18 – 2017-12-20 (×3): 81 mg via ORAL
  Filled 2017-12-18 (×3): qty 1

## 2017-12-18 MED ORDER — TAMSULOSIN HCL 0.4 MG PO CAPS
0.4000 mg | ORAL_CAPSULE | Freq: Every day | ORAL | Status: DC
  Administered 2017-12-18 – 2017-12-19 (×2): 0.4 mg via ORAL
  Filled 2017-12-18 (×2): qty 1

## 2017-12-18 MED ORDER — GUAIFENESIN ER 600 MG PO TB12
600.0000 mg | ORAL_TABLET | Freq: Two times a day (BID) | ORAL | Status: DC
  Administered 2017-12-18 – 2017-12-20 (×5): 600 mg via ORAL
  Filled 2017-12-18 (×5): qty 1

## 2017-12-18 MED ORDER — CITALOPRAM HYDROBROMIDE 20 MG PO TABS
40.0000 mg | ORAL_TABLET | Freq: Every day | ORAL | Status: DC
  Administered 2017-12-18 – 2017-12-20 (×3): 40 mg via ORAL
  Filled 2017-12-18: qty 4
  Filled 2017-12-18 (×3): qty 2

## 2017-12-18 MED ORDER — POLYETHYLENE GLYCOL 3350 17 G PO PACK: 17.0000 g | PACK | Freq: Every day | ORAL | Status: DC | PRN

## 2017-12-18 MED ORDER — ONDANSETRON HCL 4 MG PO TABS: 4.0000 mg | ORAL_TABLET | Freq: Four times a day (QID) | ORAL | Status: DC | PRN

## 2017-12-18 MED ORDER — HYDROCODONE-ACETAMINOPHEN 5-325 MG PO TABS
1.0000 | ORAL_TABLET | ORAL | Status: AC
  Administered 2017-12-18: 1 via ORAL
  Filled 2017-12-18: qty 1

## 2017-12-18 MED ORDER — MOMETASONE FURO-FORMOTEROL FUM 200-5 MCG/ACT IN AERO
2.0000 | INHALATION_SPRAY | Freq: Two times a day (BID) | RESPIRATORY_TRACT | Status: DC
  Administered 2017-12-18 – 2017-12-20 (×5): 2 via RESPIRATORY_TRACT
  Filled 2017-12-18: qty 8.8

## 2017-12-18 MED ORDER — VANCOMYCIN HCL 10 G IV SOLR
1250.0000 mg | Freq: Once | INTRAVENOUS | Status: AC
  Administered 2017-12-18: 1250 mg via INTRAVENOUS
  Filled 2017-12-18: qty 1250

## 2017-12-18 MED ORDER — SODIUM CHLORIDE 0.9 % IV SOLN
INTRAVENOUS | Status: DC
  Administered 2017-12-18: 05:00:00 via INTRAVENOUS

## 2017-12-18 MED ORDER — FAMOTIDINE 20 MG PO TABS
20.0000 mg | ORAL_TABLET | Freq: Once | ORAL | Status: AC
  Administered 2017-12-18: 20 mg via ORAL
  Filled 2017-12-18: qty 1

## 2017-12-18 MED ORDER — OSELTAMIVIR PHOSPHATE 30 MG PO CAPS
30.0000 mg | ORAL_CAPSULE | Freq: Two times a day (BID) | ORAL | Status: DC
  Administered 2017-12-18 – 2017-12-20 (×5): 30 mg via ORAL
  Filled 2017-12-18 (×5): qty 1

## 2017-12-18 MED ORDER — ONDANSETRON HCL 4 MG/2ML IJ SOLN: 4.0000 mg | Freq: Four times a day (QID) | INTRAMUSCULAR | Status: DC | PRN

## 2017-12-18 MED ORDER — ALLOPURINOL 300 MG PO TABS
300.0000 mg | ORAL_TABLET | Freq: Every day | ORAL | Status: DC
  Administered 2017-12-18 – 2017-12-19 (×2): 300 mg via ORAL
  Filled 2017-12-18 (×3): qty 1

## 2017-12-18 MED ORDER — ALUM & MAG HYDROXIDE-SIMETH 200-200-20 MG/5ML PO SUSP
15.0000 mL | ORAL | Status: DC | PRN
  Administered 2017-12-18 (×2): 15 mL via ORAL
  Filled 2017-12-18 (×2): qty 30

## 2017-12-18 MED ORDER — SUCRALFATE 1 GM/10ML PO SUSP
1.0000 g | Freq: Three times a day (TID) | ORAL | Status: DC
  Administered 2017-12-18 – 2017-12-19 (×7): 1 g via ORAL
  Filled 2017-12-18 (×8): qty 10

## 2017-12-18 MED ORDER — ALBUTEROL SULFATE (2.5 MG/3ML) 0.083% IN NEBU: 2.5000 mg | INHALATION_SOLUTION | RESPIRATORY_TRACT | Status: DC | PRN

## 2017-12-18 MED ORDER — ACETAMINOPHEN 325 MG PO TABS: 650.0000 mg | ORAL_TABLET | Freq: Four times a day (QID) | ORAL | Status: DC | PRN

## 2017-12-18 MED ORDER — PHENOL 1.4 % MT LIQD
1.0000 | OROMUCOSAL | Status: DC | PRN
  Filled 2017-12-18: qty 177

## 2017-12-18 MED ORDER — HYDROCODONE-ACETAMINOPHEN 5-325 MG PO TABS
0.5000 | ORAL_TABLET | ORAL | Status: DC | PRN
  Administered 2017-12-18 – 2017-12-20 (×2): 1 via ORAL
  Filled 2017-12-18 (×2): qty 1

## 2017-12-18 NOTE — ED Notes (Signed)
Bladder scan highest reading at 369ml.

## 2017-12-18 NOTE — Progress Notes (Signed)
Pharmacy Antibiotic Note  Brooke Washington is a 77 y.o. female with large cell lymphoma currently undergoing chemotherapy presented to the ED on 12/17/2017 with fever and weakness.  [place] 0 on admssion.  To start vancomycin and cefepime for febrile neutropenia.  - scr 0.72 (crcl~53, rounded scr to 0.8 for age)  Plan: - vancomycin 1250 mg IV x1, then vancomycin 750 mg IV q24h - cefepime 2gm IV q12h (for crcl 30-60) ____________________________________     No data recorded.  Recent Labs  Lab 12/17/17 2202  WBC 0.1*  CREATININE 0.72    Estimated Creatinine Clearance: 50.9 mL/min (by C-G formula based on SCr of 0.72 mg/dL).    Allergies  Allergen Reactions  . Contrast Media [Iodinated Diagnostic Agents] Shortness Of Breath, Rash and Other (See Comments)    Throat swells  . Iohexol Shortness Of Breath  . Povidone-Iodine Hives, Shortness Of Breath and Rash    REACTION: throat swells  . Penicillins Hives, Itching and Rash    Has patient had a PCN reaction causing immediate rash, facial/tongue/throat swelling, SOB or lightheadedness with hypotension: Unknown Has patient had a PCN reaction causing severe rash involving mucus membranes or skin necrosis: Yes Has patient had a PCN reaction that required hospitalization: Unknown Has patient had a PCN reaction occurring within the last 10 years: Unknown If all of the above answers are "NO", then may proceed with Cephalosporin use.   . Adhesive [Tape] Hives    Thank you for allowing pharmacy to be a part of this patient's care.  Dia Sitter P 12/18/2017 1:19 AM

## 2017-12-18 NOTE — Plan of Care (Signed)
  Pain Managment: General experience of comfort will improve 12/18/2017 0613 - Progressing by Mickie Kay, RN

## 2017-12-18 NOTE — Progress Notes (Signed)
Patient has been having urinary retention since admission, able to void in small amounts. We have been holding off on the foley catheter because of her neutropenia but patient was able to void 500 ml at 1700.to monitor.urinary output.

## 2017-12-18 NOTE — Progress Notes (Signed)
Patient Demographics:    Brooke Washington, is a 77 y.o. female, DOB - [birth year], YQM:[id number]  Admit date - 12/17/2017   Admitting Physician Toy Baker, MD  Outpatient Primary MD for the patient is [name], NP  LOS - 0   Chief Complaint  Patient presents with  . Weakness  . 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     Plan:-   1) influenza A-Tamiflu, Mucinex, bronchodilators as ordered, gentle hydration, supportive and symptomatic treatment as ordered  2)Pancytopenia-suspect this is chemo induced, last chemoRX 12/09/17 and had  PEGFILGRASTIM on 4th of  March, monitor hemoglobin and platelets closely transfuse as clinically indicated, oncology consult pending, ED provider discussed this case with Dr. Marin Olp over the weekend  3)Neutropenic Fevers-   neutropenia is secondary to chemotherapy as noted above #2, fevers most likely secondary to influenza as noted above in #1, patient received PEGFILGRASTIM on 4th of  March, may discontinue vancomycin and cefepime if blood cultures from 12/18/2017 are negative in 24-48 hours.  Continue with neutropenic precautions until ANC > 1  4)ID-patient clinically is not septic,  neutropenia can be explained as above by chemotherapy, fevers can be explained by influenza, tachycardia can be explained by combination of fevers and volume depletion from poor oral intake in the setting of chemotherapy and influenza.  Elevated lactic acid is due to dehydration, poor tissue perfusion in the setting of volume contraction, decreased oral intake/dehydration,  even though pt meets SIRS criteria, she is clinically not septic, pro calcitonin is not elevated.  Explanations for SIRS components as above.   5)Urinary Retention-patient was able to void about 500 mL this evening, reluctant to leave Foley catheter in due to neutropenic state, give Flomax consider in and out catheterization if urinary retention persist  Code Status : DNR  Disposition Plan  : TBD  Consults  :  Oncology  DVT Prophylaxis  :  - SCDs    Lab Results  Component Value Date   PLT 69 (L) 12/18/2017    Inpatient Medications  Scheduled Meds: . allopurinol  300 mg Oral Daily  . aspirin EC  81 mg Oral Daily  . citalopram  40 mg Oral Daily  . guaiFENesin  600 mg Oral BID  . ipratropium-albuterol  3 mL Nebulization QID  . mometasone-formoterol  2 puff Inhalation BID  . oseltamivir  30 mg Oral BID  . pantoprazole  40 mg Oral Daily  . sucralfate  1 g Oral TID WC & HS  . tamsulosin  0.4 mg Oral  QPC supper   Continuous Infusions: . sodium chloride 75 mL/hr at 12/18/17 0524  . ceFEPime (MAXIPIME) IV Stopped (12/18/17 1704)  . [START ON 12/19/2017] vancomycin     PRN Meds:.acetaminophen **OR** acetaminophen, albuterol, alum & mag hydroxide-simeth, HYDROcodone-acetaminophen, ondansetron **OR** ondansetron (ZOFRAN) IV, phenol, polyethylene glycol    Anti-infectives (From admission, onward)   Start     Dose/Rate Route Frequency Ordered Stop   12/19/17 0200  vancomycin (VANCOCIN) IVPB 750 mg/150 ml premix     750 mg 150 mL/hr over 60 Minutes Intravenous Every 24 hours 12/18/17 0127     12/18/17 1400  ceFEPIme  (MAXIPIME) 2 g in sodium chloride 0.9 % 100 mL IVPB     2 g 200 mL/hr over 30 Minutes Intravenous Every 12 hours 12/18/17 0127     12/18/17 0900  oseltamivir (TAMIFLU) capsule 30 mg     30 mg Oral 2 times daily 12/18/17 0747 12/23/17 0959   12/18/17 0130  vancomycin (VANCOCIN) 1,250 mg in sodium chloride 0.9 % 250 mL IVPB     1,250 mg 166.7 mL/hr over 90 Minutes Intravenous  Once 12/18/17 0127 12/18/17 0657   12/18/17 0000  ceFEPIme (MAXIPIME) 2 g in sodium chloride 0.9 % 100 mL IVPB     2 g 200 mL/hr over 30 Minutes Intravenous  Once 12/17/17 2348 12/18/17 0153        Objective:   Vitals:   12/18/17 0741 12/18/17 1214 12/18/17 1359 12/18/17 1610  BP:   116/64   Pulse:   99   Resp:   18   Temp:   97.7 F (36.5 C)   TempSrc:   Oral   SpO2: 94% 99% 100% 95%    Wt Readings from Last 3 Encounters:  12/15/17 57.7 kg (127 lb 3.2 oz)  12/12/17 59.6 kg (131 lb 6.4 oz)  11/29/17 62.3 kg (137 lb 4.8 oz)     Intake/Output Summary (Last 24 hours) at 12/18/2017 1925 Last data filed at 12/18/2017 1730 Gross per 24 hour  Intake 1757.5 ml  Output 905 ml  Net 852.5 ml     Physical Exam  Gen:- Awake Alert,  In no apparent distress  HEENT:- [place].AT, No sclera icterus Neck-Supple Neck,No JVD,.  Lungs-diminished in bases, few scattered wheezes CV- S1, S2 normal Abd-  +ve B.Sounds, Abd Soft, No tenderness, no CVA tenderness   Extremity/Skin:- No  edema, no rash Psych-affect is appropriate, more awake, no longer lethargic Neuro-no new focal deficits, no tremors   Data Review:   Micro Results No results found for this or any previous visit (from the past 240 hour(s)).  Radiology Reports Dg Chest 2 View  Result Date: 12/17/2017 CLINICAL DATA:  Acute onset of generalized weakness and lethargy. Mild hypoxia. Productive cough and malaise. EXAM: CHEST - 2 VIEW COMPARISON:  Chest radiograph performed 05/17/2017 FINDINGS: The lungs are well-aerated and clear. There is no evidence of focal  opacification, pleural effusion or pneumothorax. The heart is normal in size; the mediastinal contour is within normal limits. No acute osseous abnormalities are seen. Right humeral hardware is incompletely imaged but appears grossly unremarkable, transfixing the right humeral diaphyseal fracture. A right-sided chest port is noted ending about the mid to distal SVC. IMPRESSION: No acute cardiopulmonary process seen. Electronically Signed   By: Garald Balding M.D.   On: 12/17/2017 22:30   Dg Abd 1 View  Result Date: 12/18/2017 CLINICAL DATA:  Acute onset of generalized abdominal fullness. EXAM: ABDOMEN - 1 VIEW COMPARISON:  PET/CT  performed 11/22/2017 FINDINGS: The visualized bowel gas pattern is unremarkable. Scattered air and stool filled loops of colon are seen; no abnormal dilatation of small bowel loops is seen to suggest small bowel obstruction. No free intra-abdominal air is identified, though evaluation for free air is limited on a single supine view. The visualized osseous structures are within normal limits; the sacroiliac joints are unremarkable in appearance. The visualized lung bases are essentially clear. IMPRESSION: Unremarkable bowel gas pattern; no free intra-abdominal air seen. Moderate amount of stool noted in the colon. Electronically Signed   By: Garald Balding M.D.   On: 12/18/2017 01:46   Mr Humerus Right W Wo Contrast  Result Date: 11/20/2017 CLINICAL DATA:  Nontraumatic fracture of the right humerus. History of lymphoma. Creatinine was obtained on site at [place] at [address] Results: Creatinine 1.2 mg/dL. EXAM: MRI OF THE RIGHT HUMERUS WITHOUT AND WITH CONTRAST TECHNIQUE: Multiplanar, multisequence MR imaging of the right upper extremity was performed before and after the administration of intravenous contrast. CONTRAST:  8m MULTIHANCE GADOBENATE DIMEGLUMINE 529 MG/ML IV SOLN COMPARISON:  Radiographs 11/15/2017 FINDINGS: As demonstrated on the radiographs there  is a displaced fracture involving the proximal humeral diaphysis with slight angulation. The marrow is completely replaced with low T1 and heterogeneous high T2 signal intensity most likely osseous lymphoma. This also demonstrates diffuse enhancement. The epiphyseal regions are spared. Edema/inflammation/hemorrhage noted surrounding the fracture and in the musculature and in the subcutaneous tissues. The visualized scapula and clavicle are unremarkable. No obvious rib lesions. IMPRESSION: Mildly displaced and angulated pathologic fracture of the proximal humeral shaft. 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 Electronically Signed   By: PMarijo SanesM.D.   On: 11/20/2017 08:17   Nm Pet Image Restag (ps) Skull Base To Thigh  Result Date: 11/22/2017 CLINICAL DATA:  Subsequent treatment strategy for non-Hodgkin lymphoma. EXAM: NUCLEAR MEDICINE PET SKULL BASE TO THIGH TECHNIQUE: 6.7 mCi F-18 FDG was injected intravenously. Full-ring PET imaging was performed from the skull base to thigh after the radiotracer. CT data was obtained and used for attenuation correction and anatomic localization. FASTING BLOOD GLUCOSE:  Value: 95 mg/dl COMPARISON:  CT on 09/16/2014 and PET-CT on 09/09/2009 FINDINGS: NECK:  No hypermetabolic lymph nodes or masses. CHEST: New Sub-cm hypermetabolic lymph nodes are seen in the right internal mammary chain and along the posterior wall the right mainstem bronchus, with SUV max of 11.1. New hypermetabolic activity is also seen in the right hilum, with SUV max of 5.7. No suspicious pulmonary nodules are seen on CT images. ABDOMEN/PELVIS: New large hypermetabolic mass is seen in the posterior right hepatic lobe with SUV max of 33.5. No evidence of splenic involvement. A focal hypermetabolic lesion is seen lateral midpole of the left kidney which has SUV max of 18.5. This is suspicious for renal involvement by lymphoma, however primary renal cell carcinoma cannot  be excluded. Hypermetabolic lymphadenopathy is seen in the right cardiophrenic angle, porta hepatis, and abdominal retroperitoneum in the retrocaval, aortocaval and left paraaortic spaces. Index lymphadenopathy in the porta hepatis measures 3.7 cm in short axis on image 103/4, with SUV max of 30.3. No pelvic lymphadenopathy identified. Small hiatal hernia again noted. SKELETON: Hypermetabolic mass is seen at site of pathologic fracture in the proximal humeral diaphysis, which has SUV max of 17.6. A sub-cm hypermetabolic lymph node is seen along the lateral aspect of this mass in the adjacent subcutaneous tissues with SUV max of 5.7. Small foci of  hypermetabolic activity are also seen in the right posterior chest wall in the intercostal space between the right posterior 10th and 11th ribs. IMPRESSION: New hypermetabolic lymphadenopathy within the chest and abdomen, consistent with recurrent lymphoma. New large hypermetabolic mass in the right hepatic lobe, consistent with lymphoma this involvement. New small hypermetabolic mass in the left kidney, which may be due to lymphomatous involvement although primary renal cell carcinoma cannot be excluded. Hypermetabolic mass in the right humerus with pathologic fracture, consistent with lymphomatous involvement. Small hypermetabolic foci in right posterior chest wall, also suspicious for recurrent lymphoma. Electronically Signed   By: Earle Gell M.D.   On: 11/22/2017 10:47   US Biopsy (liver)  Result Date: 11/25/2017 INDICATION: 77 year old female with a history of lymphoma.  Liver mass EXAM: ULTRASOUND-GUIDED LIVER MASS BIOPSY MEDICATIONS: None. ANESTHESIA/SEDATION: Moderate (conscious) sedation was employed during this procedure. A total of Versed 1.0 mg and Fentanyl 50 mcg was administered intravenously. Moderate Sedation Time: 10 minutes. The patient's level of consciousness and vital signs were monitored continuously by radiology nursing throughout the procedure  under my direct supervision. FLUOROSCOPY TIME:  NONE COMPLICATIONS: NONE PROCEDURE: Informed written consent was obtained from the patient after a thorough discussion of the procedural risks, benefits and alternatives. All questions were addressed. Maximal Sterile Barrier Technique was utilized including caps, mask, sterile gowns, sterile gloves, sterile drape, hand hygiene and skin antiseptic. A timeout was performed prior to the initiation of the procedure. Patient positioned supine position on the ultrasound stretcher. Images were stored of the upper abdomen. Patient is prepped and draped in the usual sterile fashion. 1% lidocaine was used for local anesthesia. Using ultrasound guidance, guide needle was advanced to the liver mass. Multiple 18 gauge core biopsy were achieved. Patient tolerated the procedure well and remained hemodynamically stable throughout. No complications were encountered and no significant blood loss. IMPRESSION: Status post ultrasound-guided biopsy of liver mass. Tissue specimen sent to pathology for complete histopathologic analysis. Signed, Dulcy Fanny. Earleen Newport, DO [facility] Electronically Signed   By: Corrie Mckusick D.O.   On: 11/25/2017 09:26   Ir US Guide Vasc Access Right  Result Date: 12/07/2017 INDICATION: History of lymphoma. In need of durable intravenous access for chemotherapy administration. EXAM: IMPLANTED PORT A CATH PLACEMENT WITH ULTRASOUND AND FLUOROSCOPIC GUIDANCE COMPARISON:  PET-CT - 11/22/2016 MEDICATIONS: Clindamycin 600 mg IV; The antibiotic was administered within an appropriate time interval prior to skin puncture. ANESTHESIA/SEDATION: Moderate (conscious) sedation was employed during this procedure. A total of Versed 1 mg and Fentanyl 75 mcg was administered intravenously. Moderate Sedation Time: 30 minutes. The patient's level of consciousness and vital signs were monitored continuously by radiology  nursing throughout the procedure under my direct supervision. CONTRAST:  None FLUOROSCOPY TIME:  54 seconds (5 mGy) COMPLICATIONS: None immediate. PROCEDURE: The procedure, risks, benefits, and alternatives were explained to the patient. Questions regarding the procedure were encouraged and answered. The patient understands and consents to the procedure. The right neck and chest were prepped with chlorhexidine in a sterile fashion, and a sterile drape was applied covering the operative field. Maximum barrier sterile technique with sterile gowns and gloves were used for the procedure. A timeout was performed prior to the initiation of the procedure. Local anesthesia was provided with 1% lidocaine with epinephrine. After creating a small venotomy incision, a micropuncture kit was utilized to access the internal jugular vein. Real-time ultrasound guidance was utilized for vascular access including the acquisition of a permanent ultrasound  image documenting patency of the accessed vessel. The microwire was utilized to measure appropriate catheter length. A subcutaneous port pocket was then created along the upper chest wall utilizing a combination of sharp and blunt dissection. The pocket was irrigated with sterile saline. A single lumen thin power injectable port was chosen for placement. The 8 Fr catheter was tunneled from the port pocket site to the venotomy incision. The port was placed in the pocket. The external catheter was trimmed to appropriate length. At the venotomy, an 8 Fr peel-away sheath was placed over a guidewire under fluoroscopic guidance. The catheter was then placed through the sheath and the sheath was removed. Final catheter positioning was confirmed and documented with a fluoroscopic spot radiograph. The port was accessed with a Huber needle, aspirated and flushed with heparinized saline. The venotomy site was closed with an interrupted 4-0 Vicryl suture. The port pocket incision was closed with  interrupted 2-0 Vicryl suture and the skin was opposed with a running subcuticular 4-0 Vicryl suture. Dermabond and Steri-strips were applied to both incisions. Dressings were placed. The patient tolerated the procedure well without immediate post procedural complication. FINDINGS: After catheter placement, the tip lies within the superior cavoatrial junction. The catheter aspirates and flushes normally and is ready for immediate use. IMPRESSION: Successful placement of a right internal jugular approach power injectable Port-A-Cath. The catheter is ready for immediate use. Electronically Signed   By: Sandi Mariscal M.D.   On: 12/07/2017 15:33   Dg Humerus Right  Result Date: 11/23/2017 CLINICAL DATA:  The patient has undergone ORIF for a pathologic fracture through the junction of the proximal and mid thirds of the shaft of the right humerus. EXAM: RIGHT HUMERUS - 2+ VIEW COMPARISON:  Intraoperative fluoro spot radiographs of today's date FINDINGS: The patient has undergone placement of an intramedullary rod with securing screws for fixation of a fracture at the junction of the proximal and mid thirds of the right humerus. Alignment is now more nearly anatomic. The observed portions of the shoulder and elbow are grossly normal. IMPRESSION: No postprocedure complication following ORIF for a pathologic right humeral shaft fracture. Electronically Signed   By: David  Martinique M.D.   On: 11/23/2017 15:26   Dg Humerus Right  Result Date: 11/23/2017 CLINICAL DATA:  Lymphoma.  Pathologic right humeral fracture. EXAM: DG C-ARM 61-120 MIN; RIGHT HUMERUS - 2+ VIEW COMPARISON:  Radiographs 11/15/2017.  MRI 11/19/2017. FLUOROSCOPY TIME:  2 min and 30 sec. C-arm fluoroscopic images were obtained intraoperatively and submitted for post operative interpretation. Please see the performing provider's procedural report for the fluoroscopy time utilized. FINDINGS: Nine spot fluoroscopic images are submitted. Initial images  demonstrate an angulated pathologic fracture of the proximal right humeral diaphysis. Subsequent images demonstrate the placement of a right humeral intramedullary nail secured by 2 proximal and 1 distal interlocking screws. There is near anatomic reduction of the fracture. No complications identified. IMPRESSION: Near anatomic reduction of pathologic proximal right humeral fracture post intramedullary nailing. Electronically Signed   By: Richardean Sale M.D.   On: 11/23/2017 15:06   Dg C-arm 1-60 Min  Result Date: 11/23/2017 CLINICAL DATA:  Lymphoma.  Pathologic right humeral fracture. EXAM: DG C-ARM 61-120 MIN; RIGHT HUMERUS - 2+ VIEW COMPARISON:  Radiographs 11/15/2017.  MRI 11/19/2017. FLUOROSCOPY TIME:  2 min and 30 sec. C-arm fluoroscopic images were obtained intraoperatively and submitted for post operative interpretation. Please see the performing provider's procedural report for the fluoroscopy time utilized. FINDINGS: Nine spot fluoroscopic images are  submitted. Initial images demonstrate an angulated pathologic fracture of the proximal right humeral diaphysis. Subsequent images demonstrate the placement of a right humeral intramedullary nail secured by 2 proximal and 1 distal interlocking screws. There is near anatomic reduction of the fracture. No complications identified. IMPRESSION: Near anatomic reduction of pathologic proximal right humeral fracture post intramedullary nailing. Electronically Signed   By: Richardean Sale M.D.   On: 11/23/2017 15:06   Ir Fluoro Guide Port Insertion Right  Result Date: 12/07/2017 INDICATION: History of lymphoma. In need of durable intravenous access for chemotherapy administration. EXAM: IMPLANTED PORT A CATH PLACEMENT WITH ULTRASOUND AND FLUOROSCOPIC GUIDANCE COMPARISON:  PET-CT - 11/22/2016 MEDICATIONS: Clindamycin 600 mg IV; The antibiotic was administered within an appropriate time interval prior to skin puncture. ANESTHESIA/SEDATION: Moderate (conscious)  sedation was employed during this procedure. A total of Versed 1 mg and Fentanyl 75 mcg was administered intravenously. Moderate Sedation Time: 30 minutes. The patient's level of consciousness and vital signs were monitored continuously by radiology nursing throughout the procedure under my direct supervision. CONTRAST:  None FLUOROSCOPY TIME:  54 seconds (5 mGy) COMPLICATIONS: None immediate. PROCEDURE: The procedure, risks, benefits, and alternatives were explained to the patient. Questions regarding the procedure were encouraged and answered. The patient understands and consents to the procedure. The right neck and chest were prepped with chlorhexidine in a sterile fashion, and a sterile drape was applied covering the operative field. Maximum barrier sterile technique with sterile gowns and gloves were used for the procedure. A timeout was performed prior to the initiation of the procedure. Local anesthesia was provided with 1% lidocaine with epinephrine. After creating a small venotomy incision, a micropuncture kit was utilized to access the internal jugular vein. Real-time ultrasound guidance was utilized for vascular access including the acquisition of a permanent ultrasound image documenting patency of the accessed vessel. The microwire was utilized to measure appropriate catheter length. A subcutaneous port pocket was then created along the upper chest wall utilizing a combination of sharp and blunt dissection. The pocket was irrigated with sterile saline. A single lumen thin power injectable port was chosen for placement. The 8 Fr catheter was tunneled from the port pocket site to the venotomy incision. The port was placed in the pocket. The external catheter was trimmed to appropriate length. At the venotomy, an 8 Fr peel-away sheath was placed over a guidewire under fluoroscopic guidance. The catheter was then placed through the sheath and the sheath was removed. Final catheter positioning was confirmed  and documented with a fluoroscopic spot radiograph. The port was accessed with a Huber needle, aspirated and flushed with heparinized saline. The venotomy site was closed with an interrupted 4-0 Vicryl suture. The port pocket incision was closed with interrupted 2-0 Vicryl suture and the skin was opposed with a running subcuticular 4-0 Vicryl suture. Dermabond and Steri-strips were applied to both incisions. Dressings were placed. The patient tolerated the procedure well without immediate post procedural complication. FINDINGS: After catheter placement, the tip lies within the superior cavoatrial junction. The catheter aspirates and flushes normally and is ready for immediate use. IMPRESSION: Successful placement of a right internal jugular approach power injectable Port-A-Cath. The catheter is ready for immediate use. Electronically Signed   By: Sandi Mariscal M.D.   On: 12/07/2017 15:33     CBC Recent Labs  Lab 12/17/17 2202 12/18/17 0431  WBC 0.1* 0.2*  HGB 14.0 9.7*  HCT 43.3 29.8*  PLT 57* 69*  MCV 99.3 99.0  MCH 32.1 32.2  MCHC 32.3 32.6  RDW 14.1 14.1  LYMPHSABS 0.0*  --   MONOABS 0.0*  --   EOSABS 0.0  --   BASOSABS 0.0  --     Chemistries  Recent Labs  Lab 12/17/17 2202 12/18/17 0431  NA 138 133*  K 4.0 3.6  CL 99* 97*  CO2 29 27  GLUCOSE 117* 116*  BUN 14 12  CREATININE 0.72 0.67  CALCIUM 9.3 8.4*  MG  --  1.7  AST 16 13*  ALT 13* 13*  ALKPHOS 128* 106  BILITOT 0.3 0.5   ------------------------------------------------------------------------------------------------------------------ No results for input(s): CHOL, HDL, LDLCALC, TRIG, CHOLHDL, LDLDIRECT in the last 72 hours.  No results found for: HGBA1C ------------------------------------------------------------------------------------------------------------------ Recent Labs    12/18/17 0431  TSH 0.753    ------------------------------------------------------------------------------------------------------------------ No results for input(s): VITAMINB12, FOLATE, FERRITIN, TIBC, IRON, RETICCTPCT in the last 72 hours.  Coagulation profile Recent Labs  Lab 12/18/17 0431  INR 1.01    No results for input(s): DDIMER in the last 72 hours.  Cardiac Enzymes No results for input(s): CKMB, TROPONINI, MYOGLOBIN in the last 168 hours.  Invalid input(s): CK ------------------------------------------------------------------------------------------------------------------ No results found for: BNP   Roxan Hockey M.D on 12/18/2017 at 7:25 PM  Between 7am to 7pm - Pager - [phone]  After 7pm go to [email or web address] - password [email or web address]  [facility] -  Office  [phone]   Voice Recognition Viviann Spare dictation system was used to create this note, attempts have been made to correct errors. Please contact the author with questions and/or clarifications.

## 2017-12-18 NOTE — Progress Notes (Signed)
PHARMACY NOTE:  ANTIMICROBIAL RENAL DOSAGE ADJUSTMENT  Current antimicrobial regimen includes a mismatch between antimicrobial dosage and estimated renal function.  As per policy approved by the Pharmacy & Therapeutics and Medical Executive Committees, the antimicrobial dosage will be adjusted accordingly.  Current antimicrobial dosage:  Tamiflu 75mg  PO BID x 5 days  Indication: Influenza treatment  Renal Function:  Estimated Creatinine Clearance: 50.9 mL/min (by C-G formula based on SCr of 0.67 mg/dL). []      On intermittent HD, scheduled: []      On CRRT    Antimicrobial dosage has been changed to:  30mg  PO BID x 5 days  Additional comments:   Thank you for allowing pharmacy to be a part of this patient's care.  Peggyann Juba, PharmD, BCPS Pager: [phone] 12/18/2017 7:48 AM

## 2017-12-18 NOTE — Progress Notes (Signed)
CRITICAL VALUE ALERT  Critical Value:  WBC 0.22  Date & Time Notied:  12/18/17 0606  Provider Notified: Kennon Holter  Orders Received/Actions taken:none

## 2017-12-19 ENCOUNTER — Encounter: Payer: Self-pay | Admitting: Hematology and Oncology

## 2017-12-19 ENCOUNTER — Telehealth: Payer: Self-pay | Admitting: *Deleted

## 2017-12-19 DIAGNOSIS — C8388 Other non-follicular lymphoma, lymph nodes of multiple sites: Secondary | ICD-10-CM

## 2017-12-19 DIAGNOSIS — K5900 Constipation, unspecified: Secondary | ICD-10-CM

## 2017-12-19 DIAGNOSIS — R12 Heartburn: Secondary | ICD-10-CM

## 2017-12-19 DIAGNOSIS — E440 Moderate protein-calorie malnutrition: Secondary | ICD-10-CM

## 2017-12-19 DIAGNOSIS — D61818 Other pancytopenia: Secondary | ICD-10-CM

## 2017-12-19 LAB — BASIC METABOLIC PANEL
Anion gap: 6 (ref 5–15)
BUN: 8 mg/dL (ref 6–20)
CHLORIDE: 103 mmol/L (ref 101–111)
CO2: 26 mmol/L (ref 22–32)
Calcium: 8.5 mg/dL — ABNORMAL LOW (ref 8.9–10.3)
Creatinine, Ser: 0.59 mg/dL (ref 0.44–1.00)
GFR calc Af Amer: >60 mL/min (ref >60)
GFR calc non Af Amer: >60 mL/min (ref >60)
GLUCOSE: 104 mg/dL — AB (ref 65–99)
POTASSIUM: 3.3 mmol/L — AB (ref 3.5–5.1)
Sodium: 135 mmol/L (ref 135–145)

## 2017-12-19 LAB — CBC
HCT: 27.3 % — ABNORMAL LOW (ref 36.0–46.0)
HEMOGLOBIN: 9.1 g/dL — AB (ref 12.0–15.0)
MCH: 32.9 pg (ref 26.0–34.0)
MCHC: 33.3 g/dL (ref 30.0–36.0)
MCV: 98.6 fL (ref 78.0–100.0)
PLATELETS: 59 10*3/uL — AB (ref 150–400)
RBC: 2.77 MIL/uL — AB (ref 3.87–5.11)
RDW: 14.4 % (ref 11.5–15.5)
WBC: 1.1 10*3/uL — CL (ref 4.0–10.5)

## 2017-12-19 MED ORDER — PANTOPRAZOLE SODIUM 40 MG PO TBEC
40.0000 mg | DELAYED_RELEASE_TABLET | Freq: Two times a day (BID) | ORAL | Status: DC
  Administered 2017-12-19 – 2017-12-20 (×2): 40 mg via ORAL
  Filled 2017-12-19 (×2): qty 1

## 2017-12-19 MED ORDER — POLYETHYLENE GLYCOL 3350 17 G PO PACK
17.0000 g | PACK | Freq: Every day | ORAL | Status: DC
  Administered 2017-12-20: 17 g via ORAL
  Filled 2017-12-19: qty 1

## 2017-12-19 MED ORDER — SENNA 8.6 MG PO TABS
1.0000 | ORAL_TABLET | Freq: Every day | ORAL | Status: DC
  Administered 2017-12-19: 8.6 mg via ORAL
  Filled 2017-12-19: qty 1

## 2017-12-19 MED ORDER — FENTANYL 25 MCG/HR TD PT72
25.0000 ug | MEDICATED_PATCH | Freq: Once | TRANSDERMAL | Status: DC
  Administered 2017-12-19: 25 ug via TRANSDERMAL
  Filled 2017-12-19: qty 1

## 2017-12-19 MED ORDER — SENNA 8.6 MG PO TABS
1.0000 | ORAL_TABLET | Freq: Two times a day (BID) | ORAL | Status: DC
  Administered 2017-12-19 – 2017-12-20 (×2): 8.6 mg via ORAL
  Filled 2017-12-19 (×2): qty 1

## 2017-12-19 MED ORDER — ENSURE ENLIVE PO LIQD: 237.0000 mL | Freq: Two times a day (BID) | ORAL | Status: DC

## 2017-12-19 MED ORDER — SODIUM CHLORIDE 0.9 % IV SOLN
INTRAVENOUS | Status: AC
  Administered 2017-12-19 – 2017-12-20 (×2): via INTRAVENOUS

## 2017-12-19 NOTE — Progress Notes (Signed)
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[place]) Most of her symptoms are likely related to recent chemotherapy Continue supportive care I plan to reduce future doses of chemotherapy due to poor tolerance to treatment  Neutropenic fever So far, cultures are negative Neutropenia is resolving Continue broad-spectrum IV antibiotics until tomorrow If cultures remain negative, she can stop IV antibiotics and continue on Tamiflu only for influenza A infection  Heart burn She have signs and symptoms to suggest severe reflux disease, likely exacerbated by recent steroid treatment She is on proton pump inhibitor along with sucralfate  Moderate protein calorie malnutrition, poor appetite She has been seen by dietitian.  Encourage oral intake  Constipation Recommend aggressive laxatives  Pancytopenia She is not symptomatic.  She does not need transfusion support  Discharge planning Overall, she is improving.  Hopefully she can be discharged the next 24-48 hours  [name], MD 12/19/2017  4:08 PM   Subjective:  Patient is well-known to me.  I was requested by her husband to check on the patient.  I saw her last week.  She was admitted to the hospital over the weekend due to acute confusion and neutropenic fever.  Cultures were positive for influenza.  Since hospitalization, she continues to improve.  Her throat pain has resolved.  She has some mild occasional cough.  She continues to have intermittent heartburn sensation.  No nausea.  She is constipated for 2 days. She denies bone pain.  There is no reported recent fever or chills. 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  Presented w bulky adenopathy, splenomegaly and bone  marrow involvement c/w B-cell nonHodgkins lymphoma, low grade mixed follicular and diffuse. She was treated with chlorambucil in 2000.   --2007 - 09/2008.  Treated with single agent Rituxan.   --12/2008.  She had progressive adenopathy in chest and abdomen and repeat biopsy showed low grade follicular and diffuse B cell NHL.   --01/2009 - 07/2009. She required left ureteral stent and was treated with Treanda/Rituxan x 5 cycles from 02-2009 thru 06-2009.       11/20/2008 Imaging    Mildly displaced and angulated pathologic fracture of the proximal humeral shaft. 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       09/16/2014 Imaging    No lymphadenopathy in the chest, abdomen, or pelvis. The small right axillary lymph nodes seen on the previous exam are unchanged in the 18 month interval. Small to moderate hiatal hernia.      11/22/2017 PET scan    New hypermetabolic lymphadenopathy within the chest and abdomen, consistent with recurrent lymphoma. New large hypermetabolic mass in the right hepatic lobe, consistent with lymphoma this involvement. New small hypermetabolic mass in the left kidney, which may be due to lymphomatous involvement although primary renal cell carcinoma cannot be excluded. Hypermetabolic mass in the right humerus with pathologic fracture, consistent with lymphomatous involvement. Small hypermetabolic foci in right posterior chest wall, also suspicious for recurrent lymphoma.      11/23/2017 Procedure    Bone, fragment(s), Right Humeral Shaft Reamings - ATYPICAL LYMPHOID INFILTRATE CONSISTENT WITH NON-HODGKIN'S B CELL LYMPHOMA. - SEE ONCOLOGY TABLE. Microscopic Comment LYMPHOMA Histologic type: Non-Hodgkins lymphoma,  favor large cell type. Grade (if applicable): Favor high grade. Flow cytometry: No specimen is available for analysis. Immunohistochemical stains: BCL-2, BCL-6, CD3, CD5, CD10, CD15, CD20, CD30, CD34, CD43, LCA, CD79a, CD138,  Ki-67, PAX-5 and TdT with appropriate controls as performed on block 1C. Touch preps/imprints: Not performed. Comments: The sections show multiple, primarily soft tissue fragments displaying a dense lymphoid infiltrate characterized by a mixture of small round to slightly irregular lymphocytes, histiocytes, and large centroblastic appearing lymphoid cells with partially clumped to vesicular chromatin, small nucleoli and amphophilic to clear cytoplasm. The number of admixed large lymphoid cells is variable but they appear relatively abundant in some areas associated with clustering. No material is available for flow cytometric analysis. Hence, a large batter of immunohistochemical stains were performed and show a mixture of T and B cells but with a significant B cell component mostly composed of large lymphoid cells with positivity for CD79a, PAX-5, CD30, BCL-2, and BCL-6. LCA shows diffuse staining although some of the large lymphoid cells appear negative. No significant staining is seen with CD20, CD10, CD15, CD34, TdT, or CD138. The lack of CD20 expression in this material is possibly related to prior treatment. There is variable increased expression of Ki-67 ranging from 10 to >50% in some areas particularly where there is abundance of large lymphoid cells. There is admixed abundant T cell population in the background as seen with CD3, CD5, and CD43 with on apparent co-expression of CD5 in B cell areas. The overall findings are consistent with involvement by non-Hodgkin's B cell lymphoma and the presence of relative abundance of large lymphoid B-cells and increased Ki-67 expression favor a high grade large B cell lymphoma.      11/23/2017 Surgery    1. CPT 24516-Intramedullary nailing of right humerus fracture 2. CPT 20245-Bone biopsy, deep        11/23/2017 - 11/25/2017 Hospital Admission    She was admitted to the hospital for management of humerus fracture and aggressive lymphoma      11/24/2017  Imaging    ECHO showed LV EF: 65% -   70%      11/25/2017 Procedure    Status post ultrasound-guided biopsy of liver mass. Tissue specimen sent to pathology for complete histopathologic analysis      12/07/2017 Imaging    Successful placement of a right internal jugular approach power injectable Port-A-Cath. The catheter is ready for immediate use.      12/09/2017 -  Chemotherapy    The patient had R-CHOP chemo         Objective:  Vitals:   12/19/17 1250 12/19/17 1545  BP:    Pulse:    Resp:    Temp:    SpO2: 95% 98%     Intake/Output Summary (Last 24 hours) at 12/19/2017 1608 Last data filed at 12/19/2017 1300 Gross per 24 hour  Intake 1837.5 ml  Output 1800 ml  Net 37.5 ml    GENERAL:alert, no distress and comfortable. She looks thin SKIN: skin color, texture, turgor are normal, no rashes or significant lesions EYES: normal, Conjunctiva are pink and non-injected, sclera clear OROPHARYNX:no exudate, no erythema and lips, buccal mucosa, and tongue normal  NECK: supple, thyroid normal size, non-tender, without nodularity LYMPH:  no palpable lymphadenopathy in the cervical, axillary or inguinal LUNGS: clear to auscultation and percussion with normal breathing effort HEART: regular rate & rhythm and no murmurs and no lower extremity edema ABDOMEN:abdomen soft, mild epigastric tenderness and normal bowel sounds Musculoskeletal:no cyanosis of  digits and no clubbing  NEURO: alert & oriented x 3 with fluent speech, no focal motor/sensory deficits   Labs:  Lab Results  Component Value Date   WBC 1.1 (LL) 12/19/2017   HGB 9.1 (L) 12/19/2017   HCT 27.3 (L) 12/19/2017   MCV 98.6 12/19/2017   PLT 59 (L) 12/19/2017   NEUTROABS 0.0 (L) 12/17/2017    Lab Results  Component Value Date   NA 135 12/19/2017   K 3.3 (L) 12/19/2017   CL 103 12/19/2017   CO2 26 12/19/2017    Studies:  Dg Chest 2 View  Result Date: 12/17/2017 CLINICAL DATA:  Acute onset of generalized  weakness and lethargy. Mild hypoxia. Productive cough and malaise. EXAM: CHEST - 2 VIEW COMPARISON:  Chest radiograph performed 05/17/2017 FINDINGS: The lungs are well-aerated and clear. There is no evidence of focal opacification, pleural effusion or pneumothorax. The heart is normal in size; the mediastinal contour is within normal limits. No acute osseous abnormalities are seen. Right humeral hardware is incompletely imaged but appears grossly unremarkable, transfixing the right humeral diaphyseal fracture. A right-sided chest port is noted ending about the mid to distal SVC. IMPRESSION: No acute cardiopulmonary process seen. Electronically Signed   By: Garald Balding M.D.   On: 12/17/2017 22:30   Dg Abd 1 View  Result Date: 12/18/2017 CLINICAL DATA:  Acute onset of generalized abdominal fullness. EXAM: ABDOMEN - 1 VIEW COMPARISON:  PET/CT performed 11/22/2017 FINDINGS: The visualized bowel gas pattern is unremarkable. Scattered air and stool filled loops of colon are seen; no abnormal dilatation of small bowel loops is seen to suggest small bowel obstruction. No free intra-abdominal air is identified, though evaluation for free air is limited on a single supine view. The visualized osseous structures are within normal limits; the sacroiliac joints are unremarkable in appearance. The visualized lung bases are essentially clear. IMPRESSION: Unremarkable bowel gas pattern; no free intra-abdominal air seen. Moderate amount of stool noted in the colon. Electronically Signed   By: Garald Balding M.D.   On: 12/18/2017 01:46

## 2017-12-19 NOTE — Progress Notes (Signed)
[facility] PROGRESS NOTE  Brooke Washington KNL:[id number] DOB: Jul 07, 1941 DOA: 12/17/2017 PCP: [name], NP  Brief summary   77 year old female with past medical history relevant for Large cell lymphomaon currently chemotherapy, COPD, hyperlipidemia, GERD,last chemoRX 12/09/17 and had PEGFILGRASTIMon 4th of  March admitted on 12/17/2017 with fevers, lethargy and altered mentation, found to have influenza A and urinary retention.   Assessment/Plan:  Influenza A-Tamiflu, Mucinex, bronchodilators as ordered, gentle hydration, supportive and symptomatic treatment as ordered  Pancytopenia-suspect this is chemo induced, last chemoRX 12/09/17 and had PEGFILGRASTIMon 4th of  March, monitor hemoglobin and platelets closely transfuse as clinically indicated, oncology consult pending, ED provider discussed this case with Dr. Marin Olp over the weekend  Neutropenic Fevers-   neutropenia is secondary to chemotherapy as noted above #2, fevers most likely secondary to influenza as noted above in #1, patient received PEGFILGRASTIMon 4th of  March -cont vancomycin and cefepime pend blood cultures from 12/18/2017.  Continue with neutropenic precautions until Branch > 1  patient clinically is not septic, neutropenia can be explained as above by chemotherapy, fevers can be explained by influenza, tachycardia can be explained by combination of fevers and volume depletion from poor oral intake in the setting of chemotherapy and influenza.  Elevated lactic acid is due to dehydration, poor tissue perfusion in the setting of volume contraction, decreased oral intake/dehydration,  even though pt meets SIRS criteria, she is clinically not septic, pro calcitonin is not elevated.  Explanations for SIRS components as above.   Urinary Retention-patient was able to void about 500 mL this evening, reluctant to leave Foley catheter in due to neutropenic state, give Flomax consider in and out catheterization if urinary  retention persist   Code Status: DNR Family Communication: d/w patient, her family (indicate person spoken with, relationship, and if by phone, the number) Disposition Plan: pend clinical improvement    Consultants:  Oncology   Procedures:  none  Antibiotics: Anti-infectives (From admission, onward)   Start     Dose/Rate Route Frequency Ordered Stop   12/19/17 0200  vancomycin (VANCOCIN) IVPB 750 mg/150 ml premix     750 mg 150 mL/hr over 60 Minutes Intravenous Every 24 hours 12/18/17 0127     12/18/17 1400  ceFEPIme (MAXIPIME) 2 g in sodium chloride 0.9 % 100 mL IVPB     2 g 200 mL/hr over 30 Minutes Intravenous Every 12 hours 12/18/17 0127     12/18/17 0900  oseltamivir (TAMIFLU) capsule 30 mg     30 mg Oral 2 times daily 12/18/17 0747 12/23/17 0959   12/18/17 0130  vancomycin (VANCOCIN) 1,250 mg in sodium chloride 0.9 % 250 mL IVPB     1,250 mg 166.7 mL/hr over 90 Minutes Intravenous  Once 12/18/17 0127 12/18/17 0657   12/18/17 0000  ceFEPIme (MAXIPIME) 2 g in sodium chloride 0.9 % 100 mL IVPB     2 g 200 mL/hr over 30 Minutes Intravenous  Once 12/17/17 2348 12/18/17 0153        (indicate start date, and stop date if known)  HPI/Subjective: Alert, reports stomach upset. No vomiting.   Objective: Vitals:   12/18/17 2042 12/19/17 0402  BP:  129/73  Pulse:  (!) 106  Resp:  14  Temp:  98 F (36.7 C)  SpO2: 95% 94%    Intake/Output Summary (Last 24 hours) at 12/19/2017 0933 Last data filed at 12/19/2017 0810 Gross per 24 hour  Intake 1937.5 ml  Output 1450 ml  Net 487.5 ml  There were no vitals filed for this visit.  Exam:   General:  No distress   Cardiovascular: s1,s2 rrr  Respiratory: few crackles  RLL  Abdomen: soft, nt, nd   Musculoskeletal: no leg edema    Data Reviewed: Basic Metabolic Panel: Recent Labs  Lab 12/17/17 2202 12/18/17 0431 12/19/17 0500  NA 138 133* 135  K 4.0 3.6 3.3*  CL 99* 97* 103  CO2 29 27 26   GLUCOSE 117*  116* 104*  BUN 14 12 8   CREATININE 0.72 0.67 0.59  CALCIUM 9.3 8.4* 8.5*  MG  --  1.7  --   PHOS  --  2.2*  --    Liver Function Tests: Recent Labs  Lab 12/17/17 2202 12/18/17 0431  AST 16 13*  ALT 13* 13*  ALKPHOS 128* 106  BILITOT 0.3 0.5  PROT 6.5 5.7*  ALBUMIN 3.7 3.3*   No results for input(s): LIPASE, AMYLASE in the last 168 hours. No results for input(s): AMMONIA in the last 168 hours. CBC: Recent Labs  Lab 12/17/17 2202 12/18/17 0431 12/19/17 0500  WBC 0.1* 0.2* 1.1*  NEUTROABS 0.0*  --   --   HGB 14.0 9.7* 9.1*  HCT 43.3 29.8* 27.3*  MCV 99.3 99.0 98.6  PLT 57* 69* 59*   Cardiac Enzymes: No results for input(s): CKTOTAL, CKMB, CKMBINDEX, TROPONINI in the last 168 hours. BNP (last 3 results) No results for input(s): BNP in the last 8760 hours.  ProBNP (last 3 results) No results for input(s): PROBNP in the last 8760 hours.  CBG: No results for input(s): GLUCAP in the last 168 hours.  No results found for this or any previous visit (from the past 240 hour(s)).   Studies: Dg Chest 2 View  Result Date: 12/17/2017 CLINICAL DATA:  Acute onset of generalized weakness and lethargy. Mild hypoxia. Productive cough and malaise. EXAM: CHEST - 2 VIEW COMPARISON:  Chest radiograph performed 05/17/2017 FINDINGS: The lungs are well-aerated and clear. There is no evidence of focal opacification, pleural effusion or pneumothorax. The heart is normal in size; the mediastinal contour is within normal limits. No acute osseous abnormalities are seen. Right humeral hardware is incompletely imaged but appears grossly unremarkable, transfixing the right humeral diaphyseal fracture. A right-sided chest port is noted ending about the mid to distal SVC. IMPRESSION: No acute cardiopulmonary process seen. Electronically Signed   By: Garald Balding M.D.   On: 12/17/2017 22:30   Dg Abd 1 View  Result Date: 12/18/2017 CLINICAL DATA:  Acute onset of generalized abdominal fullness. EXAM:  ABDOMEN - 1 VIEW COMPARISON:  PET/CT performed 11/22/2017 FINDINGS: The visualized bowel gas pattern is unremarkable. Scattered air and stool filled loops of colon are seen; no abnormal dilatation of small bowel loops is seen to suggest small bowel obstruction. No free intra-abdominal air is identified, though evaluation for free air is limited on a single supine view. The visualized osseous structures are within normal limits; the sacroiliac joints are unremarkable in appearance. The visualized lung bases are essentially clear. IMPRESSION: Unremarkable bowel gas pattern; no free intra-abdominal air seen. Moderate amount of stool noted in the colon. Electronically Signed   By: Garald Balding M.D.   On: 12/18/2017 01:46    Scheduled Meds: . allopurinol  300 mg Oral Daily  . aspirin EC  81 mg Oral Daily  . citalopram  40 mg Oral Daily  . guaiFENesin  600 mg Oral BID  . ipratropium-albuterol  3 mL Nebulization QID  . mometasone-formoterol  2 puff Inhalation BID  . oseltamivir  30 mg Oral BID  . pantoprazole  40 mg Oral Daily  . sucralfate  1 g Oral TID WC & HS  . tamsulosin  0.4 mg Oral QPC supper   Continuous Infusions: . sodium chloride 75 mL/hr at 12/18/17 0524  . ceFEPime (MAXIPIME) IV Stopped (12/19/17 0200)  . vancomycin Stopped (12/19/17 0300)    Active Problems:   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HCC)   COPD GOLD II   Neutropenic fever (HCC)   Thrombocytopenia (HCC)   Abdominal fullness   Acute urinary retention    Time spent: >35 minutes     Kinnie Feil  [facility] Pager [phone]. If 7PM-7AM, please contact night-coverage at [email or web address], password [facility] 12/19/2017, 9:33 AM  LOS: 1 day

## 2017-12-19 NOTE — Evaluation (Signed)
Occupational Therapy Evaluation Patient Details Name: Brooke Washington MRN: [id number] DOB: [birth year] Today's Date: 12/19/2017    History of Present Illness 77 y.o. female with PMH of recurrent lymphoma admitted  on 12/17/2017 with fevers, lethargy and altered mentation, found to have influenza A and urinary retention, Patient has recent pathological fracture of R humerus, s/p R humerus IM nail 11/23/17   Clinical Impression   Pt admitted with flu and urinary retention. Pt currently with functional limitations due to the deficits listed below (see OT Problem List).  Pt will benefit from skilled OT to increase their safety and independence with ADL and functional mobility for ADL to facilitate discharge to venue listed below.      Follow Up Recommendations  No OT follow up    Equipment Recommendations  None recommended by OT    Recommendations for Other Services       Precautions / Restrictions Precautions Precautions: Fall Precaution Comments: do not pull on the right arm      Mobility Bed Mobility Overal bed mobility: Needs Assistance Bed Mobility: Supine to Sit     Supine to sit: Mod assist     General bed mobility comments: unable to push up on right UE  Transfers Overall transfer level: Needs assistance Equipment used: 1 person hand held assist Transfers: Sit to/from Stand;Stand Pivot Transfers Sit to Stand: Mod assist Stand pivot transfers: Mod assist       General transfer comment: steady assist to rise holding left arm, Pivot to [facility],, then to BED then to recliner. HR sustaining in high 120's so did not attempt ambulation, Patient is on 3 liters with sats at 100%    Balance Overall balance assessment: Needs assistance Sitting-balance support: No upper extremity supported;Feet supported Sitting balance-Leahy Scale: Good     Standing balance support: During functional activity;Single extremity supported Standing balance-Leahy Scale: Poor                             ADL either performed or assessed with clinical judgement   ADL Overall ADL's : Needs assistance/impaired Eating/Feeding: Set up;Sitting   Grooming: Set up;Sitting   Upper Body Bathing: Set up;Sitting   Lower Body Bathing: Minimal assistance;Sit to/from stand;Cueing for safety;Cueing for sequencing   Upper Body Dressing : Set up;Sitting   Lower Body Dressing: Minimal assistance;Sit to/from stand;Cueing for sequencing                 General ADL Comments: pt agreed to get to chair.  Pts BP 113/68. HR 78     Vision Patient Visual Report: No change from baseline              Pertinent Vitals/Pain Pain Assessment: No/denies pain     Hand Dominance     Extremity/Trunk Assessment Upper Extremity Assessment Upper Extremity Assessment: Generalized weakness   Lower Extremity Assessment Lower Extremity Assessment: Generalized weakness   Cervical / Trunk Assessment Cervical / Trunk Assessment: Kyphotic   Communication Communication Communication: No difficulties   Cognition Arousal/Alertness: Awake/alert Behavior During Therapy: WFL for tasks assessed/performed Overall Cognitive Status: Within Functional Limits for tasks assessed                                                Home Living Family/patient expects to be discharged to:: Private residence  Living Arrangements: Spouse/significant other Available Help at Discharge: Family;Available 24 hours/day   Home Access: Level entry     Home Layout: Two level;Able to live on main level with bedroom/bathroom     Bathroom Shower/Tub: Walk-in shower         Home Equipment: Kasandra Knudsen - single point;Toilet riser   Additional Comments: pt's daughter works for [employer].  No seat in shower      Prior Functioning/Environment Level of Independence: Independent                 OT Problem List: Decreased strength;Impaired balance (sitting and/or standing);Decreased  coordination;Decreased knowledge of use of DME or AE;Decreased activity tolerance;Decreased safety awareness      OT Treatment/Interventions: Self-care/ADL training;Patient/family education;DME and/or AE instruction    OT Goals(Current goals can be found in the care plan section) Acute Rehab OT Goals Patient Stated Goal: to go home OT Goal Formulation: With patient Time For Goal Achievement: 01/02/18  OT Frequency: Min 2X/week   Barriers to D/C:            Co-evaluation              AM-PAC PT "6 Clicks" Daily Activity     Outcome Measure Help from another person eating meals?: None Help from another person taking care of personal grooming?: A Little Help from another person toileting, which includes using toliet, bedpan, or urinal?: A Little Help from another person bathing (including washing, rinsing, drying)?: A Little Help from another person to put on and taking off regular upper body clothing?: A Little Help from another person to put on and taking off regular lower body clothing?: A Little 6 Click Score: 19   End of Session Nurse Communication: Mobility status  Activity Tolerance: Patient tolerated treatment well Patient left: in chair;with call bell/phone within reach;with chair alarm set;with family/visitor present  OT Visit Diagnosis: Unsteadiness on feet (R26.81);Muscle weakness (generalized) (M62.81)                Time: 4163-8453 OT Time Calculation (min): 14 min Charges:  OT General Charges $OT Visit: 1 Visit OT Evaluation $OT Eval Moderate Complexity: 1 Mod G-Codes:     Kari Baars, [employer]  Payton Mccallum D 12/19/2017, 2:48 PM

## 2017-12-19 NOTE — Evaluation (Signed)
Physical Therapy Evaluation Patient Details Name: Brooke Washington MRN: [id number] DOB: 1940/11/16 Today's Date: 12/19/2017   History of Present Illness  77 y.o. female with PMH of recurrent lymphoma admitted  on 12/17/2017 with fevers, lethargy and altered mentation, found to have influenza A and urinary retention, Patient has recent pathological fracture of R humerus, s/p R humerus IM nail 11/23/17  Clinical Impression  The patient is ready to mobilize, HR in high 120's with minimal activity. Recently received a breathing treatment. Limited mobility to recliner. Pt admitted with above diagnosis. Pt currently with functional limitations due to the deficits listed below (see PT Problem List).  Pt will benefit from skilled PT to increase their independence and safety with mobility to allow discharge to the venue listed below.       Follow Up Recommendations Home health PT    Equipment Recommendations  Rolling walker with 5" wheels    Recommendations for Other Services       Precautions / Restrictions Precautions Precautions: Fall Precaution Comments: do not pull on the right arm      Mobility  Bed Mobility Overal bed mobility: Needs Assistance Bed Mobility: Supine to Sit     Supine to sit: Mod assist     General bed mobility comments: unable to push up on right UE  Transfers Overall transfer level: Needs assistance Equipment used: 1 person hand held assist Transfers: Sit to/from Stand;Stand Pivot Transfers Sit to Stand: Mod assist Stand pivot transfers: Mod assist       General transfer comment: steady assist to rise holding left arm, Pivot to [facility],, then to BED then to recliner. HR sustaining in high 120's so did not attempt ambulation, Patient is on 3 liters with sats at 100%  Ambulation/Gait                Stairs            Wheelchair Mobility    Modified Rankin (Stroke Patients Only)       Balance Overall balance assessment: Needs  assistance Sitting-balance support: No upper extremity supported;Feet supported Sitting balance-Leahy Scale: Good     Standing balance support: During functional activity;Single extremity supported Standing balance-Leahy Scale: Poor                               Pertinent Vitals/Pain      Home Living Family/patient expects to be discharged to:: Private residence Living Arrangements: Spouse/significant other Available Help at Discharge: Family;Available 24 hours/day   Home Access: Level entry     Home Layout: Two level;Able to live on main level with bedroom/bathroom Home Equipment: Kasandra Knudsen - single point;Toilet riser Additional Comments: pt's daughter works for [employer].  No seat in shower    Prior Function Level of Independence: Independent               Journalist, newspaper        Extremity/Trunk Assessment   Upper Extremity Assessment Upper Extremity Assessment: Defer to OT evaluation    Lower Extremity Assessment Lower Extremity Assessment: Generalized weakness    Cervical / Trunk Assessment Cervical / Trunk Assessment: Kyphotic  Communication   Communication: No difficulties  Cognition Arousal/Alertness: Awake/alert Behavior During Therapy: WFL for tasks assessed/performed Overall Cognitive Status: Within Functional Limits for tasks assessed  General Comments      Exercises     Assessment/Plan    PT Assessment Patient needs continued PT services  PT Problem List Decreased strength;Decreased range of motion;Decreased activity tolerance;Decreased safety awareness;Decreased knowledge of use of DME;Decreased balance;Decreased knowledge of precautions;Decreased mobility       PT Treatment Interventions DME instruction;Therapeutic exercise;Gait training;Balance training;Stair training;Functional mobility training;Therapeutic activities;Patient/family education    PT Goals (Current  goals can be found in the Care Plan section)  Acute Rehab PT Goals Patient Stated Goal: to go home PT Goal Formulation: With patient/family Time For Goal Achievement: 01/02/18 Potential to Achieve Goals: Good    Frequency Min 3X/week   Barriers to discharge        Co-evaluation               AM-PAC PT "6 Clicks" Daily Activity  Outcome Measure Difficulty turning over in bed (including adjusting bedclothes, sheets and blankets)?: A Lot Difficulty moving from lying on back to sitting on the side of the bed? : A Lot Difficulty sitting down on and standing up from a chair with arms (e.g., wheelchair, bedside commode, etc,.)?: A Lot Help needed moving to and from a bed to chair (including a wheelchair)?: A Lot Help needed walking in hospital room?: A Lot Help needed climbing 3-5 steps with a railing? : Total 6 Click Score: 11    End of Session   Activity Tolerance: Treatment limited secondary to medical complications (Comment) Patient left: in chair;with call bell/phone within reach;with chair alarm set Nurse Communication: Mobility status PT Visit Diagnosis: Unsteadiness on feet (R26.81)    Time: 7902-4097 PT Time Calculation (min) (ACUTE ONLY): 37 min   Charges:   PT Evaluation $PT Eval Low Complexity: 1 Low PT Treatments $Therapeutic Activity: 8-22 mins   PT G CodesTresa Endo PT [phone]   Claretha Cooper 12/19/2017, 12:52 PM

## 2017-12-19 NOTE — Progress Notes (Signed)
Initial Nutrition Assessment  DOCUMENTATION CODES:   Not applicable  INTERVENTION:  Ensure Enlive po BID between meals, each supplement provides 350 kcal and 20 grams of protein  Magic Cup BID at meals, each supplement provides 290 kcal and 9 grams of protein   NUTRITION DIAGNOSIS:   Increased nutrient needs related to cancer and cancer related treatments as evidenced by estimated needs.  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  Spoke with pt and pt's husband:  -Pt had good appetite and no issues with eating prior to starting chemo on 3/1 -Currently denies symptoms of N/V/D; no discomfort with BM; however constipation has decreased frequency of BM (pt's husband attributes this to pain medications) -Denies any pain/discomfort swallowing -Pt appetite decreased ~50% d/t early satiety/fullness when chemo started; this was accompanied by weight loss of ~7lbs in the past 9 days (5%). -Prior to chemo therapy, pt denies any history of weight loss and has maintained a stable weight -Pt is not enjoying food in hospital, currently on heart healthy diet; may consider liberalizing.  -Denies taste changes impacting current PO intake; however, less interested in meats and other animal protein sources -Dietary recall PTA; Pt's husband prepares all meals: --Breakfast: Fruit (used to have egg & toast prior to chemo) --Lunch: Sandwich (only eats half since chemo started) --[employer] or beef soup  --Beverage: only drinks water Snacks-- currently likes [employer] smoothies as a  treat   Pt and pt's husband have been trying to increase her PO intake of high protein/calorie food. Pt has tried Ensure in the past and is willing to consume in hospital (preferred flavor chocolate). Encouraged pt to continue to eat high-cal/pro as tolerated and provided suggestions from hospital menu.  Medical record indicates 7% weight loss in past 13 days. Pt does not currently meet criteria for malnutrition; however, she is at risk   Wt Readings from Last 15 Encounters:  12/15/17 127 lb 3.2 oz (57.7 kg)  12/12/17 131 lb 6.4 oz (59.6 kg)  11/29/17 137 lb 4.8 oz (62.3 kg)  11/23/17 131 lb 3.2 oz (59.5 kg)  11/22/17 133 lb 1.6 oz (60.4 kg)  11/21/17 134 lb 1.6 oz (60.8 kg)  11/15/17 130 lb (59 kg)  10/12/17 133 lb (60.3 kg)  08/16/17 131 lb (59.4 kg)  07/05/17 133 lb 3.2 oz (60.4 kg)  05/24/17 133 lb (60.3 kg)  05/17/17 133 lb (60.3 kg)  04/30/16 128 lb 3.2 oz (58.2 kg)  01/27/16 125 lb 8 oz (56.9 kg)  12/29/15 130 lb 1.6 oz (59 kg)      Medications:  Protonix Senokot NaCl @ 71ml/hr Vancomycin   Labs:  Na 135 WNL K 3.3 L Phos 2.2 L Mg 1.7 WNL I/O 1901-0700am +1,365    NUTRITION - FOCUSED PHYSICAL EXAM:    Most Recent Value  Orbital Region  Mild depletion  Upper Arm Region  Mild depletion  Thoracic and Lumbar Region  No depletion  Buccal Region  Mild depletion  Temple Region  Mild depletion  Clavicle Bone Region  Mild depletion  Clavicle and Acromion Bone Region  Mild depletion  Scapular  Bone Region  No depletion  Dorsal Hand  No depletion  Patellar Region  No depletion  Anterior Thigh Region  No depletion  Posterior Calf Region  No depletion  Edema (RD Assessment)  None  Hair  Reviewed  Eyes  Reviewed  Mouth  Reviewed  Skin  Reviewed  Nails  Reviewed       Diet Order:  Diet regular Room service appropriate? Yes; Fluid consistency: Thin  EDUCATION NEEDS:   Education needs have been addressed  Skin:  Skin Assessment: Skin Integrity  Issues: Skin Integrity Issues:: Incisions Incisions: closed incision rt arm  Last BM:  3/9  Height:   Ht Readings from Last 1 Encounters:  12/15/17 5\' 4"  (1.626 m)    Weight:   Wt Readings from Last 1 Encounters:  12/15/17 127 lb 3.2 oz (57.7 kg)    Ideal Body Weight:  54.5 kg  BMI:  21.8 kg/m2  Estimated Nutritional Needs:   Kcal:  2020-2120 (>=35 g/kg)  Protein:  70-85 grams (1.2-1.5 g/kg)  Fluid:  >=2.0 L    Edmonia Lynch, Dietetic Intern Pager: [phone]

## 2017-12-19 NOTE — Progress Notes (Signed)
[facility]  Patient Status: Active (receiving services up to time of hospitalization)  [facility] is providing the following services: PT and OT  If patient discharges after hours, please call [phone].   Edwinna Areola 12/19/2017, 9:59 AM

## 2017-12-19 NOTE — Telephone Encounter (Signed)
Pt has been admitted.  

## 2017-12-19 NOTE — Telephone Encounter (Signed)
-----   Message from [name], MD sent at 12/19/2017  8:53 AM EDT ----- Regarding: mychart message PLs call her husband for update. Tachycardia could mean dehydration and abnormal oxygenation will need to be addressed if still persistent

## 2017-12-20 ENCOUNTER — Telehealth: Payer: Self-pay | Admitting: Hematology and Oncology

## 2017-12-20 ENCOUNTER — Other Ambulatory Visit: Payer: Self-pay | Admitting: Hematology and Oncology

## 2017-12-20 DIAGNOSIS — D61818 Other pancytopenia: Secondary | ICD-10-CM

## 2017-12-20 LAB — CBC WITH DIFFERENTIAL/PLATELET
BASOS PCT: 0 %
Basophils Absolute: 0 10*3/uL (ref 0.0–0.1)
EOS PCT: 1 %
Eosinophils Absolute: 0 10*3/uL (ref 0.0–0.7)
HEMATOCRIT: 24.2 % — AB (ref 36.0–46.0)
Hemoglobin: 8.2 g/dL — ABNORMAL LOW (ref 12.0–15.0)
LYMPHS ABS: 0.2 10*3/uL — AB (ref 0.7–4.0)
Lymphocytes Relative: 7 %
MCH: 33.2 pg (ref 26.0–34.0)
MCHC: 33.9 g/dL (ref 30.0–36.0)
MCV: 98 fL (ref 78.0–100.0)
MONOS PCT: 15 %
Monocytes Absolute: 0.5 10*3/uL (ref 0.1–1.0)
Neutro Abs: 2.5 10*3/uL (ref 1.7–7.7)
Neutrophils Relative %: 77 %
PLATELETS: 70 10*3/uL — AB (ref 150–400)
RBC: 2.47 MIL/uL — AB (ref 3.87–5.11)
RDW: 14.7 % (ref 11.5–15.5)
WBC: 3.2 10*3/uL — AB (ref 4.0–10.5)

## 2017-12-20 MED ORDER — ALBUTEROL SULFATE HFA 108 (90 BASE) MCG/ACT IN AERS: 2.0000 | INHALATION_SPRAY | Freq: Four times a day (QID) | RESPIRATORY_TRACT | 2 refills | Status: DC | PRN

## 2017-12-20 MED ORDER — ACETAMINOPHEN 325 MG PO TABS: 650.0000 mg | ORAL_TABLET | Freq: Four times a day (QID) | ORAL | Status: DC | PRN

## 2017-12-20 MED ORDER — OSELTAMIVIR PHOSPHATE 30 MG PO CAPS: 30.0000 mg | ORAL_CAPSULE | Freq: Two times a day (BID) | ORAL | 0 refills | Status: DC

## 2017-12-20 MED ORDER — HEPARIN SOD (PORK) LOCK FLUSH 100 UNIT/ML IV SOLN
500.0000 [IU] | Freq: Once | INTRAVENOUS | Status: AC
  Administered 2017-12-20: 500 [IU]
  Filled 2017-12-20: qty 5

## 2017-12-20 NOTE — Progress Notes (Signed)
Pt was active with [facility] for HHPT/OT prior to admission. Per inpatient PT/OT evals, HHPT is still recommended. MD order requested for HHPT. Marney Doctor RN,BSN,NCM [phone]

## 2017-12-20 NOTE — Discharge Summary (Signed)
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  History of present illness:   77 year old female with past medical history relevant forLarge cell lymphomaon currently chemotherapy, COPD, hyperlipidemia, GERD,last chemoRX 12/09/17 and hadPEGFILGRASTIMon 4th ofMarch admitted on 12/17/2017 with fevers, lethargy and altered mentation, found to have influenza A and urinary retention.    Hospital Course:   Influenza A-improving with Tamiflu, bronchodilators as ordered, received gentle hydration  Pancytopenia-suspect this is chemo induced, last chemoRX 12/09/17 and hadPEGFILGRASTIMon 4th ofMarch -resolved   NeutropenicFevers-fever likely due to influenza. neutropenia is secondary to chemotherapy as noted above #2, fpatient receivedPEGFILGRASTIMon 4th ofMarch. Patient received vancomycin and cefepime. No s/s of sepsis, blood cultures from 12/18/2017: NGTD. Discontinued antibiotcs  -resolved. D/w Dr. Alvy Bimler   patient clinically is not septic, neutropenia can be explained as above by chemotherapy, fevers can be explained by influenza, tachycardia can be explained by combination of fevers and volume depletion from poor oral intake in the setting of chemotherapy and influenza.Elevated lactic acid is due to dehydration, poor tissue perfusion in the setting of volume contraction, decreased oral intake/dehydration,even though  pt meets SIRS criteria,she is clinically not septic, pro calcitonin is not elevated. Explanations for SIRS components as above.   Urinary Retention-. Resolved     Procedures:  noen (i.e. Studies not automatically included, echos, thoracentesis, etc; not x-rays)  Consultations:  Oncology   Discharge Exam: Vitals:   12/20/17 0517 12/20/17 0909  BP: 124/70   Pulse: 94   Resp: 12   Temp: 98.2 F (36.8 C)   SpO2: 96% 95%    General: no distress  Cardiovascular: s1,s2 rrr Respiratory: no wheezing   Discharge Instructions  Discharge Instructions    Diet - low sodium heart healthy   Complete by:  As directed    Increase activity slowly   Complete by:  As directed      Allergies as of 12/20/2017      Reactions   Contrast Media [iodinated Diagnostic Agents] Shortness Of Breath, Rash, Other (See Comments)   Throat swells   Iohexol Shortness Of Breath   Povidone-iodine Hives, Shortness Of Breath, Rash   REACTION: throat swells   Penicillins Hives, Itching, Rash   Has patient had a PCN reaction causing immediate rash, facial/tongue/throat swelling, SOB or lightheadedness with hypotension: Unknown Has patient had a PCN reaction causing severe rash involving mucus membranes or skin necrosis: Yes Has patient had a PCN reaction that required hospitalization: Unknown Has patient had a PCN reaction occurring within the last 10 years: Unknown If all of the above answers are "NO", then may proceed with Cephalosporin use.   Adhesive [tape] Hives      Medication List    STOP taking these medications   levofloxacin 500 MG tablet Commonly known as:  LEVAQUIN     TAKE these medications   acetaminophen 325 MG tablet Commonly known as:  TYLENOL Take 2 tablets (650 mg total) by mouth  every 6 (six) hours as needed for mild pain (or Fever >/= 101).   albuterol 108 (90 Base) MCG/ACT inhaler Commonly known as:  PROVENTIL HFA;VENTOLIN HFA Inhale 2 puffs into the lungs every 6  (six) hours as needed for wheezing or shortness of breath.   allopurinol 300 MG tablet Commonly known as:  ZYLOPRIM Take 1 tablet (300 mg total) by mouth daily.   aspirin 81 MG tablet Take 81 mg by mouth daily.   citalopram 40 MG tablet Commonly known as:  CELEXA Take 1 tablet (40 mg total) by mouth daily.   fluticasone furoate-vilanterol 100-25 MCG/INH Aepb Commonly known as:  BREO ELLIPTA Inhale 1 puff into the lungs every morning.   HYDROcodone-acetaminophen 5-325 MG tablet Commonly known as:  NORCO/VICODIN Take 1 tablet by mouth every 4 (four) hours as needed for moderate pain. What changed:  how much to take   ibuprofen 200 MG tablet Commonly known as:  ADVIL,MOTRIN Take 200 mg by mouth every 6 (six) hours as needed for moderate pain.   lidocaine-prilocaine cream Commonly known as:  EMLA Apply to affected area once   magic mouthwash w/lidocaine Soln Take 10 mLs by mouth 4 (four) times daily. Swish and swallow 10 ml QID What changed:    when to take this  reasons to take this  additional instructions   ondansetron 8 MG tablet Commonly known as:  ZOFRAN Take 1 tablet (8 mg total) by mouth every 8 (eight) hours as needed for nausea.   oseltamivir 30 MG capsule Commonly known as:  TAMIFLU Take 1 capsule (30 mg total) by mouth 2 (two) times daily.   pantoprazole 40 MG tablet Commonly known as:  PROTONIX Take 1 tablet (40 mg total) by mouth daily.   predniSONE 20 MG tablet Commonly known as:  DELTASONE Take 2 tablets (40 mg total) by mouth daily. Start 40 mg daily on 2/23. After chemo, take 40 mg for 4 days   prochlorperazine 10 MG tablet Commonly known as:  COMPAZINE Take 1 tablet (10 mg total) by mouth every 6 (six) hours as needed (Nausea or vomiting).   senna 8.6 MG Tabs tablet Commonly known as:  SENOKOT Take 2 tablets (17.2 mg total) by mouth daily as needed for mild constipation.      Allergies  Allergen Reactions  . Contrast Media [Iodinated  Diagnostic Agents] Shortness Of Breath, Rash and Other (See Comments)    Throat swells  . Iohexol Shortness Of Breath  . Povidone-Iodine Hives, Shortness Of Breath and Rash    REACTION: throat swells  . Penicillins Hives, Itching and Rash    Has patient had a PCN reaction causing immediate rash, facial/tongue/throat swelling, SOB or lightheadedness with hypotension: Unknown Has patient had a PCN reaction causing severe rash involving mucus membranes or skin necrosis: Yes Has patient had a PCN reaction that required hospitalization: Unknown Has patient had a PCN reaction occurring within the last 10 years: Unknown If all of the above answers are "NO", then may proceed with Cephalosporin use.   . Adhesive [Tape] Hives      The results of significant diagnostics from this hospitalization (including imaging, microbiology, ancillary and laboratory) are listed below for reference.    Significant Diagnostic Studies: Dg Chest 2 View  Result Date: 12/17/2017 CLINICAL DATA:  Acute onset of generalized weakness and lethargy. Mild hypoxia. Productive cough and malaise. EXAM: CHEST - 2 VIEW COMPARISON:  Chest radiograph performed 05/17/2017 FINDINGS: The lungs are well-aerated and clear. There is no evidence  of focal opacification, pleural effusion or pneumothorax. The heart is normal in size; the mediastinal contour is within normal limits. No acute osseous abnormalities are seen. Right humeral hardware is incompletely imaged but appears grossly unremarkable, transfixing the right humeral diaphyseal fracture. A right-sided chest port is noted ending about the mid to distal SVC. IMPRESSION: No acute cardiopulmonary process seen. Electronically Signed   By: Garald Balding M.D.   On: 12/17/2017 22:30   Dg Abd 1 View  Result Date: 12/18/2017 CLINICAL DATA:  Acute onset of generalized abdominal fullness. EXAM: ABDOMEN - 1 VIEW COMPARISON:  PET/CT performed 11/22/2017 FINDINGS: The visualized bowel gas  pattern is unremarkable. Scattered air and stool filled loops of colon are seen; no abnormal dilatation of small bowel loops is seen to suggest small bowel obstruction. No free intra-abdominal air is identified, though evaluation for free air is limited on a single supine view. The visualized osseous structures are within normal limits; the sacroiliac joints are unremarkable in appearance. The visualized lung bases are essentially clear. IMPRESSION: Unremarkable bowel gas pattern; no free intra-abdominal air seen. Moderate amount of stool noted in the colon. Electronically Signed   By: Garald Balding M.D.   On: 12/18/2017 01:46   Nm Pet Image Restag (ps) Skull Base To Thigh  Result Date: 11/22/2017 CLINICAL DATA:  Subsequent treatment strategy for non-Hodgkin lymphoma. EXAM: NUCLEAR MEDICINE PET SKULL BASE TO THIGH TECHNIQUE: 6.7 mCi F-18 FDG was injected intravenously. Full-ring PET imaging was performed from the skull base to thigh after the radiotracer. CT data was obtained and used for attenuation correction and anatomic localization. FASTING BLOOD GLUCOSE:  Value: 95 mg/dl COMPARISON:  CT on 09/16/2014 and PET-CT on 09/09/2009 FINDINGS: NECK:  No hypermetabolic lymph nodes or masses. CHEST: New Sub-cm hypermetabolic lymph nodes are seen in the right internal mammary chain and along the posterior wall the right mainstem bronchus, with SUV max of 11.1. New hypermetabolic activity is also seen in the right hilum, with SUV max of 5.7. No suspicious pulmonary nodules are seen on CT images. ABDOMEN/PELVIS: New large hypermetabolic mass is seen in the posterior right hepatic lobe with SUV max of 33.5. No evidence of splenic involvement. A focal hypermetabolic lesion is seen lateral midpole of the left kidney which has SUV max of 18.5. This is suspicious for renal involvement by lymphoma, however primary renal cell carcinoma cannot be excluded. Hypermetabolic lymphadenopathy is seen in the right cardiophrenic  angle, porta hepatis, and abdominal retroperitoneum in the retrocaval, aortocaval and left paraaortic spaces. Index lymphadenopathy in the porta hepatis measures 3.7 cm in short axis on image 103/4, with SUV max of 30.3. No pelvic lymphadenopathy identified. Small hiatal hernia again noted. SKELETON: Hypermetabolic mass is seen at site of pathologic fracture in the proximal humeral diaphysis, which has SUV max of 17.6. A sub-cm hypermetabolic lymph node is seen along the lateral aspect of this mass in the adjacent subcutaneous tissues with SUV max of 5.7. Small foci of hypermetabolic activity are also seen in the right posterior chest wall in the intercostal space between the right posterior 10th and 11th ribs. IMPRESSION: New hypermetabolic lymphadenopathy within the chest and abdomen, consistent with recurrent lymphoma. New large hypermetabolic mass in the right hepatic lobe, consistent with lymphoma this involvement. New small hypermetabolic mass in the left kidney, which may be due to lymphomatous involvement although primary renal cell carcinoma cannot be excluded. Hypermetabolic mass in the right humerus with pathologic fracture, consistent with lymphomatous involvement. Small hypermetabolic foci in right  posterior chest wall, also suspicious for recurrent lymphoma. Electronically Signed   By: Earle Gell M.D.   On: 11/22/2017 10:47   US Biopsy (liver)  Result Date: 11/25/2017 INDICATION: 77 year old female with a history of lymphoma.  Liver mass EXAM: ULTRASOUND-GUIDED LIVER MASS BIOPSY MEDICATIONS: None. ANESTHESIA/SEDATION: Moderate (conscious) sedation was employed during this procedure. A total of Versed 1.0 mg and Fentanyl 50 mcg was administered intravenously. Moderate Sedation Time: 10 minutes. The patient's level of consciousness and vital signs were monitored continuously by radiology nursing throughout the procedure under my direct supervision. FLUOROSCOPY TIME:  NONE COMPLICATIONS: NONE  PROCEDURE: Informed written consent was obtained from the patient after a thorough discussion of the procedural risks, benefits and alternatives. All questions were addressed. Maximal Sterile Barrier Technique was utilized including caps, mask, sterile gowns, sterile gloves, sterile drape, hand hygiene and skin antiseptic. A timeout was performed prior to the initiation of the procedure. Patient positioned supine position on the ultrasound stretcher. Images were stored of the upper abdomen. Patient is prepped and draped in the usual sterile fashion. 1% lidocaine was used for local anesthesia. Using ultrasound guidance, guide needle was advanced to the liver mass. Multiple 18 gauge core biopsy were achieved. Patient tolerated the procedure well and remained hemodynamically stable throughout. No complications were encountered and no significant blood loss. IMPRESSION: Status post ultrasound-guided biopsy of liver mass. Tissue specimen sent to pathology for complete histopathologic analysis. Signed, Dulcy Fanny. Earleen Newport, DO [facility] Electronically Signed   By: Corrie Mckusick D.O.   On: 11/25/2017 09:26   Ir US Guide Vasc Access Right  Result Date: 12/07/2017 INDICATION: History of lymphoma. In need of durable intravenous access for chemotherapy administration. EXAM: IMPLANTED PORT A CATH PLACEMENT WITH ULTRASOUND AND FLUOROSCOPIC GUIDANCE COMPARISON:  PET-CT - 11/22/2016 MEDICATIONS: Clindamycin 600 mg IV; The antibiotic was administered within an appropriate time interval prior to skin puncture. ANESTHESIA/SEDATION: Moderate (conscious) sedation was employed during this procedure. A total of Versed 1 mg and Fentanyl 75 mcg was administered intravenously. Moderate Sedation Time: 30 minutes. The patient's level of consciousness and vital signs were monitored continuously by radiology nursing throughout the procedure under my direct supervision. CONTRAST:  None  FLUOROSCOPY TIME:  54 seconds (5 mGy) COMPLICATIONS: None immediate. PROCEDURE: The procedure, risks, benefits, and alternatives were explained to the patient. Questions regarding the procedure were encouraged and answered. The patient understands and consents to the procedure. The right neck and chest were prepped with chlorhexidine in a sterile fashion, and a sterile drape was applied covering the operative field. Maximum barrier sterile technique with sterile gowns and gloves were used for the procedure. A timeout was performed prior to the initiation of the procedure. Local anesthesia was provided with 1% lidocaine with epinephrine. After creating a small venotomy incision, a micropuncture kit was utilized to access the internal jugular vein. Real-time ultrasound guidance was utilized for vascular access including the acquisition of a permanent ultrasound image documenting patency of the accessed vessel. The microwire was utilized to measure appropriate catheter length. A subcutaneous port pocket was then created along the upper chest wall utilizing a combination of sharp and blunt dissection. The pocket was irrigated with sterile saline. A single lumen thin power injectable port was chosen for placement. The 8 Fr catheter was tunneled from the port pocket site to the venotomy incision. The port was placed in the pocket. The external catheter was trimmed to appropriate length. At the venotomy, an 8 Fr peel-away  sheath was placed over a guidewire under fluoroscopic guidance. The catheter was then placed through the sheath and the sheath was removed. Final catheter positioning was confirmed and documented with a fluoroscopic spot radiograph. The port was accessed with a Huber needle, aspirated and flushed with heparinized saline. The venotomy site was closed with an interrupted 4-0 Vicryl suture. The port pocket incision was closed with interrupted 2-0 Vicryl suture and the skin was opposed with a running  subcuticular 4-0 Vicryl suture. Dermabond and Steri-strips were applied to both incisions. Dressings were placed. The patient tolerated the procedure well without immediate post procedural complication. FINDINGS: After catheter placement, the tip lies within the superior cavoatrial junction. The catheter aspirates and flushes normally and is ready for immediate use. IMPRESSION: Successful placement of a right internal jugular approach power injectable Port-A-Cath. The catheter is ready for immediate use. Electronically Signed   By: Sandi Mariscal M.D.   On: 12/07/2017 15:33   Dg Humerus Right  Result Date: 11/23/2017 CLINICAL DATA:  The patient has undergone ORIF for a pathologic fracture through the junction of the proximal and mid thirds of the shaft of the right humerus. EXAM: RIGHT HUMERUS - 2+ VIEW COMPARISON:  Intraoperative fluoro spot radiographs of today's date FINDINGS: The patient has undergone placement of an intramedullary rod with securing screws for fixation of a fracture at the junction of the proximal and mid thirds of the right humerus. Alignment is now more nearly anatomic. The observed portions of the shoulder and elbow are grossly normal. IMPRESSION: No postprocedure complication following ORIF for a pathologic right humeral shaft fracture. Electronically Signed   By: David  Martinique M.D.   On: 11/23/2017 15:26   Dg Humerus Right  Result Date: 11/23/2017 CLINICAL DATA:  Lymphoma.  Pathologic right humeral fracture. EXAM: DG C-ARM 61-120 MIN; RIGHT HUMERUS - 2+ VIEW COMPARISON:  Radiographs 11/15/2017.  MRI 11/19/2017. FLUOROSCOPY TIME:  2 min and 30 sec. C-arm fluoroscopic images were obtained intraoperatively and submitted for post operative interpretation. Please see the performing provider's procedural report for the fluoroscopy time utilized. FINDINGS: Nine spot fluoroscopic images are submitted. Initial images demonstrate an angulated pathologic fracture of the proximal right humeral  diaphysis. Subsequent images demonstrate the placement of a right humeral intramedullary nail secured by 2 proximal and 1 distal interlocking screws. There is near anatomic reduction of the fracture. No complications identified. IMPRESSION: Near anatomic reduction of pathologic proximal right humeral fracture post intramedullary nailing. Electronically Signed   By: Richardean Sale M.D.   On: 11/23/2017 15:06   Dg C-arm 1-60 Min  Result Date: 11/23/2017 CLINICAL DATA:  Lymphoma.  Pathologic right humeral fracture. EXAM: DG C-ARM 61-120 MIN; RIGHT HUMERUS - 2+ VIEW COMPARISON:  Radiographs 11/15/2017.  MRI 11/19/2017. FLUOROSCOPY TIME:  2 min and 30 sec. C-arm fluoroscopic images were obtained intraoperatively and submitted for post operative interpretation. Please see the performing provider's procedural report for the fluoroscopy time utilized. FINDINGS: Nine spot fluoroscopic images are submitted. Initial images demonstrate an angulated pathologic fracture of the proximal right humeral diaphysis. Subsequent images demonstrate the placement of a right humeral intramedullary nail secured by 2 proximal and 1 distal interlocking screws. There is near anatomic reduction of the fracture. No complications identified. IMPRESSION: Near anatomic reduction of pathologic proximal right humeral fracture post intramedullary nailing. Electronically Signed   By: Richardean Sale M.D.   On: 11/23/2017 15:06   Ir Fluoro Guide Port Insertion Right  Result Date: 12/07/2017 INDICATION: History of lymphoma. In need of  durable intravenous access for chemotherapy administration. EXAM: IMPLANTED PORT A CATH PLACEMENT WITH ULTRASOUND AND FLUOROSCOPIC GUIDANCE COMPARISON:  PET-CT - 11/22/2016 MEDICATIONS: Clindamycin 600 mg IV; The antibiotic was administered within an appropriate time interval prior to skin puncture. ANESTHESIA/SEDATION: Moderate (conscious) sedation was employed during this procedure. A total of Versed 1 mg and  Fentanyl 75 mcg was administered intravenously. Moderate Sedation Time: 30 minutes. The patient's level of consciousness and vital signs were monitored continuously by radiology nursing throughout the procedure under my direct supervision. CONTRAST:  None FLUOROSCOPY TIME:  54 seconds (5 mGy) COMPLICATIONS: None immediate. PROCEDURE: The procedure, risks, benefits, and alternatives were explained to the patient. Questions regarding the procedure were encouraged and answered. The patient understands and consents to the procedure. The right neck and chest were prepped with chlorhexidine in a sterile fashion, and a sterile drape was applied covering the operative field. Maximum barrier sterile technique with sterile gowns and gloves were used for the procedure. A timeout was performed prior to the initiation of the procedure. Local anesthesia was provided with 1% lidocaine with epinephrine. After creating a small venotomy incision, a micropuncture kit was utilized to access the internal jugular vein. Real-time ultrasound guidance was utilized for vascular access including the acquisition of a permanent ultrasound image documenting patency of the accessed vessel. The microwire was utilized to measure appropriate catheter length. A subcutaneous port pocket was then created along the upper chest wall utilizing a combination of sharp and blunt dissection. The pocket was irrigated with sterile saline. A single lumen thin power injectable port was chosen for placement. The 8 Fr catheter was tunneled from the port pocket site to the venotomy incision. The port was placed in the pocket. The external catheter was trimmed to appropriate length. At the venotomy, an 8 Fr peel-away sheath was placed over a guidewire under fluoroscopic guidance. The catheter was then placed through the sheath and the sheath was removed. Final catheter positioning was confirmed and documented with a fluoroscopic spot radiograph. The port was accessed  with a Huber needle, aspirated and flushed with heparinized saline. The venotomy site was closed with an interrupted 4-0 Vicryl suture. The port pocket incision was closed with interrupted 2-0 Vicryl suture and the skin was opposed with a running subcuticular 4-0 Vicryl suture. Dermabond and Steri-strips were applied to both incisions. Dressings were placed. The patient tolerated the procedure well without immediate post procedural complication. FINDINGS: After catheter placement, the tip lies within the superior cavoatrial junction. The catheter aspirates and flushes normally and is ready for immediate use. IMPRESSION: Successful placement of a right internal jugular approach power injectable Port-A-Cath. The catheter is ready for immediate use. Electronically Signed   By: Sandi Mariscal M.D.   On: 12/07/2017 15:33    Microbiology: Recent Results (from the past 240 hour(s))  Culture, blood (routine x 2)     Status: None (Preliminary result)   Collection Time: 12/17/17 11:37 PM  Result Value Ref Range Status   Specimen Description   Final    BLOOD RIGHT WRIST Performed at [facility], [place] [address], [place], [place] [address]    Special Requests   Final    BOTTLES DRAWN AEROBIC AND ANAEROBIC Blood Culture adequate volume Performed at [facility] [address], [place], [place] [address]    Culture   Final    NO GROWTH 1 DAY Performed at [facility], [place] [address], [place], [place] [address]    Report  Status PENDING  Incomplete  Culture, blood (routine x 2)     Status: None (Preliminary result)   Collection Time: 12/18/17  4:31 AM  Result Value Ref Range Status   Specimen Description   Final    BLOOD LEFT ANTECUBITAL Performed at [facility] [address] [place], [place] [address]    Special Requests   Final    IN PEDIATRIC BOTTLE Blood Culture adequate volume Performed at [facility]  [address] [place], [place] [address]    Culture   Final    NO GROWTH < 24 HOURS Performed at [facility] [address], [place], [place] [address]    Report Status PENDING  Incomplete     Labs: Basic Metabolic Panel: Recent Labs  Lab 12/17/17 2202 12/18/17 0431 12/19/17 0500  NA 138 133* 135  K 4.0 3.6 3.3*  CL 99* 97* 103  CO2 29 27 26   GLUCOSE 117* 116* 104*  BUN 14 12 8   CREATININE 0.72 0.67 0.59  CALCIUM 9.3 8.4* 8.5*  MG  --  1.7  --   PHOS  --  2.2*  --    Liver Function Tests: Recent Labs  Lab 12/17/17 2202 12/18/17 0431  AST 16 13*  ALT 13* 13*  ALKPHOS 128* 106  BILITOT 0.3 0.5  PROT 6.5 5.7*  ALBUMIN 3.7 3.3*   No results for input(s): LIPASE, AMYLASE in the last 168 hours. No results for input(s): AMMONIA in the last 168 hours. CBC: Recent Labs  Lab 12/17/17 2202 12/18/17 0431 12/19/17 0500 12/20/17 0635  WBC 0.1* 0.2* 1.1* 3.2*  NEUTROABS 0.0*  --   --  2.5  HGB 14.0 9.7* 9.1* 8.2*  HCT 43.3 29.8* 27.3* 24.2*  MCV 99.3 99.0 98.6 98.0  PLT 57* 69* 59* 70*   Cardiac Enzymes: No results for input(s): CKTOTAL, CKMB, CKMBINDEX, TROPONINI in the last 168 hours. BNP: BNP (last 3 results) No results for input(s): BNP in the last 8760 hours.  ProBNP (last 3 results) No results for input(s): PROBNP in the last 8760 hours.  CBG: No results for input(s): GLUCAP in the last 168 hours.     SignedKinnie Feil  [facility] 12/20/2017, 9:17 AM

## 2017-12-20 NOTE — Progress Notes (Signed)
Pt and her spouse verbalized understanding of D/C instructions. She is d/c'd home with her husband

## 2017-12-20 NOTE — Progress Notes (Signed)
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 I recommend she continue on Tamiflu only for influenza A infection  Heart burn She have signs and symptoms to suggest severe reflux disease, likely exacerbated by recent steroid treatment She is on proton pump inhibitor along with sucralfate  Moderate protein calorie malnutrition, poor appetite She has been seen by dietitian.  Encourage oral intake  Constipation Recommend aggressive laxatives  Pancytopenia She is not symptomatic.  She does not need transfusion support  Discharge planning Overall, she is improving.  Hopefully she can be discharged today. We will attempt to wean her off oxygen I will arrange outpatient follow-up next week. We will sign off.  [name], MD 12/20/2017  8:11 AM   Subjective:  She feels better.  She has less cough.  She denies significant abdominal pain.  She has no bowel movement for 2 days.  She denies nausea.  She had good appetite last night.  The patient expressed desire to go home.  She is noted to be afebrile over the last 48 hours.  Objective:  Vitals:   12/19/17 2055 12/20/17 0517  BP:  124/70  Pulse:  94  Resp:  12  Temp:  98.2 F (36.8 C)  SpO2: 94% 96%     Intake/Output Summary (Last 24 hours) at 12/20/2017 4944 Last data filed at 12/20/2017 9675 Gross per 24 hour  Intake 1431.25 ml  Output 2101 ml  Net -669.75 ml    GENERAL:alert, no distress and comfortable SKIN: skin color, texture, turgor are normal, no rashes or significant lesions EYES: normal, Conjunctiva are pink and non-injected,  sclera clear OROPHARYNX:no exudate, no erythema and lips, buccal mucosa, and tongue normal  NECK: supple, thyroid normal size, non-tender, without nodularity LYMPH:  no palpable lymphadenopathy in the cervical, axillary or inguinal LUNGS: clear to auscultation and percussion with normal breathing effort HEART: regular rate & rhythm and no murmurs and no lower extremity edema ABDOMEN:abdomen soft, non-tender and normal bowel sounds Musculoskeletal:no cyanosis of digits and no clubbing  NEURO: alert & oriented x 3 with fluent speech, no focal motor/sensory deficits   Labs:  Lab Results  Component Value Date   WBC 3.2 (L) 12/20/2017   HGB 8.2 (L) 12/20/2017   HCT 24.2 (L) 12/20/2017   MCV 98.0 12/20/2017   PLT 70 (L) 12/20/2017   NEUTROABS 2.5 12/20/2017    Lab Results  Component Value Date   NA 135 12/19/2017   K 3.3 (L) 12/19/2017   CL 103 12/19/2017   CO2 26 12/19/2017

## 2017-12-20 NOTE — Telephone Encounter (Signed)
Spoke to patients husband regarding upcoming march appointments per 3/12 sch message.

## 2017-12-21 ENCOUNTER — Telehealth: Payer: Self-pay | Admitting: Adult Health

## 2017-12-21 NOTE — Telephone Encounter (Signed)
Copied from [place] [id number]. Topic: General - Other >> Dec 21, 2017  9:32 AM Margot Ables wrote: Pt was referred for [facility] therapy after recent hosp stay. Pt requested delay until 3/15. No need to call back.

## 2017-12-23 LAB — CULTURE, BLOOD (ROUTINE X 2)
CULTURE: NO GROWTH
CULTURE: NO GROWTH
Special Requests: ADEQUATE
Special Requests: ADEQUATE

## 2017-12-24 ENCOUNTER — Emergency Department (HOSPITAL_COMMUNITY): Payer: Medicare Other

## 2017-12-24 ENCOUNTER — Encounter (HOSPITAL_COMMUNITY): Payer: Self-pay

## 2017-12-24 ENCOUNTER — Emergency Department (HOSPITAL_COMMUNITY)
Admission: EM | Admit: 2017-12-24 | Discharge: 2017-12-25 | Disposition: A | Discharge status: Home / Self Care | Payer: Medicare Other | Attending: Emergency Medicine | Admitting: Emergency Medicine

## 2017-12-24 ENCOUNTER — Other Ambulatory Visit: Payer: Self-pay

## 2017-12-24 DIAGNOSIS — R0902 Hypoxemia: Secondary | ICD-10-CM

## 2017-12-24 DIAGNOSIS — K529 Noninfective gastroenteritis and colitis, unspecified: Secondary | ICD-10-CM | POA: Diagnosis not present

## 2017-12-24 DIAGNOSIS — N2889 Other specified disorders of kidney and ureter: Secondary | ICD-10-CM | POA: Diagnosis not present

## 2017-12-24 DIAGNOSIS — R1031 Right lower quadrant pain: Secondary | ICD-10-CM | POA: Diagnosis present

## 2017-12-24 DIAGNOSIS — K5900 Constipation, unspecified: Secondary | ICD-10-CM | POA: Diagnosis not present

## 2017-12-24 DIAGNOSIS — J449 Chronic obstructive pulmonary disease, unspecified: Secondary | ICD-10-CM | POA: Diagnosis not present

## 2017-12-24 DIAGNOSIS — Z87891 Personal history of nicotine dependence: Secondary | ICD-10-CM | POA: Diagnosis not present

## 2017-12-24 DIAGNOSIS — Z79899 Other long term (current) drug therapy: Secondary | ICD-10-CM | POA: Diagnosis not present

## 2017-12-24 DIAGNOSIS — R140 Abdominal distension (gaseous): Secondary | ICD-10-CM | POA: Diagnosis not present

## 2017-12-24 LAB — CBC
HEMATOCRIT: 32.2 % — AB (ref 36.0–46.0)
Hemoglobin: 10.1 g/dL — ABNORMAL LOW (ref 12.0–15.0)
MCH: 32 pg (ref 26.0–34.0)
MCHC: 31.4 g/dL (ref 30.0–36.0)
MCV: 101.9 fL — ABNORMAL HIGH (ref 78.0–100.0)
PLATELETS: 521 10*3/uL — AB (ref 150–400)
RBC: 3.16 MIL/uL — ABNORMAL LOW (ref 3.87–5.11)
RDW: 15.8 % — AB (ref 11.5–15.5)
WBC: 13.3 10*3/uL — AB (ref 4.0–10.5)

## 2017-12-24 LAB — COMPREHENSIVE METABOLIC PANEL
ALK PHOS: 196 U/L — AB (ref 38–126)
ALT: 19 U/L (ref 14–54)
AST: 27 U/L (ref 15–41)
Albumin: 3.5 g/dL (ref 3.5–5.0)
Anion gap: 10 (ref 5–15)
BILIRUBIN TOTAL: 0.5 mg/dL (ref 0.3–1.2)
BUN: 16 mg/dL (ref 6–20)
CALCIUM: 8.9 mg/dL (ref 8.9–10.3)
CO2: 32 mmol/L (ref 22–32)
Chloride: 99 mmol/L — ABNORMAL LOW (ref 101–111)
Creatinine, Ser: 0.85 mg/dL (ref 0.44–1.00)
GFR calc Af Amer: >60 mL/min (ref >60)
Glucose, Bld: 105 mg/dL — ABNORMAL HIGH (ref 65–99)
Potassium: 3 mmol/L — ABNORMAL LOW (ref 3.5–5.1)
Sodium: 141 mmol/L (ref 135–145)
TOTAL PROTEIN: 6.4 g/dL — AB (ref 6.5–8.1)

## 2017-12-24 LAB — LIPASE, BLOOD: Lipase: 19 U/L (ref 11–51)

## 2017-12-24 MED ORDER — LACTULOSE 10 GM/15ML PO SOLN: 20.0000 g | Freq: Once | ORAL | Status: DC

## 2017-12-24 MED ORDER — CIPROFLOXACIN HCL 500 MG PO TABS: 500.0000 mg | ORAL_TABLET | Freq: Two times a day (BID) | ORAL | 0 refills | Status: DC

## 2017-12-24 MED ORDER — SODIUM CHLORIDE 0.9 % IV BOLUS (SEPSIS)
1000.0000 mL | Freq: Once | INTRAVENOUS | Status: AC
  Administered 2017-12-24: 1000 mL via INTRAVENOUS

## 2017-12-24 MED ORDER — CIPROFLOXACIN HCL 500 MG PO TABS
500.0000 mg | ORAL_TABLET | Freq: Once | ORAL | Status: AC
  Administered 2017-12-25: 500 mg via ORAL
  Filled 2017-12-24: qty 1

## 2017-12-24 MED ORDER — PEG 3350-KCL-NABCB-NACL-NASULF 236 G PO SOLR: 4.0000 L | Freq: Once | ORAL | 0 refills | Status: AC

## 2017-12-24 MED ORDER — FLEET ENEMA 7-19 GM/118ML RE ENEM
1.0000 | ENEMA | Freq: Once | RECTAL | Status: AC
  Administered 2017-12-24: 1 via RECTAL
  Filled 2017-12-24: qty 1

## 2017-12-24 MED ORDER — POTASSIUM CHLORIDE CRYS ER 20 MEQ PO TBCR
40.0000 meq | EXTENDED_RELEASE_TABLET | Freq: Once | ORAL | Status: AC
  Administered 2017-12-24: 40 meq via ORAL
  Filled 2017-12-24: qty 2

## 2017-12-24 NOTE — ED Triage Notes (Signed)
Pt comes from home. Pt is AOx4. Pt is ambulatory Pt is currently going through chemo for lymphoma. Pt was diagnosed with the flu on the 9th of march and patients last bowel movement was march the 9th. Pt states that her stomach feels sore.

## 2017-12-24 NOTE — Discharge Instructions (Signed)
Stay hydrated.   Take golytely to help you have bowel movement.   Take cipro twice daily for a week   Follow up with your doctor and GI doctor   Your oxygen is slightly low, please tell your doctor and oncologist about this.   Return to ER if you have worse abdominal pain, vomiting, fever.

## 2017-12-24 NOTE — ED Notes (Signed)
Patient transported to CT 

## 2017-12-24 NOTE — ED Provider Notes (Addendum)
[facility] DEPT Provider Note   CSN: [id number] Arrival date & time: 12/24/17  1529     History   Chief Complaint Chief Complaint  Patient presents with  . Constipation    Cancer Patient    HPI Brooke Washington is a 77 y.o. female history of non-Hodgkin's lymphoma, COPD here presenting with abdominal pain, constipation.  Patient states that she was recently admitted for neutropenia and flu and finished a course of Tamiflu.  She was discharged from the hospital about 4 days ago.  She states that her last bowel movement was about a week ago.  Yesterday and today, patient tried Dulcolax, MiraLAX and senna with no relief.  She states that she has some lower abdominal pain but denies any vomiting and is still passing gas.  Denies any fevers or chills.  She was concerned that she may have stool impaction.   The history is provided by the patient.    Past Medical History:  Diagnosis Date  . Cancer ([place])  APRIL OF 2000   LOW-GRADE NON-HODGKIN'S LYMPHOMA  . COPD (chronic obstructive pulmonary disease) ([place])   . Emphysema of lung ([place])   . Hyperlipidemia     Patient Active Problem List   Diagnosis Date Noted  . Neutropenic fever ([place]) 12/18/2017  . Thrombocytopenia ([place]) 12/18/2017  . Abdominal fullness 12/18/2017  . Acute urinary retention 12/18/2017  . Cough in adult 12/15/2017  . Mucositis due to chemotherapy 12/15/2017  . Heart burn 12/15/2017  . Frequent headaches 12/15/2017  . Chemotherapy-induced nausea 12/12/2017  . 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 Goals of care, counseling/discussion 11/29/2017  . Pathological fracture in neoplastic disease, right humerus, initial encounter for fracture 11/24/2017  . Cancer associated pain 11/22/2017  . Pathological fracture due to neoplastic disease, initial encounter 11/21/2017  . COPD GOLD II 07/05/2017  . COLONIC POLYPS, HYPERPLASTIC, HX OF 05/28/2010  . Large cell lymphoma of  multiple sites ([place]) 11/27/2007  . Depression 05/05/2007    Past Surgical History:  Procedure Laterality Date  . 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 IR FLUORO GUIDE PORT INSERTION RIGHT  12/07/2017  . IR US GUIDE VASC ACCESS RIGHT  12/07/2017  . ORIF HUMERUS FRACTURE Right 11/23/2017   Procedure: OPEN REDUCTION INTERNAL FIXATION (ORIF) HUMERAL SHAFT FRACTURE;  Surgeon: Shona Needles, MD;  Location: [facility];  Service: Orthopedics;  Laterality: Right;  . RIGHT OOPHORECTOMY  1976    OB History    No data available       Home Medications    Prior to Admission medications   Medication Sig Start Date End Date Taking? Authorizing Provider  acetaminophen (TYLENOL) 325 MG tablet Take 2 tablets (650 mg total) by mouth every 6 (six) hours as needed for mild pain (or Fever >/= 101). 12/20/17   Kinnie Feil, MD  albuterol (PROVENTIL HFA;VENTOLIN HFA) 108 (90 Base) MCG/ACT inhaler Inhale 2 puffs into the lungs every 6 (six) hours as needed for wheezing or shortness of breath. 12/20/17   Kinnie Feil, MD  allopurinol (ZYLOPRIM) 300 MG tablet Take 1 tablet (300 mg total) by mouth daily. 11/29/17   [name], MD  aspirin 81 MG tablet Take 81 mg by mouth daily.    [provider]  ciprofloxacin (CIPRO) 500 MG tablet Take 1 tablet (500 mg total) by mouth 2 (two) times daily. One po bid x 7 days  12/24/17   Drenda Freeze, MD  citalopram (CELEXA) 40 MG tablet Take 1 tablet (40 mg total) by mouth daily. 05/17/17   Nafziger, Tommi Rumps, NP  fluticasone furoate-vilanterol (BREO ELLIPTA) 100-25 MCG/INH AEPB Inhale 1 puff into the lungs every morning. 07/05/17   Tanda Rockers, MD  HYDROcodone-acetaminophen (NORCO/VICODIN) 5-325 MG tablet Take 1 tablet by mouth every 4 (four) hours as needed for moderate pain. Patient taking differently: Take 0.5-1 tablets by mouth every 4 (four) hours as needed for moderate pain.  11/21/17    [name], MD  ibuprofen (ADVIL,MOTRIN) 200 MG tablet Take 200 mg by mouth every 6 (six) hours as needed for moderate pain.    [provider]  lidocaine-prilocaine (EMLA) cream Apply to affected area once 11/29/17   [name], MD  magic mouthwash w/lidocaine SOLN Take 10 mLs by mouth 4 (four) times daily. Swish and swallow 10 ml QID Patient taking differently: Take 10 mLs by mouth 4 (four) times daily as needed for mouth pain. Swish and swallow 10 ml QID 12/15/17   [name], MD  ondansetron (ZOFRAN) 8 MG tablet Take 1 tablet (8 mg total) by mouth every 8 (eight) hours as needed for nausea. 11/21/17   [name], MD  oseltamivir (TAMIFLU) 30 MG capsule Take 1 capsule (30 mg total) by mouth 2 (two) times daily. 12/20/17   Kinnie Feil, MD  pantoprazole (PROTONIX) 40 MG tablet Take 1 tablet (40 mg total) by mouth daily. 10/12/17   Nafziger, Tommi Rumps, NP  polyethylene glycol (GOLYTELY) 236 g solution Take 4,000 mLs by mouth once for 1 dose. 12/25/17 12/25/17  Drenda Freeze, MD  predniSONE (DELTASONE) 20 MG tablet Take 2 tablets (40 mg total) by mouth daily. Start 40 mg daily on 2/23. After chemo, take 40 mg for 4 days 11/29/17   [name], MD  prochlorperazine (COMPAZINE) 10 MG tablet Take 1 tablet (10 mg total) by mouth every 6 (six) hours as needed (Nausea or vomiting). 11/29/17   [name], MD  senna (SENOKOT) 8.6 MG TABS tablet Take 2 tablets (17.2 mg total) by mouth daily as needed for mild constipation. 11/25/17   Marene Lenz, MD    Family History Family History  Problem Relation Age of Onset  . Cancer Mother        colon ca    Social History Social History   Tobacco Use  . Smoking status: Former Smoker    Packs/day: 0.25    Years: 51.00    Pack years: 12.75    Types: Cigarettes    Last attempt to quit: 03/12/2015    Years since quitting: 2.7  . Smokeless tobacco: Never Used  Substance Use Topics  . Alcohol use: Yes    Alcohol/week: 0.0 oz    Comment: 1  glass of wine monthly  . Drug use: No     Allergies   Contrast media [iodinated diagnostic agents]; Iohexol; Povidone-iodine; Penicillins; and Adhesive [tape]   Review of Systems Review of Systems  Gastrointestinal: Positive for abdominal pain.  All other systems reviewed and are negative.    Physical Exam Updated Vital Signs BP 124/71 (BP Location: Left Arm)   Pulse 90   Temp 97.9 F (36.6 C) (Oral)   Resp 16   Ht 5\' 4"  (1.626 m)   Wt 56.2 kg (124 lb)   SpO2 91%   BMI 21.28 kg/m   Physical Exam  Constitutional: She is oriented to person, place, and time. She appears well-developed.  Chronically ill, uncomfortable   HENT:  Head: Normocephalic.  MM slightly dry   Eyes: Conjunctivae and EOM are normal. Pupils are equal, round, and reactive to light.  Neck: Normal range of motion. Neck supple.  Cardiovascular: Normal rate, regular rhythm and normal heart sounds.  Pulmonary/Chest: Effort normal and breath sounds normal. No stridor. No respiratory distress. She has no wheezes.  Abdominal: Soft.  Mild RLQ and LLQ tenderness, no rebound   Genitourinary:  Genitourinary Comments: Rectal- no obvious stool impaction   Musculoskeletal: Normal range of motion.  Neurological: She is alert and oriented to person, place, and time.  Skin: Skin is warm.  Psychiatric: She has a normal mood and affect.  Nursing note and vitals reviewed.    ED Treatments / Results  Labs (all labs ordered are listed, but only abnormal results are displayed) Labs Reviewed  COMPREHENSIVE METABOLIC PANEL - Abnormal; Notable for the following components:      Result Value   Potassium 3.0 (*)    Chloride 99 (*)    Glucose, Bld 105 (*)    Total Protein 6.4 (*)    Alkaline Phosphatase 196 (*)    All other components within normal limits  CBC - Abnormal; Notable for the following components:   WBC 13.3 (*)    RBC 3.16 (*)    Hemoglobin 10.1 (*)    HCT 32.2 (*)    MCV 101.9 (*)    RDW 15.8 (*)      Platelets 521 (*)    All other components within normal limits  LIPASE, BLOOD  URINALYSIS, ROUTINE W REFLEX MICROSCOPIC    EKG  EKG Interpretation None       Radiology Ct Abdomen Pelvis Wo Contrast  Result Date: 12/24/2017 CLINICAL DATA:  Abdominal distension. Patient reports no bowel movement for 7 days. EXAM: CT ABDOMEN AND PELVIS WITHOUT CONTRAST TECHNIQUE: Multidetector CT imaging of the abdomen and pelvis was performed following the standard protocol without IV contrast. COMPARISON:  Radiograph 12/18/2017.  PET-CT 11/22/2017 FINDINGS: Lower chest: Mild bibasilar scarring. Chronic bilateral lower lobe bronchiectasis. No pleural fluid. No consolidation. Moderate hiatal hernia. Hepatobiliary: Ill-defined low density in the medial right hepatic lobe at site of hypermetabolic mass on prior PET-CT, not well characterized without contrast, but possibly decreased in size. Gallbladder physiologically distended, no calcified stone. No biliary dilatation. Pancreas: Parenchymal atrophy. No ductal dilatation or inflammation. Spleen: Normal in size.  No focal abnormality on noncontrast exam. Adrenals/Urinary Tract: Unchanged right adrenal thickening. Left adrenal gland is normal. Thinning of bilateral renal parenchyma without hydronephrosis. The hypermetabolic left kidney lesion on prior PET is not well characterized on the current exam without contrast. No perinephric stranding. Urinary bladder is partially distended. No bladder wall thickening. Stomach/Bowel: Large diffuse colonic stool burden with relative transition to decompressed colon involving the mid sigmoid, best appreciated on image 45. There is mild pericolonic edema in this region without well-defined colonic mass. Colonic diverticulosis without diverticulitis. Small amount of pericolonic stranding involves the splenic flexure and descending colon with small amount of fluid in the pericolic gutter. No small bowel dilatation, inflammation or  obstruction. Stomach is nondistended, small to moderate hiatal hernia. Vascular/Lymphatic: Decreased porta hepatis adenopathy, not well-defined in the absence of contrast. Right cardiophrenic angle lymph nodes have decreased in size from prior PET. Retroperitoneal adenopathy is diminished with mild persistent periaortic and aortocaval soft tissue density. No evidence of new or progressive intra-abdominal/pelvic adenopathy. Moderate aortic atherosclerosis. Reproductive: Small uterine calcifications may be fibroids. No adnexal mass.  Other: Minimal free fluid in the pericolic gutter. No free air or intra-abdominal abscess. Subcutaneous soft tissue density overlies the left gluteal muscles. Musculoskeletal: Scoliosis and mild degenerative change in the spine. Unchanged sclerotic densities in the right proximal femur and right inferior pubic ramus are not hypermetabolic on prior PET. IMPRESSION: 1. Large colonic stool burden with relative transition to decompressed colon in the mid sigmoid, with mild associated pericolonic stranding. This may reflect focal stricture or colitis. Additional stranding about the splenic flexure and descending colon without definite colonic wall thickening at the sites is nonspecific, but can be seen with stercoral disease. No perforation or free air. No small bowel dilatation. 2. Improved intra-abdominal lymphadenopathy from PET CT last month. Right hepatic lobe lesion is not well-defined without contrast but has likely decreased in size. The hypermetabolic left renal lesion on prior PET is not well-defined in the absence of contrast. No evidence of progressive intra-abdominal/pelvic adenopathy. 3. Patchy subcutaneous density overlies the left gluteal muscles with mild surrounding edema. This may reflect hematoma if there is history of injury, recommend correlation with clinical history. Given history of lymphoma, lymphomatous involvement is not excluded. Recommend attention at follow-up PET  imaging. 4.  Aortic Atherosclerosis (ICD10-I70.0). Electronically Signed   By: Jeb Levering M.D.   On: 12/24/2017 23:17   Dg Chest 2 View  Result Date: 12/24/2017 CLINICAL DATA:  Hypoxia. EXAM: CHEST - 2 VIEW COMPARISON:  12/17/2017 FINDINGS: Tip of the right chest port in the mid SVC. Minimal aortic atherosclerosis. The cardiomediastinal contours are normal. The lungs are clear. Pulmonary vasculature is normal. No consolidation, pleural effusion, or pneumothorax. No acute osseous abnormalities are seen. Intramedullary rod and screw fixation of pathologic mid humerus fracture. IMPRESSION: No acute abnormality or explanation for hypoxia. Electronically Signed   By: Jeb Levering M.D.   On: 12/24/2017 22:41    Procedures Procedures (including critical care time)  Medications Ordered in ED Medications  ciprofloxacin (CIPRO) tablet 500 mg (not administered)  sodium chloride 0.9 % bolus 1,000 mL (1,000 mLs Intravenous New Bag/Given 12/24/17 2151)  potassium chloride SA (K-DUR,KLOR-CON) CR tablet 40 mEq (40 mEq Oral Given 12/24/17 2150)  sodium phosphate (FLEET) 7-19 GM/118ML enema 1 enema (1 enema Rectal Given 12/24/17 2352)     Initial Impression / Assessment and Plan / ED Course  I have reviewed the triage vital signs and the nursing notes.  Pertinent labs & imaging results that were available during my care of the patient were reviewed by me and considered in my medical decision making (see chart for details).     Brooke Washington is a 77 y.o. female here with abdominal pain, constipation. No previous abdominal surgeries or SBO. No obvious stool impaction on rectal exam. Consider partial SBO vs ileus. Will get labs, UA, CT ab/pel. O2 85-88% on RA in triage but denies any cough or shortness of breath. Will get CXR to r/o pneumonia.   11:59 PM CXR clear. K 3.0, supplemented. CT showed large stool burden with possible stercoral colitis. Patient states that her O2 is around 85-88% on RA  since the [facility] 2 weeks ago. Offered admission for colitis and constipation but she wants to go home. Able to have a large bowel movement after enema. Will dc home with cipro, golytely.     Final Clinical Impressions(s) / ED Diagnoses   Final diagnoses:  Constipation, unspecified constipation type  Colitis  Hypoxia    ED Discharge Orders        Ordered  polyethylene glycol (GOLYTELY) 236 g solution   Once     12/24/17 2357    ciprofloxacin (CIPRO) 500 MG tablet  2 times daily     12/24/17 2357       Drenda Freeze, MD 12/24/17 2354    Drenda Freeze, MD 12/24/17 2359    Drenda Freeze, MD 12/25/17 [phone]

## 2017-12-24 NOTE — ED Notes (Signed)
Patient request lab draw from port. 

## 2017-12-24 NOTE — ED Notes (Signed)
Patient now requesting venipuncture draw.

## 2017-12-25 DIAGNOSIS — K5900 Constipation, unspecified: Secondary | ICD-10-CM | POA: Diagnosis not present

## 2017-12-25 NOTE — ED Notes (Signed)
Pt had a bowel movement, post enema

## 2017-12-26 ENCOUNTER — Telehealth: Payer: Self-pay

## 2017-12-26 ENCOUNTER — Encounter: Payer: Self-pay | Admitting: Internal Medicine

## 2017-12-26 ENCOUNTER — Telehealth: Payer: Self-pay | Admitting: Adult Health

## 2017-12-26 NOTE — Telephone Encounter (Signed)
Called back and given below message. Verbalized understanding.

## 2017-12-26 NOTE — Telephone Encounter (Signed)
I need her to call Dr. Melvyn Novas office for assessment. She was last seen in November. I cannot address her low oxygen issue

## 2017-12-26 NOTE — Telephone Encounter (Signed)
Called to see she is doing after going to ER

## 2017-12-26 NOTE — Telephone Encounter (Signed)
She got a enema in the ER and feels much better. No problems with constipation at this time, having regular bm's. Instructed that she can take Miralax 2x a day, Senokot 2 tabs tid and Ducolax daily if needed. Instructed to call office if needed. Husband is worried about his wife's oxygen saturation, he checks it at home. He states that it runs in high 80's some times. This morning it was 93 %, then settled at 86 %. Will talk with Dr. Alvy Bimler regarding oxygen level on 3/20 appt.

## 2017-12-27 NOTE — Telephone Encounter (Signed)
Spoke with the pt's spouse and apologized that he did not get a call back yesterday  He states that he is concerned that pt will be a few min late for ov with TP for tomorrow  She has ov with Dr Alvy Bimler at 11:15 and ov here at 11:45  She really needs to be seen to see about getting prescribed o2  He wants to know if it's ok if they arrive a few min late  TP- what do you want me to tell him?  I would offer another appt time but there is not one with you or any other provider

## 2017-12-27 NOTE — Telephone Encounter (Signed)
See if she can come at 10 and keep her in my 11:45 slot

## 2017-12-27 NOTE — Telephone Encounter (Signed)
So sorry - was in clinic all day yesterday and came in late today Can triage please call patient to see what is needed please?  Thank you!

## 2017-12-27 NOTE — Telephone Encounter (Signed)
Called and spoke with pt's husband Herbie Baltimore to see if they could come tomorrow at 10am to allow more time before pt's appt with oncologist.  Herbie Baltimore stated they will be here at 10am to see TP.  Stated to Herbie Baltimore they could arrive around 9:45 for check in that way after that if there is a room available, they could be brought back if not then at the office visit at 10:00.  Will keep appt in the 11:45 timeslot. Have updated appt notes that pt will be arriving at 10am per TP.  Nothing further needed at this current time.

## 2017-12-28 ENCOUNTER — Telehealth: Payer: Self-pay | Admitting: Internal Medicine

## 2017-12-28 ENCOUNTER — Encounter: Payer: Self-pay | Admitting: Adult Health

## 2017-12-28 ENCOUNTER — Telehealth: Payer: Self-pay | Admitting: Hematology and Oncology

## 2017-12-28 ENCOUNTER — Inpatient Hospital Stay: Payer: Medicare Other

## 2017-12-28 ENCOUNTER — Inpatient Hospital Stay (HOSPITAL_BASED_OUTPATIENT_CLINIC_OR_DEPARTMENT_OTHER): Payer: Medicare Other | Admitting: Hematology and Oncology

## 2017-12-28 ENCOUNTER — Ambulatory Visit (INDEPENDENT_AMBULATORY_CARE_PROVIDER_SITE_OTHER): Payer: Medicare Other | Admitting: Adult Health

## 2017-12-28 VITALS — BP 130/64 | HR 113 | Ht 64.0 in | Wt 123.0 lb

## 2017-12-28 DIAGNOSIS — J9611 Chronic respiratory failure with hypoxia: Secondary | ICD-10-CM | POA: Diagnosis not present

## 2017-12-28 DIAGNOSIS — K5909 Other constipation: Secondary | ICD-10-CM

## 2017-12-28 DIAGNOSIS — J449 Chronic obstructive pulmonary disease, unspecified: Secondary | ICD-10-CM

## 2017-12-28 DIAGNOSIS — C8338 Diffuse large B-cell lymphoma, lymph nodes of multiple sites: Secondary | ICD-10-CM | POA: Diagnosis not present

## 2017-12-28 DIAGNOSIS — Z5112 Encounter for antineoplastic immunotherapy: Secondary | ICD-10-CM | POA: Diagnosis not present

## 2017-12-28 DIAGNOSIS — G893 Neoplasm related pain (acute) (chronic): Secondary | ICD-10-CM | POA: Diagnosis not present

## 2017-12-28 DIAGNOSIS — R110 Nausea: Secondary | ICD-10-CM | POA: Diagnosis not present

## 2017-12-28 DIAGNOSIS — C8588 Other specified types of non-Hodgkin lymphoma, lymph nodes of multiple sites: Secondary | ICD-10-CM | POA: Diagnosis not present

## 2017-12-28 DIAGNOSIS — K1231 Oral mucositis (ulcerative) due to antineoplastic therapy: Secondary | ICD-10-CM

## 2017-12-28 DIAGNOSIS — Z5111 Encounter for antineoplastic chemotherapy: Secondary | ICD-10-CM | POA: Diagnosis not present

## 2017-12-28 DIAGNOSIS — Z8572 Personal history of non-Hodgkin lymphomas: Secondary | ICD-10-CM

## 2017-12-28 DIAGNOSIS — Z5189 Encounter for other specified aftercare: Secondary | ICD-10-CM | POA: Diagnosis not present

## 2017-12-28 DIAGNOSIS — R51 Headache: Secondary | ICD-10-CM | POA: Diagnosis not present

## 2017-12-28 DIAGNOSIS — K5900 Constipation, unspecified: Secondary | ICD-10-CM | POA: Diagnosis not present

## 2017-12-28 DIAGNOSIS — R05 Cough: Secondary | ICD-10-CM | POA: Diagnosis not present

## 2017-12-28 LAB — CBC WITH DIFFERENTIAL (CANCER CENTER ONLY)
Basophils Absolute: 0.1 10*3/uL (ref 0.0–0.1)
Basophils Relative: 0 %
Eosinophils Absolute: 0 10*3/uL (ref 0.0–0.5)
Eosinophils Relative: 0 %
HEMATOCRIT: 33.3 % — AB (ref 34.8–46.6)
HEMOGLOBIN: 10.5 g/dL — AB (ref 11.6–15.9)
LYMPHS PCT: 9 %
Lymphs Abs: 1.4 10*3/uL (ref 0.9–3.3)
MCH: 32 pg (ref 25.1–34.0)
MCHC: 31.5 g/dL (ref 31.5–36.0)
MCV: 101.5 fL — AB (ref 79.5–101.0)
MONO ABS: 0.8 10*3/uL (ref 0.1–0.9)
Monocytes Relative: 6 %
NEUTROS ABS: 12.5 10*3/uL — AB (ref 1.5–6.5)
NEUTROS PCT: 85 %
Platelet Count: 512 10*3/uL — ABNORMAL HIGH (ref 145–400)
RBC: 3.28 MIL/uL — ABNORMAL LOW (ref 3.70–5.45)
RDW: 16.7 % — AB (ref 11.2–14.5)
WBC Count: 14.8 10*3/uL — ABNORMAL HIGH (ref 3.9–10.3)

## 2017-12-28 LAB — SAMPLE TO BLOOD BANK

## 2017-12-28 LAB — CMP (CANCER CENTER ONLY)
ALBUMIN: 3.2 g/dL — AB (ref 3.5–5.0)
ALK PHOS: 156 U/L — AB (ref 40–150)
ALT: 22 U/L (ref 0–55)
AST: 27 U/L (ref 5–34)
Anion gap: 10 (ref 3–11)
BILIRUBIN TOTAL: 0.4 mg/dL (ref 0.2–1.2)
BUN: 8 mg/dL (ref 7–26)
CALCIUM: 9.9 mg/dL (ref 8.4–10.4)
CO2: 27 mmol/L (ref 22–29)
CREATININE: 0.78 mg/dL (ref 0.60–1.10)
Chloride: 103 mmol/L (ref 98–109)
GFR, Est AFR Am: >60 mL/min (ref >60)
Glucose, Bld: 91 mg/dL (ref 70–140)
Potassium: 3.7 mmol/L (ref 3.5–5.1)
Sodium: 140 mmol/L (ref 136–145)
Total Protein: 6.6 g/dL (ref 6.4–8.3)

## 2017-12-28 LAB — LACTATE DEHYDROGENASE: LDH: 407 U/L — ABNORMAL HIGH (ref 125–245)

## 2017-12-28 LAB — URIC ACID: URIC ACID, SERUM: 3.1 mg/dL (ref 2.6–7.4)

## 2017-12-28 NOTE — Telephone Encounter (Signed)
Spoke with the pt's spouse  He states we ordered o2 for the pt today, but she had to leave for another appt before we could get her started  He states that he had a message to call DME to "go over forms" but he did not know what company it was  He is confused about what he needs to do  The order was sent to [place] and so I gave him their phone number  He states wants to discuss with [facility] b/c he thinks this is who called him, but he is unsure b/c no name was left

## 2017-12-28 NOTE — Patient Instructions (Addendum)
Continue on BREO daily.  Begin Oxygen 2l/m with activity .  Check ONO .  POC order  Follow up with Dr. Melvyn Novas or [place] NP   In 6-8 weeks and As needed

## 2017-12-28 NOTE — Telephone Encounter (Signed)
Gave avs and calendar ° °

## 2017-12-28 NOTE — Progress Notes (Signed)
_0  ID: Brooke Washington, female    DOB: Mar 15, 1941, 77 y.o.   MRN: [id number]  Chief Complaint  Patient presents with  . 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   TEST   Spirometry 07/05/2017  FEV1 1.15 (55%)  Ratio 56   - PFT's  08/16/2017  FEV1 1.31 (66 % ) ratio 56  p 17 % improvement from saba p nothing prior to study with DLCO  36/35 % corrects to 47 % for alv volume    12/28/2017 Follow up : COPD  Patient presents for a follow-up.  Patient has moderate COPD.  She is on BREO daily.  Says she gets winded easily.  Recent admission for influenza and pancytopenia felt secondary to chemo.  She is starting to feel some better but remains weak with low energy .  O2 levels have been dropping over the last month in the mid 80s to upper 80s.  With activity. Walk test in the office showed O2 saturation at rest 92%, walking 88% on room air , on oxygen at 2l/m 92%.   She has large cell lymphoma follows with oncology      Allergies  Allergen Reactions  . Contrast Media [Iodinated Diagnostic Agents] Shortness Of Breath, Rash and Other (See Comments)    Throat swells  . Iohexol Shortness Of Breath  . Povidone-Iodine Hives, Shortness Of Breath and Rash    REACTION: throat swells  . Penicillins Hives, Itching and Rash    Has patient had a PCN reaction causing immediate rash, facial/tongue/throat swelling, SOB or lightheadedness with hypotension: Unknown Has patient had a PCN reaction causing severe rash involving mucus membranes or skin necrosis: Yes Has patient had a PCN reaction that required hospitalization: Unknown Has patient had a PCN reaction occurring within the last 10 years: Unknown If all of the above answers are "NO", then may proceed with Cephalosporin use.   . Adhesive [Tape] Hives    Immunization History    Administered Date(s) Administered  . Influenza Split 09/09/2011, 07/07/2012  . Influenza Whole 08/24/2007, 07/16/2008, 07/14/2010  . Influenza, High Dose Seasonal PF 07/28/2017  . Influenza,inj,Quad PF,6+ Mos 07/19/2013, 06/11/2014, 07/01/2015  . Pneumococcal Conjugate-13 02/07/2015  . Pneumococcal Polysaccharide-23 05/22/2010  . Td 10/11/1996    Past Medical History:  Diagnosis Date  . Cancer ([place])  APRIL OF 2000   LOW-GRADE NON-HODGKIN'S LYMPHOMA  . COPD (chronic obstructive pulmonary disease) ([place])   . Emphysema of lung ([place])   . Hyperlipidemia     Tobacco History: Social History   Tobacco Use  Smoking Status Former Smoker  . Packs/day: 0.25  . Years: 51.00  . Pack years: 12.75  . Types: Cigarettes  . Last attempt to quit: 03/12/2015  . Years since quitting: 2.8  Smokeless Tobacco Never Used   Counseling given: Not Answered   Outpatient Encounter Medications as of 12/28/2017  Medication Sig  . acetaminophen (TYLENOL) 325 MG tablet Take 2 tablets (650 mg total) by mouth every 6 (six) hours as needed for mild pain (or Fever >/= 101).  Marland Kitchen albuterol (PROVENTIL HFA;VENTOLIN HFA) 108 (90 Base) MCG/ACT inhaler Inhale 2 puffs into the lungs every 6 (six) hours as needed for wheezing or shortness of breath.  . allopurinol (ZYLOPRIM) 300 MG tablet Take 1 tablet (300 mg total) by mouth daily.  Marland Kitchen  aspirin 81 MG tablet Take 81 mg by mouth daily.  . ciprofloxacin (CIPRO) 500 MG tablet Take 1 tablet (500 mg total) by mouth 2 (two) times daily. One po bid x 7 days  . citalopram (CELEXA) 40 MG tablet Take 1 tablet (40 mg total) by mouth daily.  . fluticasone furoate-vilanterol (BREO ELLIPTA) 100-25 MCG/INH AEPB Inhale 1 puff into the lungs every morning.  Marland Kitchen HYDROcodone-acetaminophen (NORCO/VICODIN) 5-325 MG tablet Take 1 tablet by mouth every 4 (four) hours as needed for moderate pain. (Patient taking differently: Take 0.5-1 tablets by mouth every 4 (four) hours as needed for moderate  pain. )  . ibuprofen (ADVIL,MOTRIN) 200 MG tablet Take 200 mg by mouth every 6 (six) hours as needed for moderate pain.  Marland Kitchen lidocaine-prilocaine (EMLA) cream Apply to affected area once  . magic mouthwash w/lidocaine SOLN Take 10 mLs by mouth 4 (four) times daily. Swish and swallow 10 ml QID (Patient taking differently: Take 10 mLs by mouth 4 (four) times daily as needed for mouth pain. Swish and swallow 10 ml QID)  . ondansetron (ZOFRAN) 8 MG tablet Take 1 tablet (8 mg total) by mouth every 8 (eight) hours as needed for nausea.  Marland Kitchen oseltamivir (TAMIFLU) 30 MG capsule Take 1 capsule (30 mg total) by mouth 2 (two) times daily. (Patient not taking: Reported on 12/25/2017)  . pantoprazole (PROTONIX) 40 MG tablet Take 1 tablet (40 mg total) by mouth daily.  . predniSONE (DELTASONE) 20 MG tablet Take 2 tablets (40 mg total) by mouth daily. Start 40 mg daily on 2/23. After chemo, take 40 mg for 4 days  . prochlorperazine (COMPAZINE) 10 MG tablet Take 1 tablet (10 mg total) by mouth every 6 (six) hours as needed (Nausea or vomiting).  Marland Kitchen senna (SENOKOT) 8.6 MG TABS tablet Take 2 tablets (17.2 mg total) by mouth daily as needed for mild constipation.   No facility-administered encounter medications on file as of 12/28/2017.      Review of Systems  Constitutional:   No  weight loss, night sweats,  Fevers, chills, +fatigue, or  lassitude.  HEENT:   No headaches,  Difficulty swallowing,  Tooth/dental problems, or  Sore throat,                No sneezing, itching, ear ache, nasal congestion, post nasal drip,   CV:  No chest pain,  Orthopnea, PND, swelling in lower extremities, anasarca, dizziness, palpitations, syncope.   GI  No heartburn, indigestion, abdominal pain, nausea, vomiting, diarrhea, change in bowel habits, loss of appetite, bloody stools.   Resp:    No chest wall deformity  Skin: no rash or lesions.  GU: no dysuria, change in color of urine, no urgency or frequency.  No flank pain, no  hematuria   MS:  No joint pain or swelling.  No decreased range of motion.  No back pain.    Physical Exam    GEN: A/Ox3; pleasant , NAD, frail and elderly    HEENT:  [place]/AT,  EACs-clear, TMs-wnl, NOSE-clear, THROAT-clear, no lesions, no postnasal drip or exudate noted.   NECK:  Supple w/ fair ROM; no JVD; normal carotid impulses w/o bruits; no thyromegaly or nodules palpated; no lymphadenopathy.    RESP  Decreased BS in bases , . no accessory muscle use, no dullness to percussion  CARD:  RRR, no m/r/g, no peripheral edema, pulses intact, no cyanosis or clubbing.  GI:   Soft & nt; nml bowel sounds; no organomegaly or masses detected.  Musco: Warm bil, no deformities or joint swelling noted.   Neuro: alert, no focal deficits noted.    Skin: Warm, no lesions or rashes    Lab Results:    BNP No results found for: BNP  ProBNP No results found for: PROBNP  Imaging: Ct Abdomen Pelvis Wo Contrast  Result Date: 12/24/2017 CLINICAL DATA:  Abdominal distension. Patient reports no bowel movement for 7 days. EXAM: CT ABDOMEN AND PELVIS WITHOUT CONTRAST TECHNIQUE: Multidetector CT imaging of the abdomen and pelvis was performed following the standard protocol without IV contrast. COMPARISON:  Radiograph 12/18/2017.  PET-CT 11/22/2017 FINDINGS: Lower chest: Mild bibasilar scarring. Chronic bilateral lower lobe bronchiectasis. No pleural fluid. No consolidation. Moderate hiatal hernia. Hepatobiliary: Ill-defined low density in the medial right hepatic lobe at site of hypermetabolic mass on prior PET-CT, not well characterized without contrast, but possibly decreased in size. Gallbladder physiologically distended, no calcified stone. No biliary dilatation. Pancreas: Parenchymal atrophy. No ductal dilatation or inflammation. Spleen: Normal in size.  No focal abnormality on noncontrast exam. Adrenals/Urinary Tract: Unchanged right adrenal thickening. Left adrenal gland is normal. Thinning of  bilateral renal parenchyma without hydronephrosis. The hypermetabolic left kidney lesion on prior PET is not well characterized on the current exam without contrast. No perinephric stranding. Urinary bladder is partially distended. No bladder wall thickening. Stomach/Bowel: Large diffuse colonic stool burden with relative transition to decompressed colon involving the mid sigmoid, best appreciated on image 45. There is mild pericolonic edema in this region without well-defined colonic mass. Colonic diverticulosis without diverticulitis. Small amount of pericolonic stranding involves the splenic flexure and descending colon with small amount of fluid in the pericolic gutter. No small bowel dilatation, inflammation or obstruction. Stomach is nondistended, small to moderate hiatal hernia. Vascular/Lymphatic: Decreased porta hepatis adenopathy, not well-defined in the absence of contrast. Right cardiophrenic angle lymph nodes have decreased in size from prior PET. Retroperitoneal adenopathy is diminished with mild persistent periaortic and aortocaval soft tissue density. No evidence of new or progressive intra-abdominal/pelvic adenopathy. Moderate aortic atherosclerosis. Reproductive: Small uterine calcifications may be fibroids. No adnexal mass. Other: Minimal free fluid in the pericolic gutter. No free air or intra-abdominal abscess. Subcutaneous soft tissue density overlies the left gluteal muscles. Musculoskeletal: Scoliosis and mild degenerative change in the spine. Unchanged sclerotic densities in the right proximal femur and right inferior pubic ramus are not hypermetabolic on prior PET. IMPRESSION: 1. Large colonic stool burden with relative transition to decompressed colon in the mid sigmoid, with mild associated pericolonic stranding. This may reflect focal stricture or colitis. Additional stranding about the splenic flexure and descending colon without definite colonic wall thickening at the sites is  nonspecific, but can be seen with stercoral disease. No perforation or free air. No small bowel dilatation. 2. Improved intra-abdominal lymphadenopathy from PET CT last month. Right hepatic lobe lesion is not well-defined without contrast but has likely decreased in size. The hypermetabolic left renal lesion on prior PET is not well-defined in the absence of contrast. No evidence of progressive intra-abdominal/pelvic adenopathy. 3. Patchy subcutaneous density overlies the left gluteal muscles with mild surrounding edema. This may reflect hematoma if there is history of injury, recommend correlation with clinical history. Given history of lymphoma, lymphomatous involvement is not excluded. Recommend attention at follow-up PET imaging. 4.  Aortic Atherosclerosis (ICD10-I70.0). Electronically Signed   By: Jeb Levering M.D.   On: 12/24/2017 23:17   Dg Chest 2 View  Result Date: 12/24/2017 CLINICAL DATA:  Hypoxia. EXAM: CHEST - 2 VIEW  COMPARISON:  12/17/2017 FINDINGS: Tip of the right chest port in the mid SVC. Minimal aortic atherosclerosis. The cardiomediastinal contours are normal. The lungs are clear. Pulmonary vasculature is normal. No consolidation, pleural effusion, or pneumothorax. No acute osseous abnormalities are seen. Intramedullary rod and screw fixation of pathologic mid humerus fracture. IMPRESSION: No acute abnormality or explanation for hypoxia. Electronically Signed   By: Jeb Levering M.D.   On: 12/24/2017 22:41   Dg Chest 2 View  Result Date: 12/17/2017 CLINICAL DATA:  Acute onset of generalized weakness and lethargy. Mild hypoxia. Productive cough and malaise. EXAM: CHEST - 2 VIEW COMPARISON:  Chest radiograph performed 05/17/2017 FINDINGS: The lungs are well-aerated and clear. There is no evidence of focal opacification, pleural effusion or pneumothorax. The heart is normal in size; the mediastinal contour is within normal limits. No acute osseous abnormalities are seen. Right humeral  hardware is incompletely imaged but appears grossly unremarkable, transfixing the right humeral diaphyseal fracture. A right-sided chest port is noted ending about the mid to distal SVC. IMPRESSION: No acute cardiopulmonary process seen. Electronically Signed   By: Garald Balding M.D.   On: 12/17/2017 22:30   Dg Abd 1 View  Result Date: 12/18/2017 CLINICAL DATA:  Acute onset of generalized abdominal fullness. EXAM: ABDOMEN - 1 VIEW COMPARISON:  PET/CT performed 11/22/2017 FINDINGS: The visualized bowel gas pattern is unremarkable. Scattered air and stool filled loops of colon are seen; no abnormal dilatation of small bowel loops is seen to suggest small bowel obstruction. No free intra-abdominal air is identified, though evaluation for free air is limited on a single supine view. The visualized osseous structures are within normal limits; the sacroiliac joints are unremarkable in appearance. The visualized lung bases are essentially clear. IMPRESSION: Unremarkable bowel gas pattern; no free intra-abdominal air seen. Moderate amount of stool noted in the colon. Electronically Signed   By: Garald Balding M.D.   On: 12/18/2017 01:46   Ir US Guide Vasc Access Right  Result Date: 12/07/2017 INDICATION: History of lymphoma. In need of durable intravenous access for chemotherapy administration. EXAM: IMPLANTED PORT A CATH PLACEMENT WITH ULTRASOUND AND FLUOROSCOPIC GUIDANCE COMPARISON:  PET-CT - 11/22/2016 MEDICATIONS: Clindamycin 600 mg IV; The antibiotic was administered within an appropriate time interval prior to skin puncture. ANESTHESIA/SEDATION: Moderate (conscious) sedation was employed during this procedure. A total of Versed 1 mg and Fentanyl 75 mcg was administered intravenously. Moderate Sedation Time: 30 minutes. The patient's level of consciousness and vital signs were monitored continuously by radiology nursing throughout the procedure under my direct supervision. CONTRAST:  None FLUOROSCOPY TIME:  54  seconds (5 mGy) COMPLICATIONS: None immediate. PROCEDURE: The procedure, risks, benefits, and alternatives were explained to the patient. Questions regarding the procedure were encouraged and answered. The patient understands and consents to the procedure. The right neck and chest were prepped with chlorhexidine in a sterile fashion, and a sterile drape was applied covering the operative field. Maximum barrier sterile technique with sterile gowns and gloves were used for the procedure. A timeout was performed prior to the initiation of the procedure. Local anesthesia was provided with 1% lidocaine with epinephrine. After creating a small venotomy incision, a micropuncture kit was utilized to access the internal jugular vein. Real-time ultrasound guidance was utilized for vascular access including the acquisition of a permanent ultrasound image documenting patency of the accessed vessel. The microwire was utilized to measure appropriate catheter length. A subcutaneous port pocket was then created along the upper chest wall utilizing a combination  of sharp and blunt dissection. The pocket was irrigated with sterile saline. A single lumen thin power injectable port was chosen for placement. The 8 Fr catheter was tunneled from the port pocket site to the venotomy incision. The port was placed in the pocket. The external catheter was trimmed to appropriate length. At the venotomy, an 8 Fr peel-away sheath was placed over a guidewire under fluoroscopic guidance. The catheter was then placed through the sheath and the sheath was removed. Final catheter positioning was confirmed and documented with a fluoroscopic spot radiograph. The port was accessed with a Huber needle, aspirated and flushed with heparinized saline. The venotomy site was closed with an interrupted 4-0 Vicryl suture. The port pocket incision was closed with interrupted 2-0 Vicryl suture and the skin was opposed with a running subcuticular 4-0 Vicryl  suture. Dermabond and Steri-strips were applied to both incisions. Dressings were placed. The patient tolerated the procedure well without immediate post procedural complication. FINDINGS: After catheter placement, the tip lies within the superior cavoatrial junction. The catheter aspirates and flushes normally and is ready for immediate use. IMPRESSION: Successful placement of a right internal jugular approach power injectable Port-A-Cath. The catheter is ready for immediate use. Electronically Signed   By: Sandi Mariscal M.D.   On: 12/07/2017 15:33   Ir Fluoro Guide Port Insertion Right  Result Date: 12/07/2017 INDICATION: History of lymphoma. In need of durable intravenous access for chemotherapy administration. EXAM: IMPLANTED PORT A CATH PLACEMENT WITH ULTRASOUND AND FLUOROSCOPIC GUIDANCE COMPARISON:  PET-CT - 11/22/2016 MEDICATIONS: Clindamycin 600 mg IV; The antibiotic was administered within an appropriate time interval prior to skin puncture. ANESTHESIA/SEDATION: Moderate (conscious) sedation was employed during this procedure. A total of Versed 1 mg and Fentanyl 75 mcg was administered intravenously. Moderate Sedation Time: 30 minutes. The patient's level of consciousness and vital signs were monitored continuously by radiology nursing throughout the procedure under my direct supervision. CONTRAST:  None FLUOROSCOPY TIME:  54 seconds (5 mGy) COMPLICATIONS: None immediate. PROCEDURE: The procedure, risks, benefits, and alternatives were explained to the patient. Questions regarding the procedure were encouraged and answered. The patient understands and consents to the procedure. The right neck and chest were prepped with chlorhexidine in a sterile fashion, and a sterile drape was applied covering the operative field. Maximum barrier sterile technique with sterile gowns and gloves were used for the procedure. A timeout was performed prior to the initiation of the procedure. Local anesthesia was provided  with 1% lidocaine with epinephrine. After creating a small venotomy incision, a micropuncture kit was utilized to access the internal jugular vein. Real-time ultrasound guidance was utilized for vascular access including the acquisition of a permanent ultrasound image documenting patency of the accessed vessel. The microwire was utilized to measure appropriate catheter length. A subcutaneous port pocket was then created along the upper chest wall utilizing a combination of sharp and blunt dissection. The pocket was irrigated with sterile saline. A single lumen thin power injectable port was chosen for placement. The 8 Fr catheter was tunneled from the port pocket site to the venotomy incision. The port was placed in the pocket. The external catheter was trimmed to appropriate length. At the venotomy, an 8 Fr peel-away sheath was placed over a guidewire under fluoroscopic guidance. The catheter was then placed through the sheath and the sheath was removed. Final catheter positioning was confirmed and documented with a fluoroscopic spot radiograph. The port was accessed with a Huber needle, aspirated and flushed with heparinized saline. The  venotomy site was closed with an interrupted 4-0 Vicryl suture. The port pocket incision was closed with interrupted 2-0 Vicryl suture and the skin was opposed with a running subcuticular 4-0 Vicryl suture. Dermabond and Steri-strips were applied to both incisions. Dressings were placed. The patient tolerated the procedure well without immediate post procedural complication. FINDINGS: After catheter placement, the tip lies within the superior cavoatrial junction. The catheter aspirates and flushes normally and is ready for immediate use. IMPRESSION: Successful placement of a right internal jugular approach power injectable Port-A-Cath. The catheter is ready for immediate use. Electronically Signed   By: Sandi Mariscal M.D.   On: 12/07/2017 15:33     Assessment & Plan:   No  problem-specific Assessment & Plan notes found for this encounter.     Rexene Edison, NP 12/28/2017

## 2017-12-28 NOTE — Telephone Encounter (Signed)
Called pt & spoke to her husband.  He states he called the phone # Magda Paganini gave him and he was able to get everything taking care of.  Huey Romans is delivering O2 this afternoon.  Nothing further needed.

## 2017-12-29 ENCOUNTER — Encounter: Payer: Self-pay | Admitting: Hematology and Oncology

## 2017-12-29 ENCOUNTER — Telehealth: Payer: Self-pay

## 2017-12-29 DIAGNOSIS — K5909 Other constipation: Secondary | ICD-10-CM | POA: Insufficient documentation

## 2017-12-29 NOTE — Assessment & Plan Note (Signed)
She had recent severe constipation We have extensive discussion about laxative therapy

## 2017-12-29 NOTE — Telephone Encounter (Signed)
Called and given Radiology scheduling and instructed to call to schedule. Verbalized understanding.

## 2017-12-29 NOTE — Assessment & Plan Note (Signed)
She tolerated cycle 1 of treatment poorly, complicated by infection, severe constipation, weakness, hypoxia and others I suspect it is because of recurrent treatment that causes more profound bone marrow suppression I recommend mini R-CHOP approach I plan to reduce a dose of future doxorubicin, vincristine and Cytoxan but to keep rituximab at full dose as well as prednisone 40 mg for 4 days of the chemotherapy She will continue G-CSF support I will schedule prophylactic CNS prophylaxis with IT chemotherapy with methotrexate The plan is discussed with the patient and husband and they agreed to proceed

## 2017-12-29 NOTE — Assessment & Plan Note (Signed)
She might have recent COPD exacerbation secondary to influenza infection She was evaluated by pulmonologist and is prescribed oxygen therapy.

## 2017-12-29 NOTE — Progress Notes (Signed)
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[facility]) She tolerated cycle 1 of treatment poorly, complicated by infection, severe constipation, weakness, hypoxia and others I suspect it is because of recurrent treatment that causes more profound bone marrow suppression I recommend mini R-CHOP approach I plan to reduce a dose of future doxorubicin, vincristine and Cytoxan but to keep rituximab at full dose as well as prednisone 40 mg for 4 days of the chemotherapy She will continue G-CSF support I will schedule prophylactic CNS prophylaxis with IT chemotherapy with methotrexate The plan is discussed with the patient and husband and they agreed to proceed  Other constipation She had recent severe constipation We have extensive discussion about laxative therapy  COPD GOLD II She might have recent COPD exacerbation secondary to influenza infection She was evaluated by pulmonologist and is prescribed oxygen therapy.  Mucositis due to chemotherapy She has mild persistent mucositis I recommend continue to use Magic mouthwash   No orders of the defined types were placed in this encounter.   INTERVAL HISTORY: Please see below for problem oriented charting. She returns with her husband for further follow-up She has ongoing issues with mucositis pain, shortness of breath on minimum exertion and poor appetite She has lost some weight since last time I saw her She denies cough or fever Her constipation has resolved with aggressive laxative therapy She has not seen her orthopedic surgeon since surgery due to multiple appointments She denies bone pain. 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 She was treated with chlorambucil in 2000.   --2007 - 09/2008.  Treated with single agent Rituxan.   --12/2008.  She had progressive adenopathy in chest and abdomen and repeat biopsy showed low grade follicular and diffuse B cell NHL.   --01/2009 - 07/2009. She required left ureteral stent and was treated with Treanda/Rituxan x 5 cycles from 02-2009 thru 06-2009.       11/20/2008 Imaging    Mildly displaced and angulated pathologic fracture of the proximal humeral shaft. 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       09/16/2014 Imaging    No lymphadenopathy in the chest, abdomen, or pelvis. The small right axillary lymph nodes seen on the previous exam are unchanged in the 18 month interval. Small to moderate hiatal hernia.      11/22/2017 PET scan    New hypermetabolic lymphadenopathy within the chest and abdomen, consistent with recurrent lymphoma. New large hypermetabolic mass in the right hepatic lobe, consistent with lymphoma this involvement. New small hypermetabolic mass in the left kidney, which may be due to lymphomatous involvement although primary renal cell carcinoma cannot be excluded. Hypermetabolic mass in the right humerus with pathologic fracture, consistent with lymphomatous involvement. Small hypermetabolic foci in right posterior chest wall, also suspicious for recurrent lymphoma.      11/23/2017 Procedure    Bone, fragment(s), Right Humeral Shaft Reamings - ATYPICAL LYMPHOID INFILTRATE CONSISTENT WITH NON-HODGKIN'S B CELL LYMPHOMA. - SEE ONCOLOGY TABLE.  Microscopic Comment LYMPHOMA Histologic type: Non-Hodgkins lymphoma, favor large cell type. Grade (if applicable): Favor high grade. Flow cytometry: No specimen is  available for analysis. Immunohistochemical stains: BCL-2, BCL-6, CD3, CD5, CD10, CD15, CD20, CD30, CD34, CD43, LCA, CD79a, CD138, Ki-67, PAX-5 and TdT with appropriate controls as performed on block 1C. Touch preps/imprints: Not performed. Comments: The sections show multiple, primarily soft tissue fragments displaying a dense lymphoid infiltrate characterized by a mixture of small round to slightly irregular lymphocytes, histiocytes, and large centroblastic appearing lymphoid cells with partially clumped to vesicular chromatin, small nucleoli and amphophilic to clear cytoplasm. The number of admixed large lymphoid cells is variable but they appear relatively abundant in some areas associated with clustering. No material is available for flow cytometric analysis. Hence, a large batter of immunohistochemical stains were performed and show a mixture of T and B cells but with a significant B cell component mostly composed of large lymphoid cells with positivity for CD79a, PAX-5, CD30, BCL-2, and BCL-6. LCA shows diffuse staining although some of the large lymphoid cells appear negative. No significant staining is seen with CD20, CD10, CD15, CD34, TdT, or CD138. The lack of CD20 expression in this material is possibly related to prior treatment. There is variable increased expression of Ki-67 ranging from 10 to >50% in some areas particularly where there is abundance of large lymphoid cells. There is admixed abundant T cell population in the background as seen with CD3, CD5, and CD43 with on apparent co-expression of CD5 in B cell areas. The overall findings are consistent with involvement by non-Hodgkin's B cell lymphoma and the presence of relative abundance of large lymphoid B-cells and increased Ki-67 expression favor a high grade large B cell lymphoma.      11/23/2017 Surgery    1. CPT 24516-Intramedullary nailing of right humerus fracture 2. CPT 20245-Bone biopsy, deep        11/23/2017 - 11/25/2017  Hospital Admission    She was admitted to the hospital for management of humerus fracture and aggressive lymphoma      11/24/2017 Imaging    ECHO showed LV EF: 65% -   70%      11/25/2017 Procedure    Status post ultrasound-guided biopsy of liver mass. Tissue specimen sent to pathology for complete histopathologic analysis      12/07/2017 Imaging    Successful placement of a right internal jugular approach power injectable Port-A-Cath. The catheter is ready for immediate use.      12/09/2017 -  Chemotherapy    The patient had R-CHOP chemo        12/17/2017 - 12/20/2017 Hospital Admission    She was hospitalized for influenza infection       REVIEW OF SYSTEMS:   Constitutional: Denies fevers, chills or abnormal weight loss Eyes: Denies blurriness of vision Cardiovascular: Denies palpitation, chest discomfort or lower extremity swelling Gastrointestinal:  Denies nausea, heartburn or change in bowel habits Skin: Denies abnormal skin rashes Lymphatics: Denies new lymphadenopathy or easy bruising Neurological:Denies numbness, tingling or new weaknesses Behavioral/Psych: Mood is stable, no new changes  All other systems were reviewed with the patient and are negative.  I have reviewed the past medical history, past surgical history, social history and family history with the patient and they are unchanged from previous note.  ALLERGIES:  is allergic to contrast media [iodinated diagnostic agents]; iohexol; povidone-iodine; penicillins; and adhesive [tape].  MEDICATIONS:  Current Outpatient Medications  Medication Sig Dispense Refill  . acetaminophen (TYLENOL) 325 MG tablet  Take 2 tablets (650 mg total) by mouth every 6 (six) hours as needed for mild pain (or Fever >/= 101).    Marland Kitchen albuterol (PROVENTIL HFA;VENTOLIN HFA) 108 (90 Base) MCG/ACT inhaler Inhale 2 puffs into the lungs every 6 (six) hours as needed for wheezing or shortness of breath. 1 Inhaler 2  . allopurinol (ZYLOPRIM) 300  MG tablet Take 1 tablet (300 mg total) by mouth daily. 30 tablet 3  . aspirin 81 MG tablet Take 81 mg by mouth daily.    . ciprofloxacin (CIPRO) 500 MG tablet Take 1 tablet (500 mg total) by mouth 2 (two) times daily. One po bid x 7 days 14 tablet 0  . citalopram (CELEXA) 40 MG tablet Take 1 tablet (40 mg total) by mouth daily. 90 tablet 1  . fluticasone furoate-vilanterol (BREO ELLIPTA) 100-25 MCG/INH AEPB Inhale 1 puff into the lungs every morning.    Marland Kitchen HYDROcodone-acetaminophen (NORCO/VICODIN) 5-325 MG tablet Take 1 tablet by mouth every 4 (four) hours as needed for moderate pain. (Patient taking differently: Take 0.5-1 tablets by mouth every 4 (four) hours as needed for moderate pain. ) 60 tablet 0  . ibuprofen (ADVIL,MOTRIN) 200 MG tablet Take 200 mg by mouth every 6 (six) hours as needed for moderate pain.    . magic mouthwash w/lidocaine SOLN Take 10 mLs by mouth 4 (four) times daily. Swish and swallow 10 ml QID (Patient taking differently: Take 10 mLs by mouth 4 (four) times daily as needed for mouth pain. Swish and swallow 10 ml QID) 240 mL 0  . ondansetron (ZOFRAN) 8 MG tablet Take 1 tablet (8 mg total) by mouth every 8 (eight) hours as needed for nausea. 30 tablet 3  . pantoprazole (PROTONIX) 40 MG tablet Take 1 tablet (40 mg total) by mouth daily. 90 tablet 3  . predniSONE (DELTASONE) 20 MG tablet Take 2 tablets (40 mg total) by mouth daily. Start 40 mg daily on 2/23. After chemo, take 40 mg for 4 days 60 tablet 6  . prochlorperazine (COMPAZINE) 10 MG tablet Take 1 tablet (10 mg total) by mouth every 6 (six) hours as needed (Nausea or vomiting). 30 tablet 6  . senna (SENOKOT) 8.6 MG TABS tablet Take 2 tablets (17.2 mg total) by mouth daily as needed for mild constipation. 60 each 0   No current facility-administered medications for this visit.     PHYSICAL EXAMINATION: ECOG PERFORMANCE STATUS: 1 - Symptomatic but completely ambulatory  Vitals:   12/28/17 1103  BP: 132/72  Pulse:  (!) 105  Resp: 18  SpO2: 94%   Filed Weights   12/28/17 1103  Weight: 125 lb 11.2 oz (57 kg)    GENERAL:alert, no distress and comfortable SKIN: skin color, texture, turgor are normal, no rashes or significant lesions EYES: normal, Conjunctiva are pink and non-injected, sclera clear OROPHARYNX:no exudate, no erythema and lips, buccal mucosa, and tongue normal  NECK: supple, thyroid normal size, non-tender, without nodularity LYMPH:  no palpable lymphadenopathy in the cervical, axillary or inguinal LUNGS: clear to auscultation and percussion with normal breathing effort HEART: regular rate & rhythm and no murmurs and no lower extremity edema ABDOMEN:abdomen soft, non-tender and normal bowel sounds Musculoskeletal:no cyanosis of digits and no clubbing  NEURO: alert & oriented x 3 with fluent speech, no focal motor/sensory deficits  LABORATORY DATA:  I have reviewed the data as listed    Component Value Date/Time   NA 140 12/28/2017 1041   NA 142 12/28/2016 1002   K  3.7 12/28/2017 1041   K 4.6 12/28/2016 1002   CL 103 12/28/2017 1041   CL 101 03/28/2013 0826   CO2 27 12/28/2017 1041   CO2 29 12/28/2016 1002   GLUCOSE 91 12/28/2017 1041   GLUCOSE 85 12/28/2016 1002   GLUCOSE 84 03/28/2013 0826   BUN 8 12/28/2017 1041   BUN 20.3 12/28/2016 1002   CREATININE 0.78 12/28/2017 1041   CREATININE 0.9 12/28/2016 1002   CALCIUM 9.9 12/28/2017 1041   CALCIUM 10.1 12/28/2016 1002   PROT 6.6 12/28/2017 1041   PROT 6.8 12/28/2016 1002   ALBUMIN 3.2 (L) 12/28/2017 1041   ALBUMIN 4.2 12/28/2016 1002   AST 27 12/28/2017 1041   AST 20 12/28/2016 1002   ALT 22 12/28/2017 1041   ALT 12 12/28/2016 1002   ALKPHOS 156 (H) 12/28/2017 1041   ALKPHOS 111 12/28/2016 1002   BILITOT 0.4 12/28/2017 1041   BILITOT 0.55 12/28/2016 1002   GFRNONAA >60 12/28/2017 1041   GFRAA >60 12/28/2017 1041    No results found for: SPEP, UPEP  Lab Results  Component Value Date   WBC 14.8 (H) 12/28/2017    NEUTROABS 12.5 (H) 12/28/2017   HGB 10.1 (L) 12/24/2017   HCT 33.3 (L) 12/28/2017   MCV 101.5 (H) 12/28/2017   PLT 512 (H) 12/28/2017      Chemistry      Component Value Date/Time   NA 140 12/28/2017 1041   NA 142 12/28/2016 1002   K 3.7 12/28/2017 1041   K 4.6 12/28/2016 1002   CL 103 12/28/2017 1041   CL 101 03/28/2013 0826   CO2 27 12/28/2017 1041   CO2 29 12/28/2016 1002   BUN 8 12/28/2017 1041   BUN 20.3 12/28/2016 1002   CREATININE 0.78 12/28/2017 1041   CREATININE 0.9 12/28/2016 1002      Component Value Date/Time   CALCIUM 9.9 12/28/2017 1041   CALCIUM 10.1 12/28/2016 1002   ALKPHOS 156 (H) 12/28/2017 1041   ALKPHOS 111 12/28/2016 1002   AST 27 12/28/2017 1041   AST 20 12/28/2016 1002   ALT 22 12/28/2017 1041   ALT 12 12/28/2016 1002   BILITOT 0.4 12/28/2017 1041   BILITOT 0.55 12/28/2016 1002       RADIOGRAPHIC STUDIES: I have personally reviewed the radiological images as listed and agreed with the findings in the report. Ct Abdomen Pelvis Wo Contrast  Result Date: 12/24/2017 CLINICAL DATA:  Abdominal distension. Patient reports no bowel movement for 7 days. EXAM: CT ABDOMEN AND PELVIS WITHOUT CONTRAST TECHNIQUE: Multidetector CT imaging of the abdomen and pelvis was performed following the standard protocol without IV contrast. COMPARISON:  Radiograph 12/18/2017.  PET-CT 11/22/2017 FINDINGS: Lower chest: Mild bibasilar scarring. Chronic bilateral lower lobe bronchiectasis. No pleural fluid. No consolidation. Moderate hiatal hernia. Hepatobiliary: Ill-defined low density in the medial right hepatic lobe at site of hypermetabolic mass on prior PET-CT, not well characterized without contrast, but possibly decreased in size. Gallbladder physiologically distended, no calcified stone. No biliary dilatation. Pancreas: Parenchymal atrophy. No ductal dilatation or inflammation. Spleen: Normal in size.  No focal abnormality on noncontrast exam. Adrenals/Urinary Tract:  Unchanged right adrenal thickening. Left adrenal gland is normal. Thinning of bilateral renal parenchyma without hydronephrosis. The hypermetabolic left kidney lesion on prior PET is not well characterized on the current exam without contrast. No perinephric stranding. Urinary bladder is partially distended. No bladder wall thickening. Stomach/Bowel: Large diffuse colonic stool burden with relative transition to decompressed colon involving the mid sigmoid,  best appreciated on image 45. There is mild pericolonic edema in this region without well-defined colonic mass. Colonic diverticulosis without diverticulitis. Small amount of pericolonic stranding involves the splenic flexure and descending colon with small amount of fluid in the pericolic gutter. No small bowel dilatation, inflammation or obstruction. Stomach is nondistended, small to moderate hiatal hernia. Vascular/Lymphatic: Decreased porta hepatis adenopathy, not well-defined in the absence of contrast. Right cardiophrenic angle lymph nodes have decreased in size from prior PET. Retroperitoneal adenopathy is diminished with mild persistent periaortic and aortocaval soft tissue density. No evidence of new or progressive intra-abdominal/pelvic adenopathy. Moderate aortic atherosclerosis. Reproductive: Small uterine calcifications may be fibroids. No adnexal mass. Other: Minimal free fluid in the pericolic gutter. No free air or intra-abdominal abscess. Subcutaneous soft tissue density overlies the left gluteal muscles. Musculoskeletal: Scoliosis and mild degenerative change in the spine. Unchanged sclerotic densities in the right proximal femur and right inferior pubic ramus are not hypermetabolic on prior PET. IMPRESSION: 1. Large colonic stool burden with relative transition to decompressed colon in the mid sigmoid, with mild associated pericolonic stranding. This may reflect focal stricture or colitis. Additional stranding about the splenic flexure and  descending colon without definite colonic wall thickening at the sites is nonspecific, but can be seen with stercoral disease. No perforation or free air. No small bowel dilatation. 2. Improved intra-abdominal lymphadenopathy from PET CT last month. Right hepatic lobe lesion is not well-defined without contrast but has likely decreased in size. The hypermetabolic left renal lesion on prior PET is not well-defined in the absence of contrast. No evidence of progressive intra-abdominal/pelvic adenopathy. 3. Patchy subcutaneous density overlies the left gluteal muscles with mild surrounding edema. This may reflect hematoma if there is history of injury, recommend correlation with clinical history. Given history of lymphoma, lymphomatous involvement is not excluded. Recommend attention at follow-up PET imaging. 4.  Aortic Atherosclerosis (ICD10-I70.0). Electronically Signed   By: Jeb Levering M.D.   On: 12/24/2017 23:17   Dg Chest 2 View  Result Date: 12/24/2017 CLINICAL DATA:  Hypoxia. EXAM: CHEST - 2 VIEW COMPARISON:  12/17/2017 FINDINGS: Tip of the right chest port in the mid SVC. Minimal aortic atherosclerosis. The cardiomediastinal contours are normal. The lungs are clear. Pulmonary vasculature is normal. No consolidation, pleural effusion, or pneumothorax. No acute osseous abnormalities are seen. Intramedullary rod and screw fixation of pathologic mid humerus fracture. IMPRESSION: No acute abnormality or explanation for hypoxia. Electronically Signed   By: Jeb Levering M.D.   On: 12/24/2017 22:41   Dg Chest 2 View  Result Date: 12/17/2017 CLINICAL DATA:  Acute onset of generalized weakness and lethargy. Mild hypoxia. Productive cough and malaise. EXAM: CHEST - 2 VIEW COMPARISON:  Chest radiograph performed 05/17/2017 FINDINGS: The lungs are well-aerated and clear. There is no evidence of focal opacification, pleural effusion or pneumothorax. The heart is normal in size; the mediastinal contour is  within normal limits. No acute osseous abnormalities are seen. Right humeral hardware is incompletely imaged but appears grossly unremarkable, transfixing the right humeral diaphyseal fracture. A right-sided chest port is noted ending about the mid to distal SVC. IMPRESSION: No acute cardiopulmonary process seen. Electronically Signed   By: Garald Balding M.D.   On: 12/17/2017 22:30   Dg Abd 1 View  Result Date: 12/18/2017 CLINICAL DATA:  Acute onset of generalized abdominal fullness. EXAM: ABDOMEN - 1 VIEW COMPARISON:  PET/CT performed 11/22/2017 FINDINGS: The visualized bowel gas pattern is unremarkable. Scattered air and stool filled loops of colon  are seen; no abnormal dilatation of small bowel loops is seen to suggest small bowel obstruction. No free intra-abdominal air is identified, though evaluation for free air is limited on a single supine view. The visualized osseous structures are within normal limits; the sacroiliac joints are unremarkable in appearance. The visualized lung bases are essentially clear. IMPRESSION: Unremarkable bowel gas pattern; no free intra-abdominal air seen. Moderate amount of stool noted in the colon. Electronically Signed   By: Garald Balding M.D.   On: 12/18/2017 01:46   Ir US Guide Vasc Access Right  Result Date: 12/07/2017 INDICATION: History of lymphoma. In need of durable intravenous access for chemotherapy administration. EXAM: IMPLANTED PORT A CATH PLACEMENT WITH ULTRASOUND AND FLUOROSCOPIC GUIDANCE COMPARISON:  PET-CT - 11/22/2016 MEDICATIONS: Clindamycin 600 mg IV; The antibiotic was administered within an appropriate time interval prior to skin puncture. ANESTHESIA/SEDATION: Moderate (conscious) sedation was employed during this procedure. A total of Versed 1 mg and Fentanyl 75 mcg was administered intravenously. Moderate Sedation Time: 30 minutes. The patient's level of consciousness and vital signs were monitored continuously by radiology nursing throughout the  procedure under my direct supervision. CONTRAST:  None FLUOROSCOPY TIME:  54 seconds (5 mGy) COMPLICATIONS: None immediate. PROCEDURE: The procedure, risks, benefits, and alternatives were explained to the patient. Questions regarding the procedure were encouraged and answered. The patient understands and consents to the procedure. The right neck and chest were prepped with chlorhexidine in a sterile fashion, and a sterile drape was applied covering the operative field. Maximum barrier sterile technique with sterile gowns and gloves were used for the procedure. A timeout was performed prior to the initiation of the procedure. Local anesthesia was provided with 1% lidocaine with epinephrine. After creating a small venotomy incision, a micropuncture kit was utilized to access the internal jugular vein. Real-time ultrasound guidance was utilized for vascular access including the acquisition of a permanent ultrasound image documenting patency of the accessed vessel. The microwire was utilized to measure appropriate catheter length. A subcutaneous port pocket was then created along the upper chest wall utilizing a combination of sharp and blunt dissection. The pocket was irrigated with sterile saline. A single lumen thin power injectable port was chosen for placement. The 8 Fr catheter was tunneled from the port pocket site to the venotomy incision. The port was placed in the pocket. The external catheter was trimmed to appropriate length. At the venotomy, an 8 Fr peel-away sheath was placed over a guidewire under fluoroscopic guidance. The catheter was then placed through the sheath and the sheath was removed. Final catheter positioning was confirmed and documented with a fluoroscopic spot radiograph. The port was accessed with a Huber needle, aspirated and flushed with heparinized saline. The venotomy site was closed with an interrupted 4-0 Vicryl suture. The port pocket incision was closed with interrupted 2-0 Vicryl  suture and the skin was opposed with a running subcuticular 4-0 Vicryl suture. Dermabond and Steri-strips were applied to both incisions. Dressings were placed. The patient tolerated the procedure well without immediate post procedural complication. FINDINGS: After catheter placement, the tip lies within the superior cavoatrial junction. The catheter aspirates and flushes normally and is ready for immediate use. IMPRESSION: Successful placement of a right internal jugular approach power injectable Port-A-Cath. The catheter is ready for immediate use. Electronically Signed   By: Sandi Mariscal M.D.   On: 12/07/2017 15:33   Ir Fluoro Guide Port Insertion Right  Result Date: 12/07/2017 INDICATION: History of lymphoma. In need of durable intravenous  access for chemotherapy administration. EXAM: IMPLANTED PORT A CATH PLACEMENT WITH ULTRASOUND AND FLUOROSCOPIC GUIDANCE COMPARISON:  PET-CT - 11/22/2016 MEDICATIONS: Clindamycin 600 mg IV; The antibiotic was administered within an appropriate time interval prior to skin puncture. ANESTHESIA/SEDATION: Moderate (conscious) sedation was employed during this procedure. A total of Versed 1 mg and Fentanyl 75 mcg was administered intravenously. Moderate Sedation Time: 30 minutes. The patient's level of consciousness and vital signs were monitored continuously by radiology nursing throughout the procedure under my direct supervision. CONTRAST:  None FLUOROSCOPY TIME:  54 seconds (5 mGy) COMPLICATIONS: None immediate. PROCEDURE: The procedure, risks, benefits, and alternatives were explained to the patient. Questions regarding the procedure were encouraged and answered. The patient understands and consents to the procedure. The right neck and chest were prepped with chlorhexidine in a sterile fashion, and a sterile drape was applied covering the operative field. Maximum barrier sterile technique with sterile gowns and gloves were used for the procedure. A timeout was performed  prior to the initiation of the procedure. Local anesthesia was provided with 1% lidocaine with epinephrine. After creating a small venotomy incision, a micropuncture kit was utilized to access the internal jugular vein. Real-time ultrasound guidance was utilized for vascular access including the acquisition of a permanent ultrasound image documenting patency of the accessed vessel. The microwire was utilized to measure appropriate catheter length. A subcutaneous port pocket was then created along the upper chest wall utilizing a combination of sharp and blunt dissection. The pocket was irrigated with sterile saline. A single lumen thin power injectable port was chosen for placement. The 8 Fr catheter was tunneled from the port pocket site to the venotomy incision. The port was placed in the pocket. The external catheter was trimmed to appropriate length. At the venotomy, an 8 Fr peel-away sheath was placed over a guidewire under fluoroscopic guidance. The catheter was then placed through the sheath and the sheath was removed. Final catheter positioning was confirmed and documented with a fluoroscopic spot radiograph. The port was accessed with a Huber needle, aspirated and flushed with heparinized saline. The venotomy site was closed with an interrupted 4-0 Vicryl suture. The port pocket incision was closed with interrupted 2-0 Vicryl suture and the skin was opposed with a running subcuticular 4-0 Vicryl suture. Dermabond and Steri-strips were applied to both incisions. Dressings were placed. The patient tolerated the procedure well without immediate post procedural complication. FINDINGS: After catheter placement, the tip lies within the superior cavoatrial junction. The catheter aspirates and flushes normally and is ready for immediate use. IMPRESSION: Successful placement of a right internal jugular approach power injectable Port-A-Cath. The catheter is ready for immediate use. Electronically Signed   By: Sandi Mariscal M.D.   On: 12/07/2017 15:33    All questions were answered. The patient knows to call the clinic with any problems, questions or concerns. No barriers to learning was detected.  I spent 30 minutes counseling the patient face to face. The total time spent in the appointment was 40 minutes and more than 50% was on counseling and review of test results  [name], MD 12/29/2017 9:21 AM

## 2017-12-29 NOTE — Assessment & Plan Note (Signed)
She has mild persistent mucositis I recommend continue to use Magic mouthwash

## 2017-12-30 DIAGNOSIS — J9611 Chronic respiratory failure with hypoxia: Secondary | ICD-10-CM | POA: Insufficient documentation

## 2017-12-30 NOTE — Assessment & Plan Note (Signed)
Now with desats with activity . 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 She is very debilitiated.  Would begin O2 to keep sats >88-90%.   Plan  Patient Instructions  Continue on BREO daily.  Begin Oxygen 2l/m with activity .  Check ONO .  POC order  Follow up with Dr. Melvyn Novas or Parrett NP   In 6-8 weeks and As needed

## 2017-12-30 NOTE — Assessment & Plan Note (Signed)
Recent flare with influenza , now resolving  Issues with oxygenation with activity now . Begin O2 . Reassess on return may be able to wean off going forward.    Plan Patient Instructions  Continue on BREO daily.  Begin Oxygen 2l/m with activity .  Check ONO .  POC order  Follow up with Dr. Melvyn Novas or Anthem Frazer NP   In 6-8 weeks and As needed

## 2018-01-02 ENCOUNTER — Ambulatory Visit: Payer: Medicare Other | Admitting: Hematology and Oncology

## 2018-01-02 ENCOUNTER — Other Ambulatory Visit: Payer: Medicare Other

## 2018-01-02 ENCOUNTER — Inpatient Hospital Stay: Payer: Medicare Other

## 2018-01-02 VITALS — BP 105/61 | HR 99 | Temp 97.8°F | Resp 17 | Ht 64.0 in | Wt 120.8 lb

## 2018-01-02 DIAGNOSIS — G893 Neoplasm related pain (acute) (chronic): Secondary | ICD-10-CM | POA: Diagnosis not present

## 2018-01-02 DIAGNOSIS — Z5111 Encounter for antineoplastic chemotherapy: Secondary | ICD-10-CM | POA: Diagnosis not present

## 2018-01-02 DIAGNOSIS — K1231 Oral mucositis (ulcerative) due to antineoplastic therapy: Secondary | ICD-10-CM | POA: Diagnosis not present

## 2018-01-02 DIAGNOSIS — C8338 Diffuse large B-cell lymphoma, lymph nodes of multiple sites: Secondary | ICD-10-CM | POA: Diagnosis not present

## 2018-01-02 DIAGNOSIS — R110 Nausea: Secondary | ICD-10-CM | POA: Diagnosis not present

## 2018-01-02 DIAGNOSIS — Z5112 Encounter for antineoplastic immunotherapy: Secondary | ICD-10-CM | POA: Diagnosis not present

## 2018-01-02 MED ORDER — HEPARIN SOD (PORK) LOCK FLUSH 100 UNIT/ML IV SOLN
500.0000 [IU] | Freq: Once | INTRAVENOUS | Status: AC | PRN
  Administered 2018-01-02: 500 [IU]
  Filled 2018-01-02: qty 5

## 2018-01-02 MED ORDER — ACETAMINOPHEN 325 MG PO TABS
650.0000 mg | ORAL_TABLET | Freq: Once | ORAL | Status: AC
  Administered 2018-01-02: 650 mg via ORAL

## 2018-01-02 MED ORDER — ACETAMINOPHEN 325 MG PO TABS
ORAL_TABLET | ORAL | Status: AC
Start: 2018-01-02 — End: 2018-01-02
  Filled 2018-01-02: qty 2

## 2018-01-02 MED ORDER — DIPHENHYDRAMINE HCL 25 MG PO CAPS
ORAL_CAPSULE | ORAL | Status: AC
  Filled 2018-01-02: qty 2

## 2018-01-02 MED ORDER — CYCLOPHOSPHAMIDE CHEMO INJECTION 1 GM
375.0000 mg/m2 | Freq: Once | INTRAMUSCULAR | Status: AC
  Administered 2018-01-02: 640 mg via INTRAVENOUS
  Filled 2018-01-02: qty 32

## 2018-01-02 MED ORDER — SODIUM CHLORIDE 0.9 % IV SOLN
Freq: Once | INTRAVENOUS | Status: AC
  Administered 2018-01-02: 11:00:00 via INTRAVENOUS
  Filled 2018-01-02: qty 5

## 2018-01-02 MED ORDER — DIPHENHYDRAMINE HCL 25 MG PO CAPS
50.0000 mg | ORAL_CAPSULE | Freq: Once | ORAL | Status: AC
  Administered 2018-01-02: 50 mg via ORAL

## 2018-01-02 MED ORDER — SODIUM CHLORIDE 0.9 % IV SOLN
600.0000 mg | Freq: Once | INTRAVENOUS | Status: AC
  Administered 2018-01-02: 600 mg via INTRAVENOUS
  Filled 2018-01-02: qty 50

## 2018-01-02 MED ORDER — PALONOSETRON HCL INJECTION 0.25 MG/5ML
INTRAVENOUS | Status: AC
Start: 2018-01-02 — End: 2018-01-02
  Filled 2018-01-02: qty 5

## 2018-01-02 MED ORDER — VINCRISTINE SULFATE CHEMO INJECTION 1 MG/ML
1.0000 mg | Freq: Once | INTRAVENOUS | Status: AC
  Administered 2018-01-02: 1 mg via INTRAVENOUS
  Filled 2018-01-02: qty 1

## 2018-01-02 MED ORDER — SODIUM CHLORIDE 0.9 % IV SOLN
Freq: Once | INTRAVENOUS | Status: AC
  Administered 2018-01-02: 11:00:00 via INTRAVENOUS

## 2018-01-02 MED ORDER — DOXORUBICIN HCL CHEMO IV INJECTION 2 MG/ML
25.0000 mg/m2 | Freq: Once | INTRAVENOUS | Status: AC
  Administered 2018-01-02: 42 mg via INTRAVENOUS
  Filled 2018-01-02: qty 21

## 2018-01-02 MED ORDER — SODIUM CHLORIDE 0.9 % IV SOLN
INTRAVENOUS | Status: DC
  Administered 2018-01-02: 15:00:00 via INTRAVENOUS

## 2018-01-02 MED ORDER — PALONOSETRON HCL INJECTION 0.25 MG/5ML
0.2500 mg | Freq: Once | INTRAVENOUS | Status: AC
  Administered 2018-01-02: 0.25 mg via INTRAVENOUS

## 2018-01-02 MED ORDER — SODIUM CHLORIDE 0.9% FLUSH
10.0000 mL | INTRAVENOUS | Status: DC | PRN
  Administered 2018-01-02: 10 mL
  Filled 2018-01-02: qty 10

## 2018-01-02 NOTE — Patient Instructions (Signed)
[facility] Discharge Instructions for Patients Receiving Chemotherapy  Today you received the following chemotherapy agents :  Adriamycin, Vincristine, Cytoxan, Rituxan.  To help prevent nausea and vomiting after your treatment, we encourage you to take your nausea medication as prescribed.   If you develop nausea and vomiting that is not controlled by your nausea medication, call the clinic.   BELOW ARE SYMPTOMS THAT SHOULD BE REPORTED IMMEDIATELY:  *FEVER GREATER THAN 100.5 F  *CHILLS WITH OR WITHOUT FEVER  NAUSEA AND VOMITING THAT IS NOT CONTROLLED WITH YOUR NAUSEA MEDICATION  *UNUSUAL SHORTNESS OF BREATH  *UNUSUAL BRUISING OR BLEEDING  TENDERNESS IN MOUTH AND THROAT WITH OR WITHOUT PRESENCE OF ULCERS  *URINARY PROBLEMS  *BOWEL PROBLEMS  UNUSUAL RASH Items with * indicate a potential emergency and should be followed up as soon as possible.  Feel free to call the clinic should you have any questions or concerns. The clinic phone number is [phone].  Please show the CHEMO ALERT CARD at check-in to the Emergency Department and triage nurse.   

## 2018-01-02 NOTE — Progress Notes (Signed)
1447:  Rituxan paused d/t drop in BP. Pt otherwise asymptomatic except "drowsy from the benadryl". NS to gravity infusing.  Awaiting to hear back from Dr Alvy Bimler.

## 2018-01-03 ENCOUNTER — Ambulatory Visit (HOSPITAL_COMMUNITY)
Admission: RE | Admit: 2018-01-03 | Discharge: 2018-01-03 | Disposition: A | Discharge status: Home / Self Care | Payer: Medicare Other | Source: Ambulatory Visit | Attending: Hematology and Oncology | Admitting: Hematology and Oncology

## 2018-01-03 ENCOUNTER — Ambulatory Visit: Payer: Medicare Other | Admitting: Hematology and Oncology

## 2018-01-03 ENCOUNTER — Other Ambulatory Visit: Payer: Medicare Other

## 2018-01-03 ENCOUNTER — Encounter (HOSPITAL_COMMUNITY): Payer: Self-pay

## 2018-01-03 DIAGNOSIS — Z5111 Encounter for antineoplastic chemotherapy: Secondary | ICD-10-CM | POA: Diagnosis not present

## 2018-01-03 DIAGNOSIS — D649 Anemia, unspecified: Secondary | ICD-10-CM | POA: Diagnosis not present

## 2018-01-03 DIAGNOSIS — C8338 Diffuse large B-cell lymphoma, lymph nodes of multiple sites: Secondary | ICD-10-CM | POA: Insufficient documentation

## 2018-01-03 DIAGNOSIS — C8330 Diffuse large B-cell lymphoma, unspecified site: Secondary | ICD-10-CM | POA: Diagnosis not present

## 2018-01-03 MED ORDER — LIDOCAINE 1% INJECTION FOR CIRCUMCISION
20.0000 mL | INJECTION | Freq: Once | INTRAVENOUS | Status: AC
  Administered 2018-01-03: 20 mL via SUBCUTANEOUS
  Filled 2018-01-03: qty 20

## 2018-01-03 MED ORDER — SODIUM CHLORIDE 0.9 % IJ SOLN
Freq: Once | INTRAMUSCULAR | Status: AC
  Administered 2018-01-03: 13:00:00 via INTRATHECAL
  Filled 2018-01-03: qty 0.48

## 2018-01-03 NOTE — CV Procedure (Signed)
Procedure discussed with Dr. Alvy Bimler.  Prior imaging reviewed.  Procedure and associated risks reviewed with patient.  Questions answered and written consent obtained.  Time out performed.  Patient prepped with alcohol after marking L4-5 and L2-3 level (allergic to iodine).  1% Lidocaine local anesthetic.  Under fluoroscopic guidance, L4-5 LP performed with single pass of 22 g spinal needle.  Intial clear CSF with 0.5 cc collected (tube 1).  Flow stopped and with readjustment minimal blood with poor flow.  1% Lidocaine L2-3 level.  L2-3 LP performed with single pass 22 g needle.  CSF minimally blood tinged but with adequate flow. 1 cc collected (tube 2) and sent for cytology.  12 mg Methotrexate instilled slowly.  No immediate complications.

## 2018-01-03 NOTE — Discharge Instructions (Signed)
Please remain flat for the remainder of the day. You may remove dressing tomorrow.    Intrathecal Chemo, Care After  Refer to this sheet in the next few weeks. These instructions provide you with information about caring for yourself after your procedure. Your health care provider may also give you more specific instructions. Your treatment has been planned according to current medical practices, but problems sometimes occur. Call your health care provider if you have any problems or questions after your procedure.  What can I expect after the procedure? After the procedure, it is common to have:  Pain in your back and abdomen.  A slight headache.  Follow these instructions at home: Incision care   Follow instructions from your health care provider about how to take care of your incisions. Make sure you: ? Wash your hands with soap and water before you change your bandage (dressing). If soap and water are not available, use hand sanitizer. ? Change your dressing as told by your health care provider. ? Leave stitches (sutures), skin glue, or adhesive strips in place. These skin closures may need to be in place for 2 weeks or longer. If adhesive strip edges start to loosen and curl up, you may trim the loose edges. Do not remove adhesive strips completely unless your health care provider tells you to do that.  Check your incision area every day for signs of infection. Check for: ? More redness, swelling, or pain. ? More fluid or blood. ? Warmth. ? Pus or a bad smell. Medicines  Take over-the-counter and prescription medicines only as told by your health care provider.  Take your antibiotic medicine as told by your health care provider. Do not stop taking the antibiotic even if you start to feel better.  Bathing  Do not take baths, swim, or use a hot tub until your health care provider approves. Ask your health care provider if you can take showers. You may only be allowed to take  sponge baths for bathing.  Keep the dressing dry until your health care provider says it can be removed.  Activity  Limit your movement and activities as told by your health care provider. You may be told to: ? Avoid sleeping on your belly. ? Avoid bending or stretching. ? Avoid raising your arms over your head. ? Avoid lifting anything that is heavier than 5 lb (2.3 kg). ? Avoid doing work around the house, such as loading the dishwasher, vacuuming, or mowing the lawn.  Return to your normal activities as told by your health care provider. Ask your health care provider what activities are safe for you . General instructions  Do not use any tobacco products, such as cigarettes, chewing tobacco, and e-cigarettes. If you need help quitting, ask your health care provider.  Limit alcohol intake to no more than 1 drink per day for nonpregnant women and 2 drinks per day for men. One drink equals 12 oz of beer, 5 oz of wine, or 1 oz of hard liquor.  Do not use recreational drugs.  Keep all follow-up visits as told by your health care provider. This is important.  Contact a health care provider if:  You havemore redness, swelling, or pain around your incision.  You have more fluid or blood coming from your incision.  Your incision feels warm to the touch.  You have pus or a bad smell coming from your incision.  You vomit.  You have a headache for more than 2 days.  You feel confused, anxious, dizzy, or depressed.  You have trouble urinating or having a bowel movement.  Your pain is not being relieved by the pump.  You have a fever.  The alarm on your pump starts to beep. Get help right away if:  You have trouble breathing.  You have a seizure or you pass out.  You have sudden back pain or weakness in your legs.  You lose control over urination or bowel movements (have incontinence). This information is not intended to replace advice given to you by your health care  provider. Make sure you discuss any questions you have with your health care provider. Document Released: 01/13/2009 Document Revised: 03/04/2016 Document Reviewed: 05/19/2015 Elsevier Interactive Patient Education  2018 [employer].

## 2018-01-04 ENCOUNTER — Ambulatory Visit: Payer: Medicare Other

## 2018-01-04 ENCOUNTER — Inpatient Hospital Stay (HOSPITAL_BASED_OUTPATIENT_CLINIC_OR_DEPARTMENT_OTHER): Payer: Medicare Other | Admitting: Hematology and Oncology

## 2018-01-04 ENCOUNTER — Inpatient Hospital Stay: Payer: Medicare Other

## 2018-01-04 DIAGNOSIS — J449 Chronic obstructive pulmonary disease, unspecified: Secondary | ICD-10-CM

## 2018-01-04 DIAGNOSIS — K5909 Other constipation: Secondary | ICD-10-CM

## 2018-01-04 DIAGNOSIS — C8588 Other specified types of non-Hodgkin lymphoma, lymph nodes of multiple sites: Secondary | ICD-10-CM | POA: Diagnosis not present

## 2018-01-04 DIAGNOSIS — R110 Nausea: Secondary | ICD-10-CM | POA: Diagnosis not present

## 2018-01-04 DIAGNOSIS — Z5112 Encounter for antineoplastic immunotherapy: Secondary | ICD-10-CM | POA: Diagnosis not present

## 2018-01-04 DIAGNOSIS — M8450XA Pathological fracture in neoplastic disease, unspecified site, initial encounter for fracture: Secondary | ICD-10-CM

## 2018-01-04 DIAGNOSIS — C8338 Diffuse large B-cell lymphoma, lymph nodes of multiple sites: Secondary | ICD-10-CM | POA: Diagnosis not present

## 2018-01-04 DIAGNOSIS — K5900 Constipation, unspecified: Secondary | ICD-10-CM

## 2018-01-04 DIAGNOSIS — K1231 Oral mucositis (ulcerative) due to antineoplastic therapy: Secondary | ICD-10-CM | POA: Diagnosis not present

## 2018-01-04 DIAGNOSIS — Z5111 Encounter for antineoplastic chemotherapy: Secondary | ICD-10-CM | POA: Diagnosis not present

## 2018-01-04 DIAGNOSIS — G893 Neoplasm related pain (acute) (chronic): Secondary | ICD-10-CM | POA: Diagnosis not present

## 2018-01-04 MED ORDER — PEGFILGRASTIM-CBQV 6 MG/0.6ML ~~LOC~~ SOSY
PREFILLED_SYRINGE | SUBCUTANEOUS | Status: AC
  Filled 2018-01-04: qty 0.6

## 2018-01-04 MED ORDER — PEGFILGRASTIM-CBQV 6 MG/0.6ML ~~LOC~~ SOSY
6.0000 mg | PREFILLED_SYRINGE | Freq: Once | SUBCUTANEOUS | Status: AC
  Administered 2018-01-04: 6 mg via SUBCUTANEOUS

## 2018-01-05 ENCOUNTER — Telehealth: Payer: Self-pay

## 2018-01-05 ENCOUNTER — Encounter: Payer: Self-pay | Admitting: Hematology and Oncology

## 2018-01-05 NOTE — Assessment & Plan Note (Signed)
Overall, she tolerated cycle 2 of chemotherapy with reduced dose treatment better She has fluctuation of blood pressure but overall asymptomatic I recommend close blood pressure monitoring at home She has mild back pain since recent lumbar puncture CSF fluid cytology is pending We will call the patient with test results I will see her back prior to cycle 3 of chemotherapy.

## 2018-01-05 NOTE — Telephone Encounter (Signed)
Called with below message. Verbalized understanding. 

## 2018-01-05 NOTE — Telephone Encounter (Signed)
-----   Message from [name], MD sent at 01/05/2018  2:13 PM EDT ----- Regarding: CSF cytology Let her know that the sample drawn from her recent lumbar puncture from Tuesday was clear ----- Message ----- From: Interface, Lab In [facility] Sent: 01/05/2018   1:26 PM To: [name], MD

## 2018-01-05 NOTE — Assessment & Plan Note (Signed)
We have extensive discussion about laxative therapy due to her chronic constipation.

## 2018-01-05 NOTE — Assessment & Plan Note (Signed)
She has not seen her orthopedic surgeon since surgery I reminded her the importance of getting postop follow-up.

## 2018-01-05 NOTE — Assessment & Plan Note (Signed)
From a recent visit, the patient was noted to be hypoxemic Her oxygen saturation has improved For now, she is not using oxygen therapy I recommend she hang onto her oxygen prescription in case she needs it

## 2018-01-05 NOTE — Progress Notes (Signed)
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  Other constipation We have extensive discussion about laxative therapy due to her chronic constipation.  COPD GOLD II From a recent visit, the patient was noted to be hypoxemic Her oxygen saturation has improved For now, she is not using oxygen therapy I recommend she hang onto her oxygen prescription in case she needs it   Pathological fracture due to neoplastic disease, initial encounter She has not seen her orthopedic surgeon since surgery I reminded her the importance of getting postop follow-up.   Orders Placed This Encounter  Procedures  . DG FLUORO GUIDED NEEDLE PLC ASPIRATION/INJECTION LOC    Standing Status:   Future    Standing Expiration Date:   03/08/2019    Order Specific Question:   Lab orders requested (DO NOT place separate lab orders, these will be automatically ordered during procedure specimen collection):    Answer:   None    Order Specific Question:   Reason for Exam (SYMPTOM  OR DIAGNOSIS REQUIRED)    Answer:   No need CSF fluid. Only IT chemo methotrexate 12 mg in 5 ML    Order Specific Question:   Preferred Imaging Location?    Answer:   [facility]    Order Specific Question:   Radiology Contrast Protocol - do NOT remove file path    Answer:   [email or web address]    INTERVAL HISTORY: Please see below for problem oriented charting. 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 She was treated with chlorambucil in 2000.   --2007 - 09/2008.  Treated with single agent Rituxan.   --12/2008.  She had progressive adenopathy in chest and abdomen and repeat biopsy showed low grade follicular and diffuse B cell NHL.   --01/2009 - 07/2009. She required left ureteral stent and was treated with Treanda/Rituxan x 5 cycles from 02-2009 thru 06-2009.       11/20/2008 Imaging    Mildly displaced and angulated pathologic fracture of the proximal humeral shaft. 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       09/16/2014 Imaging    No lymphadenopathy in the chest, abdomen, or pelvis. The small right axillary lymph nodes seen on the previous exam are unchanged in the 18 month interval. Small to moderate hiatal hernia.      11/22/2017 PET scan  New hypermetabolic lymphadenopathy within the chest and abdomen, consistent with recurrent lymphoma. New large hypermetabolic mass in the right hepatic lobe, consistent with lymphoma this involvement. New small hypermetabolic mass in the left kidney, which may be due to lymphomatous involvement although primary renal cell carcinoma cannot be excluded. Hypermetabolic mass in the right humerus with  pathologic fracture, consistent with lymphomatous involvement. Small hypermetabolic foci in right posterior chest wall, also suspicious for recurrent lymphoma.      11/23/2017 Procedure    Bone, fragment(s), Right Humeral Shaft Reamings - ATYPICAL LYMPHOID INFILTRATE CONSISTENT WITH NON-HODGKIN'S B CELL LYMPHOMA. - SEE ONCOLOGY TABLE. Microscopic Comment LYMPHOMA Histologic type: Non-Hodgkins lymphoma, favor large cell type. Grade (if applicable): Favor high grade. Flow cytometry: No specimen is available for analysis. Immunohistochemical stains: BCL-2, BCL-6, CD3, CD5, CD10, CD15, CD20, CD30, CD34, CD43, LCA, CD79a, CD138, Ki-67, PAX-5 and TdT with appropriate controls as performed on block 1C. Touch preps/imprints: Not performed. Comments: The sections show multiple, primarily soft tissue fragments displaying a dense lymphoid infiltrate characterized by a mixture of small round to slightly irregular lymphocytes, histiocytes, and large centroblastic appearing lymphoid cells with partially clumped to vesicular chromatin, small nucleoli and amphophilic to clear cytoplasm. The number of admixed large lymphoid cells is variable but they appear relatively abundant in some areas associated with clustering. No material is available for flow cytometric analysis. Hence, a large batter of immunohistochemical stains were performed and show a mixture of T and B cells but with a significant B cell component mostly composed of large lymphoid cells with positivity for CD79a, PAX-5, CD30, BCL-2, and BCL-6. LCA shows diffuse staining although some of the large lymphoid cells appear negative. No significant staining is seen with CD20, CD10, CD15, CD34, TdT, or CD138. The lack of CD20 expression in this material is possibly related to prior treatment. There is variable increased expression of Ki-67 ranging from 10 to >50% in some areas particularly where there is abundance of large lymphoid cells. There is admixed  abundant T cell population in the background as seen with CD3, CD5, and CD43 with on apparent co-expression of CD5 in B cell areas. The overall findings are consistent with involvement by non-Hodgkin's B cell lymphoma and the presence of relative abundance of large lymphoid B-cells and increased Ki-67 expression favor a high grade large B cell lymphoma.      11/23/2017 Surgery    1. CPT 24516-Intramedullary nailing of right humerus fracture 2. CPT 20245-Bone biopsy, deep        11/23/2017 - 11/25/2017 Hospital Admission    She was admitted to the hospital for management of humerus fracture and aggressive lymphoma      11/24/2017 Imaging    ECHO showed LV EF: 65% -   70%      11/25/2017 Procedure    Status post ultrasound-guided biopsy of liver mass. Tissue specimen sent to pathology for complete histopathologic analysis      12/07/2017 Imaging    Successful placement of a right internal jugular approach power injectable Port-A-Cath. The catheter is ready for immediate use.      12/09/2017 -  Chemotherapy    The patient had R-CHOP chemo        12/17/2017 - 12/20/2017 Hospital Admission    She was hospitalized for influenza infection      01/03/2018 Procedure    Unsuccessful initial L4-5 LP. Successful L2-3 lumbar puncture with installation of methotrexate.       REVIEW OF SYSTEMS:   Constitutional: Denies fevers,  chills or abnormal weight loss Eyes: Denies blurriness of vision Ears, nose, mouth, throat, and face: Denies mucositis or sore throat Respiratory: Denies cough, dyspnea or wheezes Cardiovascular: Denies palpitation, chest discomfort or lower extremity swelling Gastrointestinal:  Denies nausea, heartburn or change in bowel habits Skin: Denies abnormal skin rashes Lymphatics: Denies new lymphadenopathy or easy bruising Neurological:Denies numbness, tingling or new weaknesses Behavioral/Psych: Mood is stable, no new changes  All other systems were reviewed with the  patient and are negative.  I have reviewed the past medical history, past surgical history, social history and family history with the patient and they are unchanged from previous note.  ALLERGIES:  is allergic to contrast media [iodinated diagnostic agents]; iohexol; povidone-iodine; penicillins; and adhesive [tape].  MEDICATIONS:  Current Outpatient Medications  Medication Sig Dispense Refill  . acetaminophen (TYLENOL) 325 MG tablet Take 2 tablets (650 mg total) by mouth every 6 (six) hours as needed for mild pain (or Fever >/= 101).    Marland Kitchen albuterol (PROVENTIL HFA;VENTOLIN HFA) 108 (90 Base) MCG/ACT inhaler Inhale 2 puffs into the lungs every 6 (six) hours as needed for wheezing or shortness of breath. 1 Inhaler 2  . allopurinol (ZYLOPRIM) 300 MG tablet Take 1 tablet (300 mg total) by mouth daily. 30 tablet 3  . aspirin 81 MG tablet Take 81 mg by mouth daily.    . citalopram (CELEXA) 40 MG tablet Take 1 tablet (40 mg total) by mouth daily. 90 tablet 1  . fluticasone furoate-vilanterol (BREO ELLIPTA) 100-25 MCG/INH AEPB Inhale 1 puff into the lungs every morning.    Marland Kitchen HYDROcodone-acetaminophen (NORCO/VICODIN) 5-325 MG tablet Take 1 tablet by mouth every 4 (four) hours as needed for moderate pain. (Patient taking differently: Take 0.5-1 tablets by mouth every 4 (four) hours as needed for moderate pain. ) 60 tablet 0  . ibuprofen (ADVIL,MOTRIN) 200 MG tablet Take 200 mg by mouth every 6 (six) hours as needed for moderate pain.    . magic mouthwash w/lidocaine SOLN Take 10 mLs by mouth 4 (four) times daily. Swish and swallow 10 ml QID (Patient taking differently: Take 10 mLs by mouth 4 (four) times daily as needed for mouth pain. Swish and swallow 10 ml QID) 240 mL 0  . ondansetron (ZOFRAN) 8 MG tablet Take 1 tablet (8 mg total) by mouth every 8 (eight) hours as needed for nausea. 30 tablet 3  . pantoprazole (PROTONIX) 40 MG tablet Take 1 tablet (40 mg total) by mouth daily. 90 tablet 3  .  predniSONE (DELTASONE) 20 MG tablet Take 2 tablets (40 mg total) by mouth daily. Start 40 mg daily on 2/23. After chemo, take 40 mg for 4 days 60 tablet 6  . prochlorperazine (COMPAZINE) 10 MG tablet Take 1 tablet (10 mg total) by mouth every 6 (six) hours as needed (Nausea or vomiting). 30 tablet 6  . senna (SENOKOT) 8.6 MG TABS tablet Take 2 tablets (17.2 mg total) by mouth daily as needed for mild constipation. 60 each 0   No current facility-administered medications for this visit.     PHYSICAL EXAMINATION: ECOG PERFORMANCE STATUS: 1 - Symptomatic but completely ambulatory  Vitals:   01/04/18 1116  BP: 120/61  Pulse: 87  Resp: 18  Temp: (!) 97.3 F (36.3 C)  SpO2: 95%   Filed Weights   01/04/18 1116  Weight: 125 lb 6.4 oz (56.9 kg)    GENERAL:alert, no distress and comfortable SKIN: skin color, texture, turgor are normal, no rashes or significant lesions EYES:  normal, Conjunctiva are pink and non-injected, sclera clear OROPHARYNX:no exudate, no erythema and lips, buccal mucosa, and tongue normal  NECK: supple, thyroid normal size, non-tender, without nodularity LYMPH:  no palpable lymphadenopathy in the cervical, axillary or inguinal LUNGS: clear to auscultation and percussion with normal breathing effort HEART: regular rate & rhythm and no murmurs and no lower extremity edema ABDOMEN:abdomen soft, non-tender and normal bowel sounds Musculoskeletal:no cyanosis of digits and no clubbing  NEURO: alert & oriented x 3 with fluent speech, no focal motor/sensory deficits  LABORATORY DATA:  I have reviewed the data as listed    Component Value Date/Time   NA 140 12/28/2017 1041   NA 142 12/28/2016 1002   K 3.7 12/28/2017 1041   K 4.6 12/28/2016 1002   CL 103 12/28/2017 1041   CL 101 03/28/2013 0826   CO2 27 12/28/2017 1041   CO2 29 12/28/2016 1002   GLUCOSE 91 12/28/2017 1041   GLUCOSE 85 12/28/2016 1002   GLUCOSE 84 03/28/2013 0826   BUN 8 12/28/2017 1041   BUN 20.3  12/28/2016 1002   CREATININE 0.78 12/28/2017 1041   CREATININE 0.9 12/28/2016 1002   CALCIUM 9.9 12/28/2017 1041   CALCIUM 10.1 12/28/2016 1002   PROT 6.6 12/28/2017 1041   PROT 6.8 12/28/2016 1002   ALBUMIN 3.2 (L) 12/28/2017 1041   ALBUMIN 4.2 12/28/2016 1002   AST 27 12/28/2017 1041   AST 20 12/28/2016 1002   ALT 22 12/28/2017 1041   ALT 12 12/28/2016 1002   ALKPHOS 156 (H) 12/28/2017 1041   ALKPHOS 111 12/28/2016 1002   BILITOT 0.4 12/28/2017 1041   BILITOT 0.55 12/28/2016 1002   GFRNONAA >60 12/28/2017 1041   GFRAA >60 12/28/2017 1041    No results found for: SPEP, UPEP  Lab Results  Component Value Date   WBC 14.8 (H) 12/28/2017   NEUTROABS 12.5 (H) 12/28/2017   HGB 10.1 (L) 12/24/2017   HCT 33.3 (L) 12/28/2017   MCV 101.5 (H) 12/28/2017   PLT 512 (H) 12/28/2017      Chemistry      Component Value Date/Time   NA 140 12/28/2017 1041   NA 142 12/28/2016 1002   K 3.7 12/28/2017 1041   K 4.6 12/28/2016 1002   CL 103 12/28/2017 1041   CL 101 03/28/2013 0826   CO2 27 12/28/2017 1041   CO2 29 12/28/2016 1002   BUN 8 12/28/2017 1041   BUN 20.3 12/28/2016 1002   CREATININE 0.78 12/28/2017 1041   CREATININE 0.9 12/28/2016 1002      Component Value Date/Time   CALCIUM 9.9 12/28/2017 1041   CALCIUM 10.1 12/28/2016 1002   ALKPHOS 156 (H) 12/28/2017 1041   ALKPHOS 111 12/28/2016 1002   AST 27 12/28/2017 1041   AST 20 12/28/2016 1002   ALT 22 12/28/2017 1041   ALT 12 12/28/2016 1002   BILITOT 0.4 12/28/2017 1041   BILITOT 0.55 12/28/2016 1002       RADIOGRAPHIC STUDIES: I have personally reviewed the radiological images as listed and agreed with the findings in the report. Ct Abdomen Pelvis Wo Contrast  Result Date: 12/24/2017 CLINICAL DATA:  Abdominal distension. Patient reports no bowel movement for 7 days. EXAM: CT ABDOMEN AND PELVIS WITHOUT CONTRAST TECHNIQUE: Multidetector CT imaging of the abdomen and pelvis was performed following the standard  protocol without IV contrast. COMPARISON:  Radiograph 12/18/2017.  PET-CT 11/22/2017 FINDINGS: Lower chest: Mild bibasilar scarring. Chronic bilateral lower lobe bronchiectasis. No pleural fluid. No consolidation. Moderate  hiatal hernia. Hepatobiliary: Ill-defined low density in the medial right hepatic lobe at site of hypermetabolic mass on prior PET-CT, not well characterized without contrast, but possibly decreased in size. Gallbladder physiologically distended, no calcified stone. No biliary dilatation. Pancreas: Parenchymal atrophy. No ductal dilatation or inflammation. Spleen: Normal in size.  No focal abnormality on noncontrast exam. Adrenals/Urinary Tract: Unchanged right adrenal thickening. Left adrenal gland is normal. Thinning of bilateral renal parenchyma without hydronephrosis. The hypermetabolic left kidney lesion on prior PET is not well characterized on the current exam without contrast. No perinephric stranding. Urinary bladder is partially distended. No bladder wall thickening. Stomach/Bowel: Large diffuse colonic stool burden with relative transition to decompressed colon involving the mid sigmoid, best appreciated on image 45. There is mild pericolonic edema in this region without well-defined colonic mass. Colonic diverticulosis without diverticulitis. Small amount of pericolonic stranding involves the splenic flexure and descending colon with small amount of fluid in the pericolic gutter. No small bowel dilatation, inflammation or obstruction. Stomach is nondistended, small to moderate hiatal hernia. Vascular/Lymphatic: Decreased porta hepatis adenopathy, not well-defined in the absence of contrast. Right cardiophrenic angle lymph nodes have decreased in size from prior PET. Retroperitoneal adenopathy is diminished with mild persistent periaortic and aortocaval soft tissue density. No evidence of new or progressive intra-abdominal/pelvic adenopathy. Moderate aortic atherosclerosis.  Reproductive: Small uterine calcifications may be fibroids. No adnexal mass. Other: Minimal free fluid in the pericolic gutter. No free air or intra-abdominal abscess. Subcutaneous soft tissue density overlies the left gluteal muscles. Musculoskeletal: Scoliosis and mild degenerative change in the spine. Unchanged sclerotic densities in the right proximal femur and right inferior pubic ramus are not hypermetabolic on prior PET. IMPRESSION: 1. Large colonic stool burden with relative transition to decompressed colon in the mid sigmoid, with mild associated pericolonic stranding. This may reflect focal stricture or colitis. Additional stranding about the splenic flexure and descending colon without definite colonic wall thickening at the sites is nonspecific, but can be seen with stercoral disease. No perforation or free air. No small bowel dilatation. 2. Improved intra-abdominal lymphadenopathy from PET CT last month. Right hepatic lobe lesion is not well-defined without contrast but has likely decreased in size. The hypermetabolic left renal lesion on prior PET is not well-defined in the absence of contrast. No evidence of progressive intra-abdominal/pelvic adenopathy. 3. Patchy subcutaneous density overlies the left gluteal muscles with mild surrounding edema. This may reflect hematoma if there is history of injury, recommend correlation with clinical history. Given history of lymphoma, lymphomatous involvement is not excluded. Recommend attention at follow-up PET imaging. 4.  Aortic Atherosclerosis (ICD10-I70.0). Electronically Signed   By: Jeb Levering M.D.   On: 12/24/2017 23:17   Dg Chest 2 View  Result Date: 12/24/2017 CLINICAL DATA:  Hypoxia. EXAM: CHEST - 2 VIEW COMPARISON:  12/17/2017 FINDINGS: Tip of the right chest port in the mid SVC. Minimal aortic atherosclerosis. The cardiomediastinal contours are normal. The lungs are clear. Pulmonary vasculature is normal. No consolidation, pleural effusion,  or pneumothorax. No acute osseous abnormalities are seen. Intramedullary rod and screw fixation of pathologic mid humerus fracture. IMPRESSION: No acute abnormality or explanation for hypoxia. Electronically Signed   By: Jeb Levering M.D.   On: 12/24/2017 22:41   Dg Chest 2 View  Result Date: 12/17/2017 CLINICAL DATA:  Acute onset of generalized weakness and lethargy. Mild hypoxia. Productive cough and malaise. EXAM: CHEST - 2 VIEW COMPARISON:  Chest radiograph performed 05/17/2017 FINDINGS: The lungs are well-aerated and clear. There is  no evidence of focal opacification, pleural effusion or pneumothorax. The heart is normal in size; the mediastinal contour is within normal limits. No acute osseous abnormalities are seen. Right humeral hardware is incompletely imaged but appears grossly unremarkable, transfixing the right humeral diaphyseal fracture. A right-sided chest port is noted ending about the mid to distal SVC. IMPRESSION: No acute cardiopulmonary process seen. Electronically Signed   By: Garald Balding M.D.   On: 12/17/2017 22:30   Dg Abd 1 View  Result Date: 12/18/2017 CLINICAL DATA:  Acute onset of generalized abdominal fullness. EXAM: ABDOMEN - 1 VIEW COMPARISON:  PET/CT performed 11/22/2017 FINDINGS: The visualized bowel gas pattern is unremarkable. Scattered air and stool filled loops of colon are seen; no abnormal dilatation of small bowel loops is seen to suggest small bowel obstruction. No free intra-abdominal air is identified, though evaluation for free air is limited on a single supine view. The visualized osseous structures are within normal limits; the sacroiliac joints are unremarkable in appearance. The visualized lung bases are essentially clear. IMPRESSION: Unremarkable bowel gas pattern; no free intra-abdominal air seen. Moderate amount of stool noted in the colon. Electronically Signed   By: Garald Balding M.D.   On: 12/18/2017 01:46   Ir US Guide Vasc Access Right  Result  Date: 12/07/2017 INDICATION: History of lymphoma. In need of durable intravenous access for chemotherapy administration. EXAM: IMPLANTED PORT A CATH PLACEMENT WITH ULTRASOUND AND FLUOROSCOPIC GUIDANCE COMPARISON:  PET-CT - 11/22/2016 MEDICATIONS: Clindamycin 600 mg IV; The antibiotic was administered within an appropriate time interval prior to skin puncture. ANESTHESIA/SEDATION: Moderate (conscious) sedation was employed during this procedure. A total of Versed 1 mg and Fentanyl 75 mcg was administered intravenously. Moderate Sedation Time: 30 minutes. The patient's level of consciousness and vital signs were monitored continuously by radiology nursing throughout the procedure under my direct supervision. CONTRAST:  None FLUOROSCOPY TIME:  54 seconds (5 mGy) COMPLICATIONS: None immediate. PROCEDURE: The procedure, risks, benefits, and alternatives were explained to the patient. Questions regarding the procedure were encouraged and answered. The patient understands and consents to the procedure. The right neck and chest were prepped with chlorhexidine in a sterile fashion, and a sterile drape was applied covering the operative field. Maximum barrier sterile technique with sterile gowns and gloves were used for the procedure. A timeout was performed prior to the initiation of the procedure. Local anesthesia was provided with 1% lidocaine with epinephrine. After creating a small venotomy incision, a micropuncture kit was utilized to access the internal jugular vein. Real-time ultrasound guidance was utilized for vascular access including the acquisition of a permanent ultrasound image documenting patency of the accessed vessel. The microwire was utilized to measure appropriate catheter length. A subcutaneous port pocket was then created along the upper chest wall utilizing a combination of sharp and blunt dissection. The pocket was irrigated with sterile saline. A single lumen thin power injectable port was chosen for  placement. The 8 Fr catheter was tunneled from the port pocket site to the venotomy incision. The port was placed in the pocket. The external catheter was trimmed to appropriate length. At the venotomy, an 8 Fr peel-away sheath was placed over a guidewire under fluoroscopic guidance. The catheter was then placed through the sheath and the sheath was removed. Final catheter positioning was confirmed and documented with a fluoroscopic spot radiograph. The port was accessed with a Huber needle, aspirated and flushed with heparinized saline. The venotomy site was closed with an interrupted 4-0 Vicryl suture. The  port pocket incision was closed with interrupted 2-0 Vicryl suture and the skin was opposed with a running subcuticular 4-0 Vicryl suture. Dermabond and Steri-strips were applied to both incisions. Dressings were placed. The patient tolerated the procedure well without immediate post procedural complication. FINDINGS: After catheter placement, the tip lies within the superior cavoatrial junction. The catheter aspirates and flushes normally and is ready for immediate use. IMPRESSION: Successful placement of a right internal jugular approach power injectable Port-A-Cath. The catheter is ready for immediate use. Electronically Signed   By: Sandi Mariscal M.D.   On: 12/07/2017 15:33   Dg Fluoro Guided Needle Plc Aspiration/injection Loc  Result Date: 01/03/2018 CLINICAL DATA:  77 year old female with large-cell lymphoma. Subsequent encounter. EXAM: INTRATHECAL INJECTION OF METHOTREXATE FLUOROSCOPY TIME:  Fluoroscopy Time:  25 seconds. Radiation Exposure Index: 6.64 mGy PROCEDURE: Procedure discussed with Dr. Alvy Bimler. Prior images reviewed. Procedure and associated risks reviewed with the patient. Questions answered and written consent obtained. Time-out performed. Patient prepped with alcohol after marking L4-5 and L2-3 level (allergic to iodine). 1% lidocaine utilized as local anesthetic. Under fluoroscopic  guidance, L4-5 lumbar puncture was performed with a single pass of a 22 gauge spinal needle. Initial clear cerebral spinal fluid with 0.5 cc collected (Tube 1). Flow stopped and with readjustment of needle, minimal blood noted with poor flow. 1% lidocaine utilized at the L2-3 level. L2-3 lumbar puncture performed with single pass 22 gauge spinal needle. Cerebral spinal fluid minimally blood tinged but without adequate flow. 1 cc collected (tube 2) and sent for cytology. 12 mg of methotrexate (prepared by pharmacy) instilled slowly. No immediate complications. IMPRESSION: Unsuccessful initial L4-5 LP. Successful L2-3 lumbar puncture with installation of methotrexate. Electronically Signed   By: Genia Del M.D.   On: 01/03/2018 13:58   Ir Fluoro Guide Port Insertion Right  Result Date: 12/07/2017 INDICATION: History of lymphoma. In need of durable intravenous access for chemotherapy administration. EXAM: IMPLANTED PORT A CATH PLACEMENT WITH ULTRASOUND AND FLUOROSCOPIC GUIDANCE COMPARISON:  PET-CT - 11/22/2016 MEDICATIONS: Clindamycin 600 mg IV; The antibiotic was administered within an appropriate time interval prior to skin puncture. ANESTHESIA/SEDATION: Moderate (conscious) sedation was employed during this procedure. A total of Versed 1 mg and Fentanyl 75 mcg was administered intravenously. Moderate Sedation Time: 30 minutes. The patient's level of consciousness and vital signs were monitored continuously by radiology nursing throughout the procedure under my direct supervision. CONTRAST:  None FLUOROSCOPY TIME:  54 seconds (5 mGy) COMPLICATIONS: None immediate. PROCEDURE: The procedure, risks, benefits, and alternatives were explained to the patient. Questions regarding the procedure were encouraged and answered. The patient understands and consents to the procedure. The right neck and chest were prepped with chlorhexidine in a sterile fashion, and a sterile drape was applied covering the operative field.  Maximum barrier sterile technique with sterile gowns and gloves were used for the procedure. A timeout was performed prior to the initiation of the procedure. Local anesthesia was provided with 1% lidocaine with epinephrine. After creating a small venotomy incision, a micropuncture kit was utilized to access the internal jugular vein. Real-time ultrasound guidance was utilized for vascular access including the acquisition of a permanent ultrasound image documenting patency of the accessed vessel. The microwire was utilized to measure appropriate catheter length. A subcutaneous port pocket was then created along the upper chest wall utilizing a combination of sharp and blunt dissection. The pocket was irrigated with sterile saline. A single lumen thin power injectable port was chosen for placement. The 8 Fr  catheter was tunneled from the port pocket site to the venotomy incision. The port was placed in the pocket. The external catheter was trimmed to appropriate length. At the venotomy, an 8 Fr peel-away sheath was placed over a guidewire under fluoroscopic guidance. The catheter was then placed through the sheath and the sheath was removed. Final catheter positioning was confirmed and documented with a fluoroscopic spot radiograph. The port was accessed with a Huber needle, aspirated and flushed with heparinized saline. The venotomy site was closed with an interrupted 4-0 Vicryl suture. The port pocket incision was closed with interrupted 2-0 Vicryl suture and the skin was opposed with a running subcuticular 4-0 Vicryl suture. Dermabond and Steri-strips were applied to both incisions. Dressings were placed. The patient tolerated the procedure well without immediate post procedural complication. FINDINGS: After catheter placement, the tip lies within the superior cavoatrial junction. The catheter aspirates and flushes normally and is ready for immediate use. IMPRESSION: Successful placement of a right internal  jugular approach power injectable Port-A-Cath. The catheter is ready for immediate use. Electronically Signed   By: Sandi Mariscal M.D.   On: 12/07/2017 15:33    All questions were answered. The patient knows to call the clinic with any problems, questions or concerns. No barriers to learning was detected.  I spent 15 minutes counseling the patient face to face. The total time spent in the appointment was 20 minutes and more than 50% was on counseling and review of test results  [name], MD 01/05/2018 8:03 AM

## 2018-01-06 ENCOUNTER — Telehealth: Payer: Self-pay

## 2018-01-06 ENCOUNTER — Encounter: Payer: Self-pay | Admitting: Hematology and Oncology

## 2018-01-06 NOTE — Telephone Encounter (Signed)
Called husband back per Dr. Alvy Bimler. We did receive the mychart message and the phone message. His wife should take the Golytely this morning, Senokot 2 tabs every 3 hours and Dulcolax this evening. Resume Miralax, Senokot and Dulcolax tomorrow as normal. Reminded him the BM yesterday that was small one but was still a BM. Verbalized understanding.

## 2018-01-06 NOTE — Telephone Encounter (Signed)
Husband called and left message. He wife is taking senna tabs, miralax and dulcolax for constipation. She has not had a bm since Sunday. She has a very small bm yesterday. Any suggestion so they don't end up in the ER.

## 2018-01-07 ENCOUNTER — Other Ambulatory Visit: Payer: Self-pay | Admitting: Adult Health

## 2018-01-07 DIAGNOSIS — F329 Major depressive disorder, single episode, unspecified: Secondary | ICD-10-CM

## 2018-01-07 DIAGNOSIS — Z760 Encounter for issue of repeat prescription: Secondary | ICD-10-CM

## 2018-01-07 DIAGNOSIS — F32A Depression, unspecified: Secondary | ICD-10-CM

## 2018-01-09 ENCOUNTER — Encounter: Payer: Self-pay | Admitting: Hematology and Oncology

## 2018-01-10 NOTE — Telephone Encounter (Signed)
Sent to the pharmacy by e-scribe. 

## 2018-01-11 ENCOUNTER — Encounter: Payer: Self-pay | Admitting: Adult Health

## 2018-01-11 ENCOUNTER — Ambulatory Visit: Payer: Medicare Other | Admitting: Adult Health

## 2018-01-11 ENCOUNTER — Other Ambulatory Visit: Payer: Self-pay | Admitting: Adult Health

## 2018-01-11 DIAGNOSIS — K2100 Gastro-esophageal reflux disease with esophagitis, without bleeding: Secondary | ICD-10-CM

## 2018-01-11 MED ORDER — PANTOPRAZOLE SODIUM 40 MG PO TBEC: 40.0000 mg | DELAYED_RELEASE_TABLET | Freq: Two times a day (BID) | ORAL | 3 refills | Status: DC

## 2018-01-12 ENCOUNTER — Encounter: Payer: Self-pay | Admitting: Hematology and Oncology

## 2018-01-16 ENCOUNTER — Telehealth: Payer: Self-pay | Admitting: *Deleted

## 2018-01-16 NOTE — Telephone Encounter (Signed)
Follow up Friday's email and call. Was instructed to use pantoprazole and tums as needed  Pt states the burning has gone away.

## 2018-01-16 NOTE — Telephone Encounter (Signed)
-----   Message from [name], MD sent at 01/16/2018  8:01 AM EDT ----- Regarding: call her for update Can you call and see how she is doing?

## 2018-01-20 ENCOUNTER — Telehealth: Payer: Self-pay | Admitting: Hematology and Oncology

## 2018-01-20 NOTE — Telephone Encounter (Signed)
Spoke to patient regarding upcoming April appointments per 4/11 sch message

## 2018-01-23 ENCOUNTER — Inpatient Hospital Stay: Payer: Medicare Other | Attending: Hematology and Oncology

## 2018-01-23 ENCOUNTER — Telehealth: Payer: Self-pay | Admitting: Hematology and Oncology

## 2018-01-23 ENCOUNTER — Encounter: Payer: Self-pay | Admitting: Hematology and Oncology

## 2018-01-23 ENCOUNTER — Inpatient Hospital Stay (HOSPITAL_BASED_OUTPATIENT_CLINIC_OR_DEPARTMENT_OTHER): Payer: Medicare Other | Admitting: Hematology and Oncology

## 2018-01-23 ENCOUNTER — Inpatient Hospital Stay: Payer: Medicare Other

## 2018-01-23 VITALS — BP 114/66 | HR 96 | Temp 97.4°F | Resp 18 | Ht 64.0 in | Wt 121.7 lb

## 2018-01-23 VITALS — BP 98/54 | HR 84 | Temp 97.8°F | Resp 16

## 2018-01-23 DIAGNOSIS — Z5112 Encounter for antineoplastic immunotherapy: Secondary | ICD-10-CM | POA: Insufficient documentation

## 2018-01-23 DIAGNOSIS — T451X5A Adverse effect of antineoplastic and immunosuppressive drugs, initial encounter: Secondary | ICD-10-CM

## 2018-01-23 DIAGNOSIS — C8338 Diffuse large B-cell lymphoma, lymph nodes of multiple sites: Secondary | ICD-10-CM | POA: Diagnosis not present

## 2018-01-23 DIAGNOSIS — D6481 Anemia due to antineoplastic chemotherapy: Secondary | ICD-10-CM | POA: Diagnosis not present

## 2018-01-23 DIAGNOSIS — Z5189 Encounter for other specified aftercare: Secondary | ICD-10-CM | POA: Diagnosis not present

## 2018-01-23 DIAGNOSIS — Z5111 Encounter for antineoplastic chemotherapy: Secondary | ICD-10-CM | POA: Diagnosis not present

## 2018-01-23 DIAGNOSIS — K5900 Constipation, unspecified: Secondary | ICD-10-CM | POA: Insufficient documentation

## 2018-01-23 DIAGNOSIS — C8588 Other specified types of non-Hodgkin lymphoma, lymph nodes of multiple sites: Secondary | ICD-10-CM

## 2018-01-23 DIAGNOSIS — K5909 Other constipation: Secondary | ICD-10-CM

## 2018-01-23 DIAGNOSIS — M8450XA Pathological fracture in neoplastic disease, unspecified site, initial encounter for fracture: Secondary | ICD-10-CM

## 2018-01-23 DIAGNOSIS — D61818 Other pancytopenia: Secondary | ICD-10-CM

## 2018-01-23 DIAGNOSIS — R12 Heartburn: Secondary | ICD-10-CM

## 2018-01-23 LAB — CMP (CANCER CENTER ONLY)
ALT: 13 U/L (ref 0–55)
AST: 15 U/L (ref 5–34)
Albumin: 3.3 g/dL — ABNORMAL LOW (ref 3.5–5.0)
Alkaline Phosphatase: 136 U/L (ref 40–150)
Anion gap: 10 (ref 3–11)
BUN: 11 mg/dL (ref 7–26)
CHLORIDE: 106 mmol/L (ref 98–109)
CO2: 27 mmol/L (ref 22–29)
CREATININE: 0.77 mg/dL (ref 0.60–1.10)
Calcium: 9.7 mg/dL (ref 8.4–10.4)
GFR, Estimated: >60 mL/min (ref >60)
GLUCOSE: 119 mg/dL (ref 70–140)
Potassium: 3.5 mmol/L (ref 3.5–5.1)
SODIUM: 143 mmol/L (ref 136–145)
Total Bilirubin: 0.3 mg/dL (ref 0.2–1.2)
Total Protein: 6 g/dL — ABNORMAL LOW (ref 6.4–8.3)

## 2018-01-23 LAB — CBC WITH DIFFERENTIAL (CANCER CENTER ONLY)
BASOS ABS: 0.1 10*3/uL (ref 0.0–0.1)
Basophils Relative: 1 %
EOS ABS: 0.2 10*3/uL (ref 0.0–0.5)
EOS PCT: 2 %
HCT: 32.9 % — ABNORMAL LOW (ref 34.8–46.6)
Hemoglobin: 10.4 g/dL — ABNORMAL LOW (ref 11.6–15.9)
Lymphocytes Relative: 13 %
Lymphs Abs: 1.1 10*3/uL (ref 0.9–3.3)
MCH: 31.8 pg (ref 25.1–34.0)
MCHC: 31.6 g/dL (ref 31.5–36.0)
MCV: 100.6 fL (ref 79.5–101.0)
Monocytes Absolute: 0.5 10*3/uL (ref 0.1–0.9)
Monocytes Relative: 5 %
NEUTROS PCT: 79 %
Neutro Abs: 6.5 10*3/uL (ref 1.5–6.5)
PLATELETS: 404 10*3/uL — AB (ref 145–400)
RBC: 3.27 MIL/uL — AB (ref 3.70–5.45)
RDW: 17.2 % — ABNORMAL HIGH (ref 11.2–14.5)
WBC: 8.3 10*3/uL (ref 3.9–10.3)

## 2018-01-23 LAB — URIC ACID: URIC ACID, SERUM: 6.7 mg/dL (ref 2.6–7.4)

## 2018-01-23 LAB — SAMPLE TO BLOOD BANK

## 2018-01-23 MED ORDER — RITUXIMAB CHEMO INJECTION 500 MG/50ML
600.0000 mg | Freq: Once | INTRAVENOUS | Status: AC
  Administered 2018-01-23: 600 mg via INTRAVENOUS
  Filled 2018-01-23: qty 50

## 2018-01-23 MED ORDER — PALONOSETRON HCL INJECTION 0.25 MG/5ML
0.2500 mg | Freq: Once | INTRAVENOUS | Status: AC
  Administered 2018-01-23: 0.25 mg via INTRAVENOUS

## 2018-01-23 MED ORDER — SODIUM CHLORIDE 0.9 % IV SOLN
Freq: Once | INTRAVENOUS | Status: AC
  Administered 2018-01-23: 11:00:00 via INTRAVENOUS

## 2018-01-23 MED ORDER — PALONOSETRON HCL INJECTION 0.25 MG/5ML
INTRAVENOUS | Status: AC
  Filled 2018-01-23: qty 5

## 2018-01-23 MED ORDER — ACETAMINOPHEN 325 MG PO TABS
650.0000 mg | ORAL_TABLET | Freq: Once | ORAL | Status: AC
  Administered 2018-01-23: 650 mg via ORAL

## 2018-01-23 MED ORDER — DIPHENHYDRAMINE HCL 25 MG PO CAPS
50.0000 mg | ORAL_CAPSULE | Freq: Once | ORAL | Status: AC
  Administered 2018-01-23: 50 mg via ORAL

## 2018-01-23 MED ORDER — HEPARIN SOD (PORK) LOCK FLUSH 100 UNIT/ML IV SOLN
500.0000 [IU] | Freq: Once | INTRAVENOUS | Status: AC | PRN
  Administered 2018-01-23: 500 [IU]
  Filled 2018-01-23: qty 5

## 2018-01-23 MED ORDER — ACETAMINOPHEN 325 MG PO TABS
ORAL_TABLET | ORAL | Status: AC
  Filled 2018-01-23: qty 2

## 2018-01-23 MED ORDER — FOSAPREPITANT DIMEGLUMINE INJECTION 150 MG
Freq: Once | INTRAVENOUS | Status: AC
  Administered 2018-01-23: 11:00:00 via INTRAVENOUS
  Filled 2018-01-23: qty 5

## 2018-01-23 MED ORDER — SODIUM CHLORIDE 0.9% FLUSH
10.0000 mL | INTRAVENOUS | Status: DC | PRN
  Administered 2018-01-23: 10 mL
  Filled 2018-01-23: qty 10

## 2018-01-23 MED ORDER — CYCLOPHOSPHAMIDE CHEMO INJECTION 1 GM
375.0000 mg/m2 | Freq: Once | INTRAMUSCULAR | Status: AC
  Administered 2018-01-23: 640 mg via INTRAVENOUS
  Filled 2018-01-23: qty 32

## 2018-01-23 MED ORDER — DOXORUBICIN HCL CHEMO IV INJECTION 2 MG/ML
25.0000 mg/m2 | Freq: Once | INTRAVENOUS | Status: AC
  Administered 2018-01-23: 42 mg via INTRAVENOUS
  Filled 2018-01-23: qty 21

## 2018-01-23 MED ORDER — DIPHENHYDRAMINE HCL 25 MG PO CAPS
ORAL_CAPSULE | ORAL | Status: AC
  Filled 2018-01-23: qty 2

## 2018-01-23 MED ORDER — VINCRISTINE SULFATE CHEMO INJECTION 1 MG/ML
1.0000 mg | Freq: Once | INTRAVENOUS | Status: AC
  Administered 2018-01-23: 1 mg via INTRAVENOUS
  Filled 2018-01-23: qty 1

## 2018-01-23 NOTE — Progress Notes (Signed)
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 The patient denies recent history of bleeding such as epistaxis, hematuria or hematochezia. She is asymptomatic from the anemia. I will observe for now.  She does not require transfusion now. I will continue the chemotherapy at current dose without dosage adjustment.  If the anemia gets progressive worse in the future, I might have to delay her treatment or adjust the chemotherapy dose.   Other constipation We have extensive discussion about laxative therapy due to her chronic constipation.  Pathological fracture due to neoplastic disease, initial encounter She has not seen her surgeon since surgery I reinforced again the importance of keeping her appointment For now, she is on nonweightbearing exercise of the right shoulder   Orders Placed This Encounter  Procedures  . NM PET Image Restag (PS) Skull Base To Thigh    Standing Status:   Future    Standing Expiration Date:   04/25/2019    Order Specific Question:   If indicated for the ordered procedure, I authorize the administration of a radiopharmaceutical per Radiology protocol    Answer:   Yes    Order Specific Question:   Preferred imaging location?    Answer:   [facility]    Order Specific Question:   Radiology Contrast Protocol - do  NOT remove file path    Answer:   [email or web address]  . ECHOCARDIOGRAM COMPLETE    Standing Status:   Future    Standing Expiration Date:   04/25/2019    Order Specific Question:   Where should this test be performed    Answer:   [facility]    Order Specific Question:   Perflutren DEFINITY (image enhancing agent) should be administered unless hypersensitivity or allergy exist    Answer:   Administer Perflutren    Order Specific Question:   Expected Date:    Answer:   Other - See Comments    INTERVAL HISTORY: Please see below for problem oriented charting. She returns with her husband to be seen prior to cycle 3 of chemotherapy She denies mucositis, nausea or vomiting or significant peripheral neuropathy She has not seen surgeon since surgery She is taking regular laxatives to regulate bowel movement Her appetite is stable but she has not gained any weight She denies cancer pain.  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 She was treated with chlorambucil in 2000.   --2007 - 09/2008.  Treated with single agent Rituxan.   --12/2008.  She had progressive adenopathy in chest and abdomen and repeat biopsy showed low  grade follicular and diffuse B cell NHL.   --01/2009 - 07/2009. She required left ureteral stent and was treated with Treanda/Rituxan x 5 cycles from 02-2009 thru 06-2009.       11/20/2008 Imaging    Mildly displaced and angulated pathologic fracture of the proximal humeral shaft. 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       09/16/2014 Imaging    No lymphadenopathy in the chest,  abdomen, or pelvis. The small right axillary lymph nodes seen on the previous exam are unchanged in the 18 month interval. Small to moderate hiatal hernia.      11/22/2017 PET scan    New hypermetabolic lymphadenopathy within the chest and abdomen, consistent with recurrent lymphoma. New large hypermetabolic mass in the right hepatic lobe, consistent with lymphoma this involvement. New small hypermetabolic mass in the left kidney, which may be due to lymphomatous involvement although primary renal cell carcinoma cannot be excluded. Hypermetabolic mass in the right humerus with pathologic fracture, consistent with lymphomatous involvement. Small hypermetabolic foci in right posterior chest wall, also suspicious for recurrent lymphoma.      11/23/2017 Procedure    Bone, fragment(s), Right Humeral Shaft Reamings - ATYPICAL LYMPHOID INFILTRATE CONSISTENT WITH NON-HODGKIN'S B CELL LYMPHOMA. - SEE ONCOLOGY TABLE. Microscopic Comment LYMPHOMA Histologic type: Non-Hodgkins lymphoma, favor large cell type. Grade (if applicable): Favor high grade. Flow cytometry: No specimen is available for analysis. Immunohistochemical stains: BCL-2, BCL-6, CD3, CD5, CD10, CD15, CD20, CD30, CD34, CD43, LCA, CD79a, CD138, Ki-67, PAX-5 and TdT with appropriate controls as performed on block 1C. Touch preps/imprints: Not performed. Comments: The sections show multiple, primarily soft tissue fragments displaying a dense lymphoid infiltrate characterized by a mixture of small round to slightly irregular lymphocytes, histiocytes, and large centroblastic appearing lymphoid cells with partially clumped to vesicular chromatin, small nucleoli and amphophilic to clear cytoplasm. The number of admixed large lymphoid cells is variable but they appear relatively abundant in some areas associated with clustering. No material is available for flow cytometric analysis. Hence, a large batter of immunohistochemical stains were performed  and show a mixture of T and B cells but with a significant B cell component mostly composed of large lymphoid cells with positivity for CD79a, PAX-5, CD30, BCL-2, and BCL-6. LCA shows diffuse staining although some of the large lymphoid cells appear negative. No significant staining is seen with CD20, CD10, CD15, CD34, TdT, or CD138. The lack of CD20 expression in this material is possibly related to prior treatment. There is variable increased expression of Ki-67 ranging from 10 to >50% in some areas particularly where there is abundance of large lymphoid cells. There is admixed abundant T cell population in the background as seen with CD3, CD5, and CD43 with on apparent co-expression of CD5 in B cell areas. The overall findings are consistent with involvement by non-Hodgkin's B cell lymphoma and the presence of relative abundance of large lymphoid B-cells and increased Ki-67 expression favor a high grade large B cell lymphoma.      11/23/2017 Surgery    1. CPT 24516-Intramedullary nailing of right humerus fracture 2. CPT 20245-Bone biopsy, deep        11/23/2017 - 11/25/2017 Hospital Admission    She was admitted to the hospital for management of humerus fracture and aggressive lymphoma      11/24/2017 Imaging    ECHO showed LV EF: 65% -   70%      11/25/2017 Procedure    Status post ultrasound-guided biopsy  of liver mass. Tissue specimen sent to pathology for complete histopathologic analysis      12/07/2017 Imaging    Successful placement of a right internal jugular approach power injectable Port-A-Cath. The catheter is ready for immediate use.      12/09/2017 -  Chemotherapy    The patient had R-CHOP chemo        12/17/2017 - 12/20/2017 Hospital Admission    She was hospitalized for influenza infection      01/03/2018 Procedure    Unsuccessful initial L4-5 LP. Successful L2-3 lumbar puncture with installation of methotrexate.       REVIEW OF SYSTEMS:   Constitutional: Denies  fevers, chills or abnormal weight loss Eyes: Denies blurriness of vision Ears, nose, mouth, throat, and face: Denies mucositis or sore throat Respiratory: Denies cough, dyspnea or wheezes Cardiovascular: Denies palpitation, chest discomfort or lower extremity swelling Skin: Denies abnormal skin rashes Lymphatics: Denies new lymphadenopathy or easy bruising Neurological:Denies numbness, tingling or new weaknesses Behavioral/Psych: Mood is stable, no new changes  All other systems were reviewed with the patient and are negative.  I have reviewed the past medical history, past surgical history, social history and family history with the patient and they are unchanged from previous note.  ALLERGIES:  is allergic to contrast media [iodinated diagnostic agents]; iohexol; povidone-iodine; penicillins; and adhesive [tape].  MEDICATIONS:  Current Outpatient Medications  Medication Sig Dispense Refill  . acetaminophen (TYLENOL) 325 MG tablet Take 2 tablets (650 mg total) by mouth every 6 (six) hours as needed for mild pain (or Fever >/= 101).    Marland Kitchen albuterol (PROVENTIL HFA;VENTOLIN HFA) 108 (90 Base) MCG/ACT inhaler Inhale 2 puffs into the lungs every 6 (six) hours as needed for wheezing or shortness of breath. 1 Inhaler 2  . aspirin 81 MG tablet Take 81 mg by mouth daily.    . citalopram (CELEXA) 40 MG tablet TAKE 1 TABLET BY MOUTH DAILY 90 tablet 1  . fluticasone furoate-vilanterol (BREO ELLIPTA) 100-25 MCG/INH AEPB Inhale 1 puff into the lungs every morning.    Marland Kitchen HYDROcodone-acetaminophen (NORCO/VICODIN) 5-325 MG tablet Take 1 tablet by mouth every 4 (four) hours as needed for moderate pain. (Patient taking differently: Take 0.5-1 tablets by mouth every 4 (four) hours as needed for moderate pain. ) 60 tablet 0  . ibuprofen (ADVIL,MOTRIN) 200 MG tablet Take 200 mg by mouth every 6 (six) hours as needed for moderate pain.    . magic mouthwash w/lidocaine SOLN Take 10 mLs by mouth 4 (four) times daily.  Swish and swallow 10 ml QID (Patient taking differently: Take 10 mLs by mouth 4 (four) times daily as needed for mouth pain. Swish and swallow 10 ml QID) 240 mL 0  . ondansetron (ZOFRAN) 8 MG tablet Take 1 tablet (8 mg total) by mouth every 8 (eight) hours as needed for nausea. 30 tablet 3  . pantoprazole (PROTONIX) 40 MG tablet Take 1 tablet (40 mg total) by mouth 2 (two) times daily. 180 tablet 3  . predniSONE (DELTASONE) 20 MG tablet Take 2 tablets (40 mg total) by mouth daily. Start 40 mg daily on 2/23. After chemo, take 40 mg for 4 days 60 tablet 6  . prochlorperazine (COMPAZINE) 10 MG tablet Take 1 tablet (10 mg total) by mouth every 6 (six) hours as needed (Nausea or vomiting). 30 tablet 6  . senna (SENOKOT) 8.6 MG TABS tablet Take 2 tablets (17.2 mg total) by mouth daily as needed for mild constipation. 60 each 0  No current facility-administered medications for this visit.    Facility-Administered Medications Ordered in Other Visits  Medication Dose Route Frequency Provider Last Rate Last Dose  . acetaminophen (TYLENOL) tablet 650 mg  650 mg Oral Once Alvy Bimler, Simeon Vera, MD      . cyclophosphamide (CYTOXAN) 640 mg in sodium chloride 0.9 % 250 mL chemo infusion  375 mg/m2 (Treatment Plan Recorded) Intravenous Once Alvy Bimler, Chaslyn Eisen, MD      . diphenhydrAMINE (BENADRYL) capsule 50 mg  50 mg Oral Once Alvy Bimler, Rheanna Sergent, MD      . DOXOrubicin (ADRIAMYCIN) chemo injection 42 mg  25 mg/m2 (Treatment Plan Recorded) Intravenous Once Alvy Bimler, Kansas Spainhower, MD      . heparin lock flush 100 unit/mL  500 Units Intracatheter Once PRN Alvy Bimler, Jamika Sadek, MD      . riTUXimab (RITUXAN) 600 mg in sodium chloride 0.9 % 250 mL (1.9355 mg/mL) infusion  600 mg Intravenous Once Random Dobrowski, MD      . sodium chloride flush (NS) 0.9 % injection 10 mL  10 mL Intracatheter PRN Deandria Klute, MD      . vinCRIStine (ONCOVIN) 1 mg in sodium chloride 0.9 % 50 mL chemo infusion  1 mg Intravenous Once Alvy Bimler, Shivansh Hardaway, MD        PHYSICAL EXAMINATION: ECOG  PERFORMANCE STATUS: 1 - Symptomatic but completely ambulatory  Vitals:   01/23/18 1004  BP: 114/66  Pulse: 96  Resp: 18  Temp: (!) 97.4 F (36.3 C)  SpO2: 94%   Filed Weights   01/23/18 1004  Weight: 121 lb 11.2 oz (55.2 kg)    GENERAL:alert, no distress and comfortable SKIN: skin color, texture, turgor are normal, no rashes or significant lesions EYES: normal, Conjunctiva are pink and non-injected, sclera clear OROPHARYNX:no exudate, no erythema and lips, buccal mucosa, and tongue normal  NECK: supple, thyroid normal size, non-tender, without nodularity LYMPH:  no palpable lymphadenopathy in the cervical, axillary or inguinal LUNGS: clear to auscultation and percussion with normal breathing effort HEART: regular rate & rhythm and no murmurs and no lower extremity edema ABDOMEN:abdomen soft, non-tender and normal bowel sounds Musculoskeletal:no cyanosis of digits and no clubbing  NEURO: alert & oriented x 3 with fluent speech, no focal motor/sensory deficits  LABORATORY DATA:  I have reviewed the data as listed    Component Value Date/Time   NA 143 01/23/2018 0923   NA 142 12/28/2016 1002   K 3.5 01/23/2018 0923   K 4.6 12/28/2016 1002   CL 106 01/23/2018 0923   CL 101 03/28/2013 0826   CO2 27 01/23/2018 0923   CO2 29 12/28/2016 1002   GLUCOSE 119 01/23/2018 0923   GLUCOSE 85 12/28/2016 1002   GLUCOSE 84 03/28/2013 0826   BUN 11 01/23/2018 0923   BUN 20.3 12/28/2016 1002   CREATININE 0.77 01/23/2018 0923   CREATININE 0.9 12/28/2016 1002   CALCIUM 9.7 01/23/2018 0923   CALCIUM 10.1 12/28/2016 1002   PROT 6.0 (L) 01/23/2018 0923   PROT 6.8 12/28/2016 1002   ALBUMIN 3.3 (L) 01/23/2018 0923   ALBUMIN 4.2 12/28/2016 1002   AST 15 01/23/2018 0923   AST 20 12/28/2016 1002   ALT 13 01/23/2018 0923   ALT 12 12/28/2016 1002   ALKPHOS 136 01/23/2018 0923   ALKPHOS 111 12/28/2016 1002   BILITOT 0.3 01/23/2018 0923   BILITOT 0.55 12/28/2016 1002   GFRNONAA >60  01/23/2018 0923   GFRAA >60 01/23/2018 0923    No results found for: SPEP, UPEP  Lab Results  Component Value Date   WBC 8.3 01/23/2018   NEUTROABS 6.5 01/23/2018   HGB 10.1 (L) 12/24/2017   HCT 32.9 (L) 01/23/2018   MCV 100.6 01/23/2018   PLT 404 (H) 01/23/2018      Chemistry      Component Value Date/Time   NA 143 01/23/2018 0923   NA 142 12/28/2016 1002   K 3.5 01/23/2018 0923   K 4.6 12/28/2016 1002   CL 106 01/23/2018 0923   CL 101 03/28/2013 0826   CO2 27 01/23/2018 0923   CO2 29 12/28/2016 1002   BUN 11 01/23/2018 0923   BUN 20.3 12/28/2016 1002   CREATININE 0.77 01/23/2018 0923   CREATININE 0.9 12/28/2016 1002      Component Value Date/Time   CALCIUM 9.7 01/23/2018 0923   CALCIUM 10.1 12/28/2016 1002   ALKPHOS 136 01/23/2018 0923   ALKPHOS 111 12/28/2016 1002   AST 15 01/23/2018 0923   AST 20 12/28/2016 1002   ALT 13 01/23/2018 0923   ALT 12 12/28/2016 1002   BILITOT 0.3 01/23/2018 0923   BILITOT 0.55 12/28/2016 1002       RADIOGRAPHIC STUDIES: I have personally reviewed the radiological images as listed and agreed with the findings in the report. Ct Abdomen Pelvis Wo Contrast  Result Date: 12/24/2017 CLINICAL DATA:  Abdominal distension. Patient reports no bowel movement for 7 days. EXAM: CT ABDOMEN AND PELVIS WITHOUT CONTRAST TECHNIQUE: Multidetector CT imaging of the abdomen and pelvis was performed following the standard protocol without IV contrast. COMPARISON:  Radiograph 12/18/2017.  PET-CT 11/22/2017 FINDINGS: Lower chest: Mild bibasilar scarring. Chronic bilateral lower lobe bronchiectasis. No pleural fluid. No consolidation. Moderate hiatal hernia. Hepatobiliary: Ill-defined low density in the medial right hepatic lobe at site of hypermetabolic mass on prior PET-CT, not well characterized without contrast, but possibly decreased in size. Gallbladder physiologically distended, no calcified stone. No biliary dilatation. Pancreas: Parenchymal atrophy.  No ductal dilatation or inflammation. Spleen: Normal in size.  No focal abnormality on noncontrast exam. Adrenals/Urinary Tract: Unchanged right adrenal thickening. Left adrenal gland is normal. Thinning of bilateral renal parenchyma without hydronephrosis. The hypermetabolic left kidney lesion on prior PET is not well characterized on the current exam without contrast. No perinephric stranding. Urinary bladder is partially distended. No bladder wall thickening. Stomach/Bowel: Large diffuse colonic stool burden with relative transition to decompressed colon involving the mid sigmoid, best appreciated on image 45. There is mild pericolonic edema in this region without well-defined colonic mass. Colonic diverticulosis without diverticulitis. Small amount of pericolonic stranding involves the splenic flexure and descending colon with small amount of fluid in the pericolic gutter. No small bowel dilatation, inflammation or obstruction. Stomach is nondistended, small to moderate hiatal hernia. Vascular/Lymphatic: Decreased porta hepatis adenopathy, not well-defined in the absence of contrast. Right cardiophrenic angle lymph nodes have decreased in size from prior PET. Retroperitoneal adenopathy is diminished with mild persistent periaortic and aortocaval soft tissue density. No evidence of new or progressive intra-abdominal/pelvic adenopathy. Moderate aortic atherosclerosis. Reproductive: Small uterine calcifications may be fibroids. No adnexal mass. Other: Minimal free fluid in the pericolic gutter. No free air or intra-abdominal abscess. Subcutaneous soft tissue density overlies the left gluteal muscles. Musculoskeletal: Scoliosis and mild degenerative change in the spine. Unchanged sclerotic densities in the right proximal femur and right inferior pubic ramus are not hypermetabolic on prior PET. IMPRESSION: 1. Large colonic stool burden with relative transition to decompressed colon in the mid sigmoid, with mild  associated pericolonic stranding. This may reflect  focal stricture or colitis. Additional stranding about the splenic flexure and descending colon without definite colonic wall thickening at the sites is nonspecific, but can be seen with stercoral disease. No perforation or free air. No small bowel dilatation. 2. Improved intra-abdominal lymphadenopathy from PET CT last month. Right hepatic lobe lesion is not well-defined without contrast but has likely decreased in size. The hypermetabolic left renal lesion on prior PET is not well-defined in the absence of contrast. No evidence of progressive intra-abdominal/pelvic adenopathy. 3. Patchy subcutaneous density overlies the left gluteal muscles with mild surrounding edema. This may reflect hematoma if there is history of injury, recommend correlation with clinical history. Given history of lymphoma, lymphomatous involvement is not excluded. Recommend attention at follow-up PET imaging. 4.  Aortic Atherosclerosis (ICD10-I70.0). Electronically Signed   By: Jeb Levering M.D.   On: 12/24/2017 23:17   Dg Chest 2 View  Result Date: 12/24/2017 CLINICAL DATA:  Hypoxia. EXAM: CHEST - 2 VIEW COMPARISON:  12/17/2017 FINDINGS: Tip of the right chest port in the mid SVC. Minimal aortic atherosclerosis. The cardiomediastinal contours are normal. The lungs are clear. Pulmonary vasculature is normal. No consolidation, pleural effusion, or pneumothorax. No acute osseous abnormalities are seen. Intramedullary rod and screw fixation of pathologic mid humerus fracture. IMPRESSION: No acute abnormality or explanation for hypoxia. Electronically Signed   By: Jeb Levering M.D.   On: 12/24/2017 22:41   Dg Fluoro Guided Needle Plc Aspiration/injection Loc  Result Date: 01/03/2018 CLINICAL DATA:  77 year old female with large-cell lymphoma. Subsequent encounter. EXAM: INTRATHECAL INJECTION OF METHOTREXATE FLUOROSCOPY TIME:  Fluoroscopy Time:  25 seconds. Radiation Exposure  Index: 6.64 mGy PROCEDURE: Procedure discussed with Dr. Alvy Bimler. Prior images reviewed. Procedure and associated risks reviewed with the patient. Questions answered and written consent obtained. Time-out performed. Patient prepped with alcohol after marking L4-5 and L2-3 level (allergic to iodine). 1% lidocaine utilized as local anesthetic. Under fluoroscopic guidance, L4-5 lumbar puncture was performed with a single pass of a 22 gauge spinal needle. Initial clear cerebral spinal fluid with 0.5 cc collected (Tube 1). Flow stopped and with readjustment of needle, minimal blood noted with poor flow. 1% lidocaine utilized at the L2-3 level. L2-3 lumbar puncture performed with single pass 22 gauge spinal needle. Cerebral spinal fluid minimally blood tinged but without adequate flow. 1 cc collected (tube 2) and sent for cytology. 12 mg of methotrexate (prepared by pharmacy) instilled slowly. No immediate complications. IMPRESSION: Unsuccessful initial L4-5 LP. Successful L2-3 lumbar puncture with installation of methotrexate. Electronically Signed   By: Genia Del M.D.   On: 01/03/2018 13:58    All questions were answered. The patient knows to call the clinic with any problems, questions or concerns. No barriers to learning was detected.  I spent 25 minutes counseling the patient face to face. The total time spent in the appointment was 30 minutes and more than 50% was on counseling and review of test results  Brooke Lark, MD 01/23/2018 11:39 AM

## 2018-01-23 NOTE — Assessment & Plan Note (Signed)
She has not seen her surgeon since surgery I reinforced again the importance of keeping her appointment For now, she is on nonweightbearing exercise of the right shoulder

## 2018-01-23 NOTE — Assessment & Plan Note (Signed)
So far, she tolerated reduced dose chemotherapy well We will proceed with a similar dose adjustment I plan to repeat echocardiogram and PET/CT scan around Feb 10, 2018 prior to cycle 4 of treatment She will receive 4 doses of intrathecal chemotherapy as scheduled

## 2018-01-23 NOTE — Assessment & Plan Note (Signed)
We have extensive discussion about laxative therapy due to her chronic constipation.

## 2018-01-23 NOTE — Patient Instructions (Signed)
[facility] Discharge Instructions for Patients Receiving Chemotherapy  Today you received the following chemotherapy agents: Adriamycin, Vincristin, Cytoxan and Rituxan. To help prevent nausea and vomiting after your treatment, we encourage you to take your nausea medication: Compazine. Take one every 6 hours as needed. For nausea on or after 4/18, take Zofran every 8 hours as needed.   If you develop nausea and vomiting that is not controlled by your nausea medication, call the clinic.   BELOW ARE SYMPTOMS THAT SHOULD BE REPORTED IMMEDIATELY:  *FEVER GREATER THAN 100.5 F  *CHILLS WITH OR WITHOUT FEVER  NAUSEA AND VOMITING THAT IS NOT CONTROLLED WITH YOUR NAUSEA MEDICATION  *UNUSUAL SHORTNESS OF BREATH  *UNUSUAL BRUISING OR BLEEDING  TENDERNESS IN MOUTH AND THROAT WITH OR WITHOUT PRESENCE OF ULCERS  *URINARY PROBLEMS  *BOWEL PROBLEMS  UNUSUAL RASH Items with * indicate a potential emergency and should be followed up as soon as possible.  Feel free to call the clinic should you have any questions or concerns. The clinic phone number is [phone].  Please show the [facility] at check-in to the Emergency Department and triage nurse.

## 2018-01-23 NOTE — Telephone Encounter (Signed)
Patient scheduled per 4/15 los. Patient will receive updated copy of schedule in treatment area.

## 2018-01-23 NOTE — Assessment & Plan Note (Signed)
She has frequent heartburn from steroid treatment We discussed strategies to reduce stomach reflux and I plan to reduce premedication dexamethasone

## 2018-01-23 NOTE — Assessment & Plan Note (Signed)

## 2018-01-24 ENCOUNTER — Ambulatory Visit (HOSPITAL_COMMUNITY)
Admission: RE | Admit: 2018-01-24 | Discharge: 2018-01-24 | Disposition: A | Discharge status: Home / Self Care | Payer: Medicare Other | Source: Ambulatory Visit | Attending: Hematology and Oncology | Admitting: Hematology and Oncology

## 2018-01-24 ENCOUNTER — Other Ambulatory Visit: Payer: Self-pay | Admitting: Hematology and Oncology

## 2018-01-24 ENCOUNTER — Encounter (HOSPITAL_COMMUNITY): Payer: Self-pay

## 2018-01-24 VITALS — BP 119/61 | HR 79 | Temp 98.4°F | Resp 14

## 2018-01-24 DIAGNOSIS — C8588 Other specified types of non-Hodgkin lymphoma, lymph nodes of multiple sites: Secondary | ICD-10-CM | POA: Insufficient documentation

## 2018-01-24 DIAGNOSIS — C8338 Diffuse large B-cell lymphoma, lymph nodes of multiple sites: Secondary | ICD-10-CM

## 2018-01-24 DIAGNOSIS — C8590 Non-Hodgkin lymphoma, unspecified, unspecified site: Secondary | ICD-10-CM | POA: Diagnosis not present

## 2018-01-24 DIAGNOSIS — C8591 Non-Hodgkin lymphoma, unspecified, lymph nodes of head, face, and neck: Secondary | ICD-10-CM | POA: Insufficient documentation

## 2018-01-24 DIAGNOSIS — Z5111 Encounter for antineoplastic chemotherapy: Secondary | ICD-10-CM | POA: Diagnosis not present

## 2018-01-24 MED ORDER — SODIUM CHLORIDE 0.9 % IJ SOLN
Freq: Once | INTRAMUSCULAR | Status: AC
  Administered 2018-01-24: 12:00:00 via INTRATHECAL
  Filled 2018-01-24: qty 0.48

## 2018-01-24 MED ORDER — LIDOCAINE HCL 1 % IJ SOLN
INTRAMUSCULAR | Status: AC
  Administered 2018-01-24: 5 mL via INTRATRACHEAL
  Filled 2018-01-24: qty 20

## 2018-01-24 NOTE — Procedures (Signed)
   EXAM: INTRATHECAL INJECTION OF METHOTREXATE   FLUOROSCOPY TIME: Fluoroscopy Time:  [2 MINUTES and 30 seconds]  Radiation Exposure Index (if provided by the fluoroscopic device):  [28.8 mGy]  Number of Acquired Spot Images: [0]  PROCEDURE:  Informed consent was obtained from the patient prior to the procedure, including potential complications of headache, allergy, and pain.  With the patient prone, the lower back was prepped with alcohol.  1% Lidocaine was used for local anesthesia.  Lumbar puncture was performed at the [L3-4] level using a [22] gauge needle with return of [clear] CSF.  Using the extension tubing, 12 mg of methotrexate were administered slowly.  The patient tolerated the procedure well and there were no apparent complications.      IMPRESSION: [Non complicated injection of intrathecal methotrexate, as above.]

## 2018-01-24 NOTE — Discharge Instructions (Signed)
Keep dressing on for 24 hours. You may bathe in 24 hours.   Please rest the remainder of the day, lying flat as much as possible.    Intrathecal Chemo, Care After Refer to this sheet in the next few weeks. These instructions provide you with information about caring for yourself after your procedure. Your health care provider may also give you more specific instructions. Your treatment has been planned according to current medical practices, but problems sometimes occur. Call your health care provider if you have any problems or questions after your procedure.  What can I expect after the procedure? After the procedure, it is common to have:  Pain in your back and abdomen.  A slight headache.  A reaction to your pain medicine that may include itchy skin, drowsiness, and nausea.  Follow these instructions at home: Incision care   Follow instructions from your health care provider about how to take care of your incisions. Make sure you: ? Wash your hands with soap and water before you change your bandage (dressing). If soap and water are not available, use hand sanitizer. ? Change your dressing as told by your health care provider. ? Leave stitches (sutures), skin glue, or adhesive strips in place. These skin closures may need to be in place for 2 weeks or longer. If adhesive strip edges start to loosen and curl up, you may trim the loose edges. Do not remove adhesive strips completely unless your health care provider tells you to do that.  Check your incision area every day for signs of infection. Check for: ? More redness, swelling, or pain. ? More fluid or blood. ? Warmth. ? Pus or a bad smell. Medicines  Take over-the-counter and prescription medicines only as told by your health care provider.  Take your antibiotic medicine as told by your health care provider. Do not stop taking the antibiotic even if you start to feel better. Bathing  Do not take baths, swim, or use a hot  tub until your health care provider approves. Ask your health care provider if you can take showers. You may only be allowed to take sponge baths for bathing.  Keep the dressing dry until your health care provider says it can be removed. Driving  Do not drive or operate heavy machinery while on prescription pain medicine. Activity  Limit your movement and activities as told by your health care provider. You may be told to: ? Avoid sleeping on your belly. ? Avoid bending or stretching. ? Avoid raising your arms over your head. ? Avoid lifting anything that is heavier than 5 lb (2.3 kg). ? Avoid doing work around the house, such as loading the dishwasher, vacuuming, or mowing the lawn.  Return to your normal activities as told by your health care provider. Ask your health care provider what activities are safe for you. General instructions  Do not use any tobacco products, such as cigarettes, chewing tobacco, and e-cigarettes. If you need help quitting, ask your health care provider.  Limit alcohol intake to no more than 1 drink per day for nonpregnant women and 2 drinks per day for men. One drink equals 12 oz of beer, 5 oz of wine, or 1 oz of hard liquor.  Do not use recreational drugs.  Keep all follow-up visits as told by your health care provider. This is important. Contact a health care provider if:  You havemore redness, swelling, or pain around your incision.  You have more fluid or blood coming  from your incision.  Your incision feels warm to the touch.  You have pus or a bad smell coming from your incision.  You vomit.  You have a headache for more than 2 days.  You feel confused, anxious, dizzy, or depressed.  You have trouble urinating or having a bowel movement.  Your pain is not being relieved by the pump.  You have a fever.  The alarm on your pump starts to beep. Get help right away if:  You have trouble breathing.  You have a seizure or you pass  out.  You have sudden back pain or weakness in your legs.  You lose control over urination or bowel movements (have incontinence). This information is not intended to replace advice given to you by your health care provider. Make sure you discuss any questions you have with your health care provider. Document Released: 01/13/2009 Document Revised: 03/04/2016 Document Reviewed: 05/19/2015 Elsevier Interactive Patient Education  2018 [employer].

## 2018-01-25 ENCOUNTER — Ambulatory Visit: Payer: Medicare Other

## 2018-01-25 ENCOUNTER — Inpatient Hospital Stay: Payer: Medicare Other

## 2018-01-25 VITALS — BP 130/53 | HR 86 | Temp 97.7°F | Resp 18

## 2018-01-25 DIAGNOSIS — C8338 Diffuse large B-cell lymphoma, lymph nodes of multiple sites: Secondary | ICD-10-CM | POA: Diagnosis not present

## 2018-01-25 DIAGNOSIS — Z5111 Encounter for antineoplastic chemotherapy: Secondary | ICD-10-CM | POA: Diagnosis not present

## 2018-01-25 DIAGNOSIS — D6481 Anemia due to antineoplastic chemotherapy: Secondary | ICD-10-CM | POA: Diagnosis not present

## 2018-01-25 DIAGNOSIS — K5900 Constipation, unspecified: Secondary | ICD-10-CM | POA: Diagnosis not present

## 2018-01-25 DIAGNOSIS — Z5189 Encounter for other specified aftercare: Secondary | ICD-10-CM | POA: Diagnosis not present

## 2018-01-25 DIAGNOSIS — Z5112 Encounter for antineoplastic immunotherapy: Secondary | ICD-10-CM | POA: Diagnosis not present

## 2018-01-25 MED ORDER — PEGFILGRASTIM-CBQV 6 MG/0.6ML ~~LOC~~ SOSY
PREFILLED_SYRINGE | SUBCUTANEOUS | Status: AC
  Filled 2018-01-25: qty 0.6

## 2018-01-25 MED ORDER — PEGFILGRASTIM-CBQV 6 MG/0.6ML ~~LOC~~ SOSY
6.0000 mg | PREFILLED_SYRINGE | Freq: Once | SUBCUTANEOUS | Status: AC
  Administered 2018-01-25: 6 mg via SUBCUTANEOUS

## 2018-01-30 ENCOUNTER — Encounter: Payer: Self-pay | Admitting: Hematology and Oncology

## 2018-01-30 DIAGNOSIS — M84521D Pathological fracture in neoplastic disease, right humerus, subsequent encounter for fracture with routine healing: Secondary | ICD-10-CM | POA: Diagnosis not present

## 2018-02-01 ENCOUNTER — Encounter: Payer: Self-pay | Admitting: Hematology and Oncology

## 2018-02-06 ENCOUNTER — Telehealth: Payer: Self-pay | Admitting: Oncology

## 2018-02-06 NOTE — Telephone Encounter (Signed)
Called patient and her husband to notify them of the appointment for IT chemotherapy on 02/15/18 and to check in at [facility] short stay.  Patient requested for Dr. Jeannine Kitten to preform the procedure as she was not happy with the doctor who preformed the procedure last time.  [facility] and Dr. Jeannine Kitten is at [facility] 02/13/18, 02/14/18 and 02/15/18.  Dr. Laurence Ferrari will be working on 02/15/18 at [facility].  Called patient's husband back and notified him that Dr. Laurence Ferrari will be working on 02/15/18 and will preform the procedure.  He verbalized agreement and said that would be fine.

## 2018-02-07 ENCOUNTER — Ambulatory Visit (HOSPITAL_COMMUNITY): Payer: Medicare Other

## 2018-02-09 ENCOUNTER — Ambulatory Visit (HOSPITAL_COMMUNITY)
Admission: RE | Admit: 2018-02-09 | Discharge: 2018-02-09 | Disposition: A | Discharge status: Home / Self Care | Payer: Medicare Other | Source: Ambulatory Visit | Attending: Hematology and Oncology | Admitting: Hematology and Oncology

## 2018-02-09 DIAGNOSIS — I272 Pulmonary hypertension, unspecified: Secondary | ICD-10-CM | POA: Insufficient documentation

## 2018-02-09 DIAGNOSIS — C8338 Diffuse large B-cell lymphoma, lymph nodes of multiple sites: Secondary | ICD-10-CM | POA: Diagnosis not present

## 2018-02-09 DIAGNOSIS — E785 Hyperlipidemia, unspecified: Secondary | ICD-10-CM | POA: Diagnosis not present

## 2018-02-09 DIAGNOSIS — I313 Pericardial effusion (noninflammatory): Secondary | ICD-10-CM | POA: Diagnosis not present

## 2018-02-09 DIAGNOSIS — K219 Gastro-esophageal reflux disease without esophagitis: Secondary | ICD-10-CM | POA: Diagnosis not present

## 2018-02-09 DIAGNOSIS — J449 Chronic obstructive pulmonary disease, unspecified: Secondary | ICD-10-CM | POA: Insufficient documentation

## 2018-02-09 DIAGNOSIS — I351 Nonrheumatic aortic (valve) insufficiency: Secondary | ICD-10-CM | POA: Insufficient documentation

## 2018-02-09 NOTE — Progress Notes (Signed)
  Echocardiogram 2D Echocardiogram has been performed.  Jannett Celestine 02/09/2018, 11:59 AM

## 2018-02-10 ENCOUNTER — Ambulatory Visit (HOSPITAL_COMMUNITY)
Admission: RE | Admit: 2018-02-10 | Discharge: 2018-02-10 | Disposition: A | Discharge status: Home / Self Care | Payer: Medicare Other | Source: Ambulatory Visit | Attending: Hematology and Oncology | Admitting: Hematology and Oncology

## 2018-02-10 DIAGNOSIS — I700 Atherosclerosis of aorta: Secondary | ICD-10-CM | POA: Diagnosis not present

## 2018-02-10 DIAGNOSIS — C8338 Diffuse large B-cell lymphoma, lymph nodes of multiple sites: Secondary | ICD-10-CM | POA: Diagnosis not present

## 2018-02-10 DIAGNOSIS — I2510 Atherosclerotic heart disease of native coronary artery without angina pectoris: Secondary | ICD-10-CM | POA: Insufficient documentation

## 2018-02-10 DIAGNOSIS — C8333 Diffuse large B-cell lymphoma, intra-abdominal lymph nodes: Secondary | ICD-10-CM | POA: Diagnosis not present

## 2018-02-10 DIAGNOSIS — K449 Diaphragmatic hernia without obstruction or gangrene: Secondary | ICD-10-CM | POA: Diagnosis not present

## 2018-02-10 LAB — GLUCOSE, CAPILLARY: Glucose-Capillary: 98 mg/dL (ref 65–99)

## 2018-02-10 MED ORDER — FLUDEOXYGLUCOSE F - 18 (FDG) INJECTION
6.0000 | Freq: Once | INTRAVENOUS | Status: AC | PRN
  Administered 2018-02-10: 6 via INTRAVENOUS

## 2018-02-13 ENCOUNTER — Inpatient Hospital Stay: Payer: Medicare Other | Attending: Hematology and Oncology

## 2018-02-13 ENCOUNTER — Encounter: Payer: Self-pay | Admitting: Hematology and Oncology

## 2018-02-13 ENCOUNTER — Inpatient Hospital Stay: Payer: Medicare Other

## 2018-02-13 ENCOUNTER — Telehealth: Payer: Self-pay | Admitting: Hematology and Oncology

## 2018-02-13 ENCOUNTER — Inpatient Hospital Stay (HOSPITAL_BASED_OUTPATIENT_CLINIC_OR_DEPARTMENT_OTHER): Payer: Medicare Other | Admitting: Hematology and Oncology

## 2018-02-13 VITALS — BP 111/54 | HR 75 | Temp 97.6°F | Resp 16

## 2018-02-13 DIAGNOSIS — D6481 Anemia due to antineoplastic chemotherapy: Secondary | ICD-10-CM | POA: Diagnosis not present

## 2018-02-13 DIAGNOSIS — Z5189 Encounter for other specified aftercare: Secondary | ICD-10-CM | POA: Insufficient documentation

## 2018-02-13 DIAGNOSIS — Z5111 Encounter for antineoplastic chemotherapy: Secondary | ICD-10-CM | POA: Diagnosis not present

## 2018-02-13 DIAGNOSIS — C8588 Other specified types of non-Hodgkin lymphoma, lymph nodes of multiple sites: Secondary | ICD-10-CM

## 2018-02-13 DIAGNOSIS — E790 Hyperuricemia without signs of inflammatory arthritis and tophaceous disease: Secondary | ICD-10-CM | POA: Insufficient documentation

## 2018-02-13 DIAGNOSIS — D61810 Antineoplastic chemotherapy induced pancytopenia: Secondary | ICD-10-CM | POA: Insufficient documentation

## 2018-02-13 DIAGNOSIS — C8338 Diffuse large B-cell lymphoma, lymph nodes of multiple sites: Secondary | ICD-10-CM

## 2018-02-13 DIAGNOSIS — Z5112 Encounter for antineoplastic immunotherapy: Secondary | ICD-10-CM | POA: Insufficient documentation

## 2018-02-13 DIAGNOSIS — T451X5A Adverse effect of antineoplastic and immunosuppressive drugs, initial encounter: Secondary | ICD-10-CM

## 2018-02-13 DIAGNOSIS — K5909 Other constipation: Secondary | ICD-10-CM | POA: Insufficient documentation

## 2018-02-13 LAB — CBC WITH DIFFERENTIAL (CANCER CENTER ONLY)
Basophils Absolute: 0.1 10*3/uL (ref 0.0–0.1)
Basophils Relative: 1 %
Eosinophils Absolute: 0.1 10*3/uL (ref 0.0–0.5)
Eosinophils Relative: 2 %
HEMATOCRIT: 30.6 % — AB (ref 34.8–46.6)
HEMOGLOBIN: 10.2 g/dL — AB (ref 11.6–15.9)
LYMPHS PCT: 7 %
Lymphs Abs: 0.5 10*3/uL — ABNORMAL LOW (ref 0.9–3.3)
MCH: 32.4 pg (ref 25.1–34.0)
MCHC: 33.4 g/dL (ref 31.5–36.0)
MCV: 96.9 fL (ref 79.5–101.0)
MONO ABS: 0.9 10*3/uL (ref 0.1–0.9)
Monocytes Relative: 12 %
NEUTROS ABS: 5.5 10*3/uL (ref 1.5–6.5)
NEUTROS PCT: 78 %
Platelet Count: 339 10*3/uL (ref 145–400)
RBC: 3.16 MIL/uL — AB (ref 3.70–5.45)
RDW: 18.4 % — AB (ref 11.2–14.5)
WBC Count: 7.1 10*3/uL (ref 3.9–10.3)

## 2018-02-13 LAB — CMP (CANCER CENTER ONLY)
ALK PHOS: 117 U/L (ref 40–150)
ALT: 8 U/L (ref 0–55)
AST: 14 U/L (ref 5–34)
Albumin: 3.5 g/dL (ref 3.5–5.0)
Anion gap: 8 (ref 3–11)
BILIRUBIN TOTAL: 0.4 mg/dL (ref 0.2–1.2)
BUN: 8 mg/dL (ref 7–26)
CALCIUM: 9.6 mg/dL (ref 8.4–10.4)
CO2: 29 mmol/L (ref 22–29)
CREATININE: 0.74 mg/dL (ref 0.60–1.10)
Chloride: 104 mmol/L (ref 98–109)
GFR, Est AFR Am: >60 mL/min (ref >60)
GLUCOSE: 107 mg/dL (ref 70–140)
Potassium: 3.1 mmol/L — ABNORMAL LOW (ref 3.5–5.1)
Sodium: 141 mmol/L (ref 136–145)
TOTAL PROTEIN: 6 g/dL — AB (ref 6.4–8.3)

## 2018-02-13 LAB — URIC ACID: URIC ACID, SERUM: 7.5 mg/dL — AB (ref 2.6–7.4)

## 2018-02-13 MED ORDER — HEPARIN SOD (PORK) LOCK FLUSH 100 UNIT/ML IV SOLN
500.0000 [IU] | Freq: Once | INTRAVENOUS | Status: AC | PRN
  Administered 2018-02-13: 500 [IU]
  Filled 2018-02-13: qty 5

## 2018-02-13 MED ORDER — DOXORUBICIN HCL CHEMO IV INJECTION 2 MG/ML
25.0000 mg/m2 | Freq: Once | INTRAVENOUS | Status: AC
  Administered 2018-02-13: 42 mg via INTRAVENOUS
  Filled 2018-02-13: qty 21

## 2018-02-13 MED ORDER — SODIUM CHLORIDE 0.9 % IV SOLN
Freq: Once | INTRAVENOUS | Status: AC
  Administered 2018-02-13: 11:00:00 via INTRAVENOUS

## 2018-02-13 MED ORDER — PALONOSETRON HCL INJECTION 0.25 MG/5ML
INTRAVENOUS | Status: AC
  Filled 2018-02-13: qty 5

## 2018-02-13 MED ORDER — VINCRISTINE SULFATE CHEMO INJECTION 1 MG/ML
1.0000 mg | Freq: Once | INTRAVENOUS | Status: AC
  Administered 2018-02-13: 1 mg via INTRAVENOUS
  Filled 2018-02-13: qty 1

## 2018-02-13 MED ORDER — SODIUM CHLORIDE 0.9 % IV SOLN
375.0000 mg/m2 | Freq: Once | INTRAVENOUS | Status: AC
  Administered 2018-02-13: 640 mg via INTRAVENOUS
  Filled 2018-02-13: qty 32

## 2018-02-13 MED ORDER — SODIUM CHLORIDE 0.9% FLUSH
10.0000 mL | INTRAVENOUS | Status: DC | PRN
  Administered 2018-02-13: 10 mL
  Filled 2018-02-13: qty 10

## 2018-02-13 MED ORDER — SODIUM CHLORIDE 0.9 % IV SOLN
600.0000 mg | Freq: Once | INTRAVENOUS | Status: AC
  Administered 2018-02-13: 600 mg via INTRAVENOUS
  Filled 2018-02-13: qty 50

## 2018-02-13 MED ORDER — ALLOPURINOL 300 MG PO TABS: 300.0000 mg | ORAL_TABLET | Freq: Every day | ORAL | 1 refills | Status: DC

## 2018-02-13 MED ORDER — SODIUM CHLORIDE 0.9% FLUSH
10.0000 mL | Freq: Once | INTRAVENOUS | Status: AC
  Administered 2018-02-13: 10 mL
  Filled 2018-02-13: qty 10

## 2018-02-13 MED ORDER — PALONOSETRON HCL INJECTION 0.25 MG/5ML
0.2500 mg | Freq: Once | INTRAVENOUS | Status: AC
  Administered 2018-02-13: 0.25 mg via INTRAVENOUS

## 2018-02-13 MED ORDER — ACETAMINOPHEN 325 MG PO TABS
650.0000 mg | ORAL_TABLET | Freq: Once | ORAL | Status: AC
  Administered 2018-02-13: 650 mg via ORAL

## 2018-02-13 MED ORDER — ACETAMINOPHEN 325 MG PO TABS
ORAL_TABLET | ORAL | Status: AC
  Filled 2018-02-13: qty 2

## 2018-02-13 MED ORDER — DIPHENHYDRAMINE HCL 25 MG PO CAPS
50.0000 mg | ORAL_CAPSULE | Freq: Once | ORAL | Status: AC
  Administered 2018-02-13: 50 mg via ORAL

## 2018-02-13 MED ORDER — SODIUM CHLORIDE 0.9 % IV SOLN
Freq: Once | INTRAVENOUS | Status: AC
  Administered 2018-02-13: 11:00:00 via INTRAVENOUS
  Filled 2018-02-13: qty 5

## 2018-02-13 MED ORDER — DIPHENHYDRAMINE HCL 25 MG PO CAPS
ORAL_CAPSULE | ORAL | Status: AC
  Filled 2018-02-13: qty 2

## 2018-02-13 NOTE — Progress Notes (Signed)
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 Echocardiogram showed preserved ejection fraction She will receive 4 doses of intrathecal chemotherapy as scheduled  Anemia due to antineoplastic chemotherapy This is likely due to recent treatment. The patient denies recent history of bleeding such as epistaxis, hematuria or hematochezia. She is asymptomatic from the anemia. I will observe for now.  She does not require transfusion now. I will continue the chemotherapy at current dose without dosage adjustment.  If the anemia gets progressive worse in the future, I might have to delay her treatment or adjust the chemotherapy dose.   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   INTERVAL HISTORY: Please see below for problem oriented charting. She returns with her husband to be seen prior to cycle 4 of treatment She is doing well even though she has lost some weight Denies nausea or constipation No recent infection Denies bone pain. 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 She was treated with chlorambucil in 2000.   --2007 - 09/2008.  Treated with single agent Rituxan.   --12/2008.  She had  progressive adenopathy in chest and abdomen and repeat biopsy showed low grade follicular and diffuse B cell NHL.   --01/2009 - 07/2009. She required left ureteral stent and was treated with Treanda/Rituxan x 5 cycles from 02-2009 thru 06-2009.       11/20/2008 Imaging    Mildly displaced and angulated pathologic fracture of the proximal humeral shaft. 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       09/16/2014 Imaging    No lymphadenopathy in the chest, abdomen, or pelvis. The small right axillary lymph nodes seen on the previous exam are unchanged in the 18 month interval. Small to moderate hiatal hernia.      11/22/2017 PET scan    New hypermetabolic lymphadenopathy within the chest and abdomen, consistent with recurrent lymphoma. New large hypermetabolic mass in the right hepatic lobe, consistent with lymphoma this involvement. New small hypermetabolic mass in the left kidney, which may be due to lymphomatous involvement although primary renal cell carcinoma cannot be excluded. Hypermetabolic mass in the right humerus with pathologic fracture, consistent with lymphomatous involvement. Small hypermetabolic foci in right posterior chest wall, also suspicious for recurrent lymphoma.      11/23/2017 Procedure    Bone, fragment(s), Right Humeral Shaft Reamings - ATYPICAL LYMPHOID INFILTRATE CONSISTENT WITH NON-HODGKIN'S B CELL LYMPHOMA. - SEE ONCOLOGY TABLE. Microscopic Comment LYMPHOMA Histologic type: Non-Hodgkins lymphoma, favor large cell type. Grade (if applicable): Favor high grade. Flow cytometry: No specimen is available for analysis. Immunohistochemical stains: BCL-2, BCL-6, CD3, CD5, CD10, CD15, CD20, CD30, CD34, CD43, LCA, CD79a, CD138, Ki-67, PAX-5 and TdT with appropriate controls as performed on block 1C.  Touch preps/imprints: Not performed. Comments: The sections show multiple, primarily soft tissue fragments displaying a dense lymphoid  infiltrate characterized by a mixture of small round to slightly irregular lymphocytes, histiocytes, and large centroblastic appearing lymphoid cells with partially clumped to vesicular chromatin, small nucleoli and amphophilic to clear cytoplasm. The number of admixed large lymphoid cells is variable but they appear relatively abundant in some areas associated with clustering. No material is available for flow cytometric analysis. Hence, a large batter of immunohistochemical stains were performed and show a mixture of T and B cells but with a significant B cell component mostly composed of large lymphoid cells with positivity for CD79a, PAX-5, CD30, BCL-2, and BCL-6. LCA shows diffuse staining although some of the large lymphoid cells appear negative. No significant staining is seen with CD20, CD10, CD15, CD34, TdT, or CD138. The lack of CD20 expression in this material is possibly related to prior treatment. There is variable increased expression of Ki-67 ranging from 10 to >50% in some areas particularly where there is abundance of large lymphoid cells. There is admixed abundant T cell population in the background as seen with CD3, CD5, and CD43 with on apparent co-expression of CD5 in B cell areas. The overall findings are consistent with involvement by non-Hodgkin's B cell lymphoma and the presence of relative abundance of large lymphoid B-cells and increased Ki-67 expression favor a high grade large B cell lymphoma.      11/23/2017 Surgery    1. CPT 24516-Intramedullary nailing of right humerus fracture 2. CPT 20245-Bone biopsy, deep        11/23/2017 - 11/25/2017 Hospital Admission    She was admitted to the hospital for management of humerus fracture and aggressive lymphoma      11/24/2017 Imaging    ECHO showed LV EF: 65% -   70%      11/25/2017 Procedure    Status post ultrasound-guided biopsy of liver mass. Tissue specimen sent to pathology for complete histopathologic analysis       12/07/2017 Imaging    Successful placement of a right internal jugular approach power injectable Port-A-Cath. The catheter is ready for immediate use.      12/09/2017 -  Chemotherapy    The patient had R-CHOP chemo        12/17/2017 - 12/20/2017 Hospital Admission    She was hospitalized for influenza infection      01/03/2018 Procedure    Unsuccessful initial L4-5 LP. Successful L2-3 lumbar puncture with installation of methotrexate.      01/24/2018 Procedure    Non complicated injection of intrathecal methotrexate, as above.      02/09/2018 Imaging    LV EF: 55% -  60%      02/10/2018 PET scan    1. Interval response to therapy. There has been resolution of previous hypermetabolic tumor within the chest and abdomen. Deauville criteria 2 and 3. 2. Diffuse radiotracer uptake is identified within the axial and appendicular skeleton which is favored to represent treatment related changes. 3. Significant decrease in FDG uptake associated with previously noted hypermetabolic tumor involving the proximal humeral diaphysis. The FDG uptake within this area on today's study is equivalent to that which is seen in the lumbar spine. 4. Aortic Atherosclerosis (ICD10-I70.0). Lad coronary artery calcifications. 5. Hiatal hernia       REVIEW OF SYSTEMS:   Constitutional: Denies fevers, chills or abnormal weight loss Eyes: Denies blurriness of vision Ears, nose, mouth, throat, and face: Denies mucositis or sore throat  Respiratory: Denies cough, dyspnea or wheezes Cardiovascular: Denies palpitation, chest discomfort or lower extremity swelling Gastrointestinal:  Denies nausea, heartburn or change in bowel habits Skin: Denies abnormal skin rashes Lymphatics: Denies new lymphadenopathy or easy bruising Neurological:Denies numbness, tingling or new weaknesses Behavioral/Psych: Mood is stable, no new changes  All other systems were reviewed with the patient and are negative.  I have reviewed the past  medical history, past surgical history, social history and family history with the patient and they are unchanged from previous note.  ALLERGIES:  is allergic to contrast media [iodinated diagnostic agents]; iohexol; povidone-iodine; penicillins; and adhesive [tape].  MEDICATIONS:  Current Outpatient Medications  Medication Sig Dispense Refill  . acetaminophen (TYLENOL) 325 MG tablet Take 2 tablets (650 mg total) by mouth every 6 (six) hours as needed for mild pain (or Fever >/= 101).    Marland Kitchen albuterol (PROVENTIL HFA;VENTOLIN HFA) 108 (90 Base) MCG/ACT inhaler Inhale 2 puffs into the lungs every 6 (six) hours as needed for wheezing or shortness of breath. 1 Inhaler 2  . allopurinol (ZYLOPRIM) 300 MG tablet Take 1 tablet (300 mg total) by mouth daily. 30 tablet 1  . aspirin 81 MG tablet Take 81 mg by mouth daily.    . bisacodyl (DULCOLAX) 5 MG EC tablet Take 5 mg by mouth daily as needed for moderate constipation.    . citalopram (CELEXA) 40 MG tablet TAKE 1 TABLET BY MOUTH DAILY 90 tablet 1  . fluticasone furoate-vilanterol (BREO ELLIPTA) 100-25 MCG/INH AEPB Inhale 1 puff into the lungs every morning.    Marland Kitchen HYDROcodone-acetaminophen (NORCO/VICODIN) 5-325 MG tablet Take 1 tablet by mouth every 4 (four) hours as needed for moderate pain. (Patient taking differently: Take 0.5-1 tablets by mouth every 4 (four) hours as needed for moderate pain. ) 60 tablet 0  . ibuprofen (ADVIL,MOTRIN) 200 MG tablet Take 200 mg by mouth every 6 (six) hours as needed for moderate pain.    . magic mouthwash w/lidocaine SOLN Take 10 mLs by mouth 4 (four) times daily. Swish and swallow 10 ml QID (Patient taking differently: Take 10 mLs by mouth 4 (four) times daily as needed for mouth pain. Swish and swallow 10 ml QID) 240 mL 0  . ondansetron (ZOFRAN) 8 MG tablet Take 1 tablet (8 mg total) by mouth every 8 (eight) hours as needed for nausea. 30 tablet 3  . pantoprazole (PROTONIX) 40 MG tablet Take 1 tablet (40 mg total) by  mouth 2 (two) times daily. 180 tablet 3  . predniSONE (DELTASONE) 20 MG tablet Take 2 tablets (40 mg total) by mouth daily. Start 40 mg daily on 2/23. After chemo, take 40 mg for 4 days 60 tablet 6  . prochlorperazine (COMPAZINE) 10 MG tablet Take 1 tablet (10 mg total) by mouth every 6 (six) hours as needed (Nausea or vomiting). 30 tablet 6  . senna (SENOKOT) 8.6 MG TABS tablet Take 2 tablets (17.2 mg total) by mouth daily as needed for mild constipation. 60 each 0   No current facility-administered medications for this visit.    Facility-Administered Medications Ordered in Other Visits  Medication Dose Route Frequency Provider Last Rate Last Dose  . riTUXimab (RITUXAN) 600 mg in sodium chloride 0.9 % 250 mL (1.9355 mg/mL) infusion  600 mg Intravenous Once Alvy Bimler, Eiza Canniff, MD        PHYSICAL EXAMINATION: ECOG PERFORMANCE STATUS: 1 - Symptomatic but completely ambulatory  Vitals:   02/13/18 0944  BP: 110/65  Pulse: 73  Resp: 18  Temp: 98.1 F (36.7 C)  SpO2: 95%   Filed Weights   02/13/18 0944  Weight: 119 lb 8 oz (54.2 kg)    GENERAL:alert, no distress and comfortable SKIN: skin color, texture, turgor are normal, no rashes or significant lesions EYES: normal, Conjunctiva are pink and non-injected, sclera clear OROPHARYNX:no exudate, no erythema and lips, buccal mucosa, and tongue normal  NECK: supple, thyroid normal size, non-tender, without nodularity LYMPH:  no palpable lymphadenopathy in the cervical, axillary or inguinal LUNGS: clear to auscultation and percussion with normal breathing effort HEART: regular rate & rhythm and no murmurs and no lower extremity edema ABDOMEN:abdomen soft, non-tender and normal bowel sounds Musculoskeletal:no cyanosis of digits and no clubbing  NEURO: alert & oriented x 3 with fluent speech, no focal motor/sensory deficits  LABORATORY DATA:  I have reviewed the data as listed    Component Value Date/Time   NA 141 02/13/2018 0850   NA 142  12/28/2016 1002   K 3.1 (L) 02/13/2018 0850   K 4.6 12/28/2016 1002   CL 104 02/13/2018 0850   CL 101 03/28/2013 0826   CO2 29 02/13/2018 0850   CO2 29 12/28/2016 1002   GLUCOSE 107 02/13/2018 0850   GLUCOSE 85 12/28/2016 1002   GLUCOSE 84 03/28/2013 0826   BUN 8 02/13/2018 0850   BUN 20.3 12/28/2016 1002   CREATININE 0.74 02/13/2018 0850   CREATININE 0.9 12/28/2016 1002   CALCIUM 9.6 02/13/2018 0850   CALCIUM 10.1 12/28/2016 1002   PROT 6.0 (L) 02/13/2018 0850   PROT 6.8 12/28/2016 1002   ALBUMIN 3.5 02/13/2018 0850   ALBUMIN 4.2 12/28/2016 1002   AST 14 02/13/2018 0850   AST 20 12/28/2016 1002   ALT 8 02/13/2018 0850   ALT 12 12/28/2016 1002   ALKPHOS 117 02/13/2018 0850   ALKPHOS 111 12/28/2016 1002   BILITOT 0.4 02/13/2018 0850   BILITOT 0.55 12/28/2016 1002   GFRNONAA >60 02/13/2018 0850   GFRAA >60 02/13/2018 0850    No results found for: SPEP, UPEP  Lab Results  Component Value Date   WBC 7.1 02/13/2018   NEUTROABS 5.5 02/13/2018   HGB 10.2 (L) 02/13/2018   HCT 30.6 (L) 02/13/2018   MCV 96.9 02/13/2018   PLT 339 02/13/2018      Chemistry      Component Value Date/Time   NA 141 02/13/2018 0850   NA 142 12/28/2016 1002   K 3.1 (L) 02/13/2018 0850   K 4.6 12/28/2016 1002   CL 104 02/13/2018 0850   CL 101 03/28/2013 0826   CO2 29 02/13/2018 0850   CO2 29 12/28/2016 1002   BUN 8 02/13/2018 0850   BUN 20.3 12/28/2016 1002   CREATININE 0.74 02/13/2018 0850   CREATININE 0.9 12/28/2016 1002      Component Value Date/Time   CALCIUM 9.6 02/13/2018 0850   CALCIUM 10.1 12/28/2016 1002   ALKPHOS 117 02/13/2018 0850   ALKPHOS 111 12/28/2016 1002   AST 14 02/13/2018 0850   AST 20 12/28/2016 1002   ALT 8 02/13/2018 0850   ALT 12 12/28/2016 1002   BILITOT 0.4 02/13/2018 0850   BILITOT 0.55 12/28/2016 1002       RADIOGRAPHIC STUDIES: I have reviewed imaging study with the patient and her husband I have personally reviewed the radiological images as  listed and agreed with the findings in the report. Nm Pet Image Restag (ps) Skull Base To Thigh  Result Date: 02/10/2018 CLINICAL DATA:  Subsequent treatment strategy  for lymphoma. EXAM: NUCLEAR MEDICINE PET SKULL BASE TO THIGH TECHNIQUE: 6.0 mCi F-18 FDG was injected intravenously. Full-ring PET imaging was performed from the skull base to thigh after the radiotracer. CT data was obtained and used for attenuation correction and anatomic localization. Fasting blood glucose: 98 mg/dl COMPARISON:  11/22/2017 FINDINGS: Mediastinal blood pool activity: SUV max 2.4 NECK: No hypermetabolic lymph nodes in the neck. Incidental CT findings: none CHEST: Resolution of previous hypermetabolic right internal mammary lymph node chain nodes. The hypermetabolic subcarinal lymph node has resolved in the interval. Previous hypermetabolic tumor in the right hilum has an SUV max of 2.27 on today's study, previously 5.73. Previous hypermetabolic right CP angle node has an SUV max of 0.86. Previously 10.8. Incidental CT findings: Large hiatal hernia. Aortic atherosclerosis. Calcifications within the LAD coronary artery noted. ABDOMEN/PELVIS: Previous hypermetabolic tumor involving the posteromedial right lobe of liver measures 6.6 by 3.0 cm on today's study with an SUV max 2.34. On the previous exam this measured 11 by 5.7 with an SUV max of 33.5. Previous hypermetabolic nodal mass within the porta hepatis region measures 1.9 cm and has an SUV max of 2.77. Previously 3.7 cm with an SUV max of 30.29. Left retroperitoneal tumor is decreased size from previous exam. Currently this measures 1.1 cm and has an SUV max of 3.02. Previously 2 cm with an SUV max 14.7. Aortocaval adenopathy measures 8 mm in thickness and has an SUV max 2.78. Previously this measured 1.4 cm and had an SUV max of 7.01. Resolution of previous hypermetabolic lesion involving the lateral cortex of the left kidney. Incidental CT findings: Aortic atherosclerosis. No  aneurysm. SKELETON: There is diffuse radiotracer uptake identified throughout the axial skeleton which is favored to represent treatment related changes. Visualized portion of the proximal right humerus in the area of previously noted tumor has an SUV max equal to 4.5. This is compared with 24.45 previously. Incidental CT findings: none IMPRESSION: 1. Interval response to therapy. There has been resolution of previous hypermetabolic tumor within the chest and abdomen. Deauville criteria 2 and 3. 2. Diffuse radiotracer uptake is identified within the axial and appendicular skeleton which is favored to represent treatment related changes. 3. Significant decrease in FDG uptake associated with previously noted hypermetabolic tumor involving the proximal humeral diaphysis. The FDG uptake within this area on today's study is equivalent to that which is seen in the lumbar spine. 4. Aortic Atherosclerosis (ICD10-I70.0). Lad coronary artery calcifications. 5. Hiatal hernia Electronically Signed   By: Kerby Moors M.D.   On: 02/10/2018 12:05   Dg Fluoro Guided Needle Plc Aspiration/injection Loc  Result Date: 01/24/2018 CLINICAL DATA:  Lymphoma. EXAM: INTRATHECAL INJECTION OF METHOTREXATE FLUOROSCOPY TIME:  Fluoroscopy Time:  2 MINUTES and 30 seconds Radiation Exposure Index (if provided by the fluoroscopic device): 28.8 mGy Number of Acquired Spot Images: 0 PROCEDURE: Informed consent was obtained from the patient prior to the procedure, including potential complications of headache, allergy, and pain. With the patient prone, the lower back was prepped with alcohol. 1% Lidocaine was used for local anesthesia. Lumbar puncture was performed at the L3-4 level using a 22 gauge needle with return of clear CSF. Using the extension tubing, 12 mg of methotrexate were administered slowly. The patient tolerated the procedure well and there were no apparent complications. IMPRESSION: Non complicated injection of intrathecal  methotrexate, as above. Electronically Signed   By: Abigail Miyamoto M.D.   On: 01/24/2018 13:32    All questions were answered. The  patient knows to call the clinic with any problems, questions or concerns. No barriers to learning was detected.  I spent 15 minutes counseling the patient face to face. The total time spent in the appointment was 20 minutes and more than 50% was on counseling and review of test results  [name], MD 02/13/2018 1:08 PM

## 2018-02-13 NOTE — Assessment & Plan Note (Signed)

## 2018-02-13 NOTE — Telephone Encounter (Signed)
Gave patient AVs and calendar of upcoming may and June appointments

## 2018-02-13 NOTE — Assessment & Plan Note (Signed)
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

## 2018-02-13 NOTE — Patient Instructions (Signed)
[facility] Discharge Instructions for Patients Receiving Chemotherapy  Today you received the following chemotherapy agents :  Adriamycin, Vincristine, Cytoxan, Rituxan.  To help prevent nausea and vomiting after your treatment, we encourage you to take your nausea medication as prescribed.   If you develop nausea and vomiting that is not controlled by your nausea medication, call the clinic.   BELOW ARE SYMPTOMS THAT SHOULD BE REPORTED IMMEDIATELY:  *FEVER GREATER THAN 100.5 F  *CHILLS WITH OR WITHOUT FEVER  NAUSEA AND VOMITING THAT IS NOT CONTROLLED WITH YOUR NAUSEA MEDICATION  *UNUSUAL SHORTNESS OF BREATH  *UNUSUAL BRUISING OR BLEEDING  TENDERNESS IN MOUTH AND THROAT WITH OR WITHOUT PRESENCE OF ULCERS  *URINARY PROBLEMS  *BOWEL PROBLEMS  UNUSUAL RASH Items with * indicate a potential emergency and should be followed up as soon as possible.  Feel free to call the clinic should you have any questions or concerns. The clinic phone number is [phone].  Please show the CHEMO ALERT CARD at check-in to the Emergency Department and triage nurse.   

## 2018-02-13 NOTE — Assessment & Plan Note (Signed)
So far, she tolerated reduced dose chemotherapy well We will proceed with a similar dose adjustment Repeat PET CT scan show near complete response to treatment. Echocardiogram showed preserved ejection fraction She will receive 4 doses of intrathecal chemotherapy as scheduled

## 2018-02-14 ENCOUNTER — Encounter: Payer: Self-pay | Admitting: Pharmacist

## 2018-02-14 ENCOUNTER — Ambulatory Visit: Payer: Medicare Other | Admitting: Internal Medicine

## 2018-02-15 ENCOUNTER — Ambulatory Visit (HOSPITAL_COMMUNITY)
Admission: RE | Admit: 2018-02-15 | Discharge: 2018-02-15 | Disposition: A | Discharge status: Home / Self Care | Payer: Medicare Other | Source: Ambulatory Visit | Attending: Hematology and Oncology | Admitting: Hematology and Oncology

## 2018-02-15 ENCOUNTER — Encounter (HOSPITAL_COMMUNITY): Payer: Self-pay

## 2018-02-15 ENCOUNTER — Inpatient Hospital Stay: Payer: Medicare Other

## 2018-02-15 VITALS — BP 124/58 | HR 89 | Temp 97.5°F | Resp 18

## 2018-02-15 DIAGNOSIS — Z5112 Encounter for antineoplastic immunotherapy: Secondary | ICD-10-CM | POA: Diagnosis not present

## 2018-02-15 DIAGNOSIS — C8330 Diffuse large B-cell lymphoma, unspecified site: Secondary | ICD-10-CM | POA: Diagnosis not present

## 2018-02-15 DIAGNOSIS — C8338 Diffuse large B-cell lymphoma, lymph nodes of multiple sites: Secondary | ICD-10-CM | POA: Diagnosis not present

## 2018-02-15 DIAGNOSIS — Z5189 Encounter for other specified aftercare: Secondary | ICD-10-CM | POA: Diagnosis not present

## 2018-02-15 DIAGNOSIS — D61810 Antineoplastic chemotherapy induced pancytopenia: Secondary | ICD-10-CM | POA: Diagnosis not present

## 2018-02-15 DIAGNOSIS — Z5111 Encounter for antineoplastic chemotherapy: Secondary | ICD-10-CM | POA: Diagnosis not present

## 2018-02-15 DIAGNOSIS — E790 Hyperuricemia without signs of inflammatory arthritis and tophaceous disease: Secondary | ICD-10-CM | POA: Diagnosis not present

## 2018-02-15 MED ORDER — SODIUM CHLORIDE 0.9 % IJ SOLN
Freq: Once | INTRAMUSCULAR | Status: AC
  Administered 2018-02-15: 12:00:00 via INTRATHECAL
  Filled 2018-02-15: qty 0.48

## 2018-02-15 MED ORDER — PEGFILGRASTIM-CBQV 6 MG/0.6ML ~~LOC~~ SOSY
PREFILLED_SYRINGE | SUBCUTANEOUS | Status: AC
  Filled 2018-02-15: qty 0.6

## 2018-02-15 MED ORDER — PEGFILGRASTIM-CBQV 6 MG/0.6ML ~~LOC~~ SOSY
6.0000 mg | PREFILLED_SYRINGE | Freq: Once | SUBCUTANEOUS | Status: AC
  Administered 2018-02-15: 6 mg via SUBCUTANEOUS

## 2018-02-15 MED ORDER — LIDOCAINE HCL 1 % IJ SOLN
INTRAMUSCULAR | Status: AC
  Filled 2018-02-15: qty 20

## 2018-02-15 NOTE — Patient Instructions (Signed)
Pegfilgrastim injection What is this medicine? PEGFILGRASTIM (PEG fil gra stim) is a long-acting granulocyte colony-stimulating factor that stimulates the growth of neutrophils, a type of white blood cell important in the body's fight against infection. It is used to reduce the incidence of fever and infection in patients with certain types of cancer who are receiving chemotherapy that affects the bone marrow, and to increase survival after being exposed to high doses of radiation. This medicine may be used for other purposes; ask your health care provider or pharmacist if you have questions. COMMON BRAND NAME(S): Neulasta What should I tell my health care provider before I take this medicine? They need to know if you have any of these conditions: -kidney disease -latex allergy -ongoing radiation therapy -sickle cell disease -skin reactions to acrylic adhesives (On-Body Injector only) -an unusual or allergic reaction to pegfilgrastim, filgrastim, other medicines, foods, dyes, or preservatives -pregnant or trying to get pregnant -breast-feeding How should I use this medicine? This medicine is for injection under the skin. If you get this medicine at home, you will be taught how to prepare and give the pre-filled syringe or how to use the On-body Injector. Refer to the patient Instructions for Use for detailed instructions. Use exactly as directed. Tell your healthcare provider immediately if you suspect that the On-body Injector may not have performed as intended or if you suspect the use of the On-body Injector resulted in a missed or partial dose. It is important that you put your used needles and syringes in a special sharps container. Do not put them in a trash can. If you do not have a sharps container, call your pharmacist or healthcare provider to get one. Talk to your pediatrician regarding the use of this medicine in children. While this drug may be prescribed for selected conditions,  precautions do apply. Overdosage: If you think you have taken too much of this medicine contact a poison control center or emergency room at once. NOTE: This medicine is only for you. Do not share this medicine with others. What if I miss a dose? It is important not to miss your dose. Call your doctor or health care professional if you miss your dose. If you miss a dose due to an On-body Injector failure or leakage, a new dose should be administered as soon as possible using a single prefilled syringe for manual use. What may interact with this medicine? Interactions have not been studied. Give your health care provider a list of all the medicines, herbs, non-prescription drugs, or dietary supplements you use. Also tell them if you smoke, drink alcohol, or use illegal drugs. Some items may interact with your medicine. This list may not describe all possible interactions. Give your health care provider a list of all the medicines, herbs, non-prescription drugs, or dietary supplements you use. Also tell them if you smoke, drink alcohol, or use illegal drugs. Some items may interact with your medicine. What should I watch for while using this medicine? You may need blood work done while you are taking this medicine. If you are going to need a MRI, CT scan, or other procedure, tell your doctor that you are using this medicine (On-Body Injector only). What side effects may I notice from receiving this medicine? Side effects that you should report to your doctor or health care professional as soon as possible: -allergic reactions like skin rash, itching or hives, swelling of the face, lips, or tongue -dizziness -fever -pain, redness, or irritation at site   where injected -pinpoint red spots on the skin -red or dark-brown urine -shortness of breath or breathing problems -stomach or side pain, or pain at the shoulder -swelling -tiredness -trouble passing urine or change in the amount of urine Side  effects that usually do not require medical attention (report to your doctor or health care professional if they continue or are bothersome): -bone pain -muscle pain This list may not describe all possible side effects. Call your doctor for medical advice about side effects. You may report side effects to FDA at 1-800-FDA-1088. Where should I keep my medicine? Keep out of the reach of children. Store pre-filled syringes in a refrigerator between 2 and 8 degrees C (36 and 46 degrees F). Do not freeze. Keep in carton to protect from light. Throw away this medicine if it is left out of the refrigerator for more than 48 hours. Throw away any unused medicine after the expiration date. NOTE: This sheet is a summary. It may not cover all possible information. If you have questions about this medicine, talk to your doctor, pharmacist, or health care provider.  2018 [employer] Standard (2016-09-23 12:58:03)  

## 2018-02-15 NOTE — Discharge Instructions (Signed)
Intrathecal Chemotherapy Lumbar Puncture, Care After Refer to this sheet in the next few weeks. These instructions provide you with information on caring for yourself after your procedure. Your health care provider may also give you more specific instructions. Your treatment has been planned according to current medical practices, but problems sometimes occur. Call your health care provider if you have any problems or questions after your procedure. What can I expect after the procedure? After your procedure, it is typical to have the following sensations:  Mild discomfort or pain at the insertion site.  Mild headache that is relieved with pain medicines.  Follow these instructions at home:   Avoid lifting anything heavier than 10 lb (4.5 kg) for at least 12 hours after the procedure.  Drink enough fluids to keep your urine clear or pale yellow. Contact a health care provider if:  You have fever or chills.  You have nausea or vomiting.  You have a headache that lasts for more than 2 days. Get help right away if:  You have any numbness or tingling in your legs.  You are unable to control your bowel or bladder.  You have bleeding or swelling in your back at the insertion site.  You are dizzy or faint. This information is not intended to replace advice given to you by your health care provider. Make sure you discuss any questions you have with your health care provider. Document Released: 10/02/2013 Document Revised: 03/04/2016 Document Reviewed: 06/05/2013 Elsevier Interactive Patient Education  2017 [employer].

## 2018-02-15 NOTE — Procedures (Signed)
Procedure: LP with fluoroscopic guidance for intrathecal chemo injection at L1-2. Specimen: none Bleeding: minimal. Complications: None immediate. Patient   -Condition: Stable.  -Disposition:  To short stay, and then d/c to home if the patient remains stable.  Full report to follow under IMAGING

## 2018-02-20 ENCOUNTER — Other Ambulatory Visit: Payer: Self-pay | Admitting: Hematology and Oncology

## 2018-02-20 DIAGNOSIS — C8338 Diffuse large B-cell lymphoma, lymph nodes of multiple sites: Secondary | ICD-10-CM

## 2018-02-28 ENCOUNTER — Other Ambulatory Visit: Payer: Self-pay | Admitting: Hematology and Oncology

## 2018-02-28 ENCOUNTER — Telehealth: Payer: Self-pay

## 2018-02-28 NOTE — Telephone Encounter (Signed)
Called and left below message. Ask them to call patient and schedule. Instructed to call the office if needed.

## 2018-02-28 NOTE — Telephone Encounter (Signed)
-----   Message from [name], MD sent at 02/28/2018  3:13 PM EDT ----- Regarding: IR for IT chemo I placed order for IR to do IT chemo for 5/31. Still not scheduled Her husband only allows a few MD to do it. You might want to check with him first

## 2018-03-02 ENCOUNTER — Other Ambulatory Visit (HOSPITAL_COMMUNITY): Payer: Medicare Other

## 2018-03-08 ENCOUNTER — Inpatient Hospital Stay: Payer: Medicare Other

## 2018-03-08 ENCOUNTER — Inpatient Hospital Stay (HOSPITAL_BASED_OUTPATIENT_CLINIC_OR_DEPARTMENT_OTHER): Payer: Medicare Other | Admitting: Hematology and Oncology

## 2018-03-08 ENCOUNTER — Encounter: Payer: Self-pay | Admitting: Hematology and Oncology

## 2018-03-08 DIAGNOSIS — T451X5A Adverse effect of antineoplastic and immunosuppressive drugs, initial encounter: Secondary | ICD-10-CM

## 2018-03-08 DIAGNOSIS — C8338 Diffuse large B-cell lymphoma, lymph nodes of multiple sites: Secondary | ICD-10-CM

## 2018-03-08 DIAGNOSIS — C8588 Other specified types of non-Hodgkin lymphoma, lymph nodes of multiple sites: Secondary | ICD-10-CM

## 2018-03-08 DIAGNOSIS — K5909 Other constipation: Secondary | ICD-10-CM

## 2018-03-08 DIAGNOSIS — Z5111 Encounter for antineoplastic chemotherapy: Secondary | ICD-10-CM | POA: Diagnosis not present

## 2018-03-08 DIAGNOSIS — D6481 Anemia due to antineoplastic chemotherapy: Secondary | ICD-10-CM | POA: Diagnosis not present

## 2018-03-08 DIAGNOSIS — Z5112 Encounter for antineoplastic immunotherapy: Secondary | ICD-10-CM | POA: Diagnosis not present

## 2018-03-08 DIAGNOSIS — E790 Hyperuricemia without signs of inflammatory arthritis and tophaceous disease: Secondary | ICD-10-CM | POA: Diagnosis not present

## 2018-03-08 DIAGNOSIS — D61810 Antineoplastic chemotherapy induced pancytopenia: Secondary | ICD-10-CM | POA: Diagnosis not present

## 2018-03-08 DIAGNOSIS — Z5189 Encounter for other specified aftercare: Secondary | ICD-10-CM | POA: Diagnosis not present

## 2018-03-08 LAB — CBC WITH DIFFERENTIAL (CANCER CENTER ONLY)
BASOS ABS: 0.1 10*3/uL (ref 0.0–0.1)
BASOS PCT: 1 %
EOS ABS: 0.1 10*3/uL (ref 0.0–0.5)
Eosinophils Relative: 2 %
HEMATOCRIT: 31.5 % — AB (ref 34.8–46.6)
Hemoglobin: 10 g/dL — ABNORMAL LOW (ref 11.6–15.9)
Lymphocytes Relative: 16 %
Lymphs Abs: 1.3 10*3/uL (ref 0.9–3.3)
MCH: 33.2 pg (ref 25.1–34.0)
MCHC: 31.7 g/dL (ref 31.5–36.0)
MCV: 104.7 fL — ABNORMAL HIGH (ref 79.5–101.0)
MONO ABS: 0.5 10*3/uL (ref 0.1–0.9)
MONOS PCT: 6 %
Neutro Abs: 5.8 10*3/uL (ref 1.5–6.5)
Neutrophils Relative %: 75 %
PLATELETS: 433 10*3/uL — AB (ref 145–400)
RBC: 3.01 MIL/uL — ABNORMAL LOW (ref 3.70–5.45)
RDW: 19.4 % — AB (ref 11.2–14.5)
WBC Count: 7.7 10*3/uL (ref 3.9–10.3)

## 2018-03-08 LAB — CMP (CANCER CENTER ONLY)
ALBUMIN: 3.6 g/dL (ref 3.5–5.0)
ALT: 11 U/L (ref 0–55)
ANION GAP: 11 (ref 3–11)
AST: 15 U/L (ref 5–34)
Alkaline Phosphatase: 112 U/L (ref 40–150)
BILIRUBIN TOTAL: 0.3 mg/dL (ref 0.2–1.2)
BUN: 12 mg/dL (ref 7–26)
CHLORIDE: 106 mmol/L (ref 98–109)
CO2: 26 mmol/L (ref 22–29)
Calcium: 9.8 mg/dL (ref 8.4–10.4)
Creatinine: 0.76 mg/dL (ref 0.60–1.10)
GFR, Est AFR Am: >60 mL/min (ref >60)
GFR, Estimated: >60 mL/min (ref >60)
GLUCOSE: 81 mg/dL (ref 70–140)
Potassium: 4 mmol/L (ref 3.5–5.1)
SODIUM: 143 mmol/L (ref 136–145)
TOTAL PROTEIN: 6.2 g/dL — AB (ref 6.4–8.3)

## 2018-03-08 LAB — URIC ACID: URIC ACID, SERUM: 3.2 mg/dL (ref 2.6–7.4)

## 2018-03-08 MED ORDER — DIPHENHYDRAMINE HCL 25 MG PO CAPS
ORAL_CAPSULE | ORAL | Status: AC
  Filled 2018-03-08: qty 2

## 2018-03-08 MED ORDER — SODIUM CHLORIDE 0.9 % IV SOLN
375.0000 mg/m2 | Freq: Once | INTRAVENOUS | Status: AC
  Administered 2018-03-08: 600 mg via INTRAVENOUS
  Filled 2018-03-08: qty 10

## 2018-03-08 MED ORDER — SODIUM CHLORIDE 0.9 % IV SOLN
Freq: Once | INTRAVENOUS | Status: AC
  Administered 2018-03-08: 13:00:00 via INTRAVENOUS

## 2018-03-08 MED ORDER — DIPHENHYDRAMINE HCL 25 MG PO CAPS
50.0000 mg | ORAL_CAPSULE | Freq: Once | ORAL | Status: AC
  Administered 2018-03-08: 50 mg via ORAL

## 2018-03-08 MED ORDER — SODIUM CHLORIDE 0.9 % IV SOLN
375.0000 mg/m2 | Freq: Once | INTRAVENOUS | Status: AC
  Administered 2018-03-08: 640 mg via INTRAVENOUS
  Filled 2018-03-08: qty 32

## 2018-03-08 MED ORDER — SODIUM CHLORIDE 0.9% FLUSH
10.0000 mL | INTRAVENOUS | Status: DC | PRN
  Administered 2018-03-08: 10 mL
  Filled 2018-03-08: qty 10

## 2018-03-08 MED ORDER — HEPARIN SOD (PORK) LOCK FLUSH 100 UNIT/ML IV SOLN
500.0000 [IU] | Freq: Once | INTRAVENOUS | Status: AC | PRN
  Administered 2018-03-08: 500 [IU]
  Filled 2018-03-08: qty 5

## 2018-03-08 MED ORDER — ACETAMINOPHEN 325 MG PO TABS
650.0000 mg | ORAL_TABLET | Freq: Once | ORAL | Status: AC
  Administered 2018-03-08: 650 mg via ORAL

## 2018-03-08 MED ORDER — ACETAMINOPHEN 325 MG PO TABS
ORAL_TABLET | ORAL | Status: AC
Start: 2018-03-08 — End: ?
  Filled 2018-03-08: qty 2

## 2018-03-08 MED ORDER — PALONOSETRON HCL INJECTION 0.25 MG/5ML
0.2500 mg | Freq: Once | INTRAVENOUS | Status: AC
  Administered 2018-03-08: 0.25 mg via INTRAVENOUS

## 2018-03-08 MED ORDER — SODIUM CHLORIDE 0.9 % IV SOLN
1.0000 mg | Freq: Once | INTRAVENOUS | Status: AC
  Administered 2018-03-08: 1 mg via INTRAVENOUS
  Filled 2018-03-08: qty 1

## 2018-03-08 MED ORDER — SODIUM CHLORIDE 0.9% FLUSH
10.0000 mL | Freq: Once | INTRAVENOUS | Status: AC
  Administered 2018-03-08: 10 mL
  Filled 2018-03-08: qty 10

## 2018-03-08 MED ORDER — SODIUM CHLORIDE 0.9 % IV SOLN
Freq: Once | INTRAVENOUS | Status: AC
  Administered 2018-03-08: 14:00:00 via INTRAVENOUS
  Filled 2018-03-08: qty 5

## 2018-03-08 MED ORDER — PALONOSETRON HCL INJECTION 0.25 MG/5ML
INTRAVENOUS | Status: AC
  Filled 2018-03-08: qty 5

## 2018-03-08 MED ORDER — DOXORUBICIN HCL CHEMO IV INJECTION 2 MG/ML
25.0000 mg/m2 | Freq: Once | INTRAVENOUS | Status: AC
  Administered 2018-03-08: 42 mg via INTRAVENOUS
  Filled 2018-03-08: qty 21

## 2018-03-08 NOTE — Patient Instructions (Signed)
[facility] Discharge Instructions for Patients Receiving Chemotherapy  Today you received the following chemotherapy agents Adriamycin, Vincristine, Cytoxan, Rituxan.  To help prevent nausea and vomiting after your treatment, we encourage you to take your nausea medication as directed.  If you develop nausea and vomiting that is not controlled by your nausea medication, call the clinic.   BELOW ARE SYMPTOMS THAT SHOULD BE REPORTED IMMEDIATELY:  *FEVER GREATER THAN 100.5 F  *CHILLS WITH OR WITHOUT FEVER  NAUSEA AND VOMITING THAT IS NOT CONTROLLED WITH YOUR NAUSEA MEDICATION  *UNUSUAL SHORTNESS OF BREATH  *UNUSUAL BRUISING OR BLEEDING  TENDERNESS IN MOUTH AND THROAT WITH OR WITHOUT PRESENCE OF ULCERS  *URINARY PROBLEMS  *BOWEL PROBLEMS  UNUSUAL RASH Items with * indicate a potential emergency and should be followed up as soon as possible.  Feel free to call the clinic should you have any questions or concerns. The clinic phone number is [phone].  Please show the CHEMO ALERT CARD at check-in to the Emergency Department and triage nurse.   

## 2018-03-08 NOTE — Assessment & Plan Note (Signed)
So far, she tolerated reduced dose chemotherapy well except for expected side-effects from chemotherapy especially with constipation We will proceed with a similar dose adjustment Her last repeat PET CT scan showed near complete response to treatment. Echocardiogram showed preserved ejection fraction She will receive 4 doses of intrathecal chemotherapy as scheduled; we are unable to proceed this week due to scheduling issue but she will receive her fourth dose of intrathecal treatment with cycle 6 of therapy

## 2018-03-08 NOTE — Assessment & Plan Note (Signed)

## 2018-03-08 NOTE — Progress Notes (Signed)
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 Echocardiogram showed preserved ejection fraction She will receive 4 doses of intrathecal chemotherapy as scheduled; we are unable to proceed this week due to scheduling issue but she will receive her fourth dose of intrathecal treatment with cycle 6 of therapy  Anemia due to antineoplastic chemotherapy This is likely due to recent treatment. The patient denies recent history of bleeding such as epistaxis, hematuria or hematochezia. She is asymptomatic from the anemia. I will observe for now.  She does not require transfusion now. I will continue the chemotherapy at current dose without dosage adjustment.  If the anemia gets progressive worse in the future, I might have to delay her treatment or adjust the chemotherapy dose.   Other constipation We have extensive discussion about laxative therapy due to her chronic constipation.   No orders of the defined types were placed in this encounter.   INTERVAL HISTORY: Please see below for problem oriented charting. 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 She was treated with chlorambucil in 2000.   --2007 - 09/2008.  Treated with single agent Rituxan.   --12/2008.  She had progressive adenopathy in chest and abdomen and repeat biopsy showed low grade follicular and diffuse B cell NHL.   --01/2009 - 07/2009. She required left ureteral stent and was treated with Treanda/Rituxan x 5 cycles from 02-2009 thru 06-2009.       11/20/2008 Imaging    Mildly displaced and angulated pathologic fracture of the proximal humeral shaft. 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       09/16/2014 Imaging    No lymphadenopathy in the chest, abdomen, or pelvis. The small right axillary lymph nodes seen on the previous exam are unchanged in the 18 month interval. Small to moderate hiatal hernia.      11/22/2017 PET scan    New hypermetabolic lymphadenopathy within the chest and abdomen, consistent with recurrent lymphoma. New large hypermetabolic mass in the right hepatic lobe, consistent with lymphoma this involvement. New small hypermetabolic mass in the left kidney, which may be due to lymphomatous involvement although primary renal cell carcinoma cannot be excluded. Hypermetabolic mass in the right humerus with pathologic fracture, consistent with lymphomatous involvement. Small hypermetabolic foci in right posterior chest wall, also suspicious for recurrent lymphoma.      11/23/2017 Procedure    Bone, fragment(s), Right Humeral Shaft Reamings - ATYPICAL LYMPHOID INFILTRATE CONSISTENT WITH NON-HODGKIN'S B CELL LYMPHOMA. - SEE ONCOLOGY TABLE. Microscopic Comment LYMPHOMA Histologic type: Non-Hodgkins lymphoma, favor large  cell type. Grade (if applicable): Favor high grade. Flow cytometry: No specimen is available for analysis. Immunohistochemical stains: BCL-2, BCL-6, CD3, CD5, CD10,  CD15, CD20, CD30, CD34, CD43, LCA, CD79a, CD138, Ki-67, PAX-5 and TdT with appropriate controls as performed on block 1C. Touch preps/imprints: Not performed. Comments: The sections show multiple, primarily soft tissue fragments displaying a dense lymphoid infiltrate characterized by a mixture of small round to slightly irregular lymphocytes, histiocytes, and large centroblastic appearing lymphoid cells with partially clumped to vesicular chromatin, small nucleoli and amphophilic to clear cytoplasm. The number of admixed large lymphoid cells is variable but they appear relatively abundant in some areas associated with clustering. No material is available for flow cytometric analysis. Hence, a large batter of immunohistochemical stains were performed and show a mixture of T and B cells but with a significant B cell component mostly composed of large lymphoid cells with positivity for CD79a, PAX-5, CD30, BCL-2, and BCL-6. LCA shows diffuse staining although some of the large lymphoid cells appear negative. No significant staining is seen with CD20, CD10, CD15, CD34, TdT, or CD138. The lack of CD20 expression in this material is possibly related to prior treatment. There is variable increased expression of Ki-67 ranging from 10 to >50% in some areas particularly where there is abundance of large lymphoid cells. There is admixed abundant T cell population in the background as seen with CD3, CD5, and CD43 with on apparent co-expression of CD5 in B cell areas. The overall findings are consistent with involvement by non-Hodgkin's B cell lymphoma and the presence of relative abundance of large lymphoid B-cells and increased Ki-67 expression favor a high grade large B cell lymphoma.      11/23/2017 Surgery    1. CPT 24516-Intramedullary nailing of right humerus fracture 2. CPT 20245-Bone biopsy, deep        11/23/2017 - 11/25/2017 Hospital Admission    She was admitted to the hospital for management of humerus  fracture and aggressive lymphoma      11/24/2017 Imaging    ECHO showed LV EF: 65% -   70%      11/25/2017 Procedure    Status post ultrasound-guided biopsy of liver mass. Tissue specimen sent to pathology for complete histopathologic analysis      12/07/2017 Imaging    Successful placement of a right internal jugular approach power injectable Port-A-Cath. The catheter is ready for immediate use.      12/09/2017 -  Chemotherapy    The patient had R-CHOP chemo        12/17/2017 - 12/20/2017 Hospital Admission    She was hospitalized for influenza infection      01/03/2018 Procedure    Unsuccessful initial L4-5 LP. Successful L2-3 lumbar puncture with installation of methotrexate.      01/24/2018 Procedure    Non complicated injection of intrathecal methotrexate, as above.      02/09/2018 Imaging    LV EF: 55% -  60%      02/10/2018 PET scan    1. Interval response to therapy. There has been resolution of previous hypermetabolic tumor within the chest and abdomen. Deauville criteria 2 and 3. 2. Diffuse radiotracer uptake is identified within the axial and appendicular skeleton which is favored to represent treatment related changes. 3. Significant decrease in FDG uptake associated with previously noted hypermetabolic tumor involving the proximal humeral diaphysis. The FDG uptake within this area on today's study is equivalent to that which is seen in the lumbar spine. 4. Aortic Atherosclerosis (  ICD10-I70.0). Lad coronary artery calcifications. 5. Hiatal hernia      02/15/2018 Procedure    Fluoroscopic guided lumbar puncture and intrathecal injection of chemotherapy without complication.       REVIEW OF SYSTEMS:   Constitutional: Denies fevers, chills  Eyes: Denies blurriness of vision Ears, nose, mouth, throat, and face: Denies mucositis or sore throat Respiratory: Denies cough, dyspnea or wheezes Cardiovascular: Denies palpitation, chest discomfort or lower extremity  swelling Skin: Denies abnormal skin rashes Lymphatics: Denies new lymphadenopathy or easy bruising Neurological:Denies numbness, tingling or new weaknesses Behavioral/Psych: Mood is stable, no new changes  All other systems were reviewed with the patient and are negative.  I have reviewed the past medical history, past surgical history, social history and family history with the patient and they are unchanged from previous note.  ALLERGIES:  is allergic to contrast media [iodinated diagnostic agents]; iohexol; povidone-iodine; penicillins; and adhesive [tape].  MEDICATIONS:  Current Outpatient Medications  Medication Sig Dispense Refill  . acetaminophen (TYLENOL) 325 MG tablet Take 2 tablets (650 mg total) by mouth every 6 (six) hours as needed for mild pain (or Fever >/= 101).    Marland Kitchen albuterol (PROVENTIL HFA;VENTOLIN HFA) 108 (90 Base) MCG/ACT inhaler Inhale 2 puffs into the lungs every 6 (six) hours as needed for wheezing or shortness of breath. 1 Inhaler 2  . allopurinol (ZYLOPRIM) 300 MG tablet Take 1 tablet (300 mg total) by mouth daily. 30 tablet 1  . aspirin 81 MG tablet Take 81 mg by mouth daily.    . bisacodyl (DULCOLAX) 5 MG EC tablet Take 5 mg by mouth daily as needed for moderate constipation.    . citalopram (CELEXA) 40 MG tablet TAKE 1 TABLET BY MOUTH DAILY 90 tablet 1  . fluticasone furoate-vilanterol (BREO ELLIPTA) 100-25 MCG/INH AEPB Inhale 1 puff into the lungs every morning.    Marland Kitchen HYDROcodone-acetaminophen (NORCO/VICODIN) 5-325 MG tablet Take 1 tablet by mouth every 4 (four) hours as needed for moderate pain. (Patient taking differently: Take 0.5-1 tablets by mouth every 4 (four) hours as needed for moderate pain. ) 60 tablet 0  . ibuprofen (ADVIL,MOTRIN) 200 MG tablet Take 200 mg by mouth every 6 (six) hours as needed for moderate pain.    . magic mouthwash w/lidocaine SOLN Take 10 mLs by mouth 4 (four) times daily. Swish and swallow 10 ml QID (Patient taking differently: Take  10 mLs by mouth 4 (four) times daily as needed for mouth pain. Swish and swallow 10 ml QID) 240 mL 0  . ondansetron (ZOFRAN) 8 MG tablet Take 1 tablet (8 mg total) by mouth every 8 (eight) hours as needed for nausea. 30 tablet 3  . pantoprazole (PROTONIX) 40 MG tablet Take 1 tablet (40 mg total) by mouth 2 (two) times daily. 180 tablet 3  . predniSONE (DELTASONE) 20 MG tablet Take 2 tablets (40 mg total) by mouth daily. Start 40 mg daily on 2/23. After chemo, take 40 mg for 4 days 60 tablet 6  . prochlorperazine (COMPAZINE) 10 MG tablet Take 1 tablet (10 mg total) by mouth every 6 (six) hours as needed (Nausea or vomiting). 30 tablet 6  . senna (SENOKOT) 8.6 MG TABS tablet Take 2 tablets (17.2 mg total) by mouth daily as needed for mild constipation. 60 each 0   No current facility-administered medications for this visit.     PHYSICAL EXAMINATION: ECOG PERFORMANCE STATUS: 1 - Symptomatic but completely ambulatory  Vitals:   03/08/18 1222  BP: (!) 114/56  Pulse: 75  Resp: 18  Temp: 97.9 F (36.6 C)  SpO2: 94%   Filed Weights   03/08/18 1222  Weight: 117 lb 8 oz (53.3 kg)    GENERAL:alert, no distress and comfortable SKIN: skin color, texture, turgor are normal, no rashes or significant lesions EYES: normal, Conjunctiva are pink and non-injected, sclera clear OROPHARYNX:no exudate, no erythema and lips, buccal mucosa, and tongue normal  NECK: supple, thyroid normal size, non-tender, without nodularity LYMPH:  no palpable lymphadenopathy in the cervical, axillary or inguinal LUNGS: clear to auscultation and percussion with normal breathing effort HEART: regular rate & rhythm and no murmurs and no lower extremity edema ABDOMEN:abdomen soft, non-tender and normal bowel sounds Musculoskeletal:no cyanosis of digits and no clubbing  NEURO: alert & oriented x 3 with fluent speech, no focal motor/sensory deficits  LABORATORY DATA:  I have reviewed the data as listed    Component Value  Date/Time   NA 141 02/13/2018 0850   NA 142 12/28/2016 1002   K 3.1 (L) 02/13/2018 0850   K 4.6 12/28/2016 1002   CL 104 02/13/2018 0850   CL 101 03/28/2013 0826   CO2 29 02/13/2018 0850   CO2 29 12/28/2016 1002   GLUCOSE 107 02/13/2018 0850   GLUCOSE 85 12/28/2016 1002   GLUCOSE 84 03/28/2013 0826   BUN 8 02/13/2018 0850   BUN 20.3 12/28/2016 1002   CREATININE 0.74 02/13/2018 0850   CREATININE 0.9 12/28/2016 1002   CALCIUM 9.6 02/13/2018 0850   CALCIUM 10.1 12/28/2016 1002   PROT 6.0 (L) 02/13/2018 0850   PROT 6.8 12/28/2016 1002   ALBUMIN 3.5 02/13/2018 0850   ALBUMIN 4.2 12/28/2016 1002   AST 14 02/13/2018 0850   AST 20 12/28/2016 1002   ALT 8 02/13/2018 0850   ALT 12 12/28/2016 1002   ALKPHOS 117 02/13/2018 0850   ALKPHOS 111 12/28/2016 1002   BILITOT 0.4 02/13/2018 0850   BILITOT 0.55 12/28/2016 1002   GFRNONAA >60 02/13/2018 0850   GFRAA >60 02/13/2018 0850    No results found for: SPEP, UPEP  Lab Results  Component Value Date   WBC 7.7 03/08/2018   NEUTROABS 5.8 03/08/2018   HGB 10.0 (L) 03/08/2018   HCT 31.5 (L) 03/08/2018   MCV 104.7 (H) 03/08/2018   PLT 433 (H) 03/08/2018      Chemistry      Component Value Date/Time   NA 141 02/13/2018 0850   NA 142 12/28/2016 1002   K 3.1 (L) 02/13/2018 0850   K 4.6 12/28/2016 1002   CL 104 02/13/2018 0850   CL 101 03/28/2013 0826   CO2 29 02/13/2018 0850   CO2 29 12/28/2016 1002   BUN 8 02/13/2018 0850   BUN 20.3 12/28/2016 1002   CREATININE 0.74 02/13/2018 0850   CREATININE 0.9 12/28/2016 1002      Component Value Date/Time   CALCIUM 9.6 02/13/2018 0850   CALCIUM 10.1 12/28/2016 1002   ALKPHOS 117 02/13/2018 0850   ALKPHOS 111 12/28/2016 1002   AST 14 02/13/2018 0850   AST 20 12/28/2016 1002   ALT 8 02/13/2018 0850   ALT 12 12/28/2016 1002   BILITOT 0.4 02/13/2018 0850   BILITOT 0.55 12/28/2016 1002       RADIOGRAPHIC STUDIES: I have personally reviewed the radiological images as listed and  agreed with the findings in the report. Nm Pet Image Restag (ps) Skull Base To Thigh  Result Date: 02/10/2018 CLINICAL DATA:  Subsequent treatment strategy for lymphoma. EXAM:  NUCLEAR MEDICINE PET SKULL BASE TO THIGH TECHNIQUE: 6.0 mCi F-18 FDG was injected intravenously. Full-ring PET imaging was performed from the skull base to thigh after the radiotracer. CT data was obtained and used for attenuation correction and anatomic localization. Fasting blood glucose: 98 mg/dl COMPARISON:  11/22/2017 FINDINGS: Mediastinal blood pool activity: SUV max 2.4 NECK: No hypermetabolic lymph nodes in the neck. Incidental CT findings: none CHEST: Resolution of previous hypermetabolic right internal mammary lymph node chain nodes. The hypermetabolic subcarinal lymph node has resolved in the interval. Previous hypermetabolic tumor in the right hilum has an SUV max of 2.27 on today's study, previously 5.73. Previous hypermetabolic right CP angle node has an SUV max of 0.86. Previously 10.8. Incidental CT findings: Large hiatal hernia. Aortic atherosclerosis. Calcifications within the LAD coronary artery noted. ABDOMEN/PELVIS: Previous hypermetabolic tumor involving the posteromedial right lobe of liver measures 6.6 by 3.0 cm on today's study with an SUV max 2.34. On the previous exam this measured 11 by 5.7 with an SUV max of 33.5. Previous hypermetabolic nodal mass within the porta hepatis region measures 1.9 cm and has an SUV max of 2.77. Previously 3.7 cm with an SUV max of 30.29. Left retroperitoneal tumor is decreased size from previous exam. Currently this measures 1.1 cm and has an SUV max of 3.02. Previously 2 cm with an SUV max 14.7. Aortocaval adenopathy measures 8 mm in thickness and has an SUV max 2.78. Previously this measured 1.4 cm and had an SUV max of 7.01. Resolution of previous hypermetabolic lesion involving the lateral cortex of the left kidney. Incidental CT findings: Aortic atherosclerosis. No aneurysm.  SKELETON: There is diffuse radiotracer uptake identified throughout the axial skeleton which is favored to represent treatment related changes. Visualized portion of the proximal right humerus in the area of previously noted tumor has an SUV max equal to 4.5. This is compared with 24.45 previously. Incidental CT findings: none IMPRESSION: 1. Interval response to therapy. There has been resolution of previous hypermetabolic tumor within the chest and abdomen. Deauville criteria 2 and 3. 2. Diffuse radiotracer uptake is identified within the axial and appendicular skeleton which is favored to represent treatment related changes. 3. Significant decrease in FDG uptake associated with previously noted hypermetabolic tumor involving the proximal humeral diaphysis. The FDG uptake within this area on today's study is equivalent to that which is seen in the lumbar spine. 4. Aortic Atherosclerosis (ICD10-I70.0). Lad coronary artery calcifications. 5. Hiatal hernia Electronically Signed   By: Kerby Moors M.D.   On: 02/10/2018 12:05   Dg Fluoro Guided Needle Plc Aspiration/injection Loc  Result Date: 02/15/2018 CLINICAL DATA:  77 year old female with diffuse large B-cell lymphoma presents as an outpatient for intrathecal chemotherapy injection via lumbar puncture. EXAM: FLUOROSCOPICALLY GUIDED LUMBAR PUNCTURE FOR INTRATHECAL CHEMOTHERAPY TECHNIQUE: Informed consent was obtained from the patient prior to the procedure, including potential complications of headache, allergy, and pain. A 'time out' was performed. A suitable skin entry site was identified and sterilize do a alcohol, as the patient has a sensitivity to Betadine. 1% Lidocaine was used for local anesthesia. Lumbar puncture was performed at the L1-L2 level using left sub laminar technique and a 3.5 in by 20 gauge spinal needle with return of clear CSF. No CSF analysis was requested today. 12 milligrams of methotrexate in sodium chloride was injected into the  subarachnoid space. The needle was withdrawn, direct pressure was held, and hemostasis was noted. The patient tolerated the procedure well without apparent complication. The patient  is being monitored and short-stay, and be discharged provided her recovery is uneventful. FLUOROSCOPY TIME:  0 minutes 3 seconds IMPRESSION: Fluoroscopic guided lumbar puncture and intrathecal injection of chemotherapy without complication. Electronically Signed   By: Genevie Ann M.D.   On: 02/15/2018 12:52    All questions were answered. The patient knows to call the clinic with any problems, questions or concerns. No barriers to learning was detected.  I spent 15 minutes counseling the patient face to face. The total time spent in the appointment was 20 minutes and more than 50% was on counseling and review of test results  [name], MD 03/08/2018 12:28 PM

## 2018-03-08 NOTE — Assessment & Plan Note (Signed)
We have extensive discussion about laxative therapy due to her chronic constipation.

## 2018-03-10 ENCOUNTER — Inpatient Hospital Stay: Payer: Medicare Other

## 2018-03-10 VITALS — BP 122/59 | HR 77 | Temp 97.8°F | Resp 18

## 2018-03-10 DIAGNOSIS — C8338 Diffuse large B-cell lymphoma, lymph nodes of multiple sites: Secondary | ICD-10-CM | POA: Diagnosis not present

## 2018-03-10 DIAGNOSIS — D61810 Antineoplastic chemotherapy induced pancytopenia: Secondary | ICD-10-CM | POA: Diagnosis not present

## 2018-03-10 DIAGNOSIS — Z5112 Encounter for antineoplastic immunotherapy: Secondary | ICD-10-CM | POA: Diagnosis not present

## 2018-03-10 DIAGNOSIS — Z5189 Encounter for other specified aftercare: Secondary | ICD-10-CM | POA: Diagnosis not present

## 2018-03-10 DIAGNOSIS — Z5111 Encounter for antineoplastic chemotherapy: Secondary | ICD-10-CM | POA: Diagnosis not present

## 2018-03-10 DIAGNOSIS — E790 Hyperuricemia without signs of inflammatory arthritis and tophaceous disease: Secondary | ICD-10-CM | POA: Diagnosis not present

## 2018-03-10 MED ORDER — PEGFILGRASTIM-CBQV 6 MG/0.6ML ~~LOC~~ SOSY
6.0000 mg | PREFILLED_SYRINGE | Freq: Once | SUBCUTANEOUS | Status: AC
  Administered 2018-03-10: 6 mg via SUBCUTANEOUS

## 2018-03-10 MED ORDER — PEGFILGRASTIM-CBQV 6 MG/0.6ML ~~LOC~~ SOSY
PREFILLED_SYRINGE | SUBCUTANEOUS | Status: AC
  Filled 2018-03-10: qty 0.6

## 2018-03-15 ENCOUNTER — Telehealth: Payer: Self-pay | Admitting: Adult Health

## 2018-03-15 NOTE — Telephone Encounter (Signed)
Patient last seen 3.20.19 by TP and POC was ordered Received fax from [place] stating that patient "request that the oxygen equipment prescribed for Brooke Washington be removed by [place] without the authorization of the patient's attending physician(s) Tammy Parrett..."  TP is aware Fax sent for scan  Will sign off

## 2018-03-29 ENCOUNTER — Encounter: Payer: Self-pay | Admitting: Hematology and Oncology

## 2018-03-29 ENCOUNTER — Telehealth: Payer: Self-pay | Admitting: Hematology and Oncology

## 2018-03-29 ENCOUNTER — Inpatient Hospital Stay: Payer: Medicare Other | Attending: Hematology and Oncology

## 2018-03-29 ENCOUNTER — Inpatient Hospital Stay: Payer: Medicare Other

## 2018-03-29 ENCOUNTER — Inpatient Hospital Stay (HOSPITAL_BASED_OUTPATIENT_CLINIC_OR_DEPARTMENT_OTHER): Payer: Medicare Other | Admitting: Hematology and Oncology

## 2018-03-29 VITALS — BP 101/60 | HR 77 | Temp 97.9°F | Resp 18

## 2018-03-29 DIAGNOSIS — Z5112 Encounter for antineoplastic immunotherapy: Secondary | ICD-10-CM | POA: Insufficient documentation

## 2018-03-29 DIAGNOSIS — C8588 Other specified types of non-Hodgkin lymphoma, lymph nodes of multiple sites: Secondary | ICD-10-CM

## 2018-03-29 DIAGNOSIS — C8338 Diffuse large B-cell lymphoma, lymph nodes of multiple sites: Secondary | ICD-10-CM

## 2018-03-29 DIAGNOSIS — T451X5A Adverse effect of antineoplastic and immunosuppressive drugs, initial encounter: Secondary | ICD-10-CM

## 2018-03-29 DIAGNOSIS — G620 Drug-induced polyneuropathy: Secondary | ICD-10-CM | POA: Diagnosis not present

## 2018-03-29 DIAGNOSIS — D6481 Anemia due to antineoplastic chemotherapy: Secondary | ICD-10-CM

## 2018-03-29 DIAGNOSIS — Z5189 Encounter for other specified aftercare: Secondary | ICD-10-CM | POA: Diagnosis not present

## 2018-03-29 DIAGNOSIS — Z5111 Encounter for antineoplastic chemotherapy: Secondary | ICD-10-CM | POA: Diagnosis not present

## 2018-03-29 DIAGNOSIS — I9589 Other hypotension: Secondary | ICD-10-CM

## 2018-03-29 DIAGNOSIS — I959 Hypotension, unspecified: Secondary | ICD-10-CM | POA: Insufficient documentation

## 2018-03-29 DIAGNOSIS — M8450XA Pathological fracture in neoplastic disease, unspecified site, initial encounter for fracture: Secondary | ICD-10-CM

## 2018-03-29 LAB — CMP (CANCER CENTER ONLY)
ALBUMIN: 3.6 g/dL (ref 3.5–5.0)
ALT: 8 U/L (ref 0–55)
ANION GAP: 9 (ref 3–11)
AST: 14 U/L (ref 5–34)
Alkaline Phosphatase: 120 U/L (ref 40–150)
BILIRUBIN TOTAL: 0.3 mg/dL (ref 0.2–1.2)
BUN: 17 mg/dL (ref 7–26)
CO2: 28 mmol/L (ref 22–29)
Calcium: 9.8 mg/dL (ref 8.4–10.4)
Chloride: 104 mmol/L (ref 98–109)
Creatinine: 0.78 mg/dL (ref 0.60–1.10)
GFR, Est AFR Am: >60 mL/min (ref >60)
GFR, Estimated: >60 mL/min (ref >60)
GLUCOSE: 102 mg/dL (ref 70–140)
POTASSIUM: 3.5 mmol/L (ref 3.5–5.1)
SODIUM: 141 mmol/L (ref 136–145)
TOTAL PROTEIN: 5.9 g/dL — AB (ref 6.4–8.3)

## 2018-03-29 LAB — CBC WITH DIFFERENTIAL (CANCER CENTER ONLY)
BASOS ABS: 0 10*3/uL (ref 0.0–0.1)
Basophils Relative: 1 %
Eosinophils Absolute: 0.2 10*3/uL (ref 0.0–0.5)
Eosinophils Relative: 3 %
HCT: 33.3 % — ABNORMAL LOW (ref 34.8–46.6)
Hemoglobin: 10.4 g/dL — ABNORMAL LOW (ref 11.6–15.9)
LYMPHS PCT: 12 %
Lymphs Abs: 0.9 10*3/uL (ref 0.9–3.3)
MCH: 32.8 pg (ref 25.1–34.0)
MCHC: 31.2 g/dL — ABNORMAL LOW (ref 31.5–36.0)
MCV: 105 fL — AB (ref 79.5–101.0)
MONO ABS: 0.4 10*3/uL (ref 0.1–0.9)
Monocytes Relative: 6 %
Neutro Abs: 5.7 10*3/uL (ref 1.5–6.5)
Neutrophils Relative %: 78 %
Platelet Count: 326 10*3/uL (ref 145–400)
RBC: 3.17 MIL/uL — ABNORMAL LOW (ref 3.70–5.45)
RDW: 18.4 % — AB (ref 11.2–14.5)
WBC Count: 7.2 10*3/uL (ref 3.9–10.3)

## 2018-03-29 LAB — URIC ACID: Uric Acid, Serum: 7.8 mg/dL — ABNORMAL HIGH (ref 2.6–7.4)

## 2018-03-29 MED ORDER — DIPHENHYDRAMINE HCL 25 MG PO CAPS
50.0000 mg | ORAL_CAPSULE | Freq: Once | ORAL | Status: AC
  Administered 2018-03-29: 50 mg via ORAL

## 2018-03-29 MED ORDER — ACETAMINOPHEN 325 MG PO TABS
650.0000 mg | ORAL_TABLET | Freq: Once | ORAL | Status: AC
  Administered 2018-03-29: 650 mg via ORAL

## 2018-03-29 MED ORDER — VINCRISTINE SULFATE CHEMO INJECTION 1 MG/ML
1.0000 mg | Freq: Once | INTRAVENOUS | Status: AC
  Administered 2018-03-29: 1 mg via INTRAVENOUS
  Filled 2018-03-29: qty 1

## 2018-03-29 MED ORDER — SODIUM CHLORIDE 0.9% FLUSH
10.0000 mL | INTRAVENOUS | Status: DC | PRN
  Administered 2018-03-29: 10 mL
  Filled 2018-03-29: qty 10

## 2018-03-29 MED ORDER — DIPHENHYDRAMINE HCL 25 MG PO CAPS
ORAL_CAPSULE | ORAL | Status: AC
  Filled 2018-03-29: qty 2

## 2018-03-29 MED ORDER — SODIUM CHLORIDE 0.9 % IV SOLN
375.0000 mg/m2 | Freq: Once | INTRAVENOUS | Status: AC
  Administered 2018-03-29: 640 mg via INTRAVENOUS
  Filled 2018-03-29: qty 32

## 2018-03-29 MED ORDER — DOXORUBICIN HCL CHEMO IV INJECTION 2 MG/ML
25.0000 mg/m2 | Freq: Once | INTRAVENOUS | Status: AC
  Administered 2018-03-29: 42 mg via INTRAVENOUS
  Filled 2018-03-29: qty 21

## 2018-03-29 MED ORDER — SODIUM CHLORIDE 0.9 % IV SOLN
375.0000 mg/m2 | Freq: Once | INTRAVENOUS | Status: AC
Start: 2018-03-29 — End: 2018-03-29
  Administered 2018-03-29: 600 mg via INTRAVENOUS
  Filled 2018-03-29: qty 10

## 2018-03-29 MED ORDER — SODIUM CHLORIDE 0.9 % IV SOLN
Freq: Once | INTRAVENOUS | Status: AC
  Administered 2018-03-29: 11:00:00 via INTRAVENOUS

## 2018-03-29 MED ORDER — SODIUM CHLORIDE 0.9 % IV SOLN
Freq: Once | INTRAVENOUS | Status: AC
  Administered 2018-03-29: 12:00:00 via INTRAVENOUS
  Filled 2018-03-29: qty 5

## 2018-03-29 MED ORDER — PALONOSETRON HCL INJECTION 0.25 MG/5ML
0.2500 mg | Freq: Once | INTRAVENOUS | Status: AC
  Administered 2018-03-29: 0.25 mg via INTRAVENOUS

## 2018-03-29 MED ORDER — PALONOSETRON HCL INJECTION 0.25 MG/5ML
INTRAVENOUS | Status: AC
  Filled 2018-03-29: qty 5

## 2018-03-29 MED ORDER — ACETAMINOPHEN 325 MG PO TABS
ORAL_TABLET | ORAL | Status: AC
  Filled 2018-03-29: qty 2

## 2018-03-29 MED ORDER — HEPARIN SOD (PORK) LOCK FLUSH 100 UNIT/ML IV SOLN
500.0000 [IU] | Freq: Once | INTRAVENOUS | Status: AC | PRN
  Administered 2018-03-29: 500 [IU]
  Filled 2018-03-29: qty 5

## 2018-03-29 NOTE — Progress Notes (Signed)
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 Echocardiogram showed preserved ejection fraction She will receive her fourth dose of intrathecal treatment with cycle 6 of therapy I plan to repeat PET scan again in August to document complete response to treatment I have briefly discussed with patient and husband about consideration for consolidation treatment as maintenance therapy after chemotherapy    Anemia due to antineoplastic chemotherapy This is likely due to recent treatment. The patient denies recent history of bleeding such as epistaxis, hematuria or hematochezia. She is asymptomatic from the anemia. I will observe for now.   Peripheral neuropathy due to chemotherapy [facility]) She will continue minor dose adjustment for neuropathy.  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 NM PET Image Restag (PS) Skull Base To Thigh    Standing Status:   Future    Standing Expiration Date:   03/30/2019    Order Specific Question:   If indicated for the ordered procedure, I authorize the administration of a radiopharmaceutical per Radiology protocol    Answer:   Yes    Order Specific Question:    Preferred imaging location?    Answer:   [facility]    Order Specific Question:   Radiology Contrast Protocol - do NOT remove file path    Answer:   [email or web address]    INTERVAL HISTORY: Please see below for problem oriented charting. She returns with her husband for cycle 6 of chemotherapy She tolerated last cycle of therapy well She have not lost any weight She denies bone pain She has mild peripheral neuropathy, stable Denies significant nausea or constipation She have occasional symptoms of dizziness/fuzziness of her head but denies recent chest pain, shortness of breath or falls Her husband has numerous questions in regards to future follow-up after completion of treatment  SUMMARY OF ONCOLOGIC HISTORY: Oncology History   High IPI score      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[place])   11/27/2007 Initial Diagnosis    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 She was treated with chlorambucil in 2000.   --2007 - 09/2008.  Treated with single agent Rituxan.   --12/2008.  She had progressive adenopathy in chest and abdomen and repeat biopsy showed low grade follicular and diffuse B cell NHL.   --01/2009 - 07/2009. She required left ureteral stent and was treated with Treanda/Rituxan x 5 cycles from 02-2009 thru 06-2009.       11/20/2008 Imaging    Mildly displaced and angulated pathologic fracture of the proximal humeral shaft. 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  09/16/2014 Imaging    No lymphadenopathy in the chest, abdomen, or pelvis. The small right axillary lymph nodes seen on the previous exam are unchanged in the 18 month interval. Small to moderate hiatal hernia.      11/22/2017 PET scan    New  hypermetabolic lymphadenopathy within the chest and abdomen, consistent with recurrent lymphoma. New large hypermetabolic mass in the right hepatic lobe, consistent with lymphoma this involvement. New small hypermetabolic mass in the left kidney, which may be due to lymphomatous involvement although primary renal cell carcinoma cannot be excluded. Hypermetabolic mass in the right humerus with pathologic fracture, consistent with lymphomatous involvement. Small hypermetabolic foci in right posterior chest wall, also suspicious for recurrent lymphoma.      11/23/2017 Procedure    Bone, fragment(s), Right Humeral Shaft Reamings - ATYPICAL LYMPHOID INFILTRATE CONSISTENT WITH NON-HODGKIN'S B CELL LYMPHOMA. - SEE ONCOLOGY TABLE. Microscopic Comment LYMPHOMA Histologic type: Non-Hodgkins lymphoma, favor large cell type. Grade (if applicable): Favor high grade. Flow cytometry: No specimen is available for analysis. Immunohistochemical stains: BCL-2, BCL-6, CD3, CD5, CD10, CD15, CD20, CD30, CD34, CD43, LCA, CD79a, CD138, Ki-67, PAX-5 and TdT with appropriate controls as performed on block 1C. Touch preps/imprints: Not performed. Comments: The sections show multiple, primarily soft tissue fragments displaying a dense lymphoid infiltrate characterized by a mixture of small round to slightly irregular lymphocytes, histiocytes, and large centroblastic appearing lymphoid cells with partially clumped to vesicular chromatin, small nucleoli and amphophilic to clear cytoplasm. The number of admixed large lymphoid cells is variable but they appear relatively abundant in some areas associated with clustering. No material is available for flow cytometric analysis. Hence, a large batter of immunohistochemical stains were performed and show a mixture of T and B cells but with a significant B cell component mostly composed of large lymphoid cells with positivity for CD79a, PAX-5, CD30, BCL-2, and BCL-6. LCA shows diffuse  staining although some of the large lymphoid cells appear negative. No significant staining is seen with CD20, CD10, CD15, CD34, TdT, or CD138. The lack of CD20 expression in this material is possibly related to prior treatment. There is variable increased expression of Ki-67 ranging from 10 to >50% in some areas particularly where there is abundance of large lymphoid cells. There is admixed abundant T cell population in the background as seen with CD3, CD5, and CD43 with on apparent co-expression of CD5 in B cell areas. The overall findings are consistent with involvement by non-Hodgkin's B cell lymphoma and the presence of relative abundance of large lymphoid B-cells and increased Ki-67 expression favor a high grade large B cell lymphoma.      11/23/2017 Surgery    1. CPT 24516-Intramedullary nailing of right humerus fracture 2. CPT 20245-Bone biopsy, deep        11/23/2017 - 11/25/2017 Hospital Admission    She was admitted to the hospital for management of humerus fracture and aggressive lymphoma      11/24/2017 Imaging    ECHO showed LV EF: 65% -   70%      11/25/2017 Procedure    Status post ultrasound-guided biopsy of liver mass. Tissue specimen sent to pathology for complete histopathologic analysis      12/07/2017 Imaging    Successful placement of a right internal jugular approach power injectable Port-A-Cath. The catheter is ready for immediate use.      12/09/2017 -  Chemotherapy    The patient had R-CHOP chemo        12/17/2017 - 12/20/2017  Hospital Admission    She was hospitalized for influenza infection      01/03/2018 Procedure    Unsuccessful initial L4-5 LP. Successful L2-3 lumbar puncture with installation of methotrexate.      01/24/2018 Procedure    Non complicated injection of intrathecal methotrexate, as above.      02/09/2018 Imaging    LV EF: 55% -  60%      02/10/2018 PET scan    1. Interval response to therapy. There has been resolution of previous  hypermetabolic tumor within the chest and abdomen. Deauville criteria 2 and 3. 2. Diffuse radiotracer uptake is identified within the axial and appendicular skeleton which is favored to represent treatment related changes. 3. Significant decrease in FDG uptake associated with previously noted hypermetabolic tumor involving the proximal humeral diaphysis. The FDG uptake within this area on today's study is equivalent to that which is seen in the lumbar spine. 4. Aortic Atherosclerosis (ICD10-I70.0). Lad coronary artery calcifications. 5. Hiatal hernia      02/15/2018 Procedure    Fluoroscopic guided lumbar puncture and intrathecal injection of chemotherapy without complication.       REVIEW OF SYSTEMS:   Constitutional: Denies fevers, chills or abnormal weight loss Eyes: Denies blurriness of vision Ears, nose, mouth, throat, and face: Denies mucositis or sore throat Respiratory: Denies cough, dyspnea or wheezes Cardiovascular: Denies palpitation, chest discomfort or lower extremity swelling Gastrointestinal:  Denies nausea, heartburn or change in bowel habits Skin: Denies abnormal skin rashes Lymphatics: Denies new lymphadenopathy or easy bruising Neurological:Denies numbness, tingling or new weaknesses Behavioral/Psych: Mood is stable, no new changes  All other systems were reviewed with the patient and are negative.  I have reviewed the past medical history, past surgical history, social history and family history with the patient and they are unchanged from previous note.  ALLERGIES:  is allergic to contrast media [iodinated diagnostic agents]; iohexol; povidone-iodine; penicillins; and adhesive [tape].  MEDICATIONS:  Current Outpatient Medications  Medication Sig Dispense Refill  . acetaminophen (TYLENOL) 325 MG tablet Take 2 tablets (650 mg total) by mouth every 6 (six) hours as needed for mild pain (or Fever >/= 101).    Marland Kitchen albuterol (PROVENTIL HFA;VENTOLIN HFA) 108 (90 Base)  MCG/ACT inhaler Inhale 2 puffs into the lungs every 6 (six) hours as needed for wheezing or shortness of breath. 1 Inhaler 2  . allopurinol (ZYLOPRIM) 300 MG tablet Take 1 tablet (300 mg total) by mouth daily. 30 tablet 1  . aspirin 81 MG tablet Take 81 mg by mouth daily.    . bisacodyl (DULCOLAX) 5 MG EC tablet Take 5 mg by mouth daily as needed for moderate constipation.    . citalopram (CELEXA) 40 MG tablet TAKE 1 TABLET BY MOUTH DAILY 90 tablet 1  . fluticasone furoate-vilanterol (BREO ELLIPTA) 100-25 MCG/INH AEPB Inhale 1 puff into the lungs every morning.    Marland Kitchen HYDROcodone-acetaminophen (NORCO/VICODIN) 5-325 MG tablet Take 1 tablet by mouth every 4 (four) hours as needed for moderate pain. (Patient taking differently: Take 0.5-1 tablets by mouth every 4 (four) hours as needed for moderate pain. ) 60 tablet 0  . ibuprofen (ADVIL,MOTRIN) 200 MG tablet Take 200 mg by mouth every 6 (six) hours as needed for moderate pain.    . magic mouthwash w/lidocaine SOLN Take 10 mLs by mouth 4 (four) times daily. Swish and swallow 10 ml QID (Patient taking differently: Take 10 mLs by mouth 4 (four) times daily as needed for mouth pain. Swish and swallow  10 ml QID) 240 mL 0  . ondansetron (ZOFRAN) 8 MG tablet Take 1 tablet (8 mg total) by mouth every 8 (eight) hours as needed for nausea. 30 tablet 3  . pantoprazole (PROTONIX) 40 MG tablet Take 1 tablet (40 mg total) by mouth 2 (two) times daily. 180 tablet 3  . predniSONE (DELTASONE) 20 MG tablet Take 2 tablets (40 mg total) by mouth daily. Start 40 mg daily on 2/23. After chemo, take 40 mg for 4 days 60 tablet 6  . prochlorperazine (COMPAZINE) 10 MG tablet Take 1 tablet (10 mg total) by mouth every 6 (six) hours as needed (Nausea or vomiting). 30 tablet 6  . senna (SENOKOT) 8.6 MG TABS tablet Take 2 tablets (17.2 mg total) by mouth daily as needed for mild constipation. 60 each 0   No current facility-administered medications for this visit.     PHYSICAL  EXAMINATION: ECOG PERFORMANCE STATUS: 1 - Symptomatic but completely ambulatory  Vitals:   03/29/18 0950  BP: (!) 119/57  Pulse: 99  Resp: 16  Temp: (!) 97.5 F (36.4 C)  SpO2: 94%   Filed Weights   03/29/18 0950  Weight: 117 lb 3.2 oz (53.2 kg)    GENERAL:alert, no distress and comfortable SKIN: skin color, texture, turgor are normal, no rashes or significant lesions EYES: normal, Conjunctiva are pink and non-injected, sclera clear OROPHARYNX:no exudate, no erythema and lips, buccal mucosa, and tongue normal  NECK: supple, thyroid normal size, non-tender, without nodularity LYMPH:  no palpable lymphadenopathy in the cervical, axillary or inguinal LUNGS: clear to auscultation and percussion with normal breathing effort HEART: regular rate & rhythm and no murmurs and no lower extremity edema ABDOMEN:abdomen soft, non-tender and normal bowel sounds Musculoskeletal:no cyanosis of digits and no clubbing  NEURO: alert & oriented x 3 with fluent speech, no focal motor/sensory deficits  LABORATORY DATA:  I have reviewed the data as listed    Component Value Date/Time   NA 141 03/29/2018 0921   NA 142 12/28/2016 1002   K 3.5 03/29/2018 0921   K 4.6 12/28/2016 1002   CL 104 03/29/2018 0921   CL 101 03/28/2013 0826   CO2 28 03/29/2018 0921   CO2 29 12/28/2016 1002   GLUCOSE 102 03/29/2018 0921   GLUCOSE 85 12/28/2016 1002   GLUCOSE 84 03/28/2013 0826   BUN 17 03/29/2018 0921   BUN 20.3 12/28/2016 1002   CREATININE 0.78 03/29/2018 0921   CREATININE 0.9 12/28/2016 1002   CALCIUM 9.8 03/29/2018 0921   CALCIUM 10.1 12/28/2016 1002   PROT 5.9 (L) 03/29/2018 0921   PROT 6.8 12/28/2016 1002   ALBUMIN 3.6 03/29/2018 0921   ALBUMIN 4.2 12/28/2016 1002   AST 14 03/29/2018 0921   AST 20 12/28/2016 1002   ALT 8 03/29/2018 0921   ALT 12 12/28/2016 1002   ALKPHOS 120 03/29/2018 0921   ALKPHOS 111 12/28/2016 1002   BILITOT 0.3 03/29/2018 0921   BILITOT 0.55 12/28/2016 1002    GFRNONAA >60 03/29/2018 0921   GFRAA >60 03/29/2018 0921    No results found for: SPEP, UPEP  Lab Results  Component Value Date   WBC 7.2 03/29/2018   NEUTROABS 5.7 03/29/2018   HGB 10.4 (L) 03/29/2018   HCT 33.3 (L) 03/29/2018   MCV 105.0 (H) 03/29/2018   PLT 326 03/29/2018      Chemistry      Component Value Date/Time   NA 141 03/29/2018 0921   NA 142 12/28/2016 1002   K  3.5 03/29/2018 0921   K 4.6 12/28/2016 1002   CL 104 03/29/2018 0921   CL 101 03/28/2013 0826   CO2 28 03/29/2018 0921   CO2 29 12/28/2016 1002   BUN 17 03/29/2018 0921   BUN 20.3 12/28/2016 1002   CREATININE 0.78 03/29/2018 0921   CREATININE 0.9 12/28/2016 1002      Component Value Date/Time   CALCIUM 9.8 03/29/2018 0921   CALCIUM 10.1 12/28/2016 1002   ALKPHOS 120 03/29/2018 0921   ALKPHOS 111 12/28/2016 1002   AST 14 03/29/2018 0921   AST 20 12/28/2016 1002   ALT 8 03/29/2018 0921   ALT 12 12/28/2016 1002   BILITOT 0.3 03/29/2018 0921   BILITOT 0.55 12/28/2016 1002      All questions were answered. The patient knows to call the clinic with any problems, questions or concerns. No barriers to learning was detected.  I spent 25 minutes counseling the patient face to face. The total time spent in the appointment was 30 minutes and more than 50% was on counseling and review of test results  [name], MD 03/29/2018 10:23 AM

## 2018-03-29 NOTE — Assessment & Plan Note (Signed)
She will continue minor dose adjustment for neuropathy.  It is not worse

## 2018-03-29 NOTE — Patient Instructions (Signed)
[facility] Discharge Instructions for Patients Receiving Chemotherapy  Today you received the following chemotherapy agents Adriamycin, Rituxan, Vincristine, and Cytoxan To help prevent nausea and vomiting after your treatment, we encourage you to take your nausea medication as directed   If you develop nausea and vomiting that is not controlled by your nausea medication, call the clinic.   BELOW ARE SYMPTOMS THAT SHOULD BE REPORTED IMMEDIATELY:  *FEVER GREATER THAN 100.5 F  *CHILLS WITH OR WITHOUT FEVER  NAUSEA AND VOMITING THAT IS NOT CONTROLLED WITH YOUR NAUSEA MEDICATION  *UNUSUAL SHORTNESS OF BREATH  *UNUSUAL BRUISING OR BLEEDING  TENDERNESS IN MOUTH AND THROAT WITH OR WITHOUT PRESENCE OF ULCERS  *URINARY PROBLEMS  *BOWEL PROBLEMS  UNUSUAL RASH Items with * indicate a potential emergency and should be followed up as soon as possible.  Feel free to call the clinic should you have any questions or concerns. The clinic phone number is [phone].  Please show the [place] at check-in to the Emergency Department and triage nurse.

## 2018-03-29 NOTE — Assessment & Plan Note (Signed)
This is likely due to recent treatment. The patient denies recent history of bleeding such as epistaxis, hematuria or hematochezia. She is asymptomatic from the anemia. I will observe for now.   

## 2018-03-29 NOTE — Assessment & Plan Note (Signed)
She is mildly symptomatic from low blood pressure We discussed strategies and management and increase oral intake as tolerated

## 2018-03-29 NOTE — Telephone Encounter (Signed)
Gave avs and calendar ° °

## 2018-03-29 NOTE — Assessment & Plan Note (Addendum)
So far, she tolerated reduced dose chemotherapy well except for expected side-effects from chemotherapy especially with constipation and mild neuropathy We will proceed with a similar dose adjustment Her last repeat PET CT scan showed near complete response to treatment. Echocardiogram showed preserved ejection fraction She will receive her fourth dose of intrathecal treatment with cycle 6 of therapy I plan to repeat PET scan again in August to document complete response to treatment I have briefly discussed with patient and husband about consideration for consolidation treatment as maintenance therapy after chemotherapy

## 2018-03-29 NOTE — Assessment & Plan Note (Signed)
Depending on the outcome of PET CT scan, she may need consolidation treatment with radiation therapy We will discuss that further in her next visit

## 2018-03-31 ENCOUNTER — Encounter (HOSPITAL_COMMUNITY): Payer: Self-pay

## 2018-03-31 ENCOUNTER — Inpatient Hospital Stay: Payer: Medicare Other

## 2018-03-31 ENCOUNTER — Other Ambulatory Visit (HOSPITAL_COMMUNITY): Payer: Self-pay | Admitting: Diagnostic Radiology

## 2018-03-31 ENCOUNTER — Other Ambulatory Visit: Payer: Self-pay | Admitting: Hematology and Oncology

## 2018-03-31 ENCOUNTER — Ambulatory Visit (HOSPITAL_COMMUNITY)
Admission: RE | Admit: 2018-03-31 | Discharge: 2018-03-31 | Disposition: A | Discharge status: Home / Self Care | Payer: Medicare Other | Source: Ambulatory Visit | Attending: Hematology and Oncology | Admitting: Hematology and Oncology

## 2018-03-31 DIAGNOSIS — Z5111 Encounter for antineoplastic chemotherapy: Secondary | ICD-10-CM | POA: Diagnosis not present

## 2018-03-31 DIAGNOSIS — C8330 Diffuse large B-cell lymphoma, unspecified site: Secondary | ICD-10-CM | POA: Diagnosis not present

## 2018-03-31 DIAGNOSIS — C8338 Diffuse large B-cell lymphoma, lymph nodes of multiple sites: Secondary | ICD-10-CM

## 2018-03-31 DIAGNOSIS — D6481 Anemia due to antineoplastic chemotherapy: Secondary | ICD-10-CM | POA: Diagnosis not present

## 2018-03-31 DIAGNOSIS — I959 Hypotension, unspecified: Secondary | ICD-10-CM | POA: Diagnosis not present

## 2018-03-31 DIAGNOSIS — Z5112 Encounter for antineoplastic immunotherapy: Secondary | ICD-10-CM | POA: Diagnosis not present

## 2018-03-31 DIAGNOSIS — G620 Drug-induced polyneuropathy: Secondary | ICD-10-CM | POA: Diagnosis not present

## 2018-03-31 MED ORDER — SODIUM CHLORIDE 0.9 % IJ SOLN
12.0000 mg | Freq: Once | INTRAMUSCULAR | Status: AC
  Administered 2018-03-31: 12 mg via INTRATHECAL
  Filled 2018-03-31: qty 0.48

## 2018-03-31 MED ORDER — PEGFILGRASTIM-CBQV 6 MG/0.6ML ~~LOC~~ SOSY
6.0000 mg | PREFILLED_SYRINGE | Freq: Once | SUBCUTANEOUS | Status: AC
  Administered 2018-03-31: 6 mg via SUBCUTANEOUS

## 2018-03-31 MED ORDER — PEGFILGRASTIM INJECTION 6 MG/0.6ML ~~LOC~~
PREFILLED_SYRINGE | SUBCUTANEOUS | Status: AC
  Filled 2018-03-31: qty 0.6

## 2018-03-31 NOTE — Discharge Instructions (Signed)
Intrathecal Chemo, Care After Refer to this sheet in the next few weeks. These instructions provide you with information about caring for yourself after your procedure. Your health care provider may also give you more specific instructions. Your treatment has been planned according to current medical practices, but problems sometimes occur. Call your health care provider if you have any problems or questions after your procedure. What can I expect after the procedure? After the procedure, it is common to have:  Pain in your back and abdomen.  A slight headache.  A reaction to your pain medicine that may include itchy skin, drowsiness, and nausea.  Follow these instructions at home: Incision care   Follow instructions from your health care provider about how to take care of your incisions. Make sure you: ? Wash your hands with soap and water before you change your bandage (dressing). If soap and water are not available, use hand sanitizer. ? Change your dressing as told by your health care provider.  Check your incision area every day for signs of infection. Check for: ? More redness, swelling, or pain. ? More fluid or blood. ? Warmth. ? Pus or a bad smell. Medicines  Take over-the-counter and prescription medicines only as told by your health care provider.  Take your antibiotic medicine as told by your health care provider. Do not stop taking the antibiotic even if you start to feel better. Bathing  Do not take baths, swim, or use a hot tub until your health care provider approves. Ask your health care provider if you can take showers. You may only be allowed to take sponge baths for bathing.  Keep the dressing dry until your health care provider says it can be removed. Driving  Do not drive for 2 weeks or until your health care provider says it is okay.  Do not drive or operate heavy machinery while on prescription pain medicine. Activity  Limit your movement and activities  as told by your health care provider. You may be told to: ? Avoid sleeping on your belly. ? Avoid bending or stretching. ? Avoid raising your arms over your head. ? Avoid lifting anything that is heavier than 5 lb (2.3 kg). ? Avoid doing work around the house, such as loading the dishwasher, vacuuming, or mowing the lawn.  Return to your normal activities as told by your health care provider. Ask your health care provider what activities are safe for you. General instructions  Do not use any tobacco products, such as cigarettes, chewing tobacco, and e-cigarettes. If you need help quitting, ask your health care provider.  Limit alcohol intake to no more than 1 drink per day for nonpregnant women and 2 drinks per day for men. One drink equals 12 oz of beer, 5 oz of wine, or 1 oz of hard liquor.  Do not use recreational drugs.  Keep all follow-up visits as told by your health care provider. This is important. Contact a health care provider if:  You havemore redness, swelling, or pain around your incision.  You have more fluid or blood coming from your incision.  Your incision feels warm to the touch.  You have pus or a bad smell coming from your incision.  You vomit.  You have a headache for more than 2 days.  You feel confused, anxious, dizzy, or depressed.  You have trouble urinating or having a bowel movement.  Your pain is not being relieved by the pump.  You have a fever.  The  alarm on your pump starts to beep. Get help right away if:  You have trouble breathing.  You have a seizure or you pass out.  You have sudden back pain or weakness in your legs.  You lose control over urination or bowel movements (have incontinence). This information is not intended to replace advice given to you by your health care provider. Make sure you discuss any questions you have with your health care provider. Document Released: 01/13/2009 Document Revised: 03/04/2016 Document  Reviewed: 05/19/2015 Elsevier Interactive Patient Education  2018 [employer].

## 2018-03-31 NOTE — Patient Instructions (Signed)
Pegfilgrastim injection What is this medicine? PEGFILGRASTIM (PEG fil gra stim) is a long-acting granulocyte colony-stimulating factor that stimulates the growth of neutrophils, a type of white blood cell important in the body's fight against infection. It is used to reduce the incidence of fever and infection in patients with certain types of cancer who are receiving chemotherapy that affects the bone marrow, and to increase survival after being exposed to high doses of radiation. This medicine may be used for other purposes; ask your health care provider or pharmacist if you have questions. COMMON BRAND NAME(S): Neulasta What should I tell my health care provider before I take this medicine? They need to know if you have any of these conditions: -kidney disease -latex allergy -ongoing radiation therapy -sickle cell disease -skin reactions to acrylic adhesives (On-Body Injector only) -an unusual or allergic reaction to pegfilgrastim, filgrastim, other medicines, foods, dyes, or preservatives -pregnant or trying to get pregnant -breast-feeding How should I use this medicine? This medicine is for injection under the skin. If you get this medicine at home, you will be taught how to prepare and give the pre-filled syringe or how to use the On-body Injector. Refer to the patient Instructions for Use for detailed instructions. Use exactly as directed. Tell your healthcare provider immediately if you suspect that the On-body Injector may not have performed as intended or if you suspect the use of the On-body Injector resulted in a missed or partial dose. It is important that you put your used needles and syringes in a special sharps container. Do not put them in a trash can. If you do not have a sharps container, call your pharmacist or healthcare provider to get one. Talk to your pediatrician regarding the use of this medicine in children. While this drug may be prescribed for selected conditions,  precautions do apply. Overdosage: If you think you have taken too much of this medicine contact a poison control center or emergency room at once. NOTE: This medicine is only for you. Do not share this medicine with others. What if I miss a dose? It is important not to miss your dose. Call your doctor or health care professional if you miss your dose. If you miss a dose due to an On-body Injector failure or leakage, a new dose should be administered as soon as possible using a single prefilled syringe for manual use. What may interact with this medicine? Interactions have not been studied. Give your health care provider a list of all the medicines, herbs, non-prescription drugs, or dietary supplements you use. Also tell them if you smoke, drink alcohol, or use illegal drugs. Some items may interact with your medicine. This list may not describe all possible interactions. Give your health care provider a list of all the medicines, herbs, non-prescription drugs, or dietary supplements you use. Also tell them if you smoke, drink alcohol, or use illegal drugs. Some items may interact with your medicine. What should I watch for while using this medicine? You may need blood work done while you are taking this medicine. If you are going to need a MRI, CT scan, or other procedure, tell your doctor that you are using this medicine (On-Body Injector only). What side effects may I notice from receiving this medicine? Side effects that you should report to your doctor or health care professional as soon as possible: -allergic reactions like skin rash, itching or hives, swelling of the face, lips, or tongue -dizziness -fever -pain, redness, or irritation at site   where injected -pinpoint red spots on the skin -red or dark-brown urine -shortness of breath or breathing problems -stomach or side pain, or pain at the shoulder -swelling -tiredness -trouble passing urine or change in the amount of urine Side  effects that usually do not require medical attention (report to your doctor or health care professional if they continue or are bothersome): -bone pain -muscle pain This list may not describe all possible side effects. Call your doctor for medical advice about side effects. You may report side effects to FDA at 1-800-FDA-1088. Where should I keep my medicine? Keep out of the reach of children. Store pre-filled syringes in a refrigerator between 2 and 8 degrees C (36 and 46 degrees F). Do not freeze. Keep in carton to protect from light. Throw away this medicine if it is left out of the refrigerator for more than 48 hours. Throw away any unused medicine after the expiration date. NOTE: This sheet is a summary. It may not cover all possible information. If you have questions about this medicine, talk to your doctor, pharmacist, or health care provider.  2018 [employer] Standard (2016-09-23 12:58:03)  

## 2018-04-05 ENCOUNTER — Encounter: Payer: Self-pay | Admitting: Hematology and Oncology

## 2018-04-10 ENCOUNTER — Encounter: Payer: Self-pay | Admitting: Hematology and Oncology

## 2018-04-10 ENCOUNTER — Other Ambulatory Visit: Payer: Self-pay | Admitting: Hematology and Oncology

## 2018-04-10 DIAGNOSIS — C8338 Diffuse large B-cell lymphoma, lymph nodes of multiple sites: Secondary | ICD-10-CM

## 2018-04-10 DIAGNOSIS — R5381 Other malaise: Secondary | ICD-10-CM | POA: Insufficient documentation

## 2018-04-19 ENCOUNTER — Encounter: Payer: Self-pay | Admitting: Physical Therapy

## 2018-04-19 ENCOUNTER — Ambulatory Visit: Payer: Medicare Other | Attending: Hematology and Oncology | Admitting: Physical Therapy

## 2018-04-19 ENCOUNTER — Other Ambulatory Visit: Payer: Self-pay

## 2018-04-19 DIAGNOSIS — R2689 Other abnormalities of gait and mobility: Secondary | ICD-10-CM | POA: Diagnosis not present

## 2018-04-19 DIAGNOSIS — M6281 Muscle weakness (generalized): Secondary | ICD-10-CM | POA: Diagnosis not present

## 2018-04-19 NOTE — Therapy (Signed)
[facility] [address] [place], [place], [address] Phone: [phone]   Fax:  [phone]  Physical Therapy Evaluation  Patient Details  Name: Brooke [place] MRN: [id number] Date of Birth: 02/01/1941 Referring Provider: Dr. Ivor Messier   Encounter Date: 04/19/2018  PT End of Session - 04/19/18 1314    Visit Number  1    Date for PT Re-Evaluation  06/14/18    Authorization Type  Medicare    PT Start Time  1230    PT Stop Time  1310    PT Time Calculation (min)  40 min    Activity Tolerance  Patient tolerated treatment well    Behavior During Therapy  [facility] for tasks assessed/performed       Past Medical History:  Diagnosis Date  . Cancer ([place])  APRIL OF 2000   LOW-GRADE NON-HODGKIN'S LYMPHOMA  . COPD (chronic obstructive pulmonary disease) ([place])   . Emphysema of lung ([place])   . Hyperlipidemia     Past Surgical History:  Procedure Laterality Date  . 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 IR FLUORO GUIDE PORT INSERTION RIGHT  12/07/2017  . IR US GUIDE VASC ACCESS RIGHT  12/07/2017  . ORIF HUMERUS FRACTURE Right 11/23/2017   Procedure: OPEN REDUCTION INTERNAL FIXATION (ORIF) HUMERAL SHAFT FRACTURE;  Surgeon: Shona Needles, MD;  Location: [place];  Service: Orthopedics;  Laterality: Right;  . RIGHT OOPHORECTOMY  1976    There were no vitals filed for this visit.   Subjective Assessment - 04/19/18 1235    Subjective  Patient has had 6 rounds of chemotherapy that started last March. Patient broke her right arm in March and that is when she found out about the cancer. Patient had ORIF of right humerus and is healed now.  Patient has neuropathy in her feet.  Patient has fallen 2 times due to not wearing shoes and peripheral neruopathy.     Patient Stated Goals  increase strength and improve balance    Currently in Pain?  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 Ivor Messier    Onset Date/Surgical Date  11/22/17    Prior Therapy  None      Precautions   Precautions  Other (comment);Fall    Precaution Comments  cancer; fall risk      Restrictions   Weight Bearing Restrictions  No      Balance Screen   Has the patient fallen in the past 6 months  Yes    How many times?  2    Has the patient had a decrease in activity level because of a fear of falling?   Yes    Is the patient reluctant to leave their home because of a fear of falling?   Yes      [place] residence    Type of [place] Access  Level entry    [place]  Two level    Additional Comments  patient bedroom on second floor but right now she is sleeping on the first floor      Prior Function  Level of Independence  Independent    Vocation  Retired      Charity fundraiser Status  Within Functional Limits for tasks assessed      Sensation   Light Touch  Impaired Detail    Light Touch Impaired Details  -- decreased on bilateral feet      Posture/Postural Control   Posture/Postural Control  No significant limitations      ROM / Strength   AROM / PROM / Strength  AROM;PROM;Strength      AROM   Overall AROM   Within functional limits for tasks performed      PROM   Right Ankle Dorsiflexion  -20 knee flexed -5    Left Ankle Dorsiflexion  -20 knee flexed 0 degrees      Strength   Right Shoulder Flexion  4/5    Right Shoulder Extension  4/5    Right Shoulder ABduction  4/5    Right Shoulder Internal Rotation  4-/5    Right Shoulder External Rotation  3/5    Right Elbow Extension  4-/5    Left Elbow Extension  4-/5    Right Hip Flexion  4/5    Right Hip Extension  4/5    Right Hip ABduction  3/5     Left Hip Flexion  4/5    Left Hip Extension  4/5    Left Hip ABduction  3/5    Right Knee Flexion  4/5    Right Knee Extension  4/5    Left Knee Flexion  3/5    Left Knee Extension  4/5    Right Ankle Dorsiflexion  4-/5    Right Ankle Plantar Flexion  3-/5    Right Ankle Eversion  4-/5    Left Ankle Dorsiflexion  4-/5    Left Ankle Plantar Flexion  3-/5    Left Ankle Eversion  4-/5      Transfers   Five time sit to stand comments   not able to do due to difficutly with going into standing from sitting having to use her hands and increased exertion      Ambulation/Gait   Ambulation/Gait  Yes    Gait Pattern  Decreased dorsiflexion - right;Decreased dorsiflexion - left;Decreased stride length;Decreased step length - right;Decreased step length - left;Decreased stance time - right;Decreased stance time - left;Trunk flexed;Wide base of support;Poor foot clearance - left;Poor foot clearance - right    Stairs  No not able to do them      6 minute walk test results    Aerobic Endurance Distance Walked  339    Endurance additional comments  for 2.48 min      Standardized Balance Assessment   Standardized Balance Assessment  Timed Up and Go Test;Five Times Sit to Stand      Timed Up and Go Test   TUG  Normal TUG    Normal TUG (seconds)  19    TUG Comments  increased risk for falls                Objective measurements completed on examination: See above findings.              PT Education - 04/19/18 1313    Education Details  walking program, how to stretch dorsiflexion    Person(s) Educated  Patient    Methods  Explanation;Demonstration;Verbal cues;Handout    Comprehension  Returned demonstration;Verbalized understanding       PT Short Term Goals -  04/19/18 1427      PT SHORT TERM GOAL #1   Title  understand tips to avoid falls    Time  4    Period  Weeks    Status  New    Target Date  05/17/18      PT SHORT TERM GOAL #2   Title  increased  bilateral ankle dorsiflexion so she is able to walk with heel toe gait instead of shuffling    Time  4    Period  Weeks    Status  New    Target Date  05/17/18      PT SHORT TERM GOAL #3   Title  TUG score </= 15 seconds    Time  4    Period  Weeks    Status  New    Target Date  05/17/18      PT SHORT TERM GOAL #4   Title  ambulate for 5 mintues with minimal fatique for 400 feet    Time  4    Period  Weeks    Status  New    Target Date  05/17/18        PT Long Term Goals - 04/19/18 1429      PT LONG TERM GOAL #1   Title  independent with HEP and understand how to progress herself    Time  8    Period  Weeks    Status  New    Target Date  06/14/18      PT LONG TERM GOAL #2   Title  abilty to ambulate 6 minutes without a rest due to improved endurance and gait    Time  8    Period  Weeks    Status  New    Target Date  06/14/18      PT LONG TERM GOAL #3   Title  abilty to go up and down her steps with step over step pattern due to increased bilateral lower extremity strength >/= 4+/5    Time  8    Period  Weeks    Status  New    Target Date  06/14/18      PT LONG TERM GOAL #4   Title  right shoulder strength >/= 4+/5 so she is able to lift an object to the top shelf with greater ease    Time  8    Period  Weeks    Status  New    Target Date  06/14/18      PT LONG TERM GOAL #5   Title  TUG score </= 13 seconds to reduce her risk of falls in a public place    Time  8    Period  Weeks    Status  New    Target Date  06/14/18             Plan - 04/19/18 1412    Clinical Impression Statement  Patient is a 77 year old female with diagnosis of deconditioning after chemotherapy for diffuse large B-cell lymphoma. Patient was diagnosed on 11/26/2017. Patient reached to pull up her pants when she broke her right humerus.  She had ORIF of right humerus and in the hsopital from 11/23/17-11/25/2017.  Patient had chemotherapy from 12/09/2017-03/31/2018.  Patient reports  peripheral neuropathy on bilateral feet and hand.  Patient reports no pain until she was leaving the evaluation and talked about back pain which can be assessed next visit.  Patient has decreased  sensation on bilateral feet.  Patient has decreased bilateral gastroc and soleus length.  Patient has decreased bilateral ankle DF PROM  to 0 degrees on the left with knee flexed and -5 degrees with right knee flexed. Right shoulder strength is 4/5 with exception of external rotation is 3/5.  Bilateral elbow extension is 4/5.  Bilateral hip strength is 4/5 with exception of hip abduction 3/5. Bilateral ankle strength is 4/5 with exception of plantarflexion 3-/5.  Patient ambulates with decreased heel strike, decreased step length, forward flexed trunk and shuffle gait.  TUG score is 19 seconds.  Patient walks 339 feet in 2 minutes 48 seconds.  Patient has to use her arms to go into standing from a sitting position  with increased exertion.  Patient will benefit from skilled therapy to improve her overall strength and balance to improve mobility and balance.     History and Personal Factors relevant to plan of care:  ORIF right humerus 11/23/2017; diffuse large B cell lymphoma of lymph nodes of multiple regions 11/26/2017; peripheral neuropathy    Clinical Presentation  Evolving    Clinical Presentation due to:  patient has fallen, patient has decreased balance. patient has decreased endurance that she is only able to walk 2 and half minutes at a time    Clinical Decision Making  Moderate    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; peripheral neuropathy    PT Frequency  2x / week    PT Duration  8 weeks    PT Treatment/Interventions  Electrical Stimulation;Moist Heat;Cryotherapy;Gait training;Stair training;Therapeutic activities;Therapeutic exercise;Balance  training;Patient/family education;Neuromuscular re-education;Manual techniques;Passive range of motion;Energy conservation    PT Next Visit Plan  work on bilateral ankle DF and stretch gastro; nustep; hip stretches; exercises in the chair at first to do at home; information on falls    PT Home Exercise Plan  tips to avoid falls    Consulted and Agree with Plan of Care  Patient       Patient will benefit from skilled therapeutic intervention in order to improve the following deficits and impairments:  Abnormal gait, Increased fascial restricitons, Pain, Decreased coordination, Decreased mobility, Increased muscle spasms, Decreased activity tolerance, Decreased endurance, Decreased range of motion, Decreased strength, Difficulty walking, Impaired flexibility  Visit Diagnosis: Muscle weakness (generalized) - Plan: PT plan of care cert/re-cert  Other abnormalities of gait and mobility - Plan: PT plan of care cert/re-cert     Problem List Patient Active Problem List   Diagnosis Date Noted  . Physical deconditioning 04/10/2018  . Peripheral neuropathy due to chemotherapy ([place]) 03/29/2018  . Hypotension, unspecified 03/29/2018  . Anemia due to antineoplastic chemotherapy 01/23/2018  . Chronic respiratory failure with hypoxia ([place]) 12/30/2017  . Other constipation 12/29/2017  . Neutropenic fever ([place]) 12/18/2017  . Thrombocytopenia ([place]) 12/18/2017  . Abdominal fullness 12/18/2017  . Acute urinary retention 12/18/2017  . Cough in adult 12/15/2017  . Heart burn 12/15/2017  . Frequent headaches 12/15/2017  . Chemotherapy-induced nausea 12/12/2017  . Diffuse large B cell lymphoma ([place]) 11/29/2017  . Goals of care, counseling/discussion 11/29/2017  . Pathological fracture in neoplastic disease, right humerus, initial encounter for fracture 11/24/2017  . Hyperuricemia 11/22/2017  . Pathological fracture due to neoplastic disease, initial encounter 11/21/2017  . COPD GOLD II 07/05/2017  .  COLONIC POLYPS, HYPERPLASTIC, HX OF 05/28/2010  . Large cell lymphoma of  multiple sites ([place]) 11/27/2007  . Depression 05/05/2007    Earlie Counts, PT 04/19/18 2:35 PM   [facility] [address], [place], [place], [address] Phone: [phone]   Fax:  [phone]  Name: Brooke [place] MRN: [id number] Date of Birth: 02/04/1941

## 2018-04-19 NOTE — Patient Instructions (Signed)
  Ways to get started on an exercise program  Start for 3 minutes 3 time per day Ankle Dorsiflexion, Self-Mobilization, Sitting    Sit with feet flat. Slide one foot back until gentle stretch is felt. Keep entire foot on floor. Hold _15__ seconds. Repeat __3_ times per session. Do _3__ sessions per day.  Copyright  VHI. All rights reserved.  1. with a walking program. 2. Work towards 30 minutes of exercise per day 3. When you do an aerobic exercise program start on a low level 4. Water aerobics is a good place due to decreased strain on your joints 5. Begin your exercise program gradually and progress slowly over time 6. When exercising use correct form. a. Keep neutral spine b. Engage abdominals c. Keep chest up  d. Chin down e. Do not lock your knees  Earlie Counts, PT Outpatient Rehab at [facility] [address] [place], [place] [address] [phone]

## 2018-04-25 ENCOUNTER — Ambulatory Visit: Payer: Medicare Other | Admitting: Physical Therapy

## 2018-04-25 ENCOUNTER — Encounter: Payer: Self-pay | Admitting: Physical Therapy

## 2018-04-25 DIAGNOSIS — R2689 Other abnormalities of gait and mobility: Secondary | ICD-10-CM

## 2018-04-25 DIAGNOSIS — M6281 Muscle weakness (generalized): Secondary | ICD-10-CM

## 2018-04-25 NOTE — Therapy (Signed)
[facility] [address], [place], [place], [address] Phone: [phone]   Fax:  [phone]  Physical Therapy Treatment  Patient Details  Name: Brooke [place] MRN: [id number] Date of Birth: [birth year] Referring Provider: Dr. Ivor Messier   Encounter Date: 04/25/2018  PT End of Session - 04/25/18 1054    Visit Number  2    Date for PT Re-Evaluation  06/14/18    Authorization Type  Medicare    PT Start Time  1012    PT Stop Time  1054    PT Time Calculation (min)  42 min    Activity Tolerance  Patient tolerated treatment well       Past Medical History:  Diagnosis Date  . Cancer ([place])  APRIL OF 2000   LOW-GRADE NON-HODGKIN'S LYMPHOMA  . COPD (chronic obstructive pulmonary disease) ([place])   . Emphysema of lung ([place])   . Hyperlipidemia     Past Surgical History:  Procedure Laterality Date  . 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 IR FLUORO GUIDE PORT INSERTION RIGHT  12/07/2017  . IR US GUIDE VASC ACCESS RIGHT  12/07/2017  . ORIF HUMERUS FRACTURE Right 11/23/2017   Procedure: OPEN REDUCTION INTERNAL FIXATION (ORIF) HUMERAL SHAFT FRACTURE;  Surgeon: Shona Needles, MD;  Location: [place];  Service: Orthopedics;  Laterality: Right;  . RIGHT OOPHORECTOMY  1976    There were no vitals filed for this visit.  Subjective Assessment - 04/25/18 1012    Subjective  I did 3 3-minute walks since I was last here.  It's hard to balance with the peripheral neuropathy in my feet.      Patient Stated Goals  increase strength and improve balance    Currently in Pain?  No/denies    Multiple Pain Sites  No                       [facility] Adult PT Treatment/Exercise - 04/25/18 0001      Therapeutic Activites    Therapeutic Activities  ADL's    ADL's  adjusted quad cane height and foot adjustment;  sit to stand, walking,standing        Knee/Hip Exercises: Stretches   Active Hamstring Stretch  Right;Left;3 reps seated      Knee/Hip Exercises: Aerobic   Nustep  4 min L1 seat 7 multiple rest breaks needed  pauses to rest after each minute secondary to fatigue      Knee/Hip Exercises: Seated   Long Arc Quad  AROM;Right;Left;10 reps    Ball Squeeze  10x    Clamshell with TheraBand  Yellow 10x3 double and single leg    Other Seated Knee/Hip Exercises  yellow band press out 10x right/left    Other Seated Knee/Hip Exercises  heel raises 10x    Marching  AROM;Right;Left;10 reps    Sit to Sand  5 reps very high mat table 3x             PT Education - 04/25/18 1046    Education Details  Access Code: [email or web address]  fall safety;  seated exercises with and without yellow band    Person(s) Educated  Patient    Methods  Explanation;Demonstration    Comprehension  Returned demonstration;Verbalized understanding       PT Short Term Goals - 04/19/18 1427      PT SHORT TERM GOAL #1  Title  understand tips to avoid falls    Time  4    Period  Weeks    Status  New    Target Date  05/17/18      PT SHORT TERM GOAL #2   Title  increased bilateral ankle dorsiflexion so she is able to walk with heel toe gait instead of shuffling    Time  4    Period  Weeks    Status  New    Target Date  05/17/18      PT SHORT TERM GOAL #3   Title  TUG score </= 15 seconds    Time  4    Period  Weeks    Status  New    Target Date  05/17/18      PT SHORT TERM GOAL #4   Title  ambulate for 5 mintues with minimal fatique for 400 feet    Time  4    Period  Weeks    Status  New    Target Date  05/17/18        PT Long Term Goals - 04/19/18 1429      PT LONG TERM GOAL #1   Title  independent with HEP and understand how to progress herself    Time  8    Period  Weeks    Status  New    Target Date  06/14/18      PT LONG TERM GOAL #2   Title  abilty to ambulate 6 minutes without a rest due to improved endurance and gait    Time  8     Period  Weeks    Status  New    Target Date  06/14/18      PT LONG TERM GOAL #3   Title  abilty to go up and down her steps with step over step pattern due to increased bilateral lower extremity strength >/= 4+/5    Time  8    Period  Weeks    Status  New    Target Date  06/14/18      PT LONG TERM GOAL #4   Title  right shoulder strength >/= 4+/5 so she is able to lift an object to the top shelf with greater ease    Time  8    Period  Weeks    Status  New    Target Date  06/14/18      PT LONG TERM GOAL #5   Title  TUG score </= 13 seconds to reduce her risk of falls in a public place    Time  8    Period  Weeks    Status  New    Target Date  06/14/18            Plan - 04/25/18 1046    Clinical Impression Statement  The patient presents with a quad cane which she has begun using today secondary to her unsteadiness.  Adjusted fit.  We also discussed the option of a rolling walker which would provide her with greater stability and be less taxing than the cane.  She fatigues very quickly with exercise and needs several breaks between exercises and after each minute on the Nu-Step.  She reports her thigh muscles feel tired post treatment session.  Therapist closely monitoring response throughout treatment session.      Rehab Potential  Excellent    Clinical Impairments Affecting Rehab Potential  ORIF right humerus 11/23/2017; diffuse large  B cell lymphoma of lymph nodes of multiple regions 11/26/2017; peripheral neuropathy    PT Frequency  2x / week    PT Duration  8 weeks    PT Treatment/Interventions  Electrical Stimulation;Moist Heat;Cryotherapy;Gait training;Stair training;Therapeutic activities;Therapeutic exercise;Balance training;Patient/family education;Neuromuscular re-education;Manual techniques;Passive range of motion;Energy conservation    PT Next Visit Plan  work on bilateral ankle DF and stretch gastro; nustep; hip stretches; exercises seated initially     PT Home  Exercise Plan  Access Code: [email or web address]       Patient will benefit from skilled therapeutic intervention in order to improve the following deficits and impairments:  Abnormal gait, Increased fascial restricitons, Pain, Decreased coordination, Decreased mobility, Increased muscle spasms, Decreased activity tolerance, Decreased endurance, Decreased range of motion, Decreased strength, Difficulty walking, Impaired flexibility  Visit Diagnosis: Muscle weakness (generalized)  Other abnormalities of gait and mobility     Problem List Patient Active Problem List   Diagnosis Date Noted  . Physical deconditioning 04/10/2018  . Peripheral neuropathy due to chemotherapy ([place]) 03/29/2018  . Hypotension, unspecified 03/29/2018  . Anemia due to antineoplastic chemotherapy 01/23/2018  . Chronic respiratory failure with hypoxia ([place]) 12/30/2017  . Other constipation 12/29/2017  . Neutropenic fever ([place]) 12/18/2017  . Thrombocytopenia ([place]) 12/18/2017  . Abdominal fullness 12/18/2017  . Acute urinary retention 12/18/2017  . Cough in adult 12/15/2017  . Heart burn 12/15/2017  . Frequent headaches 12/15/2017  . Chemotherapy-induced nausea 12/12/2017  . Diffuse large B cell lymphoma ([place]) 11/29/2017  . Goals of care, counseling/discussion 11/29/2017  . Pathological fracture in neoplastic disease, right humerus, initial encounter for fracture 11/24/2017  . Hyperuricemia 11/22/2017  . Pathological fracture due to neoplastic disease, initial encounter 11/21/2017  . COPD GOLD II 07/05/2017  . COLONIC POLYPS, HYPERPLASTIC, HX OF 05/28/2010  . 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[place]) 11/27/2007  . Depression 05/05/2007   Ruben Im, PT 04/25/18 6:52 PM Phone: [phone] Fax: [phone]  Alvera Singh 04/25/2018, 6:52 PM  [facility] [address], [place], [place], [address] Phone: [phone]   Fax:   [phone]  Name: Brooke [place] MRN: [id number] Date of Birth: [birth year]

## 2018-04-25 NOTE — Patient Instructions (Addendum)
Fall Prevention and Home Safety Falls cause injuries and can affect all age groups. It is possible to use preventive measures to significantly decrease the likelihood of falls. There are many simple measures which can make your home safer and prevent falls. OUTDOORS  Repair cracks and edges of walkways and driveways.  Remove high doorway thresholds.  Trim shrubbery on the main path into your home.  Have good outside lighting.  Clear walkways of tools, rocks, debris, and clutter.  Check that handrails are not broken and are securely fastened. Both sides of steps should have handrails.  Have leaves, snow, and ice cleared regularly.  Use sand or salt on walkways during winter months.  In the garage, clean up grease or oil spills. BATHROOM  Install night lights.  Install grab bars by the toilet and in the tub and shower.  Use non-skid mats or decals in the tub or shower.  Place a plastic non-slip stool in the shower to sit on, if needed.  Keep floors dry and clean up all water on the floor immediately.  Remove soap buildup in the tub or shower on a regular basis.  Secure bath mats with non-slip, double-sided rug tape.  Remove throw rugs and tripping hazards from the floors. BEDROOMS  Install night lights.  Make sure a bedside light is easy to reach.  Do not use oversized bedding.  Keep a telephone by your bedside.  Have a firm chair with side arms to use for getting dressed.  Remove throw rugs and tripping hazards from the floor. KITCHEN  Keep handles on pots and pans turned toward the center of the stove. Use back burners when possible.  Clean up spills quickly and allow time for drying.  Avoid walking on wet floors.  Avoid hot utensils and knives.  Position shelves so they are not too high or low.  Place commonly used objects within easy reach.  If necessary, use a sturdy step stool with a grab bar when reaching.  Keep electrical cables out of the  way.  Do not use floor polish or wax that makes floors slippery. If you must use wax, use non-skid floor wax.  Remove throw rugs and tripping hazards from the floor. STAIRWAYS  Never leave objects on stairs.  Place handrails on both sides of stairways and use them. Fix any loose handrails. Make sure handrails on both sides of the stairways are as long as the stairs.  Check carpeting to make sure it is firmly attached along stairs. Make repairs to worn or loose carpet promptly.  Avoid placing throw rugs at the top or bottom of stairways, or properly secure the rug with carpet tape to prevent slippage. Get rid of throw rugs, if possible.  Have an electrician put in a light switch at the top and bottom of the stairs. OTHER FALL PREVENTION TIPS  Wear low-heel or rubber-soled shoes that are supportive and fit well. Wear closed toe shoes.  When using a stepladder, make sure it is fully opened and both spreaders are firmly locked. Do not climb a closed stepladder.  Add color or contrast paint or tape to grab bars and handrails in your home. Place contrasting color strips on first and last steps.  Learn and use mobility aids as needed. Install an electrical emergency response system.  Turn on lights to avoid dark areas. Replace light bulbs that burn out immediately. Get light switches that glow.  Arrange furniture to create clear pathways. Keep furniture in the same place.    Firmly attach carpet with non-skid or double-sided tape.  Eliminate uneven floor surfaces.  Select a carpet pattern that does not visually hide the edge of steps.  Be aware of all pets. OTHER HOME SAFETY TIPS  Set the water temperature for 120 F (48.8 C).  Keep emergency numbers on or near the telephone.  Keep smoke detectors on every level of the home and near sleeping areas. Document Released: 09/17/2002 Document Revised: 03/28/2012 Document Reviewed: 12/17/2011 [facility] Patient Information 2015  [place], [place]. This information is not intended to replace advice given to you by your health care provider. Make sure you discuss any questions you have with your health care provider.    Access Code: [email or web address]  URL: [email or web address]  Date: 04/25/2018  Prepared by: Ruben Im   Exercises  Seated Heel Raise - 10 reps - 1 sets - 1x daily - 7x weekly  Seated Long Arc Quad - 10 reps - 1 sets - 1x daily - 7x weekly  Seated Hip Adduction Squeeze with Ball - 10 reps - 1 sets - 1x daily - 7x weekly  Seated Hip Abduction - 10 reps - 1 sets - 1x daily - 7x weekly  Seated Leg Press with Resistance - 10 reps - 1 sets - 1x daily - 7x weekly      [facility] [address], [place], [place] [address] Phone # [phone] Fax [phone]

## 2018-04-27 ENCOUNTER — Ambulatory Visit: Payer: Medicare Other | Admitting: Physical Therapy

## 2018-04-27 DIAGNOSIS — M6281 Muscle weakness (generalized): Secondary | ICD-10-CM | POA: Diagnosis not present

## 2018-04-27 DIAGNOSIS — R2689 Other abnormalities of gait and mobility: Secondary | ICD-10-CM | POA: Diagnosis not present

## 2018-04-27 NOTE — Therapy (Signed)
[facility] [address] [place], [place], [address] Phone: [phone]   Fax:  [phone]  Physical Therapy Treatment  Patient Details  Name: Brooke Washington MRN: [id number] Date of Birth: [birth year] Referring Provider: Dr. Ivor Messier   Encounter Date: 04/27/2018  PT End of Session - 04/27/18 1042    Visit Number  3    Date for PT Re-Evaluation  06/14/18    Authorization Type  Medicare    PT Start Time  1324    PT Stop Time  1053    PT Time Calculation (min)  38 min    Activity Tolerance  Patient tolerated treatment well       Past Medical History:  Diagnosis Date  . Cancer ([place])  APRIL OF 2000   LOW-GRADE NON-HODGKIN'S LYMPHOMA  . COPD (chronic obstructive pulmonary disease) ([place])   . Emphysema of lung ([place])   . Hyperlipidemia     Past Surgical History:  Procedure Laterality Date  . 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 IR FLUORO GUIDE PORT INSERTION RIGHT  12/07/2017  . IR US GUIDE VASC ACCESS RIGHT  12/07/2017  . ORIF HUMERUS FRACTURE Right 11/23/2017   Procedure: OPEN REDUCTION INTERNAL FIXATION (ORIF) HUMERAL SHAFT FRACTURE;  Surgeon: Shona Needles, MD;  Location: [place];  Service: Orthopedics;  Laterality: Right;  . RIGHT OOPHORECTOMY  1976    There were no vitals filed for this visit.  Subjective Assessment - 04/27/18 1019    Subjective  Had some soreness in thighs but not too bad.  Using QC.  Back hurts only if standing too long.  I didn't get a chance to do my exercises yet.  I went out yesterday for my husbands birthday.      Currently in Pain?  No/denies    Multiple Pain Sites  No                       [facility] Adult PT Treatment/Exercise - 04/27/18 0001      Therapeutic Activites    Therapeutic Activities  ADL's    ADL's  sit to stand, walking, standing       Lumbar Exercises: Seated   Other  Seated Lumbar Exercises  QC push down to activate abdominals 10x    Other Seated Lumbar Exercises  1# Vs for core muscle activation       Knee/Hip Exercises: Stretches   Active Hamstring Stretch  Right;Left;3 reps seated      Knee/Hip Exercises: Aerobic   Nustep  L1 seat 7  6 min rest after every minute therapist closely monitoring response, 5 rest breaks      Knee/Hip Exercises: Seated   Long Arc Quad  AROM;Right;Left;10 reps    Stool Scoot - Round Trips  seated rocker board 20x    Ball Squeeze  10x    Clamshell with TheraBand  Yellow 10x3 double and single leg    Other Seated Knee/Hip Exercises  yellow band press out 10x right/left    Other Seated Knee/Hip Exercises  heel raises 10x    Marching  AROM;Right;Left;10 reps    Sit to Sand  5 reps very high mat table                PT Short Term Goals - 04/19/18 1427      PT SHORT TERM GOAL #1   Title  understand  tips to avoid falls    Time  4    Period  Weeks    Status  New    Target Date  05/17/18      PT SHORT TERM GOAL #2   Title  increased bilateral ankle dorsiflexion so she is able to walk with heel toe gait instead of shuffling    Time  4    Period  Weeks    Status  New    Target Date  05/17/18      PT SHORT TERM GOAL #3   Title  TUG score </= 15 seconds    Time  4    Period  Weeks    Status  New    Target Date  05/17/18      PT SHORT TERM GOAL #4   Title  ambulate for 5 mintues with minimal fatique for 400 feet    Time  4    Period  Weeks    Status  New    Target Date  05/17/18        PT Long Term Goals - 04/19/18 1429      PT LONG TERM GOAL #1   Title  independent with HEP and understand how to progress herself    Time  8    Period  Weeks    Status  New    Target Date  06/14/18      PT LONG TERM GOAL #2   Title  abilty to ambulate 6 minutes without a rest due to improved endurance and gait    Time  8    Period  Weeks    Status  New    Target Date  06/14/18      PT LONG TERM GOAL #3    Title  abilty to go up and down her steps with step over step pattern due to increased bilateral lower extremity strength >/= 4+/5    Time  8    Period  Weeks    Status  New    Target Date  06/14/18      PT LONG TERM GOAL #4   Title  right shoulder strength >/= 4+/5 so she is able to lift an object to the top shelf with greater ease    Time  8    Period  Weeks    Status  New    Target Date  06/14/18      PT LONG TERM GOAL #5   Title  TUG score </= 13 seconds to reduce her risk of falls in a public place    Time  8    Period  Weeks    Status  New    Target Date  06/14/18            Plan - 04/27/18 1044    Clinical Impression Statement  The patient continues to have bilateral LE weakness in quads and gluteals as well as poor ankle dorsiflexion associated with peripheral neuropathy.  She has difficulty with sit to stand and needs the table to be very high in order to rise without UE use.  She also has some LBP with sitting erect or pushing up to stand.  She fatigues quickly with even low level exercises.  Rest breaks every minute on Nu-Step.  Therapist closely monitoring response with all.        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; peripheral neuropathy    PT Frequency  2x / week    PT Duration  8 weeks    PT Treatment/Interventions  Electrical Stimulation;Moist Heat;Cryotherapy;Gait training;Stair training;Therapeutic activities;Therapeutic exercise;Balance training;Patient/family education;Neuromuscular re-education;Manual techniques;Passive range of motion;Energy conservation    PT Next Visit Plan  work on bilateral ankle DF and stretch gastro; nustep; hip stretches; exercises seated initially     PT Home Exercise Plan  Access Code: [email or web address]       Patient will benefit from skilled therapeutic intervention in order to improve the  following deficits and impairments:  Abnormal gait, Increased fascial restricitons, Pain, Decreased coordination, Decreased mobility, Increased muscle spasms, Decreased activity tolerance, Decreased endurance, Decreased range of motion, Decreased strength, Difficulty walking, Impaired flexibility  Visit Diagnosis: Muscle weakness (generalized)  Other abnormalities of gait and mobility     Problem List Patient Active Problem List   Diagnosis Date Noted  . Physical deconditioning 04/10/2018  . Peripheral neuropathy due to chemotherapy ([place]) 03/29/2018  . Hypotension, unspecified 03/29/2018  . Anemia due to antineoplastic chemotherapy 01/23/2018  . Chronic respiratory failure with hypoxia ([place]) 12/30/2017  . Other constipation 12/29/2017  . Neutropenic fever ([place]) 12/18/2017  . Thrombocytopenia ([place]) 12/18/2017  . Abdominal fullness 12/18/2017  . Acute urinary retention 12/18/2017  . Cough in adult 12/15/2017  . Heart burn 12/15/2017  . Frequent headaches 12/15/2017  . Chemotherapy-induced nausea 12/12/2017  . Diffuse large B cell lymphoma ([place]) 11/29/2017  . Goals of care, counseling/discussion 11/29/2017  . Pathological fracture in neoplastic disease, right humerus, initial encounter for fracture 11/24/2017  . Hyperuricemia 11/22/2017  . Pathological fracture due to neoplastic disease, initial encounter 11/21/2017  . COPD GOLD II 07/05/2017  . COLONIC POLYPS, HYPERPLASTIC, HX OF 05/28/2010  . 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 Depression 05/05/2007   Ruben Im, PT 04/27/18 10:57 AM Phone: [phone] Fax: [phone]  Alvera Singh 04/27/2018, 10:57 AM  [facility] [address] [place], [place], [address] Phone: [phone]   Fax:  [phone]  Name: Brooke Washington MRN: [id number] Date of Birth: Jan 11, 1941

## 2018-05-02 ENCOUNTER — Ambulatory Visit: Payer: Medicare Other | Admitting: Physical Therapy

## 2018-05-02 ENCOUNTER — Encounter: Payer: Self-pay | Admitting: Physical Therapy

## 2018-05-02 DIAGNOSIS — M6281 Muscle weakness (generalized): Secondary | ICD-10-CM

## 2018-05-02 DIAGNOSIS — R2689 Other abnormalities of gait and mobility: Secondary | ICD-10-CM | POA: Diagnosis not present

## 2018-05-02 NOTE — Therapy (Signed)
[facility] [address] [place], [place], [address] Phone: [phone]   Fax:  [phone]  Physical Therapy Treatment  Patient Details  Name: Brooke Washington MRN: [id number] Date of Birth: Apr 08, 1941 Referring Provider: Dr. Ivor Messier   Encounter Date: 05/02/2018  PT End of Session - 05/02/18 1042    Visit Number  4    Date for PT Re-Evaluation  06/14/18    Authorization Type  Medicare    PT Start Time  1016    PT Stop Time  1055    PT Time Calculation (min)  39 min    Activity Tolerance  Patient limited by fatigue       Past Medical History:  Diagnosis Date  . Cancer ([place])  APRIL OF 2000   LOW-GRADE NON-HODGKIN'S LYMPHOMA  . COPD (chronic obstructive pulmonary disease) ([place])   . Emphysema of lung ([place])   . Hyperlipidemia     Past Surgical History:  Procedure Laterality Date  . 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 IR FLUORO GUIDE PORT INSERTION RIGHT  12/07/2017  . IR US GUIDE VASC ACCESS RIGHT  12/07/2017  . ORIF HUMERUS FRACTURE Right 11/23/2017   Procedure: OPEN REDUCTION INTERNAL FIXATION (ORIF) HUMERAL SHAFT FRACTURE;  Surgeon: Shona Needles, MD;  Location: [place];  Service: Orthopedics;  Laterality: Right;  . RIGHT OOPHORECTOMY  1976    There were no vitals filed for this visit.  Subjective Assessment - 05/02/18 1024    Subjective  My front thighs were sore after last time but that's OK.      Patient Stated Goals  increase strength and improve balance    Currently in Pain?  No/denies                       [facility] Adult PT Treatment/Exercise - 05/02/18 0001      Therapeutic Activites    Therapeutic Activities  ADL's    ADL's  sit to stand, walking, standing       Lumbar Exercises: Seated   Other Seated Lumbar Exercises  QC push down to activate abdominals 10x      Knee/Hip Exercises: Aerobic    Nustep  L1 seat 7  6 min rest after every minute therapist closely monitoring response, 5 rest breaks      Knee/Hip Exercises: Standing   Heel Raises  Both;10 reps    Hip Abduction  AROM;Right;Left;2 sets;5 reps    Hip Extension  AROM;Right;Left;2 sets;5 reps    Other Standing Knee Exercises  step taps 10x    Other Standing Knee Exercises  weight shifting 10x side to side      Knee/Hip Exercises: Seated   Long Arc Quad  AROM;Right;Left;10 reps alternating    Stool Scoot - Round Trips  seated rocker board 20x    Ball Squeeze  20x    Clamshell with TheraBand  Yellow 10x3 double and single leg    Other Seated Knee/Hip Exercises  --    Other Seated Knee/Hip Exercises  --    Marching  AROM;Right;Left;10 reps               PT Short Term Goals - 05/02/18 1043      PT SHORT TERM GOAL #1   Title  understand tips to avoid falls    Time  4    Period  Weeks  Status  On-going      PT SHORT TERM GOAL #2   Title  increased bilateral ankle dorsiflexion so she is able to walk with heel toe gait instead of shuffling    Time  4    Period  Weeks    Status  On-going      PT SHORT TERM GOAL #3   Title  TUG score </= 15 seconds    Time  4    Period  Weeks    Status  On-going      PT SHORT TERM GOAL #4   Title  ambulate for 5 mintues with minimal fatique for 400 feet    Time  4    Period  Weeks    Status  On-going        PT Long Term Goals - 04/19/18 1429      PT LONG TERM GOAL #1   Title  independent with HEP and understand how to progress herself    Time  8    Period  Weeks    Status  New    Target Date  06/14/18      PT LONG TERM GOAL #2   Title  abilty to ambulate 6 minutes without a rest due to improved endurance and gait    Time  8    Period  Weeks    Status  New    Target Date  06/14/18      PT LONG TERM GOAL #3   Title  abilty to go up and down her steps with step over step pattern due to increased bilateral lower extremity strength >/= 4+/5    Time  8     Period  Weeks    Status  New    Target Date  06/14/18      PT LONG TERM GOAL #4   Title  right shoulder strength >/= 4+/5 so she is able to lift an object to the top shelf with greater ease    Time  8    Period  Weeks    Status  New    Target Date  06/14/18      PT LONG TERM GOAL #5   Title  TUG score </= 13 seconds to reduce her risk of falls in a public place    Time  8    Period  Weeks    Status  New    Target Date  06/14/18            Plan - 05/02/18 1044    Clinical Impression Statement  The patient continues to report quick fatigue particularly in quad muscles.  She continues to need rest breaks after every minute on the Nu-Step.  She is able to progress to low level standing exercises.  Close supervision provided for safety when walking and standing secondary to unsteadiness associated with peripheral neuropathy.  Therapist closely monitoring response throughout treatment session.      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; peripheral neuropathy    PT Frequency  2x / week    PT Duration  8 weeks    PT Treatment/Interventions  Electrical Stimulation;Moist Heat;Cryotherapy;Gait training;Stair training;Therapeutic activities;Therapeutic exercise;Balance training;Patient/family education;Neuromuscular re-education;Manual techniques;Passive range of motion;Energy conservation    PT Next Visit Plan  work on bilateral ankle DF and stretch gastro; nustep; hip stretches; exercises seated initially  PT Home Exercise Plan  Access Code: [email or web address]       Patient will benefit from skilled therapeutic intervention in order to improve the following deficits and impairments:  Abnormal gait, Increased fascial restricitons, Pain, Decreased coordination, Decreased mobility, Increased muscle spasms, Decreased activity tolerance, Decreased endurance,  Decreased range of motion, Decreased strength, Difficulty walking, Impaired flexibility  Visit Diagnosis: Muscle weakness (generalized)  Other abnormalities of gait and mobility     Problem List Patient Active Problem List   Diagnosis Date Noted  . Physical deconditioning 04/10/2018  . Peripheral neuropathy due to chemotherapy ([place]) 03/29/2018  . Hypotension, unspecified 03/29/2018  . Anemia due to antineoplastic chemotherapy 01/23/2018  . Chronic respiratory failure with hypoxia ([place]) 12/30/2017  . Other constipation 12/29/2017  . Neutropenic fever ([place]) 12/18/2017  . Thrombocytopenia ([place]) 12/18/2017  . Abdominal fullness 12/18/2017  . Acute urinary retention 12/18/2017  . Cough in adult 12/15/2017  . Heart burn 12/15/2017  . Frequent headaches 12/15/2017  . Chemotherapy-induced nausea 12/12/2017  . Diffuse large B cell lymphoma ([place]) 11/29/2017  . Goals of care, counseling/discussion 11/29/2017  . Pathological fracture in neoplastic disease, right humerus, initial encounter for fracture 11/24/2017  . Hyperuricemia 11/22/2017  . Pathological fracture due to neoplastic disease, initial encounter 11/21/2017  . COPD GOLD II 07/05/2017  . COLONIC POLYPS, HYPERPLASTIC, HX OF 05/28/2010  . 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 Depression 05/05/2007   Ruben Im, PT 05/02/18 11:02 AM Phone: [phone] Fax: [phone]  Alvera Singh 05/02/2018, 11:01 AM  [facility] [address], [place], [place], [address] Phone: [phone]   Fax:  [phone]  Name: Brooke Washington MRN: [id number] Date of Birth: 1941/01/03

## 2018-05-04 ENCOUNTER — Ambulatory Visit: Payer: Medicare Other | Admitting: Physical Therapy

## 2018-05-04 ENCOUNTER — Encounter: Payer: Self-pay | Admitting: Physical Therapy

## 2018-05-04 DIAGNOSIS — M6281 Muscle weakness (generalized): Secondary | ICD-10-CM | POA: Diagnosis not present

## 2018-05-04 DIAGNOSIS — R2689 Other abnormalities of gait and mobility: Secondary | ICD-10-CM

## 2018-05-04 NOTE — Therapy (Signed)
[facility] [address], [place], [place], [address] Phone: [phone]   Fax:  [phone]  Physical Therapy Treatment  Patient Details  Name: Brooke Washington MRN: [id number] Date of Birth: 08-18-1941 Referring Provider: Dr. Ivor Messier   Encounter Date: 05/04/2018  PT End of Session - 05/04/18 1033    Visit Number  5    Date for PT Re-Evaluation  06/14/18    Authorization Type  Medicare    PT Start Time  1016    PT Stop Time  1058    PT Time Calculation (min)  42 min    Activity Tolerance  Patient tolerated treatment well       Past Medical History:  Diagnosis Date  . Cancer ([place])  APRIL OF 2000   LOW-GRADE NON-HODGKIN'S LYMPHOMA  . COPD (chronic obstructive pulmonary disease) ([place])   . Emphysema of lung ([place])   . Hyperlipidemia     Past Surgical History:  Procedure Laterality Date  . 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 IR FLUORO GUIDE PORT INSERTION RIGHT  12/07/2017  . IR US GUIDE VASC ACCESS RIGHT  12/07/2017  . ORIF HUMERUS FRACTURE Right 11/23/2017   Procedure: OPEN REDUCTION INTERNAL FIXATION (ORIF) HUMERAL SHAFT FRACTURE;  Surgeon: Shona Needles, MD;  Location: [place];  Service: Orthopedics;  Laterality: Right;  . RIGHT OOPHORECTOMY  1976    There were no vitals filed for this visit.  Subjective Assessment - 05/04/18 1019    Subjective  If I'm up too long my back hurts.  I hate this quad cane but I don't want to use a rolling walker.  "I'll pass on that."  My gluteals were sore after last time.  Monday is my PET scan.      Patient Stated Goals  increase strength and improve balance    Currently in Pain?  No/denies    Multiple Pain Sites  No                       [facility] Adult PT Treatment/Exercise - 05/04/18 0001      Therapeutic Activites    Therapeutic Activities  ADL's    ADL's  sit to stand,  walking, standing       Lumbar Exercises: Seated   Other Seated Lumbar Exercises  QC push down to activate abdominals 10x      Knee/Hip Exercises: Stretches   Active Hamstring Stretch  Right;Left;3 reps seated    Other Knee/Hip Stretches  seated heel to opp knee 5x each     Other Knee/Hip Stretches  heel cord stretch against side of treadmill 1x each side      Knee/Hip Exercises: Aerobic   Nustep  L1 seat 7  6 min only 1 rest break today therapist closely monitoring response, 1st break after 3 min      Knee/Hip Exercises: Standing   Heel Raises  Both;10 reps    Hip Abduction  AROM;Right;Left;2 sets;5 reps    Hip Extension  AROM;Right;Left;2 sets;5 reps    SLS with Vectors  step and reach with UE movements forward and lateral 3x 5 each side    Gait Training  retro stepping 15x each side    Other Standing Knee Exercises  step taps 10x    Other Standing Knee Exercises  weight shifting 10x side to side      Knee/Hip Exercises: Seated  Long Arc Quad  AROM;Right;Left;10 reps with ball squeeze    Stool Scoot - Round Trips  seated rocker board 20x    Ball Squeeze  20x    Clamshell with TheraBand  Red single and double    Sit to [employer]  2 sets;5 reps;without UE support chair plus 2 foam cushions               PT Short Term Goals - 05/02/18 1043      PT SHORT TERM GOAL #1   Title  understand tips to avoid falls    Time  4    Period  Weeks    Status  On-going      PT SHORT TERM GOAL #2   Title  increased bilateral ankle dorsiflexion so she is able to walk with heel toe gait instead of shuffling    Time  4    Period  Weeks    Status  On-going      PT SHORT TERM GOAL #3   Title  TUG score </= 15 seconds    Time  4    Period  Weeks    Status  On-going      PT SHORT TERM GOAL #4   Title  ambulate for 5 mintues with minimal fatique for 400 feet    Time  4    Period  Weeks    Status  On-going        PT Long Term Goals - 04/19/18 1429      PT LONG TERM GOAL #1    Title  independent with HEP and understand how to progress herself    Time  8    Period  Weeks    Status  New    Target Date  06/14/18      PT LONG TERM GOAL #2   Title  abilty to ambulate 6 minutes without a rest due to improved endurance and gait    Time  8    Period  Weeks    Status  New    Target Date  06/14/18      PT LONG TERM GOAL #3   Title  abilty to go up and down her steps with step over step pattern due to increased bilateral lower extremity strength >/= 4+/5    Time  8    Period  Weeks    Status  New    Target Date  06/14/18      PT LONG TERM GOAL #4   Title  right shoulder strength >/= 4+/5 so she is able to lift an object to the top shelf with greater ease    Time  8    Period  Weeks    Status  New    Target Date  06/14/18      PT LONG TERM GOAL #5   Title  TUG score </= 13 seconds to reduce her risk of falls in a public place    Time  8    Period  Weeks    Status  New    Target Date  06/14/18            Plan - 05/04/18 1033    Clinical Impression Statement  The patient is improving with exercise endurance today with fewer rest breaks needed.  She is able to perform additional standing exercises with minimal LBP reported.  Therapist closely monitoring fatigue level, back pain and providing supervision for safety.  Continues to be at high  risk for falls secondary to peripheral neuropathy.  She is not receptive to using a rolling walker at this time.      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; peripheral neuropathy    PT Frequency  2x / week    PT Duration  8 weeks    PT Treatment/Interventions  Electrical Stimulation;Moist Heat;Cryotherapy;Gait training;Stair training;Therapeutic activities;Therapeutic exercise;Balance training;Patient/family education;Neuromuscular re-education;Manual techniques;Passive range of  motion;Energy conservation    PT Next Visit Plan  work on bilateral ankle DF and stretch gastro; nustep; hip stretches; exercises seated initially        Patient will benefit from skilled therapeutic intervention in order to improve the following deficits and impairments:  Abnormal gait, Increased fascial restricitons, Pain, Decreased coordination, Decreased mobility, Increased muscle spasms, Decreased activity tolerance, Decreased endurance, Decreased range of motion, Decreased strength, Difficulty walking, Impaired flexibility  Visit Diagnosis: Muscle weakness (generalized)  Other abnormalities of gait and mobility     Problem List Patient Active Problem List   Diagnosis Date Noted  . Physical deconditioning 04/10/2018  . Peripheral neuropathy due to chemotherapy ([place]) 03/29/2018  . Hypotension, unspecified 03/29/2018  . Anemia due to antineoplastic chemotherapy 01/23/2018  . Chronic respiratory failure with hypoxia ([place]) 12/30/2017  . Other constipation 12/29/2017  . Neutropenic fever ([place]) 12/18/2017  . Thrombocytopenia ([place]) 12/18/2017  . Abdominal fullness 12/18/2017  . Acute urinary retention 12/18/2017  . Cough in adult 12/15/2017  . Heart burn 12/15/2017  . Frequent headaches 12/15/2017  . Chemotherapy-induced nausea 12/12/2017  . Diffuse large B cell lymphoma ([place]) 11/29/2017  . Goals of care, counseling/discussion 11/29/2017  . Pathological fracture in neoplastic disease, right humerus, initial encounter for fracture 11/24/2017  . Hyperuricemia 11/22/2017  . Pathological fracture due to neoplastic disease, initial encounter 11/21/2017  . COPD GOLD II 07/05/2017  . COLONIC POLYPS, HYPERPLASTIC, HX OF 05/28/2010  . 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[place]) 11/27/2007  . Depression 05/05/2007   Ruben Im, PT 05/04/18 12:55 PM Phone: [phone] Fax: [phone]  Alvera Singh 05/04/2018, 12:55 PM  [facility] [address], [place], [place], [address] Phone: [phone]   Fax:  [phone]  Name: Brooke Washington MRN: [id number] Date of Birth: 1941-01-21

## 2018-05-08 ENCOUNTER — Ambulatory Visit (HOSPITAL_COMMUNITY)
Admission: RE | Admit: 2018-05-08 | Discharge: 2018-05-08 | Disposition: A | Discharge status: Home / Self Care | Payer: Medicare Other | Source: Ambulatory Visit | Attending: Hematology and Oncology | Admitting: Hematology and Oncology

## 2018-05-08 DIAGNOSIS — C8588 Other specified types of non-Hodgkin lymphoma, lymph nodes of multiple sites: Secondary | ICD-10-CM | POA: Diagnosis not present

## 2018-05-08 DIAGNOSIS — C8590 Non-Hodgkin lymphoma, unspecified, unspecified site: Secondary | ICD-10-CM | POA: Diagnosis not present

## 2018-05-08 LAB — GLUCOSE, CAPILLARY: Glucose-Capillary: 94 mg/dL (ref 70–99)

## 2018-05-08 MED ORDER — FLUDEOXYGLUCOSE F - 18 (FDG) INJECTION
5.6900 | Freq: Once | INTRAVENOUS | Status: AC | PRN
  Administered 2018-05-08: 5.69 via INTRAVENOUS

## 2018-05-09 ENCOUNTER — Encounter: Payer: Self-pay | Admitting: Physical Therapy

## 2018-05-09 ENCOUNTER — Ambulatory Visit: Payer: Medicare Other | Admitting: Physical Therapy

## 2018-05-09 DIAGNOSIS — R2689 Other abnormalities of gait and mobility: Secondary | ICD-10-CM | POA: Diagnosis not present

## 2018-05-09 DIAGNOSIS — M6281 Muscle weakness (generalized): Secondary | ICD-10-CM | POA: Diagnosis not present

## 2018-05-09 NOTE — Therapy (Signed)
[facility] [address], [place], [place], [address] Phone: [phone]   Fax:  [phone]  Physical Therapy Treatment  Patient Details  Name: Brooke Washington MRN: [id number] Date of Birth: 13-Jul-1941 Referring Provider: Dr. Ivor Messier   Encounter Date: 05/09/2018  PT End of Session - 05/09/18 1052    Visit Number  6    Date for PT Re-Evaluation  06/14/18    Authorization Type  Medicare    PT Start Time  2585    PT Stop Time  1053    PT Time Calculation (min)  38 min    Activity Tolerance  Patient tolerated treatment well       Past Medical History:  Diagnosis Date  . Cancer ([place])  APRIL OF 2000   LOW-GRADE NON-HODGKIN'S LYMPHOMA  . COPD (chronic obstructive pulmonary disease) ([place])   . Emphysema of lung ([place])   . Hyperlipidemia     Past Surgical History:  Procedure Laterality Date  . 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 IR FLUORO GUIDE PORT INSERTION RIGHT  12/07/2017  . IR US GUIDE VASC ACCESS RIGHT  12/07/2017  . ORIF HUMERUS FRACTURE Right 11/23/2017   Procedure: OPEN REDUCTION INTERNAL FIXATION (ORIF) HUMERAL SHAFT FRACTURE;  Surgeon: Shona Needles, MD;  Location: [facility];  Service: Orthopedics;  Laterality: Right;  . RIGHT OOPHORECTOMY  1976    There were no vitals filed for this visit.  Subjective Assessment - 05/09/18 1016    Subjective   I need to get my strength back in my legs.   My calf muscles are tight in the morning.  Patient did not bring her QC today, states "I hate that thing."      Currently in Pain?  No/denies    Multiple Pain Sites  No                       [facility] Adult PT Treatment/Exercise - 05/09/18 0001      Therapeutic Activites    Therapeutic Activities  ADL's    ADL's  sit to stand, walking, standing       Lumbar Exercises: Seated   Other Seated Lumbar Exercises  red band  straight arm pull downs 10x     Other Seated Lumbar Exercises  red band rows 10x      Knee/Hip Exercises: Stretches   Other Knee/Hip Stretches  heel cord stretch against side of treadmill 1x each side      Knee/Hip Exercises: Aerobic   Nustep  L1 seat 7  88min only 1 rest break today no rest break needed;  therapist monitoring      Knee/Hip Exercises: Standing   Heel Raises  Both;15 reps    Hip Abduction  AROM;Right;Left;10 reps    Hip Extension  AROM;Right;Left;10 reps    Lateral Step Up  Right;Left;2 sets;5 reps;Hand Hold: 2;Step Height: 2"    Forward Step Up  Right;Left;2 sets;5 reps;Hand Hold: 2;Step Height: 2"    SLS  3 ways hip 5x right/left     SLS with Vectors  step and reach with UE movements forward and lateral 3x 5 each side    Gait Training  retro stepping 15x each side    Other Standing Knee Exercises  step taps 10x    Other Standing Knee Exercises  10x 2 sidesteps      Knee/Hip Exercises: Seated  Long Arc [employer]  Strengthening;Right;Left;15 reps;Weights    Long Arc Quad Weight  1 lbs.    Stool Scoot - Round Trips  seated rocker board 20x    Clamshell with TheraBand  Red single and double    Other Seated Knee/Hip Exercises  march 5x 1# weight     Sit to Sand  10 reps;without UE support chair plus 2 foam cushions               PT Short Term Goals - 05/02/18 1043      PT SHORT TERM GOAL #1   Title  understand tips to avoid falls    Time  4    Period  Weeks    Status  On-going      PT SHORT TERM GOAL #2   Title  increased bilateral ankle dorsiflexion so she is able to walk with heel toe gait instead of shuffling    Time  4    Period  Weeks    Status  On-going      PT SHORT TERM GOAL #3   Title  TUG score </= 15 seconds    Time  4    Period  Weeks    Status  On-going      PT SHORT TERM GOAL #4   Title  ambulate for 5 mintues with minimal fatique for 400 feet    Time  4    Period  Weeks    Status  On-going        PT Long Term Goals - 04/19/18  1429      PT LONG TERM GOAL #1   Title  independent with HEP and understand how to progress herself    Time  8    Period  Weeks    Status  New    Target Date  06/14/18      PT LONG TERM GOAL #2   Title  abilty to ambulate 6 minutes without a rest due to improved endurance and gait    Time  8    Period  Weeks    Status  New    Target Date  06/14/18      PT LONG TERM GOAL #3   Title  abilty to go up and down her steps with step over step pattern due to increased bilateral lower extremity strength >/= 4+/5    Time  8    Period  Weeks    Status  New    Target Date  06/14/18      PT LONG TERM GOAL #4   Title  right shoulder strength >/= 4+/5 so she is able to lift an object to the top shelf with greater ease    Time  8    Period  Weeks    Status  New    Target Date  06/14/18      PT LONG TERM GOAL #5   Title  TUG score </= 13 seconds to reduce her risk of falls in a public place    Time  8    Period  Weeks    Status  New    Target Date  06/14/18            Plan - 05/09/18 1943    Clinical Impression Statement  The patient continues to progress with LE strengthening.  She is able to complete 7 min on the Nu-Step without a rest break today.  She is also able to stand for a  longer period of time and perform more difficult ex's.  Some mild back pain at the end of treatment session upon rising.  Therapist closely monitoring response throughout session.      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; peripheral neuropathy    PT Frequency  2x / week    PT Duration  8 weeks    PT Treatment/Interventions  Electrical Stimulation;Moist Heat;Cryotherapy;Gait training;Stair training;Therapeutic activities;Therapeutic exercise;Balance training;Patient/family education;Neuromuscular re-education;Manual techniques;Passive range of motion;Energy  conservation    PT Next Visit Plan  work on bilateral ankle DF and stretch gastro; nustep; hip stretches; add standing exs to HEP next visit;  recheck TUG;  Six min walk test    PT Home Exercise Plan  Access Code: [email or web address]       Patient will benefit from skilled therapeutic intervention in order to improve the following deficits and impairments:  Abnormal gait, Increased fascial restricitons, Pain, Decreased coordination, Decreased mobility, Increased muscle spasms, Decreased activity tolerance, Decreased endurance, Decreased range of motion, Decreased strength, Difficulty walking, Impaired flexibility  Visit Diagnosis: Muscle weakness (generalized)  Other abnormalities of gait and mobility     Problem List Patient Active Problem List   Diagnosis Date Noted  . Physical deconditioning 04/10/2018  . Peripheral neuropathy due to chemotherapy ([place]) 03/29/2018  . Hypotension, unspecified 03/29/2018  . Anemia due to antineoplastic chemotherapy 01/23/2018  . Chronic respiratory failure with hypoxia ([place]) 12/30/2017  . Other constipation 12/29/2017  . Neutropenic fever ([place]) 12/18/2017  . Thrombocytopenia ([place]) 12/18/2017  . Abdominal fullness 12/18/2017  . Acute urinary retention 12/18/2017  . Cough in adult 12/15/2017  . Heart burn 12/15/2017  . Frequent headaches 12/15/2017  . Chemotherapy-induced nausea 12/12/2017  . Diffuse large B cell lymphoma ([place]) 11/29/2017  . Goals of care, counseling/discussion 11/29/2017  . Pathological fracture in neoplastic disease, right humerus, initial encounter for fracture 11/24/2017  . Hyperuricemia 11/22/2017  . Pathological fracture due to neoplastic disease, initial encounter 11/21/2017  . COPD GOLD II 07/05/2017  . COLONIC POLYPS, HYPERPLASTIC, HX OF 05/28/2010  . 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[place]) 11/27/2007  . Depression 05/05/2007   Ruben Im, PT 05/09/18 7:50 PM Phone: [phone] Fax: [phone]  Alvera Singh 05/09/2018, 7:50 PM  [facility] [address], [place], [place], [address] Phone: [phone]   Fax:  [phone]  Name: Brooke Washington MRN: [id number] Date of Birth: 1941/02/15

## 2018-05-10 ENCOUNTER — Encounter: Payer: Self-pay | Admitting: Hematology and Oncology

## 2018-05-11 ENCOUNTER — Encounter: Payer: Self-pay | Admitting: Physical Therapy

## 2018-05-11 ENCOUNTER — Ambulatory Visit: Payer: Medicare Other | Attending: Hematology and Oncology | Admitting: Physical Therapy

## 2018-05-11 DIAGNOSIS — R2689 Other abnormalities of gait and mobility: Secondary | ICD-10-CM | POA: Insufficient documentation

## 2018-05-11 DIAGNOSIS — M6281 Muscle weakness (generalized): Secondary | ICD-10-CM | POA: Insufficient documentation

## 2018-05-11 NOTE — Therapy (Signed)
[facility] [address], [place], [place], [address] Phone: [phone]   Fax:  [phone]  Physical Therapy Treatment  Patient Details  Name: Brooke Washington MRN: [id number] Date of Birth: 09/15/1941 Referring Provider: Dr. Ivor Messier   Encounter Date: 05/11/2018  PT End of Session - 05/11/18 1021    Visit Number  7    Date for PT Re-Evaluation  06/14/18    Authorization Type  Medicare    PT Start Time  1016    PT Stop Time  1055    PT Time Calculation (min)  39 min    Activity Tolerance  Patient tolerated treatment well       Past Medical History:  Diagnosis Date  . Cancer ([place])  APRIL OF 2000   LOW-GRADE NON-HODGKIN'S LYMPHOMA  . COPD (chronic obstructive pulmonary disease) ([place])   . Emphysema of lung ([place])   . Hyperlipidemia     Past Surgical History:  Procedure Laterality Date  . 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 IR FLUORO GUIDE PORT INSERTION RIGHT  12/07/2017  . IR US GUIDE VASC ACCESS RIGHT  12/07/2017  . ORIF HUMERUS FRACTURE Right 11/23/2017   Procedure: OPEN REDUCTION INTERNAL FIXATION (ORIF) HUMERAL SHAFT FRACTURE;  Surgeon: Shona Needles, MD;  Location: [facility] [facility];  Service: Orthopedics;  Laterality: Right;  . RIGHT OOPHORECTOMY  1976    There were no vitals filed for this visit.  Subjective Assessment - 05/11/18 1019    Subjective  Did OK after last time.  A few sore muscles, different from the muscles originally.  This has helped so much I can get through the house faster now.      Patient Stated Goals  increase strength and improve balance    Currently in Pain?  No/denies    Multiple Pain Sites  No         [facility] PT Assessment - 05/11/18 0001      Timed Up and Go Test   Normal TUG (seconds)  16.2    TUG Comments  increased risk for falls                   [facility] Adult PT Treatment/Exercise -  05/11/18 0001      Therapeutic Activites    Therapeutic Activities  ADL's    ADL's  sit to stand, walking, standing       Knee/Hip Exercises: Aerobic   Nustep  L1 seat 7  66min only 1 rest break today no rest break needed;  therapist monitoring      Knee/Hip Exercises: Standing   Heel Raises  Both;15 reps    Hip Abduction  AROM;Right;Left;10 reps    Hip Extension  AROM;Right;Left;10 reps    Lateral Step Up  Right;Left;2 sets;5 reps;Hand Hold: 2;Step Height: 2"    Forward Step Up  Right;Left;2 sets;5 reps;Hand Hold: 2;Step Height: 2"    SLS  3 ways hip 5x right/left     SLS with Vectors  step and reach with UE movements forward and lateral 3x 5 each side    Gait Training  retro stepping 15x each side    Other Standing Knee Exercises  step taps 10x    Other Standing Knee Exercises  10x 2 sidesteps      Knee/Hip Exercises: Seated   Long Arc Quad  Strengthening;Right;Left;15 reps;Weights    Long Arc Quad [employer]  1 lbs.    Stool Scoot - Round Trips  seated rocker board 20x    Other Seated Knee/Hip Exercises  march 5x 1# weight     Sit to Sand  10 reps;without UE support chair plus 2 foam cushions               PT Short Term Goals - 05/11/18 1022      PT SHORT TERM GOAL #1   Title  understand tips to avoid falls    Status  Achieved      PT SHORT TERM GOAL #2   Title  increased bilateral ankle dorsiflexion so she is able to walk with heel toe gait instead of shuffling    Time  4    Period  Weeks    Status  On-going      PT SHORT TERM GOAL #3   Title  TUG score </= 15 seconds    Status  Achieved      PT SHORT TERM GOAL #4   Title  ambulate for 5 mintues with minimal fatique for 400 feet    Time  4    Period  Weeks    Status  On-going        PT Long Term Goals - 04/19/18 1429      PT LONG TERM GOAL #1   Title  independent with HEP and understand how to progress herself    Time  8    Period  Weeks    Status  New    Target Date  06/14/18      PT LONG TERM  GOAL #2   Title  abilty to ambulate 6 minutes without a rest due to improved endurance and gait    Time  8    Period  Weeks    Status  New    Target Date  06/14/18      PT LONG TERM GOAL #3   Title  abilty to go up and down her steps with step over step pattern due to increased bilateral lower extremity strength >/= 4+/5    Time  8    Period  Weeks    Status  New    Target Date  06/14/18      PT LONG TERM GOAL #4   Title  right shoulder strength >/= 4+/5 so she is able to lift an object to the top shelf with greater ease    Time  8    Period  Weeks    Status  New    Target Date  06/14/18      PT LONG TERM GOAL #5   Title  TUG score </= 13 seconds to reduce her risk of falls in a public place    Time  8    Period  Weeks    Status  New    Target Date  06/14/18            Plan - 05/11/18 1023    Clinical Impression Statement  The patient has much improved gait speed with Timed up and Go test but 3 seconds.  She is able to tolerate increased time on the Nu-Step and a progression to more standing exercises with fewer rest breaks.   Weakness associated with peripheral neuropathy continues to affect her balance putting her at risk for falls.  Therefore close supervision needed for safety.  Weakness in quads continues to make sit to stand difficult as well as stepping on /off the curb.  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; peripheral neuropathy    PT Frequency  2x / week    PT Duration  8 weeks    PT Treatment/Interventions  Electrical Stimulation;Moist Heat;Cryotherapy;Gait training;Stair training;Therapeutic activities;Therapeutic exercise;Balance training;Patient/family education;Neuromuscular re-education;Manual techniques;Passive range of motion;Energy conservation    PT Next Visit Plan  work on bilateral ankle DF and stretch  gastro; nustep; hip stretches; step ups;   Six min walk test    PT Home Exercise Plan  Access Code: [email or web address]       Patient will benefit from skilled therapeutic intervention in order to improve the following deficits and impairments:  Abnormal gait, Increased fascial restricitons, Pain, Decreased coordination, Decreased mobility, Increased muscle spasms, Decreased activity tolerance, Decreased endurance, Decreased range of motion, Decreased strength, Difficulty walking, Impaired flexibility  Visit Diagnosis: Muscle weakness (generalized)  Other abnormalities of gait and mobility     Problem List Patient Active Problem List   Diagnosis Date Noted  . Physical deconditioning 04/10/2018  . Peripheral neuropathy due to chemotherapy ([place]) 03/29/2018  . Hypotension, unspecified 03/29/2018  . Anemia due to antineoplastic chemotherapy 01/23/2018  . Chronic respiratory failure with hypoxia ([place]) 12/30/2017  . Other constipation 12/29/2017  . Neutropenic fever ([place]) 12/18/2017  . Thrombocytopenia ([place]) 12/18/2017  . Abdominal fullness 12/18/2017  . Acute urinary retention 12/18/2017  . Cough in adult 12/15/2017  . Heart burn 12/15/2017  . Frequent headaches 12/15/2017  . Chemotherapy-induced nausea 12/12/2017  . Diffuse large B cell lymphoma ([place]) 11/29/2017  . Goals of care, counseling/discussion 11/29/2017  . Pathological fracture in neoplastic disease, right humerus, initial encounter for fracture 11/24/2017  . Hyperuricemia 11/22/2017  . Pathological fracture due to neoplastic disease, initial encounter 11/21/2017  . COPD GOLD II 07/05/2017  . COLONIC POLYPS, HYPERPLASTIC, HX OF 05/28/2010  . 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 Depression 05/05/2007   Ruben Im, PT 05/11/18 6:53 PM Phone: [phone] Fax: [phone] Alvera Singh 05/11/2018, 6:53 PM  [facility] [address] [place], [place], [address] Phone: [phone]   Fax:  [phone]  Name: Brooke Washington MRN: [id number] Date of Birth: November 15, 1940

## 2018-05-13 IMAGING — RF DG FLUORO GUIDE NDL PLC/BX
2 series · 2 of 2 positions shown · non-contrast
Comparison: none

CLINICAL DATA: 77-year-old female with large-cell lymphoma.
Subsequent encounter.

[Series 1: run · 1 of 1 slices shown (1 of 2)]
[im 1/1]
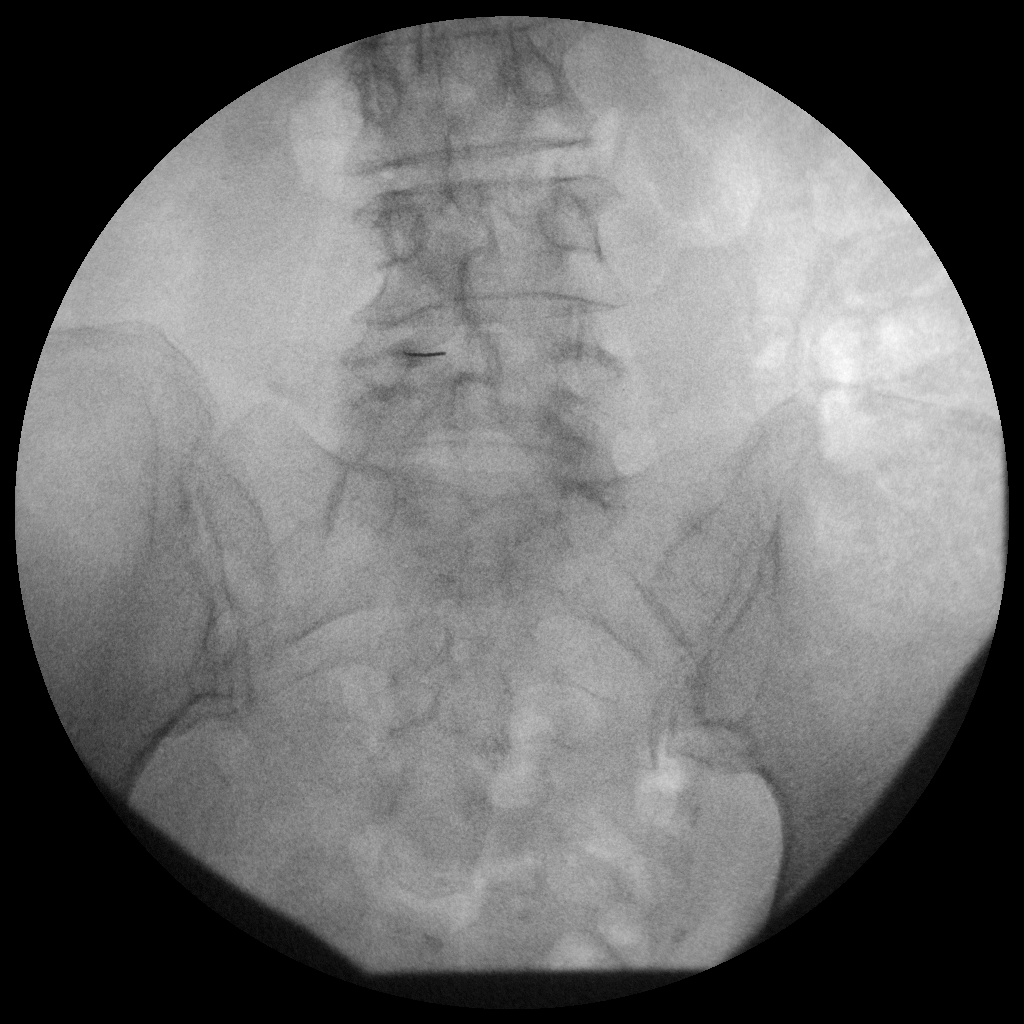

[Series 2: run · 1 of 1 slices shown (2 of 2)]
[im 1/1]
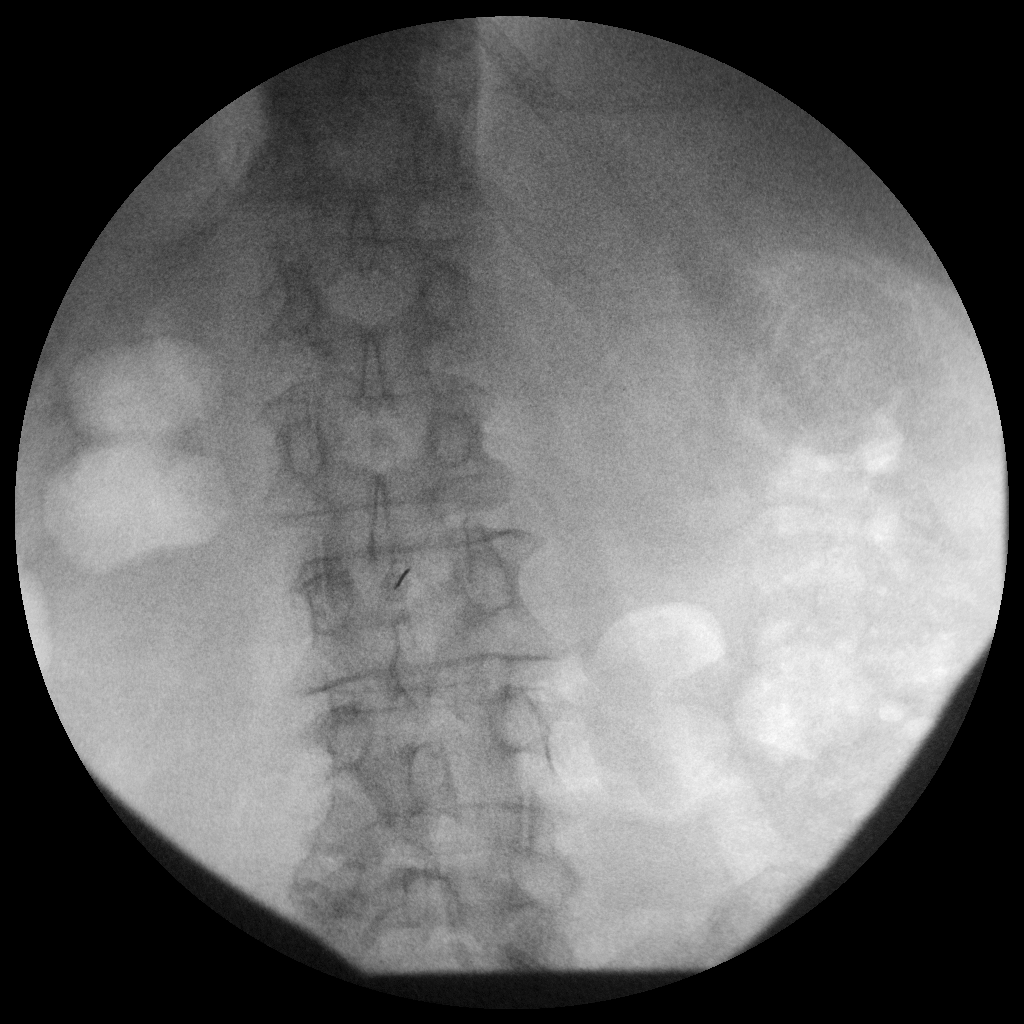

[2 of 2 positions shown; findings below may reference images not displayed]

EXAM:
INTRATHECAL INJECTION OF METHOTREXATE

FLUOROSCOPY TIME:  Fluoroscopy Time:  25 seconds.

Radiation Exposure Index: 6.64 mGy

PROCEDURE:
Procedure discussed with Dr. Mosquera. Prior images reviewed.
Procedure and associated risks reviewed with the patient. Questions
answered and written consent obtained. Time-out performed.

Patient prepped with alcohol after marking L4-5 and L2-3 level
(allergic to iodine). 1% lidocaine utilized as local anesthetic.
Under fluoroscopic guidance, L4-5 lumbar puncture was performed with
a single pass of a 22 gauge spinal needle. Initial clear cerebral
spinal fluid with 0.5 cc collected (Tube 1). Flow stopped and with
readjustment of needle, minimal blood noted with poor flow.

1% lidocaine utilized at the L2-3 level. L2-3 lumbar puncture
performed with single pass 22 gauge spinal needle. Cerebral spinal
fluid minimally blood tinged but without adequate flow. 1 cc
collected (tube 2) and sent for cytology. 12 mg of methotrexate
(prepared by pharmacy) instilled slowly.

No immediate complications.
IMPRESSION: Unsuccessful initial L4-5 LP. Successful L2-3 lumbar puncture with
installation of methotrexate.

## 2018-05-15 ENCOUNTER — Inpatient Hospital Stay: Payer: Medicare Other

## 2018-05-15 ENCOUNTER — Inpatient Hospital Stay: Payer: Medicare Other | Attending: Hematology and Oncology

## 2018-05-15 DIAGNOSIS — E790 Hyperuricemia without signs of inflammatory arthritis and tophaceous disease: Secondary | ICD-10-CM | POA: Diagnosis not present

## 2018-05-15 DIAGNOSIS — C8588 Other specified types of non-Hodgkin lymphoma, lymph nodes of multiple sites: Secondary | ICD-10-CM

## 2018-05-15 DIAGNOSIS — I959 Hypotension, unspecified: Secondary | ICD-10-CM | POA: Diagnosis not present

## 2018-05-15 DIAGNOSIS — D6481 Anemia due to antineoplastic chemotherapy: Secondary | ICD-10-CM | POA: Diagnosis not present

## 2018-05-15 DIAGNOSIS — C8338 Diffuse large B-cell lymphoma, lymph nodes of multiple sites: Secondary | ICD-10-CM | POA: Diagnosis not present

## 2018-05-15 LAB — CBC WITH DIFFERENTIAL (CANCER CENTER ONLY)
BASOS ABS: 0 10*3/uL (ref 0.0–0.1)
Basophils Relative: 1 %
EOS PCT: 9 %
Eosinophils Absolute: 0.5 10*3/uL (ref 0.0–0.5)
HEMATOCRIT: 33.7 % — AB (ref 34.8–46.6)
Hemoglobin: 11.4 g/dL — ABNORMAL LOW (ref 11.6–15.9)
LYMPHS ABS: 0.6 10*3/uL — AB (ref 0.9–3.3)
LYMPHS PCT: 10 %
MCH: 34.8 pg — AB (ref 25.1–34.0)
MCHC: 33.7 g/dL (ref 31.5–36.0)
MCV: 103.1 fL — AB (ref 79.5–101.0)
MONO ABS: 0.7 10*3/uL (ref 0.1–0.9)
MONOS PCT: 13 %
NEUTROS ABS: 4 10*3/uL (ref 1.5–6.5)
Neutrophils Relative %: 67 %
PLATELETS: 269 10*3/uL (ref 145–400)
RBC: 3.27 MIL/uL — ABNORMAL LOW (ref 3.70–5.45)
RDW: 16.2 % — AB (ref 11.2–14.5)
WBC Count: 5.8 10*3/uL (ref 3.9–10.3)

## 2018-05-15 LAB — CMP (CANCER CENTER ONLY)
ALBUMIN: 3.7 g/dL (ref 3.5–5.0)
ALT: 13 U/L (ref 0–44)
ANION GAP: 9 (ref 5–15)
AST: 21 U/L (ref 15–41)
Alkaline Phosphatase: 120 U/L (ref 38–126)
BILIRUBIN TOTAL: 0.5 mg/dL (ref 0.3–1.2)
BUN: 15 mg/dL (ref 8–23)
CHLORIDE: 106 mmol/L (ref 98–111)
CO2: 26 mmol/L (ref 22–32)
Calcium: 9.2 mg/dL (ref 8.9–10.3)
Creatinine: 0.73 mg/dL (ref 0.44–1.00)
GFR, Est AFR Am: >60 mL/min (ref >60)
GFR, Estimated: >60 mL/min (ref >60)
GLUCOSE: 78 mg/dL (ref 70–99)
POTASSIUM: 3.6 mmol/L (ref 3.5–5.1)
SODIUM: 141 mmol/L (ref 135–145)
Total Protein: 5.9 g/dL — ABNORMAL LOW (ref 6.5–8.1)

## 2018-05-15 LAB — URIC ACID: Uric Acid, Serum: 7.8 mg/dL — ABNORMAL HIGH (ref 2.5–7.1)

## 2018-05-15 MED ORDER — SODIUM CHLORIDE 0.9% FLUSH
10.0000 mL | Freq: Once | INTRAVENOUS | Status: AC
  Administered 2018-05-15: 10 mL
  Filled 2018-05-15: qty 10

## 2018-05-15 MED ORDER — HEPARIN SOD (PORK) LOCK FLUSH 100 UNIT/ML IV SOLN
500.0000 [IU] | Freq: Once | INTRAVENOUS | Status: AC
  Administered 2018-05-15: 500 [IU]
  Filled 2018-05-15: qty 5

## 2018-05-16 ENCOUNTER — Encounter: Payer: Self-pay | Admitting: Physical Therapy

## 2018-05-16 ENCOUNTER — Inpatient Hospital Stay (HOSPITAL_BASED_OUTPATIENT_CLINIC_OR_DEPARTMENT_OTHER): Payer: Medicare Other | Admitting: Hematology and Oncology

## 2018-05-16 ENCOUNTER — Ambulatory Visit: Payer: Medicare Other | Admitting: Physical Therapy

## 2018-05-16 ENCOUNTER — Encounter: Payer: Self-pay | Admitting: Hematology and Oncology

## 2018-05-16 VITALS — BP 118/62 | HR 95 | Temp 97.5°F | Resp 18 | Ht 64.0 in | Wt 118.7 lb

## 2018-05-16 DIAGNOSIS — M6281 Muscle weakness (generalized): Secondary | ICD-10-CM

## 2018-05-16 DIAGNOSIS — K2100 Gastro-esophageal reflux disease with esophagitis, without bleeding: Secondary | ICD-10-CM

## 2018-05-16 DIAGNOSIS — C8338 Diffuse large B-cell lymphoma, lymph nodes of multiple sites: Secondary | ICD-10-CM

## 2018-05-16 DIAGNOSIS — E790 Hyperuricemia without signs of inflammatory arthritis and tophaceous disease: Secondary | ICD-10-CM | POA: Diagnosis not present

## 2018-05-16 DIAGNOSIS — D6481 Anemia due to antineoplastic chemotherapy: Secondary | ICD-10-CM | POA: Diagnosis not present

## 2018-05-16 DIAGNOSIS — T451X5A Adverse effect of antineoplastic and immunosuppressive drugs, initial encounter: Secondary | ICD-10-CM

## 2018-05-16 DIAGNOSIS — R2689 Other abnormalities of gait and mobility: Secondary | ICD-10-CM

## 2018-05-16 DIAGNOSIS — I959 Hypotension, unspecified: Secondary | ICD-10-CM | POA: Diagnosis not present

## 2018-05-16 DIAGNOSIS — C8588 Other specified types of non-Hodgkin lymphoma, lymph nodes of multiple sites: Secondary | ICD-10-CM

## 2018-05-16 MED ORDER — ALLOPURINOL 300 MG PO TABS: 300.0000 mg | ORAL_TABLET | Freq: Every day | ORAL | 1 refills | Status: DC

## 2018-05-16 MED ORDER — PANTOPRAZOLE SODIUM 40 MG PO TBEC
40.0000 mg | DELAYED_RELEASE_TABLET | Freq: Every day | ORAL | 3 refills | Status: DC
Start: 2018-05-16 — End: 2019-04-14

## 2018-05-16 MED ORDER — LENALIDOMIDE 15 MG PO CAPS: ORAL_CAPSULE | ORAL | 11 refills | Status: DC

## 2018-05-16 NOTE — Assessment & Plan Note (Signed)
I have extensive discussion with the patient and her husband We review all imaging study which show complete response to treatment However, due to recurrent disease in an advanced level, stage IV at presentation, we did discuss the role of maintenance/consolidation treatment with Revlimid. Specifically, I shared with the patient and husband the data based on publication at [place].  Lenalidomide Maintenance Compared With Placebo in Responding Elderly Patients With Diffuse Large B-Cell Lymphoma Treated With First-Line Rituximab Plus Cyclophosphamide, Doxorubicin, Vincristine, and Prednisone Warden Fillers, Herve Marshall, [place], Rene-Olivier Clinton, Christophe Noel, [employer], Corinne Vermillion, [place], [place], Aurore Boaz, [place], Doyle, Sargeant, [place], [address] [place] Haggard M. Marianne Sofia, Reda [place], Lucie Oberic, [place] Corront, [place], [place] Lopez-Guillermo, John Clio, Achiel [place], Josette [place], [place], Gilles [place], Philippe [place], Venora Maples [place], and Bertrand Coiffier  Purpose The standard treatment of patients with diffuse large B-cell lymphoma (DLBCL) is rituximab in combination with cyclophosphamide, doxorubicin, vincristine, and prednisone (R-CHOP). Lenalidomide, an immunomodulatory agent, has shown activity in DLBCL. This randomized phase III trial compared lenalidomide as maintenance therapy with placebo in elderly patients with DLBCL who achieved a complete response (CR) or partial response (PR) to R-CHOP induction.  Methods Patients with previously untreated DLBCL or other aggressive B-cell lymphoma were 62 to 77 years old, had CR or PR after six or eight cycles of R-CHOP, and were randomly assigned to lenalidomide maintenance 25 mg/d or placebo for 21 days of every 28-day cycle for 24 months. The primary end point was progression-free survival  (PFS). Results A total of 650 patients were randomly assigned. At the time of the primary analysis (December 2015), with a median follow-up of 39 months from random assignment, median PFS was not reached for lenalidomide maintenance versus 58.9 months for placebo (hazard ratio, 0.708; 95% CI, 0.537 to 0.933; P = .01). The result was consistent among analyzed subgroups (eg, female v female, age-adjusted International Prognostic Index 0 or 1 v 2 or 3, age younger than 49 v $ 70 years), response (PR v CR) after R-CHOP, and positron emission tomography status at assignment (negative v positive). With longer median follow-up of 52 months (October 2016), overall survival was similar between arms (hazard ratio, 1.218; 95% CI, 0.861 to 1.721; P = .26). Most common grade 3 or 4 adverse events associated with lenalidomide versus placebo maintenance were neutropenia (56% v 22%) and cutaneous reactions (5% v 1%), respectively. Conclusion Lenalidomide maintenance for 24 months after obtaining a CR or PR to R-CHOP significantly prolonged PFS in elderly patients with DLBCL.  J Clin Oncol [phone].  2017 by American Society of Clinical Oncology  The risks, benefits, side effects of Revlimid including the risk of pancytopenia, infection, fatigue, diarrhea, skin reaction, etc. were discussed with the patient and husband and they agreed to proceed I will get insurance prior authorization to get treatment approval. She is reminded to take aspirin for DVT prophylaxis The patient is aware it will take 1 to 2 weeks to get medication approval/delivery She will need weekly blood draw in the first month after starting treatment and I will see her back within 2 weeks of treatment for toxicity review  We also reviewed the current guidelines She will need history and physical examination every 3 to 6 months for the first few years including CT imaging every 6 months for the first 2 years for surveillance

## 2018-05-16 NOTE — Assessment & Plan Note (Signed)
She has mild persistent anemia but improved compared to prior visit Due to history of severe pancytopenia, I plan to start Revlimid at slightly reduced dose

## 2018-05-16 NOTE — Assessment & Plan Note (Signed)
She has persistent hyperuricemia I recommend resumption of allopurinol and plan to recheck in the month

## 2018-05-16 NOTE — Progress Notes (Signed)
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 Specifically, I shared with the patient and husband the data based on publication at [employer].  Lenalidomide Maintenance Compared With Placebo in Responding Elderly Patients With Diffuse Large B-Cell Lymphoma Treated With First-Line Rituximab Plus Cyclophosphamide, Doxorubicin, Vincristine, and Prednisone Catherine Thie[place], Herve Tilly, Maria Gomes da Silva, Rene-Olivier [place], Christophe Fruchart, Franck Morschhauser, Corinne Haioun, Julien Lazarovici, Anida Grosicka, Aurore Perrot, Judith Trotman, Catherine Sebban, Dolores Caballero, Richard Greil, Koen van Eygen, Amos M. Cohen, Hugo Gonzalez, Reda [place], Lucie Oberic, Bernadette Corront, Bachra Choufi, Armando Lopez-Guillermo, John Catalano, Achiel Van Hoof, Josette Briere, Jose Cabead[place], Gilles Salles, Philippe Gaulard, Andre Bosly, and Bertrand Coiffier  Purpose The standard treatment of patients with diffuse large B-cell lymphoma (DLBCL) is rituximab in combination with cyclophosphamide, doxorubicin, vincristine, and prednisone (R-CHOP). Lenalidomide, an immunomodulatory agent, has shown activity in DLBCL. This randomized phase III trial compared lenalidomide as maintenance therapy with placebo in elderly patients with DLBCL who achieved a complete response (CR) or partial response (PR) to R-CHOP induction.  Methods Patients with previously untreated DLBCL or other aggressive B-cell lymphoma were 60 to 77 years old, had CR or PR after six or eight cycles of  R-CHOP, and were randomly assigned to lenalidomide maintenance 25 mg/d or placebo for 21 days of every 28-day cycle for 24 months. The primary end point was progression-free survival (PFS). Results A total of 650 patients were randomly assigned. At the time of the primary analysis (December 2015), with a median follow-up of 39 months from random assignment, median PFS was not reached for lenalidomide maintenance versus 58.9 months for placebo (hazard ratio, 0.708; 95% CI, 0.537 to 0.933; P = .01). The result was consistent among analyzed subgroups (eg, female v female, age-adjusted International Prognostic Index 0 or 1 v 2 or 3, age younger than 70 v $ 70 years), response (PR v CR) after R-CHOP, and positron emission tomography status at assignment (negative v positive). With longer median follow-up of 52 months (October 2016), overall survival was similar between arms (hazard ratio, 1.218; 95% CI, 0.861 to 1.721; P = .26). Most common grade 3 or 4 adverse events associated with lenalidomide versus placebo maintenance were neutropenia (56% v 22%) and cutaneous reactions (5% v 1%), respectively. Conclusion Lenalidomide maintenance for 24 months after obtaining a CR or PR to R-CHOP significantly prolonged PFS in elderly patients with DLBCL.  J Clin Oncol 35:[id number].  2017 by American Society of Clinical Oncology  The risks, benefits, side effects of Revlimid including the risk of pancytopenia, infection, fatigue, diarrhea, skin reaction, etc. were discussed with the patient and husband and they agreed to proceed I will get insurance prior authorization to get treatment approval. She is reminded to take aspirin for DVT prophylaxis The patient is aware it will take 1 to 2 weeks to get medication approval/delivery She will need weekly blood draw in the first month after starting treatment and I will see her back within 2 weeks of treatment for toxicity review  We also reviewed the current guidelines She  will need history and physical examination every 3 to 6   months for the first few years including CT imaging every 6 months for the first 2 years for surveillance  Anemia due to antineoplastic chemotherapy She has mild persistent anemia but improved compared to prior visit Due to history of severe pancytopenia, I plan to start Revlimid at slightly reduced dose  Hyperuricemia She has persistent hyperuricemia I recommend resumption of allopurinol and plan to recheck in the month   No orders of the defined types were placed in this encounter.   INTERVAL HISTORY: Please see below for problem oriented charting. 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 She was treated with chlorambucil in 2000.   --2007 - 09/2008.  Treated with single agent Rituxan.   --12/2008.  She had progressive adenopathy in chest and abdomen and repeat biopsy showed low grade follicular and diffuse B cell NHL.   --01/2009 - 07/2009. She required left ureteral stent and was treated with Treanda/Rituxan x 5 cycles from 02-2009 thru 06-2009.       11/20/2008 Imaging    Mildly displaced and angulated pathologic fracture of the proximal humeral shaft. 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       09/16/2014 Imaging    No lymphadenopathy in the chest, abdomen, or pelvis. The small right axillary lymph nodes seen on the previous exam  are unchanged in the 18 month interval. Small to moderate hiatal hernia.      11/22/2017 PET scan    New hypermetabolic lymphadenopathy within the chest and abdomen, consistent with recurrent lymphoma. New large hypermetabolic mass in the right hepatic lobe, consistent with lymphoma this involvement. New small hypermetabolic mass in the left kidney, which may be due to lymphomatous involvement although primary renal cell carcinoma cannot be excluded. Hypermetabolic mass in the right humerus with pathologic fracture, consistent with lymphomatous involvement. Small hypermetabolic foci in right posterior chest wall, also suspicious for recurrent lymphoma.      11/23/2017 Procedure    Bone, fragment(s), Right Humeral Shaft Reamings - ATYPICAL LYMPHOID INFILTRATE CONSISTENT WITH NON-HODGKIN'S B CELL LYMPHOMA. - SEE ONCOLOGY TABLE. Microscopic Comment LYMPHOMA Histologic type: Non-Hodgkins lymphoma, favor large cell type. Grade (if applicable): Favor high grade. Flow cytometry: No specimen is available for analysis. Immunohistochemical stains: BCL-2, BCL-6, CD3, CD5, CD10, CD15, CD20, CD30, CD34, CD43, LCA, CD79a, CD138, Ki-67, PAX-5 and TdT with appropriate controls as performed on block 1C. Touch preps/imprints: Not performed. Comments: The sections show multiple, primarily soft tissue fragments displaying a dense lymphoid infiltrate characterized by a mixture of small round to slightly irregular lymphocytes, histiocytes, and large centroblastic appearing lymphoid cells with partially clumped to vesicular chromatin, small nucleoli and amphophilic to clear cytoplasm. The number of admixed large lymphoid cells is variable but they appear relatively abundant in some areas associated with clustering. No material is available for flow cytometric analysis. Hence, a large batter of immunohistochemical stains were performed and show a mixture of T and B cells but with a significant B cell  component mostly  composed of large lymphoid cells with positivity for CD79a, PAX-5, CD30, BCL-2, and BCL-6. LCA shows diffuse staining although some of the large lymphoid cells appear negative. No significant staining is seen with CD20, CD10, CD15, CD34, TdT, or CD138. The lack of CD20 expression in this material is possibly related to prior treatment. There is variable increased expression of Ki-67 ranging from 10 to >50% in some areas particularly where there is abundance of large lymphoid cells. There is admixed abundant T cell population in the background as seen with CD3, CD5, and CD43 with on apparent co-expression of CD5 in B cell areas. The overall findings are consistent with involvement by non-Hodgkin's B cell lymphoma and the presence of relative abundance of large lymphoid B-cells and increased Ki-67 expression favor a high grade large B cell lymphoma.      11/23/2017 Surgery    1. CPT 24516-Intramedullary nailing of right humerus fracture 2. CPT 20245-Bone biopsy, deep        11/23/2017 - 11/25/2017 Hospital Admission    She was admitted to the hospital for management of humerus fracture and aggressive lymphoma      11/24/2017 Imaging    ECHO showed LV EF: 65% -   70%      11/25/2017 Procedure    Status post ultrasound-guided biopsy of liver mass. Tissue specimen sent to pathology for complete histopathologic analysis      12/07/2017 Imaging    Successful placement of a right internal jugular approach power injectable Port-A-Cath. The catheter is ready for immediate use.      12/09/2017 -  Chemotherapy    The patient had R-CHOP chemo        12/17/2017 - 12/20/2017 Hospital Admission    She was hospitalized for influenza infection      01/03/2018 Procedure    Unsuccessful initial L4-5 LP. Successful L2-3 lumbar puncture with installation of methotrexate.      01/24/2018 Procedure    Non complicated injection of intrathecal methotrexate, as above.      02/09/2018 Imaging    LV EF: 55% -   60%      02/10/2018 PET scan    1. Interval response to therapy. There has been resolution of previous hypermetabolic tumor within the chest and abdomen. Deauville criteria 2 and 3. 2. Diffuse radiotracer uptake is identified within the axial and appendicular skeleton which is favored to represent treatment related changes. 3. Significant decrease in FDG uptake associated with previously noted hypermetabolic tumor involving the proximal humeral diaphysis. The FDG uptake within this area on today's study is equivalent to that which is seen in the lumbar spine. 4. Aortic Atherosclerosis (ICD10-I70.0). Lad coronary artery calcifications. 5. Hiatal hernia      02/15/2018 Procedure    Fluoroscopic guided lumbar puncture and intrathecal injection of chemotherapy without complication.      03/31/2018 Procedure    Technically successful fluoroscopic guided lumbar puncture for intrathecal methotrexate injection. Procedure was well tolerated without evidence for immediate complications. Patient will recover in short stay prior to being discharged home      05/08/2018 PET scan    No residual/persistent enlarged or hypermetabolic lymph nodes in the neck, chest, abdomen or pelvis. Findings suggest an excellent response to treatment.       REVIEW OF SYSTEMS:   Constitutional: Denies fevers, chills or abnormal weight loss Eyes: Denies blurriness of vision Ears, nose, mouth, throat, and face: Denies mucositis or sore throat Respiratory: Denies cough, dyspnea or wheezes Cardiovascular: Denies  palpitation, chest discomfort or lower extremity swelling Gastrointestinal:  Denies nausea, heartburn or change in bowel habits Skin: Denies abnormal skin rashes Lymphatics: Denies new lymphadenopathy or easy bruising Neurological:Denies numbness, tingling or new weaknesses Behavioral/Psych: Mood is stable, no new changes  All other systems were reviewed with the patient and are negative.  I have reviewed the  past medical history, past surgical history, social history and family history with the patient and they are unchanged from previous note.  ALLERGIES:  is allergic to contrast media [iodinated diagnostic agents]; iohexol; povidone-iodine; penicillins; and adhesive [tape].  MEDICATIONS:  Current Outpatient Medications  Medication Sig Dispense Refill  . acetaminophen (TYLENOL) 325 MG tablet Take 2 tablets (650 mg total) by mouth every 6 (six) hours as needed for mild pain (or Fever >/= 101). (Patient not taking: Reported on 04/19/2018)    . allopurinol (ZYLOPRIM) 300 MG tablet Take 1 tablet (300 mg total) by mouth daily. 30 tablet 1  . aspirin 81 MG tablet Take 81 mg by mouth daily.    . citalopram (CELEXA) 40 MG tablet TAKE 1 TABLET BY MOUTH DAILY 90 tablet 1  . fluticasone furoate-vilanterol (BREO ELLIPTA) 100-25 MCG/INH AEPB Inhale 1 puff into the lungs every morning.    . lenalidomide (REVLIMID) 15 MG capsule Take 1 capsule daily for 21 days, rest 7 days for every 28 days cycle 21 capsule 11  . pantoprazole (PROTONIX) 40 MG tablet Take 1 tablet (40 mg total) by mouth daily. 180 tablet 3   No current facility-administered medications for this visit.     PHYSICAL EXAMINATION: ECOG PERFORMANCE STATUS: 1 - Symptomatic but completely ambulatory  Vitals:   05/16/18 1223  BP: 118/62  Pulse: 95  Resp: 18  Temp: (!) 97.5 F (36.4 C)  SpO2: 98%   Filed Weights   05/16/18 1223  Weight: 118 lb 11.2 oz (53.8 kg)    GENERAL:alert, no distress and comfortable SKIN: skin color, texture, turgor are normal, no rashes or significant lesions EYES: normal, Conjunctiva are pink and non-injected, sclera clear OROPHARYNX:no exudate, no erythema and lips, buccal mucosa, and tongue normal  NECK: supple, thyroid normal size, non-tender, without nodularity LYMPH:  no palpable lymphadenopathy in the cervical, axillary or inguinal LUNGS: clear to auscultation and percussion with normal breathing  effort HEART: regular rate & rhythm and no murmurs and no lower extremity edema ABDOMEN:abdomen soft, non-tender and normal bowel sounds Musculoskeletal:no cyanosis of digits and no clubbing  NEURO: alert & oriented x 3 with fluent speech, no focal motor/sensory deficits  LABORATORY DATA:  I have reviewed the data as listed    Component Value Date/Time   NA 141 05/15/2018 0853   NA 142 12/28/2016 1002   K 3.6 05/15/2018 0853   K 4.6 12/28/2016 1002   CL 106 05/15/2018 0853   CL 101 03/28/2013 0826   CO2 26 05/15/2018 0853   CO2 29 12/28/2016 1002   GLUCOSE 78 05/15/2018 0853   GLUCOSE 85 12/28/2016 1002   GLUCOSE 84 03/28/2013 0826   BUN 15 05/15/2018 0853   BUN 20.3 12/28/2016 1002   CREATININE 0.73 05/15/2018 0853   CREATININE 0.9 12/28/2016 1002   CALCIUM 9.2 05/15/2018 0853   CALCIUM 10.1 12/28/2016 1002   PROT 5.9 (L) 05/15/2018 0853   PROT 6.8 12/28/2016 1002   ALBUMIN 3.7 05/15/2018 0853   ALBUMIN 4.2 12/28/2016 1002   AST 21 05/15/2018 0853   AST 20 12/28/2016 1002   ALT 13 05/15/2018 0853   ALT 12   12/28/2016 1002   ALKPHOS 120 05/15/2018 0853   ALKPHOS 111 12/28/2016 1002   BILITOT 0.5 05/15/2018 0853   BILITOT 0.55 12/28/2016 1002   GFRNONAA >60 05/15/2018 0853   GFRAA >60 05/15/2018 0853    No results found for: SPEP, UPEP  Lab Results  Component Value Date   WBC 5.8 05/15/2018   NEUTROABS 4.0 05/15/2018   HGB 11.4 (L) 05/15/2018   HCT 33.7 (L) 05/15/2018   MCV 103.1 (H) 05/15/2018   PLT 269 05/15/2018      Chemistry      Component Value Date/Time   NA 141 05/15/2018 0853   NA 142 12/28/2016 1002   K 3.6 05/15/2018 0853   K 4.6 12/28/2016 1002   CL 106 05/15/2018 0853   CL 101 03/28/2013 0826   CO2 26 05/15/2018 0853   CO2 29 12/28/2016 1002   BUN 15 05/15/2018 0853   BUN 20.3 12/28/2016 1002   CREATININE 0.73 05/15/2018 0853   CREATININE 0.9 12/28/2016 1002      Component Value Date/Time   CALCIUM 9.2 05/15/2018 0853   CALCIUM 10.1  12/28/2016 1002   ALKPHOS 120 05/15/2018 0853   ALKPHOS 111 12/28/2016 1002   AST 21 05/15/2018 0853   AST 20 12/28/2016 1002   ALT 13 05/15/2018 0853   ALT 12 12/28/2016 1002   BILITOT 0.5 05/15/2018 0853   BILITOT 0.55 12/28/2016 1002       RADIOGRAPHIC STUDIES: I have reviewed multiple imaging study with the patient and husband I have personally reviewed the radiological images as listed and agreed with the findings in the report. Nm Pet Image Restag (ps) Skull Base To Thigh  Result Date: 05/08/2018 CLINICAL DATA:  Subsequent treatment strategy for lymphoma. EXAM: NUCLEAR MEDICINE PET SKULL BASE TO THIGH TECHNIQUE: 5.69 mCi F-18 FDG was injected intravenously. Full-ring PET imaging was performed from the skull base to thigh after the radiotracer. CT data was obtained and used for attenuation correction and anatomic localization. Fasting blood glucose: 94 mg/dl COMPARISON:  Multiple prior PET CTs.  The most recent is 02/10/2018 FINDINGS: Mediastinal blood pool activity: SUV max 2.75 NECK: No hypermetabolic lymph nodes in the neck. Incidental CT findings: Advanced atherosclerotic calcifications involving the carotid arteries. CHEST: No hypermetabolic mediastinal or hilar nodes. No suspicious pulmonary nodules on the CT scan. Incidental CT findings: Stable emphysematous changes and areas of pulmonary scarring. Stable advanced atherosclerotic calcifications involving the thoracic aorta and coronary arteries. Moderate-sized hiatal hernia. ABDOMEN/PELVIS: No abnormal hypermetabolic activity within the liver, pancreas, adrenal glands, or spleen. No hypermetabolic lymph nodes in the abdomen or pelvis. No enlarged or hypermetabolic inguinal lymph nodes. Incidental CT findings: Stable advanced atherosclerotic calcifications involving the aorta and iliac arteries. SKELETON: No focal hypermetabolic activity to suggest skeletal metastasis. Incidental CT findings: Right humeral rod in place. Stable sclerotic  lesion in the right ischial tuberosity and also in region of the right greater trochanter. IMPRESSION: No residual/persistent enlarged or hypermetabolic lymph nodes in the neck, chest, abdomen or pelvis. Findings suggest an excellent response to treatment. Electronically Signed   By: P.  Gallerani M.D.   On: 05/08/2018 13:05    All questions were answered. The patient knows to call the clinic with any problems, questions or concerns. No barriers to learning was detected.  I spent 40 minutes counseling the patient face to face. The total time spent in the appointment was 55 minutes and more than 50% was on counseling and review of test results   , MD   05/16/2018 3:59 PM  

## 2018-05-16 NOTE — Therapy (Signed)
[facility] [address], [place], [place], [address] Phone: [phone]   Fax:  [phone]  Physical Therapy Treatment  Patient Details  Name: Brooke Washington MRN: [id number] Date of Birth: 07-Jan-1941 Referring Provider: Dr. Ivor Messier   Encounter Date: 05/16/2018  PT End of Session - 05/16/18 1051    Visit Number  8    Date for PT Re-Evaluation  06/14/18    Authorization Type  Medicare    PT Start Time  7341    PT Stop Time  1054    PT Time Calculation (min)  39 min    Activity Tolerance  Patient tolerated treatment well       Past Medical History:  Diagnosis Date  . Cancer ([place])  APRIL OF 2000   LOW-GRADE NON-HODGKIN'S LYMPHOMA  . COPD (chronic obstructive pulmonary disease) ([place])   . Emphysema of lung ([place])   . Hyperlipidemia     Past Surgical History:  Procedure Laterality Date  . 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 IR FLUORO GUIDE PORT INSERTION RIGHT  12/07/2017  . IR US GUIDE VASC ACCESS RIGHT  12/07/2017  . ORIF HUMERUS FRACTURE Right 11/23/2017   Procedure: OPEN REDUCTION INTERNAL FIXATION (ORIF) HUMERAL SHAFT FRACTURE;  Surgeon: Shona Needles, MD;  Location: [place];  Service: Orthopedics;  Laterality: Right;  . RIGHT OOPHORECTOMY  1976    There were no vitals filed for this visit.  Subjective Assessment - 05/16/18 1019    Subjective  I'm stiff this morning.  Continued soreness after therapy but not too bad.  I see my oncologist today and get the results of my PET scan.      Currently in Pain?  No/denies    Multiple Pain Sites  No                       [facility] Adult PT Treatment/Exercise - 05/16/18 0001      Therapeutic Activites    Therapeutic Activities  ADL's    ADL's  sit to stand, walking, standing       Knee/Hip Exercises: Stretches   Other Knee/Hip Stretches  hip flexor stretch on 2nd step   5x each side    Other Knee/Hip Stretches  heel cord stretch in incline 10 sec hold 3x bil       Knee/Hip Exercises: Aerobic   Nustep  L1 seat 7  10:30 min only 1 rest break today no rest break needed;  therapist monitoring      Knee/Hip Exercises: Standing   Heel Raises  Both;10 reps    Hip Abduction  AROM;Right;Left;10 reps    Forward Step Up  Right;Left;5 reps;Hand Hold: 2;Step Height: 6"    SLS  3 ways hip 5x right/left     SLS with Vectors  step and reach with UE movements forward and lateral 3x 5 each side    Other Standing Knee Exercises  step taps 10x    Other Standing Knee Exercises  10x 2 sidesteps      Knee/Hip Exercises: Seated   Long Arc Quad  Strengthening;Right;Left;10 reps    Long Arc Quad Weight  2 lbs.    Stool Scoot - Round Trips  seated rocker board 20x    Other Seated Knee/Hip Exercises  red band rows and pull downs 10x each     Other Seated Knee/Hip Exercises  march 10x  2 # weight     Sit to Sand  10 reps;without UE support chair plus 1 foam cushions               PT Short Term Goals - 05/11/18 1022      PT SHORT TERM GOAL #1   Title  understand tips to avoid falls    Status  Achieved      PT SHORT TERM GOAL #2   Title  increased bilateral ankle dorsiflexion so she is able to walk with heel toe gait instead of shuffling    Time  4    Period  Weeks    Status  On-going      PT SHORT TERM GOAL #3   Title  TUG score </= 15 seconds    Status  Achieved      PT SHORT TERM GOAL #4   Title  ambulate for 5 mintues with minimal fatique for 400 feet    Time  4    Period  Weeks    Status  On-going        PT Long Term Goals - 04/19/18 1429      PT LONG TERM GOAL #1   Title  independent with HEP and understand how to progress herself    Time  8    Period  Weeks    Status  New    Target Date  06/14/18      PT LONG TERM GOAL #2   Title  abilty to ambulate 6 minutes without a rest due to improved endurance and gait    Time  8    Period  Weeks     Status  New    Target Date  06/14/18      PT LONG TERM GOAL #3   Title  abilty to go up and down her steps with step over step pattern due to increased bilateral lower extremity strength >/= 4+/5    Time  8    Period  Weeks    Status  New    Target Date  06/14/18      PT LONG TERM GOAL #4   Title  right shoulder strength >/= 4+/5 so she is able to lift an object to the top shelf with greater ease    Time  8    Period  Weeks    Status  New    Target Date  06/14/18      PT LONG TERM GOAL #5   Title  TUG score </= 13 seconds to reduce her risk of falls in a public place    Time  8    Period  Weeks    Status  New    Target Date  06/14/18            Plan - 05/16/18 1051    Clinical Impression Statement  The patient continues to have decreased gastroc muscle lengths bilaterally associated with her peripheral neuropathy.   Encouraged frequent stretching at home sitting on the edge of the bed and with toes propped on the wall.  She reports LE muscular fatigue especially in quad muscles but fewer rest breaks needed.  Continues to be at high risk for falls secondary to motor and sensory loss associated with peripheral neuropathy.  Close supervision for safety and to monitor fatigue level.      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; peripheral neuropathy    PT Frequency  2x / week    PT Duration  8 weeks    PT Treatment/Interventions  Electrical Stimulation;Moist Heat;Cryotherapy;Gait training;Stair training;Therapeutic activities;Therapeutic exercise;Balance training;Patient/family education;Neuromuscular re-education;Manual techniques;Passive range of motion;Energy conservation    PT Next Visit Plan  work on bilateral ankle DF and stretch gastro; nustep; hip stretches; step ups;   Five min walk test and TUG for STG next visit    PT Home  Exercise Plan  Access Code: [email or web address]       Patient will benefit from skilled therapeutic intervention in order to improve the following deficits and impairments:  Abnormal gait, Increased fascial restricitons, Pain, Decreased coordination, Decreased mobility, Increased muscle spasms, Decreased activity tolerance, Decreased endurance, Decreased range of motion, Decreased strength, Difficulty walking, Impaired flexibility  Visit Diagnosis: Muscle weakness (generalized)  Other abnormalities of gait and mobility     Problem List Patient Active Problem List   Diagnosis Date Noted  . Physical deconditioning 04/10/2018  . Peripheral neuropathy due to chemotherapy ([place]) 03/29/2018  . Hypotension, unspecified 03/29/2018  . Anemia due to antineoplastic chemotherapy 01/23/2018  . Chronic respiratory failure with hypoxia ([place]) 12/30/2017  . Other constipation 12/29/2017  . Neutropenic fever ([place]) 12/18/2017  . Thrombocytopenia ([place]) 12/18/2017  . Abdominal fullness 12/18/2017  . Acute urinary retention 12/18/2017  . Cough in adult 12/15/2017  . Heart burn 12/15/2017  . Frequent headaches 12/15/2017  . Chemotherapy-induced nausea 12/12/2017  . Diffuse large B cell lymphoma ([place]) 11/29/2017  . Goals of care, counseling/discussion 11/29/2017  . Pathological fracture in neoplastic disease, right humerus, initial encounter for fracture 11/24/2017  . Hyperuricemia 11/22/2017  . Pathological fracture due to neoplastic disease, initial encounter 11/21/2017  . COPD GOLD II 07/05/2017  . COLONIC POLYPS, HYPERPLASTIC, HX OF 05/28/2010  . 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 Depression 05/05/2007   Ruben Im, PT 05/16/18 2:27 PM Phone: [phone] Fax: [phone]  Alvera Singh 05/16/2018, 2:27 PM  [facility] [address] [place], [place], [address] Phone: [phone]   Fax:  [phone]  Name:  Brooke Washington MRN: [id number] Date of Birth: [birth year]

## 2018-05-18 ENCOUNTER — Encounter: Payer: Self-pay | Admitting: Physical Therapy

## 2018-05-18 ENCOUNTER — Telehealth: Payer: Self-pay | Admitting: Pharmacist

## 2018-05-18 ENCOUNTER — Ambulatory Visit: Payer: Medicare Other | Admitting: Physical Therapy

## 2018-05-18 DIAGNOSIS — R2689 Other abnormalities of gait and mobility: Secondary | ICD-10-CM

## 2018-05-18 DIAGNOSIS — M6281 Muscle weakness (generalized): Secondary | ICD-10-CM | POA: Diagnosis not present

## 2018-05-18 DIAGNOSIS — C8338 Diffuse large B-cell lymphoma, lymph nodes of multiple sites: Secondary | ICD-10-CM

## 2018-05-18 MED ORDER — LENALIDOMIDE 15 MG PO CAPS: ORAL_CAPSULE | ORAL | 0 refills | Status: DC

## 2018-05-18 NOTE — Telephone Encounter (Signed)
Oral Oncology Pharmacist Encounter  Received new prescription for Revlimid (lenalidomide) for the maintenance treatment of diffuse large B-cell lymphoma, planned duration until disease progression or unacceptable toxicity. Patient completed R-[facility] chemotherapy on 03/29/2018  Labs from [place] assessed, [place] for treatment. MD monitoring elevated uric acid closely  Current medication list in Epic reviewed, no DDIs with Revlimid identified.  Prescription will be e-scribed to appropriate specialty Pharmacy once insurance authorization is obtained as Revlimid is a limited distribution medication.  Oral Oncology Clinic will continue to follow for insurance authorization, copayment issues, initial counseling and start date.  Johny Drilling, PharmD, BCPS, BCOP  05/18/2018 11:17 AM Oral Oncology Clinic [phone]

## 2018-05-18 NOTE — Telephone Encounter (Signed)
Oral Oncology Pharmacist Encounter  Insurance authorization for Revlimid 15mg  capsules has been approved by [employer] Ref key: [id number] Effective dates: 05/18/18-05/17/21  Johny Drilling, PharmD, BCPS, BCOP  05/18/2018 12:25 PM Oral Oncology Clinic [phone]

## 2018-05-18 NOTE — Therapy (Signed)
[facility] [address], [place], [place], [address] Phone: [phone]   Fax:  [phone]  Physical Therapy Treatment  Patient Details  Name: Brooke Washington MRN: [id number] Date of Birth: [birth year] Referring Provider: Dr. Ivor Messier   Encounter Date: 05/18/2018  PT End of Session - 05/18/18 1023    Visit Number  9    Date for PT Re-Evaluation  06/14/18    Authorization Type  Medicare    PT Start Time  6333    PT Stop Time  1055    PT Time Calculation (min)  40 min    Activity Tolerance  Patient tolerated treatment well       Past Medical History:  Diagnosis Date  . Cancer ([place])  APRIL OF 2000   LOW-GRADE NON-HODGKIN'S LYMPHOMA  . COPD (chronic obstructive pulmonary disease) ([place])   . Emphysema of lung ([place])   . Hyperlipidemia     Past Surgical History:  Procedure Laterality Date  . 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 IR FLUORO GUIDE PORT INSERTION RIGHT  12/07/2017  . IR US GUIDE VASC ACCESS RIGHT  12/07/2017  . ORIF HUMERUS FRACTURE Right 11/23/2017   Procedure: OPEN REDUCTION INTERNAL FIXATION (ORIF) HUMERAL SHAFT FRACTURE;  Surgeon: Shona Needles, MD;  Location: [place];  Service: Orthopedics;  Laterality: Right;  . RIGHT OOPHORECTOMY  1976    There were no vitals filed for this visit.  Subjective Assessment - 05/18/18 1016    Subjective  This is really helping my legs be stronger and helping my balance.  I can get up out of my chair at home easier and faster.   I went to the oncologist and I don't have to do any more radiation.  I just have to take a pill.      Patient Stated Goals  increase strength and improve balance    Currently in Pain?  No/denies         [facility] PT Assessment - 05/18/18 0001      6 minute walk test results    Aerobic Endurance Distance Walked  520    Endurance additional comments  rest break at  2:30                   [facility] Adult PT Treatment/Exercise - 05/18/18 0001      Therapeutic Activites    Therapeutic Activities  ADL's    ADL's  sit to stand, walking, standing       Knee/Hip Exercises: Stretches   Other Knee/Hip Stretches  hip flexor stretch on 2nd step  5x each side    Other Knee/Hip Stretches  heel cord stretch in incline 10 sec hold 3x bil       Knee/Hip Exercises: Aerobic   Nustep  L1 10 min Seat 7       Knee/Hip Exercises: Standing   Forward Step Up  Right;Left;10 reps;Hand Hold: 2;Step Height: 6"    Other Standing Knee Exercises  alternating 1st and 2nd step taps 10x    Other Standing Knee Exercises  1/2 foam roll rocking 1 min       Knee/Hip Exercises: Seated   Long Arc Quad  Strengthening;Right;Left;15 reps    Long Arc Quad Weight  2 lbs.    Ball Squeeze  25x    Other Seated Knee/Hip Exercises  march 10x 2 # weight  Sit to Sand  10 reps;without UE support               PT Short Term Goals - 05/18/18 1032      PT SHORT TERM GOAL #1   Title  understand tips to avoid falls    Status  Achieved      PT SHORT TERM GOAL #2   Title  increased bilateral ankle dorsiflexion so she is able to walk with heel toe gait instead of shuffling    Status  Achieved      PT SHORT TERM GOAL #3   Title  TUG score </= 15 seconds    Status  Achieved      PT SHORT TERM GOAL #4   Title  ambulate for 5 mintues with minimal fatique for 400 feet    Status  Achieved        PT Long Term Goals - 04/19/18 1429      PT LONG TERM GOAL #1   Title  independent with HEP and understand how to progress herself    Time  8    Period  Weeks    Status  New    Target Date  06/14/18      PT LONG TERM GOAL #2   Title  abilty to ambulate 6 minutes without a rest due to improved endurance and gait    Time  8    Period  Weeks    Status  New    Target Date  06/14/18      PT LONG TERM GOAL #3   Title  abilty to go up and down her steps with step over step  pattern due to increased bilateral lower extremity strength >/= 4+/5    Time  8    Period  Weeks    Status  New    Target Date  06/14/18      PT LONG TERM GOAL #4   Title  right shoulder strength >/= 4+/5 so she is able to lift an object to the top shelf with greater ease    Time  8    Period  Weeks    Status  New    Target Date  06/14/18      PT LONG TERM GOAL #5   Title  TUG score </= 13 seconds to reduce her risk of falls in a public place    Time  8    Period  Weeks    Status  New    Target Date  06/14/18            Plan - 05/18/18 1257    Clinical Impression Statement  The patient has made significant improvements in LE strength and endurance.  She is able ambulate 5 minutes (with 1 seated rest break) > 500 feet.  She has met all STGs.   She is able to increase repetition number with low resistance while seated and standing but does fatigue quickly especially in quad muscles.  She continues to struggle to rise from sit to stand without significant use of UE assist.  Therapist closely monitoring for excessive fatigue and for safety secondary to balance impairments associated with peripheral neuropathy.      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; peripheral neuropathy    PT Frequency  2x / week    PT Duration  8 weeks    PT Treatment/Interventions  Scientist, product/process development;Therapeutic activities;Therapeutic exercise;Balance training;Patient/family education;Neuromuscular re-education;Manual techniques;Passive range of motion;Energy conservation    PT Next Visit Plan  try leg press;  Rocker board;  work on bilateral ankle DF and stretch gastro; nustep; hip stretches; step ups;  sit to stand TUG for STG next visit 10th visit note next visit   PT Home Exercise Plan  Access Code: [email or web address]        Patient will benefit from skilled therapeutic intervention in order to improve the following deficits and impairments:  Abnormal gait, Increased fascial restricitons, Pain, Decreased coordination, Decreased mobility, Increased muscle spasms, Decreased activity tolerance, Decreased endurance, Decreased range of motion, Decreased strength, Difficulty walking, Impaired flexibility  Visit Diagnosis: Muscle weakness (generalized)  Other abnormalities of gait and mobility     Problem List Patient Active Problem List   Diagnosis Date Noted  . Physical deconditioning 04/10/2018  . Peripheral neuropathy due to chemotherapy ([place]) 03/29/2018  . Hypotension, unspecified 03/29/2018  . Anemia due to antineoplastic chemotherapy 01/23/2018  . Chronic respiratory failure with hypoxia ([place]) 12/30/2017  . Other constipation 12/29/2017  . Neutropenic fever ([place]) 12/18/2017  . Thrombocytopenia ([place]) 12/18/2017  . Abdominal fullness 12/18/2017  . Acute urinary retention 12/18/2017  . Cough in adult 12/15/2017  . Heart burn 12/15/2017  . Frequent headaches 12/15/2017  . Chemotherapy-induced nausea 12/12/2017  . Diffuse large B cell lymphoma ([place]) 11/29/2017  . Goals of care, counseling/discussion 11/29/2017  . Pathological fracture in neoplastic disease, right humerus, initial encounter for fracture 11/24/2017  . Hyperuricemia 11/22/2017  . Pathological fracture due to neoplastic disease, initial encounter 11/21/2017  . COPD GOLD II 07/05/2017  . COLONIC POLYPS, HYPERPLASTIC, HX OF 05/28/2010  . 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 Depression 05/05/2007   Ruben Im, PT 05/18/18 1:09 PM Phone: [phone] Fax: [phone] Alvera Singh 05/18/2018, 1:08 PM  [facility] [address] [place], [place], [address] Phone: [phone]   Fax:  [phone]  Name: Brooke Washington MRN: [id number] Date  of Birth: 11-05-1940

## 2018-05-18 NOTE — Telephone Encounter (Signed)
Oral Chemotherapy Pharmacist Encounter   I spoke with patient's hudband for overview of: Revlimid.   Counseled on administration, dosing, side effects, monitoring, drug-food interactions, safe handling, storage, and disposal.  Patient will take Revlimid 15mg  capsules, 1 capsule by mouth once daily, without regard to food, with a full glass of water.  Revlimid will be given 21 days on, 7 days off, repeat every 28 days.  Revlimid start date: TBD, week of 05/22/18  Adverse effects of Revlimid include but are not limited to: nausea, constipation, diarrhea, abdominal pain, rash, fatigue, drug fever, and decreased blood counts.    Reviewed importance of keeping a medication schedule and plan for any missed doses.  Medication reconciliation performed and medication/allergy list updated.  Mr. Ballinas informed insurance authorization has been approved. Revlimid prescription has been e-scribed to [facility] [phone]) Mr. Kral instructed to call dispensing pharmacy on Monday 8/12 if he had not yet heard from them to schedule delivery of 1st fill of Revlimid  Mr. Brundage informed of possibility of high copay. We extensively discussed the copayment foundation process and manufacturer compassionate use process for medication acquisition if needed.  Patient's husband informed that he will be hearing from [facility] to schedule follow-up visits now that approximate start date of Revliimd is known.  All questions answered.  Mr. Jordahl voiced understanding and appreciation.   Mr. Mathison knows to call the office with questions or concerns. Oral Oncology Clinic will continue to follow.  Johny Drilling, PharmD, BCPS, BCOP  05/18/2018    12:27 PM Oral Oncology Clinic [phone]

## 2018-05-19 ENCOUNTER — Telehealth: Payer: Self-pay | Admitting: Pharmacist

## 2018-05-19 DIAGNOSIS — C8338 Diffuse large B-cell lymphoma, lymph nodes of multiple sites: Secondary | ICD-10-CM

## 2018-05-19 NOTE — Telephone Encounter (Signed)
Oral Oncology Pharmacist Encounter  Received notification that patient has high copayment for Revlimid (~$2,400) There are no grant foundations available at this time for patient's diagnosis.  Application to [employer] assistance program in an effort to obtain Revlimid at $0 out of pocket expense to patient through manufacturer compassionate use program submitted online.  I LVM for patient that application has been submitted.  This encounter will continue to be updated until final determination.  Johny Drilling, PharmD, BCPS, BCOP  05/19/2018 11:25 AM Oral Oncology Clinic [phone]

## 2018-05-23 ENCOUNTER — Encounter: Payer: Self-pay | Admitting: Physical Therapy

## 2018-05-23 ENCOUNTER — Ambulatory Visit: Payer: Medicare Other | Admitting: Physical Therapy

## 2018-05-23 DIAGNOSIS — M6281 Muscle weakness (generalized): Secondary | ICD-10-CM | POA: Diagnosis not present

## 2018-05-23 DIAGNOSIS — R2689 Other abnormalities of gait and mobility: Secondary | ICD-10-CM | POA: Diagnosis not present

## 2018-05-23 MED ORDER — LENALIDOMIDE 15 MG PO CAPS: ORAL_CAPSULE | ORAL | 0 refills | Status: DC

## 2018-05-23 NOTE — Therapy (Signed)
[facility] [address] [place], [place], [address] Phone: [phone]   Fax:  [phone]  Physical Therapy Treatment  Patient Details  Name: Brooke Washington MRN: [id number] Date of Birth: 1940/12/04 Referring Provider: Dr. Ivor Messier  Progress Note Reporting Period 04/19/18 to 05/23/18 See note below for Objective Data and Assessment of Progress/Goals.       Encounter Date: 05/23/2018  PT End of Session - 05/23/18 1043    Visit Number  10    Date for PT Re-Evaluation  06/14/18    Authorization Type  Medicare    PT Start Time  1017    PT Stop Time  1058    PT Time Calculation (min)  41 min    Activity Tolerance  Patient tolerated treatment well       Past Medical History:  Diagnosis Date  . Cancer ([place])  APRIL OF 2000   LOW-GRADE NON-HODGKIN'S LYMPHOMA  . COPD (chronic obstructive pulmonary disease) ([place])   . Emphysema of lung ([place])   . Hyperlipidemia     Past Surgical History:  Procedure Laterality Date  . 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 IR FLUORO GUIDE PORT INSERTION RIGHT  12/07/2017  . IR US GUIDE VASC ACCESS RIGHT  12/07/2017  . ORIF HUMERUS FRACTURE Right 11/23/2017   Procedure: OPEN REDUCTION INTERNAL FIXATION (ORIF) HUMERAL SHAFT FRACTURE;  Surgeon: Shona Needles, MD;  Location: [facility];  Service: Orthopedics;  Laterality: Right;  . RIGHT OOPHORECTOMY  1976    There were no vitals filed for this visit.  Subjective Assessment - 05/23/18 1018    Subjective  Pretty good weekend.  Stayed home and played cards with her daughter.  My feet still are tingling which throws off my balance.    Patient Stated Goals  increase strength and improve balance    Currently in Pain?  No/denies    Multiple Pain Sites  No         [facility] PT Assessment - 05/23/18 0001      Strength   Right Shoulder Flexion  4/5    Right  Shoulder Extension  4/5    Right Shoulder ABduction  4/5    Right Shoulder Internal Rotation  4/5    Right Shoulder External Rotation  4/5    Right Elbow Extension  4-/5    Left Elbow Extension  4-/5    Right Hip Flexion  4/5    Right Hip Extension  4/5    Right Hip ABduction  3+/5    Left Hip Flexion  4/5    Left Hip Extension  4/5    Left Hip ABduction  3+/5    Right Knee Flexion  4/5    Right Knee Extension  4/5    Left Knee Flexion  3+/5    Left Knee Extension  4/5    Right Ankle Dorsiflexion  4-/5    Right Ankle Plantar Flexion  3-/5    Right Ankle Eversion  4-/5    Left Ankle Dorsiflexion  4-/5    Left Ankle Plantar Flexion  3-/5    Left Ankle Eversion  4-/5      Timed Up and Go Test   Normal TUG (seconds)  15    TUG Comments  increased risk for falls                   [facility]  Adult PT Treatment/Exercise - 05/23/18 0001      Therapeutic Activites    Therapeutic Activities  ADL's    ADL's  sit to stand, walking, standing       Knee/Hip Exercises: Stretches   Other Knee/Hip Stretches  hip flexor stretch on 2nd step  5x each side    Other Knee/Hip Stretches  heel cord stretch in incline 10 sec hold 3x bil       Knee/Hip Exercises: Aerobic   Nustep  L2 8 min Seat 7    no breaks needed     Knee/Hip Exercises: Machines for Strengthening   Cybex Leg Press  40# both 15x; 30# single leg 10x each      Knee/Hip Exercises: Standing   Heel Raises  Both;10 reps    Forward Step Up  Right;Left;10 reps;Hand Hold: 2;Step Height: 6"    Other Standing Knee Exercises  1/2 foam roll rocking 1 min       Knee/Hip Exercises: Seated   Long Arc Quad  Strengthening;Right;Left;15 reps    Long Arc Quad Weight  2 lbs.    Clamshell with TheraBand  Green   single and double 10x each   Other Seated Knee/Hip Exercises  rocker board 3 min     Other Seated Knee/Hip Exercises  marching with green band around thighs 10x    Sit to Sand  --   chair plus 1 foam cushions               PT Short Term Goals - 05/18/18 1032      PT SHORT TERM GOAL #1   Title  understand tips to avoid falls    Status  Achieved      PT SHORT TERM GOAL #2   Title  increased bilateral ankle dorsiflexion so she is able to walk with heel toe gait instead of shuffling    Status  Achieved      PT SHORT TERM GOAL #3   Title  TUG score </= 15 seconds    Status  Achieved      PT SHORT TERM GOAL #4   Title  ambulate for 5 mintues with minimal fatique for 400 feet    Status  Achieved        PT Long Term Goals - 04/19/18 1429      PT LONG TERM GOAL #1   Title  independent with HEP and understand how to progress herself    Time  8    Period  Weeks    Status  New    Target Date  06/14/18      PT LONG TERM GOAL #2   Title  abilty to ambulate 6 minutes without a rest due to improved endurance and gait    Time  8    Period  Weeks    Status  New    Target Date  06/14/18      PT LONG TERM GOAL #3   Title  abilty to go up and down her steps with step over step pattern due to increased bilateral lower extremity strength >/= 4+/5    Time  8    Period  Weeks    Status  New    Target Date  06/14/18      PT LONG TERM GOAL #4   Title  right shoulder strength >/= 4+/5 so she is able to lift an object to the top shelf with greater ease    Time  8  Period  Weeks    Status  New    Target Date  06/14/18      PT LONG TERM GOAL #5   Title  TUG score </= 13 seconds to reduce her risk of falls in a public place    Time  8    Period  Weeks    Status  New    Target Date  06/14/18            Plan - 05/23/18 1427    Clinical Impression Statement  The patient continues to improve with LE strength and endurance although she continues to have difficulty rising from a standard height chair without significant use of UEs to rise and control descent.  Her ongoing neuropathy in her feet continues to affect her balance making her at higher risk for falls.  Her Timed up and Go  time continues to improve indicating improved strength and overall mobility.  She would benefit from continued PT for further strengthening, balance and endurance ex.      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; peripheral neuropathy    PT Frequency  2x / week    PT Duration  8 weeks    PT Treatment/Interventions  Electrical Stimulation;Moist Heat;Cryotherapy;Gait training;Stair training;Therapeutic activities;Therapeutic exercise;Balance training;Patient/family education;Neuromuscular re-education;Manual techniques;Passive range of motion;Energy conservation    PT Next Visit Plan  continue leg press;  work on bilateral ankle DF and stretch gastro; nustep; hip stretches; step ups, sit to stand    PT Home Exercise Plan  Access Code: [email or web address]       Patient will benefit from skilled therapeutic intervention in order to improve the following deficits and impairments:  Abnormal gait, Increased fascial restricitons, Pain, Decreased coordination, Decreased mobility, Increased muscle spasms, Decreased activity tolerance, Decreased endurance, Decreased range of motion, Decreased strength, Difficulty walking, Impaired flexibility  Visit Diagnosis: Muscle weakness (generalized)  Other abnormalities of gait and mobility     Problem List Patient Active Problem List   Diagnosis Date Noted  . Physical deconditioning 04/10/2018  . Peripheral neuropathy due to chemotherapy ([place]) 03/29/2018  . Hypotension, unspecified 03/29/2018  . Anemia due to antineoplastic chemotherapy 01/23/2018  . Chronic respiratory failure with hypoxia ([place]) 12/30/2017  . Other constipation 12/29/2017  . Neutropenic fever ([place]) 12/18/2017  . Thrombocytopenia ([place]) 12/18/2017  . Abdominal fullness 12/18/2017  . Acute urinary retention 12/18/2017  . Cough in adult 12/15/2017  . Heart  burn 12/15/2017  . Frequent headaches 12/15/2017  . Chemotherapy-induced nausea 12/12/2017  . Diffuse large B cell lymphoma ([place]) 11/29/2017  . Goals of care, counseling/discussion 11/29/2017  . Pathological fracture in neoplastic disease, right humerus, initial encounter for fracture 11/24/2017  . Hyperuricemia 11/22/2017  . Pathological fracture due to neoplastic disease, initial encounter 11/21/2017  . COPD GOLD II 07/05/2017  . COLONIC POLYPS, HYPERPLASTIC, HX OF 05/28/2010  . 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[place]) 11/27/2007  . Depression 05/05/2007   Ruben Im, PT 05/23/18 2:32 PM Phone: [phone] Fax: [phone]  Alvera Singh 05/23/2018, 2:31 PM  [facility] [address] [place], [place], [address] Phone: [phone]   Fax:  [phone]  Name: YINA RIVIERE MRN: [id number] Date of Birth: 1941/08/12

## 2018-05-23 NOTE — Telephone Encounter (Signed)
Oral Oncology Pharmacist Encounter  Received notification from [facility] patient assistance program that patient has been successfully enrolled in manufacturer compassionate use program to receive Revlimid at $0 out-of-pocket cost to patient until 10/10/2018.  Revlimid will be dispensed from [facility] pharmacy by [facility] out of [place]. Revlimid prescription has been E scribed to dispensing pharmacy.  I called and spoke with patient's husband to update him about good news and provide information about dispensing pharmacy.  Patient's husband understands that we will need to find continued Revlimid copayment support in the new calendar year.  Patient's husband instructed to discuss refill process with dispensing pharmacy when they call to set up for shipment of the Revlimid. Patient's husband instructed to call [facility] pharmacy on Friday (05/26/2018) to follow-up on first shipment of Revlimid if he has not yet heard from them to schedule for shipment.  All questions answered. Mr. Randhawa expressed appreciation and understanding.  They know to call the office with any additional questions or concerns.  Johny Drilling, PharmD, BCPS, BCOP  05/23/2018 1:00 PM Oral Oncology Clinic [phone]

## 2018-05-24 ENCOUNTER — Telehealth: Payer: Self-pay | Admitting: Hematology and Oncology

## 2018-05-24 ENCOUNTER — Encounter: Payer: Self-pay | Admitting: Hematology and Oncology

## 2018-05-24 NOTE — Telephone Encounter (Signed)
Spoke to patient regarding upcoming appts per 8/8 sch message

## 2018-05-25 ENCOUNTER — Encounter: Payer: Self-pay | Admitting: Physical Therapy

## 2018-05-25 ENCOUNTER — Ambulatory Visit: Payer: Medicare Other | Admitting: Physical Therapy

## 2018-05-25 DIAGNOSIS — M6281 Muscle weakness (generalized): Secondary | ICD-10-CM | POA: Diagnosis not present

## 2018-05-25 DIAGNOSIS — R2689 Other abnormalities of gait and mobility: Secondary | ICD-10-CM | POA: Diagnosis not present

## 2018-05-25 NOTE — Telephone Encounter (Signed)
Oral Oncology Patient Advocate Encounter  I confirmed with [facility] that the Revlimid was delivered and signed for 05/24/18.   [facility] [place] Phone [phone] Fax [phone]

## 2018-05-25 NOTE — Therapy (Signed)
[facility] [address], [place], [place], [address] Phone: [phone]   Fax:  [phone]  Physical Therapy Treatment  Patient Details  Name: Brooke Washington MRN: [id number] Date of Birth: [birth year] Referring Provider: Dr. Ivor Messier   Encounter Date: 05/25/2018  PT End of Session - 05/25/18 1042    Visit Number  11    Date for PT Re-Evaluation  06/14/18    Authorization Type  Medicare    PT Start Time  1017    PT Stop Time  1055    PT Time Calculation (min)  38 min    Activity Tolerance  Patient tolerated treatment well       Past Medical History:  Diagnosis Date  . Cancer ([place])  APRIL OF 2000   LOW-GRADE NON-HODGKIN'S LYMPHOMA  . COPD (chronic obstructive pulmonary disease) ([place])   . Emphysema of lung ([place])   . Hyperlipidemia     Past Surgical History:  Procedure Laterality Date  . 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 IR FLUORO GUIDE PORT INSERTION RIGHT  12/07/2017  . IR US GUIDE VASC ACCESS RIGHT  12/07/2017  . ORIF HUMERUS FRACTURE Right 11/23/2017   Procedure: OPEN REDUCTION INTERNAL FIXATION (ORIF) HUMERAL SHAFT FRACTURE;  Surgeon: Shona Needles, MD;  Location: [facility];  Service: Orthopedics;  Laterality: Right;  . RIGHT OOPHORECTOMY  1976    There were no vitals filed for this visit.  Subjective Assessment - 05/25/18 1020    Subjective  Started a new chemo pill.  My toes are tingly.  I think very slowly my feet are getting better.  Mild muscle soreness following last visit.      Currently in Pain?  No/denies    Multiple Pain Sites  No                       [facility] Adult PT Treatment/Exercise - 05/25/18 0001      Therapeutic Activites    Therapeutic Activities  ADL's    ADL's  sit to stand, walking, standing       Knee/Hip Exercises: Stretches   Other Knee/Hip Stretches  hip flexor stretch on 2nd  step  5x each side    Other Knee/Hip Stretches  heel cord stretch in incline 10 sec hold 3x bil       Knee/Hip Exercises: Aerobic   Nustep  L2 5 min Seat 7    no breaks needed     Knee/Hip Exercises: Machines for Strengthening   Cybex Knee Flexion  2x10 15#     Cybex Leg Press  seat 7 45# both 15x;  single leg 30# 15x each       Knee/Hip Exercises: Standing   Heel Raises  Both;10 reps    Forward Step Up  Right;Left;10 reps;Hand Hold: 2;Step Height: 6"    SLS  3 ways hip 6x right/left     Other Standing Knee Exercises  1/2 foam roll rocking 1 min       Knee/Hip Exercises: Seated   Long Arc Quad  Strengthening;Right;Left;10 reps    Long Arc Quad Weight  3 lbs.    Ball Squeeze  25x    Clamshell with Marga Hoots   single and double 10x each     Shoulder Exercises: Seated   Elevation  Strengthening;Both;10 reps    Elevation Weight (lbs)  1  Flexion  Strengthening;Both;10 reps;Weights    Flexion Weight (lbs)  1    Abduction  Strengthening;Both;10 reps    ABduction Weight (lbs)  1    Other Seated Exercises  UBE 2 min forward only               PT Short Term Goals - 05/18/18 1032      PT SHORT TERM GOAL #1   Title  understand tips to avoid falls    Status  Achieved      PT SHORT TERM GOAL #2   Title  increased bilateral ankle dorsiflexion so she is able to walk with heel toe gait instead of shuffling    Status  Achieved      PT SHORT TERM GOAL #3   Title  TUG score </= 15 seconds    Status  Achieved      PT SHORT TERM GOAL #4   Title  ambulate for 5 mintues with minimal fatique for 400 feet    Status  Achieved        PT Long Term Goals - 04/19/18 1429      PT LONG TERM GOAL #1   Title  independent with HEP and understand how to progress herself    Time  8    Period  Weeks    Status  New    Target Date  06/14/18      PT LONG TERM GOAL #2   Title  abilty to ambulate 6 minutes without a rest due to improved endurance and gait    Time  8     Period  Weeks    Status  New    Target Date  06/14/18      PT LONG TERM GOAL #3   Title  abilty to go up and down her steps with step over step pattern due to increased bilateral lower extremity strength >/= 4+/5    Time  8    Period  Weeks    Status  New    Target Date  06/14/18      PT LONG TERM GOAL #4   Title  right shoulder strength >/= 4+/5 so she is able to lift an object to the top shelf with greater ease    Time  8    Period  Weeks    Status  New    Target Date  06/14/18      PT LONG TERM GOAL #5   Title  TUG score </= 13 seconds to reduce her risk of falls in a public place    Time  8    Period  Weeks    Status  New    Target Date  06/14/18            Plan - 05/25/18 1043    Clinical Impression Statement  The patient is able to participate in a progression of general strengthening.  Peripheral neuropathy continues to affect sensory and motor function in her feet.  Quad strength slowly improving although she continues to need significant UE use with rising from a standard chair.  Therapist closely supervising for safety secondary to fall risk and to monitor for excessive fatigue.      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; peripheral neuropathy    PT Frequency  2x / week    PT Duration  8 weeks    PT Treatment/Interventions  Scientist, product/process development;Therapeutic activities;Therapeutic exercise;Balance training;Patient/family education;Neuromuscular re-education;Manual techniques;Passive range of motion;Energy conservation    PT Next Visit Plan  continue leg press;  HS push down;  work on bilateral ankle DF and stretch gastro; nustep; hip stretches; step ups, sit to stand    PT Home Exercise Plan  Access Code: [email or web address]       Patient will benefit from skilled therapeutic  intervention in order to improve the following deficits and impairments:  Abnormal gait, Increased fascial restricitons, Pain, Decreased coordination, Decreased mobility, Increased muscle spasms, Decreased activity tolerance, Decreased endurance, Decreased range of motion, Decreased strength, Difficulty walking, Impaired flexibility  Visit Diagnosis: Muscle weakness (generalized)  Other abnormalities of gait and mobility     Problem List Patient Active Problem List   Diagnosis Date Noted  . Physical deconditioning 04/10/2018  . Peripheral neuropathy due to chemotherapy ([place]) 03/29/2018  . Hypotension, unspecified 03/29/2018  . Anemia due to antineoplastic chemotherapy 01/23/2018  . Chronic respiratory failure with hypoxia ([place]) 12/30/2017  . Other constipation 12/29/2017  . Neutropenic fever ([place]) 12/18/2017  . Thrombocytopenia ([place]) 12/18/2017  . Abdominal fullness 12/18/2017  . Acute urinary retention 12/18/2017  . Cough in adult 12/15/2017  . Heart burn 12/15/2017  . Frequent headaches 12/15/2017  . Chemotherapy-induced nausea 12/12/2017  . Diffuse large B cell lymphoma ([place]) 11/29/2017  . Goals of care, counseling/discussion 11/29/2017  . Pathological fracture in neoplastic disease, right humerus, initial encounter for fracture 11/24/2017  . Hyperuricemia 11/22/2017  . Pathological fracture due to neoplastic disease, initial encounter 11/21/2017  . COPD GOLD II 07/05/2017  . COLONIC POLYPS, HYPERPLASTIC, HX OF 05/28/2010  . 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 Depression 05/05/2007   Ruben Im, PT 05/25/18 8:21 PM Phone: [phone] Fax: [phone]  Alvera Singh 05/25/2018, 8:20 PM  [facility] [address], [place], [place], [address] Phone: [phone]   Fax:  [phone]  Name: Brooke Washington MRN: [id number] Date of Birth: 03-22-1941

## 2018-05-30 ENCOUNTER — Encounter: Payer: Self-pay | Admitting: Physical Therapy

## 2018-05-30 ENCOUNTER — Ambulatory Visit: Payer: Medicare Other | Admitting: Physical Therapy

## 2018-05-30 ENCOUNTER — Inpatient Hospital Stay: Payer: Medicare Other

## 2018-05-30 ENCOUNTER — Telehealth: Payer: Self-pay

## 2018-05-30 DIAGNOSIS — M6281 Muscle weakness (generalized): Secondary | ICD-10-CM

## 2018-05-30 DIAGNOSIS — C8338 Diffuse large B-cell lymphoma, lymph nodes of multiple sites: Secondary | ICD-10-CM

## 2018-05-30 DIAGNOSIS — D6481 Anemia due to antineoplastic chemotherapy: Secondary | ICD-10-CM | POA: Diagnosis not present

## 2018-05-30 DIAGNOSIS — I959 Hypotension, unspecified: Secondary | ICD-10-CM | POA: Diagnosis not present

## 2018-05-30 DIAGNOSIS — E790 Hyperuricemia without signs of inflammatory arthritis and tophaceous disease: Secondary | ICD-10-CM | POA: Diagnosis not present

## 2018-05-30 DIAGNOSIS — R2689 Other abnormalities of gait and mobility: Secondary | ICD-10-CM

## 2018-05-30 LAB — CMP (CANCER CENTER ONLY)
ALBUMIN: 3.7 g/dL (ref 3.5–5.0)
ALK PHOS: 116 U/L (ref 38–126)
ALT: 14 U/L (ref 0–44)
AST: 21 U/L (ref 15–41)
Anion gap: 8 (ref 5–15)
BILIRUBIN TOTAL: 0.4 mg/dL (ref 0.3–1.2)
BUN: 9 mg/dL (ref 8–23)
CALCIUM: 9 mg/dL (ref 8.9–10.3)
CO2: 30 mmol/L (ref 22–32)
Chloride: 105 mmol/L (ref 98–111)
Creatinine: 0.79 mg/dL (ref 0.44–1.00)
GFR, Est AFR Am: >60 mL/min (ref >60)
GLUCOSE: 92 mg/dL (ref 70–99)
Potassium: 3.5 mmol/L (ref 3.5–5.1)
Sodium: 143 mmol/L (ref 135–145)
TOTAL PROTEIN: 5.8 g/dL — AB (ref 6.5–8.1)

## 2018-05-30 LAB — CBC WITH DIFFERENTIAL (CANCER CENTER ONLY)
BASOS ABS: 0 10*3/uL (ref 0.0–0.1)
BASOS PCT: 0 %
Eosinophils Absolute: 0.7 10*3/uL — ABNORMAL HIGH (ref 0.0–0.5)
Eosinophils Relative: 11 %
HEMATOCRIT: 35.5 % (ref 34.8–46.6)
HEMOGLOBIN: 11.8 g/dL (ref 11.6–15.9)
LYMPHS PCT: 10 %
Lymphs Abs: 0.7 10*3/uL — ABNORMAL LOW (ref 0.9–3.3)
MCH: 34 pg (ref 25.1–34.0)
MCHC: 33.1 g/dL (ref 31.5–36.0)
MCV: 102.8 fL — ABNORMAL HIGH (ref 79.5–101.0)
MONO ABS: 0.5 10*3/uL (ref 0.1–0.9)
MONOS PCT: 7 %
NEUTROS ABS: 4.8 10*3/uL (ref 1.5–6.5)
Neutrophils Relative %: 72 %
Platelet Count: 232 10*3/uL (ref 145–400)
RBC: 3.46 MIL/uL — ABNORMAL LOW (ref 3.70–5.45)
RDW: 14.6 % — AB (ref 11.2–14.5)
WBC Count: 6.7 10*3/uL (ref 3.9–10.3)

## 2018-05-30 LAB — URIC ACID: Uric Acid, Serum: 3.7 mg/dL (ref 2.5–7.1)

## 2018-05-30 NOTE — Therapy (Signed)
[facility] [address], [place], [place], [address] Phone: [phone]   Fax:  [phone]  Physical Therapy Treatment  Patient Details  Name: Brooke Washington MRN: [id number] Date of Birth: [birth year] Referring Provider: Dr. Ivor Messier   Encounter Date: 05/30/2018  PT End of Session - 05/30/18 1235    Visit Number  12    Date for PT Re-Evaluation  06/14/18    Authorization Type  Medicare    PT Start Time  1230    PT Stop Time  1310    PT Time Calculation (min)  40 min    Activity Tolerance  Patient tolerated treatment well    Behavior During Therapy  [facility] for tasks assessed/performed       Past Medical History:  Diagnosis Date  . Cancer ([place])  APRIL OF 2000   LOW-GRADE NON-HODGKIN'S LYMPHOMA  . COPD (chronic obstructive pulmonary disease) ([place])   . Emphysema of lung ([place])   . Hyperlipidemia     Past Surgical History:  Procedure Laterality Date  . 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 IR FLUORO GUIDE PORT INSERTION RIGHT  12/07/2017  . IR US GUIDE VASC ACCESS RIGHT  12/07/2017  . ORIF HUMERUS FRACTURE Right 11/23/2017   Procedure: OPEN REDUCTION INTERNAL FIXATION (ORIF) HUMERAL SHAFT FRACTURE;  Surgeon: Shona Needles, MD;  Location: [place];  Service: Orthopedics;  Laterality: Right;  . RIGHT OOPHORECTOMY  1976    There were no vitals filed for this visit.  Subjective Assessment - 05/30/18 1237    Subjective  I started a new chemo pill that is making me itchy and I feel a little fatiqued.      Patient Stated Goals  increase strength and improve balance    Currently in Pain?  No/denies    Multiple Pain Sites  No         [facility] PT Assessment - 05/30/18 0001      Timed Up and Go Test   Normal TUG (seconds)  13                   [facility] Adult PT Treatment/Exercise - 05/30/18 0001      Knee/Hip Exercises: Stretches    Other Knee/Hip Stretches  hip flexor stretch on 2nd step  5x each side    Other Knee/Hip Stretches  heel cord stretch in incline 10 sec hold 3x bil       Knee/Hip Exercises: Aerobic   Nustep  L2 3 min Seat 7 ; L1 5 min, , seat #7, arm #8   while assessing the patient progress     Knee/Hip Exercises: Machines for Strengthening   Cybex Knee Flexion  2x10 15#     Cybex Leg Press  seat 7 45# both 15x;  single leg 35# 10x each       Knee/Hip Exercises: Standing   Heel Raises  Both;10 reps      Knee/Hip Exercises: Seated   Long Arc Quad  Strengthening;Right;Left;10 reps    Long Arc Quad Weight  3 lbs.    Ball Squeeze  25x    Clamshell with [employer]   single and double 10x each     Shoulder Exercises: Seated   Elevation  Strengthening;Both;10 reps    Elevation Weight (lbs)  1    Flexion  Strengthening;Both;10 reps;Weights    Flexion Weight (lbs)  1  Abduction  Strengthening;Both;10 reps    ABduction Weight (lbs)  1    Other Seated Exercises  UBE 2 min forward only               PT Short Term Goals - 05/18/18 1032      PT SHORT TERM GOAL #1   Title  understand tips to avoid falls    Status  Achieved      PT SHORT TERM GOAL #2   Title  increased bilateral ankle dorsiflexion so she is able to walk with heel toe gait instead of shuffling    Status  Achieved      PT SHORT TERM GOAL #3   Title  TUG score </= 15 seconds    Status  Achieved      PT SHORT TERM GOAL #4   Title  ambulate for 5 mintues with minimal fatique for 400 feet    Status  Achieved        PT Long Term Goals - 05/30/18 1304      PT LONG TERM GOAL #1   Title  independent with HEP and understand how to progress herself    Baseline  still learning    Time  8    Period  Weeks    Status  On-going      PT LONG TERM GOAL #2   Title  abilty to ambulate 6 minutes without a rest due to improved endurance and gait    Time  8    Period  Weeks    Status  On-going      PT LONG TERM GOAL  #3   Title  abilty to go up and down her steps with step over step pattern due to increased bilateral lower extremity strength >/= 4+/5    Time  8    Status  On-going      PT LONG TERM GOAL #4   Title  right shoulder strength >/= 4+/5 so she is able to lift an object to the top shelf with greater ease    Time  8    Period  Weeks    Status  On-going      PT LONG TERM GOAL #5   Title  TUG score </= 13 seconds to reduce her risk of falls in a public place    Time  8    Period  Weeks    Status  Achieved            Plan - 05/30/18 1311    Clinical Impression Statement  Patient was able to increase single leg press to 35#. Patient has met her TUG goal to 13 seconds due to increased balance. Patient feel more fatiqued since she started the new chemotherapy drug but is doing well with exercise in therapy.  Patient teels she is getting more feeling in her feet. Therapist closely supervising for safety secondary to fall risk and to monitor for excessive fatique.     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; peripheral neuropathy    PT Frequency  2x / week    PT Duration  8 weeks    PT Treatment/Interventions  Electrical Stimulation;Moist Heat;Cryotherapy;Gait training;Stair training;Therapeutic activities;Therapeutic exercise;Balance training;Patient/family education;Neuromuscular re-education;Manual techniques;Passive range of motion;Energy conservation    PT Next Visit Plan  continue leg press;  HS push down;  work on bilateral  ankle DF and stretch gastro; nustep; hip stretches; step ups, sit to stand    PT Home Exercise Plan  Access Code: [email or web address]    Recommended Other Services  MD signed the initial eval    Consulted and Agree with Plan of Care  Patient       Patient will benefit from skilled therapeutic intervention in order to improve the  following deficits and impairments:  Abnormal gait, Increased fascial restricitons, Pain, Decreased coordination, Decreased mobility, Increased muscle spasms, Decreased activity tolerance, Decreased endurance, Decreased range of motion, Decreased strength, Difficulty walking, Impaired flexibility  Visit Diagnosis: Muscle weakness (generalized)  Other abnormalities of gait and mobility     Problem List Patient Active Problem List   Diagnosis Date Noted  . Physical deconditioning 04/10/2018  . Peripheral neuropathy due to chemotherapy ([place]) 03/29/2018  . Hypotension, unspecified 03/29/2018  . Anemia due to antineoplastic chemotherapy 01/23/2018  . Chronic respiratory failure with hypoxia ([place]) 12/30/2017  . Other constipation 12/29/2017  . Neutropenic fever ([place]) 12/18/2017  . Thrombocytopenia ([place]) 12/18/2017  . Abdominal fullness 12/18/2017  . Acute urinary retention 12/18/2017  . Cough in adult 12/15/2017  . Heart burn 12/15/2017  . Frequent headaches 12/15/2017  . Chemotherapy-induced nausea 12/12/2017  . Diffuse large B cell lymphoma ([place]) 11/29/2017  . Goals of care, counseling/discussion 11/29/2017  . Pathological fracture in neoplastic disease, right humerus, initial encounter for fracture 11/24/2017  . Hyperuricemia 11/22/2017  . Pathological fracture due to neoplastic disease, initial encounter 11/21/2017  . COPD GOLD II 07/05/2017  . COLONIC POLYPS, HYPERPLASTIC, HX OF 05/28/2010  . 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[place]) 11/27/2007  . Depression 05/05/2007    Earlie Counts, PT 05/30/18 1:15 PM   [facility] [address] [place], [place], [address] Phone: [phone]   Fax:  [phone]  Name: MYLAH BAYNES MRN: [id number] Date of Birth: July 18, 1941

## 2018-05-30 NOTE — Telephone Encounter (Signed)
Called and given below message. She verbalized understanding. 

## 2018-05-30 NOTE — Telephone Encounter (Signed)
-----   Message from [name], MD sent at 05/30/2018 12:30 PM EDT ----- Regarding: labs are good PLs call her/husband labs are good Uric acid level is better ----- Message ----- From: Interface, Lab In [place] Sent: 05/30/2018  11:38 AM EDT To: [name], MD

## 2018-06-01 ENCOUNTER — Encounter: Payer: Self-pay | Admitting: Hematology and Oncology

## 2018-06-01 ENCOUNTER — Encounter: Payer: Medicare Other | Admitting: Physical Therapy

## 2018-06-01 ENCOUNTER — Other Ambulatory Visit: Payer: Self-pay | Admitting: Hematology and Oncology

## 2018-06-01 DIAGNOSIS — C8588 Other specified types of non-Hodgkin lymphoma, lymph nodes of multiple sites: Secondary | ICD-10-CM

## 2018-06-01 DIAGNOSIS — I9589 Other hypotension: Secondary | ICD-10-CM

## 2018-06-06 ENCOUNTER — Telehealth: Payer: Self-pay | Admitting: Hematology and Oncology

## 2018-06-06 ENCOUNTER — Encounter: Payer: Self-pay | Admitting: Physical Therapy

## 2018-06-06 ENCOUNTER — Inpatient Hospital Stay (HOSPITAL_BASED_OUTPATIENT_CLINIC_OR_DEPARTMENT_OTHER): Payer: Medicare Other | Admitting: Hematology and Oncology

## 2018-06-06 ENCOUNTER — Encounter: Payer: Self-pay | Admitting: Hematology and Oncology

## 2018-06-06 ENCOUNTER — Inpatient Hospital Stay: Payer: Medicare Other

## 2018-06-06 ENCOUNTER — Ambulatory Visit: Payer: Medicare Other | Admitting: Physical Therapy

## 2018-06-06 DIAGNOSIS — C8338 Diffuse large B-cell lymphoma, lymph nodes of multiple sites: Secondary | ICD-10-CM | POA: Diagnosis not present

## 2018-06-06 DIAGNOSIS — R2689 Other abnormalities of gait and mobility: Secondary | ICD-10-CM | POA: Diagnosis not present

## 2018-06-06 DIAGNOSIS — I959 Hypotension, unspecified: Secondary | ICD-10-CM | POA: Diagnosis not present

## 2018-06-06 DIAGNOSIS — E790 Hyperuricemia without signs of inflammatory arthritis and tophaceous disease: Secondary | ICD-10-CM

## 2018-06-06 DIAGNOSIS — M6281 Muscle weakness (generalized): Secondary | ICD-10-CM

## 2018-06-06 DIAGNOSIS — D6481 Anemia due to antineoplastic chemotherapy: Secondary | ICD-10-CM | POA: Diagnosis not present

## 2018-06-06 DIAGNOSIS — C8588 Other specified types of non-Hodgkin lymphoma, lymph nodes of multiple sites: Secondary | ICD-10-CM

## 2018-06-06 DIAGNOSIS — I9589 Other hypotension: Secondary | ICD-10-CM

## 2018-06-06 LAB — CBC WITH DIFFERENTIAL (CANCER CENTER ONLY)
Basophils Absolute: 0 10*3/uL (ref 0.0–0.1)
Basophils Relative: 0 %
Eosinophils Absolute: 0.8 10*3/uL — ABNORMAL HIGH (ref 0.0–0.5)
Eosinophils Relative: 12 %
HEMATOCRIT: 34.9 % (ref 34.8–46.6)
HEMOGLOBIN: 11.7 g/dL (ref 11.6–15.9)
LYMPHS ABS: 0.9 10*3/uL (ref 0.9–3.3)
LYMPHS PCT: 13 %
MCH: 34.3 pg — AB (ref 25.1–34.0)
MCHC: 33.4 g/dL (ref 31.5–36.0)
MCV: 102.8 fL — ABNORMAL HIGH (ref 79.5–101.0)
MONOS PCT: 17 %
Monocytes Absolute: 1.1 10*3/uL — ABNORMAL HIGH (ref 0.1–0.9)
NEUTROS ABS: 3.8 10*3/uL (ref 1.5–6.5)
NEUTROS PCT: 58 %
Platelet Count: 240 10*3/uL (ref 145–400)
RBC: 3.4 MIL/uL — AB (ref 3.70–5.45)
RDW: 14.3 % (ref 11.2–14.5)
WBC: 6.6 10*3/uL (ref 3.9–10.3)

## 2018-06-06 LAB — CMP (CANCER CENTER ONLY)
ALT: 14 U/L (ref 0–44)
ANION GAP: 10 (ref 5–15)
AST: 19 U/L (ref 15–41)
Albumin: 3.6 g/dL (ref 3.5–5.0)
Alkaline Phosphatase: 115 U/L (ref 38–126)
BUN: 8 mg/dL (ref 8–23)
CHLORIDE: 103 mmol/L (ref 98–111)
CO2: 30 mmol/L (ref 22–32)
Calcium: 9.5 mg/dL (ref 8.9–10.3)
Creatinine: 0.85 mg/dL (ref 0.44–1.00)
GFR, Estimated: >60 mL/min (ref >60)
Glucose, Bld: 77 mg/dL (ref 70–99)
POTASSIUM: 3.4 mmol/L — AB (ref 3.5–5.1)
SODIUM: 143 mmol/L (ref 135–145)
Total Bilirubin: 0.4 mg/dL (ref 0.3–1.2)
Total Protein: 5.9 g/dL — ABNORMAL LOW (ref 6.5–8.1)

## 2018-06-06 LAB — URIC ACID: Uric Acid, Serum: 3.3 mg/dL (ref 2.5–7.1)

## 2018-06-06 NOTE — Telephone Encounter (Signed)
Gave PT avs and calendar.

## 2018-06-06 NOTE — Therapy (Signed)
[facility] [address], [place], [place], [address] Phone: [phone]   Fax:  [phone]  Physical Therapy Treatment  Patient Details  Name: Brooke Washington MRN: [id number] Date of Birth: Aug 02, 1941 Referring Provider: Dr. Ivor Messier   Encounter Date: 06/06/2018  PT End of Session - 06/06/18 1155    Visit Number  13    Date for PT Re-Evaluation  06/14/18    Authorization Type  Medicare    PT Start Time  1145    PT Stop Time  1230    PT Time Calculation (min)  45 min    Activity Tolerance  Patient tolerated treatment well       Past Medical History:  Diagnosis Date  . Cancer ([place])  APRIL OF 2000   LOW-GRADE NON-HODGKIN'S LYMPHOMA  . COPD (chronic obstructive pulmonary disease) ([place])   . Emphysema of lung ([place])   . Hyperlipidemia     Past Surgical History:  Procedure Laterality Date  . 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 IR FLUORO GUIDE PORT INSERTION RIGHT  12/07/2017  . IR US GUIDE VASC ACCESS RIGHT  12/07/2017  . ORIF HUMERUS FRACTURE Right 11/23/2017   Procedure: OPEN REDUCTION INTERNAL FIXATION (ORIF) HUMERAL SHAFT FRACTURE;  Surgeon: Shona Needles, MD;  Location: [facility];  Service: Orthopedics;  Laterality: Right;  . RIGHT OOPHORECTOMY  1976    There were no vitals filed for this visit.  Subjective Assessment - 06/06/18 1147    Subjective  I had a bad reaction to that medicine last week.  I'm not having that anymore.  I'm stiff this morning.  My feet are "still weird" especially in my toes.  Worse at night.      Currently in Pain?  No/denies    Multiple Pain Sites  No                       [facility] Adult PT Treatment/Exercise - 06/06/18 0001      Knee/Hip Exercises: Stretches   Other Knee/Hip Stretches  heel cord stretch in incline 10 sec hold 3x bil       Knee/Hip Exercises: Aerobic   Nustep  L2 10 min  Seat 7    no breaks needed     Knee/Hip Exercises: Machines for Strengthening   Cybex Knee Flexion  20# 20x   Cybex Leg Press  seat 7 45# both 15x;  single leg 30# 10x each       Knee/Hip Exercises: Standing   Heel Raises  Both;10 reps    Other Standing Knee Exercises  step taps 10x      Knee/Hip Exercises: Seated   Long Arc Quad  Strengthening;Right;Left;10 reps    Long Arc Quad Weight  3 lbs.    Ball Squeeze  25x    Clamshell with [employer]   single and double 10x each   Other Seated Knee/Hip Exercises  rocker board 3 min     Sit to Sand  10 reps;without UE support   chair plus 2 foam cushions     Shoulder Exercises: Seated   Elevation  Strengthening;Both;10 reps    Elevation Weight (lbs)  1    Flexion  Strengthening;Both;10 reps;Weights    Flexion Weight (lbs)  1    Abduction  Strengthening;Both;10 reps    ABduction Weight (lbs)  1    Other Seated Exercises  UBE 4 min forward only               PT Short Term Goals - 06/06/18 1206      PT SHORT TERM GOAL #1   Title  understand tips to avoid falls    Status  Achieved      PT SHORT TERM GOAL #2   Title  increased bilateral ankle dorsiflexion so she is able to walk with heel toe gait instead of shuffling    Status  Achieved      PT SHORT TERM GOAL #3   Title  TUG score </= 15 seconds    Status  Achieved      PT SHORT TERM GOAL #4   Title  ambulate for 5 mintues with minimal fatique for 400 feet    Status  Achieved        PT Long Term Goals - 06/06/18 1206      PT LONG TERM GOAL #1   Title  independent with HEP and understand how to progress herself    Time  8    Period  Weeks    Status  On-going      PT LONG TERM GOAL #2   Title  abilty to ambulate 6 minutes without a rest due to improved endurance and gait    Time  8    Period  Weeks    Status  On-going      PT LONG TERM GOAL #3   Title  abilty to go up and down her steps with step over step pattern due to increased bilateral lower  extremity strength >/= 4+/5    Time  8    Period  Weeks    Status  On-going      PT LONG TERM GOAL #4   Title  right shoulder strength >/= 4+/5 so she is able to lift an object to the top shelf with greater ease    Time  8    Period  Weeks    Status  On-going      PT LONG TERM GOAL #5   Title  TUG score </= 13 seconds to reduce her risk of falls in a public place    Status  Achieved            Plan - 06/06/18 1156    Clinical Impression Statement  The continues to have LE neuropathy which affects her balance.  She also continues to have LE weakness particularly affecting her quad muscles.  She depends on her UEs to assist her with sit to stand.   Close supervision needed in standing for safety secondary to high risk for falls.      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; peripheral neuropathy    PT Frequency  2x / week    PT Duration  8 weeks    PT Treatment/Interventions  Electrical Stimulation;Moist Heat;Cryotherapy;Gait training;Stair training;Therapeutic activities;Therapeutic exercise;Balance training;Patient/family education;Neuromuscular re-education;Manual techniques;Passive range of motion;Energy conservation    PT Next Visit Plan  ERO in 1 week;  continue leg press;  HS push down;  work on bilateral ankle DF and stretch gastro; nustep; hip stretches; step ups, sit to stand    PT Home Exercise Plan  Access Code: [email or web address]       Patient will benefit from skilled therapeutic intervention in order to improve the following  deficits and impairments:  Abnormal gait, Increased fascial restricitons, Pain, Decreased coordination, Decreased mobility, Increased muscle spasms, Decreased activity tolerance, Decreased endurance, Decreased range of motion, Decreased strength, Difficulty walking, Impaired flexibility  Visit Diagnosis: Muscle  weakness (generalized)  Other abnormalities of gait and mobility     Problem List Patient Active Problem List   Diagnosis Date Noted  . Physical deconditioning 04/10/2018  . Peripheral neuropathy due to chemotherapy ([place]) 03/29/2018  . Hypotension, unspecified 03/29/2018  . Anemia due to antineoplastic chemotherapy 01/23/2018  . Chronic respiratory failure with hypoxia ([place]) 12/30/2017  . Other constipation 12/29/2017  . Neutropenic fever ([place]) 12/18/2017  . Thrombocytopenia ([place]) 12/18/2017  . Abdominal fullness 12/18/2017  . Acute urinary retention 12/18/2017  . Cough in adult 12/15/2017  . Heart burn 12/15/2017  . Frequent headaches 12/15/2017  . Chemotherapy-induced nausea 12/12/2017  . Diffuse large B cell lymphoma ([place]) 11/29/2017  . Goals of care, counseling/discussion 11/29/2017  . Pathological fracture in neoplastic disease, right humerus, initial encounter for fracture 11/24/2017  . Hyperuricemia 11/22/2017  . Pathological fracture due to neoplastic disease, initial encounter 11/21/2017  . COPD GOLD II 07/05/2017  . COLONIC POLYPS, HYPERPLASTIC, HX OF 05/28/2010  . 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[place]) 11/27/2007  . Depression 05/05/2007   Ruben Im, PT 06/06/18 12:26 PM Phone: [phone] Fax: [phone]  Alvera Singh 06/06/2018, 12:25 PM  [facility] [address], [place], [place], [address] Phone: [phone]   Fax:  [phone]  Name: Brooke Washington MRN: [id number] Date of Birth: [birth year]

## 2018-06-07 ENCOUNTER — Telehealth: Payer: Self-pay

## 2018-06-07 ENCOUNTER — Encounter: Payer: Self-pay | Admitting: Hematology and Oncology

## 2018-06-07 ENCOUNTER — Ambulatory Visit (HOSPITAL_COMMUNITY)
Admission: RE | Admit: 2018-06-07 | Discharge: 2018-06-07 | Disposition: A | Discharge status: Home / Self Care | Payer: Medicare Other | Source: Ambulatory Visit | Attending: Hematology and Oncology | Admitting: Hematology and Oncology

## 2018-06-07 DIAGNOSIS — I083 Combined rheumatic disorders of mitral, aortic and tricuspid valves: Secondary | ICD-10-CM | POA: Insufficient documentation

## 2018-06-07 DIAGNOSIS — E785 Hyperlipidemia, unspecified: Secondary | ICD-10-CM | POA: Diagnosis not present

## 2018-06-07 DIAGNOSIS — C8380 Other non-follicular lymphoma, unspecified site: Secondary | ICD-10-CM | POA: Insufficient documentation

## 2018-06-07 DIAGNOSIS — C8588 Other specified types of non-Hodgkin lymphoma, lymph nodes of multiple sites: Secondary | ICD-10-CM | POA: Diagnosis not present

## 2018-06-07 DIAGNOSIS — J449 Chronic obstructive pulmonary disease, unspecified: Secondary | ICD-10-CM | POA: Insufficient documentation

## 2018-06-07 DIAGNOSIS — I9589 Other hypotension: Secondary | ICD-10-CM | POA: Insufficient documentation

## 2018-06-07 NOTE — Progress Notes (Signed)
  Echocardiogram 2D Echocardiogram has been performed.  Jannett Celestine 06/07/2018, 12:13 PM

## 2018-06-07 NOTE — Telephone Encounter (Signed)
Called and given below message. She verbalized understanding. 

## 2018-06-07 NOTE — Assessment & Plan Note (Signed)
Her uric acid level has improved She will continue allopurinol

## 2018-06-07 NOTE — Progress Notes (Signed)
[place] OFFICE PROGRESS NOTE  Patient Care Team: [name], NP as PCP - General (Family Medicine)  ASSESSMENT & PLAN:  Large cell lymphoma of multiple sites [facility]) She tolerated treatment well so far without side effects The plan will be to continue treatment for at least 3 months before repeat imaging study She will continue once a week blood draw We discussed the importance of influenza vaccination  Hypotension, unspecified She has occasional dizziness and have low blood pressure Due to exposure to Adriamycin, I recommend echocardiogram and she agreed to proceed  Hyperuricemia Her uric acid level has improved She will continue allopurinol   No orders of the defined types were placed in this encounter.   INTERVAL HISTORY: Please see below for problem oriented charting. 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 She was treated with chlorambucil in 2000.   --2007 - 09/2008.  Treated with single agent Rituxan.   --12/2008.  She had progressive adenopathy in chest and abdomen and repeat biopsy showed low grade follicular and diffuse B cell NHL.   --01/2009 - 07/2009. She required left ureteral stent and was treated with Treanda/Rituxan x 5 cycles from 02-2009 thru 06-2009.     11/20/2008 Imaging    Mildly displaced and angulated pathologic fracture of the proximal humeral shaft. 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     09/16/2014 Imaging    No lymphadenopathy in the chest, abdomen, or pelvis. The small right axillary lymph nodes seen on the previous exam are unchanged in the 18 month interval. Small to moderate hiatal hernia.    11/22/2017 PET scan    New hypermetabolic lymphadenopathy within the chest and abdomen, consistent with recurrent lymphoma. New large hypermetabolic mass in the right hepatic lobe, consistent with lymphoma this involvement. New small hypermetabolic mass in the left kidney, which may be due to lymphomatous involvement although primary renal cell carcinoma cannot be excluded. Hypermetabolic mass in the right humerus with pathologic fracture, consistent with lymphomatous involvement. Small hypermetabolic foci in right posterior chest wall, also suspicious for recurrent lymphoma.    11/23/2017 Procedure    Bone, fragment(s), Right Humeral Shaft Reamings - ATYPICAL LYMPHOID INFILTRATE CONSISTENT WITH NON-HODGKIN'S B CELL LYMPHOMA. - SEE ONCOLOGY TABLE. Microscopic Comment LYMPHOMA Histologic type: Non-Hodgkins lymphoma, favor large cell type. Grade (if applicable): Favor high grade. Flow cytometry: No specimen is available for analysis. Immunohistochemical stains: BCL-2, BCL-6, CD3, CD5, CD10, CD15, CD20, CD30, CD34, CD43, LCA, CD79a, CD138, Ki-67, PAX-5 and TdT with appropriate controls as performed on block 1C. Touch preps/imprints: Not performed. Comments: The sections show multiple, primarily soft tissue fragments displaying a dense lymphoid infiltrate characterized by a mixture of small round to slightly irregular lymphocytes, histiocytes, and large centroblastic appearing lymphoid cells with partially clumped to vesicular chromatin, small nucleoli and amphophilic to clear cytoplasm. The number of admixed large lymphoid cells is variable but they appear relatively abundant in some areas associated with clustering. No material is available  for flow cytometric  analysis. Hence, a large batter of immunohistochemical stains were performed and show a mixture of T and B cells but with a significant B cell component mostly composed of large lymphoid cells with positivity for CD79a, PAX-5, CD30, BCL-2, and BCL-6. LCA shows diffuse staining although some of the large lymphoid cells appear negative. No significant staining is seen with CD20, CD10, CD15, CD34, TdT, or CD138. The lack of CD20 expression in this material is possibly related to prior treatment. There is variable increased expression of Ki-67 ranging from 10 to >50% in some areas particularly where there is abundance of large lymphoid cells. There is admixed abundant T cell population in the background as seen with CD3, CD5, and CD43 with on apparent co-expression of CD5 in B cell areas. The overall findings are consistent with involvement by non-Hodgkin's B cell lymphoma and the presence of relative abundance of large lymphoid B-cells and increased Ki-67 expression favor a high grade large B cell lymphoma.    11/23/2017 Surgery    1. CPT 24516-Intramedullary nailing of right humerus fracture 2. CPT 20245-Bone biopsy, deep      11/23/2017 - 11/25/2017 Hospital Admission    She was admitted to the hospital for management of humerus fracture and aggressive lymphoma    11/24/2017 Imaging    ECHO showed LV EF: 65% -   70%    11/25/2017 Procedure    Status post ultrasound-guided biopsy of liver mass. Tissue specimen sent to pathology for complete histopathologic analysis    12/07/2017 Imaging    Successful placement of a right internal jugular approach power injectable Port-A-Cath. The catheter is ready for immediate use.    12/09/2017 -  Chemotherapy    The patient had R-CHOP chemo      12/17/2017 - 12/20/2017 Hospital Admission    She was hospitalized for influenza infection    01/03/2018 Procedure    Unsuccessful initial L4-5 LP. Successful L2-3 lumbar puncture with installation of methotrexate.     01/24/2018 Procedure    Non complicated injection of intrathecal methotrexate, as above.    02/09/2018 Imaging    LV EF: 55% -  60%    02/10/2018 PET scan    1. Interval response to therapy. There has been resolution of previous hypermetabolic tumor within the chest and abdomen. Deauville criteria 2 and 3. 2. Diffuse radiotracer uptake is identified within the axial and appendicular skeleton which is favored to represent treatment related changes. 3. Significant decrease in FDG uptake associated with previously noted hypermetabolic tumor involving the proximal humeral diaphysis. The FDG uptake within this area on today's study is equivalent to that which is seen in the lumbar spine. 4. Aortic Atherosclerosis (ICD10-I70.0). Lad coronary artery calcifications. 5. Hiatal hernia    02/15/2018 Procedure    Fluoroscopic guided lumbar puncture and intrathecal injection of chemotherapy without complication.    03/31/2018 Procedure    Technically successful fluoroscopic guided lumbar puncture for intrathecal methotrexate injection. Procedure was well tolerated without evidence for immediate complications. Patient will recover in short stay prior to being discharged home    05/08/2018 PET scan    No residual/persistent enlarged or hypermetabolic lymph nodes in the neck, chest, abdomen or pelvis. Findings suggest an excellent response to treatment.    05/25/2018 -  Chemotherapy    The patient had Revlimid for maintenance chemo      REVIEW OF SYSTEMS:   Constitutional: Denies fevers, chills or abnormal weight loss Eyes: Denies blurriness of vision Ears, nose, mouth,  throat, and face: Denies mucositis or sore throat Respiratory: Denies cough, dyspnea or wheezes Cardiovascular: Denies palpitation, chest discomfort or lower extremity swelling Gastrointestinal:  Denies nausea, heartburn or change in bowel habits Skin: Denies abnormal skin rashes Lymphatics: Denies new lymphadenopathy or easy  bruising Neurological:Denies numbness, tingling or new weaknesses Behavioral/Psych: Mood is stable, no new changes  All other systems were reviewed with the patient and are negative.  I have reviewed the past medical history, past surgical history, social history and family history with the patient and they are unchanged from previous note.  ALLERGIES:  is allergic to contrast media [iodinated diagnostic agents]; iohexol; povidone-iodine; penicillins; and adhesive [tape].  MEDICATIONS:  Current Outpatient Medications  Medication Sig Dispense Refill  . acetaminophen (TYLENOL) 325 MG tablet Take 2 tablets (650 mg total) by mouth every 6 (six) hours as needed for mild pain (or Fever >/= 101). (Patient not taking: Reported on 04/19/2018)    . allopurinol (ZYLOPRIM) 300 MG tablet Take 1 tablet (300 mg total) by mouth daily. 30 tablet 1  . aspirin 81 MG tablet Take 81 mg by mouth daily.    . citalopram (CELEXA) 40 MG tablet TAKE 1 TABLET BY MOUTH DAILY 90 tablet 1  . fluticasone furoate-vilanterol (BREO ELLIPTA) 100-25 MCG/INH AEPB Inhale 1 puff into the lungs every morning.    Marland Kitchen lenalidomide (REVLIMID) 15 MG capsule Take 1 capsule (24m) by mouth once daily for 21 days on, 7 days off, repeat every 28 days 21 capsule 0  . pantoprazole (PROTONIX) 40 MG tablet Take 1 tablet (40 mg total) by mouth daily. 180 tablet 3   No current facility-administered medications for this visit.     PHYSICAL EXAMINATION: ECOG PERFORMANCE STATUS: 1 - Symptomatic but completely ambulatory  Vitals:   06/06/18 0854  BP: (!) 109/52  Pulse: 78  Resp: 18  Temp: 98 F (36.7 C)  SpO2: 97%   Filed Weights   06/06/18 0854  Weight: 119 lb 6.4 oz (54.2 kg)    GENERAL:alert, no distress and comfortable SKIN: skin color, texture, turgor are normal, no rashes or significant lesions EYES: normal, Conjunctiva are pink and non-injected, sclera clear OROPHARYNX:no exudate, no erythema and lips, buccal mucosa, and tongue  normal  NECK: supple, thyroid normal size, non-tender, without nodularity LYMPH:  no palpable lymphadenopathy in the cervical, axillary or inguinal LUNGS: clear to auscultation and percussion with normal breathing effort HEART: regular rate & rhythm and no murmurs and no lower extremity edema ABDOMEN:abdomen soft, non-tender and normal bowel sounds Musculoskeletal:no cyanosis of digits and no clubbing  NEURO: alert & oriented x 3 with fluent speech, no focal motor/sensory deficits  LABORATORY DATA:  I have reviewed the data as listed    Component Value Date/Time   NA 143 06/06/2018 0836   NA 142 12/28/2016 1002   K 3.4 (L) 06/06/2018 0836   K 4.6 12/28/2016 1002   CL 103 06/06/2018 0836   CL 101 03/28/2013 0826   CO2 30 06/06/2018 0836   CO2 29 12/28/2016 1002   GLUCOSE 77 06/06/2018 0836   GLUCOSE 85 12/28/2016 1002   GLUCOSE 84 03/28/2013 0826   BUN 8 06/06/2018 0836   BUN 20.3 12/28/2016 1002   CREATININE 0.85 06/06/2018 0836   CREATININE 0.9 12/28/2016 1002   CALCIUM 9.5 06/06/2018 0836   CALCIUM 10.1 12/28/2016 1002   PROT 5.9 (L) 06/06/2018 0836   PROT 6.8 12/28/2016 1002   ALBUMIN 3.6 06/06/2018 0836   ALBUMIN 4.2 12/28/2016 1002  AST 19 06/06/2018 0836   AST 20 12/28/2016 1002   ALT 14 06/06/2018 0836   ALT 12 12/28/2016 1002   ALKPHOS 115 06/06/2018 0836   ALKPHOS 111 12/28/2016 1002   BILITOT 0.4 06/06/2018 0836   BILITOT 0.55 12/28/2016 1002   GFRNONAA >60 06/06/2018 0836   GFRAA >60 06/06/2018 0836    No results found for: SPEP, UPEP  Lab Results  Component Value Date   WBC 6.6 06/06/2018   NEUTROABS 3.8 06/06/2018   HGB 11.7 06/06/2018   HCT 34.9 06/06/2018   MCV 102.8 (H) 06/06/2018   PLT 240 06/06/2018      Chemistry      Component Value Date/Time   NA 143 06/06/2018 0836   NA 142 12/28/2016 1002   K 3.4 (L) 06/06/2018 0836   K 4.6 12/28/2016 1002   CL 103 06/06/2018 0836   CL 101 03/28/2013 0826   CO2 30 06/06/2018 0836   CO2 29  12/28/2016 1002   BUN 8 06/06/2018 0836   BUN 20.3 12/28/2016 1002   CREATININE 0.85 06/06/2018 0836   CREATININE 0.9 12/28/2016 1002      Component Value Date/Time   CALCIUM 9.5 06/06/2018 0836   CALCIUM 10.1 12/28/2016 1002   ALKPHOS 115 06/06/2018 0836   ALKPHOS 111 12/28/2016 1002   AST 19 06/06/2018 0836   AST 20 12/28/2016 1002   ALT 14 06/06/2018 0836   ALT 12 12/28/2016 1002   BILITOT 0.4 06/06/2018 0836   BILITOT 0.55 12/28/2016 1002       All questions were answered. The patient knows to call the clinic with any problems, questions or concerns. No barriers to learning was detected.  I spent 15 minutes counseling the patient face to face. The total time spent in the appointment was 20 minutes and more than 50% was on counseling and review of test results  [name], MD 06/07/2018 1:41 PM

## 2018-06-07 NOTE — Telephone Encounter (Signed)
-----   Message from [name], MD sent at 06/07/2018  1:59 PM EDT ----- Regarding: ECHO normal pls let her know ECHo is normal ----- Message ----- From: Interface, [place] Sent: 06/07/2018   1:59 PM EDT To: [name], MD

## 2018-06-07 NOTE — Assessment & Plan Note (Signed)
She tolerated treatment well so far without side effects The plan will be to continue treatment for at least 3 months before repeat imaging study She will continue once a week blood draw We discussed the importance of influenza vaccination

## 2018-06-07 NOTE — Assessment & Plan Note (Signed)
She has occasional dizziness and have low blood pressure Due to exposure to Adriamycin, I recommend echocardiogram and she agreed to proceed

## 2018-06-08 ENCOUNTER — Telehealth: Payer: Self-pay

## 2018-06-08 ENCOUNTER — Encounter: Payer: Self-pay | Admitting: Hematology and Oncology

## 2018-06-08 ENCOUNTER — Encounter: Payer: Medicare Other | Admitting: Physical Therapy

## 2018-06-08 NOTE — Telephone Encounter (Signed)
Called regarding below mychart message today.  FYI...Marland KitchenMarland KitchenGussie woke up this morning and described herself as Clammy and mildly nauseated. Her head and face were clammy feeling.  I took her temperature and blood pressure. Temp=. 97.3 BP = 90/58, pulse 75 I gave her some Gatorade. She also took ONDANSETRON. Also cancelled her appointment at [facility]. If there is anything else I need to do please let us know.  Called and spoke with Mrs. [place] regarding above message. She is up drinking gatorade and walking in her house. She is starting to feel a little better after drinking and taking to Zofran. Per Dr. Alvy Bimler, offered to send a cardiology referral. She declined referral at this time. Instructed to call office if needed or if she wants referral sent. She verbalized understanding.

## 2018-06-13 ENCOUNTER — Inpatient Hospital Stay: Payer: Medicare Other | Attending: Hematology and Oncology

## 2018-06-13 ENCOUNTER — Telehealth: Payer: Self-pay | Admitting: Pharmacist

## 2018-06-13 ENCOUNTER — Other Ambulatory Visit: Payer: Self-pay | Admitting: *Deleted

## 2018-06-13 ENCOUNTER — Telehealth: Payer: Self-pay | Admitting: *Deleted

## 2018-06-13 ENCOUNTER — Encounter: Payer: Self-pay | Admitting: Hematology and Oncology

## 2018-06-13 ENCOUNTER — Ambulatory Visit: Payer: Medicare Other | Attending: Hematology and Oncology | Admitting: Physical Therapy

## 2018-06-13 ENCOUNTER — Encounter: Payer: Self-pay | Admitting: Physical Therapy

## 2018-06-13 DIAGNOSIS — D61810 Antineoplastic chemotherapy induced pancytopenia: Secondary | ICD-10-CM | POA: Insufficient documentation

## 2018-06-13 DIAGNOSIS — R2689 Other abnormalities of gait and mobility: Secondary | ICD-10-CM | POA: Insufficient documentation

## 2018-06-13 DIAGNOSIS — C8338 Diffuse large B-cell lymphoma, lymph nodes of multiple sites: Secondary | ICD-10-CM

## 2018-06-13 DIAGNOSIS — M6281 Muscle weakness (generalized): Secondary | ICD-10-CM | POA: Diagnosis not present

## 2018-06-13 LAB — CMP (CANCER CENTER ONLY)
ALT: 12 U/L (ref 0–44)
AST: 18 U/L (ref 15–41)
Albumin: 3.8 g/dL (ref 3.5–5.0)
Alkaline Phosphatase: 124 U/L (ref 38–126)
Anion gap: 9 (ref 5–15)
BILIRUBIN TOTAL: 0.6 mg/dL (ref 0.3–1.2)
BUN: 13 mg/dL (ref 8–23)
CO2: 28 mmol/L (ref 22–32)
Calcium: 9.4 mg/dL (ref 8.9–10.3)
Chloride: 102 mmol/L (ref 98–111)
Creatinine: 0.79 mg/dL (ref 0.44–1.00)
GFR, Est AFR Am: >60 mL/min (ref >60)
Glucose, Bld: 75 mg/dL (ref 70–99)
POTASSIUM: 3.9 mmol/L (ref 3.5–5.1)
Sodium: 139 mmol/L (ref 135–145)
TOTAL PROTEIN: 6.2 g/dL — AB (ref 6.5–8.1)

## 2018-06-13 LAB — CBC WITH DIFFERENTIAL (CANCER CENTER ONLY)
BASOS ABS: 0 10*3/uL (ref 0.0–0.1)
Basophils Relative: 1 %
Eosinophils Absolute: 0.9 10*3/uL — ABNORMAL HIGH (ref 0.0–0.5)
Eosinophils Relative: 18 %
HEMATOCRIT: 36.6 % (ref 34.8–46.6)
Hemoglobin: 11.7 g/dL (ref 11.6–15.9)
LYMPHS ABS: 0.8 10*3/uL — AB (ref 0.9–3.3)
LYMPHS PCT: 16 %
MCH: 33.4 pg (ref 25.1–34.0)
MCHC: 32 g/dL (ref 31.5–36.0)
MCV: 104.6 fL — AB (ref 79.5–101.0)
MONO ABS: 0.7 10*3/uL (ref 0.1–0.9)
MONOS PCT: 14 %
Neutro Abs: 2.6 10*3/uL (ref 1.5–6.5)
Neutrophils Relative %: 51 %
Platelet Count: 256 10*3/uL (ref 145–400)
RBC: 3.5 MIL/uL — ABNORMAL LOW (ref 3.70–5.45)
RDW: 14 % (ref 11.2–14.5)
WBC Count: 5 10*3/uL (ref 3.9–10.3)

## 2018-06-13 LAB — URIC ACID: URIC ACID, SERUM: 2.6 mg/dL (ref 2.5–7.1)

## 2018-06-13 MED ORDER — LENALIDOMIDE 15 MG PO CAPS: ORAL_CAPSULE | ORAL | 0 refills | Status: DC

## 2018-06-13 NOTE — Therapy (Signed)
[facility] [address], [place], [place], [address] Phone: [phone]   Fax:  [phone]  Physical Therapy Treatment/Recertification   Patient Details  Name: Brooke Washington MRN: [id number] Date of Birth: 1941/05/21 Referring Provider: Dr. Ivor Messier   Encounter Date: 06/13/2018  PT End of Session - 06/13/18 1158    Visit Number  14    Date for PT Re-Evaluation  08/08/18    Authorization Type  Medicare    PT Start Time  6789    PT Stop Time  1102    PT Time Calculation (min)  47 min    Activity Tolerance  Patient tolerated treatment well       Past Medical History:  Diagnosis Date  . Cancer ([place])  APRIL OF 2000   LOW-GRADE NON-HODGKIN'S LYMPHOMA  . COPD (chronic obstructive pulmonary disease) ([place])   . Emphysema of lung ([place])   . Hyperlipidemia     Past Surgical History:  Procedure Laterality Date  . 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 IR FLUORO GUIDE PORT INSERTION RIGHT  12/07/2017  . IR US GUIDE VASC ACCESS RIGHT  12/07/2017  . ORIF HUMERUS FRACTURE Right 11/23/2017   Procedure: OPEN REDUCTION INTERNAL FIXATION (ORIF) HUMERAL SHAFT FRACTURE;  Surgeon: Shona Needles, MD;  Location: [facility];  Service: Orthopedics;  Laterality: Right;  . RIGHT OOPHORECTOMY  1976    There were no vitals filed for this visit.  Subjective Assessment - 06/13/18 1011    Subjective  That chemotherapy pill caused my left foot to really itch.  I'm feeling a little better though.  I'm about 50% better overall.      Patient Stated Goals  increase strength and improve balance    Currently in Pain?  No/denies    Multiple Pain Sites  No         [facility] PT Assessment - 06/13/18 0001      Strength   Right Shoulder Flexion  4+/5    Right Shoulder Extension  4+/5    Right Shoulder ABduction  4+/5    Right Shoulder Internal Rotation  4+/5    Right  Shoulder External Rotation  4+/5    Right Elbow Extension  4+/5    Left Elbow Extension  4+/5    Right Hip Flexion  4+/5    Right Hip Extension  4/5    Right Hip ABduction  4-/5    Left Hip Flexion  4/5    Left Hip Extension  4/5    Left Hip ABduction  4-/5    Right Knee Flexion  4/5    Right Knee Extension  4/5    Left Knee Flexion  4-/5    Left Knee Extension  3+/5    Right Ankle Dorsiflexion  3-/5    Right Ankle Plantar Flexion  3/5    Left Ankle Dorsiflexion  3-/5    Left Ankle Plantar Flexion  3+/5      6 minute walk test results    Aerobic Endurance Distance Walked  710    Endurance additional comments  no rest break       Timed Up and Go Test   Normal TUG (seconds)  13    TUG Comments  increased risk for falls                   [facility] Adult PT Treatment/Exercise - 06/13/18  0001      Self-Care   Self-Care  Other Self-Care Comments    Other Self-Care Comments   discussed       Knee/Hip Exercises: Stretches   Other Knee/Hip Stretches  hip flexor stretch on 2nd step  5x each side    Other Knee/Hip Stretches  heel cord stretch in incline 10 sec hold 3x bil       Knee/Hip Exercises: Aerobic   Nustep  L1 6 min       Knee/Hip Exercises: Machines for Strengthening   Cybex Knee Flexion  20# 10x    Cybex Leg Press  seat 7 45# both 15x;  single leg 30# 10x each              PT Education - 06/13/18 1157    Education Details  Discussed appropriate gym machines (she plans to go with her husband);  AFO for foot drop     Person(s) Educated  Patient;Spouse    Methods  Explanation;Handout;Demonstration    Comprehension  Verbalized understanding       PT Short Term Goals - 06/13/18 1018      PT SHORT TERM GOAL #1   Title  understand tips to avoid falls    Status  Achieved      PT SHORT TERM GOAL #2   Title  increased bilateral ankle dorsiflexion so she is able to walk with heel toe gait instead of shuffling    Status  Achieved      PT SHORT TERM  GOAL #3   Title  TUG score </= 15 seconds    Status  Achieved      PT SHORT TERM GOAL #4   Title  ambulate for 5 mintues with minimal fatique for 400 feet    Status  Achieved        PT Long Term Goals - 06/13/18 1041      PT LONG TERM GOAL #1   Title  independent with HEP and understand how to progress herself    Time  8    Period  Weeks    Status  On-going    [place] Date  08/08/18      PT LONG TERM GOAL #2   Title  abilty to ambulate 6 minutes without a rest due to improved endurance and gait    Status  Achieved      PT LONG TERM GOAL #3   Title  abilty to go up and down her steps with step over step pattern due to increased bilateral lower extremity strength >/= 4+/5    Time  8    Period  Weeks    Status  On-going      PT LONG TERM GOAL #4   Title  right shoulder strength >/= 4+/5 so she is able to lift an object to the top shelf with greater ease    Status  Achieved      PT LONG TERM GOAL #5   Title  TUG score </= 13 seconds to reduce her risk of falls in a public place    Status  Achieved            Plan - 06/13/18 1159    Clinical Impression Statement  The patient reports she is about 50% better overall.  She has made good improvements in 6 min walk test (710 feet without a rest break) and Timed up and go test.  Her UE strength has improved to grossly 4 to 4+/5  throughout.  Her LE strength continues to be weak especially her quads (she is unable to rise from a standard chair without significant UE use).  She also has bilateral ankle dorsiflexion weakness which causes her to "stub her toe" at times increasing her risk for future falls.   Discussed recommendation for AFO to correct this problem.  Encouraged her to disccuss with her doctor.  She plans to go to the gym with her husband and to continue working on her HEP regularly.  She and her husband request a follow up in 1 month to check her progress before discharging her from PT.      Rehab Potential  Excellent     Clinical Impairments Affecting Rehab Potential  ORIF right humerus 11/23/2017; diffuse large B cell lymphoma of lymph nodes of multiple regions 11/26/2017; peripheral neuropathy    PT Frequency  Biweekly    PT Duration  8 weeks    PT Treatment/Interventions  Electrical Stimulation;Moist Heat;Cryotherapy;Gait training;Stair training;Therapeutic activities;Therapeutic exercise;Balance training;Patient/family education;Neuromuscular re-education;Manual techniques;Passive range of motion;Energy conservation    PT Next Visit Plan  follow up in 4 weeks to reassess progress and response to independent HEP and gym program KX next visit    PT Home Exercise Plan  Access Code: [email or web address]    Consulted and Agree with Plan of Care  Family member/caregiver;Patient    Family Member Consulted  husband       Patient will benefit from skilled therapeutic intervention in order to improve the following deficits and impairments:  Abnormal gait, Increased fascial restricitons, Pain, Decreased coordination, Decreased mobility, Increased muscle spasms, Decreased activity tolerance, Decreased endurance, Decreased range of motion, Decreased strength, Difficulty walking, Impaired flexibility  Visit Diagnosis: Muscle weakness (generalized) - Plan: PT plan of care cert/re-cert  Other abnormalities of gait and mobility - Plan: PT plan of care cert/re-cert     Problem List Patient Active Problem List   Diagnosis Date Noted  . Physical deconditioning 04/10/2018  . Peripheral neuropathy due to chemotherapy ([place]) 03/29/2018  . Hypotension, unspecified 03/29/2018  . Chronic respiratory failure with hypoxia ([place]) 12/30/2017  . Other constipation 12/29/2017  . Neutropenic fever ([place]) 12/18/2017  . Thrombocytopenia ([place]) 12/18/2017  . Abdominal fullness 12/18/2017  . Acute urinary retention 12/18/2017  . Heart burn 12/15/2017  . Frequent headaches 12/15/2017  . Diffuse large B cell lymphoma ([place]) 11/29/2017  . Goals of  care, counseling/discussion 11/29/2017  . Pathological fracture in neoplastic disease, right humerus, initial encounter for fracture 11/24/2017  . Hyperuricemia 11/22/2017  . Pathological fracture due to neoplastic disease, initial encounter 11/21/2017  . COPD GOLD II 07/05/2017  . COLONIC POLYPS, HYPERPLASTIC, HX OF 05/28/2010  . 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[place]) 11/27/2007  . Depression 05/05/2007   Ruben Im, PT 06/13/18 12:30 PM Phone: [phone] Fax: [phone] Alvera Singh 06/13/2018, 12:29 PM  [facility] [address], [place], [place], [address] Phone: [phone]   Fax:  [phone]  Name: Brooke Washington MRN: [id number] Date of Birth: 12-30-1940

## 2018-06-13 NOTE — Patient Instructions (Addendum)
    Nu-step,  leg press 45# 2 legs, 30# 1 leg  Hamstring curl 20#    AFO (Ankle Foot Orthosis)  To keep toes from catching the floor, this plastic brace holds the toes up         [facility] [address] [place], [place] [address] Phone # [phone] Fax [phone]

## 2018-06-13 NOTE — Telephone Encounter (Signed)
-----   Message from [name], MD sent at 06/13/2018  1:05 PM EDT ----- Regarding: labs are good pls call her and let her know labs are ok ----- Message ----- From: Interface, Lab In [place] Sent: 06/13/2018  12:04 PM EDT To: [name], MD

## 2018-06-13 NOTE — Telephone Encounter (Signed)
Oral Oncology Pharmacist Encounter  Received message from patient with notification that she was in need of a refill of her Revlimid prescription. I left voicemail for patient with directions to call dispensing pharmacy for refills. Patient is receiving Revlimid through Industrial/product designer assistance program from [employer]. Phone number is [phone] Above information left on patient's voicemail.  Johny Drilling, PharmD, BCPS, BCOP  06/13/2018 8:47 AM Oral Oncology Clinic [phone]

## 2018-06-13 NOTE — Telephone Encounter (Signed)
Notified of message below

## 2018-06-14 ENCOUNTER — Telehealth: Payer: Self-pay | Admitting: Pharmacist

## 2018-06-14 NOTE — Telephone Encounter (Signed)
Oral Oncology Pharmacist Encounter  Received chart message from patient's husband with request to aid in acquisition of next Revlimid fill. Chart message states that they had called [facility] specialty pharmacy to order next fill of Revlimid.  Patient currently enrolled for [employer] patient assistance and is receiving Revlimid at $0 out-of-pocket cost to patient through they are patient assistance pharmacy, [facility] [employer]. Voicemail left for patient yesterday morning stated to call [facility] by [facility] for Revlimid fill.  I called and spoke with patient's husband at the end of the day on 06/13/2018 and discussed correct dispensing pharmacy. I again provided contact information for [facility]. We also discussed the ability to call the phone number on the label on the Revlimid bottle to contact [facility]. Mr. Leske expressed understanding and appreciation.  I received an email from patient's husband this morning with information that he had contacted [facility], they were able to process the Revlimid refill without issue, and Revlimid would be delivered to their home on Thursday, 06/15/2018.  Johny Drilling, PharmD, BCPS, BCOP  06/14/2018 11:34 AM Oral Oncology Clinic [phone]

## 2018-06-20 ENCOUNTER — Inpatient Hospital Stay: Payer: Medicare Other

## 2018-06-20 ENCOUNTER — Encounter: Payer: Self-pay | Admitting: Hematology and Oncology

## 2018-06-20 ENCOUNTER — Inpatient Hospital Stay (HOSPITAL_BASED_OUTPATIENT_CLINIC_OR_DEPARTMENT_OTHER): Payer: Medicare Other | Admitting: Hematology and Oncology

## 2018-06-20 ENCOUNTER — Telehealth: Payer: Self-pay | Admitting: Hematology and Oncology

## 2018-06-20 DIAGNOSIS — D61810 Antineoplastic chemotherapy induced pancytopenia: Secondary | ICD-10-CM

## 2018-06-20 DIAGNOSIS — R634 Abnormal weight loss: Secondary | ICD-10-CM

## 2018-06-20 DIAGNOSIS — C8338 Diffuse large B-cell lymphoma, lymph nodes of multiple sites: Secondary | ICD-10-CM | POA: Diagnosis not present

## 2018-06-20 DIAGNOSIS — C8588 Other specified types of non-Hodgkin lymphoma, lymph nodes of multiple sites: Secondary | ICD-10-CM

## 2018-06-20 DIAGNOSIS — D61818 Other pancytopenia: Secondary | ICD-10-CM

## 2018-06-20 LAB — CBC WITH DIFFERENTIAL (CANCER CENTER ONLY)
Basophils Absolute: 0.1 10*3/uL (ref 0.0–0.1)
Basophils Relative: 5 %
EOS ABS: 0.4 10*3/uL (ref 0.0–0.5)
Eosinophils Relative: 15 %
HEMATOCRIT: 39.1 % (ref 34.8–46.6)
HEMOGLOBIN: 12.6 g/dL (ref 11.6–15.9)
LYMPHS PCT: 25 %
Lymphs Abs: 0.6 10*3/uL — ABNORMAL LOW (ref 0.9–3.3)
MCH: 33.2 pg (ref 25.1–34.0)
MCHC: 32.2 g/dL (ref 31.5–36.0)
MCV: 103.2 fL — ABNORMAL HIGH (ref 79.5–101.0)
MONO ABS: 0.2 10*3/uL (ref 0.1–0.9)
Monocytes Relative: 9 %
NEUTROS PCT: 46 %
Neutro Abs: 1.2 10*3/uL — ABNORMAL LOW (ref 1.5–6.5)
Platelet Count: 206 10*3/uL (ref 145–400)
RBC: 3.79 MIL/uL (ref 3.70–5.45)
RDW: 14 % (ref 11.2–14.5)
WBC Count: 2.5 10*3/uL — ABNORMAL LOW (ref 3.9–10.3)

## 2018-06-20 MED ORDER — HEPARIN SOD (PORK) LOCK FLUSH 100 UNIT/ML IV SOLN
500.0000 [IU] | Freq: Once | INTRAVENOUS | Status: AC
  Administered 2018-06-20: 500 [IU]
  Filled 2018-06-20: qty 5

## 2018-06-20 MED ORDER — SODIUM CHLORIDE 0.9% FLUSH
10.0000 mL | Freq: Once | INTRAVENOUS | Status: AC
  Administered 2018-06-20: 10 mL
  Filled 2018-06-20: qty 10

## 2018-06-20 NOTE — Progress Notes (Signed)
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  Instead, I plan to bring her back next Monday on June 26, 2018 to repeat blood work.  If her pancytopenia resolves, she will start cycle 2 on that day.  I plan to see her back 4 weeks from now for further supportive care and follow-up. She had prior hyperuricemia.  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 Recommend recheck blood work next week before resumption of cycle 2 and she agreed   Weight loss She has progressive weight loss despite feeling that she is eating adequate oral intake.  Her husband disagree.  We discussed the importance of increased nutritional supplement as tolerated   Orders Placed This Encounter  Procedures  . Uric acid    Standing Status:   Standing    Number of Occurrences:   9    Standing Expiration Date:   06/21/2019    INTERVAL HISTORY: Please see below for problem oriented charting. She returns for further follow-up and toxicity review She has lost some weight since last time I saw her but claims she is eating well Denies recent infection, fever or chills No new lymphadenopathy Denies bone pain.   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 She was  treated with chlorambucil in 2000.   --2007 - 09/2008.  Treated with single agent Rituxan.   --12/2008.  She had progressive adenopathy in chest and abdomen and repeat biopsy showed low grade follicular and diffuse B cell NHL.   --01/2009 - 07/2009. She required left ureteral stent and was treated with Treanda/Rituxan x 5 cycles from 02-2009 thru 06-2009.     09/16/2014 Imaging    No lymphadenopathy in the chest, abdomen, or pelvis. The small right axillary lymph nodes seen on the previous exam are unchanged in the 18 month interval. Small to moderate hiatal hernia.    11/20/2017 Imaging    Mildly displaced and angulated pathologic fracture of the proximal humeral shaft. 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     11/22/2017 PET scan    New hypermetabolic lymphadenopathy within the chest and abdomen, consistent with recurrent lymphoma. New large hypermetabolic mass in the right hepatic lobe, consistent with lymphoma this involvement. New small hypermetabolic mass in the left kidney, which may be due to lymphomatous involvement although primary renal cell carcinoma cannot be excluded. Hypermetabolic mass in the right humerus with pathologic fracture, consistent with lymphomatous involvement. Small hypermetabolic foci in right posterior chest wall, also suspicious for recurrent lymphoma.    11/23/2017 Procedure    Bone, fragment(s), Right Humeral Shaft Reamings - ATYPICAL LYMPHOID INFILTRATE CONSISTENT WITH NON-HODGKIN'S B CELL LYMPHOMA. - SEE ONCOLOGY TABLE. Microscopic Comment LYMPHOMA Histologic type: Non-Hodgkins lymphoma, favor large cell type. Grade (if applicable): Favor high  grade. Flow cytometry: No specimen is available for analysis. Immunohistochemical stains: BCL-2, BCL-6, CD3, CD5, CD10, CD15, CD20, CD30, CD34, CD43, LCA, CD79a, CD138, Ki-67, PAX-5 and TdT with appropriate controls as performed on block 1C. Touch preps/imprints: Not  performed. Comments: The sections show multiple, primarily soft tissue fragments displaying a dense lymphoid infiltrate characterized by a mixture of small round to slightly irregular lymphocytes, histiocytes, and large centroblastic appearing lymphoid cells with partially clumped to vesicular chromatin, small nucleoli and amphophilic to clear cytoplasm. The number of admixed large lymphoid cells is variable but they appear relatively abundant in some areas associated with clustering. No material is available for flow cytometric analysis. Hence, a large batter of immunohistochemical stains were performed and show a mixture of T and B cells but with a significant B cell component mostly composed of large lymphoid cells with positivity for CD79a, PAX-5, CD30, BCL-2, and BCL-6. LCA shows diffuse staining although some of the large lymphoid cells appear negative. No significant staining is seen with CD20, CD10, CD15, CD34, TdT, or CD138. The lack of CD20 expression in this material is possibly related to prior treatment. There is variable increased expression of Ki-67 ranging from 10 to >50% in some areas particularly where there is abundance of large lymphoid cells. There is admixed abundant T cell population in the background as seen with CD3, CD5, and CD43 with on apparent co-expression of CD5 in B cell areas. The overall findings are consistent with involvement by non-Hodgkin's B cell lymphoma and the presence of relative abundance of large lymphoid B-cells and increased Ki-67 expression favor a high grade large B cell lymphoma.    11/23/2017 Surgery    1. CPT 24516-Intramedullary nailing of right humerus fracture 2. CPT 20245-Bone biopsy, deep      11/23/2017 - 11/25/2017 Hospital Admission    She was admitted to the hospital for management of humerus fracture and aggressive lymphoma    11/24/2017 Imaging    ECHO showed LV EF: 65% -   70%    11/25/2017 Procedure    Status post ultrasound-guided biopsy  of liver mass. Tissue specimen sent to pathology for complete histopathologic analysis    12/07/2017 Imaging    Successful placement of a right internal jugular approach power injectable Port-A-Cath. The catheter is ready for immediate use.    12/09/2017 - 03/29/2018 Chemotherapy    The patient had R-CHOP chemo      12/17/2017 - 12/20/2017 Hospital Admission    She was hospitalized for influenza infection    01/03/2018 Procedure    Unsuccessful initial L4-5 LP. Successful L2-3 lumbar puncture with installation of methotrexate.    01/24/2018 Procedure    Non complicated injection of intrathecal methotrexate, as above.    02/09/2018 Imaging    LV EF: 55% -  60%    02/10/2018 PET scan    1. Interval response to therapy. There has been resolution of previous hypermetabolic tumor within the chest and abdomen. Deauville criteria 2 and 3. 2. Diffuse radiotracer uptake is identified within the axial and appendicular skeleton which is favored to represent treatment related changes. 3. Significant decrease in FDG uptake associated with previously noted hypermetabolic tumor involving the proximal humeral diaphysis. The FDG uptake within this area on today's study is equivalent to that which is seen in the lumbar spine. 4. Aortic Atherosclerosis (ICD10-I70.0). Lad coronary artery calcifications. 5. Hiatal hernia    02/15/2018 Procedure    Fluoroscopic guided lumbar puncture and intrathecal injection of chemotherapy without complication.  03/31/2018 Procedure    Technically successful fluoroscopic guided lumbar puncture for intrathecal methotrexate injection. Procedure was well tolerated without evidence for immediate complications. Patient will recover in short stay prior to being discharged home    05/08/2018 PET scan    No residual/persistent enlarged or hypermetabolic lymph nodes in the neck, chest, abdomen or pelvis. Findings suggest an excellent response to treatment.    05/25/2018 -  Chemotherapy     The patient had Revlimid for maintenance chemo     06/07/2018 Imaging    LV EF: 55% -  60%     REVIEW OF SYSTEMS:   Constitutional: Denies fevers, chills  Eyes: Denies blurriness of vision Ears, nose, mouth, throat, and face: Denies mucositis or sore throat Respiratory: Denies cough, dyspnea or wheezes Cardiovascular: Denies palpitation, chest discomfort or lower extremity swelling Gastrointestinal:  Denies nausea, heartburn or change in bowel habits Skin: Denies abnormal skin rashes Lymphatics: Denies new lymphadenopathy or easy bruising Neurological:Denies numbness, tingling or new weaknesses Behavioral/Psych: Mood is stable, no new changes  All other systems were reviewed with the patient and are negative.  I have reviewed the past medical history, past surgical history, social history and family history with the patient and they are unchanged from previous note.  ALLERGIES:  is allergic to contrast media [iodinated diagnostic agents]; iohexol; povidone-iodine; penicillins; and adhesive [tape].  MEDICATIONS:  Current Outpatient Medications  Medication Sig Dispense Refill  . acetaminophen (TYLENOL) 325 MG tablet Take 2 tablets (650 mg total) by mouth every 6 (six) hours as needed for mild pain (or Fever >/= 101). (Patient not taking: Reported on 04/19/2018)    . allopurinol (ZYLOPRIM) 300 MG tablet Take 1 tablet (300 mg total) by mouth daily. 30 tablet 1  . aspirin 81 MG tablet Take 81 mg by mouth daily.    . citalopram (CELEXA) 40 MG tablet TAKE 1 TABLET BY MOUTH DAILY 90 tablet 1  . fluticasone furoate-vilanterol (BREO ELLIPTA) 100-25 MCG/INH AEPB Inhale 1 puff into the lungs every morning.    Marland Kitchen lenalidomide (REVLIMID) 15 MG capsule Take 1 capsule (57m) by mouth once daily for 21 days on, 7 days off, repeat every 28 days 21 capsule 0  . pantoprazole (PROTONIX) 40 MG tablet Take 1 tablet (40 mg total) by mouth daily. 180 tablet 3   No current facility-administered medications  for this visit.     PHYSICAL EXAMINATION: ECOG PERFORMANCE STATUS: 1 - Symptomatic but completely ambulatory  Vitals:   06/20/18 1300  BP: 112/61  Pulse: 86  Resp: 18  Temp: 97.7 F (36.5 C)  SpO2: 100%   Filed Weights   06/20/18 1300  Weight: 117 lb 6.4 oz (53.3 kg)    GENERAL:alert, no distress and comfortable SKIN: skin color, texture, turgor are normal, no rashes or significant lesions EYES: normal, Conjunctiva are pink and non-injected, sclera clear OROPHARYNX:no exudate, no erythema and lips, buccal mucosa, and tongue normal  NECK: supple, thyroid normal size, non-tender, without nodularity LYMPH:  no palpable lymphadenopathy in the cervical, axillary or inguinal LUNGS: clear to auscultation and percussion with normal breathing effort HEART: regular rate & rhythm and no murmurs and no lower extremity edema ABDOMEN:abdomen soft, non-tender and normal bowel sounds Musculoskeletal:no cyanosis of digits and no clubbing  NEURO: alert & oriented x 3 with fluent speech, no focal motor/sensory deficits  LABORATORY DATA:  I have reviewed the data as listed    Component Value Date/Time   NA 139 06/13/2018 1150   NA 142 12/28/2016  1002   K 3.9 06/13/2018 1150   K 4.6 12/28/2016 1002   CL 102 06/13/2018 1150   CL 101 03/28/2013 0826   CO2 28 06/13/2018 1150   CO2 29 12/28/2016 1002   GLUCOSE 75 06/13/2018 1150   GLUCOSE 85 12/28/2016 1002   GLUCOSE 84 03/28/2013 0826   BUN 13 06/13/2018 1150   BUN 20.3 12/28/2016 1002   CREATININE 0.79 06/13/2018 1150   CREATININE 0.9 12/28/2016 1002   CALCIUM 9.4 06/13/2018 1150   CALCIUM 10.1 12/28/2016 1002   PROT 6.2 (L) 06/13/2018 1150   PROT 6.8 12/28/2016 1002   ALBUMIN 3.8 06/13/2018 1150   ALBUMIN 4.2 12/28/2016 1002   AST 18 06/13/2018 1150   AST 20 12/28/2016 1002   ALT 12 06/13/2018 1150   ALT 12 12/28/2016 1002   ALKPHOS 124 06/13/2018 1150   ALKPHOS 111 12/28/2016 1002   BILITOT 0.6 06/13/2018 1150   BILITOT  0.55 12/28/2016 1002   GFRNONAA >60 06/13/2018 1150   GFRAA >60 06/13/2018 1150    No results found for: SPEP, UPEP  Lab Results  Component Value Date   WBC 2.5 (L) 06/20/2018   NEUTROABS 1.2 (L) 06/20/2018   HGB 12.6 06/20/2018   HCT 39.1 06/20/2018   MCV 103.2 (H) 06/20/2018   PLT 206 06/20/2018      Chemistry      Component Value Date/Time   NA 139 06/13/2018 1150   NA 142 12/28/2016 1002   K 3.9 06/13/2018 1150   K 4.6 12/28/2016 1002   CL 102 06/13/2018 1150   CL 101 03/28/2013 0826   CO2 28 06/13/2018 1150   CO2 29 12/28/2016 1002   BUN 13 06/13/2018 1150   BUN 20.3 12/28/2016 1002   CREATININE 0.79 06/13/2018 1150   CREATININE 0.9 12/28/2016 1002      Component Value Date/Time   CALCIUM 9.4 06/13/2018 1150   CALCIUM 10.1 12/28/2016 1002   ALKPHOS 124 06/13/2018 1150   ALKPHOS 111 12/28/2016 1002   AST 18 06/13/2018 1150   AST 20 12/28/2016 1002   ALT 12 06/13/2018 1150   ALT 12 12/28/2016 1002   BILITOT 0.6 06/13/2018 1150   BILITOT 0.55 12/28/2016 1002      All questions were answered. The patient knows to call the clinic with any problems, questions or concerns. No barriers to learning was detected.  I spent 15 minutes counseling the patient face to face. The total time spent in the appointment was 20 minutes and more than 50% was on counseling and review of test results  [name], MD 06/20/2018 6:22 PM

## 2018-06-20 NOTE — Assessment & Plan Note (Signed)
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 Recommend recheck blood work next week before resumption of cycle 2 and she agreed

## 2018-06-20 NOTE — Assessment & Plan Note (Signed)
She tolerated treatment well except for mild pancytopenia I do not recommend her to resume chemotherapy cycle 2 on June 22, 2018.  Instead, I plan to bring her back next Monday on June 26, 2018 to repeat blood work.  If her pancytopenia resolves, she will start cycle 2 on that day.  I plan to see her back 4 weeks from now for further supportive care and follow-up. She had prior hyperuricemia.  I plan to recheck serum uric acid level in her next visit

## 2018-06-20 NOTE — Assessment & Plan Note (Signed)
She has progressive weight loss despite feeling that she is eating adequate oral intake.  Her husband disagree.  We discussed the importance of increased nutritional supplement as tolerated

## 2018-06-20 NOTE — Telephone Encounter (Signed)
Gave avs and calendar ° °

## 2018-06-26 ENCOUNTER — Telehealth: Payer: Self-pay

## 2018-06-26 ENCOUNTER — Inpatient Hospital Stay: Payer: Medicare Other

## 2018-06-26 DIAGNOSIS — C8588 Other specified types of non-Hodgkin lymphoma, lymph nodes of multiple sites: Secondary | ICD-10-CM

## 2018-06-26 DIAGNOSIS — C8338 Diffuse large B-cell lymphoma, lymph nodes of multiple sites: Secondary | ICD-10-CM

## 2018-06-26 DIAGNOSIS — D61810 Antineoplastic chemotherapy induced pancytopenia: Secondary | ICD-10-CM | POA: Diagnosis not present

## 2018-06-26 LAB — CBC WITH DIFFERENTIAL (CANCER CENTER ONLY)
BASOS ABS: 0.3 10*3/uL — AB (ref 0.0–0.1)
BASOS PCT: 8 %
EOS PCT: 9 %
Eosinophils Absolute: 0.4 10*3/uL (ref 0.0–0.5)
HCT: 38.1 % (ref 34.8–46.6)
HEMOGLOBIN: 12.4 g/dL (ref 11.6–15.9)
Lymphocytes Relative: 19 %
Lymphs Abs: 0.8 10*3/uL — ABNORMAL LOW (ref 0.9–3.3)
MCH: 33.7 pg (ref 25.1–34.0)
MCHC: 32.5 g/dL (ref 31.5–36.0)
MCV: 103.5 fL — ABNORMAL HIGH (ref 79.5–101.0)
Monocytes Absolute: 0.8 10*3/uL (ref 0.1–0.9)
Monocytes Relative: 18 %
NEUTROS PCT: 46 %
Neutro Abs: 2.1 10*3/uL (ref 1.5–6.5)
PLATELETS: 248 10*3/uL (ref 145–400)
RBC: 3.68 MIL/uL — AB (ref 3.70–5.45)
RDW: 14.1 % (ref 11.2–14.5)
WBC: 4.5 10*3/uL (ref 3.9–10.3)

## 2018-06-26 LAB — CMP (CANCER CENTER ONLY)
ALT: 10 U/L (ref 0–44)
AST: 17 U/L (ref 15–41)
Albumin: 3.8 g/dL (ref 3.5–5.0)
Alkaline Phosphatase: 124 U/L (ref 38–126)
Anion gap: 8 (ref 5–15)
BILIRUBIN TOTAL: 0.4 mg/dL (ref 0.3–1.2)
BUN: 18 mg/dL (ref 8–23)
CALCIUM: 9.7 mg/dL (ref 8.9–10.3)
CHLORIDE: 106 mmol/L (ref 98–111)
CO2: 30 mmol/L (ref 22–32)
CREATININE: 0.82 mg/dL (ref 0.44–1.00)
Glucose, Bld: 71 mg/dL (ref 70–99)
Potassium: 3.8 mmol/L (ref 3.5–5.1)
Sodium: 144 mmol/L (ref 135–145)
TOTAL PROTEIN: 6.1 g/dL — AB (ref 6.5–8.1)

## 2018-06-26 LAB — URIC ACID: URIC ACID, SERUM: 5.5 mg/dL (ref 2.5–7.1)

## 2018-06-26 NOTE — Telephone Encounter (Signed)
-----   Message from [name], MD sent at 06/26/2018  1:19 PM EDT ----- Regarding: labs are good Let her know labs are good OK to resume chemo pill ----- Message ----- From: Buel Ream, Lab In [place] Sent: 06/26/2018  12:08 PM EDT To: [name], MD

## 2018-06-26 NOTE — Telephone Encounter (Signed)
Called and given below message. She verbalized understanding. 

## 2018-06-27 ENCOUNTER — Encounter: Payer: Self-pay | Admitting: Hematology and Oncology

## 2018-07-07 ENCOUNTER — Other Ambulatory Visit: Payer: Self-pay

## 2018-07-07 DIAGNOSIS — C8338 Diffuse large B-cell lymphoma, lymph nodes of multiple sites: Secondary | ICD-10-CM

## 2018-07-07 MED ORDER — LENALIDOMIDE 15 MG PO CAPS: ORAL_CAPSULE | ORAL | 0 refills | Status: DC

## 2018-07-13 ENCOUNTER — Ambulatory Visit: Payer: Medicare Other | Admitting: Physical Therapy

## 2018-07-15 ENCOUNTER — Other Ambulatory Visit: Payer: Self-pay | Admitting: Adult Health

## 2018-07-17 NOTE — Telephone Encounter (Signed)
Will need to be seen

## 2018-07-20 ENCOUNTER — Ambulatory Visit: Payer: Medicare Other | Attending: Hematology and Oncology | Admitting: Physical Therapy

## 2018-07-20 ENCOUNTER — Encounter: Payer: Self-pay | Admitting: Physical Therapy

## 2018-07-20 DIAGNOSIS — M6281 Muscle weakness (generalized): Secondary | ICD-10-CM | POA: Insufficient documentation

## 2018-07-20 DIAGNOSIS — R2689 Other abnormalities of gait and mobility: Secondary | ICD-10-CM | POA: Diagnosis not present

## 2018-07-20 NOTE — Therapy (Signed)
[facility] [address] [place], [place], [address] Phone: [phone]   Fax:  [phone]  Physical Therapy Treatment  Patient Details  Name: Brooke Washington MRN: [id number] Date of Birth: 19-Oct-1940 Referring Provider (PT): Dr. Ivor Messier   Encounter Date: 07/20/2018  PT End of Session - 07/20/18 1124    Visit Number  5    Date for PT Re-Evaluation  08/08/18    Authorization Type  Medicare    PT Start Time  1100    PT Stop Time  1130   ended due to fatique   PT Time Calculation (min)  30 min    Activity Tolerance  Patient tolerated treatment well    Behavior During Therapy  [facility] for tasks assessed/performed       Past Medical History:  Diagnosis Date  . Cancer ([place])  APRIL OF 2000   LOW-GRADE NON-HODGKIN'S LYMPHOMA  . COPD (chronic obstructive pulmonary disease) ([place])   . Emphysema of lung ([place])   . Hyperlipidemia     Past Surgical History:  Procedure Laterality Date  . 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 IR FLUORO GUIDE PORT INSERTION RIGHT  12/07/2017  . IR US GUIDE VASC ACCESS RIGHT  12/07/2017  . ORIF HUMERUS FRACTURE Right 11/23/2017   Procedure: OPEN REDUCTION INTERNAL FIXATION (ORIF) HUMERAL SHAFT FRACTURE;  Surgeon: Shona Needles, MD;  Location: [place];  Service: Orthopedics;  Laterality: Right;  . RIGHT OOPHORECTOMY  1976    There were no vitals filed for this visit.  Subjective Assessment - 07/20/18 1106    Subjective  I am doing well. I have been going to the gym 3 times per week.     Patient Stated Goals  increase strength and improve balance    Currently in Pain?  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 Ivor Messier    Onset Date/Surgical Date  11/22/17      Precautions   Precautions  Other (comment);Fall    Precaution Comments  cancer; fall risk      Restrictions   Weight Bearing Restrictions  No      Home Film/video editor residence      Prior Function   Level of Independence  Independent      Cognition   Overall Cognitive Status  Within Functional Limits for tasks assessed      ROM / Strength   AROM / PROM / Strength  PROM;Strength      PROM   Right Ankle Dorsiflexion  5    Left Ankle Dorsiflexion  2      Strength   Right Shoulder Flexion  5/5    Right Shoulder Extension  5/5    Right Shoulder ABduction  5/5    Right Shoulder Internal Rotation  5/5    Right Shoulder External Rotation  5/5    Right Elbow Extension  5/5    Left Elbow Extension  5/5    Right Hip Flexion  4+/5    Right Hip Extension  5/5    Right Hip ABduction  5/5  Left Hip Flexion  4+/5    Left Hip Extension  5/5    Left Hip ABduction  4-/5    Right Knee Flexion  5/5    Right Knee Extension  4+/5    Left Knee Flexion  5/5    Left Knee Extension  4+/5    Right Ankle Dorsiflexion  4/5    Right Ankle Plantar Flexion  4-/5    Right Ankle Eversion  5/5    Left Ankle Dorsiflexion  4/5    Left Ankle Plantar Flexion  4-/5    Left Ankle Eversion  5/5      Ambulation/Gait   Ambulation/Gait  Yes    Assistive device  None    Gait Pattern  Decreased dorsiflexion - right;Decreased dorsiflexion - left      Timed Up and Go Test   Normal TUG (seconds)  13    TUG Comments  increased risk for falls                   [facility] Adult PT Treatment/Exercise - 07/20/18 0001      Exercises   Exercises  Knee/Hip      Knee/Hip Exercises: Stretches   Other Knee/Hip Stretches  hip flexor stretch on 2nd step  5x each side    Other Knee/Hip Stretches  heel cord stretch on step      Knee/Hip Exercises: Aerobic   Recumbent Bike  level 2  for 4 min      Knee/Hip  Exercises: Machines for Strengthening   Cybex Knee Flexion  20# 15x    Cybex Leg Press  seat 7  70# both 15x;  single leg 30# 10x each              PT Education - 07/20/18 1123    Education Details  verbally discussed her gym program and what cardio she is doing    Person(s) Educated  Patient    Methods  Explanation    Comprehension  Verbalized understanding       PT Short Term Goals - 07/20/18 1121      PT SHORT TERM GOAL #1   Title  understand tips to avoid falls    Time  4    Period  Weeks    Status  Achieved      PT SHORT TERM GOAL #2   Title  increased bilateral ankle dorsiflexion so she is able to walk with heel toe gait instead of shuffling    Time  4    Period  Weeks    Status  Achieved      PT SHORT TERM GOAL #3   Title  TUG score </= 15 seconds    Time  4    Period  Weeks    Status  Achieved      PT SHORT TERM GOAL #4   Title  ambulate for 5 mintues with minimal fatique for 400 feet    Time  4    Period  Weeks    Status  Achieved        PT Long Term Goals - 07/20/18 1121      PT LONG TERM GOAL #1   Title  independent with HEP and understand how to progress herself    Time  8    Period  Weeks    Status  Achieved      PT LONG TERM GOAL #2   Title  abilty to ambulate 6 minutes without a rest  due to improved endurance and gait    Time  8    Period  Weeks    Status  Achieved      PT LONG TERM GOAL #3   Title  abilty to go up and down her steps with step over step pattern due to increased bilateral lower extremity strength >/= 4+/5    Time  8    Period  Weeks    Status  Achieved      PT LONG TERM GOAL #4   Title  right shoulder strength >/= 4+/5 so she is able to lift an object to the top shelf with greater ease    Time  8    Period  Weeks    Status  Achieved      PT LONG TERM GOAL #5   Title  TUG score </= 13 seconds to reduce her risk of falls in a public place    Time  8    Period  Weeks    Status  Achieved            Plan  - 07/20/18 1134    Clinical Impression Statement  Patient has met all of her goals.  Patient has increased strength of arms and legs.  Patient is able to walk without an assistive device. Patient still has difficulty with heel toe walking due to decreased feeling in bilateral feet and decreased ankle dorsiflexion.  Patient has not ordered an AFO.  Patient is ready for discharge to HEP.     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; peripheral neuropathy    PT Frequency  Biweekly    PT Duration  8 weeks    PT Treatment/Interventions  Electrical Stimulation;Moist Heat;Cryotherapy;Gait training;Stair training;Therapeutic activities;Therapeutic exercise;Balance training;Patient/family education;Neuromuscular re-education;Manual techniques;Passive range of motion;Energy conservation    PT Next Visit Plan  Discharge to HEP     PT Home Exercise Plan  Access Code: [email or web address]    Consulted and Agree with Plan of Care  Patient       Patient will benefit from skilled therapeutic intervention in order to improve the following deficits and impairments:  Abnormal gait, Increased fascial restricitons, Pain, Decreased coordination, Decreased mobility, Increased muscle spasms, Decreased activity tolerance, Decreased endurance, Decreased range of motion, Decreased strength, Difficulty walking, Impaired flexibility  Visit Diagnosis: Muscle weakness (generalized)  Other abnormalities of gait and mobility     Problem List Patient Active Problem List   Diagnosis Date Noted  . Pancytopenia, acquired ([place]) 06/20/2018  . Weight loss 06/20/2018  . Physical deconditioning 04/10/2018  . Peripheral neuropathy due to chemotherapy ([place]) 03/29/2018  . Hypotension, unspecified 03/29/2018  . Chronic respiratory failure with hypoxia ([place]) 12/30/2017  . Other constipation 12/29/2017   . Neutropenic fever ([place]) 12/18/2017  . Thrombocytopenia ([place]) 12/18/2017  . Abdominal fullness 12/18/2017  . Acute urinary retention 12/18/2017  . Heart burn 12/15/2017  . Frequent headaches 12/15/2017  . Diffuse large B cell lymphoma ([place]) 11/29/2017  . Goals of care, counseling/discussion 11/29/2017  . Pathological fracture in neoplastic disease, right humerus, initial encounter for fracture 11/24/2017  . Hyperuricemia 11/22/2017  . Pathological fracture due to neoplastic disease, initial encounter 11/21/2017  . COPD GOLD II 07/05/2017  . COLONIC POLYPS, HYPERPLASTIC, HX OF 05/28/2010  . 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[place]) 11/27/2007  . Depression 05/05/2007    Earlie Counts,  PT 07/20/18 11:38 AM    [facility] [address], [place], [place], [address] Phone: [phone]   Fax:  [phone]  Name: Brooke Washington MRN: [id number] Date of Birth: [birth year]  PHYSICAL THERAPY DISCHARGE SUMMARY  Visits from Start of Care: 5  Current functional level related to goals / functional outcomes: See above.    Remaining deficits: See above.    Education / Equipment: HEP Plan: Patient agrees to discharge.  Patient goals were met. Patient is being discharged due to meeting the stated rehab goals.  Thank you for the referral. Earlie Counts, PT 07/20/18 11:38 AM   ?????

## 2018-07-24 ENCOUNTER — Inpatient Hospital Stay: Payer: Medicare Other

## 2018-07-24 ENCOUNTER — Encounter: Payer: Self-pay | Admitting: Hematology and Oncology

## 2018-07-24 ENCOUNTER — Telehealth: Payer: Self-pay

## 2018-07-24 ENCOUNTER — Inpatient Hospital Stay: Payer: Medicare Other | Attending: Hematology and Oncology

## 2018-07-24 ENCOUNTER — Telehealth: Payer: Self-pay | Admitting: Hematology and Oncology

## 2018-07-24 ENCOUNTER — Inpatient Hospital Stay (HOSPITAL_BASED_OUTPATIENT_CLINIC_OR_DEPARTMENT_OTHER): Payer: Medicare Other | Admitting: Hematology and Oncology

## 2018-07-24 VITALS — BP 112/59 | HR 60 | Temp 97.8°F | Resp 18 | Ht 64.0 in | Wt 116.6 lb

## 2018-07-24 DIAGNOSIS — Z0000 Encounter for general adult medical examination without abnormal findings: Secondary | ICD-10-CM

## 2018-07-24 DIAGNOSIS — E790 Hyperuricemia without signs of inflammatory arthritis and tophaceous disease: Secondary | ICD-10-CM | POA: Diagnosis not present

## 2018-07-24 DIAGNOSIS — C8588 Other specified types of non-Hodgkin lymphoma, lymph nodes of multiple sites: Secondary | ICD-10-CM | POA: Diagnosis not present

## 2018-07-24 DIAGNOSIS — Z23 Encounter for immunization: Secondary | ICD-10-CM | POA: Insufficient documentation

## 2018-07-24 DIAGNOSIS — D61810 Antineoplastic chemotherapy induced pancytopenia: Secondary | ICD-10-CM

## 2018-07-24 DIAGNOSIS — C8338 Diffuse large B-cell lymphoma, lymph nodes of multiple sites: Secondary | ICD-10-CM

## 2018-07-24 DIAGNOSIS — B370 Candidal stomatitis: Secondary | ICD-10-CM | POA: Insufficient documentation

## 2018-07-24 DIAGNOSIS — D61818 Other pancytopenia: Secondary | ICD-10-CM

## 2018-07-24 LAB — CBC WITH DIFFERENTIAL (CANCER CENTER ONLY)
BAND NEUTROPHILS: 2 %
BASOS ABS: 0.2 10*3/uL — AB (ref 0.0–0.1)
Basophils Relative: 7 %
EOS ABS: 0.2 10*3/uL (ref 0.0–0.5)
Eosinophils Relative: 6 %
HCT: 35 % — ABNORMAL LOW (ref 36.0–46.0)
Hemoglobin: 11.7 g/dL — ABNORMAL LOW (ref 12.0–15.0)
LYMPHS ABS: 0.9 10*3/uL (ref 0.7–4.0)
Lymphocytes Relative: 25 %
MCH: 33.4 pg (ref 26.0–34.0)
MCHC: 33.4 g/dL (ref 30.0–36.0)
MCV: 100 fL (ref 80.0–100.0)
Monocytes Absolute: 0.1 10*3/uL (ref 0.1–1.0)
Monocytes Relative: 3 %
NEUTROS ABS: 2.1 10*3/uL (ref 1.7–17.7)
NEUTROS PCT: 57 %
PLATELETS: 253 10*3/uL (ref 150–400)
RBC: 3.5 MIL/uL — AB (ref 3.87–5.11)
RDW: 14.1 % (ref 11.5–15.5)
WBC: 3.5 10*3/uL — AB (ref 4.0–10.5)
nRBC: 0 % (ref 0.0–0.2)

## 2018-07-24 LAB — COMPREHENSIVE METABOLIC PANEL
ALBUMIN: 3.8 g/dL (ref 3.5–5.0)
ALT: 14 U/L (ref 0–44)
ANION GAP: 7 (ref 5–15)
AST: 19 U/L (ref 15–41)
Alkaline Phosphatase: 101 U/L (ref 38–126)
BILIRUBIN TOTAL: 0.7 mg/dL (ref 0.3–1.2)
BUN: 14 mg/dL (ref 8–23)
CO2: 29 mmol/L (ref 22–32)
Calcium: 9.3 mg/dL (ref 8.9–10.3)
Chloride: 107 mmol/L (ref 98–111)
Creatinine, Ser: 0.73 mg/dL (ref 0.44–1.00)
GFR calc Af Amer: >60 mL/min (ref >60)
Glucose, Bld: 88 mg/dL (ref 70–99)
POTASSIUM: 3.7 mmol/L (ref 3.5–5.1)
Sodium: 143 mmol/L (ref 135–145)
TOTAL PROTEIN: 6 g/dL — AB (ref 6.5–8.1)

## 2018-07-24 MED ORDER — SODIUM CHLORIDE 0.9% FLUSH
10.0000 mL | Freq: Once | INTRAVENOUS | Status: AC
  Administered 2018-07-24: 10 mL
  Filled 2018-07-24: qty 10

## 2018-07-24 MED ORDER — INFLUENZA VAC SPLIT QUAD 0.5 ML IM SUSY
PREFILLED_SYRINGE | INTRAMUSCULAR | Status: AC
  Filled 2018-07-24: qty 0.5

## 2018-07-24 MED ORDER — HEPARIN SOD (PORK) LOCK FLUSH 100 UNIT/ML IV SOLN
500.0000 [IU] | Freq: Once | INTRAVENOUS | Status: DC
  Filled 2018-07-24: qty 5

## 2018-07-24 MED ORDER — INFLUENZA VAC SPLIT HIGH-DOSE 0.5 ML IM SUSY
0.5000 mL | PREFILLED_SYRINGE | Freq: Once | INTRAMUSCULAR | Status: AC
  Administered 2018-07-24: 0.5 mL via INTRAMUSCULAR
  Filled 2018-07-24: qty 0.5

## 2018-07-24 NOTE — Assessment & Plan Note (Signed)
She has intermittent hyperuricemia Repeat serum uric acid level is pending and we will call the patient with test results If uric acid level continues to trend up, she will need to resume allopurinol

## 2018-07-24 NOTE — Assessment & Plan Note (Signed)
She has acquired pancytopenia due to treatment She is not symptomatic We will observe only for now.

## 2018-07-24 NOTE — Telephone Encounter (Signed)
Gave pt avs and calendar  °

## 2018-07-24 NOTE — Addendum Note (Signed)
Addended by: Jalene Mullet on: 07/24/2018 10:33 AM   Modules accepted: Orders

## 2018-07-24 NOTE — Telephone Encounter (Signed)
-----   Message from [name], MD sent at 07/24/2018 11:39 AM EDT ----- Regarding: labs are good pls let her know labs are good and stable ----- Message ----- From: Interface, Lab In [place] Sent: 07/24/2018  10:42 AM EDT To: [name], MD

## 2018-07-24 NOTE — Progress Notes (Signed)
[place] OFFICE PROGRESS NOTE  Patient Care Team: [name], NP as PCP - General (Family Medicine)  ASSESSMENT & PLAN:  Large cell lymphoma of multiple sites [facility]) She tolerated treatment well except for mild pancytopenia She is not symptomatic Examination did not reveal any signs of lymphoma recurrence Plan to see her again in 8 weeks for further follow-up We discussed importance of influenza vaccination  Pancytopenia, acquired [facility]) She has acquired pancytopenia due to treatment She is not symptomatic We will observe only for now.  Hyperuricemia She has intermittent hyperuricemia Repeat serum uric acid level is pending and we will call the patient with test results If uric acid level continues to trend up, she will need to resume allopurinol   Orders Placed This Encounter  Procedures  . Comprehensive metabolic panel    INTERVAL HISTORY: Please see below for problem oriented charting. She returns for further follow-up She is taking Revlimid without difficulties Denies abnormal skin rash, diarrhea, infection or other side effects No new lymphadenopathy.  Her appetite is stable.  She is active going to the gym on a regular basis.  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 She was treated with chlorambucil in 2000.   --2007 - 09/2008.  Treated with single agent Rituxan.   --12/2008.  She had progressive adenopathy in chest and abdomen and repeat biopsy showed low grade follicular and diffuse B cell NHL.   --01/2009 - 07/2009. She required left ureteral stent and was treated with Treanda/Rituxan x 5 cycles from 02-2009 thru  06-2009.     09/16/2014 Imaging    No lymphadenopathy in the chest, abdomen, or pelvis. The small right axillary lymph nodes seen on the previous exam are unchanged in the 18 month interval. Small to moderate hiatal hernia.    11/20/2017 Imaging    Mildly displaced and angulated pathologic fracture of the proximal humeral shaft. 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     11/22/2017 PET scan    New hypermetabolic lymphadenopathy within the chest and abdomen, consistent with recurrent lymphoma. New large hypermetabolic mass in the right hepatic lobe, consistent with lymphoma this involvement. New small hypermetabolic mass in the left kidney, which may be due to lymphomatous involvement although primary renal cell carcinoma cannot be excluded. Hypermetabolic mass in the right humerus with pathologic fracture, consistent with lymphomatous involvement. Small hypermetabolic foci in right posterior chest wall, also suspicious for recurrent lymphoma.    11/23/2017 Procedure    Bone, fragment(s), Right Humeral Shaft Reamings - ATYPICAL LYMPHOID INFILTRATE CONSISTENT WITH NON-HODGKIN'S B CELL LYMPHOMA. - SEE ONCOLOGY TABLE. Microscopic Comment LYMPHOMA Histologic type: Non-Hodgkins lymphoma, favor large cell type. Grade (if applicable): Favor high grade. Flow cytometry: No specimen is available for analysis. Immunohistochemical stains: BCL-2, BCL-6, CD3, CD5, CD10, CD15, CD20, CD30, CD34, CD43, LCA, CD79a, CD138, Ki-67, PAX-5 and TdT with appropriate controls as performed on block 1C. Touch preps/imprints: Not performed. Comments: The sections show multiple, primarily soft tissue fragments displaying a dense lymphoid infiltrate characterized by a mixture of small round to slightly irregular lymphocytes, histiocytes, and large centroblastic appearing lymphoid cells with partially clumped to vesicular chromatin, small nucleoli  and amphophilic to clear cytoplasm. The  number of admixed large lymphoid cells is variable but they appear relatively abundant in some areas associated with clustering. No material is available for flow cytometric analysis. Hence, a large batter of immunohistochemical stains were performed and show a mixture of T and B cells but with a significant B cell component mostly composed of large lymphoid cells with positivity for CD79a, PAX-5, CD30, BCL-2, and BCL-6. LCA shows diffuse staining although some of the large lymphoid cells appear negative. No significant staining is seen with CD20, CD10, CD15, CD34, TdT, or CD138. The lack of CD20 expression in this material is possibly related to prior treatment. There is variable increased expression of Ki-67 ranging from 10 to >50% in some areas particularly where there is abundance of large lymphoid cells. There is admixed abundant T cell population in the background as seen with CD3, CD5, and CD43 with on apparent co-expression of CD5 in B cell areas. The overall findings are consistent with involvement by non-Hodgkin's B cell lymphoma and the presence of relative abundance of large lymphoid B-cells and increased Ki-67 expression favor a high grade large B cell lymphoma.    11/23/2017 Surgery    1. CPT 24516-Intramedullary nailing of right humerus fracture 2. CPT 20245-Bone biopsy, deep      11/23/2017 - 11/25/2017 Hospital Admission    She was admitted to the hospital for management of humerus fracture and aggressive lymphoma    11/24/2017 Imaging    ECHO showed LV EF: 65% -   70%    11/25/2017 Procedure    Status post ultrasound-guided biopsy of liver mass. Tissue specimen sent to pathology for complete histopathologic analysis    12/07/2017 Imaging    Successful placement of a right internal jugular approach power injectable Port-A-Cath. The catheter is ready for immediate use.    12/09/2017 - 03/29/2018 Chemotherapy    The patient had R-CHOP chemo      12/17/2017 - 12/20/2017 Hospital Admission     She was hospitalized for influenza infection    01/03/2018 Procedure    Unsuccessful initial L4-5 LP. Successful L2-3 lumbar puncture with installation of methotrexate.    01/24/2018 Procedure    Non complicated injection of intrathecal methotrexate, as above.    02/09/2018 Imaging    LV EF: 55% -  60%    02/10/2018 PET scan    1. Interval response to therapy. There has been resolution of previous hypermetabolic tumor within the chest and abdomen. Deauville criteria 2 and 3. 2. Diffuse radiotracer uptake is identified within the axial and appendicular skeleton which is favored to represent treatment related changes. 3. Significant decrease in FDG uptake associated with previously noted hypermetabolic tumor involving the proximal humeral diaphysis. The FDG uptake within this area on today's study is equivalent to that which is seen in the lumbar spine. 4. Aortic Atherosclerosis (ICD10-I70.0). Lad coronary artery calcifications. 5. Hiatal hernia    02/15/2018 Procedure    Fluoroscopic guided lumbar puncture and intrathecal injection of chemotherapy without complication.    03/31/2018 Procedure    Technically successful fluoroscopic guided lumbar puncture for intrathecal methotrexate injection. Procedure was well tolerated without evidence for immediate complications. Patient will recover in short stay prior to being discharged home    05/08/2018 PET scan    No residual/persistent enlarged or hypermetabolic lymph nodes in the neck, chest, abdomen or pelvis. Findings suggest an excellent response to treatment.    05/25/2018 -  Chemotherapy    The patient had Revlimid  for maintenance chemo     06/07/2018 Imaging    LV EF: 55% -  60%     REVIEW OF SYSTEMS:   Constitutional: Denies fevers, chills or abnormal weight loss Eyes: Denies blurriness of vision Ears, nose, mouth, throat, and face: Denies mucositis or sore throat Respiratory: Denies cough, dyspnea or wheezes Cardiovascular: Denies  palpitation, chest discomfort or lower extremity swelling Gastrointestinal:  Denies nausea, heartburn or change in bowel habits Skin: Denies abnormal skin rashes Lymphatics: Denies new lymphadenopathy or easy bruising Neurological:Denies numbness, tingling or new weaknesses Behavioral/Psych: Mood is stable, no new changes  All other systems were reviewed with the patient and are negative.  I have reviewed the past medical history, past surgical history, social history and family history with the patient and they are unchanged from previous note.  ALLERGIES:  is allergic to contrast media [iodinated diagnostic agents]; iohexol; povidone-iodine; penicillins; and adhesive [tape].  MEDICATIONS:  Current Outpatient Medications  Medication Sig Dispense Refill  . cholecalciferol (VITAMIN D) 1000 units tablet Take 2,000 Units by mouth daily.    Marland Kitchen acetaminophen (TYLENOL) 325 MG tablet Take 2 tablets (650 mg total) by mouth every 6 (six) hours as needed for mild pain (or Fever >/= 101). (Patient not taking: Reported on 04/19/2018)    . allopurinol (ZYLOPRIM) 300 MG tablet Take 1 tablet (300 mg total) by mouth daily. 30 tablet 1  . aspirin 81 MG tablet Take 81 mg by mouth daily.    . citalopram (CELEXA) 40 MG tablet TAKE 1 TABLET BY MOUTH DAILY 90 tablet 1  . fluticasone furoate-vilanterol (BREO ELLIPTA) 100-25 MCG/INH AEPB Inhale 1 puff into the lungs every morning.    Marland Kitchen lenalidomide (REVLIMID) 15 MG capsule Take 1 capsule (30m) by mouth once daily for 21 days on, 7 days off, repeat every 28 days 21 capsule 0  . pantoprazole (PROTONIX) 40 MG tablet Take 1 tablet (40 mg total) by mouth daily. 180 tablet 3   No current facility-administered medications for this visit.     PHYSICAL EXAMINATION: ECOG PERFORMANCE STATUS: 0 - Asymptomatic  Vitals:   07/24/18 0958  BP: (!) 112/59  Pulse: 60  Resp: 18  Temp: 97.8 F (36.6 C)  SpO2: 100%   Filed Weights   07/24/18 0958  Weight: 116 lb 9.6 oz  (52.9 kg)    GENERAL:alert, no distress and comfortable SKIN: skin color, texture, turgor are normal, no rashes or significant lesions EYES: normal, Conjunctiva are pink and non-injected, sclera clear OROPHARYNX:no exudate, no erythema and lips, buccal mucosa, and tongue normal  NECK: supple, thyroid normal size, non-tender, without nodularity LYMPH:  no palpable lymphadenopathy in the cervical, axillary or inguinal LUNGS: clear to auscultation and percussion with normal breathing effort HEART: regular rate & rhythm and no murmurs and no lower extremity edema ABDOMEN:abdomen soft, non-tender and normal bowel sounds Musculoskeletal:no cyanosis of digits and no clubbing  NEURO: alert & oriented x 3 with fluent speech, no focal motor/sensory deficits  LABORATORY DATA:  I have reviewed the data as listed    Component Value Date/Time   NA 143 07/24/2018 0920   NA 142 12/28/2016 1002   K 3.7 07/24/2018 0920   K 4.6 12/28/2016 1002   CL 107 07/24/2018 0920   CL 101 03/28/2013 0826   CO2 29 07/24/2018 0920   CO2 29 12/28/2016 1002   GLUCOSE 88 07/24/2018 0920   GLUCOSE 85 12/28/2016 1002   GLUCOSE 84 03/28/2013 0826   BUN 14 07/24/2018 0920  BUN 20.3 12/28/2016 1002   CREATININE 0.73 07/24/2018 0920   CREATININE 0.82 06/26/2018 1156   CREATININE 0.9 12/28/2016 1002   CALCIUM 9.3 07/24/2018 0920   CALCIUM 10.1 12/28/2016 1002   PROT 6.0 (L) 07/24/2018 0920   PROT 6.8 12/28/2016 1002   ALBUMIN 3.8 07/24/2018 0920   ALBUMIN 4.2 12/28/2016 1002   AST 19 07/24/2018 0920   AST 17 06/26/2018 1156   AST 20 12/28/2016 1002   ALT 14 07/24/2018 0920   ALT 10 06/26/2018 1156   ALT 12 12/28/2016 1002   ALKPHOS 101 07/24/2018 0920   ALKPHOS 111 12/28/2016 1002   BILITOT 0.7 07/24/2018 0920   BILITOT 0.4 06/26/2018 1156   BILITOT 0.55 12/28/2016 1002   GFRNONAA >60 07/24/2018 0920   GFRNONAA >60 06/26/2018 1156   GFRAA >60 07/24/2018 0920   GFRAA >60 06/26/2018 1156    No results  found for: SPEP, UPEP  Lab Results  Component Value Date   WBC 3.5 (L) 07/24/2018   NEUTROABS 2.1 07/24/2018   HGB 11.7 (L) 07/24/2018   HCT 35.0 (L) 07/24/2018   MCV 100.0 07/24/2018   PLT 253 07/24/2018      Chemistry      Component Value Date/Time   NA 143 07/24/2018 0920   NA 142 12/28/2016 1002   K 3.7 07/24/2018 0920   K 4.6 12/28/2016 1002   CL 107 07/24/2018 0920   CL 101 03/28/2013 0826   CO2 29 07/24/2018 0920   CO2 29 12/28/2016 1002   BUN 14 07/24/2018 0920   BUN 20.3 12/28/2016 1002   CREATININE 0.73 07/24/2018 0920   CREATININE 0.82 06/26/2018 1156   CREATININE 0.9 12/28/2016 1002      Component Value Date/Time   CALCIUM 9.3 07/24/2018 0920   CALCIUM 10.1 12/28/2016 1002   ALKPHOS 101 07/24/2018 0920   ALKPHOS 111 12/28/2016 1002   AST 19 07/24/2018 0920   AST 17 06/26/2018 1156   AST 20 12/28/2016 1002   ALT 14 07/24/2018 0920   ALT 10 06/26/2018 1156   ALT 12 12/28/2016 1002   BILITOT 0.7 07/24/2018 0920   BILITOT 0.4 06/26/2018 1156   BILITOT 0.55 12/28/2016 1002     All questions were answered. The patient knows to call the clinic with any problems, questions or concerns. No barriers to learning was detected.  I spent 15 minutes counseling the patient face to face. The total time spent in the appointment was 20 minutes and more than 50% was on counseling and review of test results  [name], MD 07/24/2018 2:35 PM

## 2018-07-24 NOTE — Telephone Encounter (Signed)
-----   Message from [name], MD sent at 07/24/2018 11:39 AM EDT ----- Regarding: labs are good pls let her know labs are good and stable ----- Message ----- From: Interface, Lab In [place] Sent: 07/24/2018  10:42 AM EDT To: [name], MD

## 2018-07-24 NOTE — Assessment & Plan Note (Signed)
She tolerated treatment well except for mild pancytopenia She is not symptomatic Examination did not reveal any signs of lymphoma recurrence Plan to see her again in 8 weeks for further follow-up We discussed importance of influenza vaccination

## 2018-07-24 NOTE — Telephone Encounter (Signed)
Called and given below message. She verbalized understanding. 

## 2018-07-26 ENCOUNTER — Other Ambulatory Visit: Payer: Self-pay

## 2018-07-26 DIAGNOSIS — C8338 Diffuse large B-cell lymphoma, lymph nodes of multiple sites: Secondary | ICD-10-CM

## 2018-07-26 MED ORDER — LENALIDOMIDE 15 MG PO CAPS: ORAL_CAPSULE | ORAL | 0 refills | Status: DC

## 2018-07-30 ENCOUNTER — Encounter: Payer: Self-pay | Admitting: Hematology and Oncology

## 2018-07-31 ENCOUNTER — Telehealth: Payer: Self-pay

## 2018-07-31 NOTE — Telephone Encounter (Signed)
-----   Message from [name], MD sent at 07/31/2018 11:13 AM EDT ----- Regarding: call patient Can you call her/husband? Does she has sore throat or mouth sore? Unlikely due to chemo. Any nasal drainage? Can try OTC cough drops and see if that helps Call back Friday if not better

## 2018-07-31 NOTE — Telephone Encounter (Signed)
Called and given below message. She verbalized understanding. She denies fever. She has nasal drainage all the time and has for years. She will try OTC cough drops and call the office for worsening symptoms. She will call with update on Friday.

## 2018-08-02 ENCOUNTER — Other Ambulatory Visit: Payer: Self-pay | Admitting: Adult Health

## 2018-08-02 DIAGNOSIS — Z760 Encounter for issue of repeat prescription: Secondary | ICD-10-CM

## 2018-08-02 DIAGNOSIS — F32A Depression, unspecified: Secondary | ICD-10-CM

## 2018-08-02 DIAGNOSIS — F329 Major depressive disorder, single episode, unspecified: Secondary | ICD-10-CM

## 2018-08-03 ENCOUNTER — Encounter: Payer: Self-pay | Admitting: Hematology and Oncology

## 2018-08-03 ENCOUNTER — Telehealth: Payer: Self-pay

## 2018-08-03 ENCOUNTER — Other Ambulatory Visit: Payer: Self-pay

## 2018-08-03 ENCOUNTER — Telehealth: Payer: Self-pay | Admitting: Hematology and Oncology

## 2018-08-03 MED ORDER — MAGIC MOUTHWASH W/LIDOCAINE: 5.0000 mL | Freq: Four times a day (QID) | ORAL | 0 refills | Status: DC

## 2018-08-03 NOTE — Telephone Encounter (Signed)
#  30 sent to the pharmacy by e-scribe.  Pt is past due for her yearly physical.

## 2018-08-03 NOTE — Telephone Encounter (Signed)
Spoke to pt regarding upcoming appts per 10/24 sch message.  °

## 2018-08-03 NOTE — Telephone Encounter (Signed)
She called and she does not have magic mouth wash at home. Rx sent to pharmacy

## 2018-08-03 NOTE — Telephone Encounter (Signed)
-----   Message from [name], MD sent at 08/03/2018  8:45 AM EDT ----- Regarding: email Pls call husband BOB Hold chemo at least 1 week.\ We can call in magic mouth wash QID to start If he wants her seen pls schedule with Eliseo Squires today, otherwise I can see her tomorrow

## 2018-08-03 NOTE — Telephone Encounter (Signed)
Oral Oncology Patient Advocate Encounter  Revlimid will need to be renewed before 10/10/18 to continue getting this from [facility]. I filled out a new application for the patient and placed the doctors portion to sign in the folder.  I called the patient to make them aware of the renewal and that I would need income documentation from 2018 as well as their signature.  I left a voicemail for the patient 08/02/18  Will continue to update  [place] Patient [place] Phone [phone] Fax [phone]

## 2018-08-03 NOTE — Telephone Encounter (Signed)
Called and given husband below message. He verbalized understanding. They would like appt with Dr. Alvy Bimler for tomorrow. Scheduling message sent. They have Magic mouth wash at home in the refrigerator already from a couple months ago, will start today.

## 2018-08-04 ENCOUNTER — Telehealth: Payer: Self-pay

## 2018-08-04 ENCOUNTER — Inpatient Hospital Stay (HOSPITAL_BASED_OUTPATIENT_CLINIC_OR_DEPARTMENT_OTHER): Payer: Medicare Other | Admitting: Hematology and Oncology

## 2018-08-04 ENCOUNTER — Inpatient Hospital Stay: Payer: Medicare Other

## 2018-08-04 ENCOUNTER — Encounter: Payer: Self-pay | Admitting: Hematology and Oncology

## 2018-08-04 ENCOUNTER — Telehealth: Payer: Self-pay | Admitting: Hematology and Oncology

## 2018-08-04 DIAGNOSIS — Z23 Encounter for immunization: Secondary | ICD-10-CM | POA: Diagnosis not present

## 2018-08-04 DIAGNOSIS — E790 Hyperuricemia without signs of inflammatory arthritis and tophaceous disease: Secondary | ICD-10-CM | POA: Diagnosis not present

## 2018-08-04 DIAGNOSIS — B370 Candidal stomatitis: Secondary | ICD-10-CM | POA: Insufficient documentation

## 2018-08-04 DIAGNOSIS — C8588 Other specified types of non-Hodgkin lymphoma, lymph nodes of multiple sites: Secondary | ICD-10-CM

## 2018-08-04 DIAGNOSIS — C8338 Diffuse large B-cell lymphoma, lymph nodes of multiple sites: Secondary | ICD-10-CM

## 2018-08-04 DIAGNOSIS — D61810 Antineoplastic chemotherapy induced pancytopenia: Secondary | ICD-10-CM | POA: Diagnosis not present

## 2018-08-04 LAB — CMP (CANCER CENTER ONLY)
ALT: 18 U/L (ref 0–44)
ANION GAP: 10 (ref 5–15)
AST: 18 U/L (ref 15–41)
Albumin: 3.6 g/dL (ref 3.5–5.0)
Alkaline Phosphatase: 119 U/L (ref 38–126)
BUN: 12 mg/dL (ref 8–23)
CHLORIDE: 104 mmol/L (ref 98–111)
CO2: 30 mmol/L (ref 22–32)
Calcium: 9.3 mg/dL (ref 8.9–10.3)
Creatinine: 0.85 mg/dL (ref 0.44–1.00)
Glucose, Bld: 84 mg/dL (ref 70–99)
POTASSIUM: 3.6 mmol/L (ref 3.5–5.1)
SODIUM: 144 mmol/L (ref 135–145)
Total Bilirubin: 0.7 mg/dL (ref 0.3–1.2)
Total Protein: 5.9 g/dL — ABNORMAL LOW (ref 6.5–8.1)

## 2018-08-04 LAB — CBC WITH DIFFERENTIAL (CANCER CENTER ONLY)
Abs Immature Granulocytes: 0.02 10*3/uL (ref 0.00–0.07)
BASOS ABS: 0.1 10*3/uL (ref 0.0–0.1)
Basophils Relative: 2 %
EOS PCT: 9 %
Eosinophils Absolute: 0.4 10*3/uL (ref 0.0–0.5)
HCT: 40.1 % (ref 36.0–46.0)
HEMOGLOBIN: 12.6 g/dL (ref 12.0–15.0)
IMMATURE GRANULOCYTES: 0 %
LYMPHS PCT: 16 %
Lymphs Abs: 0.7 10*3/uL (ref 0.7–4.0)
MCH: 32.5 pg (ref 26.0–34.0)
MCHC: 31.4 g/dL (ref 30.0–36.0)
MCV: 103.4 fL — ABNORMAL HIGH (ref 80.0–100.0)
Monocytes Absolute: 0.6 10*3/uL (ref 0.1–1.0)
Monocytes Relative: 14 %
NEUTROS ABS: 2.7 10*3/uL (ref 1.7–7.7)
NEUTROS PCT: 59 %
NRBC: 0 % (ref 0.0–0.2)
Platelet Count: 186 10*3/uL (ref 150–400)
RBC: 3.88 MIL/uL (ref 3.87–5.11)
RDW: 14.5 % (ref 11.5–15.5)
WBC: 4.6 10*3/uL (ref 4.0–10.5)

## 2018-08-04 LAB — URIC ACID: URIC ACID, SERUM: 5.6 mg/dL (ref 2.5–7.1)

## 2018-08-04 NOTE — Assessment & Plan Note (Signed)
She has symptomatic improvement since we start her on Magic mouthwash She will complete a week course of therapy before resuming the rest of her chemotherapy next week

## 2018-08-04 NOTE — Progress Notes (Signed)
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[facility]) She complains of recent yeast infection which has resolved with Magic mouthwash Repeat blood work show no significant signs of pancytopenia I recommend holding treatment for a week until completion of therapy She will resume her chemotherapy around November 1 and to resume her current course of treatment Once her treatment is completed, she will take 7 days of break before resume next cycle of therapy  Oral thrush She has symptomatic improvement since we start her on Magic mouthwash She will complete a week course of therapy before resuming the rest of her chemotherapy next week   No orders of the defined types were placed in this encounter.   INTERVAL HISTORY: Please see below for problem oriented charting. She is seen urgently per patient's husband request for urgent evaluation For about the last few days, she started to complain of severe mucositis along with sore throat She denies fever, chills or cough Her husband noted that her mouth is full of yeast infection resembling cottage cheese We prescribed Magic mouthwash solution for her Since then, it has improved She denies other infection such as diarrhea, cough, dysuria, frequency or urgency No new lymphadenopathy She has approximately 10 more days of Revlimid for current cycle of therapy  SUMMARY OF ONCOLOGIC HISTORY: Oncology History   High IPI score      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[place])   11/27/2007 Initial Diagnosis    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 She was treated with chlorambucil in 2000.   --2007 - 09/2008.  Treated with  single agent Rituxan.   --12/2008.  She had progressive adenopathy in chest and abdomen and repeat biopsy showed low grade follicular and diffuse B cell NHL.   --01/2009 - 07/2009. She required left ureteral stent and was treated with Treanda/Rituxan x 5 cycles from 02-2009 thru 06-2009.     09/16/2014 Imaging    No lymphadenopathy in the chest, abdomen, or pelvis. The small right axillary lymph nodes seen on the previous exam are unchanged in the 18 month interval. Small to moderate hiatal hernia.    11/20/2017 Imaging    Mildly displaced and angulated pathologic fracture of the proximal humeral shaft. 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     11/22/2017 PET scan    New hypermetabolic lymphadenopathy within the chest and abdomen, consistent with recurrent lymphoma. New large hypermetabolic mass in the right hepatic lobe, consistent with lymphoma this involvement. New small hypermetabolic mass in the left kidney, which may be due to lymphomatous involvement although primary renal cell carcinoma cannot be excluded. Hypermetabolic mass in the right humerus with pathologic fracture, consistent with lymphomatous involvement. Small hypermetabolic foci in right posterior chest wall, also suspicious for recurrent lymphoma.    11/23/2017 Procedure    Bone, fragment(s), Right Humeral Shaft Reamings - ATYPICAL LYMPHOID INFILTRATE CONSISTENT WITH NON-HODGKIN'S B CELL LYMPHOMA. - SEE ONCOLOGY TABLE. Microscopic Comment LYMPHOMA Histologic type: Non-Hodgkins lymphoma, favor large cell type. Grade (if applicable): Favor high grade. Flow cytometry: No specimen is available for analysis. Immunohistochemical stains: BCL-2, BCL-6, CD3, CD5, CD10, CD15, CD20, CD30, CD34, CD43, LCA, CD79a, CD138, Ki-67, PAX-5 and TdT with appropriate controls as  performed on block 1C. Touch preps/imprints: Not performed. Comments: The sections show multiple, primarily soft tissue fragments  displaying a dense lymphoid infiltrate characterized by a mixture of small round to slightly irregular lymphocytes, histiocytes, and large centroblastic appearing lymphoid cells with partially clumped to vesicular chromatin, small nucleoli and amphophilic to clear cytoplasm. The number of admixed large lymphoid cells is variable but they appear relatively abundant in some areas associated with clustering. No material is available for flow cytometric analysis. Hence, a large batter of immunohistochemical stains were performed and show a mixture of T and B cells but with a significant B cell component mostly composed of large lymphoid cells with positivity for CD79a, PAX-5, CD30, BCL-2, and BCL-6. LCA shows diffuse staining although some of the large lymphoid cells appear negative. No significant staining is seen with CD20, CD10, CD15, CD34, TdT, or CD138. The lack of CD20 expression in this material is possibly related to prior treatment. There is variable increased expression of Ki-67 ranging from 10 to >50% in some areas particularly where there is abundance of large lymphoid cells. There is admixed abundant T cell population in the background as seen with CD3, CD5, and CD43 with on apparent co-expression of CD5 in B cell areas. The overall findings are consistent with involvement by non-Hodgkin's B cell lymphoma and the presence of relative abundance of large lymphoid B-cells and increased Ki-67 expression favor a high grade large B cell lymphoma.    11/23/2017 Surgery    1. CPT 24516-Intramedullary nailing of right humerus fracture 2. CPT 20245-Bone biopsy, deep      11/23/2017 - 11/25/2017 Hospital Admission    She was admitted to the hospital for management of humerus fracture and aggressive lymphoma    11/24/2017 Imaging    ECHO showed LV EF: 65% -   70%    11/25/2017 Procedure    Status post ultrasound-guided biopsy of liver mass. Tissue specimen sent to pathology for complete histopathologic  analysis    12/07/2017 Imaging    Successful placement of a right internal jugular approach power injectable Port-A-Cath. The catheter is ready for immediate use.    12/09/2017 - 03/29/2018 Chemotherapy    The patient had R-CHOP chemo      12/17/2017 - 12/20/2017 Hospital Admission    She was hospitalized for influenza infection    01/03/2018 Procedure    Unsuccessful initial L4-5 LP. Successful L2-3 lumbar puncture with installation of methotrexate.    01/24/2018 Procedure    Non complicated injection of intrathecal methotrexate, as above.    02/09/2018 Imaging    LV EF: 55% -  60%    02/10/2018 PET scan    1. Interval response to therapy. There has been resolution of previous hypermetabolic tumor within the chest and abdomen. Deauville criteria 2 and 3. 2. Diffuse radiotracer uptake is identified within the axial and appendicular skeleton which is favored to represent treatment related changes. 3. Significant decrease in FDG uptake associated with previously noted hypermetabolic tumor involving the proximal humeral diaphysis. The FDG uptake within this area on today's study is equivalent to that which is seen in the lumbar spine. 4. Aortic Atherosclerosis (ICD10-I70.0). Lad coronary artery calcifications. 5. Hiatal hernia    02/15/2018 Procedure    Fluoroscopic guided lumbar puncture and intrathecal injection of chemotherapy without complication.    03/31/2018 Procedure    Technically successful fluoroscopic guided lumbar puncture for intrathecal methotrexate injection. Procedure was well tolerated without evidence for immediate complications. Patient will recover in short stay prior  to being discharged home    05/08/2018 PET scan    No residual/persistent enlarged or hypermetabolic lymph nodes in the neck, chest, abdomen or pelvis. Findings suggest an excellent response to treatment.    05/25/2018 -  Chemotherapy    The patient had Revlimid for maintenance chemo     06/07/2018 Imaging     LV EF: 55% -  60%     REVIEW OF SYSTEMS:   Constitutional: Denies fevers, chills or abnormal weight loss Eyes: Denies blurriness of vision Respiratory: Denies cough, dyspnea or wheezes Cardiovascular: Denies palpitation, chest discomfort or lower extremity swelling Gastrointestinal:  Denies nausea, heartburn or change in bowel habits Skin: Denies abnormal skin rashes Lymphatics: Denies new lymphadenopathy or easy bruising Neurological:Denies numbness, tingling or new weaknesses Behavioral/Psych: Mood is stable, no new changes  All other systems were reviewed with the patient and are negative.  I have reviewed the past medical history, past surgical history, social history and family history with the patient and they are unchanged from previous note.  ALLERGIES:  is allergic to contrast media [iodinated diagnostic agents]; iohexol; povidone-iodine; penicillins; and adhesive [tape].  MEDICATIONS:  Current Outpatient Medications  Medication Sig Dispense Refill  . acetaminophen (TYLENOL) 325 MG tablet Take 2 tablets (650 mg total) by mouth every 6 (six) hours as needed for mild pain (or Fever >/= 101). (Patient not taking: Reported on 04/19/2018)    . allopurinol (ZYLOPRIM) 300 MG tablet Take 1 tablet (300 mg total) by mouth daily. 30 tablet 1  . aspirin 81 MG tablet Take 81 mg by mouth daily.    . cholecalciferol (VITAMIN D) 1000 units tablet Take 2,000 Units by mouth daily.    . citalopram (CELEXA) 40 MG tablet Take 1 tablet (40 mg total) by mouth daily. **DUE FOR PHYSICAL WITH CORY** 30 tablet 0  . fluticasone furoate-vilanterol (BREO ELLIPTA) 100-25 MCG/INH AEPB Inhale 1 puff into the lungs every morning.    Marland Kitchen lenalidomide (REVLIMID) 15 MG capsule Take 1 capsule (3m) by mouth once daily for 21 days on, 7 days off, repeat every 28 days 21 capsule 0  . magic mouthwash w/lidocaine SOLN Take 5 mLs by mouth 4 (four) times daily. 240 mL 0  . pantoprazole (PROTONIX) 40 MG tablet Take 1  tablet (40 mg total) by mouth daily. 180 tablet 3   No current facility-administered medications for this visit.     PHYSICAL EXAMINATION: ECOG PERFORMANCE STATUS: 1 - Symptomatic but completely ambulatory  Vitals:   08/04/18 0903  BP: (!) 106/50  Pulse: 88  Resp: 18  Temp: 97.9 F (36.6 C)  SpO2: 97%   Filed Weights   08/04/18 0903  Weight: 114 lb 6.4 oz (51.9 kg)    GENERAL:alert, no distress and comfortable SKIN: skin color, texture, turgor are normal, no rashes or significant lesions EYES: normal, Conjunctiva are pink and non-injected, sclera clear OROPHARYNX:no exudate, no erythema and lips, buccal mucosa, and tongue normal.  I did not appreciate any signs of active yeast infection or open sores in her mouth NECK: supple, thyroid normal size, non-tender, without nodularity LYMPH:  no palpable lymphadenopathy in the cervical, axillary or inguinal LUNGS: clear to auscultation and percussion with normal breathing effort HEART: regular rate & rhythm and no murmurs and no lower extremity edema ABDOMEN:abdomen soft, non-tender and normal bowel sounds Musculoskeletal:no cyanosis of digits and no clubbing  NEURO: alert & oriented x 3 with fluent speech, no focal motor/sensory deficits  LABORATORY DATA:  I have reviewed the  data as listed    Component Value Date/Time   NA 144 08/04/2018 0939   NA 142 12/28/2016 1002   K 3.6 08/04/2018 0939   K 4.6 12/28/2016 1002   CL 104 08/04/2018 0939   CL 101 03/28/2013 0826   CO2 30 08/04/2018 0939   CO2 29 12/28/2016 1002   GLUCOSE 84 08/04/2018 0939   GLUCOSE 85 12/28/2016 1002   GLUCOSE 84 03/28/2013 0826   BUN 12 08/04/2018 0939   BUN 20.3 12/28/2016 1002   CREATININE 0.85 08/04/2018 0939   CREATININE 0.9 12/28/2016 1002   CALCIUM 9.3 08/04/2018 0939   CALCIUM 10.1 12/28/2016 1002   PROT 5.9 (L) 08/04/2018 0939   PROT 6.8 12/28/2016 1002   ALBUMIN 3.6 08/04/2018 0939   ALBUMIN 4.2 12/28/2016 1002   AST 18 08/04/2018  0939   AST 20 12/28/2016 1002   ALT 18 08/04/2018 0939   ALT 12 12/28/2016 1002   ALKPHOS 119 08/04/2018 0939   ALKPHOS 111 12/28/2016 1002   BILITOT 0.7 08/04/2018 0939   BILITOT 0.55 12/28/2016 1002   GFRNONAA >60 08/04/2018 0939   GFRAA >60 08/04/2018 0939    No results found for: SPEP, UPEP  Lab Results  Component Value Date   WBC 4.6 08/04/2018   NEUTROABS 2.7 08/04/2018   HGB 12.6 08/04/2018   HCT 40.1 08/04/2018   MCV 103.4 (H) 08/04/2018   PLT 186 08/04/2018      Chemistry      Component Value Date/Time   NA 144 08/04/2018 0939   NA 142 12/28/2016 1002   K 3.6 08/04/2018 0939   K 4.6 12/28/2016 1002   CL 104 08/04/2018 0939   CL 101 03/28/2013 0826   CO2 30 08/04/2018 0939   CO2 29 12/28/2016 1002   BUN 12 08/04/2018 0939   BUN 20.3 12/28/2016 1002   CREATININE 0.85 08/04/2018 0939   CREATININE 0.9 12/28/2016 1002      Component Value Date/Time   CALCIUM 9.3 08/04/2018 0939   CALCIUM 10.1 12/28/2016 1002   ALKPHOS 119 08/04/2018 0939   ALKPHOS 111 12/28/2016 1002   AST 18 08/04/2018 0939   AST 20 12/28/2016 1002   ALT 18 08/04/2018 0939   ALT 12 12/28/2016 1002   BILITOT 0.7 08/04/2018 0939   BILITOT 0.55 12/28/2016 1002      All questions were answered. The patient knows to call the clinic with any problems, questions or concerns. No barriers to learning was detected.  I spent 15 minutes counseling the patient face to face. The total time spent in the appointment was 20 minutes and more than 50% was on counseling and review of test results  [name], MD 08/04/2018 3:56 PM

## 2018-08-04 NOTE — Assessment & Plan Note (Signed)
She complains of recent yeast infection which has resolved with Magic mouthwash Repeat blood work show no significant signs of pancytopenia I recommend holding treatment for a week until completion of therapy She will resume her chemotherapy around November 1 and to resume her current course of treatment Once her treatment is completed, she will take 7 days of break before resume next cycle of therapy

## 2018-08-04 NOTE — Telephone Encounter (Signed)
Pt sched per 10/25 los

## 2018-08-04 NOTE — Telephone Encounter (Signed)
Called and given below message. She verbalized understanding. 

## 2018-08-04 NOTE — Telephone Encounter (Signed)
-----   Message from [name], MD sent at 08/04/2018  1:46 PM EDT ----- Regarding: call husband Let him know all labs are good ----- Message ----- From: Interface, Lab In [place] Sent: 08/04/2018   9:50 AM EDT To: [name], MD

## 2018-08-18 NOTE — Telephone Encounter (Signed)
Oral Oncology Patient Advocate Encounter  I have not heard from the patient so I called her again today and left a voicemail on both numbers listed.  Will continue to update  [place] Patient [place] Phone [phone] Fax [phone]

## 2018-08-18 NOTE — Telephone Encounter (Signed)
Oral Oncology Patient Advocate Encounter  Brooke Washington returned my call and she will coming in on Monday 11/11 to bring 2018 tax return and sign the [employer] application.  Will continue to update  [place] Patient [place] Phone [phone] Fax [phone]

## 2018-08-23 NOTE — Telephone Encounter (Signed)
Oral Oncology Patient Advocate Encounter  Mr. Zirkle emailed me the income information and I faxed it to [place].  This encounter will be updated until final determination  [place] [facility] [place] Phone [phone] Fax [phone]

## 2018-09-08 ENCOUNTER — Other Ambulatory Visit: Payer: Self-pay | Admitting: *Deleted

## 2018-09-08 DIAGNOSIS — C8338 Diffuse large B-cell lymphoma, lymph nodes of multiple sites: Secondary | ICD-10-CM

## 2018-09-08 MED ORDER — LENALIDOMIDE 15 MG PO CAPS: ORAL_CAPSULE | ORAL | 0 refills | Status: DC

## 2018-09-13 ENCOUNTER — Encounter: Payer: Self-pay | Admitting: Hematology and Oncology

## 2018-09-15 IMAGING — CT NM PET TUM IMG RESTAG (PS) SKULL BASE T - THIGH
8 series · 21 of 25 positions shown · non-contrast
Comparison: Multiple prior PET CTs.  The most recent is 02/10/2018

CLINICAL DATA: Subsequent treatment strategy for lymphoma.

EXAM:
NUCLEAR MEDICINE PET SKULL BASE TO THIGH
TECHNIQUE: 5.69 mCi F-18 FDG was injected intravenously. Full-ring PET imaging
was performed from the skull base to thigh after the radiotracer. CT
data was obtained and used for attenuation correction and anatomic
localization.
Fasting blood glucose: 94 mg/dl

[Series 4: pet sk_thigh ac · axial · 2.0mm · 4.07mm/px · z∈[-1329,-483]mm · 7 of 419 slices shown]
[im 1/419]
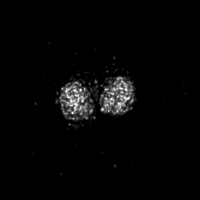
[im 60/419]
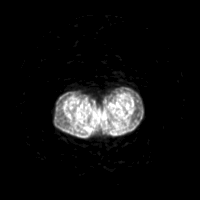
[im 120/419]
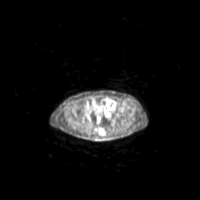
[im 239/419]
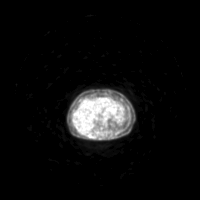
[im 299/419]
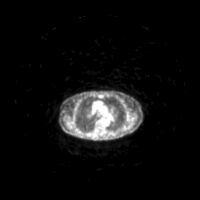
[im 359/419]
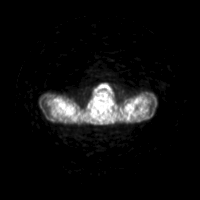
[im 419/419]
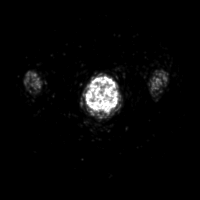

[Series 5: ct sk_thigh 5.0 b31f · axial · 5.0mm · 0.98mm/px · z∈[-1330,-482]mm · 3 of 213 slices shown]
[im 1/213]
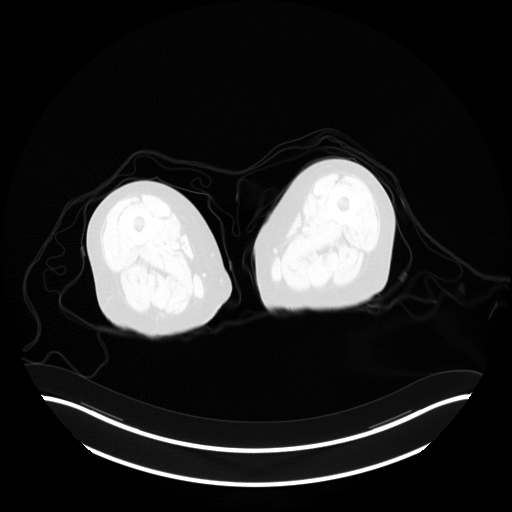
[im 142/213]
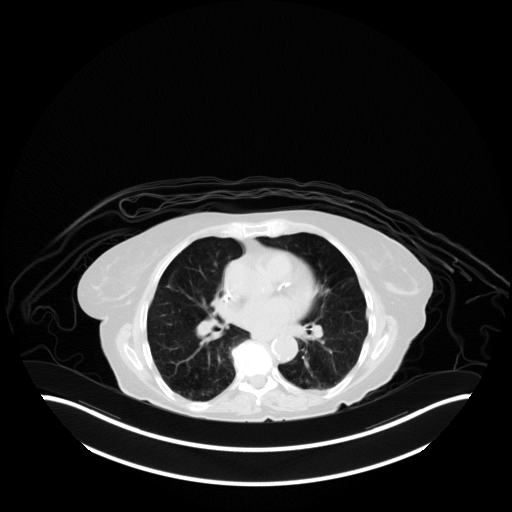
[im 213/213  brain]
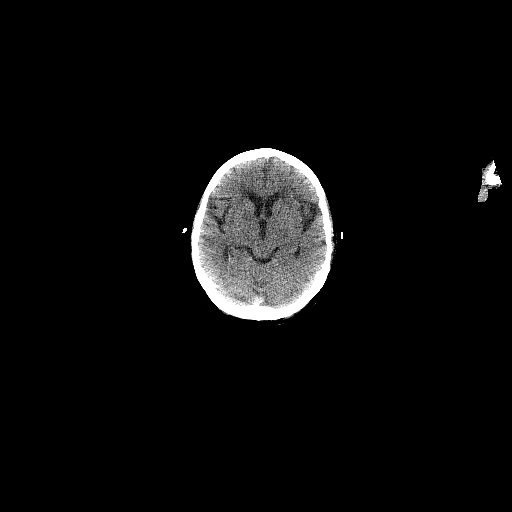

[Series 6: pet sk_thigh nac · axial · 5.0mm · 4.07mm/px · z∈[-1330,-766]mm · 3 of 213 slices shown]
[im 1/213]
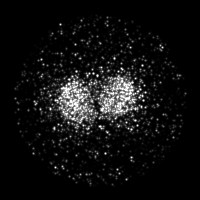
[im 71/213]
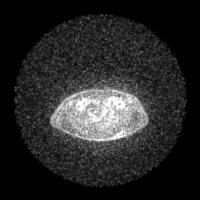
[im 142/213]
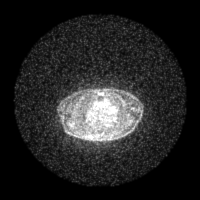

[Series 8: ct sk_thigh 5.0 b70f lung_bone · axial · 5.0mm · 0.58mm/px · 1 of 68 slices shown]
[im 1/68  bone]
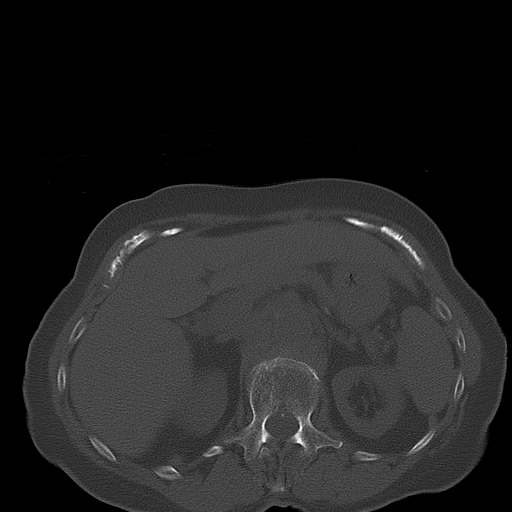

[Series 603: range-ct sk_thigh 5.0 (id)<alpha range> · 2 of 75 slices shown (1 of 2)]
[im 1/75]
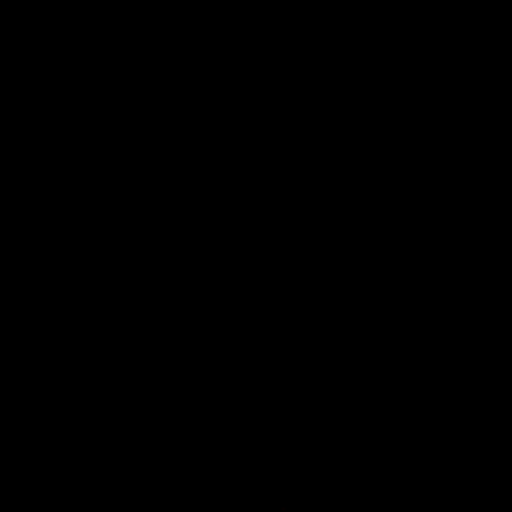
[im 75/75]
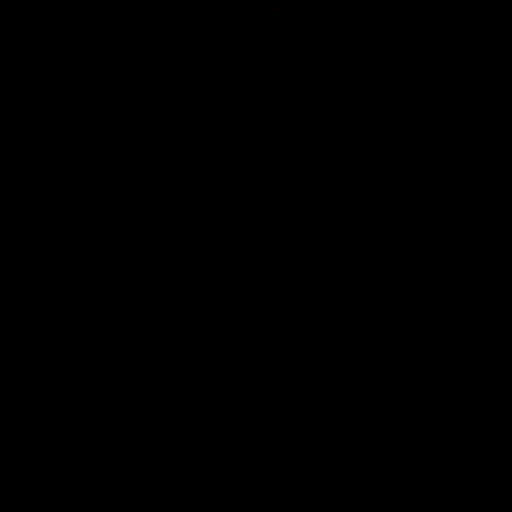

[Series 604: mip range 3 · coronal · 1.75mm/px · 1 of 32 slices shown]
[im 1/32]
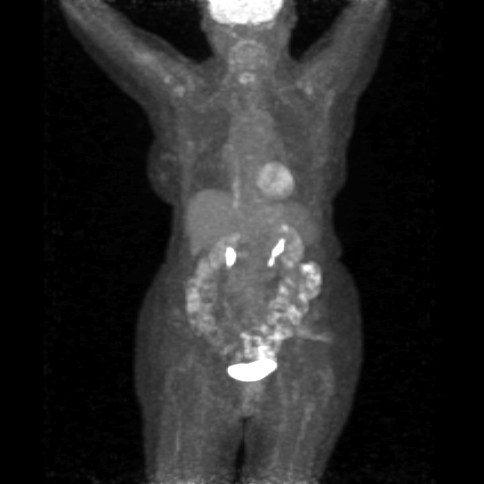

[Series 605: range-ct sk_thigh 5.0 (id)<alpha range> · 3 of 195 slices shown (2 of 2)]
[im 1/195]
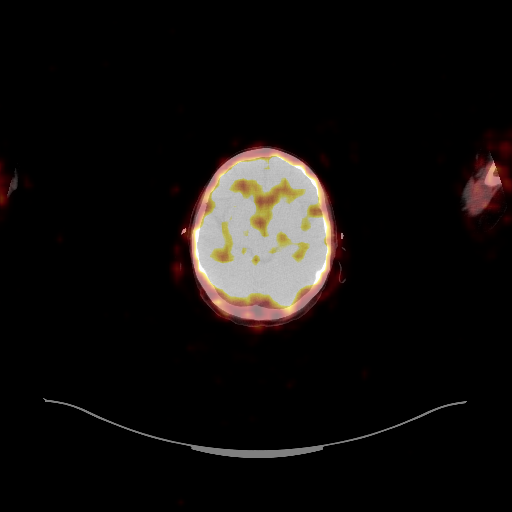
[im 130/195]
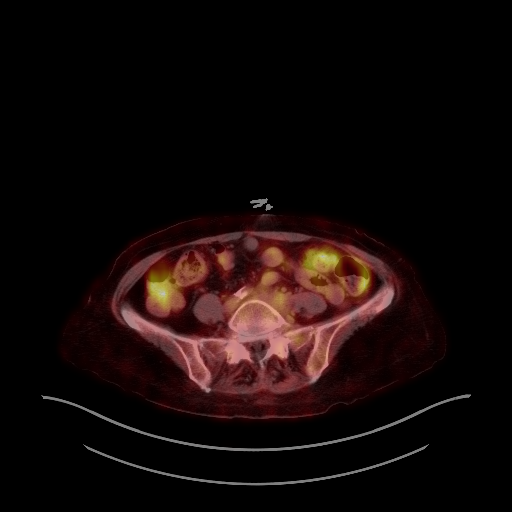
[im 195/195]
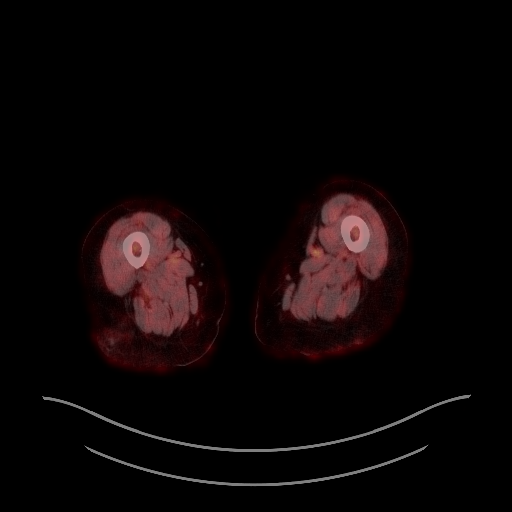

[Series 1379: results mm oncology reading · 5.0mm · 0.50mm/px · 1 of 1 slices shown]
[im 1/1]
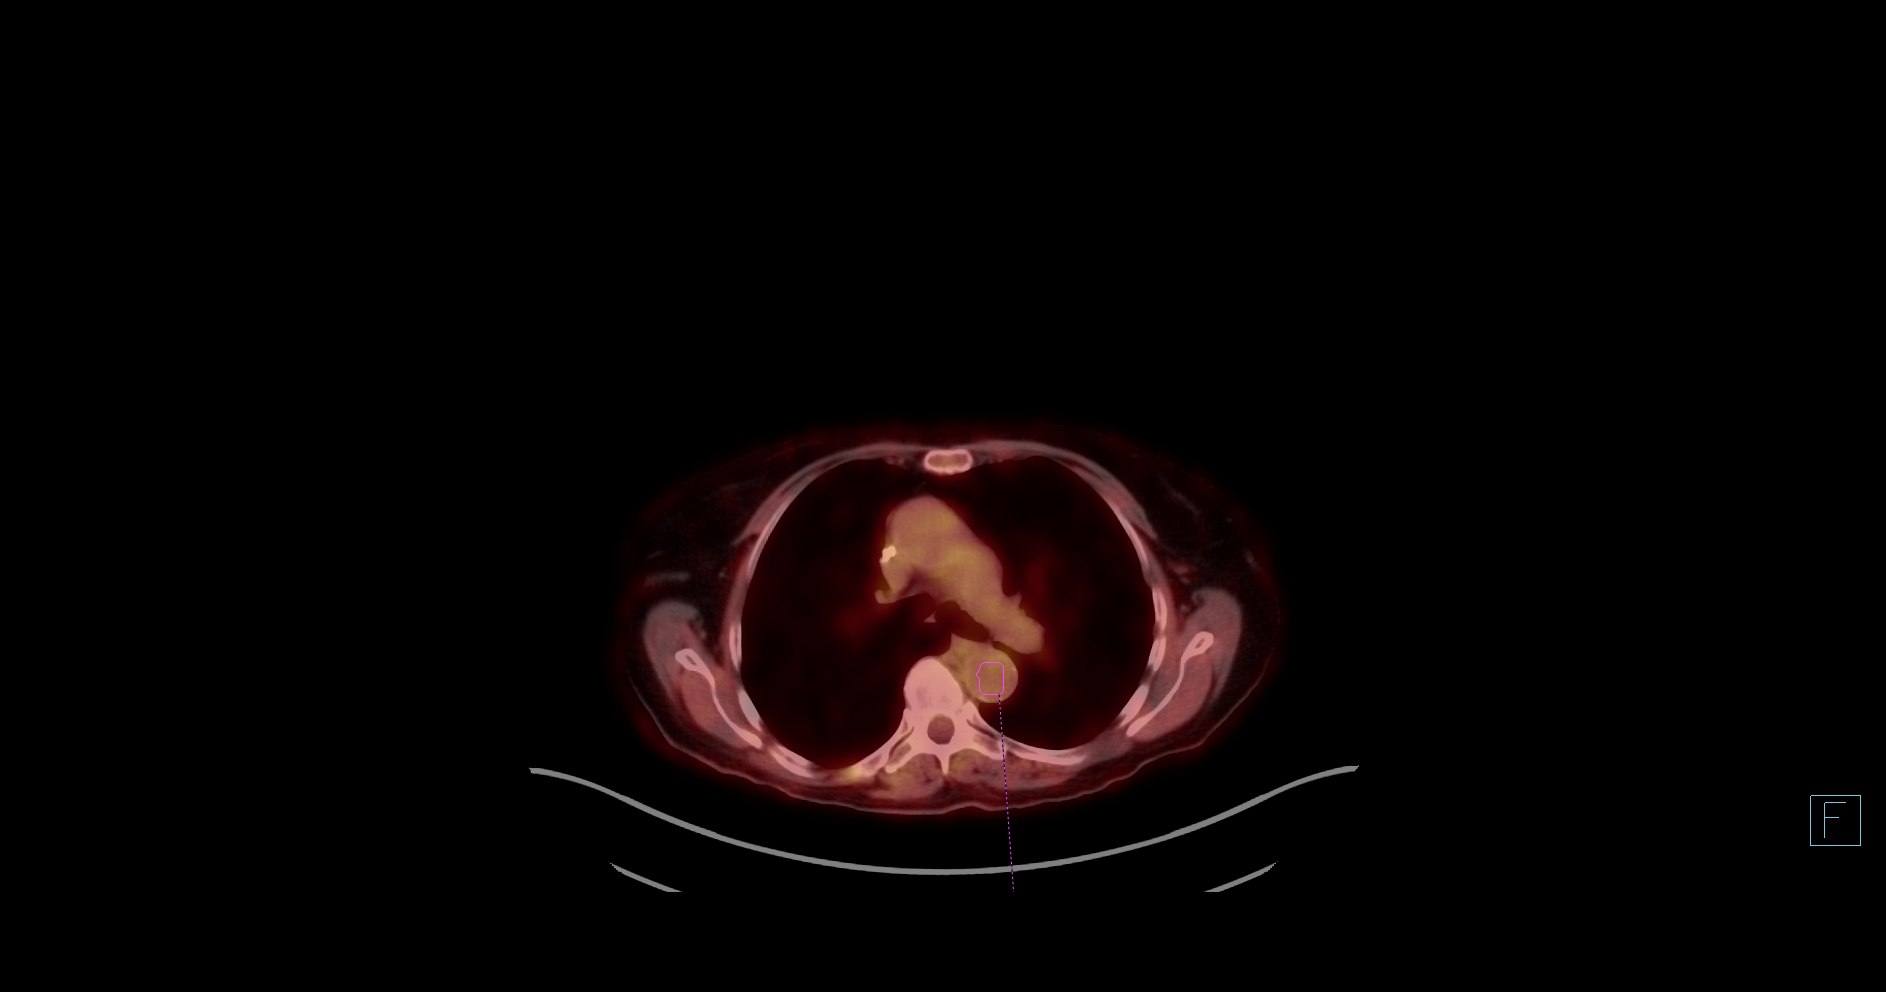

[21 of 25 positions shown; findings below may reference images not displayed]

FINDINGS: Mediastinal blood pool activity: SUV max

NECK: No hypermetabolic lymph nodes in the neck.

Incidental CT findings: Advanced atherosclerotic calcifications
involving the carotid arteries.

CHEST: No hypermetabolic mediastinal or hilar nodes. No suspicious
pulmonary nodules on the CT scan.

Incidental CT findings: Stable emphysematous changes and areas of
pulmonary scarring. Stable advanced atherosclerotic calcifications
involving the thoracic aorta and coronary arteries. Moderate-sized
hiatal hernia.

ABDOMEN/PELVIS: No abnormal hypermetabolic activity within the
liver, pancreas, adrenal glands, or spleen. No hypermetabolic lymph
nodes in the abdomen or pelvis.

No enlarged or hypermetabolic inguinal lymph nodes.

Incidental CT findings: Stable advanced atherosclerotic
calcifications involving the aorta and iliac arteries.

SKELETON: No focal hypermetabolic activity to suggest skeletal
metastasis.

Incidental CT findings: Right humeral rod in place. Stable sclerotic
lesion in the right ischial tuberosity and also in region of the
right greater trochanter.
IMPRESSION: No residual/persistent enlarged or hypermetabolic lymph nodes in the
neck, chest, abdomen or pelvis. Findings suggest an excellent
response to treatment.

## 2018-09-18 ENCOUNTER — Telehealth: Payer: Self-pay | Admitting: Hematology and Oncology

## 2018-09-18 ENCOUNTER — Other Ambulatory Visit: Payer: Self-pay | Admitting: Adult Health

## 2018-09-18 ENCOUNTER — Inpatient Hospital Stay: Payer: Medicare Other | Attending: Hematology and Oncology

## 2018-09-18 ENCOUNTER — Encounter: Payer: Self-pay | Admitting: Hematology and Oncology

## 2018-09-18 ENCOUNTER — Inpatient Hospital Stay (HOSPITAL_BASED_OUTPATIENT_CLINIC_OR_DEPARTMENT_OTHER): Payer: Medicare Other | Admitting: Hematology and Oncology

## 2018-09-18 ENCOUNTER — Inpatient Hospital Stay: Payer: Medicare Other

## 2018-09-18 VITALS — BP 109/61 | HR 72 | Temp 97.2°F | Resp 18 | Ht 64.0 in | Wt 113.8 lb

## 2018-09-18 DIAGNOSIS — M79602 Pain in left arm: Secondary | ICD-10-CM | POA: Diagnosis not present

## 2018-09-18 DIAGNOSIS — R634 Abnormal weight loss: Secondary | ICD-10-CM | POA: Insufficient documentation

## 2018-09-18 DIAGNOSIS — C8338 Diffuse large B-cell lymphoma, lymph nodes of multiple sites: Secondary | ICD-10-CM | POA: Diagnosis not present

## 2018-09-18 DIAGNOSIS — K1231 Oral mucositis (ulcerative) due to antineoplastic therapy: Secondary | ICD-10-CM | POA: Insufficient documentation

## 2018-09-18 DIAGNOSIS — C8588 Other specified types of non-Hodgkin lymphoma, lymph nodes of multiple sites: Secondary | ICD-10-CM

## 2018-09-18 DIAGNOSIS — T451X5A Adverse effect of antineoplastic and immunosuppressive drugs, initial encounter: Secondary | ICD-10-CM | POA: Insufficient documentation

## 2018-09-18 DIAGNOSIS — D61810 Antineoplastic chemotherapy induced pancytopenia: Secondary | ICD-10-CM | POA: Insufficient documentation

## 2018-09-18 DIAGNOSIS — D61818 Other pancytopenia: Secondary | ICD-10-CM

## 2018-09-18 LAB — CBC WITH DIFFERENTIAL (CANCER CENTER ONLY)
ABS IMMATURE GRANULOCYTES: 0.01 10*3/uL (ref 0.00–0.07)
Basophils Absolute: 0.1 10*3/uL (ref 0.0–0.1)
Basophils Relative: 2 %
EOS PCT: 25 %
Eosinophils Absolute: 0.7 10*3/uL — ABNORMAL HIGH (ref 0.0–0.5)
HCT: 35.4 % — ABNORMAL LOW (ref 36.0–46.0)
HEMOGLOBIN: 11.5 g/dL — AB (ref 12.0–15.0)
Immature Granulocytes: 0 %
LYMPHS PCT: 28 %
Lymphs Abs: 0.8 10*3/uL (ref 0.7–4.0)
MCH: 33.2 pg (ref 26.0–34.0)
MCHC: 32.5 g/dL (ref 30.0–36.0)
MCV: 102.3 fL — AB (ref 80.0–100.0)
MONO ABS: 0.5 10*3/uL (ref 0.1–1.0)
MONOS PCT: 17 %
NRBC: 0 % (ref 0.0–0.2)
Neutro Abs: 0.8 10*3/uL — ABNORMAL LOW (ref 1.7–7.7)
Neutrophils Relative %: 28 %
Platelet Count: 152 10*3/uL (ref 150–400)
RBC: 3.46 MIL/uL — AB (ref 3.87–5.11)
RDW: 15.6 % — ABNORMAL HIGH (ref 11.5–15.5)
WBC: 2.8 10*3/uL — AB (ref 4.0–10.5)

## 2018-09-18 LAB — CMP (CANCER CENTER ONLY)
ALT: 12 U/L (ref 0–44)
AST: 16 U/L (ref 15–41)
Albumin: 3.6 g/dL (ref 3.5–5.0)
Alkaline Phosphatase: 116 U/L (ref 38–126)
Anion gap: 10 (ref 5–15)
BUN: 16 mg/dL (ref 8–23)
CHLORIDE: 106 mmol/L (ref 98–111)
CO2: 26 mmol/L (ref 22–32)
CREATININE: 0.84 mg/dL (ref 0.44–1.00)
Calcium: 9.2 mg/dL (ref 8.9–10.3)
Glucose, Bld: 70 mg/dL (ref 70–99)
POTASSIUM: 3.5 mmol/L (ref 3.5–5.1)
Sodium: 142 mmol/L (ref 135–145)
Total Bilirubin: 0.7 mg/dL (ref 0.3–1.2)
Total Protein: 5.7 g/dL — ABNORMAL LOW (ref 6.5–8.1)

## 2018-09-18 LAB — URIC ACID: URIC ACID, SERUM: 5.7 mg/dL (ref 2.5–7.1)

## 2018-09-18 MED ORDER — SODIUM CHLORIDE 0.9% FLUSH
10.0000 mL | Freq: Once | INTRAVENOUS | Status: AC
  Administered 2018-09-18: 10 mL
  Filled 2018-09-18: qty 10

## 2018-09-18 MED ORDER — HEPARIN SOD (PORK) LOCK FLUSH 100 UNIT/ML IV SOLN
500.0000 [IU] | Freq: Once | INTRAVENOUS | Status: AC
  Administered 2018-09-18: 500 [IU]
  Filled 2018-09-18: qty 5

## 2018-09-18 NOTE — Assessment & Plan Note (Signed)
Due to recent treatment.  She is currently on her week break I plan to recheck her blood count next week.  We discussed potential dose reduction and she appears interested with that.

## 2018-09-18 NOTE — Progress Notes (Signed)
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  She is currently on her week break I plan to recheck her blood count next week.  We discussed potential dose reduction and she appears interested with that.  Mucositis due to antineoplastic therapy I did not see any blisters She will continue Magic mouthwash as needed and will continue hold treatment  Weight loss She has recent mild progressive weight loss despite eating adequately I will check her TSH in her next blood draw along with imaging study to exclude cancer recurrence.   Orders Placed This Encounter  Procedures  . NM PET Image Restag (PS) Skull Base To Thigh    Standing Status:   Future    Standing Expiration Date:   09/19/2019    Order Specific Question:   If indicated for the ordered procedure, I authorize the administration of a radiopharmaceutical per Radiology protocol    Answer:   Yes    Order Specific Question:   Preferred imaging location?    Answer:   [facility]    Order Specific Question:   Radiology Contrast Protocol - do NOT remove file path    Answer:   [email or web address]  . T4, free    Standing Status:   Future    Standing Expiration Date:   10/23/2019  . TSH    Standing Status:   Future    Standing Expiration Date:   10/23/2019    INTERVAL HISTORY: Please see below for problem oriented charting. She returns with her husband for further follow-up She just completed her last cycle of chemotherapy last 'Sunday. She complained of  recurrent mouth sores.  She has lost some weight She also complained of intermittent bone pain especially at the site of previous pathological fracture.  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 She was treated with chlorambucil in 2000.   --2007 - 09/2008.  Treated with single agent Rituxan.   --12/2008.  She had progressive adenopathy in chest and abdomen and repeat biopsy showed low grade follicular and diffuse B cell NHL.   --01/2009 - 07/2009. She required left ureteral stent and was treated with Treanda/Rituxan x 5 cycles from 02-2009 thru 06-2009.     12'$ /04/2014 Imaging    No lymphadenopathy in the chest, abdomen, or pelvis. The small right axillary lymph nodes seen on the previous exam are unchanged in the 18 month interval. Small to moderate hiatal hernia.    11/20/2017 Imaging    Mildly displaced and angulated pathologic fracture of the proximal humeral shaft. 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     11/22/2017 PET scan  New hypermetabolic lymphadenopathy within the chest and abdomen, consistent with recurrent lymphoma. New large hypermetabolic mass in the right hepatic lobe, consistent with lymphoma this involvement. New small hypermetabolic mass in the left kidney, which may be due to lymphomatous involvement although primary renal cell carcinoma cannot be excluded. Hypermetabolic mass in the right humerus with pathologic fracture, consistent with lymphomatous involvement. Small  hypermetabolic foci in right posterior chest wall, also suspicious for recurrent lymphoma.    11/23/2017 Procedure    Bone, fragment(s), Right Humeral Shaft Reamings - ATYPICAL LYMPHOID INFILTRATE CONSISTENT WITH NON-HODGKIN'S B CELL LYMPHOMA. - SEE ONCOLOGY TABLE. Microscopic Comment LYMPHOMA Histologic type: Non-Hodgkins lymphoma, favor large cell type. Grade (if applicable): Favor high grade. Flow cytometry: No specimen is available for analysis. Immunohistochemical stains: BCL-2, BCL-6, CD3, CD5, CD10, CD15, CD20, CD30, CD34, CD43, LCA, CD79a, CD138, Ki-67, PAX-5 and TdT with appropriate controls as performed on block 1C. Touch preps/imprints: Not performed. Comments: The sections show multiple, primarily soft tissue fragments displaying a dense lymphoid infiltrate characterized by a mixture of small round to slightly irregular lymphocytes, histiocytes, and large centroblastic appearing lymphoid cells with partially clumped to vesicular chromatin, small nucleoli and amphophilic to clear cytoplasm. The number of admixed large lymphoid cells is variable but they appear relatively abundant in some areas associated with clustering. No material is available for flow cytometric analysis. Hence, a large batter of immunohistochemical stains were performed and show a mixture of T and B cells but with a significant B cell component mostly composed of large lymphoid cells with positivity for CD79a, PAX-5, CD30, BCL-2, and BCL-6. LCA shows diffuse staining although some of the large lymphoid cells appear negative. No significant staining is seen with CD20, CD10, CD15, CD34, TdT, or CD138. The lack of CD20 expression in this material is possibly related to prior treatment. There is variable increased expression of Ki-67 ranging from 10 to >50% in some areas particularly where there is abundance of large lymphoid cells. There is admixed abundant T cell population in the background as seen with CD3, CD5, and CD43  with on apparent co-expression of CD5 in B cell areas. The overall findings are consistent with involvement by non-Hodgkin's B cell lymphoma and the presence of relative abundance of large lymphoid B-cells and increased Ki-67 expression favor a high grade large B cell lymphoma.    11/23/2017 Surgery    1. CPT 24516-Intramedullary nailing of right humerus fracture 2. CPT 20245-Bone biopsy, deep      11/23/2017 - 11/25/2017 Hospital Admission    She was admitted to the hospital for management of humerus fracture and aggressive lymphoma    11/24/2017 Imaging    ECHO showed LV EF: 65% -   70%    11/25/2017 Procedure    Status post ultrasound-guided biopsy of liver mass. Tissue specimen sent to pathology for complete histopathologic analysis    12/07/2017 Imaging    Successful placement of a right internal jugular approach power injectable Port-A-Cath. The catheter is ready for immediate use.    12/09/2017 - 03/29/2018 Chemotherapy    The patient had R-CHOP chemo      12/17/2017 - 12/20/2017 Hospital Admission    She was hospitalized for influenza infection    01/03/2018 Procedure    Unsuccessful initial L4-5 LP. Successful L2-3 lumbar puncture with installation of methotrexate.    01/24/2018 Procedure    Non complicated injection of intrathecal methotrexate, as above.    02/09/2018 Imaging    LV EF: 55% -  60%  02/10/2018 PET scan    1. Interval response to therapy. There has been resolution of previous hypermetabolic tumor within the chest and abdomen. Deauville criteria 2 and 3. 2. Diffuse radiotracer uptake is identified within the axial and appendicular skeleton which is favored to represent treatment related changes. 3. Significant decrease in FDG uptake associated with previously noted hypermetabolic tumor involving the proximal humeral diaphysis. The FDG uptake within this area on today's study is equivalent to that which is seen in the lumbar spine. 4. Aortic Atherosclerosis  (ICD10-I70.0). Lad coronary artery calcifications. 5. Hiatal hernia    02/15/2018 Procedure    Fluoroscopic guided lumbar puncture and intrathecal injection of chemotherapy without complication.    03/31/2018 Procedure    Technically successful fluoroscopic guided lumbar puncture for intrathecal methotrexate injection. Procedure was well tolerated without evidence for immediate complications. Patient will recover in short stay prior to being discharged home    05/08/2018 PET scan    No residual/persistent enlarged or hypermetabolic lymph nodes in the neck, chest, abdomen or pelvis. Findings suggest an excellent response to treatment.    05/25/2018 -  Chemotherapy    The patient had Revlimid for maintenance chemo     06/07/2018 Imaging    LV EF: 55% -  60%     REVIEW OF SYSTEMS:   Constitutional: Denies fevers, chills or abnormal weight loss Eyes: Denies blurriness of vision Respiratory: Denies cough, dyspnea or wheezes Cardiovascular: Denies palpitation, chest discomfort or lower extremity swelling Skin: Denies abnormal skin rashes Lymphatics: Denies new lymphadenopathy or easy bruising Neurological:Denies numbness, tingling or new weaknesses Behavioral/Psych: Mood is stable, no new changes  All other systems were reviewed with the patient and are negative.  I have reviewed the past medical history, past surgical history, social history and family history with the patient and they are unchanged from previous note.  ALLERGIES:  is allergic to contrast media [iodinated diagnostic agents]; iohexol; povidone-iodine; penicillins; and adhesive [tape].  MEDICATIONS:  Current Outpatient Medications  Medication Sig Dispense Refill  . acetaminophen (TYLENOL) 325 MG tablet Take 2 tablets (650 mg total) by mouth every 6 (six) hours as needed for mild pain (or Fever >/= 101). (Patient not taking: Reported on 04/19/2018)    . allopurinol (ZYLOPRIM) 300 MG tablet Take 1 tablet (300 mg total) by  mouth daily. 30 tablet 1  . aspirin 81 MG tablet Take 81 mg by mouth daily.    . cholecalciferol (VITAMIN D) 1000 units tablet Take 2,000 Units by mouth daily.    . citalopram (CELEXA) 40 MG tablet Take 1 tablet (40 mg total) by mouth daily. **DUE FOR PHYSICAL WITH CORY** 30 tablet 0  . fluticasone furoate-vilanterol (BREO ELLIPTA) 100-25 MCG/INH AEPB Inhale 1 puff into the lungs every morning.    Marland Kitchen lenalidomide (REVLIMID) 15 MG capsule Take 1 capsule ('15mg'$ ) by mouth once daily for 21 days on, 7 days off, repeat every 28 days 21 capsule 0  . magic mouthwash w/lidocaine SOLN Take 5 mLs by mouth 4 (four) times daily. 240 mL 0  . pantoprazole (PROTONIX) 40 MG tablet Take 1 tablet (40 mg total) by mouth daily. 180 tablet 3   No current facility-administered medications for this visit.     PHYSICAL EXAMINATION: ECOG PERFORMANCE STATUS: 1 - Symptomatic but completely ambulatory  Vitals:   09/18/18 0928  BP: 109/61  Pulse: 72  Resp: 18  Temp: (!) 97.2 F (36.2 C)  SpO2: 95%   Filed Weights   09/18/18 0928  Weight: 113  lb 12.8 oz (51.6 kg)    GENERAL:alert, no distress and comfortable SKIN: skin color, texture, turgor are normal, no rashes or significant lesions EYES: normal, Conjunctiva are pink and non-injected, sclera clear OROPHARYNX:no exudate, no erythema and lips, buccal mucosa, and tongue normal.  No signs of ulcer or thrush NECK: supple, thyroid normal size, non-tender, without nodularity LYMPH:  no palpable lymphadenopathy in the cervical, axillary or inguinal LUNGS: clear to auscultation and percussion with normal breathing effort HEART: regular rate & rhythm and no murmurs and no lower extremity edema ABDOMEN:abdomen soft, non-tender and normal bowel sounds Musculoskeletal:no cyanosis of digits and no clubbing  NEURO: alert & oriented x 3 with fluent speech, no focal motor/sensory deficits  LABORATORY DATA:  I have reviewed the data as listed    Component Value  Date/Time   NA 142 09/18/2018 0907   NA 142 12/28/2016 1002   K 3.5 09/18/2018 0907   K 4.6 12/28/2016 1002   CL 106 09/18/2018 0907   CL 101 03/28/2013 0826   CO2 26 09/18/2018 0907   CO2 29 12/28/2016 1002   GLUCOSE 70 09/18/2018 0907   GLUCOSE 85 12/28/2016 1002   GLUCOSE 84 03/28/2013 0826   BUN 16 09/18/2018 0907   BUN 20.3 12/28/2016 1002   CREATININE 0.84 09/18/2018 0907   CREATININE 0.9 12/28/2016 1002   CALCIUM 9.2 09/18/2018 0907   CALCIUM 10.1 12/28/2016 1002   PROT 5.7 (L) 09/18/2018 0907   PROT 6.8 12/28/2016 1002   ALBUMIN 3.6 09/18/2018 0907   ALBUMIN 4.2 12/28/2016 1002   AST 16 09/18/2018 0907   AST 20 12/28/2016 1002   ALT 12 09/18/2018 0907   ALT 12 12/28/2016 1002   ALKPHOS 116 09/18/2018 0907   ALKPHOS 111 12/28/2016 1002   BILITOT 0.7 09/18/2018 0907   BILITOT 0.55 12/28/2016 1002   GFRNONAA >60 09/18/2018 0907   GFRAA >60 09/18/2018 0907    No results found for: SPEP, UPEP  Lab Results  Component Value Date   WBC 2.8 (L) 09/18/2018   NEUTROABS 0.8 (L) 09/18/2018   HGB 11.5 (L) 09/18/2018   HCT 35.4 (L) 09/18/2018   MCV 102.3 (H) 09/18/2018   PLT 152 09/18/2018      Chemistry      Component Value Date/Time   NA 142 09/18/2018 0907   NA 142 12/28/2016 1002   K 3.5 09/18/2018 0907   K 4.6 12/28/2016 1002   CL 106 09/18/2018 0907   CL 101 03/28/2013 0826   CO2 26 09/18/2018 0907   CO2 29 12/28/2016 1002   BUN 16 09/18/2018 0907   BUN 20.3 12/28/2016 1002   CREATININE 0.84 09/18/2018 0907   CREATININE 0.9 12/28/2016 1002      Component Value Date/Time   CALCIUM 9.2 09/18/2018 0907   CALCIUM 10.1 12/28/2016 1002   ALKPHOS 116 09/18/2018 0907   ALKPHOS 111 12/28/2016 1002   AST 16 09/18/2018 0907   AST 20 12/28/2016 1002   ALT 12 09/18/2018 0907   ALT 12 12/28/2016 1002   BILITOT 0.7 09/18/2018 0907   BILITOT 0.55 12/28/2016 1002      All questions were answered. The patient knows to call the clinic with any problems,  questions or concerns. No barriers to learning was detected.  I spent 25 minutes counseling the patient face to face. The total time spent in the appointment was 30 minutes and more than 50% was on counseling and review of test results  [name], MD 09/18/2018 10:36  AM

## 2018-09-18 NOTE — Telephone Encounter (Signed)
Gave avs and calendar ° °

## 2018-09-18 NOTE — Assessment & Plan Note (Signed)
She has pancytopenia due to recent treatment She has significant recurrent mucositis with each dose of therapy She also complained of recent arm pain I recommend holding her off treatment and plan to repeat imaging study to exclude cancer recurrence

## 2018-09-18 NOTE — Assessment & Plan Note (Signed)
She has recent mild progressive weight loss despite eating adequately I will check her TSH in her next blood draw along with imaging study to exclude cancer recurrence.

## 2018-09-18 NOTE — Assessment & Plan Note (Signed)
I did not see any blisters She will continue Magic mouthwash as needed and will continue hold treatment

## 2018-09-19 NOTE — Telephone Encounter (Signed)
Tommi Rumps, are you filling?

## 2018-09-21 ENCOUNTER — Telehealth: Payer: Self-pay | Admitting: Hematology and Oncology

## 2018-09-21 NOTE — Telephone Encounter (Signed)
R/s appt per 12/12 sch message. - pt is aware of appt date and time

## 2018-09-25 ENCOUNTER — Inpatient Hospital Stay: Payer: Medicare Other

## 2018-09-25 ENCOUNTER — Encounter (HOSPITAL_COMMUNITY)
Admission: RE | Admit: 2018-09-25 | Discharge: 2018-09-25 | Disposition: A | Discharge status: Home / Self Care | Payer: Medicare Other | Source: Ambulatory Visit | Attending: Hematology and Oncology | Admitting: Hematology and Oncology

## 2018-09-25 DIAGNOSIS — C8338 Diffuse large B-cell lymphoma, lymph nodes of multiple sites: Secondary | ICD-10-CM

## 2018-09-25 DIAGNOSIS — C8588 Other specified types of non-Hodgkin lymphoma, lymph nodes of multiple sites: Secondary | ICD-10-CM

## 2018-09-25 DIAGNOSIS — T451X5A Adverse effect of antineoplastic and immunosuppressive drugs, initial encounter: Secondary | ICD-10-CM | POA: Diagnosis not present

## 2018-09-25 DIAGNOSIS — D61810 Antineoplastic chemotherapy induced pancytopenia: Secondary | ICD-10-CM | POA: Diagnosis not present

## 2018-09-25 DIAGNOSIS — C8590 Non-Hodgkin lymphoma, unspecified, unspecified site: Secondary | ICD-10-CM | POA: Diagnosis not present

## 2018-09-25 DIAGNOSIS — K1231 Oral mucositis (ulcerative) due to antineoplastic therapy: Secondary | ICD-10-CM | POA: Diagnosis not present

## 2018-09-25 DIAGNOSIS — R634 Abnormal weight loss: Secondary | ICD-10-CM

## 2018-09-25 DIAGNOSIS — M79602 Pain in left arm: Secondary | ICD-10-CM | POA: Diagnosis not present

## 2018-09-25 LAB — CBC WITH DIFFERENTIAL (CANCER CENTER ONLY)
Abs Immature Granulocytes: 0 10*3/uL (ref 0.00–0.07)
Basophils Absolute: 0.1 10*3/uL (ref 0.0–0.1)
Basophils Relative: 3 %
Eosinophils Absolute: 0.3 10*3/uL (ref 0.0–0.5)
Eosinophils Relative: 10 %
HEMATOCRIT: 33.1 % — AB (ref 36.0–46.0)
Hemoglobin: 11.1 g/dL — ABNORMAL LOW (ref 12.0–15.0)
Immature Granulocytes: 0 %
LYMPHS PCT: 28 %
Lymphs Abs: 1 10*3/uL (ref 0.7–4.0)
MCH: 34.3 pg — ABNORMAL HIGH (ref 26.0–34.0)
MCHC: 33.5 g/dL (ref 30.0–36.0)
MCV: 102.2 fL — ABNORMAL HIGH (ref 80.0–100.0)
MONOS PCT: 18 %
Monocytes Absolute: 0.6 10*3/uL (ref 0.1–1.0)
Neutro Abs: 1.5 10*3/uL — ABNORMAL LOW (ref 1.7–7.7)
Neutrophils Relative %: 41 %
Platelet Count: 206 10*3/uL (ref 150–400)
RBC: 3.24 MIL/uL — ABNORMAL LOW (ref 3.87–5.11)
RDW: 15.9 % — ABNORMAL HIGH (ref 11.5–15.5)
WBC Count: 3.6 10*3/uL — ABNORMAL LOW (ref 4.0–10.5)
nRBC: 0 % (ref 0.0–0.2)

## 2018-09-25 LAB — CMP (CANCER CENTER ONLY)
ALT: 13 U/L (ref 0–44)
AST: 16 U/L (ref 15–41)
Albumin: 3.6 g/dL (ref 3.5–5.0)
Alkaline Phosphatase: 112 U/L (ref 38–126)
Anion gap: 9 (ref 5–15)
BUN: 11 mg/dL (ref 8–23)
CO2: 26 mmol/L (ref 22–32)
Calcium: 8.8 mg/dL — ABNORMAL LOW (ref 8.9–10.3)
Chloride: 110 mmol/L (ref 98–111)
Creatinine: 0.71 mg/dL (ref 0.44–1.00)
GFR, Est AFR Am: >60 mL/min (ref >60)
GFR, Estimated: >60 mL/min (ref >60)
GLUCOSE: 78 mg/dL (ref 70–99)
Potassium: 3.9 mmol/L (ref 3.5–5.1)
SODIUM: 145 mmol/L (ref 135–145)
Total Bilirubin: 0.6 mg/dL (ref 0.3–1.2)
Total Protein: 5.5 g/dL — ABNORMAL LOW (ref 6.5–8.1)

## 2018-09-25 LAB — URIC ACID: Uric Acid, Serum: 4.9 mg/dL (ref 2.5–7.1)

## 2018-09-25 LAB — TSH: TSH: 1.313 u[IU]/mL (ref 0.308–3.960)

## 2018-09-25 LAB — T4, FREE: Free T4: 0.82 ng/dL (ref 0.82–1.77)

## 2018-09-25 LAB — GLUCOSE, CAPILLARY: Glucose-Capillary: 71 mg/dL (ref 70–99)

## 2018-09-25 MED ORDER — FLUDEOXYGLUCOSE F - 18 (FDG) INJECTION
5.5000 | Freq: Once | INTRAVENOUS | Status: AC | PRN
  Administered 2018-09-25: 5.5 via INTRAVENOUS

## 2018-09-25 MED ORDER — SODIUM CHLORIDE 0.9% FLUSH
10.0000 mL | Freq: Once | INTRAVENOUS | Status: AC
  Administered 2018-09-25: 10 mL
  Filled 2018-09-25: qty 10

## 2018-09-26 ENCOUNTER — Inpatient Hospital Stay (HOSPITAL_BASED_OUTPATIENT_CLINIC_OR_DEPARTMENT_OTHER): Payer: Medicare Other | Admitting: Hematology and Oncology

## 2018-09-26 ENCOUNTER — Telehealth: Payer: Self-pay | Admitting: Hematology and Oncology

## 2018-09-26 ENCOUNTER — Encounter: Payer: Self-pay | Admitting: Hematology and Oncology

## 2018-09-26 DIAGNOSIS — K1231 Oral mucositis (ulcerative) due to antineoplastic therapy: Secondary | ICD-10-CM | POA: Diagnosis not present

## 2018-09-26 DIAGNOSIS — M84521A Pathological fracture in neoplastic disease, right humerus, initial encounter for fracture: Secondary | ICD-10-CM

## 2018-09-26 DIAGNOSIS — C8588 Other specified types of non-Hodgkin lymphoma, lymph nodes of multiple sites: Secondary | ICD-10-CM

## 2018-09-26 DIAGNOSIS — C8338 Diffuse large B-cell lymphoma, lymph nodes of multiple sites: Secondary | ICD-10-CM

## 2018-09-26 DIAGNOSIS — D61818 Other pancytopenia: Secondary | ICD-10-CM

## 2018-09-26 DIAGNOSIS — M79602 Pain in left arm: Secondary | ICD-10-CM | POA: Diagnosis not present

## 2018-09-26 DIAGNOSIS — D61810 Antineoplastic chemotherapy induced pancytopenia: Secondary | ICD-10-CM | POA: Diagnosis not present

## 2018-09-26 DIAGNOSIS — R634 Abnormal weight loss: Secondary | ICD-10-CM | POA: Diagnosis not present

## 2018-09-26 DIAGNOSIS — T451X5A Adverse effect of antineoplastic and immunosuppressive drugs, initial encounter: Secondary | ICD-10-CM | POA: Diagnosis not present

## 2018-09-26 NOTE — Assessment & Plan Note (Addendum)
She has history of pathological fracture PET CT scan show well-healed site She is reassured.

## 2018-09-26 NOTE — Assessment & Plan Note (Signed)
This is improving She will continue Magic mouthwash as needed

## 2018-09-26 NOTE — Assessment & Plan Note (Signed)
I have reviewed blood work and imaging study with the patient and her husband She has no signs of cancer recurrence The patient has multiple different side effects from chemotherapy We discussed the risk and benefits of continuing on maintenance treatment and she favor continuing on treatment I recommend reducing the dose of lenalidomide for the next 6 weeks with future plan to refill her prescription at 10 mg daily She will use up her current supply of medication first I plan to see her back in 6 weeks for further follow-up

## 2018-09-26 NOTE — Telephone Encounter (Signed)
Gave patient avs and calendar.   °

## 2018-09-26 NOTE — Progress Notes (Signed)
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[facility]) I have reviewed blood work and imaging study with the patient and her husband She has no signs of cancer recurrence The patient has multiple different side effects from chemotherapy We discussed the risk and benefits of continuing on maintenance treatment and she favor continuing on treatment I recommend reducing the dose of lenalidomide for the next 6 weeks with future plan to refill her prescription at 10 mg daily She will use up her current supply of medication first I plan to see her back in 6 weeks for further follow-up  Pancytopenia, acquired ([place]) Pancytopenia is improving She will resume her treatment at reduced dose  Mucositis due to antineoplastic therapy This is improving She will continue Magic mouthwash as needed  Pathological fracture in neoplastic disease, right humerus, initial encounter for fracture She has history of pathological fracture PET CT scan show well-healed site She is reassured.    No orders of the defined types were placed in this encounter.   INTERVAL HISTORY: Please see below for problem oriented charting. 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 She was treated with chlorambucil in 2000.   --2007 - 09/2008.  Treated with single  agent Rituxan.   --12/2008.  She had progressive adenopathy in chest and abdomen and repeat biopsy showed low grade follicular and diffuse B cell NHL.   --01/2009 - 07/2009. She required left ureteral stent and was treated with Treanda/Rituxan x 5 cycles from 02-2009 thru 06-2009.     09/16/2014 Imaging    No lymphadenopathy in the chest, abdomen, or pelvis. The small right axillary lymph nodes seen on the previous exam are unchanged in the 18 month interval. Small to moderate hiatal hernia.    11/20/2017 Imaging    Mildly displaced and angulated pathologic fracture of the proximal humeral shaft. 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     11/22/2017 PET scan    New hypermetabolic lymphadenopathy within the chest and abdomen, consistent with recurrent lymphoma. New large hypermetabolic mass in the right hepatic lobe, consistent with lymphoma this involvement. New small hypermetabolic mass in the left kidney, which may be due to lymphomatous involvement although primary renal cell carcinoma cannot be excluded. Hypermetabolic mass in the right humerus with pathologic fracture, consistent with lymphomatous involvement. Small hypermetabolic foci in right posterior chest wall, also suspicious for recurrent lymphoma.    11/23/2017 Procedure    Bone, fragment(s), Right Humeral Shaft Reamings - ATYPICAL LYMPHOID INFILTRATE CONSISTENT WITH NON-HODGKIN'S B CELL LYMPHOMA. - SEE ONCOLOGY TABLE. Microscopic Comment LYMPHOMA Histologic type: Non-Hodgkins lymphoma, favor large cell type. Grade (if applicable): Favor high grade. Flow cytometry: No specimen is available for analysis. Immunohistochemical stains: BCL-2, BCL-6, CD3, CD5, CD10, CD15, CD20, CD30, CD34, CD43, LCA, CD79a, CD138, Ki-67, PAX-5 and TdT with appropriate controls as performed on block 1C.  Touch preps/imprints: Not performed. Comments: The sections show multiple, primarily soft tissue fragments displaying  a dense lymphoid infiltrate characterized by a mixture of small round to slightly irregular lymphocytes, histiocytes, and large centroblastic appearing lymphoid cells with partially clumped to vesicular chromatin, small nucleoli and amphophilic to clear cytoplasm. The number of admixed large lymphoid cells is variable but they appear relatively abundant in some areas associated with clustering. No material is available for flow cytometric analysis. Hence, a large batter of immunohistochemical stains were performed and show a mixture of T and B cells but with a significant B cell component mostly composed of large lymphoid cells with positivity for CD79a, PAX-5, CD30, BCL-2, and BCL-6. LCA shows diffuse staining although some of the large lymphoid cells appear negative. No significant staining is seen with CD20, CD10, CD15, CD34, TdT, or CD138. The lack of CD20 expression in this material is possibly related to prior treatment. There is variable increased expression of Ki-67 ranging from 10 to >50% in some areas particularly where there is abundance of large lymphoid cells. There is admixed abundant T cell population in the background as seen with CD3, CD5, and CD43 with on apparent co-expression of CD5 in B cell areas. The overall findings are consistent with involvement by non-Hodgkin's B cell lymphoma and the presence of relative abundance of large lymphoid B-cells and increased Ki-67 expression favor a high grade large B cell lymphoma.    11/23/2017 Surgery    1. CPT 24516-Intramedullary nailing of right humerus fracture 2. CPT 20245-Bone biopsy, deep      11/23/2017 - 11/25/2017 Hospital Admission    She was admitted to the hospital for management of humerus fracture and aggressive lymphoma    11/24/2017 Imaging    ECHO showed LV EF: 65% -   70%    11/25/2017 Procedure    Status post ultrasound-guided biopsy of liver mass. Tissue specimen sent to pathology for complete histopathologic analysis     12/07/2017 Imaging    Successful placement of a right internal jugular approach power injectable Port-A-Cath. The catheter is ready for immediate use.    12/09/2017 - 03/29/2018 Chemotherapy    The patient had R-CHOP chemo      12/17/2017 - 12/20/2017 Hospital Admission    She was hospitalized for influenza infection    01/03/2018 Procedure    Unsuccessful initial L4-5 LP. Successful L2-3 lumbar puncture with installation of methotrexate.    01/24/2018 Procedure    Non complicated injection of intrathecal methotrexate, as above.    02/09/2018 Imaging    LV EF: 55% -  60%    02/10/2018 PET scan    1. Interval response to therapy. There has been resolution of previous hypermetabolic tumor within the chest and abdomen. Deauville criteria 2 and 3. 2. Diffuse radiotracer uptake is identified within the axial and appendicular skeleton which is favored to represent treatment related changes. 3. Significant decrease in FDG uptake associated with previously noted hypermetabolic tumor involving the proximal humeral diaphysis. The FDG uptake within this area on today's study is equivalent to that which is seen in the lumbar spine. 4. Aortic Atherosclerosis (ICD10-I70.0). Lad coronary artery calcifications. 5. Hiatal hernia    02/15/2018 Procedure    Fluoroscopic guided lumbar puncture and intrathecal injection of chemotherapy without complication.    03/31/2018 Procedure    Technically successful fluoroscopic guided lumbar puncture for intrathecal methotrexate injection. Procedure was well tolerated without evidence for immediate complications. Patient will recover in short stay prior to being discharged home  05/08/2018 PET scan    No residual/persistent enlarged or hypermetabolic lymph nodes in the neck, chest, abdomen or pelvis. Findings suggest an excellent response to treatment.    05/25/2018 -  Chemotherapy    The patient had Revlimid for maintenance chemo     06/07/2018 Imaging    LV EF: 55% -   60%    09/25/2018 PET scan    1. Stable exam. No hypermetabolic lymphadenopathy in the neck, chest, abdomen, or pelvis. 2.  Aortic Atherosclerois (ICD10-170.0) 3.  Emphysema. ([id number])      REVIEW OF SYSTEMS:   Constitutional: Denies fevers, chills or abnormal weight loss Eyes: Denies blurriness of vision Respiratory: Denies cough, dyspnea or wheezes Cardiovascular: Denies palpitation, chest discomfort or lower extremity swelling Gastrointestinal:  Denies nausea, heartburn or change in bowel habits Skin: Denies abnormal skin rashes Lymphatics: Denies new lymphadenopathy or easy bruising Neurological:Denies numbness, tingling or new weaknesses Behavioral/Psych: Mood is stable, no new changes  All other systems were reviewed with the patient and are negative.  I have reviewed the past medical history, past surgical history, social history and family history with the patient and they are unchanged from previous note.  ALLERGIES:  is allergic to contrast media [iodinated diagnostic agents]; iohexol; povidone-iodine; penicillins; and adhesive [tape].  MEDICATIONS:  Current Outpatient Medications  Medication Sig Dispense Refill  . acetaminophen (TYLENOL) 325 MG tablet Take 2 tablets (650 mg total) by mouth every 6 (six) hours as needed for mild pain (or Fever >/= 101). (Patient not taking: Reported on 04/19/2018)    . aspirin 81 MG tablet Take 81 mg by mouth daily.    Marland Kitchen BREO ELLIPTA 100-25 MCG/INH AEPB ONE INHALTION INTO LUNGS EVERY DAY 60 each 6  . cholecalciferol (VITAMIN D) 1000 units tablet Take 2,000 Units by mouth daily.    . citalopram (CELEXA) 40 MG tablet Take 1 tablet (40 mg total) by mouth daily. **DUE FOR PHYSICAL WITH CORY** 30 tablet 0  . lenalidomide (REVLIMID) 15 MG capsule Take 1 capsule (75m) by mouth once daily for 21 days on, 7 days off, repeat every 28 days 21 capsule 0  . magic mouthwash w/lidocaine SOLN Take 5 mLs by mouth 4 (four) times daily. 240 mL 0  .  pantoprazole (PROTONIX) 40 MG tablet Take 1 tablet (40 mg total) by mouth daily. 180 tablet 3   No current facility-administered medications for this visit.     PHYSICAL EXAMINATION: ECOG PERFORMANCE STATUS: 1 - Symptomatic but completely ambulatory  Vitals:   09/26/18 1019  BP: (!) 106/47  Pulse: (!) 53  Resp: 18  Temp: (!) 97.5 F (36.4 C)  SpO2: 95%   Filed Weights   09/26/18 1019  Weight: 116 lb (52.6 kg)    GENERAL:alert, no distress and comfortable SKIN: skin color, texture, turgor are normal, no rashes or significant lesions EYES: normal, Conjunctiva are pink and non-injected, sclera clear OROPHARYNX:no exudate, no erythema and lips, buccal mucosa, and tongue normal  NECK: supple, thyroid normal size, non-tender, without nodularity LYMPH:  no palpable lymphadenopathy in the cervical, axillary or inguinal LUNGS: clear to auscultation and percussion with normal breathing effort HEART: regular rate & rhythm and no murmurs and no lower extremity edema ABDOMEN:abdomen soft, non-tender and normal bowel sounds Musculoskeletal:no cyanosis of digits and no clubbing  NEURO: alert & oriented x 3 with fluent speech, no focal motor/sensory deficits  LABORATORY DATA:  I have reviewed the data as listed    Component Value Date/Time   NA 145  09/25/2018 1408   NA 142 12/28/2016 1002   K 3.9 09/25/2018 1408   K 4.6 12/28/2016 1002   CL 110 09/25/2018 1408   CL 101 03/28/2013 0826   CO2 26 09/25/2018 1408   CO2 29 12/28/2016 1002   GLUCOSE 78 09/25/2018 1408   GLUCOSE 85 12/28/2016 1002   GLUCOSE 84 03/28/2013 0826   BUN 11 09/25/2018 1408   BUN 20.3 12/28/2016 1002   CREATININE 0.71 09/25/2018 1408   CREATININE 0.9 12/28/2016 1002   CALCIUM 8.8 (L) 09/25/2018 1408   CALCIUM 10.1 12/28/2016 1002   PROT 5.5 (L) 09/25/2018 1408   PROT 6.8 12/28/2016 1002   ALBUMIN 3.6 09/25/2018 1408   ALBUMIN 4.2 12/28/2016 1002   AST 16 09/25/2018 1408   AST 20 12/28/2016 1002   ALT  13 09/25/2018 1408   ALT 12 12/28/2016 1002   ALKPHOS 112 09/25/2018 1408   ALKPHOS 111 12/28/2016 1002   BILITOT 0.6 09/25/2018 1408   BILITOT 0.55 12/28/2016 1002   GFRNONAA >60 09/25/2018 1408   GFRAA >60 09/25/2018 1408    No results found for: SPEP, UPEP  Lab Results  Component Value Date   WBC 3.6 (L) 09/25/2018   NEUTROABS 1.5 (L) 09/25/2018   HGB 11.1 (L) 09/25/2018   HCT 33.1 (L) 09/25/2018   MCV 102.2 (H) 09/25/2018   PLT 206 09/25/2018      Chemistry      Component Value Date/Time   NA 145 09/25/2018 1408   NA 142 12/28/2016 1002   K 3.9 09/25/2018 1408   K 4.6 12/28/2016 1002   CL 110 09/25/2018 1408   CL 101 03/28/2013 0826   CO2 26 09/25/2018 1408   CO2 29 12/28/2016 1002   BUN 11 09/25/2018 1408   BUN 20.3 12/28/2016 1002   CREATININE 0.71 09/25/2018 1408   CREATININE 0.9 12/28/2016 1002      Component Value Date/Time   CALCIUM 8.8 (L) 09/25/2018 1408   CALCIUM 10.1 12/28/2016 1002   ALKPHOS 112 09/25/2018 1408   ALKPHOS 111 12/28/2016 1002   AST 16 09/25/2018 1408   AST 20 12/28/2016 1002   ALT 13 09/25/2018 1408   ALT 12 12/28/2016 1002   BILITOT 0.6 09/25/2018 1408   BILITOT 0.55 12/28/2016 1002       RADIOGRAPHIC STUDIES: I have reviewed multiple imaging study with the patient and her husband I have personally reviewed the radiological images as listed and agreed with the findings in the report. Nm Pet Image Restag (ps) Skull Base To Thigh  Result Date: 09/26/2018 CLINICAL DATA:  Subsequent treatment strategy for lymphoma. EXAM: NUCLEAR MEDICINE PET SKULL BASE TO THIGH TECHNIQUE: 5.5 mCi F-18 FDG was injected intravenously. Full-ring PET imaging was performed from the skull base to thigh after the radiotracer. CT data was obtained and used for attenuation correction and anatomic localization. Fasting blood glucose: 71 mg/dl COMPARISON:  05/08/2018. FINDINGS: Mediastinal blood pool activity: SUV max 1.8 NECK: No hypermetabolic lymph nodes in  the neck. Incidental CT findings: 9. CHEST: No hypermetabolic mediastinal or hilar nodes. No suspicious pulmonary nodules on the CT scan. Diffuse FDG uptake in the esophagus is likely physiologic. Incidental CT findings: Heart size upper normal. Coronary artery calcification is evident. Atherosclerotic calcification is noted in the wall of the thoracic aorta. Right Port-A-Cath tip is at the SVC/RA junction. Emphysema noted bilaterally. Minimal dependent atelectasis noted left lower lobe. ABDOMEN/PELVIS: No abnormal hypermetabolic activity within the liver, pancreas, adrenal glands, or spleen. No hypermetabolic  lymph nodes in the abdomen or pelvis. Incidental CT findings: There is abdominal aortic atherosclerosis without aneurysm. SKELETON: No focal hypermetabolic activity to suggest skeletal metastasis. Uptake again noted in the region of the left glenohumeral joint, likely degenerative. Incidental CT findings: Intramedullary nail again noted right humerus. Sclerosis without hypermetabolism in the right ischial tuberosity and right femoral neck is stable. IMPRESSION: 1. Stable exam. No hypermetabolic lymphadenopathy in the neck, chest, abdomen, or pelvis. 2.  Aortic Atherosclerois (ICD10-170.0) 3.  Emphysema. ([id number]) Electronically Signed   By: Misty Stanley M.D.   On: 09/26/2018 08:00    All questions were answered. The patient knows to call the clinic with any problems, questions or concerns. No barriers to learning was detected.  I spent 15 minutes counseling the patient face to face. The total time spent in the appointment was 20 minutes and more than 50% was on counseling and review of test results  [name], MD 09/26/2018 10:55 AM

## 2018-09-26 NOTE — Assessment & Plan Note (Signed)
Pancytopenia is improving She will resume her treatment at reduced dose

## 2018-10-02 DIAGNOSIS — H52203 Unspecified astigmatism, bilateral: Secondary | ICD-10-CM | POA: Diagnosis not present

## 2018-10-02 DIAGNOSIS — Z961 Presence of intraocular lens: Secondary | ICD-10-CM | POA: Diagnosis not present

## 2018-10-06 ENCOUNTER — Telehealth: Payer: Self-pay

## 2018-10-06 NOTE — Telephone Encounter (Addendum)
Oral Oncology Patient Advocate Encounter  I received an email from [facility] that they would like for the patient to call for their refill before 10/10/18 so that they will have plenty of medicine to get her through the re-enrollment period. I called Ms. Cripps and she said that per Dr. Alvy Bimler instructions she is taking 1 capsule every other day and has #14 on hand. This will give her a month left. She was instructed by Dr. Alvy Bimler to call when she gets low and she would send a new prescription for a lower dose to [facility] at that time. I told her I would pass this information along and let [facility] know that she has enough on hand right now.  Ms. Lavis verbalized understanding and appreciation  [place] [facility] [place] Phone [phone] Fax [phone]

## 2018-10-17 ENCOUNTER — Other Ambulatory Visit: Payer: Self-pay | Admitting: Hematology and Oncology

## 2018-10-17 DIAGNOSIS — C8338 Diffuse large B-cell lymphoma, lymph nodes of multiple sites: Secondary | ICD-10-CM

## 2018-10-17 MED ORDER — LENALIDOMIDE 10 MG PO CAPS: ORAL_CAPSULE | ORAL | 11 refills | Status: DC

## 2018-10-18 ENCOUNTER — Telehealth: Payer: Self-pay

## 2018-10-18 NOTE — Telephone Encounter (Signed)
Called to see how many Revlimid pills she has left. She is currently taking it every other day. She has enough to last until 1/24 and will be off for 7 days. Will send refill 1/24

## 2018-10-23 ENCOUNTER — Encounter: Payer: Self-pay | Admitting: Family Medicine

## 2018-10-23 ENCOUNTER — Ambulatory Visit (INDEPENDENT_AMBULATORY_CARE_PROVIDER_SITE_OTHER): Payer: Medicare Other

## 2018-10-23 ENCOUNTER — Ambulatory Visit (INDEPENDENT_AMBULATORY_CARE_PROVIDER_SITE_OTHER): Payer: Medicare Other | Admitting: Family Medicine

## 2018-10-23 VITALS — BP 102/60 | HR 61 | Temp 97.7°F | Resp 16

## 2018-10-23 DIAGNOSIS — R059 Cough, unspecified: Secondary | ICD-10-CM

## 2018-10-23 DIAGNOSIS — J449 Chronic obstructive pulmonary disease, unspecified: Secondary | ICD-10-CM

## 2018-10-23 DIAGNOSIS — R05 Cough: Secondary | ICD-10-CM

## 2018-10-23 DIAGNOSIS — J00 Acute nasopharyngitis [common cold]: Secondary | ICD-10-CM | POA: Diagnosis not present

## 2018-10-23 MED ORDER — BENZONATATE 100 MG PO CAPS: 200.0000 mg | ORAL_CAPSULE | Freq: Two times a day (BID) | ORAL | 0 refills | Status: AC | PRN

## 2018-10-23 MED ORDER — ALBUTEROL SULFATE HFA 108 (90 BASE) MCG/ACT IN AERS: 2.0000 | INHALATION_SPRAY | Freq: Four times a day (QID) | RESPIRATORY_TRACT | 0 refills | Status: DC | PRN

## 2018-10-23 NOTE — Progress Notes (Signed)
ACUTE VISIT  HPI:  Chief Complaint  Patient presents with  . Cough    non-productive x10 days  . Nasal Congestion    xapproximately 3 weeks    [name] is a 78 y.o.female with Hx of B cells lymphoma,COPD, and HTN is here today with her husband complaining of 15 days of respiratory symptoms. Productive cough with clear sputum. She has not noted SOB or wheezing.  Nasal congestion,rhinorrhea,and sneezing.  Her husband is concerned about possible pneumonia.  +Itchy throat, denies dysphagia,dyspnea,or stridor. + Sore throat, which she attributes to Revlimid.  She is not sure about exacerbating or alleviating factors.  No fever,chills,or body aches.  Cough  This is a new problem. The current episode started 1 to 4 weeks ago. The problem has been waxing and waning. The problem occurs every few hours. The cough is productive of sputum. Associated symptoms include nasal congestion, postnasal drip, rhinorrhea and a sore throat. Pertinent negatives include no chest pain, ear congestion, ear pain, eye redness, fever, headaches, heartburn, hemoptysis, myalgias, rash, shortness of breath or wheezing. She has tried a beta-agonist inhaler for the symptoms. Her past medical history is significant for COPD and environmental allergies.   No Hx of recent travel. Husband was sick around same time,already recovered. No known insect bite.  Hx of allergies: Neg. COPD on  Breo Ellipta 100-25 mcg daily.  OTC medications for this problem: None   Review of Systems  Constitutional: Positive for fatigue. Negative for activity change, appetite change and fever.  HENT: Positive for congestion, postnasal drip, rhinorrhea, sneezing and sore throat. Negative for ear pain, mouth sores, sinus pressure and voice change.   Eyes: Negative for discharge, redness and itching.  Respiratory: Positive for cough. Negative for hemoptysis, shortness of breath and wheezing.   Cardiovascular:  Negative for chest pain.  Gastrointestinal: Negative for abdominal pain, diarrhea, heartburn, nausea and vomiting.  Musculoskeletal: Positive for gait problem. Negative for myalgias.  Skin: Negative for rash.  Allergic/Immunologic: Positive for environmental allergies.  Neurological: Negative for syncope, weakness and headaches.  Psychiatric/Behavioral: Negative for confusion. The patient is nervous/anxious.       Current Outpatient Medications on File Prior to Visit  Medication Sig Dispense Refill  . acetaminophen (TYLENOL) 325 MG tablet Take 2 tablets (650 mg total) by mouth every 6 (six) hours as needed for mild pain (or Fever >/= 101).    Marland Kitchen aspirin 81 MG tablet Take 81 mg by mouth daily.    Marland Kitchen BREO ELLIPTA 100-25 MCG/INH AEPB ONE INHALTION INTO LUNGS EVERY DAY 60 each 6  . cholecalciferol (VITAMIN D) 1000 units tablet Take 2,000 Units by mouth daily.    . citalopram (CELEXA) 40 MG tablet Take 1 tablet (40 mg total) by mouth daily. **DUE FOR PHYSICAL WITH CORY** 30 tablet 0  . lenalidomide (REVLIMID) 10 MG capsule Take 1 capsule (15mg ) by mouth once daily for 21 days on, 7 days off, repeat every 28 days 21 capsule 11  . magic mouthwash w/lidocaine SOLN Take 5 mLs by mouth 4 (four) times daily. 240 mL 0  . pantoprazole (PROTONIX) 40 MG tablet Take 1 tablet (40 mg total) by mouth daily. 180 tablet 3   No current facility-administered medications on file prior to visit.      Past Medical History:  Diagnosis Date  . Cancer ([place])  APRIL OF 2000   LOW-GRADE NON-HODGKIN'S LYMPHOMA  . COPD (chronic obstructive pulmonary disease) ([place])   . Emphysema of  lung ([place])   . Hyperlipidemia    Allergies  Allergen Reactions  . Contrast Media [Iodinated Diagnostic Agents] Shortness Of Breath, Rash and Other (See Comments)    Throat swells  . Iohexol Shortness Of Breath  . Povidone-Iodine Hives, Shortness Of Breath and Rash    REACTION: throat swells  . Penicillins Hives, Itching and Rash     Has patient had a PCN reaction causing immediate rash, facial/tongue/throat swelling, SOB or lightheadedness with hypotension: Unknown Has patient had a PCN reaction causing severe rash involving mucus membranes or skin necrosis: Yes Has patient had a PCN reaction that required hospitalization: Unknown Has patient had a PCN reaction occurring within the last 10 years: Unknown If all of the above answers are "NO", then may proceed with Cephalosporin use.   Dorma Russell [Tape] Hives    Social History   Socioeconomic History  . Marital status: Married    Spouse name: Not on file  . Number of children: Not on file  . Years of education: Not on file  . Highest education level: Not on file  Occupational History  . Not on file  Social Needs  . Financial resource strain: Not on file  . Food insecurity:    Worry: Not on file    Inability: Not on file  . Transportation needs:    Medical: Not on file    Non-medical: Not on file  Tobacco Use  . Smoking status: Former Smoker    Packs/day: 0.25    Years: 51.00    Pack years: 12.75    Types: Cigarettes    Last attempt to quit: 03/12/2015    Years since quitting: 3.6  . Smokeless tobacco: Never Used  Substance and Sexual Activity  . Alcohol use: Yes    Alcohol/week: 0.0 standard drinks    Comment: 1 glass of wine monthly  . Drug use: No  . Sexual activity: Not Currently  Lifestyle  . Physical activity:    Days per week: Not on file    Minutes per session: Not on file  . Stress: Not on file  Relationships  . Social connections:    Talks on phone: Not on file    Gets together: Not on file    Attends religious service: Not on file    Active member of club or organization: Not on file    Attends meetings of clubs or organizations: Not on file    Relationship status: Not on file  Other Topics Concern  . 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       Pets: Dog      Diet: Healthy diet, fruits, veggies and fish.    Exercise: None        Vitals:   10/23/18 1618  BP: 102/60  Pulse: 61  Resp: 16  Temp: 97.7 F (36.5 C)  SpO2: 96%   There is no height or weight on file to calculate BMI.    Physical Exam  Nursing note and vitals reviewed. Constitutional: She is oriented to person, place, and time. She appears well-developed and well-nourished. She does not appear ill. No distress.  HENT:  Head: Normocephalic and atraumatic.  Left  Ear: Tympanic membrane and ear canal normal.  Nose: Rhinorrhea present. Right sinus exhibits no maxillary sinus tenderness and no frontal sinus tenderness. Left sinus exhibits no maxillary sinus tenderness and no frontal sinus tenderness.  Mouth/Throat: Oropharynx is clear and moist and mucous membranes are normal.  Hypertrophic turbinates.  Eyes: Conjunctivae are normal.  Neck: No muscular tenderness present. No edema and no erythema present.  Cardiovascular: Normal rate and regular rhythm.  No murmur heard. Respiratory: Effort normal and breath sounds normal. No stridor. No respiratory distress.  Lymphadenopathy:    She has no cervical adenopathy.  Neurological: She is alert and oriented to person, place, and time. She has normal strength.  Skin: Skin is warm. No rash noted. No erythema.  Psychiatric: She has a normal mood and affect.  Well groomed, good eye contact.      ASSESSMENT AND PLAN:  Ms. Faelyn was seen today for cough and nasal congestion.  Diagnoses and all orders for this visit:  Cough Lung auscultation negative today except for mild hypoventilation. Possible etiologies discussed. Options discussed including empiric abx treatment.She is afraid of side effects, already having diarrhea.  CXR ordered today,further recommendations will be given  accordingly.  -     DG Chest 2 View; Future -     benzonatate (TESSALON) 100 MG capsule; Take 2 capsules (200 mg total) by mouth 2 (two) times daily as needed for up to 10 days.  Rhinopharyngitis ? Allergic. Flonase nasal spray daily and nasal irrigations a few times during the day. Also Allegra 180 mg daily. Instructed about warning signs.  COPD GOLD II Negative for wheezing or rhonchi today. No changes in [facility]. Albuterol inh 2 puff every 6 hours for a week then as needed for wheezing or shortness of breath.   -     albuterol (PROVENTIL HFA;VENTOLIN HFA) 108 (90 Base) MCG/ACT inhaler; Inhale 2 puffs into the lungs every 6 (six) hours as needed for wheezing or shortness of breath.      G. Martinique, MD  [facility]. [place] office.

## 2018-10-23 NOTE — Patient Instructions (Addendum)
  [name] I have seen you today for an acute visit.  A few things to remember from today's visit:   Cough - Plan: DG Chest 2 View, benzonatate (TESSALON) 100 MG capsule  COPD GOLD II  Rhinopharyngitis   Flonase nasal spray daily. Nasal irrigation with saline a few times per day and as needed. Over-the-counter Allegra 180 mg daily.  Monitor for fever or low temperature. Albuterol inh 2 puff every 6 hours for a week then as needed for wheezing or shortness of breath.    In general please monitor for signs of worsening symptoms and seek immediate medical attention if any concerning.  If symptoms are not resolved in 1-2 weeks you should schedule a follow up appointment with your doctor, before if needed.  I hope you get better soon!

## 2018-10-24 ENCOUNTER — Other Ambulatory Visit: Payer: Self-pay | Admitting: Family Medicine

## 2018-10-24 ENCOUNTER — Encounter: Payer: Self-pay | Admitting: Family Medicine

## 2018-10-24 DIAGNOSIS — F32A Depression, unspecified: Secondary | ICD-10-CM

## 2018-10-24 DIAGNOSIS — Z760 Encounter for issue of repeat prescription: Secondary | ICD-10-CM

## 2018-10-24 DIAGNOSIS — F329 Major depressive disorder, single episode, unspecified: Secondary | ICD-10-CM

## 2018-10-24 MED ORDER — CITALOPRAM HYDROBROMIDE 40 MG PO TABS: 40.0000 mg | ORAL_TABLET | Freq: Every day | ORAL | 0 refills | Status: DC

## 2018-10-24 NOTE — Telephone Encounter (Signed)
Sent to the pharmacy by e-scribe and letter mailed.

## 2018-10-24 NOTE — Telephone Encounter (Signed)
Ok to refill for 30 days but it is time for an office visit

## 2018-10-25 ENCOUNTER — Ambulatory Visit: Payer: Medicare Other | Admitting: Adult Health

## 2018-10-26 ENCOUNTER — Telehealth: Payer: Self-pay

## 2018-10-26 NOTE — Telephone Encounter (Signed)
Oral Oncology Patient Advocate Encounter  [employer] requires [place] to apply for a grant when funding is available during the re-enrollment process for Revlimid. I have applied for the patient to the Leukemia and Lymphoma society grant and they have everything they need from me. I was instructed to call back Monday to follow up on status.  This encounter will continue to be updated until final determination.  [place] Patient [place] Phone [phone] Fax [phone]

## 2018-10-30 ENCOUNTER — Telehealth: Payer: Self-pay

## 2018-10-30 ENCOUNTER — Encounter: Payer: Self-pay | Admitting: Hematology and Oncology

## 2018-10-30 ENCOUNTER — Telehealth: Payer: Self-pay | Admitting: Pharmacist

## 2018-10-30 ENCOUNTER — Other Ambulatory Visit: Payer: Self-pay

## 2018-10-30 DIAGNOSIS — C8338 Diffuse large B-cell lymphoma, lymph nodes of multiple sites: Secondary | ICD-10-CM

## 2018-10-30 MED ORDER — LENALIDOMIDE 10 MG PO CAPS: ORAL_CAPSULE | ORAL | 11 refills | Status: DC

## 2018-10-30 NOTE — Telephone Encounter (Signed)
Oral Oncology Pharmacist Encounter  Received notification from oral oncology patient advocate that she was successful in obtaining a copayment grant to cover the out-of-pocket costs of patient's Revlimid.  This grant is from the leukemia and [place]. Fatima Sanger information has been shared with the dispensing pharmacy  Patient had been receiving her Revlimid at no out-of-pocket cost to the patient through [employer] assistance program, with Revlimid dispensed from [facility] by [employer].  Patient will now be receiving her Revlimid from [facility].  Patient does need a new prescription for Revlimid to be sent to [facility].  I have left voicemail for collaborative practice RN to obtain a new [employer] authorization number and E scribe new prescription to correct dispensing pharmacy.  Johny Drilling, PharmD, BCPS, BCOP  10/30/2018 10:07 AM Oral Oncology Clinic [phone]

## 2018-10-30 NOTE — Telephone Encounter (Signed)
She called and left a message. She is trying to get a hold of Krista in oral chemo.  Called and left a message for Daleen Snook to call her.

## 2018-10-30 NOTE — Telephone Encounter (Signed)
Oral Oncology Patient Advocate Encounter  I was successful at securing a grant with [employer]uk[employer] Lymphoma [employer] for $5000. This will keep the out of pocket expense for Revlimid at $0. The grant information is as follows and has been shared with [place].  Approval dates: 10/11/18-10/11/19 ID: [id number] Group: [id number] BIN: [id number] PCN: [id number]  I called the patient to give her this information and had to leave a voicemail.  [place] Patient [place] Phone [phone] Fax [phone]

## 2018-10-30 NOTE — Telephone Encounter (Signed)
Oral Oncology Patient Advocate Encounter  LLS is closed today, I will call tomorrow to follow up on grant status.  [place] Patient [place] Phone [phone] Fax [phone]

## 2018-10-31 NOTE — Telephone Encounter (Signed)
Oral Oncology Patient Advocate Encounter  [employer] requires [place] to apply for a grant if there is grant funding available during the re-enrollment period. This grant information is in a separate encounter.  [place] Patient [place] Phone [phone] Fax [phone]

## 2018-11-07 ENCOUNTER — Inpatient Hospital Stay (HOSPITAL_BASED_OUTPATIENT_CLINIC_OR_DEPARTMENT_OTHER): Payer: Medicare Other | Admitting: Hematology and Oncology

## 2018-11-07 ENCOUNTER — Inpatient Hospital Stay: Payer: Medicare Other | Attending: Hematology and Oncology

## 2018-11-07 ENCOUNTER — Inpatient Hospital Stay: Payer: Medicare Other

## 2018-11-07 ENCOUNTER — Other Ambulatory Visit: Payer: Self-pay

## 2018-11-07 ENCOUNTER — Encounter: Payer: Self-pay | Admitting: Hematology and Oncology

## 2018-11-07 DIAGNOSIS — C8338 Diffuse large B-cell lymphoma, lymph nodes of multiple sites: Secondary | ICD-10-CM | POA: Insufficient documentation

## 2018-11-07 DIAGNOSIS — C8588 Other specified types of non-Hodgkin lymphoma, lymph nodes of multiple sites: Secondary | ICD-10-CM

## 2018-11-07 DIAGNOSIS — K1231 Oral mucositis (ulcerative) due to antineoplastic therapy: Secondary | ICD-10-CM | POA: Insufficient documentation

## 2018-11-07 DIAGNOSIS — Z452 Encounter for adjustment and management of vascular access device: Secondary | ICD-10-CM | POA: Diagnosis not present

## 2018-11-07 DIAGNOSIS — Z95828 Presence of other vascular implants and grafts: Secondary | ICD-10-CM

## 2018-11-07 DIAGNOSIS — D61818 Other pancytopenia: Secondary | ICD-10-CM | POA: Diagnosis not present

## 2018-11-07 LAB — CMP (CANCER CENTER ONLY)
ALT: 14 U/L (ref 0–44)
AST: 17 U/L (ref 15–41)
Albumin: 3.7 g/dL (ref 3.5–5.0)
Alkaline Phosphatase: 117 U/L (ref 38–126)
Anion gap: 10 (ref 5–15)
BUN: 13 mg/dL (ref 8–23)
CO2: 27 mmol/L (ref 22–32)
CREATININE: 0.82 mg/dL (ref 0.44–1.00)
Calcium: 9.1 mg/dL (ref 8.9–10.3)
Chloride: 105 mmol/L (ref 98–111)
GFR, Est AFR Am: >60 mL/min (ref >60)
GFR, Estimated: >60 mL/min (ref >60)
Glucose, Bld: 92 mg/dL (ref 70–99)
Potassium: 3.9 mmol/L (ref 3.5–5.1)
Sodium: 142 mmol/L (ref 135–145)
Total Bilirubin: 0.5 mg/dL (ref 0.3–1.2)
Total Protein: 5.8 g/dL — ABNORMAL LOW (ref 6.5–8.1)

## 2018-11-07 LAB — CBC WITH DIFFERENTIAL (CANCER CENTER ONLY)
Abs Immature Granulocytes: 0 10*3/uL (ref 0.00–0.07)
Basophils Absolute: 0.1 10*3/uL (ref 0.0–0.1)
Basophils Relative: 2 %
Eosinophils Absolute: 0.6 10*3/uL — ABNORMAL HIGH (ref 0.0–0.5)
Eosinophils Relative: 16 %
HEMATOCRIT: 36.6 % (ref 36.0–46.0)
Hemoglobin: 12.1 g/dL (ref 12.0–15.0)
Immature Granulocytes: 0 %
LYMPHS ABS: 0.9 10*3/uL (ref 0.7–4.0)
Lymphocytes Relative: 25 %
MCH: 34.5 pg — ABNORMAL HIGH (ref 26.0–34.0)
MCHC: 33.1 g/dL (ref 30.0–36.0)
MCV: 104.3 fL — ABNORMAL HIGH (ref 80.0–100.0)
MONOS PCT: 13 %
Monocytes Absolute: 0.5 10*3/uL (ref 0.1–1.0)
Neutro Abs: 1.6 10*3/uL — ABNORMAL LOW (ref 1.7–7.7)
Neutrophils Relative %: 44 %
Platelet Count: 202 10*3/uL (ref 150–400)
RBC: 3.51 MIL/uL — ABNORMAL LOW (ref 3.87–5.11)
RDW: 14.4 % (ref 11.5–15.5)
WBC Count: 3.6 10*3/uL — ABNORMAL LOW (ref 4.0–10.5)
nRBC: 0 % (ref 0.0–0.2)

## 2018-11-07 LAB — URIC ACID: Uric Acid, Serum: 5.7 mg/dL (ref 2.5–7.1)

## 2018-11-07 MED ORDER — HEPARIN SOD (PORK) LOCK FLUSH 100 UNIT/ML IV SOLN
500.0000 [IU] | Freq: Once | INTRAVENOUS | Status: AC
  Administered 2018-11-07: 500 [IU] via INTRAVENOUS
  Filled 2018-11-07: qty 5

## 2018-11-07 MED ORDER — SODIUM CHLORIDE 0.9% FLUSH
10.0000 mL | Freq: Once | INTRAVENOUS | Status: AC
  Administered 2018-11-07: 10 mL via INTRAVENOUS
  Filled 2018-11-07: qty 10

## 2018-11-07 NOTE — Assessment & Plan Note (Signed)
I do not appreciate any oral lesions It could be related to side effects of Revlimid I recommend complete recovery of mucositis before resumption of Revlimid at lower dose.  We will call her next week

## 2018-11-07 NOTE — Telephone Encounter (Signed)
Oral Oncology Patient Advocate Encounter  Carley Hammed has been having a hard time getting in touch with the patient to schedule shipment of her Revlimid. I called the patient on 11/02/18 and left [facility] number on their voicemail. I followed up with the patient again today and she stated that she called the number and they keep hanging up on her. I emailed our direct contact with [facility] and made them aware of this and gave them her number to call. [facility] stated they would call her today.   [place] Patient [place] Phone [phone] Fax [phone]

## 2018-11-07 NOTE — Assessment & Plan Note (Signed)
She is currently on her week break before resumption of chemotherapy, due around February 3 She is still have persistent mucositis We will call her next week to make sure her mucositis has resolved before resumption of Revlimid If her mucositis does not resolve, she will need to make a dentist appointment for further evaluation

## 2018-11-07 NOTE — Assessment & Plan Note (Signed)
Pancytopenia is improving She will resume her treatment at reduced dose next week

## 2018-11-07 NOTE — Progress Notes (Signed)
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  We will call her next week  Pancytopenia, acquired ([place]) Pancytopenia is improving She will resume her treatment at reduced dose next week   No orders of the defined types were placed in this encounter.   INTERVAL HISTORY: Please see below for problem oriented charting. She returns with her husband for further follow-up She had recent mucositis and mild loose stool She has lost a bit of weight because of that The patient denies any recent signs or symptoms of bleeding such as spontaneous epistaxis, hematuria or hematochezia. She denies recent fever or chills.  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 She was treated with chlorambucil in 2000.   --2007 - 09/2008.  Treated with single agent Rituxan.   --12/2008.  She had progressive adenopathy in chest and abdomen and repeat biopsy showed low grade follicular and  diffuse B cell NHL.   --01/2009 - 07/2009. She required left ureteral stent and was treated with Treanda/Rituxan x 5 cycles from 02-2009 thru 06-2009.     09/16/2014 Imaging    No lymphadenopathy in the chest, abdomen, or pelvis. The small right axillary lymph nodes seen on the previous exam are unchanged in the 18 month interval. Small to moderate hiatal hernia.    11/20/2017 Imaging    Mildly displaced and angulated pathologic fracture of the proximal humeral shaft. 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     11/22/2017 PET scan    New hypermetabolic lymphadenopathy within the chest and abdomen, consistent with recurrent lymphoma. New large hypermetabolic mass in the right hepatic lobe, consistent with lymphoma this involvement. New small hypermetabolic mass in the left kidney, which may be due to lymphomatous involvement although primary renal cell carcinoma cannot be excluded. Hypermetabolic mass in the right humerus with pathologic fracture, consistent with lymphomatous involvement. Small hypermetabolic foci in right posterior chest wall, also suspicious for recurrent lymphoma.    11/23/2017 Procedure    Bone, fragment(s), Right Humeral Shaft Reamings - ATYPICAL LYMPHOID INFILTRATE CONSISTENT WITH NON-HODGKIN'S B CELL LYMPHOMA. - SEE ONCOLOGY TABLE. Microscopic Comment LYMPHOMA Histologic type: Non-Hodgkins lymphoma, favor large cell type. Grade (if applicable): Favor high grade. Flow cytometry: No specimen is available for analysis. Immunohistochemical stains: BCL-2, BCL-6, CD3, CD5, CD10, CD15, CD20, CD30, CD34, CD43, LCA, CD79a, CD138, Ki-67, PAX-5 and TdT with appropriate controls as performed on block 1C. Touch preps/imprints: Not performed. Comments: The sections show multiple, primarily soft tissue fragments displaying  a dense lymphoid infiltrate characterized by a mixture of small round to slightly irregular lymphocytes, histiocytes, and large  centroblastic appearing lymphoid cells with partially clumped to vesicular chromatin, small nucleoli and amphophilic to clear cytoplasm. The number of admixed large lymphoid cells is variable but they appear relatively abundant in some areas associated with clustering. No material is available for flow cytometric analysis. Hence, a large batter of immunohistochemical stains were performed and show a mixture of T and B cells but with a significant B cell component mostly composed of large lymphoid cells with positivity for CD79a, PAX-5, CD30, BCL-2, and BCL-6. LCA shows diffuse staining although some of the large lymphoid cells appear negative. No significant staining is seen with CD20, CD10, CD15, CD34, TdT, or CD138. The lack of CD20 expression in this material is possibly related to prior treatment. There is variable increased expression of Ki-67 ranging from 10 to >50% in some areas particularly where there is abundance of large lymphoid cells. There is admixed abundant T cell population in the background as seen with CD3, CD5, and CD43 with on apparent co-expression of CD5 in B cell areas. The overall findings are consistent with involvement by non-Hodgkin's B cell lymphoma and the presence of relative abundance of large lymphoid B-cells and increased Ki-67 expression favor a high grade large B cell lymphoma.    11/23/2017 Surgery    1. CPT 24516-Intramedullary nailing of right humerus fracture 2. CPT 20245-Bone biopsy, deep      11/23/2017 - 11/25/2017 Hospital Admission    She was admitted to the hospital for management of humerus fracture and aggressive lymphoma    11/24/2017 Imaging    ECHO showed LV EF: 65% -   70%    11/25/2017 Procedure    Status post ultrasound-guided biopsy of liver mass. Tissue specimen sent to pathology for complete histopathologic analysis    12/07/2017 Imaging    Successful placement of a right internal jugular approach power injectable Port-A-Cath. The catheter is  ready for immediate use.    12/09/2017 - 03/29/2018 Chemotherapy    The patient had R-CHOP chemo      12/17/2017 - 12/20/2017 Hospital Admission    She was hospitalized for influenza infection    01/03/2018 Procedure    Unsuccessful initial L4-5 LP. Successful L2-3 lumbar puncture with installation of methotrexate.    01/24/2018 Procedure    Non complicated injection of intrathecal methotrexate, as above.    02/09/2018 Imaging    LV EF: 55% -  60%    02/10/2018 PET scan    1. Interval response to therapy. There has been resolution of previous hypermetabolic tumor within the chest and abdomen. Deauville criteria 2 and 3. 2. Diffuse radiotracer uptake is identified within the axial and appendicular skeleton which is favored to represent treatment related changes. 3. Significant decrease in FDG uptake associated with previously noted hypermetabolic tumor involving the proximal humeral diaphysis. The FDG uptake within this area on today's study is equivalent to that which is seen in the lumbar spine. 4. Aortic Atherosclerosis (ICD10-I70.0). Lad coronary artery calcifications. 5. Hiatal hernia    02/15/2018 Procedure    Fluoroscopic guided lumbar puncture and intrathecal injection of chemotherapy without complication.    03/31/2018 Procedure    Technically successful fluoroscopic guided lumbar puncture for intrathecal methotrexate injection. Procedure was well tolerated without evidence for immediate complications. Patient will recover in short stay prior to being discharged home    05/08/2018 PET scan    No residual/persistent enlarged or hypermetabolic  lymph nodes in the neck, chest, abdomen or pelvis. Findings suggest an excellent response to treatment.    05/25/2018 -  Chemotherapy    The patient had Revlimid for maintenance chemo     06/07/2018 Imaging    LV EF: 55% -  60%    09/25/2018 PET scan    1. Stable exam. No hypermetabolic lymphadenopathy in the neck, chest, abdomen, or pelvis. 2.   Aortic Atherosclerois (ICD10-170.0) 3.  Emphysema. ([id number])      REVIEW OF SYSTEMS:   Constitutional: Denies fevers, chills or abnormal weight loss Eyes: Denies blurriness of vision Respiratory: Denies cough, dyspnea or wheezes Cardiovascular: Denies palpitation, chest discomfort or lower extremity swelling Skin: Denies abnormal skin rashes Lymphatics: Denies new lymphadenopathy or easy bruising Neurological:Denies numbness, tingling or new weaknesses Behavioral/Psych: Mood is stable, no new changes  All other systems were reviewed with the patient and are negative.  I have reviewed the past medical history, past surgical history, social history and family history with the patient and they are unchanged from previous note.  ALLERGIES:  is allergic to contrast media [iodinated diagnostic agents]; iohexol; povidone-iodine; penicillins; and adhesive [tape].  MEDICATIONS:  Current Outpatient Medications  Medication Sig Dispense Refill  . acetaminophen (TYLENOL) 325 MG tablet Take 2 tablets (650 mg total) by mouth every 6 (six) hours as needed for mild pain (or Fever >/= 101).    Marland Kitchen albuterol (PROVENTIL HFA;VENTOLIN HFA) 108 (90 Base) MCG/ACT inhaler Inhale 2 puffs into the lungs every 6 (six) hours as needed for wheezing or shortness of breath. 1 Inhaler 0  . aspirin 81 MG tablet Take 81 mg by mouth daily.    Marland Kitchen BREO ELLIPTA 100-25 MCG/INH AEPB ONE INHALTION INTO LUNGS EVERY DAY 60 each 6  . cholecalciferol (VITAMIN D) 1000 units tablet Take 2,000 Units by mouth daily.    . citalopram (CELEXA) 40 MG tablet Take 1 tablet (40 mg total) by mouth daily. **DUE FOR PHYSICAL WITH CORY** 30 tablet 0  . lenalidomide (REVLIMID) 10 MG capsule Take 1 capsule (61m) by mouth once daily for 21 days on, 7 days off, repeat every 28 days 21 capsule 11  . magic mouthwash w/lidocaine SOLN Take 5 mLs by mouth 4 (four) times daily. 240 mL 0  . pantoprazole (PROTONIX) 40 MG tablet Take 1 tablet (40 mg  total) by mouth daily. 180 tablet 3   No current facility-administered medications for this visit.     PHYSICAL EXAMINATION: ECOG PERFORMANCE STATUS: 1 - Symptomatic but completely ambulatory  Vitals:   11/07/18 1114  BP: (!) 117/59  Pulse: 64  Resp: 18  Temp: 98.1 F (36.7 C)  SpO2: 97%   Filed Weights   11/07/18 1114  Weight: 113 lb 9.6 oz (51.5 kg)    GENERAL:alert, no distress and comfortable SKIN: skin color, texture, turgor are normal, no rashes or significant lesions EYES: normal, Conjunctiva are pink and non-injected, sclera clear OROPHARYNX:no exudate, no erythema and lips, buccal mucosa, and tongue normal  NECK: supple, thyroid normal size, non-tender, without nodularity LYMPH:  no palpable lymphadenopathy in the cervical, axillary or inguinal LUNGS: clear to auscultation and percussion with normal breathing effort HEART: regular rate & rhythm and no murmurs and no lower extremity edema ABDOMEN:abdomen soft, non-tender and normal bowel sounds Musculoskeletal:no cyanosis of digits and no clubbing  NEURO: alert & oriented x 3 with fluent speech, no focal motor/sensory deficits  LABORATORY DATA:  I have reviewed the data as listed    Component Value  Date/Time   NA 142 11/07/2018 1019   NA 142 12/28/2016 1002   K 3.9 11/07/2018 1019   K 4.6 12/28/2016 1002   CL 105 11/07/2018 1019   CL 101 03/28/2013 0826   CO2 27 11/07/2018 1019   CO2 29 12/28/2016 1002   GLUCOSE 92 11/07/2018 1019   GLUCOSE 85 12/28/2016 1002   GLUCOSE 84 03/28/2013 0826   BUN 13 11/07/2018 1019   BUN 20.3 12/28/2016 1002   CREATININE 0.82 11/07/2018 1019   CREATININE 0.9 12/28/2016 1002   CALCIUM 9.1 11/07/2018 1019   CALCIUM 10.1 12/28/2016 1002   PROT 5.8 (L) 11/07/2018 1019   PROT 6.8 12/28/2016 1002   ALBUMIN 3.7 11/07/2018 1019   ALBUMIN 4.2 12/28/2016 1002   AST 17 11/07/2018 1019   AST 20 12/28/2016 1002   ALT 14 11/07/2018 1019   ALT 12 12/28/2016 1002   ALKPHOS 117  11/07/2018 1019   ALKPHOS 111 12/28/2016 1002   BILITOT 0.5 11/07/2018 1019   BILITOT 0.55 12/28/2016 1002   GFRNONAA >60 11/07/2018 1019   GFRAA >60 11/07/2018 1019    No results found for: SPEP, UPEP  Lab Results  Component Value Date   WBC 3.6 (L) 11/07/2018   NEUTROABS 1.6 (L) 11/07/2018   HGB 12.1 11/07/2018   HCT 36.6 11/07/2018   MCV 104.3 (H) 11/07/2018   PLT 202 11/07/2018      Chemistry      Component Value Date/Time   NA 142 11/07/2018 1019   NA 142 12/28/2016 1002   K 3.9 11/07/2018 1019   K 4.6 12/28/2016 1002   CL 105 11/07/2018 1019   CL 101 03/28/2013 0826   CO2 27 11/07/2018 1019   CO2 29 12/28/2016 1002   BUN 13 11/07/2018 1019   BUN 20.3 12/28/2016 1002   CREATININE 0.82 11/07/2018 1019   CREATININE 0.9 12/28/2016 1002      Component Value Date/Time   CALCIUM 9.1 11/07/2018 1019   CALCIUM 10.1 12/28/2016 1002   ALKPHOS 117 11/07/2018 1019   ALKPHOS 111 12/28/2016 1002   AST 17 11/07/2018 1019   AST 20 12/28/2016 1002   ALT 14 11/07/2018 1019   ALT 12 12/28/2016 1002   BILITOT 0.5 11/07/2018 1019   BILITOT 0.55 12/28/2016 1002       RADIOGRAPHIC STUDIES: I have personally reviewed the radiological images as listed and agreed with the findings in the report. Dg Chest 2 View  Result Date: 10/24/2018 CLINICAL DATA:  Productive cough for 15 days.  COPD. EXAM: CHEST - 2 VIEW COMPARISON:  12/24/2017 FINDINGS: The heart size and mediastinal contours are within normal limits. Aortic atherosclerosis. Right-sided power port remains in appropriate position. Marked pulmonary hyperinflation is seen, consistent with COPD. Mild scarring noted in the right middle lobe. No evidence of pulmonary infiltrate or edema. No evidence of pleural effusion. Intramedullary rod again noted in the right humerus. IMPRESSION: COPD.  No active disease. Electronically Signed   By: Earle Gell M.D.   On: 10/24/2018 08:40    All questions were answered. The patient knows to  call the clinic with any problems, questions or concerns. No barriers to learning was detected.  I spent 15 minutes counseling the patient face to face. The total time spent in the appointment was 20 minutes and more than 50% was on counseling and review of test results  [name], MD 11/07/2018 12:06 PM

## 2018-11-13 ENCOUNTER — Telehealth: Payer: Self-pay

## 2018-11-13 NOTE — Telephone Encounter (Signed)
Called and left a message asking her to call the office. 

## 2018-11-13 NOTE — Telephone Encounter (Signed)
Called back and given below message. She verbalized understanding. She still has a few sore spots on her gums and a her tongue in the mornings has a coating. Diarrhea has gone away.  Per Dr. Alvy Bimler, instructed to take Magic mouth wash 4 times a day, swish and spit for 7 days. Offered to send Rx, she declined. She has left over Magic mouth wash in the refrigerator that will take. Instructed her to call if needed and the office will call back next Monday regarding restarting Revlimid. She verbalized understanding.  Per husband, who got on speaker phone during call. When he talked with the pharmacist they suggested Dexamethasone elixir. Just FYI.

## 2018-11-13 NOTE — Telephone Encounter (Signed)
-----   Message from [name], MD sent at 11/13/2018  8:55 AM EST ----- Regarding: mouth sores Can you call and ask her if her mouth sores are better? If better: restart Revlimid If not better, recommend her to see her dentist

## 2018-11-14 ENCOUNTER — Other Ambulatory Visit: Payer: Self-pay | Admitting: Family Medicine

## 2018-11-14 DIAGNOSIS — J449 Chronic obstructive pulmonary disease, unspecified: Secondary | ICD-10-CM

## 2018-11-14 NOTE — Telephone Encounter (Signed)
Cory, I don't see that you have filled this in the past.  Please advise. 

## 2018-11-14 NOTE — Telephone Encounter (Signed)
This was filled a month ago. Is she having to use her rescue inhaler daily with Breo?

## 2018-11-17 ENCOUNTER — Telehealth: Payer: Self-pay | Admitting: *Deleted

## 2018-11-17 ENCOUNTER — Other Ambulatory Visit: Payer: Self-pay | Admitting: Adult Health

## 2018-11-17 DIAGNOSIS — F329 Major depressive disorder, single episode, unspecified: Secondary | ICD-10-CM

## 2018-11-17 DIAGNOSIS — F32A Depression, unspecified: Secondary | ICD-10-CM

## 2018-11-17 DIAGNOSIS — Z760 Encounter for issue of repeat prescription: Secondary | ICD-10-CM

## 2018-11-17 NOTE — Telephone Encounter (Signed)
-----   Message from [name], MD sent at 11/17/2018  9:03 AM EST ----- Regarding: mouth sores Can you and ask how she is doing? If mouth sores resolved, tell her she can restart her Revlimid If not, continue MMW and we will call her again next week

## 2018-11-17 NOTE — Telephone Encounter (Signed)
Informed pt of Dr Calton Dach message.

## 2018-11-17 NOTE — Telephone Encounter (Signed)
I have not scheduled anything because she couldn't restart on Revlimid My plan is to see her within a week after she resumes chemo We will call her next week again

## 2018-11-17 NOTE — Telephone Encounter (Signed)
Left a message for a return call.

## 2018-11-17 NOTE — Telephone Encounter (Signed)
Called pt per Dr Alvy Bimler request & she states that mouth is better but still has a couple of sore spots.  Suggested she cont with MMW & we will touch base with her next week.  She wanted to know when her next appt was.  I do not see anything scheduled.  Message to Dr Alvy Bimler.

## 2018-11-17 NOTE — Telephone Encounter (Signed)
Pt called back - states she is using Breo and she doesn't have to use her rescue inhaler.  Pt states she has it on hand in case, but doesn't need it refilled at this time.

## 2018-11-23 ENCOUNTER — Telehealth: Payer: Self-pay | Admitting: Hematology and Oncology

## 2018-11-23 ENCOUNTER — Telehealth: Payer: Self-pay

## 2018-11-23 NOTE — Telephone Encounter (Signed)
-----   Message from [name], MD sent at 11/23/2018 10:44 AM EST ----- Regarding: how is she doing Can you call if her mouth sores are gone? Did she see her dentist? If it's gone, she can resume revlimid If not gone, do not restart. We will call her again next week

## 2018-11-23 NOTE — Telephone Encounter (Signed)
I will schedule appt to see her next week

## 2018-11-23 NOTE — Telephone Encounter (Signed)
Called with below message. She restarted to Revlimid on Sunday 2/8 after her mouth was clear. She noticed she has some tenderness in spots on gums yesterday, no mouth sores. She never did make a dental appt. She states that she will call tomorrow and schedule appt with dentist. Suggested that she try using a soft bristle tooth brush. She likes a medium brush and does not want to try a soft bristle.

## 2018-11-23 NOTE — Telephone Encounter (Signed)
Called and given below message. She verbalized understanding. 

## 2018-11-23 NOTE — Telephone Encounter (Signed)
Scheduled appt per 2/13 sch message - pt is aware of appt date and time   

## 2018-11-27 ENCOUNTER — Other Ambulatory Visit: Payer: Self-pay | Admitting: Hematology and Oncology

## 2018-11-27 DIAGNOSIS — C8338 Diffuse large B-cell lymphoma, lymph nodes of multiple sites: Secondary | ICD-10-CM

## 2018-11-30 ENCOUNTER — Telehealth: Payer: Self-pay | Admitting: Hematology and Oncology

## 2018-11-30 ENCOUNTER — Other Ambulatory Visit: Payer: Self-pay | Admitting: Hematology and Oncology

## 2018-11-30 ENCOUNTER — Inpatient Hospital Stay: Payer: Medicare Other | Attending: Hematology and Oncology | Admitting: Hematology and Oncology

## 2018-11-30 ENCOUNTER — Inpatient Hospital Stay: Payer: Medicare Other

## 2018-11-30 DIAGNOSIS — C8338 Diffuse large B-cell lymphoma, lymph nodes of multiple sites: Secondary | ICD-10-CM | POA: Insufficient documentation

## 2018-11-30 DIAGNOSIS — K1231 Oral mucositis (ulcerative) due to antineoplastic therapy: Secondary | ICD-10-CM | POA: Diagnosis not present

## 2018-11-30 DIAGNOSIS — D61818 Other pancytopenia: Secondary | ICD-10-CM

## 2018-11-30 DIAGNOSIS — C8588 Other specified types of non-Hodgkin lymphoma, lymph nodes of multiple sites: Secondary | ICD-10-CM

## 2018-11-30 LAB — COMPREHENSIVE METABOLIC PANEL
ALT: 14 U/L (ref 0–44)
AST: 19 U/L (ref 15–41)
Albumin: 3.9 g/dL (ref 3.5–5.0)
Alkaline Phosphatase: 134 U/L — ABNORMAL HIGH (ref 38–126)
Anion gap: 10 (ref 5–15)
BUN: 13 mg/dL (ref 8–23)
CO2: 30 mmol/L (ref 22–32)
Calcium: 9.1 mg/dL (ref 8.9–10.3)
Chloride: 102 mmol/L (ref 98–111)
Creatinine, Ser: 0.88 mg/dL (ref 0.44–1.00)
GFR calc Af Amer: >60 mL/min (ref >60)
GFR calc non Af Amer: >60 mL/min (ref >60)
Glucose, Bld: 85 mg/dL (ref 70–99)
Potassium: 4.3 mmol/L (ref 3.5–5.1)
SODIUM: 142 mmol/L (ref 135–145)
Total Bilirubin: 0.6 mg/dL (ref 0.3–1.2)
Total Protein: 6.1 g/dL — ABNORMAL LOW (ref 6.5–8.1)

## 2018-11-30 LAB — CBC WITH DIFFERENTIAL/PLATELET
Abs Immature Granulocytes: 0.03 10*3/uL (ref 0.00–0.07)
Basophils Absolute: 0 10*3/uL (ref 0.0–0.1)
Basophils Relative: 1 %
Eosinophils Absolute: 0.5 10*3/uL (ref 0.0–0.5)
Eosinophils Relative: 8 %
HCT: 39.2 % (ref 36.0–46.0)
Hemoglobin: 12.8 g/dL (ref 12.0–15.0)
Immature Granulocytes: 1 %
LYMPHS ABS: 0.9 10*3/uL (ref 0.7–4.0)
Lymphocytes Relative: 15 %
MCH: 34.3 pg — AB (ref 26.0–34.0)
MCHC: 32.7 g/dL (ref 30.0–36.0)
MCV: 105.1 fL — ABNORMAL HIGH (ref 80.0–100.0)
MONO ABS: 0.6 10*3/uL (ref 0.1–1.0)
Monocytes Relative: 10 %
Neutro Abs: 4 10*3/uL (ref 1.7–7.7)
Neutrophils Relative %: 65 %
Platelets: 226 10*3/uL (ref 150–400)
RBC: 3.73 MIL/uL — ABNORMAL LOW (ref 3.87–5.11)
RDW: 14.1 % (ref 11.5–15.5)
WBC: 6.1 10*3/uL (ref 4.0–10.5)
nRBC: 0 % (ref 0.0–0.2)

## 2018-11-30 LAB — URIC ACID: Uric Acid, Serum: 6.2 mg/dL (ref 2.5–7.1)

## 2018-11-30 NOTE — Telephone Encounter (Signed)
Gave avs and calendar ° °

## 2018-12-01 ENCOUNTER — Encounter: Payer: Self-pay | Admitting: Hematology and Oncology

## 2018-12-01 NOTE — Progress Notes (Signed)
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  I would like to avoid using Magic mouthwash on a regular basis   No orders of the defined types were placed in this encounter.   INTERVAL HISTORY: Please see below for problem oriented charting. 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  Presented w bulky adenopathy, splenomegaly and bone marrow involvement c/w  B-cell nonHodgkins lymphoma, low grade mixed follicular and diffuse. She was treated with chlorambucil in 2000.   --2007 - 09/2008.  Treated with single agent Rituxan.   --12/2008.  She had progressive adenopathy in chest and abdomen and repeat biopsy showed low grade follicular and diffuse B cell NHL.   --01/2009 - 07/2009. She required left ureteral stent and was treated with Treanda/Rituxan x 5 cycles from 02-2009 thru 06-2009.     09/16/2014 Imaging    No lymphadenopathy in the chest, abdomen, or pelvis. The small right axillary lymph nodes seen on the previous exam are unchanged in the 18 month interval. Small to moderate hiatal hernia.    11/20/2017 Imaging    Mildly displaced and angulated pathologic fracture of the proximal humeral shaft. 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     11/22/2017 PET scan    New hypermetabolic lymphadenopathy within the chest and abdomen, consistent with recurrent lymphoma. New large hypermetabolic mass in the right hepatic lobe, consistent with lymphoma this involvement. New small hypermetabolic mass in the left kidney, which may be due to lymphomatous involvement although primary renal cell carcinoma cannot be excluded. Hypermetabolic mass in the right humerus with pathologic fracture, consistent with lymphomatous involvement. Small hypermetabolic foci in right posterior chest wall, also suspicious for recurrent lymphoma.    11/23/2017 Procedure    Bone, fragment(s), Right Humeral Shaft Reamings - ATYPICAL LYMPHOID INFILTRATE CONSISTENT WITH NON-HODGKIN'S B CELL LYMPHOMA. - SEE ONCOLOGY TABLE. Microscopic Comment LYMPHOMA Histologic type: Non-Hodgkins lymphoma, favor large cell type. Grade (if applicable): Favor high grade. Flow cytometry: No specimen is available for analysis. Immunohistochemical stains: BCL-2, BCL-6, CD3, CD5, CD10,  CD15, CD20, CD30, CD34, CD43, LCA, CD79a, CD138, Ki-67, PAX-5 and TdT with appropriate  controls as performed on block 1C. Touch preps/imprints: Not performed. Comments: The sections show multiple, primarily soft tissue fragments displaying a dense lymphoid infiltrate characterized by a mixture of small round to slightly irregular lymphocytes, histiocytes, and large centroblastic appearing lymphoid cells with partially clumped to vesicular chromatin, small nucleoli and amphophilic to clear cytoplasm. The number of admixed large lymphoid cells is variable but they appear relatively abundant in some areas associated with clustering. No material is available for flow cytometric analysis. Hence, a large batter of immunohistochemical stains were performed and show a mixture of T and B cells but with a significant B cell component mostly composed of large lymphoid cells with positivity for CD79a, PAX-5, CD30, BCL-2, and BCL-6. LCA shows diffuse staining although some of the large lymphoid cells appear negative. No significant staining is seen with CD20, CD10, CD15, CD34, TdT, or CD138. The lack of CD20 expression in this material is possibly related to prior treatment. There is variable increased expression of Ki-67 ranging from 10 to >50% in some areas particularly where there is abundance of large lymphoid cells. There is admixed abundant T cell population in the background as seen with CD3, CD5, and CD43 with on apparent co-expression of CD5 in B cell areas. The overall findings are consistent with involvement by non-Hodgkin's B cell lymphoma and the presence of relative abundance of large lymphoid B-cells and increased Ki-67 expression favor a high grade large B cell lymphoma.    11/23/2017 Surgery    1. CPT 24516-Intramedullary nailing of right humerus fracture 2. CPT 20245-Bone biopsy, deep      11/23/2017 - 11/25/2017 Hospital Admission    She was admitted to the hospital for management of humerus fracture and aggressive lymphoma    11/24/2017 Imaging    ECHO showed LV EF: 65% -   70%     11/25/2017 Procedure    Status post ultrasound-guided biopsy of liver mass. Tissue specimen sent to pathology for complete histopathologic analysis    12/07/2017 Imaging    Successful placement of a right internal jugular approach power injectable Port-A-Cath. The catheter is ready for immediate use.    12/09/2017 - 03/29/2018 Chemotherapy    The patient had R-CHOP chemo      12/17/2017 - 12/20/2017 Hospital Admission    She was hospitalized for influenza infection    01/03/2018 Procedure    Unsuccessful initial L4-5 LP. Successful L2-3 lumbar puncture with installation of methotrexate.    01/24/2018 Procedure    Non complicated injection of intrathecal methotrexate, as above.    02/09/2018 Imaging    LV EF: 55% -  60%    02/10/2018 PET scan    1. Interval response to therapy. There has been resolution of previous hypermetabolic tumor within the chest and abdomen. Deauville criteria 2 and 3. 2. Diffuse radiotracer uptake is identified within the axial and appendicular skeleton which is favored to represent treatment related changes. 3. Significant decrease in FDG uptake associated with previously noted hypermetabolic tumor involving the proximal humeral diaphysis. The FDG uptake within this area on today's study is equivalent to that which is seen in the lumbar spine. 4. Aortic Atherosclerosis (ICD10-I70.0). Lad coronary artery calcifications. 5. Hiatal hernia    02/15/2018 Procedure    Fluoroscopic guided lumbar puncture and intrathecal injection of chemotherapy without complication.    03/31/2018 Procedure    Technically successful fluoroscopic guided lumbar puncture for intrathecal methotrexate injection.  Procedure was well tolerated without evidence for immediate complications. Patient will recover in short stay prior to being discharged home    05/08/2018 PET scan    No residual/persistent enlarged or hypermetabolic lymph nodes in the neck, chest, abdomen or pelvis. Findings suggest an  excellent response to treatment.    05/25/2018 -  Chemotherapy    The patient had Revlimid for maintenance chemo     06/07/2018 Imaging    LV EF: 55% -  60%    09/25/2018 PET scan    1. Stable exam. No hypermetabolic lymphadenopathy in the neck, chest, abdomen, or pelvis. 2.  Aortic Atherosclerois (ICD10-170.0) 3.  Emphysema. ([id number])      REVIEW OF SYSTEMS:   Constitutional: Denies fevers, chills or abnormal weight loss Eyes: Denies blurriness of vision Respiratory: Denies cough, dyspnea or wheezes Cardiovascular: Denies palpitation, chest discomfort or lower extremity swelling Gastrointestinal:  Denies nausea, heartburn or change in bowel habits Skin: Denies abnormal skin rashes Lymphatics: Denies new lymphadenopathy or easy bruising Neurological:Denies numbness, tingling or new weaknesses Behavioral/Psych: Mood is stable, no new changes  All other systems were reviewed with the patient and are negative.  I have reviewed the past medical history, past surgical history, social history and family history with the patient and they are unchanged from previous note.  ALLERGIES:  is allergic to contrast media [iodinated diagnostic agents]; iohexol; povidone-iodine; penicillins; and adhesive [tape].  MEDICATIONS:  Current Outpatient Medications  Medication Sig Dispense Refill  . acetaminophen (TYLENOL) 325 MG tablet Take 2 tablets (650 mg total) by mouth every 6 (six) hours as needed for mild pain (or Fever >/= 101).    Marland Kitchen albuterol (PROVENTIL HFA;VENTOLIN HFA) 108 (90 Base) MCG/ACT inhaler Inhale 2 puffs into the lungs every 6 (six) hours as needed for wheezing or shortness of breath. 1 Inhaler 0  . aspirin 81 MG tablet Take 81 mg by mouth daily.    Marland Kitchen BREO ELLIPTA 100-25 MCG/INH AEPB ONE INHALTION INTO LUNGS EVERY DAY 60 each 6  . cholecalciferol (VITAMIN D) 1000 units tablet Take 2,000 Units by mouth daily.    . citalopram (CELEXA) 40 MG tablet TAKE 1 TABLET (40 MG TOTAL) BY  MOUTH DAILY. **DUE FOR PHYSICAL WITH CORY** 30 tablet 0  . lenalidomide (REVLIMID) 10 MG capsule TAKE 1 CAPSULE BY MOUTH  ONCE DAILY FOR 21 DAYS ON,  7 DAYS OFF. REPEAT EVERY 28 DAYS 21 capsule 11  . magic mouthwash w/lidocaine SOLN Take 5 mLs by mouth 4 (four) times daily. 240 mL 0  . pantoprazole (PROTONIX) 40 MG tablet Take 1 tablet (40 mg total) by mouth daily. 180 tablet 3   No current facility-administered medications for this visit.     PHYSICAL EXAMINATION: ECOG PERFORMANCE STATUS: 1 - Symptomatic but completely ambulatory  Vitals:   11/30/18 1241  BP: 99/62  Pulse: 96  Resp: 18  Temp: (!) 97.4 F (36.3 C)  SpO2: 98%   Filed Weights   11/30/18 1241  Weight: 116 lb 3.2 oz (52.7 kg)    GENERAL:alert, no distress and comfortable SKIN: skin color, texture, turgor are normal, no rashes or significant lesions EYES: normal, Conjunctiva are pink and non-injected, sclera clear OROPHARYNX:no exudate, no erythema and lips, buccal mucosa, and tongue normal  NECK: supple, thyroid normal size, non-tender, without nodularity LYMPH:  no palpable lymphadenopathy in the cervical, axillary or inguinal LUNGS: clear to auscultation and percussion with normal breathing effort HEART: regular rate & rhythm and no murmurs and no lower extremity  edema ABDOMEN:abdomen soft, non-tender and normal bowel sounds Musculoskeletal:no cyanosis of digits and no clubbing  NEURO: alert & oriented x 3 with fluent speech, no focal motor/sensory deficits  LABORATORY DATA:  I have reviewed the data as listed    Component Value Date/Time   NA 142 11/30/2018 1216   NA 142 12/28/2016 1002   K 4.3 11/30/2018 1216   K 4.6 12/28/2016 1002   CL 102 11/30/2018 1216   CL 101 03/28/2013 0826   CO2 30 11/30/2018 1216   CO2 29 12/28/2016 1002   GLUCOSE 85 11/30/2018 1216   GLUCOSE 85 12/28/2016 1002   GLUCOSE 84 03/28/2013 0826   BUN 13 11/30/2018 1216   BUN 20.3 12/28/2016 1002   CREATININE 0.88 11/30/2018  1216   CREATININE 0.82 11/07/2018 1019   CREATININE 0.9 12/28/2016 1002   CALCIUM 9.1 11/30/2018 1216   CALCIUM 10.1 12/28/2016 1002   PROT 6.1 (L) 11/30/2018 1216   PROT 6.8 12/28/2016 1002   ALBUMIN 3.9 11/30/2018 1216   ALBUMIN 4.2 12/28/2016 1002   AST 19 11/30/2018 1216   AST 17 11/07/2018 1019   AST 20 12/28/2016 1002   ALT 14 11/30/2018 1216   ALT 14 11/07/2018 1019   ALT 12 12/28/2016 1002   ALKPHOS 134 (H) 11/30/2018 1216   ALKPHOS 111 12/28/2016 1002   BILITOT 0.6 11/30/2018 1216   BILITOT 0.5 11/07/2018 1019   BILITOT 0.55 12/28/2016 1002   GFRNONAA >60 11/30/2018 1216   GFRNONAA >60 11/07/2018 1019   GFRAA >60 11/30/2018 1216   GFRAA >60 11/07/2018 1019    No results found for: SPEP, UPEP  Lab Results  Component Value Date   WBC 6.1 11/30/2018   NEUTROABS 4.0 11/30/2018   HGB 12.8 11/30/2018   HCT 39.2 11/30/2018   MCV 105.1 (H) 11/30/2018   PLT 226 11/30/2018      Chemistry      Component Value Date/Time   NA 142 11/30/2018 1216   NA 142 12/28/2016 1002   K 4.3 11/30/2018 1216   K 4.6 12/28/2016 1002   CL 102 11/30/2018 1216   CL 101 03/28/2013 0826   CO2 30 11/30/2018 1216   CO2 29 12/28/2016 1002   BUN 13 11/30/2018 1216   BUN 20.3 12/28/2016 1002   CREATININE 0.88 11/30/2018 1216   CREATININE 0.82 11/07/2018 1019   CREATININE 0.9 12/28/2016 1002      Component Value Date/Time   CALCIUM 9.1 11/30/2018 1216   CALCIUM 10.1 12/28/2016 1002   ALKPHOS 134 (H) 11/30/2018 1216   ALKPHOS 111 12/28/2016 1002   AST 19 11/30/2018 1216   AST 17 11/07/2018 1019   AST 20 12/28/2016 1002   ALT 14 11/30/2018 1216   ALT 14 11/07/2018 1019   ALT 12 12/28/2016 1002   BILITOT 0.6 11/30/2018 1216   BILITOT 0.5 11/07/2018 1019   BILITOT 0.55 12/28/2016 1002       All questions were answered. The patient knows to call the clinic with any problems, questions or concerns. No barriers to learning was detected.  I spent 15 minutes counseling the patient  face to face. The total time spent in the appointment was 20 minutes and more than 50% was on counseling and review of test results  [name], MD 12/01/2018 7:20 AM

## 2018-12-01 NOTE — Assessment & Plan Note (Signed)
She has recurrent mucositis due to side effects of treatment Careful examination of her oral cavity does not disclose any evidence of blister, sores or thrush I recommend 1 week break from treatment and resume next week We recommend conservative management with salt water gargle only or Biotene as needed.  I would like to avoid using Magic mouthwash on a regular basis

## 2018-12-01 NOTE — Assessment & Plan Note (Signed)
With recent dose modification, she is not pancytopenic.  She will continue reduced dose therapy and close monitoring of blood work

## 2018-12-01 NOTE — Assessment & Plan Note (Signed)
Despite recent dose modification, she continues to have intermittent mucositis, starting approximately a week after resumption of Revlimid We discussed the risk and benefits of continuing maintenance treatment versus stopping The patient and her husband would like to continue to try doses modification Her last imaging study showed no evidence of lymphoma recurrence

## 2018-12-06 ENCOUNTER — Other Ambulatory Visit: Payer: Self-pay

## 2018-12-06 DIAGNOSIS — C8338 Diffuse large B-cell lymphoma, lymph nodes of multiple sites: Secondary | ICD-10-CM

## 2018-12-06 MED ORDER — LENALIDOMIDE 10 MG PO CAPS: ORAL_CAPSULE | ORAL | 11 refills | Status: DC

## 2018-12-10 ENCOUNTER — Other Ambulatory Visit: Payer: Self-pay | Admitting: Adult Health

## 2018-12-10 DIAGNOSIS — F329 Major depressive disorder, single episode, unspecified: Secondary | ICD-10-CM

## 2018-12-10 DIAGNOSIS — F32A Depression, unspecified: Secondary | ICD-10-CM

## 2018-12-10 DIAGNOSIS — Z760 Encounter for issue of repeat prescription: Secondary | ICD-10-CM

## 2018-12-12 NOTE — Telephone Encounter (Signed)
Denied.  Pt needs cpx and lab work.

## 2018-12-14 ENCOUNTER — Other Ambulatory Visit: Payer: Self-pay | Admitting: Hematology and Oncology

## 2018-12-14 ENCOUNTER — Inpatient Hospital Stay: Payer: Medicare Other | Attending: Hematology and Oncology | Admitting: Hematology and Oncology

## 2018-12-14 ENCOUNTER — Encounter: Payer: Self-pay | Admitting: Hematology and Oncology

## 2018-12-14 ENCOUNTER — Inpatient Hospital Stay: Payer: Medicare Other

## 2018-12-14 ENCOUNTER — Telehealth: Payer: Self-pay | Admitting: Hematology and Oncology

## 2018-12-14 ENCOUNTER — Ambulatory Visit (HOSPITAL_COMMUNITY)
Admission: RE | Admit: 2018-12-14 | Discharge: 2018-12-14 | Disposition: A | Discharge status: Home / Self Care | Payer: Medicare Other | Source: Ambulatory Visit | Attending: Hematology and Oncology | Admitting: Hematology and Oncology

## 2018-12-14 DIAGNOSIS — D61818 Other pancytopenia: Secondary | ICD-10-CM

## 2018-12-14 DIAGNOSIS — K1231 Oral mucositis (ulcerative) due to antineoplastic therapy: Secondary | ICD-10-CM | POA: Diagnosis not present

## 2018-12-14 DIAGNOSIS — C8338 Diffuse large B-cell lymphoma, lymph nodes of multiple sites: Secondary | ICD-10-CM | POA: Diagnosis not present

## 2018-12-14 DIAGNOSIS — M79601 Pain in right arm: Secondary | ICD-10-CM | POA: Diagnosis not present

## 2018-12-14 DIAGNOSIS — M79602 Pain in left arm: Secondary | ICD-10-CM | POA: Insufficient documentation

## 2018-12-14 DIAGNOSIS — C8588 Other specified types of non-Hodgkin lymphoma, lymph nodes of multiple sites: Secondary | ICD-10-CM

## 2018-12-14 LAB — COMPREHENSIVE METABOLIC PANEL
ALT: 15 U/L (ref 0–44)
ANION GAP: 6 (ref 5–15)
AST: 19 U/L (ref 15–41)
Albumin: 3.7 g/dL (ref 3.5–5.0)
Alkaline Phosphatase: 121 U/L (ref 38–126)
BUN: 13 mg/dL (ref 8–23)
CO2: 29 mmol/L (ref 22–32)
Calcium: 8.8 mg/dL — ABNORMAL LOW (ref 8.9–10.3)
Chloride: 103 mmol/L (ref 98–111)
Creatinine, Ser: 0.74 mg/dL (ref 0.44–1.00)
GFR calc Af Amer: >60 mL/min (ref >60)
GFR calc non Af Amer: >60 mL/min (ref >60)
Glucose, Bld: 80 mg/dL (ref 70–99)
Potassium: 3.8 mmol/L (ref 3.5–5.1)
Sodium: 138 mmol/L (ref 135–145)
Total Bilirubin: 0.3 mg/dL (ref 0.3–1.2)
Total Protein: 5.9 g/dL — ABNORMAL LOW (ref 6.5–8.1)

## 2018-12-14 LAB — CBC WITH DIFFERENTIAL/PLATELET
Abs Immature Granulocytes: 0.01 10*3/uL (ref 0.00–0.07)
Basophils Absolute: 0 10*3/uL (ref 0.0–0.1)
Basophils Relative: 1 %
EOS ABS: 0.5 10*3/uL (ref 0.0–0.5)
EOS PCT: 15 %
HCT: 36.5 % (ref 36.0–46.0)
Hemoglobin: 12.2 g/dL (ref 12.0–15.0)
Immature Granulocytes: 0 %
Lymphocytes Relative: 26 %
Lymphs Abs: 0.9 10*3/uL (ref 0.7–4.0)
MCH: 34.6 pg — ABNORMAL HIGH (ref 26.0–34.0)
MCHC: 33.4 g/dL (ref 30.0–36.0)
MCV: 103.4 fL — AB (ref 80.0–100.0)
Monocytes Absolute: 0.4 10*3/uL (ref 0.1–1.0)
Monocytes Relative: 11 %
Neutro Abs: 1.7 10*3/uL (ref 1.7–7.7)
Neutrophils Relative %: 47 %
Platelets: 199 10*3/uL (ref 150–400)
RBC: 3.53 MIL/uL — ABNORMAL LOW (ref 3.87–5.11)
RDW: 13.9 % (ref 11.5–15.5)
WBC: 3.5 10*3/uL — ABNORMAL LOW (ref 4.0–10.5)
nRBC: 0 % (ref 0.0–0.2)

## 2018-12-14 LAB — URIC ACID: Uric Acid, Serum: 5.5 mg/dL (ref 2.5–7.1)

## 2018-12-14 MED ORDER — SODIUM CHLORIDE 0.9 % IJ SOLN: Freq: Once | INTRAMUSCULAR | Status: DC

## 2018-12-14 MED ORDER — HEPARIN SOD (PORK) LOCK FLUSH 100 UNIT/ML IV SOLN
250.0000 [IU] | Freq: Once | INTRAVENOUS | Status: DC
  Filled 2018-12-14: qty 5

## 2018-12-14 MED ORDER — HEPARIN SOD (PORK) LOCK FLUSH 100 UNIT/ML IV SOLN
500.0000 [IU] | Freq: Once | INTRAVENOUS | Status: AC
  Administered 2018-12-14: 500 [IU]
  Filled 2018-12-14: qty 5

## 2018-12-14 MED ORDER — LENALIDOMIDE 10 MG PO CAPS: ORAL_CAPSULE | ORAL | 11 refills | Status: DC

## 2018-12-14 MED ORDER — SODIUM CHLORIDE 0.9% FLUSH
10.0000 mL | Freq: Once | INTRAVENOUS | Status: AC
  Administered 2018-12-14: 10 mL
  Filled 2018-12-14: qty 10

## 2018-12-14 NOTE — Assessment & Plan Note (Signed)
With recent dose modification, she is not pancytopenic.  She will continue reduced dose therapy

## 2018-12-14 NOTE — Assessment & Plan Note (Signed)
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  I would like to avoid using Magic mouthwash on a regular basis 

## 2018-12-14 NOTE — Progress Notes (Signed)
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  I would like to avoid using Magic mouthwash on a regular basis  Pancytopenia, acquired ([place]) With recent dose modification, she is not pancytopenic.  She will continue reduced dose therapy  Bilateral arm pain She has history of pathological fracture on the right arm but complained of left arm pain recently I recommend x-ray evaluation.  She agreed   Orders Placed This Encounter  Procedures  . DG Humerus Left    Standing Status:   Future    Number of Occurrences:   1    Standing Expiration Date:   12/14/2019    Order Specific Question:   Reason for Exam (SYMPTOM  OR DIAGNOSIS REQUIRED)    Answer:   left arm pain    Order Specific Question:   Preferred imaging location?    Answer:   [facility]    Order Specific Question:   Radiology Contrast Protocol - do NOT remove file path    Answer:   _0 [email or web address]  . DG Humerus Right    Standing Status:   Future    Number of Occurrences:   1    Standing Expiration Date:   12/14/2019    Order Specific Question:   Reason for Exam (SYMPTOM  OR DIAGNOSIS REQUIRED)    Answer:   prior fracture of arm, arm pain    Order Specific Question:   Preferred imaging  location?    Answer:   [facility]    Order Specific Question:   Radiology Contrast Protocol - do NOT remove file path    Answer:   _1 [email or web address]    INTERVAL HISTORY: Please see below for problem oriented charting. 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 She was treated with chlorambucil in 2000.   --2007 - 09/2008.  Treated with single agent Rituxan.   --12/2008.  She had progressive adenopathy in chest and abdomen and repeat biopsy showed low grade follicular and diffuse B cell NHL.   --01/2009 - 07/2009. She required left ureteral stent and was treated with Treanda/Rituxan x 5 cycles from 02-2009 thru 06-2009.     09/16/2014 Imaging  No lymphadenopathy in the chest, abdomen, or pelvis. The small right axillary lymph nodes seen on the previous exam are unchanged in the 18 month interval. Small to moderate hiatal hernia.    11/20/2017 Imaging    Mildly displaced and angulated pathologic fracture of the proximal humeral shaft. 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     11/22/2017 PET scan    New hypermetabolic lymphadenopathy within the chest and abdomen, consistent with recurrent lymphoma. New large hypermetabolic mass in the right hepatic lobe,  consistent with lymphoma this involvement. New small hypermetabolic mass in the left kidney, which may be due to lymphomatous involvement although primary renal cell carcinoma cannot be excluded. Hypermetabolic mass in the right humerus with pathologic fracture, consistent with lymphomatous involvement. Small hypermetabolic foci in right posterior chest wall, also suspicious for recurrent lymphoma.    11/23/2017 Procedure    Bone, fragment(s), Right Humeral Shaft Reamings - ATYPICAL LYMPHOID INFILTRATE CONSISTENT WITH NON-HODGKIN'S B CELL LYMPHOMA. - SEE ONCOLOGY TABLE. Microscopic Comment LYMPHOMA Histologic type: Non-Hodgkins lymphoma, favor large cell type. Grade (if applicable): Favor high grade. Flow cytometry: No specimen is available for analysis. Immunohistochemical stains: BCL-2, BCL-6, CD3, CD5, CD10, CD15, CD20, CD30, CD34, CD43, LCA, CD79a, CD138, Ki-67, PAX-5 and TdT with appropriate controls as performed on block 1C. Touch preps/imprints: Not performed. Comments: The sections show multiple, primarily soft tissue fragments displaying a dense lymphoid infiltrate characterized by a mixture of small round to slightly irregular lymphocytes, histiocytes, and large centroblastic appearing lymphoid cells with partially clumped to vesicular chromatin, small nucleoli and amphophilic to clear cytoplasm. The number of admixed large lymphoid cells is variable but they appear relatively abundant in some areas associated with clustering. No material is available for flow cytometric analysis. Hence, a large batter of immunohistochemical stains were performed and show a mixture of T and B cells but with a significant B cell component mostly composed of large lymphoid cells with positivity for CD79a, PAX-5, CD30, BCL-2, and BCL-6. LCA shows diffuse staining although some of the large lymphoid cells appear negative. No significant staining is seen with CD20, CD10, CD15, CD34, TdT, or CD138. The lack of  CD20 expression in this material is possibly related to prior treatment. There is variable increased expression of Ki-67 ranging from 10 to >50% in some areas particularly where there is abundance of large lymphoid cells. There is admixed abundant T cell population in the background as seen with CD3, CD5, and CD43 with on apparent co-expression of CD5 in B cell areas. The overall findings are consistent with involvement by non-Hodgkin's B cell lymphoma and the presence of relative abundance of large lymphoid B-cells and increased Ki-67 expression favor a high grade large B cell lymphoma.    11/23/2017 Surgery    1. CPT 24516-Intramedullary nailing of right humerus fracture 2. CPT 20245-Bone biopsy, deep      11/23/2017 - 11/25/2017 Hospital Admission    She was admitted to the hospital for management of humerus fracture and aggressive lymphoma    11/24/2017 Imaging    ECHO showed LV EF: 65% -   70%    11/25/2017 Procedure    Status post ultrasound-guided biopsy of liver mass. Tissue specimen sent to pathology for complete histopathologic analysis    12/07/2017 Imaging    Successful placement of a right internal jugular approach power injectable Port-A-Cath. The catheter is ready for immediate use.    12/09/2017 - 03/29/2018 Chemotherapy    The patient had R-CHOP chemo  12/17/2017 - 12/20/2017 Hospital Admission    She was hospitalized for influenza infection    01/03/2018 Procedure    Unsuccessful initial L4-5 LP. Successful L2-3 lumbar puncture with installation of methotrexate.    01/24/2018 Procedure    Non complicated injection of intrathecal methotrexate, as above.    02/09/2018 Imaging    LV EF: 55% -  60%    02/10/2018 PET scan    1. Interval response to therapy. There has been resolution of previous hypermetabolic tumor within the chest and abdomen. Deauville criteria 2 and 3. 2. Diffuse radiotracer uptake is identified within the axial and appendicular skeleton which is favored to  represent treatment related changes. 3. Significant decrease in FDG uptake associated with previously noted hypermetabolic tumor involving the proximal humeral diaphysis. The FDG uptake within this area on today's study is equivalent to that which is seen in the lumbar spine. 4. Aortic Atherosclerosis (ICD10-I70.0). Lad coronary artery calcifications. 5. Hiatal hernia    02/15/2018 Procedure    Fluoroscopic guided lumbar puncture and intrathecal injection of chemotherapy without complication.    03/31/2018 Procedure    Technically successful fluoroscopic guided lumbar puncture for intrathecal methotrexate injection. Procedure was well tolerated without evidence for immediate complications. Patient will recover in short stay prior to being discharged home    05/08/2018 PET scan    No residual/persistent enlarged or hypermetabolic lymph nodes in the neck, chest, abdomen or pelvis. Findings suggest an excellent response to treatment.    05/25/2018 -  Chemotherapy    The patient had Revlimid for maintenance chemo     06/07/2018 Imaging    LV EF: 55% -  60%    09/25/2018 PET scan    1. Stable exam. No hypermetabolic lymphadenopathy in the neck, chest, abdomen, or pelvis. 2.  Aortic Atherosclerois (ICD10-170.0) 3.  Emphysema. ([id number])      REVIEW OF SYSTEMS:   Constitutional: Denies fevers, chills or abnormal weight loss Eyes: Denies blurriness of vision Respiratory: Denies cough, dyspnea or wheezes Cardiovascular: Denies palpitation, chest discomfort or lower extremity swelling Gastrointestinal:  Denies nausea, heartburn or change in bowel habits Skin: Denies abnormal skin rashes Lymphatics: Denies new lymphadenopathy or easy bruising Neurological:Denies numbness, tingling or new weaknesses Behavioral/Psych: Mood is stable, no new changes  All other systems were reviewed with the patient and are negative.  I have reviewed the past medical history, past surgical history, social  history and family history with the patient and they are unchanged from previous note.  ALLERGIES:  is allergic to contrast media [iodinated diagnostic agents]; iohexol; povidone-iodine; penicillins; and adhesive [tape].  MEDICATIONS:  Current Outpatient Medications  Medication Sig Dispense Refill  . acetaminophen (TYLENOL) 325 MG tablet Take 2 tablets (650 mg total) by mouth every 6 (six) hours as needed for mild pain (or Fever >/= 101).    Marland Kitchen albuterol (PROVENTIL HFA;VENTOLIN HFA) 108 (90 Base) MCG/ACT inhaler Inhale 2 puffs into the lungs every 6 (six) hours as needed for wheezing or shortness of breath. 1 Inhaler 0  . aspirin 81 MG tablet Take 81 mg by mouth daily.    Marland Kitchen BREO ELLIPTA 100-25 MCG/INH AEPB ONE INHALTION INTO LUNGS EVERY DAY 60 each 6  . cholecalciferol (VITAMIN D) 1000 units tablet Take 2,000 Units by mouth daily.    . citalopram (CELEXA) 40 MG tablet TAKE 1 TABLET (40 MG TOTAL) BY MOUTH DAILY. **DUE FOR PHYSICAL WITH CORY** 30 tablet 0  . lenalidomide (REVLIMID) 10 MG capsule Take 1 capsule daily for 7  days and then take 7 days off. Repeat every 2 weeks for cycle of every 28 days 14 capsule 11  . magic mouthwash w/lidocaine SOLN Take 5 mLs by mouth 4 (four) times daily. 240 mL 0  . pantoprazole (PROTONIX) 40 MG tablet Take 1 tablet (40 mg total) by mouth daily. 180 tablet 3   No current facility-administered medications for this visit.     PHYSICAL EXAMINATION: ECOG PERFORMANCE STATUS: 1 - Symptomatic but completely ambulatory  Vitals:   12/14/18 1217  BP: (!) 120/49  Pulse: (!) 59  Resp: 18  Temp: 97.9 F (36.6 C)  SpO2: 100%   Filed Weights   12/14/18 1217  Weight: 117 lb 9.6 oz (53.3 kg)    GENERAL:alert, no distress and comfortable SKIN: skin color, texture, turgor are normal, no rashes or significant lesions EYES: normal, Conjunctiva are pink and non-injected, sclera clear OROPHARYNX:no exudate, no erythema and lips, buccal mucosa, and tongue normal   NECK: supple, thyroid normal size, non-tender, without nodularity LYMPH:  no palpable lymphadenopathy in the cervical, axillary or inguinal LUNGS: clear to auscultation and percussion with normal breathing effort HEART: regular rate & rhythm and no murmurs and no lower extremity edema ABDOMEN:abdomen soft, non-tender and normal bowel sounds Musculoskeletal:no cyanosis of digits and no clubbing  NEURO: alert & oriented x 3 with fluent speech, no focal motor/sensory deficits  LABORATORY DATA:  I have reviewed the data as listed    Component Value Date/Time   NA 138 12/14/2018 1134   NA 142 12/28/2016 1002   K 3.8 12/14/2018 1134   K 4.6 12/28/2016 1002   CL 103 12/14/2018 1134   CL 101 03/28/2013 0826   CO2 29 12/14/2018 1134   CO2 29 12/28/2016 1002   GLUCOSE 80 12/14/2018 1134   GLUCOSE 85 12/28/2016 1002   GLUCOSE 84 03/28/2013 0826   BUN 13 12/14/2018 1134   BUN 20.3 12/28/2016 1002   CREATININE 0.74 12/14/2018 1134   CREATININE 0.82 11/07/2018 1019   CREATININE 0.9 12/28/2016 1002   CALCIUM 8.8 (L) 12/14/2018 1134   CALCIUM 10.1 12/28/2016 1002   PROT 5.9 (L) 12/14/2018 1134   PROT 6.8 12/28/2016 1002   ALBUMIN 3.7 12/14/2018 1134   ALBUMIN 4.2 12/28/2016 1002   AST 19 12/14/2018 1134   AST 17 11/07/2018 1019   AST 20 12/28/2016 1002   ALT 15 12/14/2018 1134   ALT 14 11/07/2018 1019   ALT 12 12/28/2016 1002   ALKPHOS 121 12/14/2018 1134   ALKPHOS 111 12/28/2016 1002   BILITOT 0.3 12/14/2018 1134   BILITOT 0.5 11/07/2018 1019   BILITOT 0.55 12/28/2016 1002   GFRNONAA >60 12/14/2018 1134   GFRNONAA >60 11/07/2018 1019   GFRAA >60 12/14/2018 1134   GFRAA >60 11/07/2018 1019    No results found for: SPEP, UPEP  Lab Results  Component Value Date   WBC 3.5 (L) 12/14/2018   NEUTROABS 1.7 12/14/2018   HGB 12.2 12/14/2018   HCT 36.5 12/14/2018   MCV 103.4 (H) 12/14/2018   PLT 199 12/14/2018      Chemistry      Component Value Date/Time   NA 138 12/14/2018  1134   NA 142 12/28/2016 1002   K 3.8 12/14/2018 1134   K 4.6 12/28/2016 1002   CL 103 12/14/2018 1134   CL 101 03/28/2013 0826   CO2 29 12/14/2018 1134   CO2 29 12/28/2016 1002   BUN 13 12/14/2018 1134   BUN 20.3 12/28/2016 1002  CREATININE 0.74 12/14/2018 1134   CREATININE 0.82 11/07/2018 1019   CREATININE 0.9 12/28/2016 1002      Component Value Date/Time   CALCIUM 8.8 (L) 12/14/2018 1134   CALCIUM 10.1 12/28/2016 1002   ALKPHOS 121 12/14/2018 1134   ALKPHOS 111 12/28/2016 1002   AST 19 12/14/2018 1134   AST 17 11/07/2018 1019   AST 20 12/28/2016 1002   ALT 15 12/14/2018 1134   ALT 14 11/07/2018 1019   ALT 12 12/28/2016 1002   BILITOT 0.3 12/14/2018 1134   BILITOT 0.5 11/07/2018 1019   BILITOT 0.55 12/28/2016 1002     All questions were answered. The patient knows to call the clinic with any problems, questions or concerns. No barriers to learning was detected.  I spent 20 minutes counseling the patient face to face. The total time spent in the appointment was 30 minutes and more than 50% was on counseling and review of test results  [name], MD 12/14/2018 1:02 PM

## 2018-12-14 NOTE — Assessment & Plan Note (Signed)
She tolerated treatment poorly due to significant mucositis and pancytopenia with treatment I recommend we modify her treatment to take Revlimid 10 mg 7 days on, 7 days off and to repeat every other week, 4 cycles every 28 days She will be due for repeat imaging study in June

## 2018-12-14 NOTE — Assessment & Plan Note (Signed)
She has history of pathological fracture on the right arm but complained of left arm pain recently I recommend x-ray evaluation.  She agreed

## 2018-12-14 NOTE — Telephone Encounter (Signed)
Gave avs and calendar ° °

## 2018-12-15 ENCOUNTER — Telehealth: Payer: Self-pay

## 2018-12-15 NOTE — Telephone Encounter (Signed)
LVM to return call to office

## 2018-12-15 NOTE — Telephone Encounter (Signed)
Spoke with pt by phone and gave results of xrays from 12/14/18.  Pt verbalized no other concerns at this time.

## 2018-12-21 ENCOUNTER — Telehealth: Payer: Self-pay

## 2018-12-21 NOTE — Telephone Encounter (Signed)
Per awv appointment note, pt. Is fasting tmr, expecting to have labs drawn tmr., but no orders in. Routed to PCP to advise.

## 2018-12-21 NOTE — Progress Notes (Deleted)
Subjective:   Brooke Washington is a 78 y.o. female who presents for an Initial Medicare Annual Wellness Visit.  Review of Systems    No ROS.  Medicare Wellness Visit. Additional risk factors are reflected in the social history.     Sleep patterns: {SX; SLEEP PATTERNS:18802::"feels rested on waking","does not get up to void","gets up *** times nightly to void","sleeps *** hours nightly"}.    Home Safety/Smoke Alarms: Feels safe in home. Smoke alarms in place.  Living environment; residence and Firearm Safety: {Rehab home environment / accessibility:30080::"no firearms","firearms stored safely"}. Seat Belt Safety/Bike Helmet: Wears seat belt.   Female:   Pap- N/A d/t age       32-  05/2014, overdue     Dexa scan- none on file.        [facility]- 01/2009, advised by Dr. Olevia Perches to return in 5 years per report.     Objective:    There were no vitals filed for this visit. There is no height or weight on file to calculate BMI.  Advanced Directives 04/19/2018 03/31/2018 02/15/2018 01/24/2018 01/03/2018 12/24/2017 12/18/2017  Does Patient Have a Medical Advance Directive? Yes No Yes No Yes Yes Yes  Type of Advance Directive Living will - [place];Living will [place];Living will [place];Living will [place];Living will Living will;Healthcare Power of Attorney  Does patient want to make changes to medical advance directive? No - Patient declined - No - Patient declined - No - Patient declined - No - Patient declined  Copy of Healthcare Power of Attorney in Chart? - - No - copy requested No - copy requested No - copy requested No - copy requested No - copy requested  Would patient like information on creating a medical advance directive? - No - Patient declined - - - - -    Current Medications (verified) Outpatient Encounter Medications as of 12/22/2018  Medication Sig  . acetaminophen (TYLENOL) 325 MG tablet Take 2  tablets (650 mg total) by mouth every 6 (six) hours as needed for mild pain (or Fever >/= 101).  Marland Kitchen albuterol (PROVENTIL HFA;VENTOLIN HFA) 108 (90 Base) MCG/ACT inhaler Inhale 2 puffs into the lungs every 6 (six) hours as needed for wheezing or shortness of breath.  Marland Kitchen aspirin 81 MG tablet Take 81 mg by mouth daily.  Marland Kitchen BREO ELLIPTA 100-25 MCG/INH AEPB ONE INHALTION INTO LUNGS EVERY DAY  . cholecalciferol (VITAMIN D) 1000 units tablet Take 2,000 Units by mouth daily.  . citalopram (CELEXA) 40 MG tablet TAKE 1 TABLET (40 MG TOTAL) BY MOUTH DAILY. **DUE FOR PHYSICAL WITH CORY**  . lenalidomide (REVLIMID) 10 MG capsule Take 1 capsule daily for 7 days and then take 7 days off. Repeat every 2 weeks for cycle of every 28 days  . magic mouthwash w/lidocaine SOLN Take 5 mLs by mouth 4 (four) times daily.  . pantoprazole (PROTONIX) 40 MG tablet Take 1 tablet (40 mg total) by mouth daily.   No facility-administered encounter medications on file as of 12/22/2018.     Allergies (verified) Contrast media [iodinated diagnostic agents]; Iohexol; Povidone-iodine; Penicillins; and Adhesive [tape]   History: Past Medical History:  Diagnosis Date  . Cancer ([place])  APRIL OF 2000   LOW-GRADE NON-HODGKIN'S LYMPHOMA  . COPD (chronic obstructive pulmonary disease) ([place])   . Emphysema of lung ([place])   . Hyperlipidemia    Past Surgical History:  Procedure Laterality Date  . 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 IR FLUORO GUIDE PORT INSERTION RIGHT  12/07/2017  . IR US GUIDE VASC ACCESS RIGHT  12/07/2017  . ORIF HUMERUS FRACTURE Right 11/23/2017   Procedure: OPEN REDUCTION INTERNAL FIXATION (ORIF) HUMERAL SHAFT FRACTURE;  Surgeon: Shona Needles, MD;  Location: [facility];  Service: Orthopedics;  Laterality: Right;  . RIGHT OOPHORECTOMY  1976   Family History  Problem Relation Age of Onset  . Cancer Mother        colon ca   Social History    Socioeconomic History  . Marital status: Married    Spouse name: Not on file  . Number of children: Not on file  . Years of education: Not on file  . Highest education level: Not on file  Occupational History  . Not on file  Social Needs  . Financial resource strain: Not on file  . Food insecurity:    Worry: Not on file    Inability: Not on file  . Transportation needs:    Medical: Not on file    Non-medical: Not on file  Tobacco Use  . Smoking status: Former Smoker    Packs/day: 0.25    Years: 51.00    Pack years: 12.75    Types: Cigarettes    Last attempt to quit: 03/12/2015    Years since quitting: 3.7  . Smokeless tobacco: Never Used  Substance and Sexual Activity  . Alcohol use: Yes    Alcohol/week: 0.0 standard drinks    Comment: 1 glass of wine monthly  . Drug use: No  . Sexual activity: Not Currently  Lifestyle  . Physical activity:    Days per week: Not on file    Minutes per session: Not on file  . Stress: Not on file  Relationships  . Social connections:    Talks on phone: Not on file    Gets together: Not on file    Attends religious service: Not on file    Active member of club or organization: Not on file    Attends meetings of clubs or organizations: Not on file    Relationship status: Not on file  Other Topics Concern  . 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       Pets: Dog      Diet: Healthy diet, fruits, veggies and fish.    Exercise: None        Tobacco Counseling Counseling given: Not Answered    Activities of Daily Living In your present state of health, do you have any difficulty performing the following activities: 03/31/2018 02/15/2018  Hearing? N N  Vision? N N  Difficulty concentrating or making decisions? N N   Walking or climbing stairs? N Y  Dressing or bathing? N N  Some recent data might be hidden     Immunizations and Health Maintenance Immunization History  Administered Date(s) Administered  . Influenza Split 09/09/2011, 07/07/2012  . Influenza Whole 08/24/2007, 07/16/2008, 07/14/2010  . Influenza, High Dose Seasonal PF 07/28/2017, 07/24/2018  . Influenza,inj,Quad PF,6+ Mos 07/19/2013, 06/11/2014, 07/01/2015  . Pneumococcal Conjugate-13 02/07/2015  . Pneumococcal Polysaccharide-23 05/22/2010  . Td  10/11/1996   Health Maintenance Due  Topic Date Due  . DEXA SCAN  10/24/2005  . TETANUS/TDAP  10/11/2006    Patient Care Team: [name], NP as PCP - General (Family Medicine)  Indicate any recent Medical Services you may have received from other than [facility] providers in the past year (date may be approximate).     Assessment:   This is a routine wellness examination for [place]. Physical assessment deferred to PCP.   Hearing/Vision screen No exam data present  Dietary issues and exercise activities discussed:   Diet (meal preparation, eat out, water intake, caffeinated beverages, dairy products, fruits and vegetables): {Desc; diets:16563}      Goals   None    Depression Screen PHQ 2/9 Scores 05/24/2017 04/30/2016 02/07/2015  PHQ - 2 Score 0 0 0    Fall Risk Fall Risk  05/24/2017 04/30/2016 02/07/2015  Falls in the past year? No No No      Cognitive Function:        Screening Tests Health Maintenance  Topic Date Due  . DEXA SCAN  10/24/2005  . TETANUS/TDAP  10/11/2006  . INFLUENZA VACCINE  Completed  . PNA vac Low Risk Adult  Completed    Qualifies for Shingles Vaccine?       Plan:     I have personally reviewed and noted the following in the patient's chart:   . Medical and social history . Use of alcohol, tobacco or illicit drugs  . Current medications and supplements . Functional ability and status . Nutritional status . Physical activity  . Advanced directives . List of other physicians . Hospitalizations, surgeries, and ER visits in previous 12 months . Vitals . Screenings to include cognitive, depression, and falls . Referrals and appointments  In addition, I have reviewed and discussed with patient certain preventive protocols, quality metrics, and best practice recommendations. A written personalized care plan for preventive services as well as general preventive health recommendations were provided to patient.     Alphia Moh, RN   12/21/2018

## 2018-12-21 NOTE — Telephone Encounter (Signed)
Author phoned pt. to clarify that PCP had not ordered or intended to order any labs, and that awv does not include labwork, but offered to make a CPE for pt. with [facility]. Pt. stated she was hoping to have CPE within the next week. Author consulted PCP's CMA and offered pt. CPE on 3/19 at 0830; pt accepted and appointment made. Pt. Declined awv at this time.

## 2018-12-21 NOTE — Telephone Encounter (Signed)
She is not seeing me tomorrow and AWE does not include blood work.

## 2018-12-22 ENCOUNTER — Ambulatory Visit: Payer: Medicare Other

## 2018-12-26 ENCOUNTER — Other Ambulatory Visit: Payer: Self-pay | Admitting: Adult Health

## 2018-12-26 DIAGNOSIS — F329 Major depressive disorder, single episode, unspecified: Secondary | ICD-10-CM

## 2018-12-26 DIAGNOSIS — Z760 Encounter for issue of repeat prescription: Secondary | ICD-10-CM

## 2018-12-26 DIAGNOSIS — F32A Depression, unspecified: Secondary | ICD-10-CM

## 2018-12-26 MED ORDER — CITALOPRAM HYDROBROMIDE 40 MG PO TABS: 40.0000 mg | ORAL_TABLET | Freq: Every day | ORAL | 0 refills | Status: DC

## 2018-12-28 ENCOUNTER — Ambulatory Visit: Payer: Medicare Other | Admitting: Adult Health

## 2018-12-28 ENCOUNTER — Other Ambulatory Visit: Payer: Self-pay | Admitting: Hematology and Oncology

## 2018-12-28 DIAGNOSIS — C8338 Diffuse large B-cell lymphoma, lymph nodes of multiple sites: Secondary | ICD-10-CM

## 2018-12-28 MED ORDER — LENALIDOMIDE 10 MG PO CAPS: ORAL_CAPSULE | ORAL | 11 refills | Status: DC

## 2018-12-28 NOTE — Telephone Encounter (Signed)
Different dose

## 2019-01-09 ENCOUNTER — Other Ambulatory Visit: Payer: Self-pay

## 2019-01-09 DIAGNOSIS — C8338 Diffuse large B-cell lymphoma, lymph nodes of multiple sites: Secondary | ICD-10-CM

## 2019-01-09 NOTE — Telephone Encounter (Signed)
No entry 

## 2019-01-11 ENCOUNTER — Telehealth: Payer: Self-pay

## 2019-01-11 ENCOUNTER — Other Ambulatory Visit: Payer: Self-pay

## 2019-01-11 DIAGNOSIS — C8338 Diffuse large B-cell lymphoma, lymph nodes of multiple sites: Secondary | ICD-10-CM

## 2019-01-11 MED ORDER — LENALIDOMIDE 10 MG PO CAPS: ORAL_CAPSULE | ORAL | 11 refills | Status: DC

## 2019-01-11 NOTE — Telephone Encounter (Signed)
Revlimid Rx faxed to pharmacy, unable to escibe Rx.  Called and spoke with Mrs. Riepe and her husband. Told them for the next delivery of Revlimid she will get enough tabs for 2 cycles. They verbalized understanding.

## 2019-01-24 ENCOUNTER — Encounter: Payer: Self-pay | Admitting: Hematology and Oncology

## 2019-01-25 ENCOUNTER — Telehealth: Payer: Self-pay

## 2019-01-25 ENCOUNTER — Other Ambulatory Visit: Payer: Self-pay

## 2019-01-25 ENCOUNTER — Inpatient Hospital Stay (HOSPITAL_BASED_OUTPATIENT_CLINIC_OR_DEPARTMENT_OTHER): Payer: Medicare Other | Admitting: Hematology and Oncology

## 2019-01-25 ENCOUNTER — Inpatient Hospital Stay: Payer: Medicare Other | Attending: Hematology and Oncology

## 2019-01-25 ENCOUNTER — Encounter: Payer: Self-pay | Admitting: Hematology and Oncology

## 2019-01-25 ENCOUNTER — Telehealth: Payer: Self-pay | Admitting: Hematology and Oncology

## 2019-01-25 ENCOUNTER — Inpatient Hospital Stay: Payer: Medicare Other

## 2019-01-25 DIAGNOSIS — D701 Agranulocytosis secondary to cancer chemotherapy: Secondary | ICD-10-CM | POA: Insufficient documentation

## 2019-01-25 DIAGNOSIS — C8588 Other specified types of non-Hodgkin lymphoma, lymph nodes of multiple sites: Secondary | ICD-10-CM

## 2019-01-25 DIAGNOSIS — T451X5A Adverse effect of antineoplastic and immunosuppressive drugs, initial encounter: Secondary | ICD-10-CM

## 2019-01-25 DIAGNOSIS — C8338 Diffuse large B-cell lymphoma, lymph nodes of multiple sites: Secondary | ICD-10-CM

## 2019-01-25 DIAGNOSIS — K1231 Oral mucositis (ulcerative) due to antineoplastic therapy: Secondary | ICD-10-CM | POA: Diagnosis not present

## 2019-01-25 DIAGNOSIS — Z7189 Other specified counseling: Secondary | ICD-10-CM

## 2019-01-25 LAB — CBC WITH DIFFERENTIAL/PLATELET
Abs Immature Granulocytes: 0.01 10*3/uL (ref 0.00–0.07)
Basophils Absolute: 0 10*3/uL (ref 0.0–0.1)
Basophils Relative: 1 %
Eosinophils Absolute: 0.4 10*3/uL (ref 0.0–0.5)
Eosinophils Relative: 11 %
HCT: 37.7 % (ref 36.0–46.0)
Hemoglobin: 12.4 g/dL (ref 12.0–15.0)
Immature Granulocytes: 0 %
Lymphocytes Relative: 22 %
Lymphs Abs: 0.8 10*3/uL (ref 0.7–4.0)
MCH: 34.1 pg — ABNORMAL HIGH (ref 26.0–34.0)
MCHC: 32.9 g/dL (ref 30.0–36.0)
MCV: 103.6 fL — ABNORMAL HIGH (ref 80.0–100.0)
Monocytes Absolute: 0.8 10*3/uL (ref 0.1–1.0)
Monocytes Relative: 20 %
Neutro Abs: 1.8 10*3/uL (ref 1.7–7.7)
Neutrophils Relative %: 46 %
Platelets: 205 10*3/uL (ref 150–400)
RBC: 3.64 MIL/uL — ABNORMAL LOW (ref 3.87–5.11)
RDW: 13.7 % (ref 11.5–15.5)
WBC: 3.9 10*3/uL — ABNORMAL LOW (ref 4.0–10.5)
nRBC: 0 % (ref 0.0–0.2)

## 2019-01-25 LAB — COMPREHENSIVE METABOLIC PANEL
ALT: 10 U/L (ref 0–44)
AST: 16 U/L (ref 15–41)
Albumin: 3.6 g/dL (ref 3.5–5.0)
Alkaline Phosphatase: 111 U/L (ref 38–126)
Anion gap: 10 (ref 5–15)
BUN: 14 mg/dL (ref 8–23)
CO2: 28 mmol/L (ref 22–32)
Calcium: 8.5 mg/dL — ABNORMAL LOW (ref 8.9–10.3)
Chloride: 105 mmol/L (ref 98–111)
Creatinine, Ser: 0.79 mg/dL (ref 0.44–1.00)
GFR calc Af Amer: >60 mL/min (ref >60)
GFR calc non Af Amer: >60 mL/min (ref >60)
Glucose, Bld: 79 mg/dL (ref 70–99)
Potassium: 3.1 mmol/L — ABNORMAL LOW (ref 3.5–5.1)
Sodium: 143 mmol/L (ref 135–145)
Total Bilirubin: 0.6 mg/dL (ref 0.3–1.2)
Total Protein: 5.8 g/dL — ABNORMAL LOW (ref 6.5–8.1)

## 2019-01-25 LAB — URIC ACID: Uric Acid, Serum: 6 mg/dL (ref 2.5–7.1)

## 2019-01-25 NOTE — Assessment & Plan Note (Signed)
She tolerated treatment poorly due to significant mucositis and pancytopenia with treatment Currently, she is on modified dose of her treatment with Revlimid 10 mg 7 days on, 7 days off and to repeat every other week, 4 cycles every 28 days She will be due for repeat imaging study in June We discussed the risk and benefits of continuing maintenance therapy.  She is undecided She will talk to her husband again

## 2019-01-25 NOTE — Telephone Encounter (Signed)
Oral Oncology Patient Advocate Encounter  Revlimid grant with [employer] is out of funds. There are no other grants available at this time. I can assist the patient with applying for [employer] manufacturer assistance.   I called [employer] to see what they had for the patient currently and if a new application was needed. The last application was the renewal in Nov. 2019 so they do want a new application. I will get this application filled out and ask the patient to email me the income document and her signed portion of the application since this is how we completed it last time.  I called the patient and had to leave a voicemail.  This encounter will be updated until final determination.  [place] Patient [place] Phone [phone] Fax [phone] 01/25/2019   9:41 AM

## 2019-01-25 NOTE — Assessment & Plan Note (Signed)
Leukopenia is improved with recent dose modification.  She will continue neutropenic precaution

## 2019-01-25 NOTE — Assessment & Plan Note (Signed)
We have frequent goals of care discussion in the past The maintenance treatment is causing low blood count, predisposition to risk of infection, cost prohibitive as well as mucositis which is interfering with quality of life We discussed the risk and benefits of continuing treatment.  She is undecided.  I will continue similar discussion with her next month

## 2019-01-25 NOTE — Assessment & Plan Note (Signed)
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  I would like to avoid using Magic mouthwash on a regular basis

## 2019-01-25 NOTE — Telephone Encounter (Signed)
Oral Oncology Patient Advocate Encounter  Mr. Bienaime called me back. He stated that they asked about different treatment options since the Revlimid was causing sores in [place] mouth but that he wanted to go ahead with getting the application sent so it would be in if she were to stay on Revlimid.  He emailed me their 2020 SSI letters and I emailed him the portion of the application that [place] needs to sign.  I will work on completing the application tomorrow when I am in the office and get it faxed to [place].  This encounter will be updated until final determination.  [place] Patient [place] Phone [phone] Fax [phone] 01/25/2019   1:02 PM

## 2019-01-25 NOTE — Progress Notes (Signed)
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  I would like to avoid using Magic mouthwash on a regular basis  Leukopenia due to antineoplastic chemotherapy (HCC) Leukopenia is improved with recent dose modification.  She will continue neutropenic precaution  Goals of care, counseling/discussion We have frequent goals of care discussion in the past The maintenance treatment is causing low blood count, predisposition to risk of infection, cost prohibitive as well as mucositis which is interfering with quality of life We discussed the risk and benefits of continuing treatment.  She is undecided.  I will continue similar discussion with her next month   No orders of the defined types were placed in this encounter.   INTERVAL HISTORY: Please see below for problem oriented charting. 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 She was treated with chlorambucil in 2000.   --2007 - 09/2008.  Treated with single agent Rituxan.   --12/2008.  She had progressive adenopathy in chest and abdomen and repeat biopsy showed low grade follicular and diffuse B cell NHL.   --01/2009 - 07/2009. She required left ureteral stent and was treated with Treanda/Rituxan x 5 cycles from 02-2009 thru 06-2009.     09/16/2014 Imaging    No lymphadenopathy in the chest, abdomen, or pelvis. The small right axillary lymph nodes seen on the previous exam are unchanged in the 18 month interval. Small to moderate hiatal hernia.    11/20/2017 Imaging    Mildly displaced and angulated pathologic fracture of the proximal humeral shaft. 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     11/22/2017 PET scan    New hypermetabolic lymphadenopathy within the chest and abdomen, consistent with recurrent lymphoma. New large hypermetabolic mass in the right hepatic lobe, consistent with lymphoma this involvement. New small hypermetabolic mass in the left kidney, which may be due to lymphomatous involvement although primary renal cell carcinoma cannot be excluded. Hypermetabolic mass in the right humerus with pathologic fracture, consistent with lymphomatous involvement. Small hypermetabolic foci in right posterior chest wall, also suspicious for recurrent lymphoma.  11/23/2017 Procedure    Bone, fragment(s), Right Humeral Shaft Reamings - ATYPICAL LYMPHOID INFILTRATE CONSISTENT WITH NON-HODGKIN'S B CELL LYMPHOMA. - SEE ONCOLOGY TABLE. Microscopic  Comment LYMPHOMA Histologic type: Non-Hodgkins lymphoma, favor large cell type. Grade (if applicable): Favor high grade. Flow cytometry: No specimen is available for analysis. Immunohistochemical stains: BCL-2, BCL-6, CD3, CD5, CD10, CD15, CD20, CD30, CD34, CD43, LCA, CD79a, CD138, Ki-67, PAX-5 and TdT with appropriate controls as performed on block 1C. Touch preps/imprints: Not performed. Comments: The sections show multiple, primarily soft tissue fragments displaying a dense lymphoid infiltrate characterized by a mixture of small round to slightly irregular lymphocytes, histiocytes, and large centroblastic appearing lymphoid cells with partially clumped to vesicular chromatin, small nucleoli and amphophilic to clear cytoplasm. The number of admixed large lymphoid cells is variable but they appear relatively abundant in some areas associated with clustering. No material is available for flow cytometric analysis. Hence, a large batter of immunohistochemical stains were performed and show a mixture of T and B cells but with a significant B cell component mostly composed of large lymphoid cells with positivity for CD79a, PAX-5, CD30, BCL-2, and BCL-6. LCA shows diffuse staining although some of the large lymphoid cells appear negative. No significant staining is seen with CD20, CD10, CD15, CD34, TdT, or CD138. The lack of CD20 expression in this material is possibly related to prior treatment. There is variable increased expression of Ki-67 ranging from 10 to >50% in some areas particularly where there is abundance of large lymphoid cells. There is admixed abundant T cell population in the background as seen with CD3, CD5, and CD43 with on apparent co-expression of CD5 in B cell areas. The overall findings are consistent with involvement by non-Hodgkin's B cell lymphoma and the presence of relative abundance of large lymphoid B-cells and increased Ki-67 expression favor a high grade large B cell lymphoma.     11/23/2017 Surgery    1. CPT 24516-Intramedullary nailing of right humerus fracture 2. CPT 20245-Bone biopsy, deep      11/23/2017 - 11/25/2017 Hospital Admission    She was admitted to the hospital for management of humerus fracture and aggressive lymphoma    11/24/2017 Imaging    ECHO showed LV EF: 65% -   70%    11/25/2017 Procedure    Status post ultrasound-guided biopsy of liver mass. Tissue specimen sent to pathology for complete histopathologic analysis    12/07/2017 Imaging    Successful placement of a right internal jugular approach power injectable Port-A-Cath. The catheter is ready for immediate use.    12/09/2017 - 03/29/2018 Chemotherapy    The patient had R-CHOP chemo      12/17/2017 - 12/20/2017 Hospital Admission    She was hospitalized for influenza infection    01/03/2018 Procedure    Unsuccessful initial L4-5 LP. Successful L2-3 lumbar puncture with installation of methotrexate.    01/24/2018 Procedure    Non complicated injection of intrathecal methotrexate, as above.    02/09/2018 Imaging    LV EF: 55% -  60%    02/10/2018 PET scan    1. Interval response to therapy. There has been resolution of previous hypermetabolic tumor within the chest and abdomen. Deauville criteria 2 and 3. 2. Diffuse radiotracer uptake is identified within the axial and appendicular skeleton which is favored to represent treatment related changes. 3. Significant decrease in FDG uptake associated with previously noted hypermetabolic tumor involving the proximal humeral diaphysis. The FDG uptake within this area on today's study is equivalent  to that which is seen in the lumbar spine. 4. Aortic Atherosclerosis (ICD10-I70.0). Lad coronary artery calcifications. 5. Hiatal hernia    02/15/2018 Procedure    Fluoroscopic guided lumbar puncture and intrathecal injection of chemotherapy without complication.    03/31/2018 Procedure    Technically successful fluoroscopic guided lumbar puncture for  intrathecal methotrexate injection. Procedure was well tolerated without evidence for immediate complications. Patient will recover in short stay prior to being discharged home    05/08/2018 PET scan    No residual/persistent enlarged or hypermetabolic lymph nodes in the neck, chest, abdomen or pelvis. Findings suggest an excellent response to treatment.    05/25/2018 -  Chemotherapy    The patient had Revlimid for maintenance chemo     06/07/2018 Imaging    LV EF: 55% -  60%    09/25/2018 PET scan    1. Stable exam. No hypermetabolic lymphadenopathy in the neck, chest, abdomen, or pelvis. 2.  Aortic Atherosclerois (ICD10-170.0) 3.  Emphysema. ([id number])      REVIEW OF SYSTEMS:   Constitutional: Denies fevers, chills or abnormal weight loss Eyes: Denies blurriness of vision Respiratory: Denies cough, dyspnea or wheezes Cardiovascular: Denies palpitation, chest discomfort or lower extremity swelling Gastrointestinal:  Denies nausea, heartburn or change in bowel habits Skin: Denies abnormal skin rashes Lymphatics: Denies new lymphadenopathy or easy bruising Neurological:Denies numbness, tingling or new weaknesses Behavioral/Psych: Mood is stable, no new changes  All other systems were reviewed with the patient and are negative.  I have reviewed the past medical history, past surgical history, social history and family history with the patient and they are unchanged from previous note.  ALLERGIES:  is allergic to contrast media [iodinated diagnostic agents]; iohexol; povidone-iodine; penicillins; and adhesive [tape].  MEDICATIONS:  Current Outpatient Medications  Medication Sig Dispense Refill  . acetaminophen (TYLENOL) 325 MG tablet Take 2 tablets (650 mg total) by mouth every 6 (six) hours as needed for mild pain (or Fever >/= 101).    Marland Kitchen albuterol (PROVENTIL HFA;VENTOLIN HFA) 108 (90 Base) MCG/ACT inhaler Inhale 2 puffs into the lungs every 6 (six) hours as needed for  wheezing or shortness of breath. 1 Inhaler 0  . aspirin 81 MG tablet Take 81 mg by mouth daily.    Marland Kitchen BREO ELLIPTA 100-25 MCG/INH AEPB ONE INHALTION INTO LUNGS EVERY DAY 60 each 6  . cholecalciferol (VITAMIN D) 1000 units tablet Take 2,000 Units by mouth daily.    . citalopram (CELEXA) 40 MG tablet Take 1 tablet (40 mg total) by mouth daily. 90 tablet 0  . lenalidomide (REVLIMID) 10 MG capsule Take 1 capsule daily for 7 days and then take 7 days off. Repeat every 2 weeks for cycle of every 28 days 28 capsule 11  . magic mouthwash w/lidocaine SOLN Take 5 mLs by mouth 4 (four) times daily. 240 mL 0  . pantoprazole (PROTONIX) 40 MG tablet Take 1 tablet (40 mg total) by mouth daily. 180 tablet 3   No current facility-administered medications for this visit.     PHYSICAL EXAMINATION: ECOG PERFORMANCE STATUS: 1 - Symptomatic but completely ambulatory  Vitals:   01/25/19 0927  BP: (!) 112/43  Pulse: 75  Resp: 18  Temp: 98.1 F (36.7 C)  SpO2: 92%   Filed Weights   01/25/19 0927  Weight: 119 lb 4.8 oz (54.1 kg)    GENERAL:alert, no distress and comfortable SKIN: skin color, texture, turgor are normal, no rashes or significant lesions EYES: normal, Conjunctiva are pink and non-injected,  sclera clear OROPHARYNX:no exudate, no erythema and lips, buccal mucosa, and tongue normal  NECK: supple, thyroid normal size, non-tender, without nodularity LYMPH:  no palpable lymphadenopathy in the cervical, axillary or inguinal LUNGS: clear to auscultation and percussion with normal breathing effort HEART: regular rate & rhythm and no murmurs and no lower extremity edema ABDOMEN:abdomen soft, non-tender and normal bowel sounds Musculoskeletal:no cyanosis of digits and no clubbing  NEURO: alert & oriented x 3 with fluent speech, no focal motor/sensory deficits  LABORATORY DATA:  I have reviewed the data as listed    Component Value Date/Time   NA 143 01/25/2019 0905   NA 142 12/28/2016 1002   K  3.1 (L) 01/25/2019 0905   K 4.6 12/28/2016 1002   CL 105 01/25/2019 0905   CL 101 03/28/2013 0826   CO2 28 01/25/2019 0905   CO2 29 12/28/2016 1002   GLUCOSE 79 01/25/2019 0905   GLUCOSE 85 12/28/2016 1002   GLUCOSE 84 03/28/2013 0826   BUN 14 01/25/2019 0905   BUN 20.3 12/28/2016 1002   CREATININE 0.79 01/25/2019 0905   CREATININE 0.82 11/07/2018 1019   CREATININE 0.9 12/28/2016 1002   CALCIUM 8.5 (L) 01/25/2019 0905   CALCIUM 10.1 12/28/2016 1002   PROT 5.8 (L) 01/25/2019 0905   PROT 6.8 12/28/2016 1002   ALBUMIN 3.6 01/25/2019 0905   ALBUMIN 4.2 12/28/2016 1002   AST 16 01/25/2019 0905   AST 17 11/07/2018 1019   AST 20 12/28/2016 1002   ALT 10 01/25/2019 0905   ALT 14 11/07/2018 1019   ALT 12 12/28/2016 1002   ALKPHOS 111 01/25/2019 0905   ALKPHOS 111 12/28/2016 1002   BILITOT 0.6 01/25/2019 0905   BILITOT 0.5 11/07/2018 1019   BILITOT 0.55 12/28/2016 1002   GFRNONAA >60 01/25/2019 0905   GFRNONAA >60 11/07/2018 1019   GFRAA >60 01/25/2019 0905   GFRAA >60 11/07/2018 1019    No results found for: SPEP, UPEP  Lab Results  Component Value Date   WBC 3.9 (L) 01/25/2019   NEUTROABS 1.8 01/25/2019   HGB 12.4 01/25/2019   HCT 37.7 01/25/2019   MCV 103.6 (H) 01/25/2019   PLT 205 01/25/2019      Chemistry      Component Value Date/Time   NA 143 01/25/2019 0905   NA 142 12/28/2016 1002   K 3.1 (L) 01/25/2019 0905   K 4.6 12/28/2016 1002   CL 105 01/25/2019 0905   CL 101 03/28/2013 0826   CO2 28 01/25/2019 0905   CO2 29 12/28/2016 1002   BUN 14 01/25/2019 0905   BUN 20.3 12/28/2016 1002   CREATININE 0.79 01/25/2019 0905   CREATININE 0.82 11/07/2018 1019   CREATININE 0.9 12/28/2016 1002      Component Value Date/Time   CALCIUM 8.5 (L) 01/25/2019 0905   CALCIUM 10.1 12/28/2016 1002   ALKPHOS 111 01/25/2019 0905   ALKPHOS 111 12/28/2016 1002   AST 16 01/25/2019 0905   AST 17 11/07/2018 1019   AST 20 12/28/2016 1002   ALT 10 01/25/2019 0905   ALT 14  11/07/2018 1019   ALT 12 12/28/2016 1002   BILITOT 0.6 01/25/2019 0905   BILITOT 0.5 11/07/2018 1019   BILITOT 0.55 12/28/2016 1002      All questions were answered. The patient knows to call the clinic with any problems, questions or concerns. No barriers to learning was detected.  I spent 15 minutes counseling the patient face to face. The total time spent in  the appointment was 20 minutes and more than 50% was on counseling and review of test results  [name], MD 01/25/2019 10:04 AM

## 2019-01-25 NOTE — Telephone Encounter (Signed)
Scheduled one month f/u per sch msg. Mailed printout.

## 2019-01-26 NOTE — Telephone Encounter (Signed)
I have replied to her husband's email about options There are no other options except for surveillance

## 2019-01-26 NOTE — Telephone Encounter (Signed)
Oral Oncology Patient Advocate Encounter  I faxed the completed application today.  This encounter will be updated until final determination.  [place] Patient [place] Phone [phone] Fax [phone] 01/26/2019   3:16 PM

## 2019-01-31 NOTE — Telephone Encounter (Signed)
Oral Oncology Patient Advocate Encounter  I followed up with Brooke Washington, the [employer] representative today. Brooke Washington stated that she has requested the application so that she can process it and will get back to me with an update as soon as she can.  This encounter will be updated until final determination.  Brooke Washington Patient [place] Phone [phone] Fax [phone] 01/31/2019   10:05 AM

## 2019-02-01 NOTE — Telephone Encounter (Signed)
Oral Oncology Patient Advocate Encounter  Received notification from [place] Patient Assistance program that patient has been successfully enrolled into their program to receive Revlimid from the manufacturer at $0 out of pocket until 10/11/19.    I called and spoke with patient.  She knows we will have to re-apply.   Patient knows to call the office with questions or concerns.   Oral Oncology Clinic will continue to follow.  [place] Patient [place] Phone [phone] Fax [phone] 02/01/2019    10:00 AM

## 2019-02-05 ENCOUNTER — Other Ambulatory Visit: Payer: Self-pay

## 2019-02-05 DIAGNOSIS — C8338 Diffuse large B-cell lymphoma, lymph nodes of multiple sites: Secondary | ICD-10-CM

## 2019-02-05 MED ORDER — LENALIDOMIDE 10 MG PO CAPS: ORAL_CAPSULE | ORAL | 11 refills | Status: DC

## 2019-02-26 ENCOUNTER — Other Ambulatory Visit: Payer: Self-pay

## 2019-02-26 ENCOUNTER — Inpatient Hospital Stay (HOSPITAL_BASED_OUTPATIENT_CLINIC_OR_DEPARTMENT_OTHER): Payer: Medicare Other | Admitting: Hematology and Oncology

## 2019-02-26 ENCOUNTER — Inpatient Hospital Stay: Payer: Medicare Other

## 2019-02-26 ENCOUNTER — Inpatient Hospital Stay: Payer: Medicare Other | Attending: Hematology and Oncology

## 2019-02-26 VITALS — BP 125/81 | HR 61 | Temp 99.1°F | Resp 18 | Ht 64.0 in | Wt 119.8 lb

## 2019-02-26 DIAGNOSIS — Z79899 Other long term (current) drug therapy: Secondary | ICD-10-CM | POA: Diagnosis not present

## 2019-02-26 DIAGNOSIS — Z7982 Long term (current) use of aspirin: Secondary | ICD-10-CM | POA: Insufficient documentation

## 2019-02-26 DIAGNOSIS — K1231 Oral mucositis (ulcerative) due to antineoplastic therapy: Secondary | ICD-10-CM

## 2019-02-26 DIAGNOSIS — C8588 Other specified types of non-Hodgkin lymphoma, lymph nodes of multiple sites: Secondary | ICD-10-CM

## 2019-02-26 DIAGNOSIS — Z9221 Personal history of antineoplastic chemotherapy: Secondary | ICD-10-CM | POA: Diagnosis not present

## 2019-02-26 DIAGNOSIS — C8338 Diffuse large B-cell lymphoma, lymph nodes of multiple sites: Secondary | ICD-10-CM | POA: Insufficient documentation

## 2019-02-26 LAB — CBC WITH DIFFERENTIAL/PLATELET
Abs Immature Granulocytes: 0.01 10*3/uL (ref 0.00–0.07)
Basophils Absolute: 0.1 10*3/uL (ref 0.0–0.1)
Basophils Relative: 2 %
Eosinophils Absolute: 0.6 10*3/uL — ABNORMAL HIGH (ref 0.0–0.5)
Eosinophils Relative: 15 %
HCT: 38.3 % (ref 36.0–46.0)
Hemoglobin: 12.7 g/dL (ref 12.0–15.0)
Immature Granulocytes: 0 %
Lymphocytes Relative: 23 %
Lymphs Abs: 0.9 10*3/uL (ref 0.7–4.0)
MCH: 34.2 pg — ABNORMAL HIGH (ref 26.0–34.0)
MCHC: 33.2 g/dL (ref 30.0–36.0)
MCV: 103.2 fL — ABNORMAL HIGH (ref 80.0–100.0)
Monocytes Absolute: 0.5 10*3/uL (ref 0.1–1.0)
Monocytes Relative: 12 %
Neutro Abs: 1.9 10*3/uL (ref 1.7–7.7)
Neutrophils Relative %: 48 %
Platelets: 208 10*3/uL (ref 150–400)
RBC: 3.71 MIL/uL — ABNORMAL LOW (ref 3.87–5.11)
RDW: 13.5 % (ref 11.5–15.5)
WBC: 4 10*3/uL (ref 4.0–10.5)
nRBC: 0 % (ref 0.0–0.2)

## 2019-02-26 LAB — COMPREHENSIVE METABOLIC PANEL
ALT: 14 U/L (ref 0–44)
AST: 14 U/L — ABNORMAL LOW (ref 15–41)
Albumin: 3.6 g/dL (ref 3.5–5.0)
Alkaline Phosphatase: 119 U/L (ref 38–126)
Anion gap: 9 (ref 5–15)
BUN: 16 mg/dL (ref 8–23)
CO2: 28 mmol/L (ref 22–32)
Calcium: 8.6 mg/dL — ABNORMAL LOW (ref 8.9–10.3)
Chloride: 106 mmol/L (ref 98–111)
Creatinine, Ser: 0.75 mg/dL (ref 0.44–1.00)
GFR calc Af Amer: >60 mL/min (ref >60)
GFR calc non Af Amer: >60 mL/min (ref >60)
Glucose, Bld: 84 mg/dL (ref 70–99)
Potassium: 3.3 mmol/L — ABNORMAL LOW (ref 3.5–5.1)
Sodium: 143 mmol/L (ref 135–145)
Total Bilirubin: 0.5 mg/dL (ref 0.3–1.2)
Total Protein: 5.8 g/dL — ABNORMAL LOW (ref 6.5–8.1)

## 2019-02-26 MED ORDER — SODIUM CHLORIDE 0.9% FLUSH
10.0000 mL | Freq: Once | INTRAVENOUS | Status: AC
  Administered 2019-02-26: 10 mL
  Filled 2019-02-26: qty 10

## 2019-02-26 MED ORDER — HEPARIN SOD (PORK) LOCK FLUSH 100 UNIT/ML IV SOLN
500.0000 [IU] | Freq: Once | INTRAVENOUS | Status: AC
  Administered 2019-02-26: 09:00:00 500 [IU]
  Filled 2019-02-26: qty 5

## 2019-02-26 NOTE — Progress Notes (Signed)
Been a while since patient got a flush. Hard to get blood return at first. Did get blood return eventually and collected labs.

## 2019-02-27 ENCOUNTER — Encounter: Payer: Self-pay | Admitting: Hematology and Oncology

## 2019-02-27 NOTE — Assessment & Plan Note (Signed)
She has no clinical signs of lymphoma recurrence She will continue her current treatment with lenalidomide I plan to repeat imaging study in June for further follow-up.

## 2019-02-27 NOTE — Assessment & Plan Note (Signed)
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

## 2019-02-27 NOTE — Progress Notes (Signed)
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  Mucositis due to antineoplastic therapy She has recurrent mucositis due to side effects of treatment Careful examination of her oral cavity does not disclose any evidence of blister, sores or thrush We recommend conservative management with salt water gargle only or Biotene as needed.    Orders Placed This Encounter  Procedures  . CT ABDOMEN PELVIS W CONTRAST    Standing Status:   Future    Standing Expiration Date:   02/26/2020    Order Specific Question:   If indicated for the ordered procedure, I authorize the administration of contrast media per Radiology protocol    Answer:   Yes    Order Specific Question:   Preferred imaging location?    Answer:   [facility]    Order Specific Question:   Radiology Contrast Protocol - do NOT remove file path    Answer:   _0 [email or web address]  . CT CHEST W CONTRAST    Standing Status:   Future    Standing Expiration Date:   02/26/2020    Order Specific Question:   If indicated for the ordered procedure, I authorize the administration of contrast media per Radiology protocol    Answer:   Yes    Order Specific Question:   Preferred imaging location?    Answer:   [facility]    Order Specific Question:   Radiology Contrast Protocol - do NOT remove file path    Answer:   _1 [email or web address]    INTERVAL HISTORY: Please see below for problem oriented charting. She returns for further follow-up No recent infection, fever or chills No new lymphadenopathy She continues to have intermittent mucositis but is not debilitating She  remains active No bone pain. 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 She was treated with chlorambucil in 2000.   --2007 - 09/2008.  Treated with single agent Rituxan.   --12/2008.  She had progressive adenopathy in chest and abdomen and repeat biopsy showed low grade follicular and diffuse B cell NHL.   --01/2009 - 07/2009. She required left ureteral stent and was treated with Treanda/Rituxan x 5 cycles from 02-2009 thru 06-2009.     09/16/2014 Imaging    No lymphadenopathy in the chest, abdomen, or pelvis. The small right axillary lymph nodes seen on the previous exam are unchanged in the 18 month interval. Small to moderate hiatal hernia.    11/20/2017 Imaging    Mildly displaced and angulated pathologic fracture of the proximal humeral shaft. 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     11/22/2017 PET scan    New hypermetabolic lymphadenopathy within the chest and abdomen, consistent with recurrent lymphoma. New large hypermetabolic mass in the right hepatic lobe, consistent with lymphoma this involvement. New small hypermetabolic mass in the left kidney, which may be due to lymphomatous involvement  although primary renal cell carcinoma cannot be excluded. Hypermetabolic mass in the right humerus with pathologic fracture, consistent with lymphomatous involvement. Small hypermetabolic foci in right posterior chest wall, also suspicious for recurrent lymphoma.    11/23/2017 Procedure    Bone, fragment(s), Right Humeral Shaft Reamings - ATYPICAL LYMPHOID INFILTRATE CONSISTENT WITH NON-HODGKIN'S B CELL LYMPHOMA. - SEE ONCOLOGY  TABLE. Microscopic Comment LYMPHOMA Histologic type: Non-Hodgkins lymphoma, favor large cell type. Grade (if applicable): Favor high grade. Flow cytometry: No specimen is available for analysis. Immunohistochemical stains: BCL-2, BCL-6, CD3, CD5, CD10, CD15, CD20, CD30, CD34, CD43, LCA, CD79a, CD138, Ki-67, PAX-5 and TdT with appropriate controls as performed on block 1C. Touch preps/imprints: Not performed. Comments: The sections show multiple, primarily soft tissue fragments displaying a dense lymphoid infiltrate characterized by a mixture of small round to slightly irregular lymphocytes, histiocytes, and large centroblastic appearing lymphoid cells with partially clumped to vesicular chromatin, small nucleoli and amphophilic to clear cytoplasm. The number of admixed large lymphoid cells is variable but they appear relatively abundant in some areas associated with clustering. No material is available for flow cytometric analysis. Hence, a large batter of immunohistochemical stains were performed and show a mixture of T and B cells but with a significant B cell component mostly composed of large lymphoid cells with positivity for CD79a, PAX-5, CD30, BCL-2, and BCL-6. LCA shows diffuse staining although some of the large lymphoid cells appear negative. No significant staining is seen with CD20, CD10, CD15, CD34, TdT, or CD138. The lack of CD20 expression in this material is possibly related to prior treatment. There is variable increased expression of Ki-67 ranging from 10 to >50% in some areas particularly where there is abundance of large lymphoid cells. There is admixed abundant T cell population in the background as seen with CD3, CD5, and CD43 with on apparent co-expression of CD5 in B cell areas. The overall findings are consistent with involvement by non-Hodgkin's B cell lymphoma and the presence of relative abundance of large lymphoid B-cells and increased Ki-67 expression favor a high grade large  B cell lymphoma.    11/23/2017 Surgery    1. CPT 24516-Intramedullary nailing of right humerus fracture 2. CPT 20245-Bone biopsy, deep      11/23/2017 - 11/25/2017 Hospital Admission    She was admitted to the hospital for management of humerus fracture and aggressive lymphoma    11/24/2017 Imaging    ECHO showed LV EF: 65% -   70%    11/25/2017 Procedure    Status post ultrasound-guided biopsy of liver mass. Tissue specimen sent to pathology for complete histopathologic analysis    12/07/2017 Imaging    Successful placement of a right internal jugular approach power injectable Port-A-Cath. The catheter is ready for immediate use.    12/09/2017 - 03/29/2018 Chemotherapy    The patient had R-CHOP chemo      12/17/2017 - 12/20/2017 Hospital Admission    She was hospitalized for influenza infection    01/03/2018 Procedure    Unsuccessful initial L4-5 LP. Successful L2-3 lumbar puncture with installation of methotrexate.    01/24/2018 Procedure    Non complicated injection of intrathecal methotrexate, as above.    02/09/2018 Imaging    LV EF: 55% -  60%    02/10/2018 PET scan    1. Interval response to therapy. There has been resolution of previous hypermetabolic tumor within the chest and abdomen. Deauville criteria 2 and 3. 2. Diffuse radiotracer uptake is identified within the axial and appendicular  skeleton which is favored to represent treatment related changes. 3. Significant decrease in FDG uptake associated with previously noted hypermetabolic tumor involving the proximal humeral diaphysis. The FDG uptake within this area on today's study is equivalent to that which is seen in the lumbar spine. 4. Aortic Atherosclerosis (ICD10-I70.0). Lad coronary artery calcifications. 5. Hiatal hernia    02/15/2018 Procedure    Fluoroscopic guided lumbar puncture and intrathecal injection of chemotherapy without complication.    03/31/2018 Procedure    Technically successful fluoroscopic guided  lumbar puncture for intrathecal methotrexate injection. Procedure was well tolerated without evidence for immediate complications. Patient will recover in short stay prior to being discharged home    05/08/2018 PET scan    No residual/persistent enlarged or hypermetabolic lymph nodes in the neck, chest, abdomen or pelvis. Findings suggest an excellent response to treatment.    05/25/2018 -  Chemotherapy    The patient had Revlimid for maintenance chemo     06/07/2018 Imaging    LV EF: 55% -  60%    09/25/2018 PET scan    1. Stable exam. No hypermetabolic lymphadenopathy in the neck, chest, abdomen, or pelvis. 2.  Aortic Atherosclerois (ICD10-170.0) 3.  Emphysema. ([id number]-Y56.9)      REVIEW OF SYSTEMS:   Constitutional: Denies fevers, chills or abnormal weight loss Eyes: Denies blurriness of vision Ears, nose, mouth, throat, and face: Denies mucositis or sore throat Respiratory: Denies cough, dyspnea or wheezes Cardiovascular: Denies palpitation, chest discomfort or lower extremity swelling Gastrointestinal:  Denies nausea, heartburn or change in bowel habits Skin: Denies abnormal skin rashes Lymphatics: Denies new lymphadenopathy or easy bruising Neurological:Denies numbness, tingling or new weaknesses Behavioral/Psych: Mood is stable, no new changes  All other systems were reviewed with the patient and are negative.  I have reviewed the past medical history, past surgical history, social history and family history with the patient and they are unchanged from previous note.  ALLERGIES:  is allergic to contrast media [iodinated diagnostic agents]; iohexol; povidone-iodine; penicillins; and adhesive [tape].  MEDICATIONS:  Current Outpatient Medications  Medication Sig Dispense Refill  . acetaminophen (TYLENOL) 325 MG tablet Take 2 tablets (650 mg total) by mouth every 6 (six) hours as needed for mild pain (or Fever >/= 101).    Marland Kitchen albuterol (PROVENTIL HFA;VENTOLIN HFA) 108 (90  Base) MCG/ACT inhaler Inhale 2 puffs into the lungs every 6 (six) hours as needed for wheezing or shortness of breath. 1 Inhaler 0  . aspirin 81 MG tablet Take 81 mg by mouth daily.    Marland Kitchen BREO ELLIPTA 100-25 MCG/INH AEPB ONE INHALTION INTO LUNGS EVERY DAY 60 each 6  . cholecalciferol (VITAMIN D) 1000 units tablet Take 2,000 Units by mouth daily.    . citalopram (CELEXA) 40 MG tablet Take 1 tablet (40 mg total) by mouth daily. 90 tablet 0  . lenalidomide (REVLIMID) 10 MG capsule Take 1 capsule daily for 7 days and then take 7 days off. Repeat every 2 weeks for cycle of every 28 days 14 capsule 11  . magic mouthwash w/lidocaine SOLN Take 5 mLs by mouth 4 (four) times daily. 240 mL 0  . pantoprazole (PROTONIX) 40 MG tablet Take 1 tablet (40 mg total) by mouth daily. 180 tablet 3   No current facility-administered medications for this visit.     PHYSICAL EXAMINATION: ECOG PERFORMANCE STATUS: 1 - Symptomatic but completely ambulatory  Vitals:   02/26/19 0918  BP: 125/81  Pulse: 61  Resp: 18  Temp: 99.1 F (37.3  C)  SpO2: 91%   Filed Weights   02/26/19 0918  Weight: 119 lb 12.8 oz (54.3 kg)    GENERAL:alert, no distress and comfortable SKIN: skin color, texture, turgor are normal, no rashes or significant lesions EYES: normal, Conjunctiva are pink and non-injected, sclera clear OROPHARYNX:no exudate, no erythema and lips, buccal mucosa, and tongue normal  NECK: supple, thyroid normal size, non-tender, without nodularity LYMPH:  no palpable lymphadenopathy in the cervical, axillary or inguinal LUNGS: clear to auscultation and percussion with normal breathing effort HEART: regular rate & rhythm and no murmurs and no lower extremity edema ABDOMEN:abdomen soft, non-tender and normal bowel sounds Musculoskeletal:no cyanosis of digits and no clubbing  NEURO: alert & oriented x 3 with fluent speech, no focal motor/sensory deficits  LABORATORY DATA:  I have reviewed the data as listed     Component Value Date/Time   NA 143 02/26/2019 0857   NA 142 12/28/2016 1002   K 3.3 (L) 02/26/2019 0857   K 4.6 12/28/2016 1002   CL 106 02/26/2019 0857   CL 101 03/28/2013 0826   CO2 28 02/26/2019 0857   CO2 29 12/28/2016 1002   GLUCOSE 84 02/26/2019 0857   GLUCOSE 85 12/28/2016 1002   GLUCOSE 84 03/28/2013 0826   BUN 16 02/26/2019 0857   BUN 20.3 12/28/2016 1002   CREATININE 0.75 02/26/2019 0857   CREATININE 0.82 11/07/2018 1019   CREATININE 0.9 12/28/2016 1002   CALCIUM 8.6 (L) 02/26/2019 0857   CALCIUM 10.1 12/28/2016 1002   PROT 5.8 (L) 02/26/2019 0857   PROT 6.8 12/28/2016 1002   ALBUMIN 3.6 02/26/2019 0857   ALBUMIN 4.2 12/28/2016 1002   AST 14 (L) 02/26/2019 0857   AST 17 11/07/2018 1019   AST 20 12/28/2016 1002   ALT 14 02/26/2019 0857   ALT 14 11/07/2018 1019   ALT 12 12/28/2016 1002   ALKPHOS 119 02/26/2019 0857   ALKPHOS 111 12/28/2016 1002   BILITOT 0.5 02/26/2019 0857   BILITOT 0.5 11/07/2018 1019   BILITOT 0.55 12/28/2016 1002   GFRNONAA >60 02/26/2019 0857   GFRNONAA >60 11/07/2018 1019   GFRAA >60 02/26/2019 0857   GFRAA >60 11/07/2018 1019    No results found for: SPEP, UPEP  Lab Results  Component Value Date   WBC 4.0 02/26/2019   NEUTROABS 1.9 02/26/2019   HGB 12.7 02/26/2019   HCT 38.3 02/26/2019   MCV 103.2 (H) 02/26/2019   PLT 208 02/26/2019      Chemistry      Component Value Date/Time   NA 143 02/26/2019 0857   NA 142 12/28/2016 1002   K 3.3 (L) 02/26/2019 0857   K 4.6 12/28/2016 1002   CL 106 02/26/2019 0857   CL 101 03/28/2013 0826   CO2 28 02/26/2019 0857   CO2 29 12/28/2016 1002   BUN 16 02/26/2019 0857   BUN 20.3 12/28/2016 1002   CREATININE 0.75 02/26/2019 0857   CREATININE 0.82 11/07/2018 1019   CREATININE 0.9 12/28/2016 1002      Component Value Date/Time   CALCIUM 8.6 (L) 02/26/2019 0857   CALCIUM 10.1 12/28/2016 1002   ALKPHOS 119 02/26/2019 0857   ALKPHOS 111 12/28/2016 1002   AST 14 (L) 02/26/2019 0857    AST 17 11/07/2018 1019   AST 20 12/28/2016 1002   ALT 14 02/26/2019 0857   ALT 14 11/07/2018 1019   ALT 12 12/28/2016 1002   BILITOT 0.5 02/26/2019 0857   BILITOT 0.5 11/07/2018 1019  BILITOT 0.55 12/28/2016 1002       All questions were answered. The patient knows to call the clinic with any problems, questions or concerns. No barriers to learning was detected.  I spent 15 minutes counseling the patient face to face. The total time spent in the appointment was 20 minutes and more than 50% was on counseling and review of test results  [name], MD 02/27/2019 10:32 AM

## 2019-02-28 ENCOUNTER — Telehealth: Payer: Self-pay | Admitting: Hematology and Oncology

## 2019-02-28 NOTE — Telephone Encounter (Signed)
Called regarding schedule °

## 2019-03-02 IMAGING — DX DG CHEST 2V
2 series · 2 of 2 positions shown · non-contrast
Comparison: 12/24/2017

CLINICAL DATA: Productive cough for 15 days.  COPD.

EXAM:
CHEST - 2 VIEW

[chest pa]
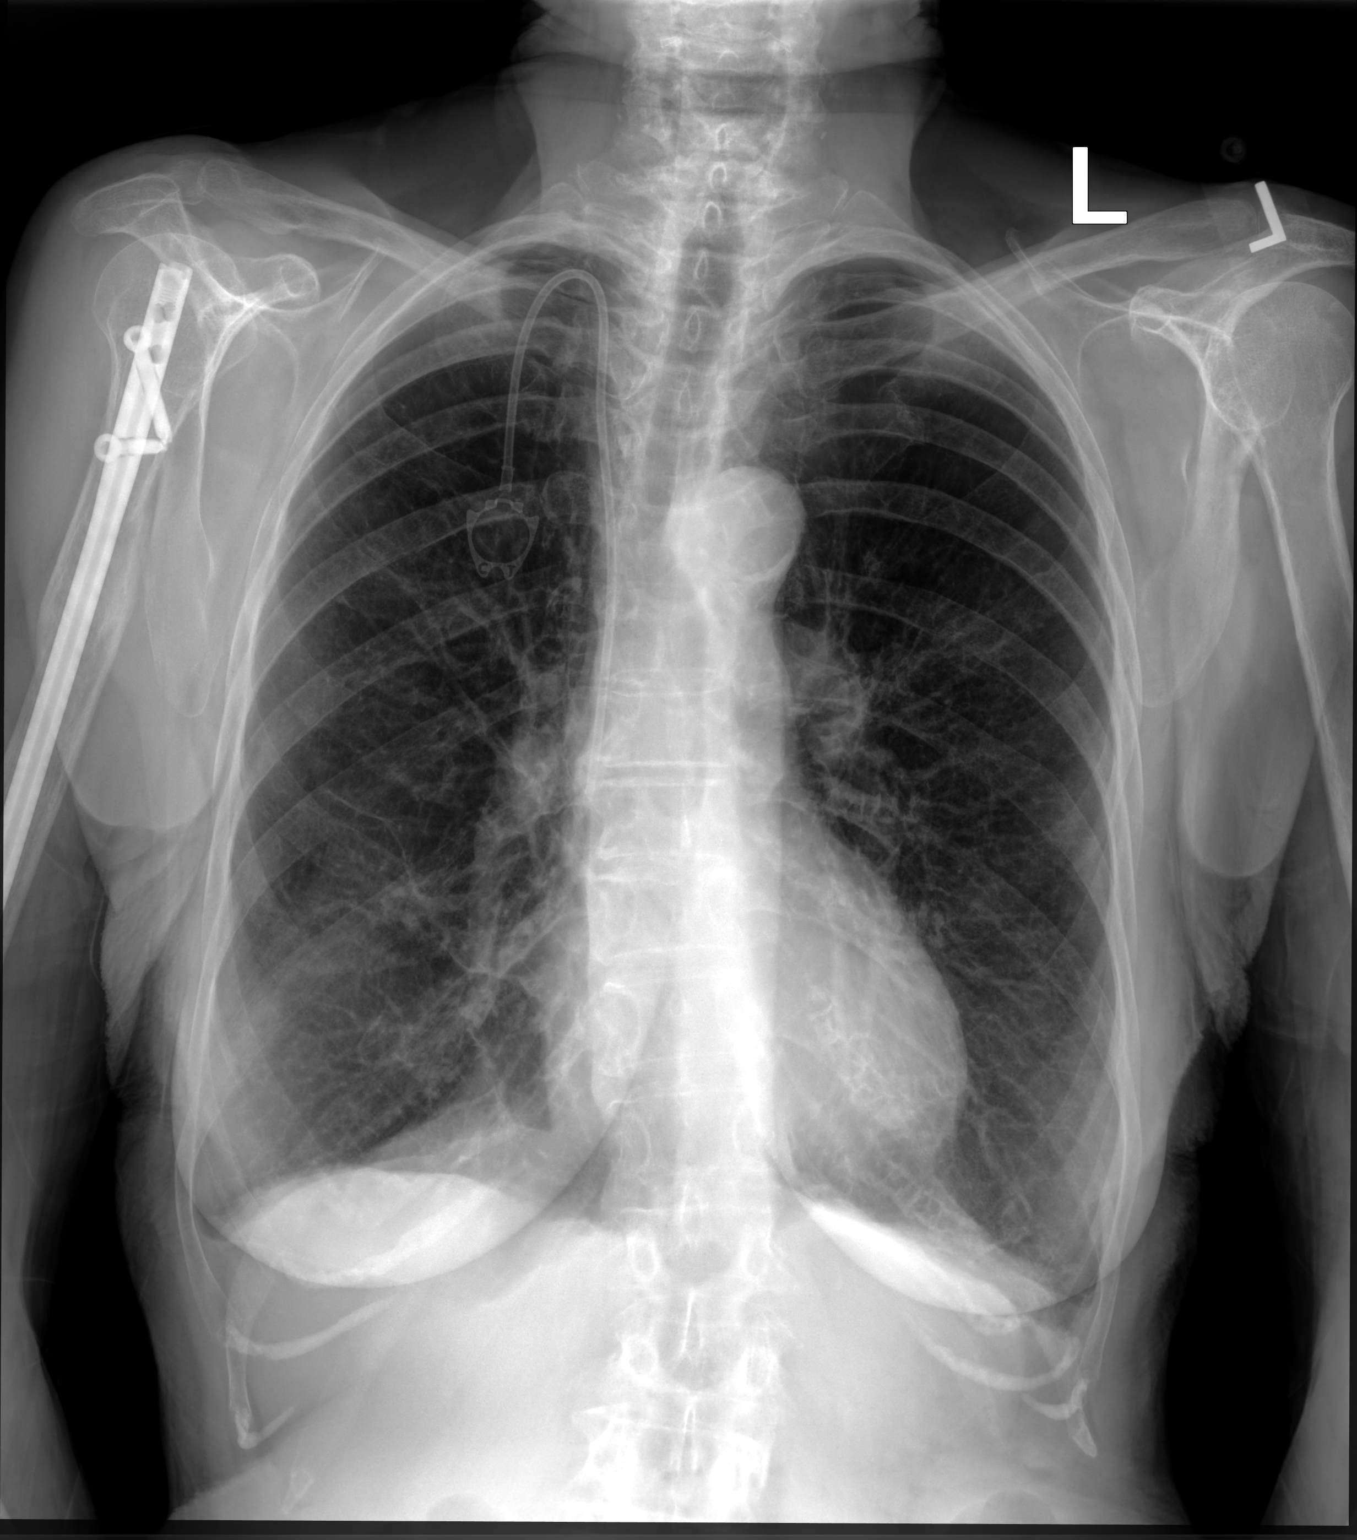

[chest lat]
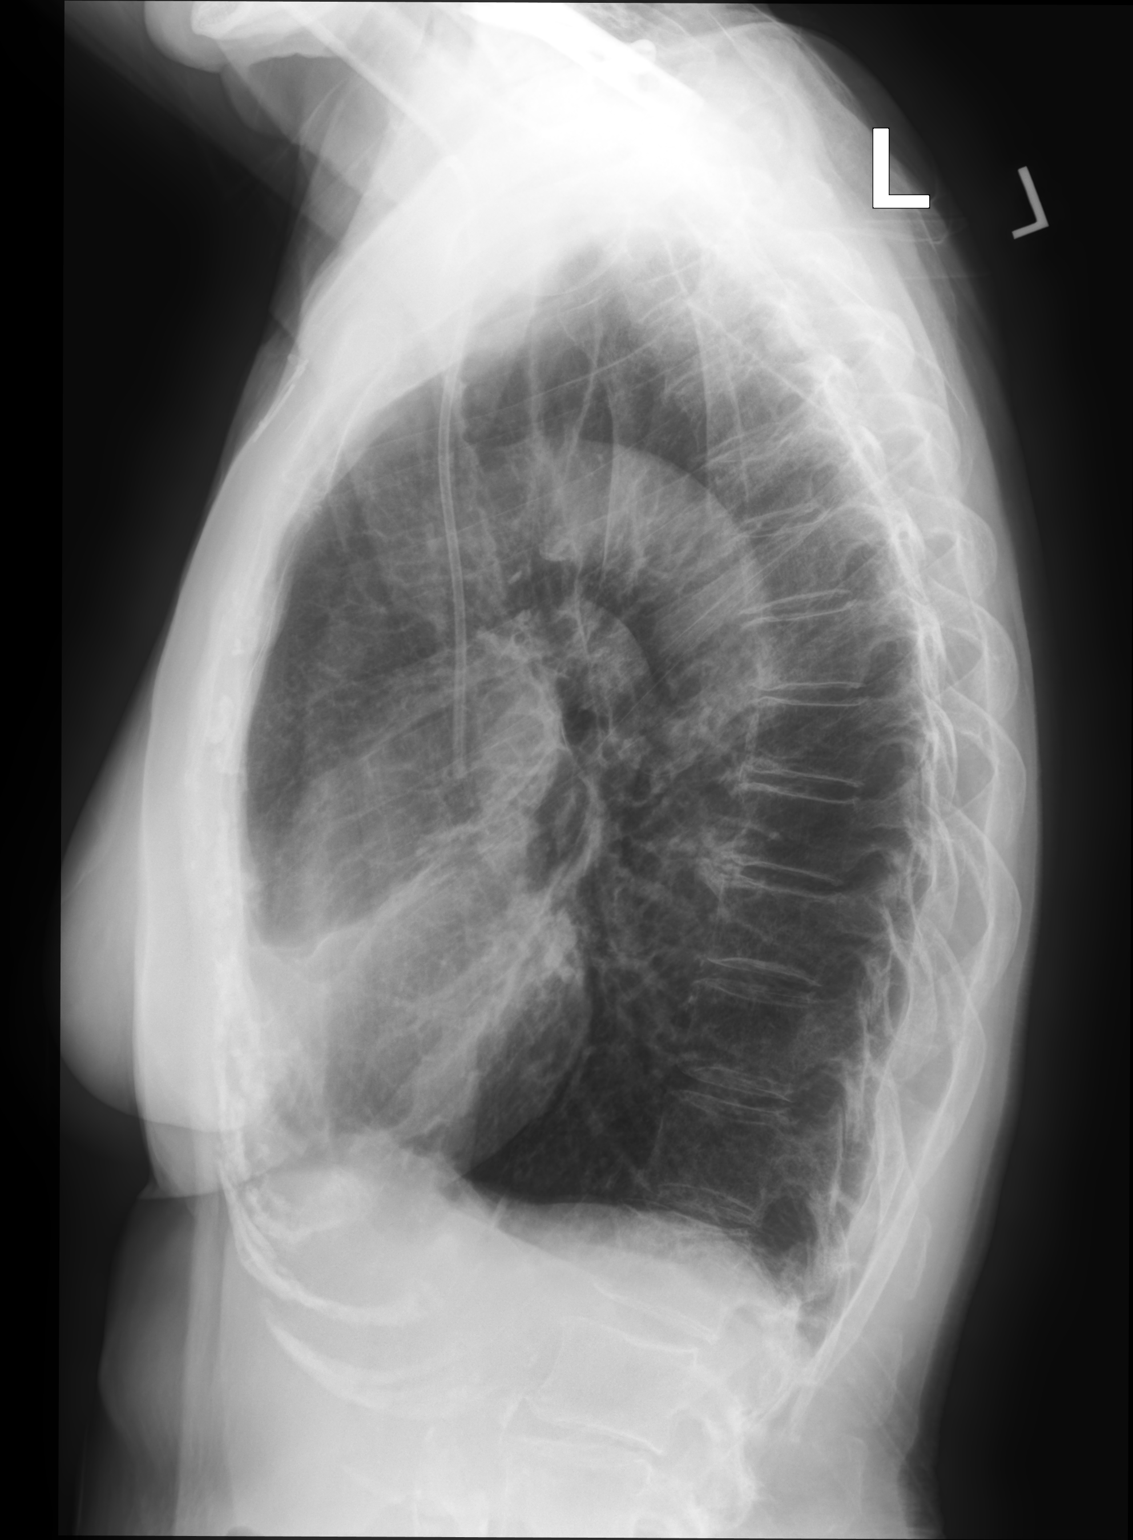

[2 of 2 positions shown; findings below may reference images not displayed]

FINDINGS: The heart size and mediastinal contours are within normal limits.
Aortic atherosclerosis. Right-sided power port remains in
appropriate position.

Marked pulmonary hyperinflation is seen, consistent with COPD. Mild
scarring noted in the right middle lobe. No evidence of pulmonary
infiltrate or edema. No evidence of pleural effusion. Intramedullary
rod again noted in the right humerus.
IMPRESSION: COPD.  No active disease.

## 2019-03-13 ENCOUNTER — Telehealth: Payer: Self-pay

## 2019-03-13 DIAGNOSIS — C8338 Diffuse large B-cell lymphoma, lymph nodes of multiple sites: Secondary | ICD-10-CM

## 2019-03-13 MED ORDER — LENALIDOMIDE 10 MG PO CAPS: ORAL_CAPSULE | ORAL | 11 refills | Status: DC

## 2019-03-13 NOTE — Telephone Encounter (Signed)
[employer] auth obtained. Revlimed prescription sent to [facility]

## 2019-03-26 ENCOUNTER — Encounter: Payer: Self-pay | Admitting: Pharmacist

## 2019-03-26 ENCOUNTER — Other Ambulatory Visit: Payer: Self-pay | Admitting: Adult Health

## 2019-03-26 DIAGNOSIS — F329 Major depressive disorder, single episode, unspecified: Secondary | ICD-10-CM

## 2019-03-26 DIAGNOSIS — Z760 Encounter for issue of repeat prescription: Secondary | ICD-10-CM

## 2019-03-26 DIAGNOSIS — F32A Depression, unspecified: Secondary | ICD-10-CM

## 2019-03-26 NOTE — Progress Notes (Signed)
Oral Oncology Pharmacist Encounter  Received email from patient's husband, Carlee Vonderhaar, this weekend with a copy of a letter received from the [employer] stating the grant would be closed on 04/19/2019 if there was no activity placed on the grant. He was asking for further clarification of this letter. Email return to Mr. Bendix stating:  "Per chart notes left by Margy Clarks, Oral Oncology Patient Advocate on 02/01/2019, Mrs. Pickron has been enrolled to receive her Revlimid medication from the manufacturer, [place], 02/01/2019 - 10/11/2019.  We had received notification through MyChart message on 01/24/2019 that the [employer] grant was out of funds, so manufacturer assistance application with [employer] was completed.  [employer] is shipping Mrs. Marriott's Revlimid from the patient assistance pharmacy, [facility] [employer], at [employer] out of pocket cost to you all.  The grant from [place] is not being used, and can not be used, as it is not needed while Mrs. Terris is receiving her medication from the manufacturer."  They know to contact the office with any additional questions or concerns.  Johny Drilling, PharmD, BCPS, BCOP  03/26/2019 8:34 AM Oral Oncology Clinic [phone]

## 2019-04-05 ENCOUNTER — Encounter: Payer: Self-pay | Admitting: Hematology and Oncology

## 2019-04-09 ENCOUNTER — Encounter (HOSPITAL_COMMUNITY): Payer: Self-pay

## 2019-04-09 ENCOUNTER — Inpatient Hospital Stay: Payer: Medicare Other

## 2019-04-09 ENCOUNTER — Encounter (HOSPITAL_COMMUNITY): Payer: Self-pay | Admitting: Emergency Medicine

## 2019-04-09 ENCOUNTER — Inpatient Hospital Stay: Payer: Medicare Other | Attending: Hematology and Oncology

## 2019-04-09 ENCOUNTER — Other Ambulatory Visit: Payer: Self-pay | Admitting: Hematology and Oncology

## 2019-04-09 ENCOUNTER — Ambulatory Visit (HOSPITAL_COMMUNITY)
Admission: RE | Admit: 2019-04-09 | Discharge: 2019-04-09 | Disposition: A | Discharge status: Home / Self Care | Payer: Medicare Other | Source: Ambulatory Visit | Attending: Hematology and Oncology | Admitting: Hematology and Oncology

## 2019-04-09 ENCOUNTER — Other Ambulatory Visit: Payer: Self-pay

## 2019-04-09 ENCOUNTER — Inpatient Hospital Stay (HOSPITAL_COMMUNITY)
Admission: EM | Admit: 2019-04-09 | Discharge: 2019-04-14 | DRG: 469 | Disposition: A | Discharge status: Rehabilitation Facility | Payer: Medicare Other | Attending: Internal Medicine | Admitting: Internal Medicine

## 2019-04-09 DIAGNOSIS — S72002A Fracture of unspecified part of neck of left femur, initial encounter for closed fracture: Principal | ICD-10-CM | POA: Diagnosis present

## 2019-04-09 DIAGNOSIS — Z87891 Personal history of nicotine dependence: Secondary | ICD-10-CM

## 2019-04-09 DIAGNOSIS — Z888 Allergy status to other drugs, medicaments and biological substances status: Secondary | ICD-10-CM

## 2019-04-09 DIAGNOSIS — W19XXXA Unspecified fall, initial encounter: Secondary | ICD-10-CM | POA: Diagnosis not present

## 2019-04-09 DIAGNOSIS — K5903 Drug induced constipation: Secondary | ICD-10-CM

## 2019-04-09 DIAGNOSIS — R52 Pain, unspecified: Secondary | ICD-10-CM | POA: Diagnosis not present

## 2019-04-09 DIAGNOSIS — J9601 Acute respiratory failure with hypoxia: Secondary | ICD-10-CM | POA: Diagnosis not present

## 2019-04-09 DIAGNOSIS — Z96642 Presence of left artificial hip joint: Secondary | ICD-10-CM

## 2019-04-09 DIAGNOSIS — Z20828 Contact with and (suspected) exposure to other viral communicable diseases: Secondary | ICD-10-CM | POA: Diagnosis present

## 2019-04-09 DIAGNOSIS — J439 Emphysema, unspecified: Secondary | ICD-10-CM | POA: Diagnosis present

## 2019-04-09 DIAGNOSIS — S72032A Displaced midcervical fracture of left femur, initial encounter for closed fracture: Secondary | ICD-10-CM | POA: Diagnosis not present

## 2019-04-09 DIAGNOSIS — Z03818 Encounter for observation for suspected exposure to other biological agents ruled out: Secondary | ICD-10-CM | POA: Diagnosis not present

## 2019-04-09 DIAGNOSIS — Z9981 Dependence on supplemental oxygen: Secondary | ICD-10-CM

## 2019-04-09 DIAGNOSIS — E785 Hyperlipidemia, unspecified: Secondary | ICD-10-CM | POA: Diagnosis present

## 2019-04-09 DIAGNOSIS — D509 Iron deficiency anemia, unspecified: Secondary | ICD-10-CM | POA: Diagnosis present

## 2019-04-09 DIAGNOSIS — C8588 Other specified types of non-Hodgkin lymphoma, lymph nodes of multiple sites: Secondary | ICD-10-CM

## 2019-04-09 DIAGNOSIS — C8590 Non-Hodgkin lymphoma, unspecified, unspecified site: Secondary | ICD-10-CM | POA: Diagnosis not present

## 2019-04-09 DIAGNOSIS — D62 Acute posthemorrhagic anemia: Secondary | ICD-10-CM | POA: Diagnosis not present

## 2019-04-09 DIAGNOSIS — S299XXA Unspecified injury of thorax, initial encounter: Secondary | ICD-10-CM | POA: Diagnosis not present

## 2019-04-09 DIAGNOSIS — C8338 Diffuse large B-cell lymphoma, lymph nodes of multiple sites: Secondary | ICD-10-CM

## 2019-04-09 DIAGNOSIS — G8918 Other acute postprocedural pain: Secondary | ICD-10-CM

## 2019-04-09 DIAGNOSIS — R5383 Other fatigue: Secondary | ICD-10-CM

## 2019-04-09 DIAGNOSIS — R0902 Hypoxemia: Secondary | ICD-10-CM

## 2019-04-09 DIAGNOSIS — Z91048 Other nonmedicinal substance allergy status: Secondary | ICD-10-CM

## 2019-04-09 DIAGNOSIS — Z91041 Radiographic dye allergy status: Secondary | ICD-10-CM

## 2019-04-09 DIAGNOSIS — Z7982 Long term (current) use of aspirin: Secondary | ICD-10-CM

## 2019-04-09 DIAGNOSIS — W010XXA Fall on same level from slipping, tripping and stumbling without subsequent striking against object, initial encounter: Secondary | ICD-10-CM | POA: Diagnosis present

## 2019-04-09 DIAGNOSIS — Z800 Family history of malignant neoplasm of digestive organs: Secondary | ICD-10-CM

## 2019-04-09 DIAGNOSIS — R079 Chest pain, unspecified: Secondary | ICD-10-CM | POA: Diagnosis not present

## 2019-04-09 DIAGNOSIS — Z7951 Long term (current) use of inhaled steroids: Secondary | ICD-10-CM

## 2019-04-09 DIAGNOSIS — M25552 Pain in left hip: Secondary | ICD-10-CM | POA: Diagnosis not present

## 2019-04-09 DIAGNOSIS — C8330 Diffuse large B-cell lymphoma, unspecified site: Secondary | ICD-10-CM | POA: Diagnosis present

## 2019-04-09 DIAGNOSIS — K5900 Constipation, unspecified: Secondary | ICD-10-CM | POA: Diagnosis present

## 2019-04-09 DIAGNOSIS — I959 Hypotension, unspecified: Secondary | ICD-10-CM | POA: Diagnosis not present

## 2019-04-09 DIAGNOSIS — J449 Chronic obstructive pulmonary disease, unspecified: Secondary | ICD-10-CM | POA: Diagnosis present

## 2019-04-09 DIAGNOSIS — Z880 Allergy status to penicillin: Secondary | ICD-10-CM

## 2019-04-09 HISTORY — DX: Depression, unspecified: F32.A

## 2019-04-09 HISTORY — DX: Gastro-esophageal reflux disease without esophagitis: K21.9

## 2019-04-09 LAB — CMP (CANCER CENTER ONLY)
ALT: 12 U/L (ref 0–44)
AST: 20 U/L (ref 15–41)
Albumin: 4 g/dL (ref 3.5–5.0)
Alkaline Phosphatase: 124 U/L (ref 38–126)
Anion gap: 11 (ref 5–15)
BUN: 10 mg/dL (ref 8–23)
CO2: 28 mmol/L (ref 22–32)
Calcium: 9.2 mg/dL (ref 8.9–10.3)
Chloride: 103 mmol/L (ref 98–111)
Creatinine: 0.75 mg/dL (ref 0.44–1.00)
GFR, Est AFR Am: >60 mL/min (ref >60)
GFR, Estimated: >60 mL/min (ref >60)
Glucose, Bld: 82 mg/dL (ref 70–99)
Potassium: 3.7 mmol/L (ref 3.5–5.1)
Sodium: 142 mmol/L (ref 135–145)
Total Bilirubin: 0.6 mg/dL (ref 0.3–1.2)
Total Protein: 6.1 g/dL — ABNORMAL LOW (ref 6.5–8.1)

## 2019-04-09 LAB — CBC WITH DIFFERENTIAL/PLATELET
Abs Immature Granulocytes: 0.01 10*3/uL (ref 0.00–0.07)
Basophils Absolute: 0.1 10*3/uL (ref 0.0–0.1)
Basophils Relative: 2 %
Eosinophils Absolute: 0.5 10*3/uL (ref 0.0–0.5)
Eosinophils Relative: 9 %
HCT: 39.4 % (ref 36.0–46.0)
Hemoglobin: 13.2 g/dL (ref 12.0–15.0)
Immature Granulocytes: 0 %
Lymphocytes Relative: 22 %
Lymphs Abs: 1.1 10*3/uL (ref 0.7–4.0)
MCH: 33.8 pg (ref 26.0–34.0)
MCHC: 33.5 g/dL (ref 30.0–36.0)
MCV: 101 fL — ABNORMAL HIGH (ref 80.0–100.0)
Monocytes Absolute: 1.1 10*3/uL — ABNORMAL HIGH (ref 0.1–1.0)
Monocytes Relative: 21 %
Neutro Abs: 2.5 10*3/uL (ref 1.7–7.7)
Neutrophils Relative %: 46 %
Platelets: 218 10*3/uL (ref 150–400)
RBC: 3.9 MIL/uL (ref 3.87–5.11)
RDW: 13.2 % (ref 11.5–15.5)
WBC: 5.3 10*3/uL (ref 4.0–10.5)
nRBC: 0 % (ref 0.0–0.2)

## 2019-04-09 LAB — TSH: TSH: 3.698 u[IU]/mL (ref 0.308–3.960)

## 2019-04-09 LAB — T4, FREE: Free T4: 0.73 ng/dL (ref 0.61–1.12)

## 2019-04-09 MED ORDER — HEPARIN SOD (PORK) LOCK FLUSH 100 UNIT/ML IV SOLN
500.0000 [IU] | Freq: Once | INTRAVENOUS | Status: AC
  Administered 2019-04-09: 500 [IU] via INTRAVENOUS

## 2019-04-09 MED ORDER — SODIUM CHLORIDE 0.9% FLUSH
10.0000 mL | Freq: Once | INTRAVENOUS | Status: AC
  Administered 2019-04-09: 10 mL
  Filled 2019-04-09: qty 10

## 2019-04-09 MED ORDER — HEPARIN SOD (PORK) LOCK FLUSH 100 UNIT/ML IV SOLN
INTRAVENOUS | Status: AC
  Filled 2019-04-09: qty 5

## 2019-04-09 NOTE — Patient Instructions (Signed)

## 2019-04-09 NOTE — ED Provider Notes (Signed)
[facility] DEPT Provider Note   CSN: [id number] Arrival date & time: 04/09/19  2312     History   Chief Complaint Chief Complaint  Patient presents with  . Fall  . hip pain (L)    HPI Brooke Washington is a 78 y.o. female.     The history is provided by the patient and medical records.     78 year old female with history of non-Hodgkin's lymphoma, COPD, emphysema, hyperlipidemia, presenting to the ED after a fall.  States she was trying to put some items away in her pantry when she lost her footing and fell.  Reports she fell onto her left side on carpeted surface.  Reports she grazed her head against the floor but denies loss of consciousness.  She was unable to get up and walk on her own.  She reports ongoing pain isolated to the left hip.  She denies any other injuries or areas of pain currently.  She denies any numbness or weakness of her leg.  She denies any prior hip injuries or surgeries.  She takes daily aspirin but no other anticoagulation.  She did receive fentanyl with EMS which provided adequate pain relief.  Past Medical History:  Diagnosis Date  . Cancer ([place])  APRIL OF 2000   LOW-GRADE NON-HODGKIN'S LYMPHOMA  . COPD (chronic obstructive pulmonary disease) ([place])   . Emphysema of lung ([place])   . Hyperlipidemia     Patient Active Problem List   Diagnosis Date Noted  . Other fatigue 04/09/2019  . Leukopenia due to antineoplastic chemotherapy ([place]) 01/25/2019  . Bilateral arm pain 12/14/2018  . Pancytopenia, acquired ([place]) 06/20/2018  . Weight loss 06/20/2018  . Physical deconditioning 04/10/2018  . 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 Hypotension, unspecified 03/29/2018  . Chronic respiratory failure with hypoxia ([place]) 12/30/2017  . Other constipation 12/29/2017  . Neutropenic fever ([place]) 12/18/2017  . Thrombocytopenia ([place]) 12/18/2017  . Abdominal fullness 12/18/2017  . Acute urinary retention  12/18/2017  . Mucositis due to antineoplastic therapy 12/15/2017  . Heart burn 12/15/2017  . Frequent headaches 12/15/2017  . 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 Goals of care, counseling/discussion 11/29/2017  . Pathological fracture in neoplastic disease, right humerus, initial encounter for fracture 11/24/2017  . Hyperuricemia 11/22/2017  . Pathological fracture due to neoplastic disease, initial encounter 11/21/2017  . COPD GOLD II 07/05/2017  . COLONIC POLYPS, HYPERPLASTIC, HX OF 05/28/2010  . 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 Depression 05/05/2007    Past Surgical History:  Procedure Laterality Date  . 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 IR FLUORO GUIDE PORT INSERTION RIGHT  12/07/2017  . IR US GUIDE VASC ACCESS RIGHT  12/07/2017  . ORIF HUMERUS FRACTURE Right 11/23/2017   Procedure: OPEN REDUCTION INTERNAL FIXATION (ORIF) HUMERAL SHAFT FRACTURE;  Surgeon: Shona Needles, MD;  Location: [facility];  Service: Orthopedics;  Laterality: Right;  . RIGHT OOPHORECTOMY  1976     OB History   No obstetric history on file.      Home Medications    Prior to Admission medications   Medication Sig Start Date End Date Taking? Authorizing Provider  acetaminophen (TYLENOL) 325 MG tablet Take 2 tablets (650 mg total) by mouth every 6 (six) hours as needed for mild pain (or Fever >/= 101). 12/20/17   Kinnie Feil, MD  albuterol (PROVENTIL  HFA;VENTOLIN HFA) 108 (90 Base) MCG/ACT inhaler Inhale 2 puffs into the lungs every 6 (six) hours as needed for wheezing or shortness of breath. 10/23/18   [place], Betty G, MD  aspirin 81 MG tablet Take 81 mg by mouth daily.    [provider]  [place] Patter 100-25 MCG/INH AEPB ONE INHALTION INTO LUNGS EVERY DAY 09/19/18   Nafziger, Tommi Rumps, NP  cholecalciferol (VITAMIN D) 1000 units tablet Take 2,000 Units by mouth daily.    [provider]  citalopram (CELEXA) 40 MG tablet TAKE 1 TABLET BY MOUTH EVERY DAY 03/27/19   Nafziger, Tommi Rumps, NP  lenalidomide (REVLIMID) 10 MG capsule Take 1 capsule daily for 7 days and then take 7 days off. Repeat every 2 weeks for cycle of every 28 days 03/13/19   [name], MD  magic mouthwash w/lidocaine SOLN Take 5 mLs by mouth 4 (four) times daily. 08/03/18   [name], MD  pantoprazole (PROTONIX) 40 MG tablet Take 1 tablet (40 mg total) by mouth daily. 05/16/18   [name], MD    Family History Family History  Problem Relation Age of Onset  . Cancer Mother        colon ca    Social History Social History   Tobacco Use  . Smoking status: Former Smoker    Packs/day: 0.25    Years: 51.00    Pack years: 12.75    Types: Cigarettes    Quit date: 03/12/2015    Years since quitting: 4.0  . Smokeless tobacco: Never Used  Substance Use Topics  . Alcohol use: Yes    Alcohol/week: 0.0 standard drinks    Comment: 1 glass of wine monthly  . Drug use: No     Allergies   Contrast media [iodinated diagnostic agents], Iohexol, Povidone-iodine, Penicillins, and Adhesive [tape]   Review of Systems Review of Systems  Musculoskeletal: Positive for arthralgias.  All other systems reviewed and are negative.    Physical Exam Updated Vital Signs BP (!) 146/71 (BP Location: Left Arm)   Pulse 77   Temp 98 F (36.7 C)   Resp 16   Ht 5\' 4"  (1.626 m)   Wt 52.2 kg   SpO2 92%   BMI 19.74 kg/m   Physical Exam Vitals signs and nursing note reviewed.  Constitutional:      Appearance: She is well-developed.  HENT:     Head: Normocephalic and atraumatic.     Comments: No visible signs of head trauma Eyes:     Conjunctiva/sclera: Conjunctivae normal.     Pupils: Pupils are equal, round, and reactive to light.  Neck:     Musculoskeletal: Normal range of motion.  Cardiovascular:     Rate and Rhythm: Normal rate and regular rhythm.     Heart sounds: Normal heart sounds.   Pulmonary:     Effort: Pulmonary effort is normal.     Breath sounds: Normal breath sounds.  Abdominal:     General: Bowel sounds are normal.     Palpations: Abdomen is soft.  Musculoskeletal: Normal range of motion.     Comments: Left hip TTP, pain with any attempted movement of left leg; no tenderness throughout femur or lower leg, DP pulse intact; left leg appears shortened and externally rotated  Skin:    General: Skin is warm and dry.  Neurological:     Mental Status: She is alert and oriented to person, place, and time.     Comments: Awake, alert, fully oriented x3,  limited movement of left leg due to pain but moving all other extremities well without any noted ataxia, speech is clear and goal oriented      ED Treatments / Results  Labs (all labs ordered are listed, but only abnormal results are displayed) Labs Reviewed  BASIC METABOLIC PANEL - Abnormal; Notable for the following components:      Result Value   Glucose, Bld 108 (*)    Calcium 8.5 (*)    All other components within normal limits  CBC WITH DIFFERENTIAL/PLATELET - Abnormal; Notable for the following components:   RBC 3.71 (*)    MCV 103.2 (*)    MCH 34.8 (*)    All other components within normal limits  SARS CORONAVIRUS 2 (HOSPITAL ORDER, [facility])  PROTIME-INR  TYPE AND SCREEN  ABO/RH    EKG    Radiology   Dg Chest 1 View  Result Date: 04/10/2019 CLINICAL DATA:  Apparent pain status post fall red EXAM: CHEST  1 VIEW COMPARISON:  CT from June 07/01/2019 FINDINGS: Again noted are emphysematous changes bilaterally. There is a well-positioned right-sided Port-A-Cath. 2 there is no pneumothorax. The and the there is scarring at the lung bases. There is no acute osseous abnormality. Heart size is normal. Aortic calcifications are again seen. The no IMPRESSION: No active disease. Electronically Signed   By: Constance Holster M.D.   On: 04/10/2019 01:05     Dg Hip Unilat  With Pelvis 2-3 Views Left  Result Date: 04/10/2019 CLINICAL DATA:  Fall EXAM: DG HIP (WITH OR WITHOUT PELVIS) 2-3V LEFT COMPARISON:  None. FINDINGS: There is an acute impacted transcervical fracture of the proximal left femur. There are stable sclerotic lesions in the right ischium and right femur. A large amount of stool throughout the colon is noted. IMPRESSION: Acute impacted transcervical fracture of the proximal left femur Electronically Signed   By: Constance Holster M.D.   On: 04/10/2019 01:04    Procedures Procedures (including critical care time)  Medications Ordered in ED Medications - No data to display   Initial Impression / Assessment and Plan / ED Course  I have reviewed the triage vital signs and the nursing notes.  Pertinent labs & imaging results that were available during my care of the patient were reviewed by me and considered in my medical decision making (see chart for details).  78 y.o. F here after a mechanical fall at home while trying to put items into pantry.  Fell onto left side, grazed head against floor but no LOC.  Reports pain isolated to left hip.  No other injuries.  Left hip is TTP here, leg shortened and externally rotated.  DP pulse intact.  She does have history of lymphoma but no acute disease-- followed closely by heme/onc.  X-rays confirm left proximal femur fracture.  Patient updated with results.  Labs reassuring.  Pain controlled here with fentanyl and morphine.  Has had operations with Dr. Doreatha Martin in the past, will consult his group for recommendations.  Ortho paged x3, still awaiting call back.  3:11 AM Somewhat of a delay with ortho consult-- discussed with Dr. French Ana who is covering tonight, recommends [facility], will see if [facility] can add her on in the morning.  NPO after midnight in preparation for OR.  Discussed with Dr. Hal Hope-- he will admit, made aware of ortho recommendations.   Final Clinical Impressions(s) / ED  Diagnoses   Final diagnoses:  Closed left hip  fracture, initial encounter [facility])    ED Discharge Orders    None       Larene Pickett, PA-C 04/10/19 [place], MD 04/10/19 [phone]

## 2019-04-09 NOTE — ED Notes (Signed)
Please contact husband, Adryanna Friedt at [phone] with any updates.

## 2019-04-09 NOTE — ED Triage Notes (Signed)
Arrives from home via EMS. Patient was moving things at home and fell, is now having left hip pain. EMS noted some shortening and maybe possible rotation. Pulses intact. 100 mcg Fentanyl IV given via EMS, 50 mg and 50mg .

## 2019-04-09 NOTE — ED Notes (Signed)
Bed: JF35 Expected date:  Expected time:  Means of arrival:  Comments: 78 yo F/ fall leg injury

## 2019-04-10 ENCOUNTER — Encounter (HOSPITAL_COMMUNITY): Payer: Self-pay | Admitting: Internal Medicine

## 2019-04-10 ENCOUNTER — Inpatient Hospital Stay (HOSPITAL_COMMUNITY): Payer: Medicare Other | Admitting: Certified Registered Nurse Anesthetist

## 2019-04-10 ENCOUNTER — Inpatient Hospital Stay (HOSPITAL_COMMUNITY): Payer: Medicare Other

## 2019-04-10 ENCOUNTER — Encounter (HOSPITAL_COMMUNITY)
Admission: EM | Disposition: A | Discharge status: Rehabilitation Facility | Payer: Self-pay | Source: Home / Self Care | Attending: Internal Medicine

## 2019-04-10 ENCOUNTER — Emergency Department (HOSPITAL_COMMUNITY): Payer: Medicare Other

## 2019-04-10 ENCOUNTER — Inpatient Hospital Stay: Payer: Medicare Other | Admitting: Hematology and Oncology

## 2019-04-10 DIAGNOSIS — J439 Emphysema, unspecified: Secondary | ICD-10-CM | POA: Diagnosis not present

## 2019-04-10 DIAGNOSIS — R339 Retention of urine, unspecified: Secondary | ICD-10-CM | POA: Diagnosis not present

## 2019-04-10 DIAGNOSIS — R739 Hyperglycemia, unspecified: Secondary | ICD-10-CM | POA: Diagnosis present

## 2019-04-10 DIAGNOSIS — N390 Urinary tract infection, site not specified: Secondary | ICD-10-CM | POA: Diagnosis not present

## 2019-04-10 DIAGNOSIS — S72002D Fracture of unspecified part of neck of left femur, subsequent encounter for closed fracture with routine healing: Secondary | ICD-10-CM | POA: Diagnosis not present

## 2019-04-10 DIAGNOSIS — W010XXA Fall on same level from slipping, tripping and stumbling without subsequent striking against object, initial encounter: Secondary | ICD-10-CM | POA: Diagnosis present

## 2019-04-10 DIAGNOSIS — F329 Major depressive disorder, single episode, unspecified: Secondary | ICD-10-CM | POA: Diagnosis not present

## 2019-04-10 DIAGNOSIS — B9620 Unspecified Escherichia coli [E. coli] as the cause of diseases classified elsewhere: Secondary | ICD-10-CM | POA: Diagnosis not present

## 2019-04-10 DIAGNOSIS — Z96642 Presence of left artificial hip joint: Secondary | ICD-10-CM | POA: Diagnosis not present

## 2019-04-10 DIAGNOSIS — Z471 Aftercare following joint replacement surgery: Secondary | ICD-10-CM | POA: Diagnosis not present

## 2019-04-10 DIAGNOSIS — S72032A Displaced midcervical fracture of left femur, initial encounter for closed fracture: Secondary | ICD-10-CM | POA: Diagnosis not present

## 2019-04-10 DIAGNOSIS — Z90721 Acquired absence of ovaries, unilateral: Secondary | ICD-10-CM | POA: Diagnosis not present

## 2019-04-10 DIAGNOSIS — Z888 Allergy status to other drugs, medicaments and biological substances status: Secondary | ICD-10-CM | POA: Diagnosis not present

## 2019-04-10 DIAGNOSIS — C8330 Diffuse large B-cell lymphoma, unspecified site: Secondary | ICD-10-CM | POA: Diagnosis not present

## 2019-04-10 DIAGNOSIS — J9691 Respiratory failure, unspecified with hypoxia: Secondary | ICD-10-CM | POA: Diagnosis not present

## 2019-04-10 DIAGNOSIS — R9431 Abnormal electrocardiogram [ECG] [EKG]: Secondary | ICD-10-CM | POA: Diagnosis not present

## 2019-04-10 DIAGNOSIS — K5900 Constipation, unspecified: Secondary | ICD-10-CM | POA: Diagnosis present

## 2019-04-10 DIAGNOSIS — J449 Chronic obstructive pulmonary disease, unspecified: Secondary | ICD-10-CM | POA: Diagnosis not present

## 2019-04-10 DIAGNOSIS — S72042A Displaced fracture of base of neck of left femur, initial encounter for closed fracture: Secondary | ICD-10-CM | POA: Diagnosis not present

## 2019-04-10 DIAGNOSIS — S72002A Fracture of unspecified part of neck of left femur, initial encounter for closed fracture: Secondary | ICD-10-CM | POA: Diagnosis not present

## 2019-04-10 DIAGNOSIS — Z8781 Personal history of (healed) traumatic fracture: Secondary | ICD-10-CM | POA: Diagnosis not present

## 2019-04-10 DIAGNOSIS — F3340 Major depressive disorder, recurrent, in remission, unspecified: Secondary | ICD-10-CM | POA: Diagnosis not present

## 2019-04-10 DIAGNOSIS — G8918 Other acute postprocedural pain: Secondary | ICD-10-CM | POA: Diagnosis not present

## 2019-04-10 DIAGNOSIS — A499 Bacterial infection, unspecified: Secondary | ICD-10-CM | POA: Diagnosis not present

## 2019-04-10 DIAGNOSIS — K5903 Drug induced constipation: Secondary | ICD-10-CM | POA: Diagnosis not present

## 2019-04-10 DIAGNOSIS — D509 Iron deficiency anemia, unspecified: Secondary | ICD-10-CM | POA: Diagnosis present

## 2019-04-10 DIAGNOSIS — Z800 Family history of malignant neoplasm of digestive organs: Secondary | ICD-10-CM | POA: Diagnosis not present

## 2019-04-10 DIAGNOSIS — R0902 Hypoxemia: Secondary | ICD-10-CM | POA: Diagnosis not present

## 2019-04-10 DIAGNOSIS — E785 Hyperlipidemia, unspecified: Secondary | ICD-10-CM | POA: Diagnosis not present

## 2019-04-10 DIAGNOSIS — R0989 Other specified symptoms and signs involving the circulatory and respiratory systems: Secondary | ICD-10-CM | POA: Diagnosis not present

## 2019-04-10 DIAGNOSIS — M25552 Pain in left hip: Secondary | ICD-10-CM | POA: Diagnosis not present

## 2019-04-10 DIAGNOSIS — Z880 Allergy status to penicillin: Secondary | ICD-10-CM | POA: Diagnosis not present

## 2019-04-10 DIAGNOSIS — R079 Chest pain, unspecified: Secondary | ICD-10-CM | POA: Diagnosis not present

## 2019-04-10 DIAGNOSIS — W1839XD Other fall on same level, subsequent encounter: Secondary | ICD-10-CM | POA: Diagnosis not present

## 2019-04-10 DIAGNOSIS — K219 Gastro-esophageal reflux disease without esophagitis: Secondary | ICD-10-CM | POA: Diagnosis present

## 2019-04-10 DIAGNOSIS — S299XXA Unspecified injury of thorax, initial encounter: Secondary | ICD-10-CM | POA: Diagnosis not present

## 2019-04-10 DIAGNOSIS — Z91048 Other nonmedicinal substance allergy status: Secondary | ICD-10-CM | POA: Diagnosis not present

## 2019-04-10 DIAGNOSIS — Z9981 Dependence on supplemental oxygen: Secondary | ICD-10-CM | POA: Diagnosis not present

## 2019-04-10 DIAGNOSIS — Z87891 Personal history of nicotine dependence: Secondary | ICD-10-CM | POA: Diagnosis not present

## 2019-04-10 DIAGNOSIS — I959 Hypotension, unspecified: Secondary | ICD-10-CM | POA: Diagnosis not present

## 2019-04-10 DIAGNOSIS — Z20828 Contact with and (suspected) exposure to other viral communicable diseases: Secondary | ICD-10-CM | POA: Diagnosis not present

## 2019-04-10 DIAGNOSIS — Z887 Allergy status to serum and vaccine status: Secondary | ICD-10-CM | POA: Diagnosis not present

## 2019-04-10 DIAGNOSIS — D62 Acute posthemorrhagic anemia: Secondary | ICD-10-CM | POA: Diagnosis not present

## 2019-04-10 DIAGNOSIS — E876 Hypokalemia: Secondary | ICD-10-CM | POA: Diagnosis not present

## 2019-04-10 DIAGNOSIS — Z7982 Long term (current) use of aspirin: Secondary | ICD-10-CM | POA: Diagnosis not present

## 2019-04-10 DIAGNOSIS — Z8572 Personal history of non-Hodgkin lymphomas: Secondary | ICD-10-CM | POA: Diagnosis not present

## 2019-04-10 DIAGNOSIS — J9611 Chronic respiratory failure with hypoxia: Secondary | ICD-10-CM | POA: Diagnosis not present

## 2019-04-10 DIAGNOSIS — C8588 Other specified types of non-Hodgkin lymphoma, lymph nodes of multiple sites: Secondary | ICD-10-CM | POA: Diagnosis not present

## 2019-04-10 DIAGNOSIS — J9601 Acute respiratory failure with hypoxia: Secondary | ICD-10-CM | POA: Diagnosis not present

## 2019-04-10 DIAGNOSIS — Z91041 Radiographic dye allergy status: Secondary | ICD-10-CM | POA: Diagnosis not present

## 2019-04-10 DIAGNOSIS — Z7951 Long term (current) use of inhaled steroids: Secondary | ICD-10-CM | POA: Diagnosis not present

## 2019-04-10 HISTORY — PX: TOTAL HIP ARTHROPLASTY: SHX124

## 2019-04-10 LAB — CBC WITH DIFFERENTIAL/PLATELET
Abs Immature Granulocytes: 0.02 10*3/uL (ref 0.00–0.07)
Abs Immature Granulocytes: 0.02 10*3/uL (ref 0.00–0.07)
Basophils Absolute: 0 10*3/uL (ref 0.0–0.1)
Basophils Absolute: 0 10*3/uL (ref 0.0–0.1)
Basophils Relative: 0 %
Basophils Relative: 1 %
Eosinophils Absolute: 0.2 10*3/uL (ref 0.0–0.5)
Eosinophils Absolute: 0.4 10*3/uL (ref 0.0–0.5)
Eosinophils Relative: 2 %
Eosinophils Relative: 6 %
HCT: 38.3 % (ref 36.0–46.0)
HCT: 38.4 % (ref 36.0–46.0)
Hemoglobin: 12.9 g/dL (ref 12.0–15.0)
Hemoglobin: 13 g/dL (ref 12.0–15.0)
Immature Granulocytes: 0 %
Immature Granulocytes: 0 %
Lymphocytes Relative: 15 %
Lymphocytes Relative: 9 %
Lymphs Abs: 0.7 10*3/uL (ref 0.7–4.0)
Lymphs Abs: 0.8 10*3/uL (ref 0.7–4.0)
MCH: 34.3 pg — ABNORMAL HIGH (ref 26.0–34.0)
MCH: 34.8 pg — ABNORMAL HIGH (ref 26.0–34.0)
MCHC: 33.7 g/dL (ref 30.0–36.0)
MCHC: 33.9 g/dL (ref 30.0–36.0)
MCV: 101.3 fL — ABNORMAL HIGH (ref 80.0–100.0)
MCV: 103.2 fL — ABNORMAL HIGH (ref 80.0–100.0)
Monocytes Absolute: 0.9 10*3/uL (ref 0.1–1.0)
Monocytes Absolute: 1.4 10*3/uL — ABNORMAL HIGH (ref 0.1–1.0)
Monocytes Relative: 16 %
Monocytes Relative: 17 %
Neutro Abs: 3.6 10*3/uL (ref 1.7–7.7)
Neutro Abs: 5.9 10*3/uL (ref 1.7–7.7)
Neutrophils Relative %: 62 %
Neutrophils Relative %: 72 %
Platelets: 203 10*3/uL (ref 150–400)
Platelets: 207 10*3/uL (ref 150–400)
RBC: 3.71 MIL/uL — ABNORMAL LOW (ref 3.87–5.11)
RBC: 3.79 MIL/uL — ABNORMAL LOW (ref 3.87–5.11)
RDW: 13.3 % (ref 11.5–15.5)
RDW: 13.4 % (ref 11.5–15.5)
WBC: 5.8 10*3/uL (ref 4.0–10.5)
WBC: 8.2 10*3/uL (ref 4.0–10.5)
nRBC: 0 % (ref 0.0–0.2)
nRBC: 0 % (ref 0.0–0.2)

## 2019-04-10 LAB — BASIC METABOLIC PANEL
Anion gap: 11 (ref 5–15)
BUN: 12 mg/dL (ref 8–23)
CO2: 24 mmol/L (ref 22–32)
Calcium: 8.5 mg/dL — ABNORMAL LOW (ref 8.9–10.3)
Chloride: 106 mmol/L (ref 98–111)
Creatinine, Ser: 0.62 mg/dL (ref 0.44–1.00)
GFR calc Af Amer: >60 mL/min (ref >60)
GFR calc non Af Amer: >60 mL/min (ref >60)
Glucose, Bld: 108 mg/dL — ABNORMAL HIGH (ref 70–99)
Potassium: 3.6 mmol/L (ref 3.5–5.1)
Sodium: 141 mmol/L (ref 135–145)

## 2019-04-10 LAB — GLUCOSE, CAPILLARY
Glucose-Capillary: 116 mg/dL — ABNORMAL HIGH (ref 70–99)
Glucose-Capillary: 110 mg/dL — ABNORMAL HIGH (ref 70–99)

## 2019-04-10 LAB — TYPE AND SCREEN
ABO/RH(D): O POS
Antibody Screen: NEGATIVE

## 2019-04-10 LAB — COMPREHENSIVE METABOLIC PANEL
ALT: 16 U/L (ref 0–44)
AST: 24 U/L (ref 15–41)
Albumin: 3.9 g/dL (ref 3.5–5.0)
Alkaline Phosphatase: 105 U/L (ref 38–126)
Anion gap: 9 (ref 5–15)
BUN: 10 mg/dL (ref 8–23)
CO2: 27 mmol/L (ref 22–32)
Calcium: 8.6 mg/dL — ABNORMAL LOW (ref 8.9–10.3)
Chloride: 102 mmol/L (ref 98–111)
Creatinine, Ser: 0.82 mg/dL (ref 0.44–1.00)
GFR calc Af Amer: >60 mL/min (ref >60)
GFR calc non Af Amer: >60 mL/min (ref >60)
Glucose, Bld: 124 mg/dL — ABNORMAL HIGH (ref 70–99)
Potassium: 3.8 mmol/L (ref 3.5–5.1)
Sodium: 138 mmol/L (ref 135–145)
Total Bilirubin: 1.1 mg/dL (ref 0.3–1.2)
Total Protein: 5.7 g/dL — ABNORMAL LOW (ref 6.5–8.1)

## 2019-04-10 LAB — SARS CORONAVIRUS 2 BY RT PCR (HOSPITAL ORDER, PERFORMED IN ~~LOC~~ HOSPITAL LAB): SARS Coronavirus 2: NEGATIVE

## 2019-04-10 LAB — SURGICAL PCR SCREEN
MRSA, PCR: NEGATIVE
Staphylococcus aureus: NEGATIVE

## 2019-04-10 LAB — PROTIME-INR
INR: 0.9 (ref 0.8–1.2)
Prothrombin Time: 12 seconds (ref 11.4–15.2)

## 2019-04-10 LAB — ABO/RH
ABO/RH(D): O POS
ABO/RH(D): O POS

## 2019-04-10 SURGERY — ARTHROPLASTY, HIP, TOTAL, ANTERIOR APPROACH
Anesthesia: General | Site: Hip | Laterality: Left

## 2019-04-10 MED ORDER — DOCUSATE SODIUM 100 MG PO CAPS
100.0000 mg | ORAL_CAPSULE | Freq: Two times a day (BID) | ORAL | Status: DC
  Administered 2019-04-12 – 2019-04-14 (×5): 100 mg via ORAL
  Filled 2019-04-10 (×5): qty 1

## 2019-04-10 MED ORDER — ONDANSETRON HCL 4 MG/2ML IJ SOLN
INTRAMUSCULAR | Status: DC | PRN
  Administered 2019-04-10: 4 mg via INTRAVENOUS

## 2019-04-10 MED ORDER — FENTANYL CITRATE (PF) 250 MCG/5ML IJ SOLN
INTRAMUSCULAR | Status: DC | PRN
  Administered 2019-04-10: 100 ug via INTRAVENOUS
  Administered 2019-04-10: 50 ug via INTRAVENOUS

## 2019-04-10 MED ORDER — MORPHINE SULFATE (PF) 2 MG/ML IV SOLN
0.5000 mg | INTRAVENOUS | Status: DC | PRN
  Administered 2019-04-10: 0.5 mg via INTRAVENOUS
  Filled 2019-04-10: qty 1

## 2019-04-10 MED ORDER — MORPHINE SULFATE (PF) 4 MG/ML IV SOLN
4.0000 mg | Freq: Once | INTRAVENOUS | Status: AC
  Administered 2019-04-10: 4 mg via INTRAVENOUS
  Filled 2019-04-10: qty 1

## 2019-04-10 MED ORDER — MORPHINE SULFATE (PF) 2 MG/ML IV SOLN: 0.5000 mg | INTRAVENOUS | Status: DC | PRN

## 2019-04-10 MED ORDER — METOCLOPRAMIDE HCL 5 MG/ML IJ SOLN: 5.0000 mg | Freq: Three times a day (TID) | INTRAMUSCULAR | Status: DC | PRN

## 2019-04-10 MED ORDER — SENNA 8.6 MG PO TABS
1.0000 | ORAL_TABLET | Freq: Two times a day (BID) | ORAL | Status: DC
  Administered 2019-04-12 – 2019-04-14 (×5): 8.6 mg via ORAL
  Filled 2019-04-10 (×5): qty 1

## 2019-04-10 MED ORDER — MORPHINE SULFATE (PF) 2 MG/ML IV SOLN: 2.0000 mg | INTRAVENOUS | Status: DC | PRN

## 2019-04-10 MED ORDER — METOCLOPRAMIDE HCL 10 MG PO TABS: 5.0000 mg | ORAL_TABLET | Freq: Three times a day (TID) | ORAL | Status: DC | PRN

## 2019-04-10 MED ORDER — ALBUMIN HUMAN 5 % IV SOLN
INTRAVENOUS | Status: DC | PRN
  Administered 2019-04-10: 19:00:00 via INTRAVENOUS

## 2019-04-10 MED ORDER — FENTANYL CITRATE (PF) 250 MCG/5ML IJ SOLN
INTRAMUSCULAR | Status: AC
  Filled 2019-04-10: qty 5

## 2019-04-10 MED ORDER — ENOXAPARIN SODIUM 40 MG/0.4ML ~~LOC~~ SOLN
40.0000 mg | SUBCUTANEOUS | Status: DC
  Administered 2019-04-11 – 2019-04-14 (×4): 40 mg via SUBCUTANEOUS
  Filled 2019-04-10 (×4): qty 0.4

## 2019-04-10 MED ORDER — VANCOMYCIN HCL IN DEXTROSE 1-5 GM/200ML-% IV SOLN
INTRAVENOUS | Status: AC
  Administered 2019-04-10: 1000 mg via INTRAVENOUS
  Filled 2019-04-10: qty 200

## 2019-04-10 MED ORDER — ENSURE ENLIVE PO LIQD
237.0000 mL | Freq: Two times a day (BID) | ORAL | Status: DC
  Administered 2019-04-12 – 2019-04-13 (×3): 237 mL via ORAL

## 2019-04-10 MED ORDER — TRANEXAMIC ACID-NACL 1000-0.7 MG/100ML-% IV SOLN
INTRAVENOUS | Status: AC
  Filled 2019-04-10: qty 100

## 2019-04-10 MED ORDER — VANCOMYCIN HCL IN DEXTROSE 1-5 GM/200ML-% IV SOLN
1000.0000 mg | Freq: Two times a day (BID) | INTRAVENOUS | Status: AC
  Administered 2019-04-11: 1000 mg via INTRAVENOUS
  Filled 2019-04-10: qty 200

## 2019-04-10 MED ORDER — PHENYLEPHRINE 40 MCG/ML (10ML) SYRINGE FOR IV PUSH (FOR BLOOD PRESSURE SUPPORT)
PREFILLED_SYRINGE | INTRAVENOUS | Status: DC | PRN
  Administered 2019-04-10: 80 ug via INTRAVENOUS
  Administered 2019-04-10 (×2): 120 ug via INTRAVENOUS
  Administered 2019-04-10: 80 ug via INTRAVENOUS

## 2019-04-10 MED ORDER — HYDROCODONE-ACETAMINOPHEN 5-325 MG PO TABS
1.0000 | ORAL_TABLET | ORAL | Status: DC | PRN
  Administered 2019-04-12 – 2019-04-14 (×2): 1 via ORAL
  Filled 2019-04-10 (×2): qty 1

## 2019-04-10 MED ORDER — LIDOCAINE 2% (20 MG/ML) 5 ML SYRINGE
INTRAMUSCULAR | Status: DC | PRN
  Administered 2019-04-10: 60 mg via INTRAVENOUS

## 2019-04-10 MED ORDER — ONDANSETRON HCL 4 MG/2ML IJ SOLN: 4.0000 mg | Freq: Four times a day (QID) | INTRAMUSCULAR | Status: DC | PRN

## 2019-04-10 MED ORDER — CHLORHEXIDINE GLUCONATE 4 % EX LIQD
60.0000 mL | Freq: Once | CUTANEOUS | Status: AC
  Administered 2019-04-10: 4 via TOPICAL

## 2019-04-10 MED ORDER — TRANEXAMIC ACID-NACL 1000-0.7 MG/100ML-% IV SOLN: 1000.0000 mg | INTRAVENOUS | Status: DC

## 2019-04-10 MED ORDER — BUPIVACAINE-EPINEPHRINE (PF) 0.25% -1:200000 IJ SOLN
INTRAMUSCULAR | Status: AC
  Filled 2019-04-10: qty 30

## 2019-04-10 MED ORDER — SODIUM CHLORIDE (PF) 0.9 % IJ SOLN
INTRAMUSCULAR | Status: DC | PRN
  Administered 2019-04-10: 10 mL

## 2019-04-10 MED ORDER — ALBUTEROL SULFATE (2.5 MG/3ML) 0.083% IN NEBU
INHALATION_SOLUTION | RESPIRATORY_TRACT | Status: AC
  Filled 2019-04-10: qty 3

## 2019-04-10 MED ORDER — PHENOL 1.4 % MT LIQD: 1.0000 | OROMUCOSAL | Status: DC | PRN

## 2019-04-10 MED ORDER — SODIUM CHLORIDE 0.9 % IR SOLN
Status: DC | PRN
  Administered 2019-04-10: 3000 mL

## 2019-04-10 MED ORDER — IPRATROPIUM-ALBUTEROL 0.5-2.5 (3) MG/3ML IN SOLN
RESPIRATORY_TRACT | Status: AC
  Filled 2019-04-10: qty 3

## 2019-04-10 MED ORDER — PROPOFOL 10 MG/ML IV BOLUS
INTRAVENOUS | Status: DC | PRN
  Administered 2019-04-10: 100 mg via INTRAVENOUS

## 2019-04-10 MED ORDER — BUPIVACAINE-EPINEPHRINE (PF) 0.25% -1:200000 IJ SOLN
INTRAMUSCULAR | Status: DC | PRN
  Administered 2019-04-10: 30 mL

## 2019-04-10 MED ORDER — KETOROLAC TROMETHAMINE 30 MG/ML IJ SOLN
INTRAMUSCULAR | Status: AC
  Filled 2019-04-10: qty 1

## 2019-04-10 MED ORDER — ONDANSETRON HCL 4 MG/2ML IJ SOLN
4.0000 mg | Freq: Once | INTRAMUSCULAR | Status: AC
  Administered 2019-04-10: 4 mg via INTRAVENOUS
  Filled 2019-04-10: qty 2

## 2019-04-10 MED ORDER — ALBUTEROL SULFATE (2.5 MG/3ML) 0.083% IN NEBU: 2.5000 mg | INHALATION_SOLUTION | Freq: Four times a day (QID) | RESPIRATORY_TRACT | Status: DC | PRN

## 2019-04-10 MED ORDER — IPRATROPIUM-ALBUTEROL 0.5-2.5 (3) MG/3ML IN SOLN
3.0000 mL | Freq: Four times a day (QID) | RESPIRATORY_TRACT | Status: DC | PRN
  Administered 2019-04-10: 3 mL via RESPIRATORY_TRACT

## 2019-04-10 MED ORDER — ONDANSETRON HCL 4 MG/2ML IJ SOLN
4.0000 mg | Freq: Once | INTRAMUSCULAR | Status: AC | PRN
  Administered 2019-04-10: 4 mg via INTRAVENOUS
  Filled 2019-04-10: qty 2

## 2019-04-10 MED ORDER — ALBUMIN HUMAN 5 % IV SOLN
12.5000 g | Freq: Once | INTRAVENOUS | Status: AC
  Administered 2019-04-10: 12.5 g via INTRAVENOUS

## 2019-04-10 MED ORDER — ALBUTEROL SULFATE HFA 108 (90 BASE) MCG/ACT IN AERS
2.0000 | INHALATION_SPRAY | Freq: Four times a day (QID) | RESPIRATORY_TRACT | Status: DC | PRN
  Filled 2019-04-10: qty 6.7

## 2019-04-10 MED ORDER — OXYCODONE HCL 5 MG PO TABS: 5.0000 mg | ORAL_TABLET | Freq: Once | ORAL | Status: DC | PRN

## 2019-04-10 MED ORDER — FENTANYL CITRATE (PF) 100 MCG/2ML IJ SOLN
50.0000 ug | Freq: Once | INTRAMUSCULAR | Status: AC
  Administered 2019-04-10: 50 ug via INTRAVENOUS
  Filled 2019-04-10: qty 2

## 2019-04-10 MED ORDER — FLUTICASONE FUROATE-VILANTEROL 100-25 MCG/INH IN AEPB
1.0000 | INHALATION_SPRAY | RESPIRATORY_TRACT | Status: DC
  Administered 2019-04-10: 1 via RESPIRATORY_TRACT
  Filled 2019-04-10: qty 28

## 2019-04-10 MED ORDER — ROCURONIUM BROMIDE 10 MG/ML (PF) SYRINGE
PREFILLED_SYRINGE | INTRAVENOUS | Status: DC | PRN
  Administered 2019-04-10: 30 mg via INTRAVENOUS
  Administered 2019-04-10: 50 mg via INTRAVENOUS

## 2019-04-10 MED ORDER — SUGAMMADEX SODIUM 200 MG/2ML IV SOLN
INTRAVENOUS | Status: DC | PRN
  Administered 2019-04-10: 200 mg via INTRAVENOUS

## 2019-04-10 MED ORDER — BUPIVACAINE-EPINEPHRINE (PF) 0.5% -1:200000 IJ SOLN: INTRAMUSCULAR | Status: DC | PRN

## 2019-04-10 MED ORDER — ONDANSETRON HCL 4 MG/2ML IJ SOLN: 4.0000 mg | Freq: Once | INTRAMUSCULAR | Status: DC | PRN

## 2019-04-10 MED ORDER — ALBUMIN HUMAN 5 % IV SOLN
INTRAVENOUS | Status: AC
  Administered 2019-04-10
  Filled 2019-04-10: qty 250

## 2019-04-10 MED ORDER — 0.9 % SODIUM CHLORIDE (POUR BTL) OPTIME
TOPICAL | Status: DC | PRN
  Administered 2019-04-10: 1000 mL

## 2019-04-10 MED ORDER — OXYCODONE HCL 5 MG/5ML PO SOLN: 5.0000 mg | Freq: Once | ORAL | Status: DC | PRN

## 2019-04-10 MED ORDER — MENTHOL 3 MG MT LOZG: 1.0000 | LOZENGE | OROMUCOSAL | Status: DC | PRN

## 2019-04-10 MED ORDER — DEXAMETHASONE SODIUM PHOSPHATE 10 MG/ML IJ SOLN
INTRAMUSCULAR | Status: DC | PRN
  Administered 2019-04-10: 4 mg via INTRAVENOUS

## 2019-04-10 MED ORDER — LACTATED RINGERS IV SOLN
INTRAVENOUS | Status: DC
  Administered 2019-04-10: 1000 mL via INTRAVENOUS

## 2019-04-10 MED ORDER — LACTATED RINGERS IV SOLN
INTRAVENOUS | Status: DC | PRN
  Administered 2019-04-10 (×2): via INTRAVENOUS

## 2019-04-10 MED ORDER — ONDANSETRON HCL 4 MG PO TABS: 4.0000 mg | ORAL_TABLET | Freq: Four times a day (QID) | ORAL | Status: DC | PRN

## 2019-04-10 MED ORDER — ENSURE PRE-SURGERY PO LIQD
296.0000 mL | Freq: Once | ORAL | Status: DC
  Filled 2019-04-10: qty 296

## 2019-04-10 MED ORDER — ACETAMINOPHEN 325 MG PO TABS
325.0000 mg | ORAL_TABLET | Freq: Four times a day (QID) | ORAL | Status: DC | PRN
  Filled 2019-04-10: qty 2

## 2019-04-10 MED ORDER — FENTANYL CITRATE (PF) 100 MCG/2ML IJ SOLN: 25.0000 ug | INTRAMUSCULAR | Status: DC | PRN

## 2019-04-10 MED ORDER — HYDROCODONE-ACETAMINOPHEN 7.5-325 MG PO TABS: 1.0000 | ORAL_TABLET | ORAL | Status: DC | PRN

## 2019-04-10 MED ORDER — FENTANYL CITRATE (PF) 100 MCG/2ML IJ SOLN: 50.0000 ug | Freq: Once | INTRAMUSCULAR | Status: DC

## 2019-04-10 MED ORDER — PROPOFOL 10 MG/ML IV BOLUS
INTRAVENOUS | Status: AC
  Filled 2019-04-10: qty 20

## 2019-04-10 MED ORDER — KETOROLAC TROMETHAMINE 30 MG/ML IJ SOLN
INTRAMUSCULAR | Status: DC | PRN
  Administered 2019-04-10: 30 mg

## 2019-04-10 MED ORDER — SODIUM CHLORIDE 0.9 % IV SOLN
INTRAVENOUS | Status: AC | PRN
  Administered 2019-04-10: 1000 mL

## 2019-04-10 MED ORDER — VANCOMYCIN HCL IN DEXTROSE 1-5 GM/200ML-% IV SOLN
1000.0000 mg | INTRAVENOUS | Status: AC
  Administered 2019-04-10: 17:00:00 1000 mg via INTRAVENOUS

## 2019-04-10 SURGICAL SUPPLY — 62 items
ALCOHOL ISOPROPYL (RUBBING) (MISCELLANEOUS) ×3 IMPLANT
BLADE CLIPPER SURG (BLADE) IMPLANT
CHLORAPREP W/TINT 26 (MISCELLANEOUS) ×3 IMPLANT
COVER SURGICAL LIGHT HANDLE (MISCELLANEOUS) ×3 IMPLANT
COVER WAND RF STERILE (DRAPES) ×3 IMPLANT
CUP SECTOR GRIPTON 50MM (Cup) ×2 IMPLANT
DERMABOND ADVANCED (GAUZE/BANDAGES/DRESSINGS) ×2
DERMABOND ADVANCED .7 DNX12 (GAUZE/BANDAGES/DRESSINGS) ×2 IMPLANT
DRAPE C-ARM 42X72 X-RAY (DRAPES) ×3 IMPLANT
DRAPE STERI IOBAN 125X83 (DRAPES) ×3 IMPLANT
DRAPE U-SHAPE 47X51 STRL (DRAPES) ×9 IMPLANT
DRSG AQUACEL AG ADV 3.5X 6 (GAUZE/BANDAGES/DRESSINGS) ×2 IMPLANT
DRSG AQUACEL AG ADV 3.5X10 (GAUZE/BANDAGES/DRESSINGS) ×3 IMPLANT
ELECT BLADE 4.0 EZ CLEAN MEGAD (MISCELLANEOUS) ×3
ELECT PENCIL ROCKER SW 15FT (MISCELLANEOUS) ×3 IMPLANT
ELECT REM PT RETURN 9FT ADLT (ELECTROSURGICAL) ×3
ELECTRODE BLDE 4.0 EZ CLN MEGD (MISCELLANEOUS) ×1 IMPLANT
ELECTRODE REM PT RTRN 9FT ADLT (ELECTROSURGICAL) ×1 IMPLANT
EVACUATOR 1/8 PVC DRAIN (DRAIN) IMPLANT
GLOVE BIO SURGEON STRL SZ8.5 (GLOVE) ×6 IMPLANT
GLOVE BIOGEL PI IND STRL 8.5 (GLOVE) ×1 IMPLANT
GLOVE BIOGEL PI INDICATOR 8.5 (GLOVE) ×2
GOWN STRL REUS W/ TWL LRG LVL3 (GOWN DISPOSABLE) ×2 IMPLANT
GOWN STRL REUS W/TWL 2XL LVL3 (GOWN DISPOSABLE) ×5 IMPLANT
GOWN STRL REUS W/TWL LRG LVL3 (GOWN DISPOSABLE) ×4
HANDPIECE INTERPULSE COAX TIP (DISPOSABLE) ×2
HEAD FEM STD 32X+1 STRL (Hips) ×2 IMPLANT
HOOD PEEL AWAY FACE SHEILD DIS (HOOD) ×6 IMPLANT
JET LAVAGE IRRISEPT WOUND (IRRIGATION / IRRIGATOR) ×3
KIT BASIN OR (CUSTOM PROCEDURE TRAY) ×3 IMPLANT
KIT TURNOVER KIT B (KITS) ×3 IMPLANT
LAVAGE JET IRRISEPT WOUND (IRRIGATION / IRRIGATOR) IMPLANT
LINER ACETABULAR 32X50 (Liner) ×2 IMPLANT
MANIFOLD NEPTUNE II (INSTRUMENTS) ×3 IMPLANT
MARKER SKIN DUAL TIP RULER LAB (MISCELLANEOUS) ×6 IMPLANT
NDL SPNL 18GX3.5 QUINCKE PK (NEEDLE) ×1 IMPLANT
NEEDLE SPNL 18GX3.5 QUINCKE PK (NEEDLE) ×3 IMPLANT
NS IRRIG 1000ML POUR BTL (IV SOLUTION) ×3 IMPLANT
PACK TOTAL JOINT (CUSTOM PROCEDURE TRAY) ×3 IMPLANT
PACK UNIVERSAL I (CUSTOM PROCEDURE TRAY) ×3 IMPLANT
PAD ARMBOARD 7.5X6 YLW CONV (MISCELLANEOUS) ×6 IMPLANT
SAW OSC TIP CART 19.5X105X1.3 (SAW) ×3 IMPLANT
SEALER BIPOLAR AQUA 6.0 (INSTRUMENTS) ×2 IMPLANT
SET HNDPC FAN SPRY TIP SCT (DISPOSABLE) ×1 IMPLANT
SLEEVE CABLE 2MM VT (Orthopedic Implant) ×2 IMPLANT
SOL PREP POV-IOD 4OZ 10% (MISCELLANEOUS) ×1 IMPLANT
STEM TRI LOC GRIPTION SZ 6 STD IMPLANT
SUT ETHIBOND NAB CT1 #1 30IN (SUTURE) ×6 IMPLANT
SUT MNCRL AB 3-0 PS2 18 (SUTURE) ×5 IMPLANT
SUT MON AB 2-0 CT1 36 (SUTURE) ×3 IMPLANT
SUT VIC AB 1 CT1 27 (SUTURE) ×2
SUT VIC AB 1 CT1 27XBRD ANBCTR (SUTURE) ×1 IMPLANT
SUT VIC AB 2-0 CT1 27 (SUTURE) ×2
SUT VIC AB 2-0 CT1 TAPERPNT 27 (SUTURE) ×1 IMPLANT
SUT VLOC 180 0 24IN GS25 (SUTURE) ×3 IMPLANT
SYR 50ML LL SCALE MARK (SYRINGE) ×3 IMPLANT
TOWEL GREEN STERILE FF (TOWEL DISPOSABLE) ×3 IMPLANT
TOWEL OR 17X26 10 PK STRL BLUE (TOWEL DISPOSABLE) ×3 IMPLANT
TRAY CATH 16FR W/PLASTIC CATH (SET/KITS/TRAYS/PACK) IMPLANT
TRAY FOLEY CATH SILVER 16FR (SET/KITS/TRAYS/PACK) IMPLANT
TRI LOC GRIPTION SZ 6 STD ×3 IMPLANT
WATER STERILE IRR 1000ML POUR (IV SOLUTION) ×3 IMPLANT

## 2019-04-10 NOTE — Progress Notes (Signed)
Purewick hooked up to suction in SS prior to surgery.

## 2019-04-10 NOTE — H&P (View-Only) (Signed)
Reason for Consult:Left hip fx Referring Physician: A Adhikari  Brooke Washington is an 78 y.o. female.  HPI: Brooke Washington was backing out of her pantry and tripped and fell backwards landing on her left hip. She had immediate pain and could not get up. She was brought to the ED where x-rays showed a left femoral neck fx and orthopedic surgery was consulted. She c/o localized pain to the area only. She walks without an assistive device and is walking a mile or two a day for exercise. She lives at home with her husband.  Past Medical History:  Diagnosis Date  . Cancer ([place])  APRIL OF 2000   LOW-GRADE NON-HODGKIN'S LYMPHOMA  . COPD (chronic obstructive pulmonary disease) ([place])   . Emphysema of lung ([place])   . Hyperlipidemia     Past Surgical History:  Procedure Laterality Date  . 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 IR FLUORO GUIDE PORT INSERTION RIGHT  12/07/2017  . IR US GUIDE VASC ACCESS RIGHT  12/07/2017  . ORIF HUMERUS FRACTURE Right 11/23/2017   Procedure: OPEN REDUCTION INTERNAL FIXATION (ORIF) HUMERAL SHAFT FRACTURE;  Surgeon: Shona Needles, MD;  Location: [facility];  Service: Orthopedics;  Laterality: Right;  . RIGHT OOPHORECTOMY  1976    Family History  Problem Relation Age of Onset  . Cancer Mother        colon ca    Social History:  reports that she quit smoking about 4 years ago. Her smoking use included cigarettes. She has a 12.75 pack-year smoking history. She has never used smokeless tobacco. She reports current alcohol use. She reports that she does not use drugs.  Allergies:  Allergies  Allergen Reactions  . Contrast Media [Iodinated Diagnostic Agents] Shortness Of Breath, Rash and Other (See Comments)    Throat swells  . Iohexol Shortness Of Breath  . Povidone-Iodine Hives, Shortness Of Breath and Rash    REACTION: throat swells  . Penicillins Hives, Itching and Rash    Has patient had a PCN  reaction causing immediate rash, facial/tongue/throat swelling, SOB or lightheadedness with hypotension: Unknown Has patient had a PCN reaction causing severe rash involving mucus membranes or skin necrosis: Yes Has patient had a PCN reaction that required hospitalization: Unknown Has patient had a PCN reaction occurring within the last 10 years: Unknown If all of the above answers are "NO", then may proceed with Cephalosporin use.   . Adhesive [Tape] Hives    Medications: I have reviewed the patient's current medications.  Results for orders placed or performed during the hospital encounter of 04/09/19 (from the past 48 hour(s))  Basic metabolic panel     Status: Abnormal   Collection Time: 04/09/19 11:39 PM  Result Value Ref Range   Sodium 141 135 - 145 mmol/L   Potassium 3.6 3.5 - 5.1 mmol/L   Chloride 106 98 - 111 mmol/L   CO2 24 22 - 32 mmol/L   Glucose, Bld 108 (H) 70 - 99 mg/dL   BUN 12 8 - 23 mg/dL   Creatinine, Ser 0.62 0.44 - 1.00 mg/dL   Calcium 8.5 (L) 8.9 - 10.3 mg/dL   GFR calc non Af Amer >60 >60 mL/min   GFR calc Af Amer >60 >60 mL/min   Anion gap 11 5 - 15    Comment: Performed at [facility], [place] [address] [place], [place] [address]  CBC WITH DIFFERENTIAL  Status: Abnormal   Collection Time: 04/09/19 11:39 PM  Result Value Ref Range   WBC 5.8 4.0 - 10.5 K/uL   RBC 3.71 (L) 3.87 - 5.11 MIL/uL   Hemoglobin 12.9 12.0 - 15.0 g/dL   HCT 38.3 36.0 - 46.0 %   MCV 103.2 (H) 80.0 - 100.0 fL   MCH 34.8 (H) 26.0 - 34.0 pg   MCHC 33.7 30.0 - 36.0 g/dL   RDW 13.4 11.5 - 15.5 %   Platelets 203 150 - 400 K/uL   nRBC 0.0 0.0 - 0.2 %   Neutrophils Relative % 62 %   Neutro Abs 3.6 1.7 - 7.7 K/uL   Lymphocytes Relative 15 %   Lymphs Abs 0.8 0.7 - 4.0 K/uL   Monocytes Relative 16 %   Monocytes Absolute 0.9 0.1 - 1.0 K/uL   Eosinophils Relative 6 %   Eosinophils Absolute 0.4 0.0 - 0.5 K/uL   Basophils Relative 1 %   Basophils Absolute 0.0 0.0 -  0.1 K/uL   Immature Granulocytes 0 %   Abs Immature Granulocytes 0.02 0.00 - 0.07 K/uL    Comment: Performed at [facility], [place] [address], [place], [place] [address]  Protime-INR     Status: None   Collection Time: 04/09/19 11:39 PM  Result Value Ref Range   Prothrombin Time 12.0 11.4 - 15.2 seconds   INR 0.9 0.8 - 1.2    Comment: (NOTE) INR goal varies based on device and disease states. Performed at [facility], [place] [address], [place], [place] [address]   Type and screen [place]     Status: None   Collection Time: 04/09/19 11:39 PM  Result Value Ref Range   ABO/RH(D) O POS    Antibody Screen NEG    Sample Expiration      04/12/2019,2359 Performed at [facility], [place] [address], [place], [place] [address]   ABO/Rh     Status: None   Collection Time: 04/10/19 12:01 AM  Result Value Ref Range   ABO/RH(D)      O POS Performed at [facility], [place] [address] [place],  [address]   SARS Coronavirus 2 (CEPHEID - Performed in [place] hospital lab), Hosp Order     Status: None   Collection Time: 04/10/19  1:17 AM   Specimen: Nasopharyngeal Swab  Result Value Ref Range   SARS Coronavirus 2 NEGATIVE NEGATIVE    Comment: (NOTE) If result is NEGATIVE SARS-CoV-2 target nucleic acids are NOT DETECTED. The SARS-CoV-2 RNA is generally detectable in upper and lower  respiratory specimens during the acute phase of infection. The lowest  concentration of SARS-CoV-2 viral copies this assay can detect is 250  copies / mL. A negative result does not preclude SARS-CoV-2 infection  and should not be used as the sole basis for treatment or other  patient management decisions.  A negative result may occur with  improper specimen collection / handling, submission of specimen other  than nasopharyngeal swab, presence of viral mutation(s) within the  areas targeted by this assay, and  inadequate number of viral copies  (<250 copies / mL). A negative result must be combined with clinical  observations, patient history, and epidemiological information. If result is POSITIVE SARS-CoV-2 target nucleic acids are DETECTED. The SARS-CoV-2 RNA is generally detectable in upper and lower  respiratory specimens dur ing the acute phase of infection.  Positive  results are indicative of active infection  with SARS-CoV-2.  Clinical  correlation with patient history and other diagnostic information is  necessary to determine patient infection status.  Positive results do  not rule out bacterial infection or co-infection with other viruses. If result is PRESUMPTIVE POSTIVE SARS-CoV-2 nucleic acids MAY BE PRESENT.   A presumptive positive result was obtained on the submitted specimen  and confirmed on repeat testing.  While 2019 novel coronavirus  (SARS-CoV-2) nucleic acids may be present in the submitted sample  additional confirmatory testing may be necessary for epidemiological  and / or clinical management purposes  to differentiate between  SARS-CoV-2 and other Sarbecovirus currently known to infect humans.  If clinically indicated additional testing with an alternate test  methodology [phone]) is advised. The SARS-CoV-2 RNA is generally  detectable in upper and lower respiratory sp ecimens during the acute  phase of infection. The expected result is Negative. Fact Sheet for Patients:  StrictlyIdeas.no Fact Sheet for Healthcare Providers: BankingDealers.co.za This test is not yet approved or cleared by the [place] FDA and has been authorized for detection and/or diagnosis of SARS-CoV-2 by FDA under an Emergency Use Authorization (EUA).  This EUA will remain in effect (meaning this test can be used) for the duration of the COVID-19 declaration under Section 564(b)(1) of the Act, 21 U.S.C. section 360bbb-3(b)(1), unless the  authorization is terminated or revoked sooner. Performed at [facility], [place] [address], [place], [place] [address]   Comprehensive metabolic panel     Status: Abnormal   Collection Time: 04/10/19  6:20 AM  Result Value Ref Range   Sodium 138 135 - 145 mmol/L   Potassium 3.8 3.5 - 5.1 mmol/L   Chloride 102 98 - 111 mmol/L   CO2 27 22 - 32 mmol/L   Glucose, Bld 124 (H) 70 - 99 mg/dL   BUN 10 8 - 23 mg/dL   Creatinine, Ser 0.82 0.44 - 1.00 mg/dL   Calcium 8.6 (L) 8.9 - 10.3 mg/dL   Total Protein 5.7 (L) 6.5 - 8.1 g/dL   Albumin 3.9 3.5 - 5.0 g/dL   AST 24 15 - 41 U/L   ALT 16 0 - 44 U/L   Alkaline Phosphatase 105 38 - 126 U/L   Total Bilirubin 1.1 0.3 - 1.2 mg/dL   GFR calc non Af Amer >60 >60 mL/min   GFR calc Af Amer >60 >60 mL/min   Anion gap 9 5 - 15    Comment: Performed at [place] [address], [place], [place] [address]  CBC WITH DIFFERENTIAL     Status: Abnormal   Collection Time: 04/10/19  6:20 AM  Result Value Ref Range   WBC 8.2 4.0 - 10.5 K/uL   RBC 3.79 (L) 3.87 - 5.11 MIL/uL   Hemoglobin 13.0 12.0 - 15.0 g/dL   HCT 38.4 36.0 - 46.0 %   MCV 101.3 (H) 80.0 - 100.0 fL   MCH 34.3 (H) 26.0 - 34.0 pg   MCHC 33.9 30.0 - 36.0 g/dL   RDW 13.3 11.5 - 15.5 %   Platelets 207 150 - 400 K/uL   nRBC 0.0 0.0 - 0.2 %   Neutrophils Relative % 72 %   Neutro Abs 5.9 1.7 - 7.7 K/uL   Lymphocytes Relative 9 %   Lymphs Abs 0.7 0.7 - 4.0 K/uL   Monocytes Relative 17 %   Monocytes Absolute 1.4 (H) 0.1 - 1.0 K/uL   Eosinophils Relative 2 %   Eosinophils Absolute 0.2 0.0 -  0.5 K/uL   Basophils Relative 0 %   Basophils Absolute 0.0 0.0 - 0.1 K/uL   Immature Granulocytes 0 %   Abs Immature Granulocytes 0.02 0.00 - 0.07 K/uL    Comment: Performed at [facility], [place] [address] [place], [place] [address]  Glucose, capillary     Status: Abnormal   Collection Time: 04/10/19  6:25 AM  Result Value Ref Range   Glucose-Capillary 116 (H) 70 - 99  mg/dL    Ct Abdomen Pelvis Wo Contrast  Result Date: 04/09/2019 CLINICAL DATA:  Non-Hodgkin's lymphoma. EXAM: CT CHEST, ABDOMEN AND PELVIS WITHOUT CONTRAST TECHNIQUE: Multidetector CT imaging of the chest, abdomen and pelvis was performed following the standard protocol without IV contrast. COMPARISON:  Multiple PET-CT scans from 2019 FINDINGS: CT CHEST FINDINGS Cardiovascular: The heart is normal in size. No pericardial effusion. Stable moderate tortuosity, ectasia and calcification of the thoracic aorta. Stable coronary artery calcifications. Mediastinum/Nodes: No mediastinal or hilar mass or lymphadenopathy. The esophagus is grossly normal. There is a moderate to large hiatal hernia. Lungs/Pleura: Stable emphysematous changes and pulmonary scarring. No worrisome pulmonary lesions or pulmonary nodules. No pleural effusion or pleural lesions. Musculoskeletal: Degenerative changes and mild scoliosis and osteoporosis but no worrisome bone lesions. Remote healed sternal fracture. No breast masses, supraclavicular or axillary adenopathy. Extensive carotid artery calcifications are noted. The thyroid gland is grossly normal. The right Port-A-Cath is stable. CT ABDOMEN PELVIS FINDINGS Hepatobiliary: No focal hepatic lesions are identified without contrast. The gallbladder appears normal. No common bile duct dilatation. Pancreas: No mass, inflammation or ductal dilatation. Spleen: Normal size.  No focal lesions. Adrenals/Urinary Tract: The adrenal glands and kidneys are unremarkable. No renal, ureteral or bladder calculi or obvious mass without contrast. Stomach/Bowel: The stomach, duodenum, small bowel and colon are unremarkable. No acute inflammatory changes, mass lesions or obstructive findings. Vascular/Lymphatic: Stable advanced atherosclerotic calcifications involving the aorta, iliac arteries and branch vessels but no aneurysm. No mesenteric or retroperitoneal mass or adenopathy. Reproductive: The uterus and  ovaries are unremarkable for age. Other: No pelvic mass or pelvic lymphadenopathy. No inguinal lymphadenopathy. Musculoskeletal: No significant bony findings. No worrisome bone lesions. Remote healed right pubic rami fractures are noted. Small sclerotic areas in the greater trochanter and right ischium may be related to small bone infarcts. No change. IMPRESSION: 1. No CT findings without contrast to suggest recurrent lymphoma involving the chest, abdomen or pelvis. 2. Stable advanced atherosclerotic calcifications involving the thoracic and abdominal aorta and branch vessels. 3. Moderate to large hiatal hernia. 4. No significant bony findings. Electronically Signed   By: Marijo Sanes M.D.   On: 04/09/2019 11:36   Dg Chest 1 View  Result Date: 04/10/2019 CLINICAL DATA:  Apparent pain status post fall red EXAM: CHEST  1 VIEW COMPARISON:  CT from June 07/01/2019 FINDINGS: Again noted are emphysematous changes bilaterally. There is a well-positioned right-sided Port-A-Cath. 2 there is no pneumothorax. The and the there is scarring at the lung bases. There is no acute osseous abnormality. Heart size is normal. Aortic calcifications are again seen. The no IMPRESSION: No active disease. Electronically Signed   By: Constance Holster M.D.   On: 04/10/2019 01:05   Ct Chest Wo Contrast  Result Date: 04/09/2019 CLINICAL DATA:  Non-Hodgkin's lymphoma. EXAM: CT CHEST, ABDOMEN AND PELVIS WITHOUT CONTRAST TECHNIQUE: Multidetector CT imaging of the chest, abdomen and pelvis was performed following the standard protocol without IV contrast. COMPARISON:  Multiple PET-CT scans from 2019 FINDINGS: CT CHEST FINDINGS Cardiovascular:  The heart is normal in size. No pericardial effusion. Stable moderate tortuosity, ectasia and calcification of the thoracic aorta. Stable coronary artery calcifications. Mediastinum/Nodes: No mediastinal or hilar mass or lymphadenopathy. The esophagus is grossly normal. There is a moderate to large  hiatal hernia. Lungs/Pleura: Stable emphysematous changes and pulmonary scarring. No worrisome pulmonary lesions or pulmonary nodules. No pleural effusion or pleural lesions. Musculoskeletal: Degenerative changes and mild scoliosis and osteoporosis but no worrisome bone lesions. Remote healed sternal fracture. No breast masses, supraclavicular or axillary adenopathy. Extensive carotid artery calcifications are noted. The thyroid gland is grossly normal. The right Port-A-Cath is stable. CT ABDOMEN PELVIS FINDINGS Hepatobiliary: No focal hepatic lesions are identified without contrast. The gallbladder appears normal. No common bile duct dilatation. Pancreas: No mass, inflammation or ductal dilatation. Spleen: Normal size.  No focal lesions. Adrenals/Urinary Tract: The adrenal glands and kidneys are unremarkable. No renal, ureteral or bladder calculi or obvious mass without contrast. Stomach/Bowel: The stomach, duodenum, small bowel and colon are unremarkable. No acute inflammatory changes, mass lesions or obstructive findings. Vascular/Lymphatic: Stable advanced atherosclerotic calcifications involving the aorta, iliac arteries and branch vessels but no aneurysm. No mesenteric or retroperitoneal mass or adenopathy. Reproductive: The uterus and ovaries are unremarkable for age. Other: No pelvic mass or pelvic lymphadenopathy. No inguinal lymphadenopathy. Musculoskeletal: No significant bony findings. No worrisome bone lesions. Remote healed right pubic rami fractures are noted. Small sclerotic areas in the greater trochanter and right ischium may be related to small bone infarcts. No change. IMPRESSION: 1. No CT findings without contrast to suggest recurrent lymphoma involving the chest, abdomen or pelvis. 2. Stable advanced atherosclerotic calcifications involving the thoracic and abdominal aorta and branch vessels. 3. Moderate to large hiatal hernia. 4. No significant bony findings. Electronically Signed   By: Marijo Sanes M.D.   On: 04/09/2019 11:36   Dg Hip Unilat With Pelvis 2-3 Views Left  Result Date: 04/10/2019 CLINICAL DATA:  Fall EXAM: DG HIP (WITH OR WITHOUT PELVIS) 2-3V LEFT COMPARISON:  None. FINDINGS: There is an acute impacted transcervical fracture of the proximal left femur. There are stable sclerotic lesions in the right ischium and right femur. A large amount of stool throughout the colon is noted. IMPRESSION: Acute impacted transcervical fracture of the proximal left femur Electronically Signed   By: Constance Holster M.D.   On: 04/10/2019 01:04    Review of Systems  Constitutional: Negative for weight loss.  HENT: Negative for ear discharge, ear pain, hearing loss and tinnitus.   Eyes: Negative for blurred vision, double vision, photophobia and pain.  Respiratory: Negative for cough, sputum production and shortness of breath.   Cardiovascular: Negative for chest pain.  Gastrointestinal: Negative for abdominal pain, nausea and vomiting.  Genitourinary: Negative for dysuria, flank pain, frequency and urgency.  Musculoskeletal: Positive for joint pain (Left hip). Negative for back pain, falls, myalgias and neck pain.  Neurological: Negative for dizziness, tingling, sensory change, focal weakness, loss of consciousness and headaches.  Endo/Heme/Allergies: Does not bruise/bleed easily.  Psychiatric/Behavioral: Negative for depression, memory loss and substance abuse. The patient is not nervous/anxious.    Blood pressure 119/69, pulse 87, temperature 98.7 F (37.1 C), temperature source Oral, resp. rate 16, height 5\' 4"  (1.626 m), weight 52.2 kg, SpO2 94 %. Physical Exam  Constitutional: She appears well-developed and well-nourished. No distress.  HENT:  Head: Normocephalic and atraumatic.  Eyes: Conjunctivae are normal. Right eye exhibits no discharge. Left eye exhibits no discharge. No scleral icterus.  Neck: Normal  range of motion.  Cardiovascular: Normal rate and regular rhythm.   Respiratory: Effort normal. No respiratory distress.  Musculoskeletal:     Comments: LLE No traumatic wounds, ecchymosis, or rash  Hip mild TTP  No knee or ankle effusion  Knee stable to varus/ valgus and anterior/posterior stress  Sens DPN, SPN, TN intact  Motor EHL, ext, flex, evers 5/5  DP 1+, PT 1+, No significant edema  Neurological: She is alert.  Skin: Skin is warm and dry. She is not diaphoretic.  Psychiatric: She has a normal mood and affect. Her behavior is normal.    Assessment/Plan: Left femoral neck fx -- Given her activity level and age she should probably receive a THA. Will plan on that this afternoon with Dr. Lyla Glassing. Please keep NPO. Lymphoma -- No signs of recurrance per oncology note in May COPD [place], PA-C Orthopedic Surgery [phone] 04/10/2019, 9:16 AM

## 2019-04-10 NOTE — OR Nursing (Signed)
Care of patient assumed at 1910.

## 2019-04-10 NOTE — Anesthesia Preprocedure Evaluation (Signed)
Anesthesia Evaluation  Patient identified by MRN, date of birth, ID band Patient awake    Reviewed: Allergy & Precautions, NPO status , Patient's Chart, lab work & pertinent test results  History of Anesthesia Complications Negative for: history of anesthetic complications  Airway Mallampati: II  TM Distance: >3 FB Neck ROM: Full    Dental  (+) Teeth Intact   Pulmonary COPD, former smoker,    Pulmonary exam normal        Cardiovascular negative cardio ROS Normal cardiovascular exam     Neuro/Psych PSYCHIATRIC DISORDERS Depression negative neurological ROS     GI/Hepatic Neg liver ROS, GERD  ,  Endo/Other  negative endocrine ROS  Renal/GU negative Renal ROS  negative genitourinary   Musculoskeletal negative musculoskeletal ROS (+)   Abdominal   Peds  Hematology negative hematology ROS (+)   Anesthesia Other Findings Non-Hodgkin's Lymphoma  Reproductive/Obstetrics                            Anesthesia Physical Anesthesia Plan  ASA: II  Anesthesia Plan: General   Post-op Pain Management:    Induction: Intravenous  PONV Risk Score and Plan: 3 and Ondansetron, Dexamethasone and Treatment may vary due to age or medical condition  Airway Management Planned: Oral ETT  Additional Equipment: None  Intra-op Plan:   Post-operative Plan: Extubation in OR  Informed Consent: I have reviewed the patients History and Physical, chart, labs and discussed the procedure including the risks, benefits and alternatives for the proposed anesthesia with the patient or authorized representative who has indicated his/her understanding and acceptance.     Dental advisory given  Plan Discussed with:   Anesthesia Plan Comments:         Anesthesia Quick Evaluation

## 2019-04-10 NOTE — Progress Notes (Signed)
Patient is a 78 year old female with history of lymphoma on Revlimid, COPD who presents from home after a mechanical fall.  X-ray done in the emergency department showed left transcervical proximal femur fracture. She is usually ambulatory at home and ambulates without the help of walker or cane.  She lives with her husband. Orthopedics has been consulted and she has been planned for  Operative intervention. Patient seen and examined the bedside this morning.  Currently she is hemodynamically stable.  She denies severe pain on the left hip.  Her left lower extremity is slightly rotated outward.  There is no overlying bruise or ulcer on the left hip.  Tenderness of the left hip noted. Her COPD is currently stable.  She does not smoke currently.  She does not use oxygen at home.  She is on inhalers.  She follows with oncology for her lymphoma and just finished chemotherapy treatment and is on remission.  She has Port-A-Cath on the right chest. Patient seen by Dr. Hal Hope this morning.

## 2019-04-10 NOTE — Discharge Instructions (Signed)
°Dr. Cyniah Gossard °Joint Replacement Specialist °[facility] °[address] °[place], [place] [address] °[phone] ° ° °TOTAL HIP REPLACEMENT POSTOPERATIVE DIRECTIONS ° ° ° °Hip Rehabilitation, Guidelines Following Surgery  ° °WEIGHT BEARING °Weight bearing as tolerated with assist device (walker, cane, etc) as directed, use it as long as suggested by your surgeon or therapist, typically at least 4-6 weeks. ° °The results of a hip operation are greatly improved after range of motion and muscle strengthening exercises. Follow all safety measures which are given to protect your hip. If any of these exercises cause increased pain or swelling in your joint, decrease the amount until you are comfortable again. Then slowly increase the exercises. Call your caregiver if you have problems or questions.  ° °HOME CARE INSTRUCTIONS  °Most of the following instructions are designed to prevent the dislocation of your new hip.  °Remove items at home which could result in a fall. This includes throw rugs or furniture in walking pathways.  °Continue medications as instructed at time of discharge. °· You may have some home medications which will be placed on hold until you complete the course of blood thinner medication. °· You may start showering once you are discharged home. Do not remove your dressing. °Do not put on socks or shoes without following the instructions of your caregivers.   °Sit on chairs with arms. Use the chair arms to help push yourself up when arising.  °Arrange for the use of a toilet seat elevator so you are not sitting low.  °· Walk with walker as instructed.  °You may resume a sexual relationship in one month or when given the OK by your caregiver.  °Use walker as long as suggested by your caregivers.  °You may put full weight on your legs and walk as much as is comfortable. °Avoid periods of inactivity such as sitting longer than an hour when not asleep. This helps prevent  blood clots.  °You may return to work once you are cleared by your surgeon.  °Do not drive a car for 6 weeks or until released by your surgeon.  °Do not drive while taking narcotics.  °Wear elastic stockings for two weeks following surgery during the day but you may remove then at night.  °Make sure you keep all of your appointments after your operation with all of your doctors and caregivers. You should call the office at the above phone number and make an appointment for approximately two weeks after the date of your surgery. °Please pick up a stool softener and laxative for home use as long as you are requiring pain medications. °· ICE to the affected hip every three hours for 30 minutes at a time and then as needed for pain and swelling. Continue to use ice on the hip for pain and swelling from surgery. You may notice swelling that will progress down to the foot and ankle.  This is normal after surgery.  Elevate the leg when you are not up walking on it.   °It is important for you to complete the blood thinner medication as prescribed by your doctor. °· Continue to use the breathing machine which will help keep your temperature down.  It is common for your temperature to cycle up and down following surgery, especially at night when you are not up moving around and exerting yourself.  The breathing machine keeps your lungs expanded and your temperature down. ° °RANGE OF MOTION AND STRENGTHENING EXERCISES  °These exercises are   designed to help you keep full movement of your hip joint. Follow your caregiver's or physical therapist's instructions. Perform all exercises about fifteen times, three times per day or as directed. Exercise both hips, even if you have had only one joint replacement. These exercises can be done on a training (exercise) mat, on the floor, on a table or on a bed. Use whatever works the best and is most comfortable for you. Use music or television while you are exercising so that the exercises  are a pleasant break in your day. This will make your life better with the exercises acting as a break in routine you can look forward to.  °Lying on your back, slowly slide your foot toward your buttocks, raising your knee up off the floor. Then slowly slide your foot back down until your leg is straight again.  °Lying on your back spread your legs as far apart as you can without causing discomfort.  °Lying on your side, raise your upper leg and foot straight up from the floor as far as is comfortable. Slowly lower the leg and repeat.  °Lying on your back, tighten up the muscle in the front of your thigh (quadriceps muscles). You can do this by keeping your leg straight and trying to raise your heel off the floor. This helps strengthen the largest muscle supporting your knee.  °Lying on your back, tighten up the muscles of your buttocks both with the legs straight and with the knee bent at a comfortable angle while keeping your heel on the floor.  ° °SKILLED REHAB INSTRUCTIONS: °If the patient is transferred to a skilled rehab facility following release from the hospital, a list of the current medications will be sent to the facility for the patient to continue.  When discharged from the skilled rehab facility, please have the facility set up the patient's Home Health Physical Therapy prior to being released. Also, the skilled facility will be responsible for providing the patient with their medications at time of release from the facility to include their pain medication and their blood thinner medication. If the patient is still at the rehab facility at time of the two week follow up appointment, the skilled rehab facility will also need to assist the patient in arranging follow up appointment in our office and any transportation needs. ° °MAKE SURE YOU:  °Understand these instructions.  °Will watch your condition.  °Will get help right away if you are not doing well or get worse. ° °Pick up stool softner and  laxative for home use following surgery while on pain medications. °Do not remove your dressing. °The dressing is waterproof--it is OK to take showers. °Continue to use ice for pain and swelling after surgery. °Do not use any lotions or creams on the incision until instructed by your surgeon. °Total Hip Protocol. ° ° °

## 2019-04-10 NOTE — Progress Notes (Deleted)
Report attempted 

## 2019-04-10 NOTE — Progress Notes (Signed)
Took over care of pt.

## 2019-04-10 NOTE — Progress Notes (Signed)
CSW acknowledges SNF consult, pending PT/OT recs.   Gustavia Carie, LCSW [phone]  

## 2019-04-10 NOTE — ED Notes (Signed)
Report given to Ivin Booty, RN at [facility]. [facility] called for transport.

## 2019-04-10 NOTE — Anesthesia Procedure Notes (Signed)
Procedure Name: Intubation Performed by: Milford Cage, CRNA Pre-anesthesia Checklist: Patient identified, Emergency Drugs available, Suction available and Patient being monitored Patient Re-evaluated:Patient Re-evaluated prior to induction Oxygen Delivery Method: Circle System Utilized Preoxygenation: Pre-oxygenation with 100% oxygen Induction Type: IV induction Ventilation: Mask ventilation without difficulty Laryngoscope Size: Mac and 3 Grade View: Grade II Tube type: Oral Tube size: 7.0 mm Number of attempts: 1 Airway Equipment and Method: Stylet and Oral airway Placement Confirmation: ETT inserted through vocal cords under direct vision,  positive ETCO2 and breath sounds checked- equal and bilateral Secured at: 21 cm Tube secured with: Tape Dental Injury: Teeth and Oropharynx as per pre-operative assessment

## 2019-04-10 NOTE — ED Notes (Signed)
ED TO INPATIENT HANDOFF REPORT  ED Nurse Name and Phone #: [place] [facility], [phone]  S Name/Age/Gender Brooke Washington 78 y.o. female Room/Bed: WA11/WA11  Code Status   Code Status: Prior  Home/SNF/Other Home Patient oriented to: self, place, time and situation Is this baseline? Yes   Triage Complete: Triage complete  Chief Complaint Fall  Triage Note Arrives from home via EMS. Patient was moving things at home and fell, is now having left hip pain. EMS noted some shortening and maybe possible rotation. Pulses intact. 100 mcg Fentanyl IV given via EMS, 50 mg and 50mg .    Allergies Allergies  Allergen Reactions  . Contrast Media [Iodinated Diagnostic Agents] Shortness Of Breath, Rash and Other (See Comments)    Throat swells  . Iohexol Shortness Of Breath  . Povidone-Iodine Hives, Shortness Of Breath and Rash    REACTION: throat swells  . Penicillins Hives, Itching and Rash    Has patient had a PCN reaction causing immediate rash, facial/tongue/throat swelling, SOB or lightheadedness with hypotension: Unknown Has patient had a PCN reaction causing severe rash involving mucus membranes or skin necrosis: Yes Has patient had a PCN reaction that required hospitalization: Unknown Has patient had a PCN reaction occurring within the last 10 years: Unknown If all of the above answers are "NO", then may proceed with Cephalosporin use.   . Adhesive [Tape] Hives    Level of Care/Admitting Diagnosis ED Disposition    ED Disposition Condition Comment   Admit  Hospital Area: [facility] [[id number]]  Level of Care: Telemetry Medical [104]  Covid Evaluation: Screening Protocol (No Symptoms)  Diagnosis: Closed left hip fracture, initial encounter [facility]) [[id number]]  Admitting Physician: Rise Patience [phone]  Attending Physician: Rise Patience [phone]  Estimated length of stay: past midnight tomorrow  Certification:: I certify this patient will need inpatient  services for at least 2 midnights  PT Class (Do Not Modify): Inpatient [101]  PT Acc Code (Do Not Modify): Private [1]       B Medical/Surgery History Past Medical History:  Diagnosis Date  . 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 COPD (chronic obstructive pulmonary disease) ([place])   . Emphysema of lung ([place])   . Hyperlipidemia    Past Surgical History:  Procedure Laterality Date  . 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 IR FLUORO GUIDE PORT INSERTION RIGHT  12/07/2017  . IR US GUIDE VASC ACCESS RIGHT  12/07/2017  . ORIF HUMERUS FRACTURE Right 11/23/2017   Procedure: OPEN REDUCTION INTERNAL FIXATION (ORIF) HUMERAL SHAFT FRACTURE;  Surgeon: Shona Needles, MD;  Location: [facility];  Service: Orthopedics;  Laterality: Right;  . RIGHT OOPHORECTOMY  1976     A IV Location/Drains/Wounds Patient Lines/Drains/Airways Status   Active Line/Drains/Airways    Name:   Placement date:   Placement time:   Site:   Days:   Implanted Port 12/07/17 Right Chest   12/07/17    -    Chest   489   Peripheral IV 04/09/19 Right Antecubital   04/09/19    2329    Antecubital   1          Intake/Output Last 24 hours No intake or output data in the 24 hours ending 04/10/19 0421  Labs/Imaging Results for orders placed or performed during the hospital encounter of 04/09/19 (from the past 48 hour(s))  Basic metabolic  panel     Status: Abnormal   Collection Time: 04/09/19 11:39 PM  Result Value Ref Range   Sodium 141 135 - 145 mmol/L   Potassium 3.6 3.5 - 5.1 mmol/L   Chloride 106 98 - 111 mmol/L   CO2 24 22 - 32 mmol/L   Glucose, Bld 108 (H) 70 - 99 mg/dL   BUN 12 8 - 23 mg/dL   Creatinine, Ser 0.62 0.44 - 1.00 mg/dL   Calcium 8.5 (L) 8.9 - 10.3 mg/dL   GFR calc non Af Amer >60 >60 mL/min   GFR calc Af Amer >60 >60 mL/min   Anion gap 11 5 - 15    Comment: Performed at [facility], [place] [address], [place], [place] [address]  CBC WITH DIFFERENTIAL     Status: Abnormal   Collection Time: 04/09/19 11:39 PM  Result Value Ref Range   WBC 5.8 4.0 - 10.5 K/uL   RBC 3.71 (L) 3.87 - 5.11 MIL/uL   Hemoglobin 12.9 12.0 - 15.0 g/dL   HCT 38.3 36.0 - 46.0 %   MCV 103.2 (H) 80.0 - 100.0 fL   MCH 34.8 (H) 26.0 - 34.0 pg   MCHC 33.7 30.0 - 36.0 g/dL   RDW 13.4 11.5 - 15.5 %   Platelets 203 150 - 400 K/uL   nRBC 0.0 0.0 - 0.2 %   Neutrophils Relative % 62 %   Neutro Abs 3.6 1.7 - 7.7 K/uL   Lymphocytes Relative 15 %   Lymphs Abs 0.8 0.7 - 4.0 K/uL   Monocytes Relative 16 %   Monocytes Absolute 0.9 0.1 - 1.0 K/uL   Eosinophils Relative 6 %   Eosinophils Absolute 0.4 0.0 - 0.5 K/uL   Basophils Relative 1 %   Basophils Absolute 0.0 0.0 - 0.1 K/uL   Immature Granulocytes 0 %   Abs Immature Granulocytes 0.02 0.00 - 0.07 K/uL    Comment: Performed at [facility], [place] [address] [place], [place] [address]  Protime-INR     Status: None   Collection Time: 04/09/19 11:39 PM  Result Value Ref Range   Prothrombin Time 12.0 11.4 - 15.2 seconds   INR 0.9 0.8 - 1.2    Comment: (NOTE) INR goal varies based on device and disease states. Performed at [facility], [place] [address] [place], [place] [address]   Type and screen [place]     Status: None   Collection Time: 04/09/19 11:39 PM  Result Value Ref Range   ABO/RH(D) O POS    Antibody Screen NEG    Sample Expiration      04/12/2019,2359 Performed at [facility], [place] [address], [place],  [address]   SARS Coronavirus 2 (CEPHEID - Performed in [facility] hospital lab), Hosp Order     Status: None   Collection Time: 04/10/19  1:17 AM   Specimen: Nasopharyngeal Swab  Result Value Ref Range   SARS Coronavirus 2 NEGATIVE NEGATIVE    Comment: (NOTE) If result is NEGATIVE SARS-CoV-2 target nucleic acids are NOT DETECTED. The  SARS-CoV-2 RNA is generally detectable in upper and lower  respiratory specimens during the acute phase of infection. The lowest  concentration of SARS-CoV-2 viral copies this assay can detect is 250  copies / mL. A negative result does not preclude SARS-CoV-2 infection  and should not be used as the sole basis for treatment or other  patient management decisions.  A negative  result may occur with  improper specimen collection / handling, submission of specimen other  than nasopharyngeal swab, presence of viral mutation(s) within the  areas targeted by this assay, and inadequate number of viral copies  (<250 copies / mL). A negative result must be combined with clinical  observations, patient history, and epidemiological information. If result is POSITIVE SARS-CoV-2 target nucleic acids are DETECTED. The SARS-CoV-2 RNA is generally detectable in upper and lower  respiratory specimens dur ing the acute phase of infection.  Positive  results are indicative of active infection with SARS-CoV-2.  Clinical  correlation with patient history and other diagnostic information is  necessary to determine patient infection status.  Positive results do  not rule out bacterial infection or co-infection with other viruses. If result is PRESUMPTIVE POSTIVE SARS-CoV-2 nucleic acids MAY BE PRESENT.   A presumptive positive result was obtained on the submitted specimen  and confirmed on repeat testing.  While 2019 novel coronavirus  (SARS-CoV-2) nucleic acids may be present in the submitted sample  additional confirmatory testing may be necessary for epidemiological  and / or clinical management purposes  to differentiate between  SARS-CoV-2 and other Sarbecovirus currently known to infect humans.  If clinically indicated additional testing with an alternate test  methodology [phone]) is advised. The SARS-CoV-2 RNA is generally  detectable in upper and lower respiratory sp ecimens during the acute   phase of infection. The expected result is Negative. Fact Sheet for Patients:  StrictlyIdeas.no Fact Sheet for Healthcare Providers: BankingDealers.co.za This test is not yet approved or cleared by the [place] FDA and has been authorized for detection and/or diagnosis of SARS-CoV-2 by FDA under an Emergency Use Authorization (EUA).  This EUA will remain in effect (meaning this test can be used) for the duration of the COVID-19 declaration under Section 564(b)(1) of the Act, 21 U.S.C. section 360bbb-3(b)(1), unless the authorization is terminated or revoked sooner. Performed at [facility], [place] [address] [place], [place] [address]    Ct Abdomen Pelvis Wo Contrast  Result Date: 04/09/2019 CLINICAL DATA:  Non-Hodgkin's lymphoma. EXAM: CT CHEST, ABDOMEN AND PELVIS WITHOUT CONTRAST TECHNIQUE: Multidetector CT imaging of the chest, abdomen and pelvis was performed following the standard protocol without IV contrast. COMPARISON:  Multiple PET-CT scans from 2019 FINDINGS: CT CHEST FINDINGS Cardiovascular: The heart is normal in size. No pericardial effusion. Stable moderate tortuosity, ectasia and calcification of the thoracic aorta. Stable coronary artery calcifications. Mediastinum/Nodes: No mediastinal or hilar mass or lymphadenopathy. The esophagus is grossly normal. There is a moderate to large hiatal hernia. Lungs/Pleura: Stable emphysematous changes and pulmonary scarring. No worrisome pulmonary lesions or pulmonary nodules. No pleural effusion or pleural lesions. Musculoskeletal: Degenerative changes and mild scoliosis and osteoporosis but no worrisome bone lesions. Remote healed sternal fracture. No breast masses, supraclavicular or axillary adenopathy. Extensive carotid artery calcifications are noted. The thyroid gland is grossly normal. The right Port-A-Cath is stable. CT ABDOMEN PELVIS FINDINGS Hepatobiliary: No focal  hepatic lesions are identified without contrast. The gallbladder appears normal. No common bile duct dilatation. Pancreas: No mass, inflammation or ductal dilatation. Spleen: Normal size.  No focal lesions. Adrenals/Urinary Tract: The adrenal glands and kidneys are unremarkable. No renal, ureteral or bladder calculi or obvious mass without contrast. Stomach/Bowel: The stomach, duodenum, small bowel and colon are unremarkable. No acute inflammatory changes, mass lesions or obstructive findings. Vascular/Lymphatic: Stable advanced atherosclerotic calcifications involving the aorta, iliac arteries and branch vessels but no aneurysm. No mesenteric or retroperitoneal mass  or adenopathy. Reproductive: The uterus and ovaries are unremarkable for age. Other: No pelvic mass or pelvic lymphadenopathy. No inguinal lymphadenopathy. Musculoskeletal: No significant bony findings. No worrisome bone lesions. Remote healed right pubic rami fractures are noted. Small sclerotic areas in the greater trochanter and right ischium may be related to small bone infarcts. No change. IMPRESSION: 1. No CT findings without contrast to suggest recurrent lymphoma involving the chest, abdomen or pelvis. 2. Stable advanced atherosclerotic calcifications involving the thoracic and abdominal aorta and branch vessels. 3. Moderate to large hiatal hernia. 4. No significant bony findings. Electronically Signed   By: Marijo Sanes M.D.   On: 04/09/2019 11:36   Dg Chest 1 View  Result Date: 04/10/2019 CLINICAL DATA:  Apparent pain status post fall red EXAM: CHEST  1 VIEW COMPARISON:  CT from June 07/01/2019 FINDINGS: Again noted are emphysematous changes bilaterally. There is a well-positioned right-sided Port-A-Cath. 2 there is no pneumothorax. The and the there is scarring at the lung bases. There is no acute osseous abnormality. Heart size is normal. Aortic calcifications are again seen. The no IMPRESSION: No active disease. Electronically Signed    By: Constance Holster M.D.   On: 04/10/2019 01:05   Ct Chest Wo Contrast  Result Date: 04/09/2019 CLINICAL DATA:  Non-Hodgkin's lymphoma. EXAM: CT CHEST, ABDOMEN AND PELVIS WITHOUT CONTRAST TECHNIQUE: Multidetector CT imaging of the chest, abdomen and pelvis was performed following the standard protocol without IV contrast. COMPARISON:  Multiple PET-CT scans from 2019 FINDINGS: CT CHEST FINDINGS Cardiovascular: The heart is normal in size. No pericardial effusion. Stable moderate tortuosity, ectasia and calcification of the thoracic aorta. Stable coronary artery calcifications. Mediastinum/Nodes: No mediastinal or hilar mass or lymphadenopathy. The esophagus is grossly normal. There is a moderate to large hiatal hernia. Lungs/Pleura: Stable emphysematous changes and pulmonary scarring. No worrisome pulmonary lesions or pulmonary nodules. No pleural effusion or pleural lesions. Musculoskeletal: Degenerative changes and mild scoliosis and osteoporosis but no worrisome bone lesions. Remote healed sternal fracture. No breast masses, supraclavicular or axillary adenopathy. Extensive carotid artery calcifications are noted. The thyroid gland is grossly normal. The right Port-A-Cath is stable. CT ABDOMEN PELVIS FINDINGS Hepatobiliary: No focal hepatic lesions are identified without contrast. The gallbladder appears normal. No common bile duct dilatation. Pancreas: No mass, inflammation or ductal dilatation. Spleen: Normal size.  No focal lesions. Adrenals/Urinary Tract: The adrenal glands and kidneys are unremarkable. No renal, ureteral or bladder calculi or obvious mass without contrast. Stomach/Bowel: The stomach, duodenum, small bowel and colon are unremarkable. No acute inflammatory changes, mass lesions or obstructive findings. Vascular/Lymphatic: Stable advanced atherosclerotic calcifications involving the aorta, iliac arteries and branch vessels but no aneurysm. No mesenteric or retroperitoneal mass or  adenopathy. Reproductive: The uterus and ovaries are unremarkable for age. Other: No pelvic mass or pelvic lymphadenopathy. No inguinal lymphadenopathy. Musculoskeletal: No significant bony findings. No worrisome bone lesions. Remote healed right pubic rami fractures are noted. Small sclerotic areas in the greater trochanter and right ischium may be related to small bone infarcts. No change. IMPRESSION: 1. No CT findings without contrast to suggest recurrent lymphoma involving the chest, abdomen or pelvis. 2. Stable advanced atherosclerotic calcifications involving the thoracic and abdominal aorta and branch vessels. 3. Moderate to large hiatal hernia. 4. No significant bony findings. Electronically Signed   By: Marijo Sanes M.D.   On: 04/09/2019 11:36   Dg Hip Unilat With Pelvis 2-3 Views Left  Result Date: 04/10/2019 CLINICAL DATA:  Fall EXAM: DG HIP (WITH OR WITHOUT  PELVIS) 2-3V LEFT COMPARISON:  None. FINDINGS: There is an acute impacted transcervical fracture of the proximal left femur. There are stable sclerotic lesions in the right ischium and right femur. A large amount of stool throughout the colon is noted. IMPRESSION: Acute impacted transcervical fracture of the proximal left femur Electronically Signed   By: Constance Holster M.D.   On: 04/10/2019 01:04    Pending Labs Unresulted Labs (From admission, onward)    Start     Ordered   04/10/19 0001  ABO/Rh  Once,   R     04/10/19 0001          Vitals/Pain Today's Vitals   04/10/19 0230 04/10/19 0300 04/10/19 0330 04/10/19 0400  BP: (!) 123/55 (!) 109/50 120/65 (!) 109/57  Pulse: 80 85 92 87  Resp: 20 18 17  (!) 23  Temp:      SpO2: 95% 94% 92% 92%  Weight:      Height:      PainSc:        Isolation Precautions No active isolations  Medications Medications  fentaNYL (SUBLIMAZE) injection 50 mcg (50 mcg Intravenous Given 04/10/19 0030)  ondansetron (ZOFRAN) injection 4 mg (4 mg Intravenous Given 04/10/19 0030)  morphine 4  MG/ML injection 4 mg (4 mg Intravenous Given 04/10/19 0201)    Mobility walks Low fall risk

## 2019-04-10 NOTE — Progress Notes (Signed)
Initial Nutrition Assessment   RD working remotely.  DOCUMENTATION CODES:   Not applicable  INTERVENTION:  Once diet advanced, provide Ensure Enlive po BID, each supplement provides 350 kcal and 20 grams of protein.  NUTRITION DIAGNOSIS:   Increased nutrient needs related to post-op healing as evidenced by estimated needs.  GOAL:   Patient will meet greater than or equal to 90% of their needs  MONITOR:   Supplement acceptance, Diet advancement, Skin, Weight trends, Labs, I & O's  REASON FOR ASSESSMENT:   Consult Hip fracture protocol  ASSESSMENT:   78 year old female with history of lymphoma on Revlimid, COPD who presents from home after a mechanical fall.  X-ray done in the emergency department showed left transcervical proximal femur fracture.  Pt currently on OR. RD unable to obtain pt nutrition history. Pt with no significant weight loss per weight records. Pt has received one dose of Ensure Pre-surgery prior to OR. RD to order Ensure Enlive post surgery to aid in caloric and protein needs as well as in healing.    Unable to complete Nutrition-Focused physical exam at this time.   Labs and medications reviewed.   Diet Order:   Diet Order            Diet NPO time specified Except for: Sips with Meds  Diet effective midnight        Diet NPO time specified  Diet effective now              EDUCATION NEEDS:   Not appropriate for education at this time  Skin:  Skin Assessment: Reviewed RN Assessment  Last BM:  6/29  Height:   Ht Readings from Last 1 Encounters:  04/10/19 5\' 4"  (1.626 m)    Weight:   Wt Readings from Last 1 Encounters:  04/10/19 52.2 kg    Ideal Body Weight:  54.54 kg  BMI:  Body mass index is 19.74 kg/m.  Estimated Nutritional Needs:   Kcal:  1650-1850  Protein:  80-90 grams  Fluid:  >/= 1.7 L/day    Corrin Parker, MS, RD, LDN Pager # [phone] After hours/ weekend pager # [phone]

## 2019-04-10 NOTE — H&P (Signed)
History and Physical    Brooke Washington TKP:[id number] DOB: 09-20-1941 DOA: 04/09/2019  PCP: [name], NP  Patient coming from: Home.  Chief Complaint: Fall.  HPI: Brooke Washington is a 78 y.o. female with history of lymphoma on Revlimid, COPD was brought to the ER after patient had a mechanical fall at home.  Patient states she was trying to pick something and while turning patient lost her balance and fell.  Did not lose consciousness or did not have any chest pain or palpitations.  She fell onto her left side and hurt her left hip.  Was brought to the ER.  ED Course: In the ER patient's x-ray showed left transcervical proximal femur fracture.  On-call orthopedic surgeon Dr. Doreatha Martin was notified and patient is being transferred to [facility].  EKG shows normal sinus rhythm.  Chest x-ray unremarkable.  Labs reveal hemoglobin of 12.9 platelets 203 creatinine 0.6.  Patient was given pain relief medications.  Review of Systems: As per HPI, rest all negative.   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 Her smoking use included cigarettes. She has a 12.75 pack-year smoking history. She has never used smokeless tobacco. She reports current alcohol use. She reports that she  does not use drugs.  Allergies  Allergen Reactions   Contrast Media [Iodinated Diagnostic Agents] Shortness Of Breath, Rash and Other (See Comments)    Throat swells   Iohexol Shortness Of Breath   Povidone-Iodine Hives, Shortness Of Breath and Rash    REACTION: throat swells   Penicillins Hives, Itching and Rash    Has patient had a PCN reaction causing immediate rash, facial/tongue/throat swelling, SOB or lightheadedness with hypotension: Unknown Has patient had a PCN reaction causing severe rash involving mucus membranes or skin necrosis: Yes Has patient had a PCN reaction that required hospitalization: Unknown Has patient had a PCN reaction occurring within the last 10 years: Unknown If all of the above answers are "NO", then may proceed with Cephalosporin use.    Adhesive [Tape] Hives    Family History  Problem Relation Age of Onset   Cancer Mother        colon ca    Prior to Admission medications   Medication Sig Start Date End Date Taking? Authorizing Provider  acetaminophen (TYLENOL) 325 MG tablet Take 2 tablets (650 mg total) by mouth every 6 (six) hours as needed for mild pain (or Fever >/= 101). 12/20/17  Yes Buriev, Arie Sabina, MD  albuterol (PROVENTIL HFA;VENTOLIN HFA) 108 (90 Base) MCG/ACT inhaler Inhale 2 puffs into the lungs every 6 (six) hours as needed for wheezing or shortness of breath. 10/23/18  Yes Martinique, Betty G, MD  aspirin 81 MG tablet Take  81 mg by mouth daily.   Yes [provider]  BREO ELLIPTA 100-25 MCG/INH AEPB ONE [place] DAY Patient taking differently: Inhale 1 puff into the lungs every other day.  09/19/18  Yes Nafziger, Tommi Rumps, NP  cholecalciferol (VITAMIN D) 1000 units tablet Take 2,000 Units by mouth daily.   Yes [provider]  citalopram (CELEXA) 40 MG tablet TAKE 1 TABLET BY MOUTH EVERY DAY Patient taking differently: Take 40 mg by mouth daily.  03/27/19  Yes Nafziger, Tommi Rumps, NP  lenalidomide (REVLIMID)  10 MG capsule Take 1 capsule daily for 7 days and then take 7 days off. Repeat every 2 weeks for cycle of every 28 days 03/13/19  Yes Gorsuch, Ni, MD  magic mouthwash w/lidocaine SOLN Take 5 mLs by mouth 4 (four) times daily. Patient not taking: Reported on 04/10/2019 08/03/18   [name], MD  pantoprazole (PROTONIX) 40 MG tablet Take 1 tablet (40 mg total) by mouth daily. Patient not taking: Reported on 04/10/2019 05/16/18   [name], MD    Physical Exam:  Constitutional: Moderately built and nourished. Vitals:   04/10/19 0230 04/10/19 0300 04/10/19 0330 04/10/19 0400  BP: (!) 123/55 (!) 109/50 120/65 (!) 109/57  Pulse: 80 85 92 87  Resp: 20 18 17  (!) 23  Temp:      SpO2: 95% 94% 92% 92%  Weight:      Height:       Eyes: Anicteric no pallor. ENMT: No discharge from the ears eyes nose or mouth. Neck: No mass or.  No JVD appreciated. Respiratory: No rhonchi or crepitations. Cardiovascular: S1-S2 heard. Abdomen: Soft nontender bowel sounds present. Musculoskeletal: Pain on moving left hip. Skin: No rash. Neurologic: Alert awake oriented to time place and person.  Moves all extremities. Psychiatric: Appears normal per normal affect.   Labs on Admission: I have personally reviewed following labs and imaging studies  CBC: Recent Labs  Lab 04/09/19 0905 04/09/19 2339  WBC 5.3 5.8  NEUTROABS 2.5 3.6  HGB 13.2 12.9  HCT 39.4 38.3  MCV 101.0* 103.2*  PLT 218 419   Basic Metabolic Panel: Recent Labs  Lab 04/09/19 0905 04/09/19 2339  NA 142 141  K 3.7 3.6  CL 103 106  CO2 28 24  GLUCOSE 82 108*  BUN 10 12  CREATININE 0.75 0.62  CALCIUM 9.2 8.5*   GFR: Estimated Creatinine Clearance: 47.8 mL/min (by C-G formula based on SCr of 0.62 mg/dL). Liver Function Tests: Recent Labs  Lab 04/09/19 0905  AST 20  ALT 12  ALKPHOS 124  BILITOT 0.6  PROT 6.1*  ALBUMIN 4.0   No results for input(s): LIPASE, AMYLASE in the last 168 hours. No results for input(s): AMMONIA in  the last 168 hours. Coagulation Profile: Recent Labs  Lab 04/09/19 2339  INR 0.9   Cardiac Enzymes: No results for input(s): CKTOTAL, CKMB, CKMBINDEX, TROPONINI in the last 168 hours. BNP (last 3 results) No results for input(s): PROBNP in the last 8760 hours. HbA1C: No results for input(s): HGBA1C in the last 72 hours. CBG: No results for input(s): GLUCAP in the last 168 hours. Lipid Profile: No results for input(s): CHOL, HDL, LDLCALC, TRIG, CHOLHDL, LDLDIRECT in the last 72 hours. Thyroid Function Tests: Recent Labs    04/09/19 0905  TSH 3.698  FREET4 0.73   Anemia Panel: No results for input(s): VITAMINB12, FOLATE, FERRITIN, TIBC, IRON, RETICCTPCT in the last 72 hours. Urine analysis:    Component Value Date/Time  COLORURINE YELLOW 12/17/2017 [place] 12/17/2017 2213   LABSPEC 1.017 12/17/2017 2213   PHURINE 7.0 12/17/2017 2213   GLUCOSEU NEGATIVE 12/17/2017 2213   [place] 12/17/2017 2213   BILIRUBINUR NEGATIVE 12/17/2017 2213   BILIRUBINUR n 04/30/2016 [place] 12/17/2017 2213   PROTEINUR 30 (A) 12/17/2017 2213   UROBILINOGEN 0.2 04/30/2016 1055   UROBILINOGEN 0.2 01/28/2009 1035   NITRITE NEGATIVE 12/17/2017 2213   LEUKOCYTESUR NEGATIVE 12/17/2017 2213   Sepsis Labs: @LABRCNTIP (procalcitonin:4,lacticidven:4) ) Recent Results (from the past 240 hour(s))  SARS Coronavirus 2 (CEPHEID - Performed in [place] hospital lab), Hosp Order     Status: None   Collection Time: 04/10/19  1:17 AM   Specimen: Nasopharyngeal Swab  Result Value Ref Range Status   SARS Coronavirus 2 NEGATIVE NEGATIVE Final    Comment: (NOTE) If result is NEGATIVE SARS-CoV-2 target nucleic acids are NOT DETECTED. The SARS-CoV-2 RNA is generally detectable in upper and lower  respiratory specimens during the acute phase of infection. The lowest  concentration of SARS-CoV-2 viral copies this assay can detect is 250  copies / mL. A negative  result does not preclude SARS-CoV-2 infection  and should not be used as the sole basis for treatment or other  patient management decisions.  A negative result may occur with  improper specimen collection / handling, submission of specimen other  than nasopharyngeal swab, presence of viral mutation(s) within the  areas targeted by this assay, and inadequate number of viral copies  (<250 copies / mL). A negative result must be combined with clinical  observations, patient history, and epidemiological information. If result is POSITIVE SARS-CoV-2 target nucleic acids are DETECTED. The SARS-CoV-2 RNA is generally detectable in upper and lower  respiratory specimens dur ing the acute phase of infection.  Positive  results are indicative of active infection with SARS-CoV-2.  Clinical  correlation with patient history and other diagnostic information is  necessary to determine patient infection status.  Positive results do  not rule out bacterial infection or co-infection with other viruses. If result is PRESUMPTIVE POSTIVE SARS-CoV-2 nucleic acids MAY BE PRESENT.   A presumptive positive result was obtained on the submitted specimen  and confirmed on repeat testing.  While 2019 novel coronavirus  (SARS-CoV-2) nucleic acids may be present in the submitted sample  additional confirmatory testing may be necessary for epidemiological  and / or clinical management purposes  to differentiate between  SARS-CoV-2 and other Sarbecovirus currently known to infect humans.  If clinically indicated additional testing with an alternate test  methodology [phone]) is advised. The SARS-CoV-2 RNA is generally  detectable in upper and lower respiratory sp ecimens during the acute  phase of infection. The expected result is Negative. Fact Sheet for Patients:  StrictlyIdeas.no Fact Sheet for Healthcare Providers: BankingDealers.co.za This test is not yet  approved or cleared by the [place] FDA and has been authorized for detection and/or diagnosis of SARS-CoV-2 by FDA under an Emergency Use Authorization (EUA).  This EUA will remain in effect (meaning this test can be used) for the duration of the COVID-19 declaration under Section 564(b)(1) of the Act, 21 U.S.C. section 360bbb-3(b)(1), unless the authorization is terminated or revoked sooner. Performed at [facility], [place] [address] [place], [place] [address]      Radiological Exams on Admission: Ct Abdomen Pelvis Wo Contrast  Result Date: 04/09/2019 CLINICAL DATA:  Non-Hodgkin's lymphoma. EXAM: CT CHEST, ABDOMEN AND PELVIS WITHOUT CONTRAST TECHNIQUE:  Multidetector CT imaging of the chest, abdomen and pelvis was performed following the standard protocol without IV contrast. COMPARISON:  Multiple PET-CT scans from 2019 FINDINGS: CT CHEST FINDINGS Cardiovascular: The heart is normal in size. No pericardial effusion. Stable moderate tortuosity, ectasia and calcification of the thoracic aorta. Stable coronary artery calcifications. Mediastinum/Nodes: No mediastinal or hilar mass or lymphadenopathy. The esophagus is grossly normal. There is a moderate to large hiatal hernia. Lungs/Pleura: Stable emphysematous changes and pulmonary scarring. No worrisome pulmonary lesions or pulmonary nodules. No pleural effusion or pleural lesions. Musculoskeletal: Degenerative changes and mild scoliosis and osteoporosis but no worrisome bone lesions. Remote healed sternal fracture. No breast masses, supraclavicular or axillary adenopathy. Extensive carotid artery calcifications are noted. The thyroid gland is grossly normal. The right Port-A-Cath is stable. CT ABDOMEN PELVIS FINDINGS Hepatobiliary: No focal hepatic lesions are identified without contrast. The gallbladder appears normal. No common bile duct dilatation. Pancreas: No mass, inflammation or ductal dilatation. Spleen: Normal size.  No  focal lesions. Adrenals/Urinary Tract: The adrenal glands and kidneys are unremarkable. No renal, ureteral or bladder calculi or obvious mass without contrast. Stomach/Bowel: The stomach, duodenum, small bowel and colon are unremarkable. No acute inflammatory changes, mass lesions or obstructive findings. Vascular/Lymphatic: Stable advanced atherosclerotic calcifications involving the aorta, iliac arteries and branch vessels but no aneurysm. No mesenteric or retroperitoneal mass or adenopathy. Reproductive: The uterus and ovaries are unremarkable for age. Other: No pelvic mass or pelvic lymphadenopathy. No inguinal lymphadenopathy. Musculoskeletal: No significant bony findings. No worrisome bone lesions. Remote healed right pubic rami fractures are noted. Small sclerotic areas in the greater trochanter and right ischium may be related to small bone infarcts. No change. IMPRESSION: 1. No CT findings without contrast to suggest recurrent lymphoma involving the chest, abdomen or pelvis. 2. Stable advanced atherosclerotic calcifications involving the thoracic and abdominal aorta and branch vessels. 3. Moderate to large hiatal hernia. 4. No significant bony findings. Electronically Signed   By: Marijo Sanes M.D.   On: 04/09/2019 11:36   Dg Chest 1 View  Result Date: 04/10/2019 CLINICAL DATA:  Apparent pain status post fall red EXAM: CHEST  1 VIEW COMPARISON:  CT from June 07/01/2019 FINDINGS: Again noted are emphysematous changes bilaterally. There is a well-positioned right-sided Port-A-Cath. 2 there is no pneumothorax. The and the there is scarring at the lung bases. There is no acute osseous abnormality. Heart size is normal. Aortic calcifications are again seen. The no IMPRESSION: No active disease. Electronically Signed   By: Constance Holster M.D.   On: 04/10/2019 01:05   Ct Chest Wo Contrast  Result Date: 04/09/2019 CLINICAL DATA:  Non-Hodgkin's lymphoma. EXAM: CT CHEST, ABDOMEN AND PELVIS WITHOUT  CONTRAST TECHNIQUE: Multidetector CT imaging of the chest, abdomen and pelvis was performed following the standard protocol without IV contrast. COMPARISON:  Multiple PET-CT scans from 2019 FINDINGS: CT CHEST FINDINGS Cardiovascular: The heart is normal in size. No pericardial effusion. Stable moderate tortuosity, ectasia and calcification of the thoracic aorta. Stable coronary artery calcifications. Mediastinum/Nodes: No mediastinal or hilar mass or lymphadenopathy. The esophagus is grossly normal. There is a moderate to large hiatal hernia. Lungs/Pleura: Stable emphysematous changes and pulmonary scarring. No worrisome pulmonary lesions or pulmonary nodules. No pleural effusion or pleural lesions. Musculoskeletal: Degenerative changes and mild scoliosis and osteoporosis but no worrisome bone lesions. Remote healed sternal fracture. No breast masses, supraclavicular or axillary adenopathy. Extensive carotid artery calcifications are noted. The thyroid gland is grossly normal. The right Port-A-Cath is stable. CT ABDOMEN  PELVIS FINDINGS Hepatobiliary: No focal hepatic lesions are identified without contrast. The gallbladder appears normal. No common bile duct dilatation. Pancreas: No mass, inflammation or ductal dilatation. Spleen: Normal size.  No focal lesions. Adrenals/Urinary Tract: The adrenal glands and kidneys are unremarkable. No renal, ureteral or bladder calculi or obvious mass without contrast. Stomach/Bowel: The stomach, duodenum, small bowel and colon are unremarkable. No acute inflammatory changes, mass lesions or obstructive findings. Vascular/Lymphatic: Stable advanced atherosclerotic calcifications involving the aorta, iliac arteries and branch vessels but no aneurysm. No mesenteric or retroperitoneal mass or adenopathy. Reproductive: The uterus and ovaries are unremarkable for age. Other: No pelvic mass or pelvic lymphadenopathy. No inguinal lymphadenopathy. Musculoskeletal: No significant bony  findings. No worrisome bone lesions. Remote healed right pubic rami fractures are noted. Small sclerotic areas in the greater trochanter and right ischium may be related to small bone infarcts. No change. IMPRESSION: 1. No CT findings without contrast to suggest recurrent lymphoma involving the chest, abdomen or pelvis. 2. Stable advanced atherosclerotic calcifications involving the thoracic and abdominal aorta and branch vessels. 3. Moderate to large hiatal hernia. 4. No significant bony findings. Electronically Signed   By: Marijo Sanes M.D.   On: 04/09/2019 11:36   Dg Hip Unilat With Pelvis 2-3 Views Left  Result Date: 04/10/2019 CLINICAL DATA:  Fall EXAM: DG HIP (WITH OR WITHOUT PELVIS) 2-3V LEFT COMPARISON:  None. FINDINGS: There is an acute impacted transcervical fracture of the proximal left femur. There are stable sclerotic lesions in the right ischium and right femur. A large amount of stool throughout the colon is noted. IMPRESSION: Acute impacted transcervical fracture of the proximal left femur Electronically Signed   By: Constance Holster M.D.   On: 04/10/2019 01:04    EKG: Independently reviewed.  Normal sinus rhythm.  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 Left hip fracture status post mechanical fall -on-call orthopedic surgeon has been notified.  Dr. Doreatha Martin is planning to do the surgery.  Patient will be kept n.p.o. in anticipation of surgery.  Pain relief medications.  Patient is at moderate risk for intermediate risk procedure. 2. COPD not actively wheezing continue inhalers. 3. History of lymphoma in remission on Revlimid.  Given the complexity of the case and continuous care and pain relief and surgery patient will need inpatient status.   DVT prophylaxis: SCDs. Code Status: Full code.  This is a reversal from previous [place]. Family  Communication: Discussed with patient. Disposition Plan: We will need rehab. Consults called: Orthopedics. Admission status: Inpatient.   Rise Patience MD [facility] Pager [phone].  If 7PM-7AM, please contact night-coverage [email or web address] Password [email or web address]  04/10/2019, 4:36 AM

## 2019-04-10 NOTE — Transfer of Care (Signed)
Immediate Anesthesia Transfer of Care Note  Patient: Brooke Washington  Procedure(s) Performed: TOTAL HIP ARTHROPLASTY ANTERIOR APPROACH LEFT HIP (Left Hip)  Patient Location: PACU  Anesthesia Type:General  Level of Consciousness: drowsy  Airway & Oxygen Therapy: Patient Spontanous Breathing and Patient connected to face mask oxygen  Post-op Assessment: Report given to RN and Post -op Vital signs reviewed and stable  Post vital signs: Reviewed and stable  Last Vitals:  Vitals Value Taken Time  BP 109/47 04/10/19 2023  Temp    Pulse 127 04/10/19 2028  Resp 16 04/10/19 2028  SpO2 94 % 04/10/19 2028  Vitals shown include unvalidated device data.  Last Pain:  Vitals:   04/10/19 1614  TempSrc:   PainSc: 0-No pain      Patients Stated Pain Goal: 3 (93/71/69 [id number])  Complications: No apparent anesthesia complications

## 2019-04-10 NOTE — Consult Note (Signed)
Reason for Consult:Left hip fx Referring Physician: A Adhikari  Brooke Washington is an 78 y.o. female.  HPI: Brooke Washington was backing out of her pantry and tripped and fell backwards landing on her left hip. She had immediate pain and could not get up. She was brought to the ED where x-rays showed a left femoral neck fx and orthopedic surgery was consulted. She c/o localized pain to the area only. She walks without an assistive device and is walking a mile or two a day for exercise. She lives at home with her husband.  Past Medical History:  Diagnosis Date  . Cancer (Brooke Washington)  APRIL OF 2000   LOW-GRADE NON-HODGKIN'S LYMPHOMA  . COPD (chronic obstructive pulmonary disease) ([place])   . Emphysema of lung ([place])   . Hyperlipidemia     Past Surgical History:  Procedure Laterality Date  . 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 IR FLUORO GUIDE PORT INSERTION RIGHT  12/07/2017  . IR US GUIDE VASC ACCESS RIGHT  12/07/2017  . ORIF HUMERUS FRACTURE Right 11/23/2017   Procedure: OPEN REDUCTION INTERNAL FIXATION (ORIF) HUMERAL SHAFT FRACTURE;  Surgeon: Shona Needles, MD;  Location: [facility];  Service: Orthopedics;  Laterality: Right;  . RIGHT OOPHORECTOMY  1976    Family History  Problem Relation Age of Onset  . Cancer Mother        colon ca    Social History:  reports that she quit smoking about 4 years ago. Her smoking use included cigarettes. She has a 12.75 pack-year smoking history. She has never used smokeless tobacco. She reports current alcohol use. She reports that she does not use drugs.  Allergies:  Allergies  Allergen Reactions  . Contrast Media [Iodinated Diagnostic Agents] Shortness Of Breath, Rash and Other (See Comments)    Throat swells  . Iohexol Shortness Of Breath  . Povidone-Iodine Hives, Shortness Of Breath and Rash    REACTION: throat swells  . Penicillins Hives, Itching and Rash    Has patient had a PCN  reaction causing immediate rash, facial/tongue/throat swelling, SOB or lightheadedness with hypotension: Unknown Has patient had a PCN reaction causing severe rash involving mucus membranes or skin necrosis: Yes Has patient had a PCN reaction that required hospitalization: Unknown Has patient had a PCN reaction occurring within the last 10 years: Unknown If all of the above answers are "NO", then may proceed with Cephalosporin use.   . Adhesive [Tape] Hives    Medications: I have reviewed the patient's current medications.  Results for orders placed or performed during the hospital encounter of 04/09/19 (from the past 48 hour(s))  Basic metabolic panel     Status: Abnormal   Collection Time: 04/09/19 11:39 PM  Result Value Ref Range   Sodium 141 135 - 145 mmol/L   Potassium 3.6 3.5 - 5.1 mmol/L   Chloride 106 98 - 111 mmol/L   CO2 24 22 - 32 mmol/L   Glucose, Bld 108 (H) 70 - 99 mg/dL   BUN 12 8 - 23 mg/dL   Creatinine, Ser 0.62 0.44 - 1.00 mg/dL   Calcium 8.5 (L) 8.9 - 10.3 mg/dL   GFR calc non Af Amer >60 >60 mL/min   GFR calc Af Amer >60 >60 mL/min   Anion gap 11 5 - 15    Comment: Performed at [facility], [place] [address], [place], [place] [address]  CBC WITH DIFFERENTIAL  Status: Abnormal   Collection Time: 04/09/19 11:39 PM  Result Value Ref Range   WBC 5.8 4.0 - 10.5 K/uL   RBC 3.71 (L) 3.87 - 5.11 MIL/uL   Hemoglobin 12.9 12.0 - 15.0 g/dL   HCT 38.3 36.0 - 46.0 %   MCV 103.2 (H) 80.0 - 100.0 fL   MCH 34.8 (H) 26.0 - 34.0 pg   MCHC 33.7 30.0 - 36.0 g/dL   RDW 13.4 11.5 - 15.5 %   Platelets 203 150 - 400 K/uL   nRBC 0.0 0.0 - 0.2 %   Neutrophils Relative % 62 %   Neutro Abs 3.6 1.7 - 7.7 K/uL   Lymphocytes Relative 15 %   Lymphs Abs 0.8 0.7 - 4.0 K/uL   Monocytes Relative 16 %   Monocytes Absolute 0.9 0.1 - 1.0 K/uL   Eosinophils Relative 6 %   Eosinophils Absolute 0.4 0.0 - 0.5 K/uL   Basophils Relative 1 %   Basophils Absolute 0.0 0.0 -  0.1 K/uL   Immature Granulocytes 0 %   Abs Immature Granulocytes 0.02 0.00 - 0.07 K/uL    Comment: Performed at [facility], [place] [address] [place], [place] [address]  Protime-INR     Status: None   Collection Time: 04/09/19 11:39 PM  Result Value Ref Range   Prothrombin Time 12.0 11.4 - 15.2 seconds   INR 0.9 0.8 - 1.2    Comment: (NOTE) INR goal varies based on device and disease states. Performed at [facility], [place] [address], [place], [place] [address]   Type and screen [place]     Status: None   Collection Time: 04/09/19 11:39 PM  Result Value Ref Range   ABO/RH(D) O POS    Antibody Screen NEG    Sample Expiration      04/12/2019,2359 Performed at [facility], [place] [address] [place], [place] [address]   ABO/Rh     Status: None   Collection Time: 04/10/19 12:01 AM  Result Value Ref Range   ABO/RH(D)      O POS Performed at [facility], [place] [address] [place], [place] [address]   SARS Coronavirus 2 (CEPHEID - Performed in [place] hospital lab), Hosp Order     Status: None   Collection Time: 04/10/19  1:17 AM   Specimen: Nasopharyngeal Swab  Result Value Ref Range   SARS Coronavirus 2 NEGATIVE NEGATIVE    Comment: (NOTE) If result is NEGATIVE SARS-CoV-2 target nucleic acids are NOT DETECTED. The SARS-CoV-2 RNA is generally detectable in upper and lower  respiratory specimens during the acute phase of infection. The lowest  concentration of SARS-CoV-2 viral copies this assay can detect is 250  copies / mL. A negative result does not preclude SARS-CoV-2 infection  and should not be used as the sole basis for treatment or other  patient management decisions.  A negative result may occur with  improper specimen collection / handling, submission of specimen other  than nasopharyngeal swab, presence of viral mutation(s) within the  areas targeted by this assay, and  inadequate number of viral copies  (<250 copies / mL). A negative result must be combined with clinical  observations, patient history, and epidemiological information. If result is POSITIVE SARS-CoV-2 target nucleic acids are DETECTED. The SARS-CoV-2 RNA is generally detectable in upper and lower  respiratory specimens dur ing the acute phase of infection.  Positive  results are indicative of active infection  with SARS-CoV-2.  Clinical  correlation with patient history and other diagnostic information is  necessary to determine patient infection status.  Positive results do  not rule out bacterial infection or co-infection with other viruses. If result is PRESUMPTIVE POSTIVE SARS-CoV-2 nucleic acids MAY BE PRESENT.   A presumptive positive result was obtained on the submitted specimen  and confirmed on repeat testing.  While 2019 novel coronavirus  (SARS-CoV-2) nucleic acids may be present in the submitted sample  additional confirmatory testing may be necessary for epidemiological  and / or clinical management purposes  to differentiate between  SARS-CoV-2 and other Sarbecovirus currently known to infect humans.  If clinically indicated additional testing with an alternate test  methodology [phone]) is advised. The SARS-CoV-2 RNA is generally  detectable in upper and lower respiratory sp ecimens during the acute  phase of infection. The expected result is Negative. Fact Sheet for Patients:  StrictlyIdeas.no Fact Sheet for Healthcare Providers: BankingDealers.co.za This test is not yet approved or cleared by the [place] FDA and has been authorized for detection and/or diagnosis of SARS-CoV-2 by FDA under an Emergency Use Authorization (EUA).  This EUA will remain in effect (meaning this test can be used) for the duration of the COVID-19 declaration under Section 564(b)(1) of the Act, 21 U.S.C. section 360bbb-3(b)(1), unless the  authorization is terminated or revoked sooner. Performed at [facility], [place] [address], [place], [place] [address]   Comprehensive metabolic panel     Status: Abnormal   Collection Time: 04/10/19  6:20 AM  Result Value Ref Range   Sodium 138 135 - 145 mmol/L   Potassium 3.8 3.5 - 5.1 mmol/L   Chloride 102 98 - 111 mmol/L   CO2 27 22 - 32 mmol/L   Glucose, Bld 124 (H) 70 - 99 mg/dL   BUN 10 8 - 23 mg/dL   Creatinine, Ser 0.82 0.44 - 1.00 mg/dL   Calcium 8.6 (L) 8.9 - 10.3 mg/dL   Total Protein 5.7 (L) 6.5 - 8.1 g/dL   Albumin 3.9 3.5 - 5.0 g/dL   AST 24 15 - 41 U/L   ALT 16 0 - 44 U/L   Alkaline Phosphatase 105 38 - 126 U/L   Total Bilirubin 1.1 0.3 - 1.2 mg/dL   GFR calc non Af Amer >60 >60 mL/min   GFR calc Af Amer >60 >60 mL/min   Anion gap 9 5 - 15    Comment: Performed at [facility] [address], [place], [place] [address]  CBC WITH DIFFERENTIAL     Status: Abnormal   Collection Time: 04/10/19  6:20 AM  Result Value Ref Range   WBC 8.2 4.0 - 10.5 K/uL   RBC 3.79 (L) 3.87 - 5.11 MIL/uL   Hemoglobin 13.0 12.0 - 15.0 g/dL   HCT 38.4 36.0 - 46.0 %   MCV 101.3 (H) 80.0 - 100.0 fL   MCH 34.3 (H) 26.0 - 34.0 pg   MCHC 33.9 30.0 - 36.0 g/dL   RDW 13.3 11.5 - 15.5 %   Platelets 207 150 - 400 K/uL   nRBC 0.0 0.0 - 0.2 %   Neutrophils Relative % 72 %   Neutro Abs 5.9 1.7 - 7.7 K/uL   Lymphocytes Relative 9 %   Lymphs Abs 0.7 0.7 - 4.0 K/uL   Monocytes Relative 17 %   Monocytes Absolute 1.4 (H) 0.1 - 1.0 K/uL   Eosinophils Relative 2 %   Eosinophils Absolute 0.2 0.0 -  0.5 K/uL   Basophils Relative 0 %   Basophils Absolute 0.0 0.0 - 0.1 K/uL   Immature Granulocytes 0 %   Abs Immature Granulocytes 0.02 0.00 - 0.07 K/uL    Comment: Performed at [facility], [place] [address] [place], [place] [address]  Glucose, capillary     Status: Abnormal   Collection Time: 04/10/19  6:25 AM  Result Value Ref Range   Glucose-Capillary 116 (H) 70 - 99  mg/dL    Ct Abdomen Pelvis Wo Contrast  Result Date: 04/09/2019 CLINICAL DATA:  Non-Hodgkin's lymphoma. EXAM: CT CHEST, ABDOMEN AND PELVIS WITHOUT CONTRAST TECHNIQUE: Multidetector CT imaging of the chest, abdomen and pelvis was performed following the standard protocol without IV contrast. COMPARISON:  Multiple PET-CT scans from 2019 FINDINGS: CT CHEST FINDINGS Cardiovascular: The heart is normal in size. No pericardial effusion. Stable moderate tortuosity, ectasia and calcification of the thoracic aorta. Stable coronary artery calcifications. Mediastinum/Nodes: No mediastinal or hilar mass or lymphadenopathy. The esophagus is grossly normal. There is a moderate to large hiatal hernia. Lungs/Pleura: Stable emphysematous changes and pulmonary scarring. No worrisome pulmonary lesions or pulmonary nodules. No pleural effusion or pleural lesions. Musculoskeletal: Degenerative changes and mild scoliosis and osteoporosis but no worrisome bone lesions. Remote healed sternal fracture. No breast masses, supraclavicular or axillary adenopathy. Extensive carotid artery calcifications are noted. The thyroid gland is grossly normal. The right Port-A-Cath is stable. CT ABDOMEN PELVIS FINDINGS Hepatobiliary: No focal hepatic lesions are identified without contrast. The gallbladder appears normal. No common bile duct dilatation. Pancreas: No mass, inflammation or ductal dilatation. Spleen: Normal size.  No focal lesions. Adrenals/Urinary Tract: The adrenal glands and kidneys are unremarkable. No renal, ureteral or bladder calculi or obvious mass without contrast. Stomach/Bowel: The stomach, duodenum, small bowel and colon are unremarkable. No acute inflammatory changes, mass lesions or obstructive findings. Vascular/Lymphatic: Stable advanced atherosclerotic calcifications involving the aorta, iliac arteries and branch vessels but no aneurysm. No mesenteric or retroperitoneal mass or adenopathy. Reproductive: The uterus and  ovaries are unremarkable for age. Other: No pelvic mass or pelvic lymphadenopathy. No inguinal lymphadenopathy. Musculoskeletal: No significant bony findings. No worrisome bone lesions. Remote healed right pubic rami fractures are noted. Small sclerotic areas in the greater trochanter and right ischium may be related to small bone infarcts. No change. IMPRESSION: 1. No CT findings without contrast to suggest recurrent lymphoma involving the chest, abdomen or pelvis. 2. Stable advanced atherosclerotic calcifications involving the thoracic and abdominal aorta and branch vessels. 3. Moderate to large hiatal hernia. 4. No significant bony findings. Electronically Signed   By: Marijo Sanes M.D.   On: 04/09/2019 11:36   Dg Chest 1 View  Result Date: 04/10/2019 CLINICAL DATA:  Apparent pain status post fall red EXAM: CHEST  1 VIEW COMPARISON:  CT from June 07/01/2019 FINDINGS: Again noted are emphysematous changes bilaterally. There is a well-positioned right-sided Port-A-Cath. 2 there is no pneumothorax. The and the there is scarring at the lung bases. There is no acute osseous abnormality. Heart size is normal. Aortic calcifications are again seen. The no IMPRESSION: No active disease. Electronically Signed   By: Constance Holster M.D.   On: 04/10/2019 01:05   Ct Chest Wo Contrast  Result Date: 04/09/2019 CLINICAL DATA:  Non-Hodgkin's lymphoma. EXAM: CT CHEST, ABDOMEN AND PELVIS WITHOUT CONTRAST TECHNIQUE: Multidetector CT imaging of the chest, abdomen and pelvis was performed following the standard protocol without IV contrast. COMPARISON:  Multiple PET-CT scans from 2019 FINDINGS: CT CHEST FINDINGS Cardiovascular:  The heart is normal in size. No pericardial effusion. Stable moderate tortuosity, ectasia and calcification of the thoracic aorta. Stable coronary artery calcifications. Mediastinum/Nodes: No mediastinal or hilar mass or lymphadenopathy. The esophagus is grossly normal. There is a moderate to large  hiatal hernia. Lungs/Pleura: Stable emphysematous changes and pulmonary scarring. No worrisome pulmonary lesions or pulmonary nodules. No pleural effusion or pleural lesions. Musculoskeletal: Degenerative changes and mild scoliosis and osteoporosis but no worrisome bone lesions. Remote healed sternal fracture. No breast masses, supraclavicular or axillary adenopathy. Extensive carotid artery calcifications are noted. The thyroid gland is grossly normal. The right Port-A-Cath is stable. CT ABDOMEN PELVIS FINDINGS Hepatobiliary: No focal hepatic lesions are identified without contrast. The gallbladder appears normal. No common bile duct dilatation. Pancreas: No mass, inflammation or ductal dilatation. Spleen: Normal size.  No focal lesions. Adrenals/Urinary Tract: The adrenal glands and kidneys are unremarkable. No renal, ureteral or bladder calculi or obvious mass without contrast. Stomach/Bowel: The stomach, duodenum, small bowel and colon are unremarkable. No acute inflammatory changes, mass lesions or obstructive findings. Vascular/Lymphatic: Stable advanced atherosclerotic calcifications involving the aorta, iliac arteries and branch vessels but no aneurysm. No mesenteric or retroperitoneal mass or adenopathy. Reproductive: The uterus and ovaries are unremarkable for age. Other: No pelvic mass or pelvic lymphadenopathy. No inguinal lymphadenopathy. Musculoskeletal: No significant bony findings. No worrisome bone lesions. Remote healed right pubic rami fractures are noted. Small sclerotic areas in the greater trochanter and right ischium may be related to small bone infarcts. No change. IMPRESSION: 1. No CT findings without contrast to suggest recurrent lymphoma involving the chest, abdomen or pelvis. 2. Stable advanced atherosclerotic calcifications involving the thoracic and abdominal aorta and branch vessels. 3. Moderate to large hiatal hernia. 4. No significant bony findings. Electronically Signed   By: Marijo Sanes M.D.   On: 04/09/2019 11:36   Dg Hip Unilat With Pelvis 2-3 Views Left  Result Date: 04/10/2019 CLINICAL DATA:  Fall EXAM: DG HIP (WITH OR WITHOUT PELVIS) 2-3V LEFT COMPARISON:  None. FINDINGS: There is an acute impacted transcervical fracture of the proximal left femur. There are stable sclerotic lesions in the right ischium and right femur. A large amount of stool throughout the colon is noted. IMPRESSION: Acute impacted transcervical fracture of the proximal left femur Electronically Signed   By: Constance Holster M.D.   On: 04/10/2019 01:04    Review of Systems  Constitutional: Negative for weight loss.  HENT: Negative for ear discharge, ear pain, hearing loss and tinnitus.   Eyes: Negative for blurred vision, double vision, photophobia and pain.  Respiratory: Negative for cough, sputum production and shortness of breath.   Cardiovascular: Negative for chest pain.  Gastrointestinal: Negative for abdominal pain, nausea and vomiting.  Genitourinary: Negative for dysuria, flank pain, frequency and urgency.  Musculoskeletal: Positive for joint pain (Left hip). Negative for back pain, falls, myalgias and neck pain.  Neurological: Negative for dizziness, tingling, sensory change, focal weakness, loss of consciousness and headaches.  Endo/Heme/Allergies: Does not bruise/bleed easily.  Psychiatric/Behavioral: Negative for depression, memory loss and substance abuse. The patient is not nervous/anxious.    Blood pressure 119/69, pulse 87, temperature 98.7 F (37.1 C), temperature source Oral, resp. rate 16, height 5\' 4"  (1.626 m), weight 52.2 kg, SpO2 94 %. Physical Exam  Constitutional: She appears well-developed and well-nourished. No distress.  HENT:  Head: Normocephalic and atraumatic.  Eyes: Conjunctivae are normal. Right eye exhibits no discharge. Left eye exhibits no discharge. No scleral icterus.  Neck: Normal  range of motion.  Cardiovascular: Normal rate and regular rhythm.   Respiratory: Effort normal. No respiratory distress.  Musculoskeletal:     Comments: LLE No traumatic wounds, ecchymosis, or rash  Hip mild TTP  No knee or ankle effusion  Knee stable to varus/ valgus and anterior/posterior stress  Sens DPN, SPN, TN intact  Motor EHL, ext, flex, evers 5/5  DP 1+, PT 1+, No significant edema  Neurological: She is alert.  Skin: Skin is warm and dry. She is not diaphoretic.  Psychiatric: She has a normal mood and affect. Her behavior is normal.    Assessment/Plan: Left femoral neck fx -- Given her activity level and age she should probably receive a THA. Will plan on that this afternoon with Dr. Lyla Glassing. Please keep NPO. Lymphoma -- No signs of recurrance per oncology note in May [facility] [place], PA-C Orthopedic Surgery [phone] 04/10/2019, 9:16 AM

## 2019-04-10 NOTE — Op Note (Signed)
OPERATIVE REPORT  SURGEON: Rod Can, MD   ASSISTANT: Staff.  PREOPERATIVE DIAGNOSIS: Displaced Left femoral neck fracture.   POSTOPERATIVE DIAGNOSIS: Displaced Left femoral neck fracture.  PROCEDURE: Left total hip arthroplasty, anterior approach.   IMPLANTS: DePuy Tri Lock stem, size 6, std offset. DePuy Pinnacle Cup, size 50 mm. DePuy Altrx liner, size 32 by 50 mm, neutral. DePuy metal head ball, size 32 + 1 mm. Dall Miles 2.0 mm adult reconstruction cable.  ANESTHESIA:  General  ESTIMATED BLOOD LOSS:-300 mL    ANTIBIOTICS: 1 g Vancomycin (PCN allergy).  DRAINS: None.  COMPLICATIONS: None.   CONDITION: PACU - hemodynamically stable.   BRIEF CLINICAL NOTE: Brooke Washington is a 78 y.o. female with a displaced Left femoral neck fracture. The patient was admitted to the hospitalist service and underwent perioperative risk stratification and medical optimization. The risks, benefits, and alternatives to hemiarthroplasty were explained, and the patient elected to proceed.  PROCEDURE IN DETAIL: The patient was taken to the operating room and general anesthesia was induced on the hospital bed. The patient was then positioned on the [place] table. All bony prominences were well padded. The hip was prepped and draped in the normal sterile surgical fashion. A time-out was called verifying side and site of surgery. Antibiotics were given within 60 minutes of beginning the procedure.  The direct anterior approach to the hip was performed through the Hueter interval. Lateral femoral circumflex vessels were treated with the Auqumantys. The anterior capsule was exposed and an inverted T capsulotomy was made. Fracture hematoma was encountered and evacuated. The patient was found to have a comminuted Left transcervical femoral neck fracture. I freshened the femoral neck cut with a saw. I removed the femoral neck fragment. A corkscrew was placed into the head and the head was  removed. This was passed to the back table and was measured. A V shaped defect was noted in the posteromedial calcar.  Acetabular exposure was achieved, and the pulvinar and labrum were excised. Sequential reaming of the acetabulum was then performed up to a size 49 mm reamer. A 50 mm cup was then opened and impacted into place at approximately 40 degrees of abduction and 20 degrees of anteversion. The final polyethylene liner was impacted into place and acetabular osteophytes were removed.   I then gained femoral exposure taking care to protect the abductors and greater trochanter. This was performed using standard external rotation, extension, and adduction. The capsule was peeled off the inner aspect of the greater trochanter, taking care to preserve the short external rotators. A single prophylactic adult reconstruction cable was placed subperiosteally just proximal to the lesser trochanter. A cookie cutter was used to enter the femoral canal, and then the femoral canal finder was placed. Sequential broaching was performed up to a size 6. Calcar planer was used on the femoral neck remnant. I placed a std offset neck and a trial head ball. The hip was reduced. Leg lengths and offset were checked fluoroscopically. The hip was dislocated and trial components were removed. The final implants were placed, and the hip was reduced.  Fluoroscopy was used to confirm component position and leg lengths. At 90 degrees of external rotation and full extension, the hip was stable to an anterior directed force.  The wound was copiously irrigated with normal saline using pulse lavage. Marcaine solution was injected into the periarticular soft tissue. The wound was closed in layers using #1 Vicryl and V-Loc for the fascia, 2-0 Vicryl for the subcutaneous fat,  2-0 Monocryl for the deep dermal layer, 3-0 running Monocryl subcuticular stitch, and Dermabond for the skin. Once the glue was fully dried, an  Aquacell Ag dressing was applied. The patient was transported to the recovery room in stable condition. Sponge, needle, and instrument counts were correct at the end of the case x2. The patient tolerated the procedure well and there were no known complications.

## 2019-04-10 NOTE — Interval H&P Note (Signed)
History and Physical Interval Note:  04/10/2019 5:15 PM  Gilford Silvius  has presented today for surgery, with the diagnosis of Left femoral fracture.  The various methods of treatment have been discussed with the patient and family. After consideration of risks, benefits and other options for treatment, the patient has consented to  Procedure(s): TOTAL HIP ARTHROPLASTY ANTERIOR APPROACH (Left) as a surgical intervention.  The patient's history has been reviewed, patient examined, no change in status, stable for surgery.  I have reviewed the patient's chart and labs.  Questions were answered to the patient's satisfaction.    The risks, benefits, and alternatives were discussed with the patient. There are risks associated with the surgery including, but not limited to, problems with anesthesia (death), infection, instability (giving out of the joint), dislocation, differences in leg length/angulation/rotation, fracture of bones, loosening or failure of implants, hematoma (blood accumulation) which may require surgical drainage, blood clots, pulmonary embolism, nerve injury (foot drop and lateral thigh numbness), and blood vessel injury. The patient understands these risks and elects to proceed.   Hilton Cork Wayde Gopaul

## 2019-04-11 ENCOUNTER — Encounter (HOSPITAL_COMMUNITY): Payer: Self-pay | Admitting: Orthopedic Surgery

## 2019-04-11 LAB — CBC WITH DIFFERENTIAL/PLATELET
Abs Immature Granulocytes: 0.02 10*3/uL (ref 0.00–0.07)
Basophils Absolute: 0 10*3/uL (ref 0.0–0.1)
Basophils Relative: 0 %
Eosinophils Absolute: 0 10*3/uL (ref 0.0–0.5)
Eosinophils Relative: 0 %
HCT: 25.4 % — ABNORMAL LOW (ref 36.0–46.0)
Hemoglobin: 8.4 g/dL — ABNORMAL LOW (ref 12.0–15.0)
Immature Granulocytes: 0 %
Lymphocytes Relative: 5 %
Lymphs Abs: 0.3 10*3/uL — ABNORMAL LOW (ref 0.7–4.0)
MCH: 34.4 pg — ABNORMAL HIGH (ref 26.0–34.0)
MCHC: 33.1 g/dL (ref 30.0–36.0)
MCV: 104.1 fL — ABNORMAL HIGH (ref 80.0–100.0)
Monocytes Absolute: 0.7 10*3/uL (ref 0.1–1.0)
Monocytes Relative: 13 %
Neutro Abs: 4.1 10*3/uL (ref 1.7–7.7)
Neutrophils Relative %: 82 %
Platelets: 143 10*3/uL — ABNORMAL LOW (ref 150–400)
RBC: 2.44 MIL/uL — ABNORMAL LOW (ref 3.87–5.11)
RDW: 13.6 % (ref 11.5–15.5)
WBC: 5 10*3/uL (ref 4.0–10.5)
nRBC: 0 % (ref 0.0–0.2)

## 2019-04-11 LAB — BASIC METABOLIC PANEL
Anion gap: 8 (ref 5–15)
BUN: 14 mg/dL (ref 8–23)
CO2: 29 mmol/L (ref 22–32)
Calcium: 8.7 mg/dL — ABNORMAL LOW (ref 8.9–10.3)
Chloride: 102 mmol/L (ref 98–111)
Creatinine, Ser: 0.98 mg/dL (ref 0.44–1.00)
GFR calc Af Amer: >60 mL/min (ref >60)
GFR calc non Af Amer: 55 mL/min — ABNORMAL LOW (ref >60)
Glucose, Bld: 160 mg/dL — ABNORMAL HIGH (ref 70–99)
Potassium: 4.1 mmol/L (ref 3.5–5.1)
Sodium: 139 mmol/L (ref 135–145)

## 2019-04-11 LAB — GLUCOSE, CAPILLARY
Glucose-Capillary: 134 mg/dL — ABNORMAL HIGH (ref 70–99)
Glucose-Capillary: 154 mg/dL — ABNORMAL HIGH (ref 70–99)
Glucose-Capillary: 96 mg/dL (ref 70–99)

## 2019-04-11 MED ORDER — ASPIRIN 81 MG PO CHEW: 81.0000 mg | CHEWABLE_TABLET | Freq: Two times a day (BID) | ORAL | 1 refills | Status: DC

## 2019-04-11 MED ORDER — HYDROCODONE-ACETAMINOPHEN 5-325 MG PO TABS: 1.0000 | ORAL_TABLET | ORAL | 0 refills | Status: DC | PRN

## 2019-04-11 NOTE — Social Work (Signed)
CSW acknowledging consult for SNF placement. Will follow for therapy recommendations.   Elad Macphail, MSW, LCSWA [place] [facility] Social Work [phone]   

## 2019-04-11 NOTE — Progress Notes (Addendum)
PROGRESS NOTE    Brooke Washington  JKK:[id number] DOB: [birth year] DOA: 04/09/2019 PCP: [name], NP   Brief Narrative:  Patient is a 78 year old female with history of lymphoma on Revlimid, COPD who presents from home after a mechanical fall.  X-ray done in the emergency department showed left transcervical proximal femur fracture. She is usually ambulatory at home and ambulates without the help of walker or cane.  She lives with her husband. Orthopedics consulted and she underwent left total hip arthroplasty.  Last night she became hypotensive and hypoxic after surgery.  Currently hemodynamically stable.  Physical therapy recommended her to go to CIR on discharge.  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  Underwent left total hip arthroplasty.  Orthopedics following.  On Lovenox for DVT prophylaxis.  Continue pain  management. PT/OT evaluated her and recommended CIR.  CIR consulted  Hypoxia/hypotension: She became hypoxic and hypotensive.  Currently blood pressure stable.  She is on supplemental oxygen.  We will try to wean the oxygen to off.  COPD: Currently stable.  On inhalers at home.  Continue bronchodilators as needed.  History of lymphoma: Follows with oncology.  On Revlimid.  Nutrition Problem: Increased nutrient needs Etiology: post-op healing      DVT prophylaxis: Lovenox Code Status: Full Family Communication: Called spouse on the phone twice,not received Disposition Plan: CIR when bed is available   Consultants: Orthopedics  Procedures: Hip arthroplasty  Antimicrobials:  Anti-infectives (From admission, onward)   Start     Dose/Rate Route Frequency Ordered Stop   04/11/19 0600  vancomycin (VANCOCIN) IVPB 1000 mg/200 mL premix     1,000 mg 200 mL/hr over 60 Minutes  Intravenous On call to O.R. 04/10/19 1556 04/11/19 0552   04/11/19 0400  vancomycin (VANCOCIN) IVPB 1000 mg/200 mL premix     1,000 mg 200 mL/hr over 60 Minutes Intravenous Every 12 hours 04/10/19 2249 04/11/19 0508      Subjective:  Patient seen and examined at bedside this afternoon.  Currently comfortable.  Hemodynamically stable.  Pain well controlled.  Objective: Vitals:   04/11/19 0550 04/11/19 0600 04/11/19 0800 04/11/19 1155  BP:  (!) 94/36 (!) 99/41 (!) 108/56  Pulse: 90 85 79 96  Resp: 18 (!) 22 15 (!) 21  Temp:   98.2 F (36.8 C) 98.1 F (36.7 C)  TempSrc:   Oral Oral  SpO2: (!) 81% 97% 90% 91%  Weight:      Height:        Intake/Output Summary (Last 24 hours) at 04/11/2019 1346 Last data filed at 04/11/2019 0600 Gross per 24 hour  Intake 1716.33 ml  Output 950 ml  Net 766.33 ml   Filed Weights   04/09/19 2326 04/10/19 1614  Weight: 52.2 kg 52.2 kg    Examination:  General exam: Appears calm and comfortable ,Not in distress,average built Respiratory system: Bilateral equal air entry, normal vesicular breath sounds, no wheezes or crackles  Cardiovascular system: S1 & S2 heard, RRR. No JVD, murmurs, rubs, gallops or clicks. No pedal edema. Gastrointestinal system: Abdomen is nondistended, soft and nontender. No organomegaly or masses felt. Normal bowel sounds heard. Central nervous system: Alert and oriented. No focal neurological deficits. Extremities: Clean surgical wound on the left hip, mild edema of the left thigh, tenderness of the left  thigh Skin: No rashes, lesions or ulcers,no icterus ,no pallor  Data Reviewed: I have personally reviewed following labs and imaging studies  CBC: Recent Labs  Lab 04/09/19 0905 04/09/19 2339 04/10/19 0620 04/11/19 0437  WBC 5.3 5.8 8.2 5.0  NEUTROABS 2.5 3.6 5.9 4.1  HGB 13.2 12.9 13.0 8.4*  HCT 39.4 38.3 38.4 25.4*  MCV 101.0* 103.2* 101.3* 104.1*  PLT 218 203 207 468*   Basic Metabolic Panel: Recent Labs   Lab 04/09/19 0905 04/09/19 2339 04/10/19 0620 04/11/19 0437  NA 142 141 138 139  K 3.7 3.6 3.8 4.1  CL 103 106 102 102  CO2 28 24 27 29   GLUCOSE 82 108* 124* 160*  BUN 10 12 10 14   CREATININE 0.75 0.62 0.82 0.98  CALCIUM 9.2 8.5* 8.6* 8.7*   GFR: Estimated Creatinine Clearance: 39 mL/min (by C-G formula based on SCr of 0.98 mg/dL). Liver Function Tests: Recent Labs  Lab 04/09/19 0905 04/10/19 0620  AST 20 24  ALT 12 16  ALKPHOS 124 105  BILITOT 0.6 1.1  PROT 6.1* 5.7*  ALBUMIN 4.0 3.9   No results for input(s): LIPASE, AMYLASE in the last 168 hours. No results for input(s): AMMONIA in the last 168 hours. Coagulation Profile: Recent Labs  Lab 04/09/19 2339  INR 0.9   Cardiac Enzymes: No results for input(s): CKTOTAL, CKMB, CKMBINDEX, TROPONINI in the last 168 hours. BNP (last 3 results) No results for input(s): PROBNP in the last 8760 hours. HbA1C: No results for input(s): HGBA1C in the last 72 hours. CBG: Recent Labs  Lab 04/10/19 0625 04/10/19 1151 04/11/19 0054 04/11/19 1156  GLUCAP 116* 110* 154* 134*   Lipid Profile: No results for input(s): CHOL, HDL, LDLCALC, TRIG, CHOLHDL, LDLDIRECT in the last 72 hours. Thyroid Function Tests: Recent Labs    04/09/19 0905  TSH 3.698  FREET4 0.73   Anemia Panel: No results for input(s): VITAMINB12, FOLATE, FERRITIN, TIBC, IRON, RETICCTPCT in the last 72 hours. Sepsis Labs: No results for input(s): PROCALCITON, LATICACIDVEN in the last 168 hours.  Recent Results (from the past 240 hour(s))  SARS Coronavirus 2 (CEPHEID - Performed in [place] hospital lab), Hosp Order     Status: None   Collection Time: 04/10/19  1:17 AM   Specimen: Nasopharyngeal Swab  Result Value Ref Range Status   SARS Coronavirus 2 NEGATIVE NEGATIVE Final    Comment: (NOTE) If result is NEGATIVE SARS-CoV-2 target nucleic acids are NOT DETECTED. The SARS-CoV-2 RNA is generally detectable in upper and lower  respiratory specimens  during the acute phase of infection. The lowest  concentration of SARS-CoV-2 viral copies this assay can detect is 250  copies / mL. A negative result does not preclude SARS-CoV-2 infection  and should not be used as the sole basis for treatment or other  patient management decisions.  A negative result may occur with  improper specimen collection / handling, submission of specimen other  than nasopharyngeal swab, presence of viral mutation(s) within the  areas targeted by this assay, and inadequate number of viral copies  (<250 copies / mL). A negative result must be combined with clinical  observations, patient history, and epidemiological information. If result is POSITIVE SARS-CoV-2 target nucleic acids are DETECTED. The SARS-CoV-2 RNA is generally detectable in upper and lower  respiratory specimens dur ing the acute phase of infection.  Positive  results are indicative of active infection with SARS-CoV-2.  Clinical  correlation with patient history and other diagnostic information is  necessary to determine patient infection status.  Positive results do  not rule out bacterial infection or co-infection with other viruses. If result is PRESUMPTIVE POSTIVE SARS-CoV-2 nucleic acids MAY BE PRESENT.   A presumptive positive result was obtained on the submitted specimen  and confirmed on repeat testing.  While 2019 novel coronavirus  (SARS-CoV-2) nucleic acids may be present in the submitted sample  additional confirmatory testing may be necessary for epidemiological  and / or clinical management purposes  to differentiate between  SARS-CoV-2 and other Sarbecovirus currently known to infect humans.  If clinically indicated additional testing with an alternate test  methodology [phone]) is advised. The SARS-CoV-2 RNA is generally  detectable in upper and lower respiratory sp ecimens during the acute  phase of infection. The expected result is Negative. Fact Sheet for Patients:   StrictlyIdeas.no Fact Sheet for Healthcare Providers: BankingDealers.co.za This test is not yet approved or cleared by the [place] FDA and has been authorized for detection and/or diagnosis of SARS-CoV-2 by FDA under an Emergency Use Authorization (EUA).  This EUA will remain in effect (meaning this test can be used) for the duration of the COVID-19 declaration under Section 564(b)(1) of the Act, 21 U.S.C. section 360bbb-3(b)(1), unless the authorization is terminated or revoked sooner. Performed at [facility], [place] [address], [place], [place] [address]   Surgical pcr screen     Status: None   Collection Time: 04/10/19 10:15 AM   Specimen: Nasal Mucosa; Nasal Swab  Result Value Ref Range Status   MRSA, PCR NEGATIVE NEGATIVE Final   Staphylococcus aureus NEGATIVE NEGATIVE Final    Comment: (NOTE) The Xpert SA Assay (FDA approved for NASAL specimens in patients 13 years of age and older), is one component of a comprehensive surveillance program. It is not intended to diagnose infection nor to guide or monitor treatment. Performed at [facility], [place] [address], [place], [place] [address]          Radiology Studies: Dg Chest 1 View  Result Date: 04/10/2019 CLINICAL DATA:  Apparent pain status post fall red EXAM: CHEST  1 VIEW COMPARISON:  CT from June 07/01/2019 FINDINGS: Again noted are emphysematous changes bilaterally. There is a well-positioned right-sided Port-A-Cath. 2 there is no pneumothorax. The and the there is scarring at the lung bases. There is no acute osseous abnormality. Heart size is normal. Aortic calcifications are again seen. The no IMPRESSION: No active disease. Electronically Signed   By: Constance Holster M.D.   On: 04/10/2019 01:05   Pelvis Portable  Result Date: 04/10/2019 CLINICAL DATA:  Postop EXAM: PORTABLE PELVIS 1-2 VIEWS COMPARISON:  04/10/2019 FINDINGS: Interval left  hip replacement with normal alignment. Small amount of gas in the soft tissues consistent with recent surgery. Stable sclerotic foci within the right ischium and right trochanter of the femur. IMPRESSION: 1. Status post left hip replacement with expected postsurgical changes. Electronically Signed   By: Donavan Foil M.D.   On: 04/10/2019 20:59   Dg C-arm 1-60 Min  Result Date: 04/10/2019 CLINICAL DATA:  Total hip replacement EXAM: DG C-ARM 61-120 MIN; OPERATIVE LEFT HIP WITH PELVIS COMPARISON:  04/10/2019 FINDINGS: Two low resolution intraoperative spot views of the left hip. Total fluoroscopy time was 10 seconds. The images demonstrate a left hip replacement with normal alignment. IMPRESSION: Intraoperative fluoroscopic assistance provided during left hip replacement surgery Electronically Signed   By: Donavan Foil M.D.   On: 04/10/2019 20:58   Dg Hip Operative Unilat  With Pelvis Left  Result Date: 04/10/2019 CLINICAL DATA:  Total hip replacement EXAM: DG C-ARM 61-120 MIN; OPERATIVE LEFT HIP WITH PELVIS COMPARISON:  04/10/2019 FINDINGS: Two low resolution intraoperative spot views of the left hip. Total fluoroscopy time was 10 seconds. The images demonstrate a left hip replacement with normal alignment. IMPRESSION: Intraoperative fluoroscopic assistance provided during left hip replacement surgery Electronically Signed   By: Donavan Foil M.D.   On: 04/10/2019 20:58   Dg Hip Unilat With Pelvis 2-3 Views Left  Result Date: 04/10/2019 CLINICAL DATA:  Fall EXAM: DG HIP (WITH OR WITHOUT PELVIS) 2-3V LEFT COMPARISON:  None. FINDINGS: There is an acute impacted transcervical fracture of the proximal left femur. There are stable sclerotic lesions in the right ischium and right femur. A large amount of stool throughout the colon is noted. IMPRESSION: Acute impacted transcervical fracture of the proximal left femur Electronically Signed   By: Constance Holster M.D.   On: 04/10/2019 01:04         Scheduled Meds: . docusate sodium  100 mg Oral BID  . enoxaparin (LOVENOX) injection  40 mg Subcutaneous Q24H  . feeding supplement (ENSURE ENLIVE)  237 mL Oral BID BM  . senna  1 tablet Oral BID   Continuous Infusions:   LOS: 1 day    Time spent: 35 mins.More than 50% of that time was spent in counseling and/or coordination of care.      Shelly Coss, MD [facility] Pager [phone]  If 7PM-7AM, please contact night-coverage [email or web address] Password [email or web address] 04/11/2019, 1:46 PM

## 2019-04-11 NOTE — Anesthesia Postprocedure Evaluation (Addendum)
Anesthesia Post Note  Patient: Brooke Washington  Procedure(s) Performed: TOTAL HIP ARTHROPLASTY ANTERIOR APPROACH LEFT HIP (Left Hip)     Patient location during evaluation: PACU Anesthesia Type: General Level of consciousness: awake and alert Pain management: pain level controlled Vital Signs Assessment: post-procedure vital signs reviewed and stable Respiratory status: spontaneous breathing, nonlabored ventilation, respiratory function stable and patient connected to nasal cannula oxygen Cardiovascular status: blood pressure returned to baseline and stable Postop Assessment: no apparent nausea or vomiting Anesthetic complications: no Comments: Requiring 6L [place], normal mentation, will monitor on progressive/step down unit    Last Vitals:  Vitals:   04/10/19 2215 04/11/19 0051  BP: (!) 88/51   Pulse: (!) 106   Resp: 16   Temp:  37.1 C  SpO2: 96%     Last Pain:  Vitals:   04/11/19 0051  TempSrc: Oral  PainSc:                  Effie Berkshire

## 2019-04-11 NOTE — Progress Notes (Signed)
Pts oxygen decreased from 5L O2 per Brooke Washington to 4L O2. When RN walked out of room pt took oxygen out of nose and her oxygen desatted to the 70's, HR increased to 120's. Oxygen placed back in nose and oxygen increased to low 90's. Educated pt. Will continue to monitor

## 2019-04-11 NOTE — Progress Notes (Signed)
When pt arrived from PACU, bed was wet with urine.  Bed linen changed.  Pt had not voided since that time.  Bladder scan performed, which showed 635 mls.  Pt stated she did not feel like she had to go.  Pt was able to void on her own with some encouragement.  Pt voided 500 mls of clear, dark urine.  Pt transitioned from 6L simple face mask to 6L nasal cannula.  Pt was maintaining 100% SpO2.  Transitioned pt down to 4L and she still maintained between 95-97% SpO2.  I brought her down to 2L [place] and she maintained between 92-95% SpO2.  I turned it off, and she called her husband and her oxygen dropped to the low 80s.  Pt came back up quickly after placing her on 4L [place].  Will continue to monitor.

## 2019-04-11 NOTE — Progress Notes (Signed)
    Subjective:  Patient reports pain as mild to moderate.  Denies N/V/CP/SOB. No c/o. Requiring 4 L 02 via [place].  Objective:   VITALS:   Vitals:   04/11/19 0800 04/11/19 1155 04/11/19 1558 04/11/19 1600  BP: (!) 99/41 (!) 108/56 (!) 97/52   Pulse: 79 96 93   Resp: 15 (!) 21 14   Temp: 98.2 F (36.8 C) 98.1 F (36.7 C) 98.5 F (36.9 C)   TempSrc: Oral Oral Oral Oral  SpO2: 90% 91% 100%   Weight:      Height:        NAD ABD soft Sensation intact distally Intact pulses distally Dorsiflexion/Plantar flexion intact Incision: dressing C/D/I Compartment soft   Lab Results  Component Value Date   WBC 5.0 04/11/2019   HGB 8.4 (L) 04/11/2019   HCT 25.4 (L) 04/11/2019   MCV 104.1 (H) 04/11/2019   PLT 143 (L) 04/11/2019   BMET    Component Value Date/Time   NA 139 04/11/2019 0437   NA 142 12/28/2016 1002   K 4.1 04/11/2019 0437   K 4.6 12/28/2016 1002   CL 102 04/11/2019 0437   CL 101 03/28/2013 0826   CO2 29 04/11/2019 0437   CO2 29 12/28/2016 1002   GLUCOSE 160 (H) 04/11/2019 0437   GLUCOSE 85 12/28/2016 1002   GLUCOSE 84 03/28/2013 0826   BUN 14 04/11/2019 0437   BUN 20.3 12/28/2016 1002   CREATININE 0.98 04/11/2019 0437   CREATININE 0.75 04/09/2019 0905   CREATININE 0.9 12/28/2016 1002   CALCIUM 8.7 (L) 04/11/2019 0437   CALCIUM 10.1 12/28/2016 1002   GFRNONAA 55 (L) 04/11/2019 0437   GFRNONAA >60 04/09/2019 0905   GFRAA >60 04/11/2019 0437   GFRAA >60 04/09/2019 0905     Assessment/Plan: 1 Day Post-Op   Principal Problem:   Closed left hip fracture, initial encounter (H[facility]) Active Problems:   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 [place], [place], [place] [address] Phone: [phone] [email or web address] Facebook  [employer]

## 2019-04-11 NOTE — Progress Notes (Signed)
Inpatient Rehabilitation Admissions Coordinator  Inpatient Rehab Consult received. I met with patient at the bedside for rehabilitation assessment. We discussed goals and expectations of an inpatient rehab admission.  She was hopeful that she could go home and go to Outpt therapy twice weekly as she did previously after shoulder surgery. Patient currently is Max assist transfers with therapy POD #1. I recommended a two week inpt rehab stay due to her functional level and her co morbidities. Pt requests that she discuss with her spouse by phone tonight and I will follow up in the morning to discuss with her and make contact with her spouse. Rehab venue pending more discussions.s  Danne Baxter, RN, MSN Rehab Admissions Coordinator [phone] 04/11/2019 4:28 PM

## 2019-04-11 NOTE — Evaluation (Addendum)
Physical Therapy Evaluation Patient Details Name: Brooke Washington MRN: [id number] DOB: 1940/11/24 Today's Date: 04/11/2019   History of Present Illness  Pt is a 78 y.o. F with significant PMH of CA, COPD who presents after a fall with a displaced left femoral neck fracture s/p left total hip arthroplasty anterior approach.  Clinical Impression  Pt s/p surgery listed above. Prior to admission, pt lives with her husband and granddaughter and is independent with mobility and ADL's. On PT evaluation, pt presents with decreased functional mobility secondary to decreased LLE range of motion, weakness, pain, balance impairments, ambulatory dysfunction, and decreased cardiopulmonary endurance. Pt requiring maximal assist for bed mobility and transferring to chair, unable to walk. Desaturation to 79% on 2L, increased to 4L and able to achieve > 90% with cues for pursed lip breathing, HR peak 122 bpm. Pt needs intensive rehabilitation (CIR) to address transfer and gait training to maximize functional independence and decreased caregiver burden.       Follow Up Recommendations CIR    Equipment Recommendations  Rolling walker with 5" wheels;3in1 (PT)    Recommendations for Other Services Rehab consult;OT consult     Precautions / Restrictions Precautions Precautions: Fall;Other (comment) Precaution Comments: watch O2 Restrictions Weight Bearing Restrictions: Yes LLE Weight Bearing: Weight bearing as tolerated      Mobility  Bed Mobility Overal bed mobility: Needs Assistance Bed Mobility: Supine to Sit     Supine to sit: Max assist     General bed mobility comments: Assist for LLE negotiation across bed and trunk elevation up to sitting going towards right side. Effortful and heavily guarding trunk   Transfers Overall transfer level: Needs assistance Equipment used: Rolling walker (2 wheeled);None Transfers: Sit to/from [employer] Sit to Stand: Max assist   Squat  pivot transfers: Max assist     General transfer comment: Pt able to achieve standing from elevated bed height to walker on 3rd attempt after visual and verbal cues for hand/foot placement, use of momentum and "nose over toes." Right foot blocked to prevent anterior slide, pt with heavy posterior lean upon standing. Sat back down then performed low pivot towards right with face to face transfer with x2 scoots. MaxA for both transfers  Ambulation/Gait                Stairs            Wheelchair Mobility    Modified Rankin (Stroke Patients Only)       Balance Overall balance assessment: Needs assistance Sitting-balance support: Feet supported Sitting balance-Leahy Scale: Fair     Standing balance support: Bilateral upper extremity supported Standing balance-Leahy Scale: Poor Standing balance comment: reliant on external support                             Pertinent Vitals/Pain Pain Assessment: Faces Faces Pain Scale: Hurts even more Pain Location: incisional Pain Descriptors / Indicators: Burning Pain Intervention(s): Limited activity within patient's tolerance;Monitored during session;Other (comment)(declining pain meds)    Home Living Family/patient expects to be discharged to:: Private residence Living Arrangements: Spouse/significant other;Other relatives(granddaughter (9 y.o.)) Available Help at Discharge: Family Type of Home: House Home Access: Stairs to enter   Technical brewer of Steps: 1 Home Layout: Able to live on main level with bedroom/bathroom        Prior Function Level of Independence: Independent         Comments: Reports used to work  out regularly with husband at [facility] prior to [place]        Extremity/Trunk Assessment   Upper Extremity Assessment Upper Extremity Assessment: Generalized weakness    Lower Extremity Assessment Lower Extremity Assessment: Generalized weakness;LLE  deficits/detail LLE Deficits / Details: s/p THA, grossly 2/5, limited by pain       Communication   Communication: No difficulties  Cognition Arousal/Alertness: Awake/alert Behavior During Therapy: WFL for tasks assessed/performed Overall Cognitive Status: Impaired/Different from baseline Area of Impairment: Problem solving                             Problem Solving: Slow processing;Difficulty sequencing;Requires verbal cues;Requires tactile cues General Comments: WFL for basic mobility tasks but did note slower processing      General Comments      Exercises     Assessment/Plan    PT Assessment Patient needs continued PT services  PT Problem List Decreased strength;Decreased range of motion;Decreased activity tolerance;Decreased balance;Decreased mobility;Pain       PT Treatment Interventions DME instruction;Gait training;Functional mobility training;Therapeutic activities;Therapeutic exercise;Balance training;Patient/family education    PT Goals (Current goals can be found in the Care Plan section)  Acute Rehab PT Goals Patient Stated [facility]: [facility] PT Goal Formulation: With patient Time For Goal Achievement: 04/25/19 Potential to Achieve Goals: Good    Frequency Min 5X/week   Barriers to discharge        Co-evaluation               AM-PAC PT "6 Clicks" Mobility  Outcome Measure Help needed turning from your back to your side while in a flat bed without using bedrails?: A Lot Help needed moving from lying on your back to sitting on the side of a flat bed without using bedrails?: Total Help needed moving to and from a bed to a chair (including a wheelchair)?: Total Help needed standing up from a chair using your arms (e.g., wheelchair or bedside chair)?: Total Help needed to walk in hospital room?: Total Help needed climbing 3-5 steps with a railing? : Total 6 Click Score: 7    End of Session Equipment Utilized During Treatment:  Gait belt Activity Tolerance: Patient tolerated treatment well Patient left: in chair;with call bell/phone within reach Nurse Communication: Mobility status(O2) PT Visit Diagnosis: Other abnormalities of gait and mobility (R26.89);Muscle weakness (generalized) (M62.81);Difficulty in walking, not elsewhere classified (R26.2);Pain Pain - Right/Left: Left Pain - part of body: Hip    Time: 0821-0910 PT Time Calculation (min) (ACUTE ONLY): 49 min   Charges:   PT Evaluation $PT Eval Moderate Complexity: 1 Mod PT Treatments $Gait Training: 8-22 mins $Therapeutic Activity: 8-22 mins        Ellamae Sia, PT, DPT Acute Rehabilitation Services Pager [phone] Office [phone]   Willy Eddy 04/11/2019, 9:56 AM

## 2019-04-11 NOTE — Progress Notes (Signed)
Rehab Admissions Coordinator Note:  Patient was screened by Michel Santee, PT, DPT for appropriateness for an Inpatient Acute Rehab Consult.  At this time, we are recommending Inpatient Rehab consult.  Please place IP Rehab MD Consult order.   Michel Santee 04/11/2019, 10:25 AM  I can be reached at [phone].

## 2019-04-12 ENCOUNTER — Telehealth: Payer: Self-pay | Admitting: Pharmacist

## 2019-04-12 ENCOUNTER — Inpatient Hospital Stay (HOSPITAL_COMMUNITY): Payer: Medicare Other

## 2019-04-12 LAB — BASIC METABOLIC PANEL
Anion gap: 7 (ref 5–15)
BUN: 17 mg/dL (ref 8–23)
CO2: 29 mmol/L (ref 22–32)
Calcium: 8.7 mg/dL — ABNORMAL LOW (ref 8.9–10.3)
Chloride: 104 mmol/L (ref 98–111)
Creatinine, Ser: 0.89 mg/dL (ref 0.44–1.00)
GFR calc Af Amer: >60 mL/min (ref >60)
GFR calc non Af Amer: >60 mL/min (ref >60)
Glucose, Bld: 95 mg/dL (ref 70–99)
Potassium: 3.6 mmol/L (ref 3.5–5.1)
Sodium: 140 mmol/L (ref 135–145)

## 2019-04-12 LAB — RETICULOCYTES
Immature Retic Fract: 8.1 % (ref 2.3–15.9)
RBC.: 2.17 MIL/uL — ABNORMAL LOW (ref 3.87–5.11)
Retic Count, Absolute: 42.7 10*3/uL (ref 19.0–186.0)
Retic Ct Pct: 2 % (ref 0.4–3.1)

## 2019-04-12 LAB — CBC
HCT: 22.7 % — ABNORMAL LOW (ref 36.0–46.0)
Hemoglobin: 7.4 g/dL — ABNORMAL LOW (ref 12.0–15.0)
MCH: 33.8 pg (ref 26.0–34.0)
MCHC: 32.6 g/dL (ref 30.0–36.0)
MCV: 103.7 fL — ABNORMAL HIGH (ref 80.0–100.0)
Platelets: 123 10*3/uL — ABNORMAL LOW (ref 150–400)
RBC: 2.19 MIL/uL — ABNORMAL LOW (ref 3.87–5.11)
RDW: 13.7 % (ref 11.5–15.5)
WBC: 5.3 10*3/uL (ref 4.0–10.5)
nRBC: 0 % (ref 0.0–0.2)

## 2019-04-12 LAB — FERRITIN: Ferritin: 135 ng/mL (ref 11–307)

## 2019-04-12 LAB — IRON AND TIBC
Iron: 14 ug/dL — ABNORMAL LOW (ref 28–170)
Saturation Ratios: 6 % — ABNORMAL LOW (ref 10.4–31.8)
TIBC: 253 ug/dL (ref 250–450)
UIBC: 239 ug/dL

## 2019-04-12 LAB — SAVE SMEAR(SSMR), FOR PROVIDER SLIDE REVIEW

## 2019-04-12 LAB — GLUCOSE, CAPILLARY
Glucose-Capillary: 94 mg/dL (ref 70–99)
Glucose-Capillary: 90 mg/dL (ref 70–99)
Glucose-Capillary: 109 mg/dL — ABNORMAL HIGH (ref 70–99)

## 2019-04-12 LAB — VITAMIN B12: Vitamin B-12: 94 pg/mL — ABNORMAL LOW (ref 180–914)

## 2019-04-12 NOTE — Progress Notes (Signed)
This RN could not wean patient down less than 4L O2 per [place]. Pt has been educated several times on the use of the IS- Pt is using when being reminded but seems hesitant.

## 2019-04-12 NOTE — Progress Notes (Signed)
PROGRESS NOTE  [name] MLY:[id number] DOB: [birth year] DOA: 04/09/2019 PCP: [name], NP  HPI/Recap of past 24 hours: Patient is a 78 year old female with history of lymphoma on Revlimid, COPD who presents from home after a mechanical fall. X-ray done in the emergency department showed left transcervical proximal femur fracture. She is usually ambulatory at home and ambulates without the help of walker or cane. She lives with her husband. Orthopedics consulted and she underwent left total hip arthroplasty.  Last night she became hypotensive and hypoxic after surgery.  Currently hemodynamically stable.  Physical therapy recommended her to go to CIR on discharge.  04/12/19: Patient seen and examined at her bedside.  She is alert and oriented x3.  Hypoxic overnight requiring 4 L of oxygen by nasal cannula.  Reports not on oxygen supplementation at baseline.  Also hemoglobin drop from baseline of 13 to 8.4 this morning, normal platelet count.  No sign of overt bleeding.    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 Left total hip arthroplasty done on 04/10/2019 by Dr. Lyla Glassing Continue PT OT as recommended by orthopedic surgery On subcu Lovenox for DVT prophylaxis PT OT evaluated and recommended CIR CIR has been consulted and following Pain management in place as needed WBAT with walker DVT ppx: Lovenox in house --> d/c on ASA 81 mg PO BID x 6 weeks, SCDs, TEDS  Acute hypoxic respiratory failure, unclear etiology Independently reviewed chest x-ray done on admission showed no active pulmonary disease. Repeat chest x-ray on 04/12/2019 due to persistent hypoxia Maintain O2 saturation greater than 92% Normal O2 supplementation at home  Acute blood loss anemia post  surgery Hemoglobin at baseline 13 Hemoglobin on 720 dropped to 8.4 MCV 104 Plt count 143 Closely monitor H&H No clear or obvious sign of bleeding Not on IV fluid hydration at this time to suspect dilution  Ambulatory dysfunction closed left hip repair PT OT assessed and recommended CIR CIR following Continue PT as recommended by orthopedic surgery Weightbearing as tolerated with walker  Hypotension with no prior history of hypertension Not on antihypertensive medications at home Maintain map greater than 72  History of lymphoma Follows with oncology outpatient On Revlimid    DVT prophylaxis: Lovenox subcu daily Code Status: Full Family Communication:  None at bedside. Disposition Plan:  Possibly to CIR if bed is available and the patient is agreeable.  CIR when bed is available   Consultants: Orthopedics  Procedures:  Left hip arthroplasty    Objective: Vitals:   04/12/19 0000 04/12/19 0400 04/12/19 0729 04/12/19 0800  BP: (!) 103/56 (!) 108/45 (!) 115/58 (!) 103/56  Pulse: 96 95 (!) 105 96  Resp: (!) 22 (!) 21 18 (!) 24  Temp:  98.5 F (36.9 C) 98.2 F (36.8 C)   TempSrc:  Oral Oral   SpO2: 98% 100% 98% 100%  Weight:      Height:        Intake/Output Summary (Last 24 hours) at 04/12/2019 0920 Last data filed at 04/12/2019 8127 Gross per 24 hour  Intake 460 ml  Output 600 ml  Net -140 ml   Filed Weights   04/09/19 2326 04/10/19 1614  Weight: 52.2 kg 52.2 kg    Exam:  . General: 78 y.o. year-old female well developed well  nourished in no acute distress.  Alert and oriented x3. . Cardiovascular: Regular rate and rhythm with no rubs or gallops.  No thyromegaly or JVD noted.   Marland Kitchen Respiratory: Clear to auscultation with no wheezes or rales. Good inspiratory effort. . Abdomen: Soft nontender nondistended with normal bowel sounds x4 quadrants. . Musculoskeletal: No lower extremity edema. 2/4 pulses in all 4 extremities.  Mild bruising noted at left hip with  petechia involving thigh. . Psychiatry: Mood is appropriate for condition and setting   Data Reviewed: CBC: Recent Labs  Lab 04/09/19 0905 04/09/19 2339 04/10/19 0620 04/11/19 0437 04/12/19 0550  WBC 5.3 5.8 8.2 5.0 5.3  NEUTROABS 2.5 3.6 5.9 4.1  --   HGB 13.2 12.9 13.0 8.4* 7.4*  HCT 39.4 38.3 38.4 25.4* 22.7*  MCV 101.0* 103.2* 101.3* 104.1* 103.7*  PLT 218 203 207 143* 412*   Basic Metabolic Panel: Recent Labs  Lab 04/09/19 0905 04/09/19 2339 04/10/19 0620 04/11/19 0437 04/12/19 0550  NA 142 141 138 139 140  K 3.7 3.6 3.8 4.1 3.6  CL 103 106 102 102 104  CO2 28 24 27 29 29   GLUCOSE 82 108* 124* 160* 95  BUN 10 12 10 14 17   CREATININE 0.75 0.62 0.82 0.98 0.89  CALCIUM 9.2 8.5* 8.6* 8.7* 8.7*   GFR: Estimated Creatinine Clearance: 42.9 mL/min (by C-G formula based on SCr of 0.89 mg/dL). Liver Function Tests: Recent Labs  Lab 04/09/19 0905 04/10/19 0620  AST 20 24  ALT 12 16  ALKPHOS 124 105  BILITOT 0.6 1.1  PROT 6.1* 5.7*  ALBUMIN 4.0 3.9   No results for input(s): LIPASE, AMYLASE in the last 168 hours. No results for input(s): AMMONIA in the last 168 hours. Coagulation Profile: Recent Labs  Lab 04/09/19 2339  INR 0.9   Cardiac Enzymes: No results for input(s): CKTOTAL, CKMB, CKMBINDEX, TROPONINI in the last 168 hours. BNP (last 3 results) No results for input(s): PROBNP in the last 8760 hours. HbA1C: No results for input(s): HGBA1C in the last 72 hours. CBG: Recent Labs  Lab 04/10/19 1151 04/11/19 0054 04/11/19 1156 04/11/19 2351 04/12/19 0627  GLUCAP 110* 154* 134* 96 94   Lipid Profile: No results for input(s): CHOL, HDL, LDLCALC, TRIG, CHOLHDL, LDLDIRECT in the last 72 hours. Thyroid Function Tests: No results for input(s): TSH, T4TOTAL, FREET4, T3FREE, THYROIDAB in the last 72 hours. Anemia Panel: No results for input(s): VITAMINB12, FOLATE, FERRITIN, TIBC, IRON, RETICCTPCT in the last 72 hours. Urine analysis:    Component  Value Date/Time   COLORURINE YELLOW 12/17/2017 2213   APPEARANCEUR CLEAR 12/17/2017 2213   LABSPEC 1.017 12/17/2017 2213   PHURINE 7.0 12/17/2017 2213   GLUCOSEU NEGATIVE 12/17/2017 2213   HGBUR NEGATIVE 12/17/2017 2213   BILIRUBINUR NEGATIVE 12/17/2017 2213   BILIRUBINUR n 04/30/2016 1055   KETONESUR NEGATIVE 12/17/2017 2213   PROTEINUR 30 (A) 12/17/2017 2213   UROBILINOGEN 0.2 04/30/2016 1055   UROBILINOGEN 0.2 01/28/2009 1035   NITRITE NEGATIVE 12/17/2017 2213   LEUKOCYTESUR NEGATIVE 12/17/2017 2213   Sepsis Labs: @LABRCNTIP (procalcitonin:4,lacticidven:4)  ) Recent Results (from the past 240 hour(s))  SARS Coronavirus 2 (CEPHEID - Performed in [place] hospital lab), Hosp Order     Status: None   Collection Time: 04/10/19  1:17 AM   Specimen: Nasopharyngeal Swab  Result Value Ref Range Status   SARS Coronavirus 2 NEGATIVE NEGATIVE Final    Comment: (NOTE) If result is NEGATIVE SARS-CoV-2 target nucleic acids are NOT DETECTED. The SARS-CoV-2  RNA is generally detectable in upper and lower  respiratory specimens during the acute phase of infection. The lowest  concentration of SARS-CoV-2 viral copies this assay can detect is 250  copies / mL. A negative result does not preclude SARS-CoV-2 infection  and should not be used as the sole basis for treatment or other  patient management decisions.  A negative result may occur with  improper specimen collection / handling, submission of specimen other  than nasopharyngeal swab, presence of viral mutation(s) within the  areas targeted by this assay, and inadequate number of viral copies  (<250 copies / mL). A negative result must be combined with clinical  observations, patient history, and epidemiological information. If result is POSITIVE SARS-CoV-2 target nucleic acids are DETECTED. The SARS-CoV-2 RNA is generally detectable in upper and lower  respiratory specimens dur ing the acute phase of infection.  Positive  results  are indicative of active infection with SARS-CoV-2.  Clinical  correlation with patient history and other diagnostic information is  necessary to determine patient infection status.  Positive results do  not rule out bacterial infection or co-infection with other viruses. If result is PRESUMPTIVE POSTIVE SARS-CoV-2 nucleic acids MAY BE PRESENT.   A presumptive positive result was obtained on the submitted specimen  and confirmed on repeat testing.  While 2019 novel coronavirus  (SARS-CoV-2) nucleic acids may be present in the submitted sample  additional confirmatory testing may be necessary for epidemiological  and / or clinical management purposes  to differentiate between  SARS-CoV-2 and other Sarbecovirus currently known to infect humans.  If clinically indicated additional testing with an alternate test  methodology [phone]) is advised. The SARS-CoV-2 RNA is generally  detectable in upper and lower respiratory sp ecimens during the acute  phase of infection. The expected result is Negative. Fact Sheet for Patients:  StrictlyIdeas.no Fact Sheet for Healthcare Providers: BankingDealers.co.za This test is not yet approved or cleared by the [place] FDA and has been authorized for detection and/or diagnosis of SARS-CoV-2 by FDA under an Emergency Use Authorization (EUA).  This EUA will remain in effect (meaning this test can be used) for the duration of the COVID-19 declaration under Section 564(b)(1) of the Act, 21 U.S.C. section 360bbb-3(b)(1), unless the authorization is terminated or revoked sooner. Performed at [facility], [place] [address] [place], [place] [address]   Surgical pcr screen     Status: None   Collection Time: 04/10/19 10:15 AM   Specimen: Nasal Mucosa; Nasal Swab  Result Value Ref Range Status   MRSA, PCR NEGATIVE NEGATIVE Final   Staphylococcus aureus NEGATIVE NEGATIVE Final    Comment:  (NOTE) The Xpert SA Assay (FDA approved for NASAL specimens in patients 60 years of age and older), is one component of a comprehensive surveillance program. It is not intended to diagnose infection nor to guide or monitor treatment. Performed at [facility], [place] [address], [place], [place] [address]       Studies: No results found.  Scheduled Meds: . docusate sodium  100 mg Oral BID  . enoxaparin (LOVENOX) injection  40 mg Subcutaneous Q24H  . feeding supplement (ENSURE ENLIVE)  237 mL Oral BID BM  . senna  1 tablet Oral BID    Continuous Infusions:   LOS: 2 days     Kayleen Memos, MD [facility] Pager [phone]  If 7PM-7AM, please contact night-coverage [email or web address] Password [email or web address] 04/12/2019, 9:20 AM

## 2019-04-12 NOTE — Telephone Encounter (Signed)
Oral Oncology Pharmacist Encounter  Received call from inpatient pharmacist at [facility] with questions about Revlimid continuation for patient that is status post hip replacement, surgery performed 04/10/2019.  Patient was admitted through the ED on 04/09/2019 after a fall and found to have left hip fracture. Patient has been admitted since that time and has not received Revlimid while admitted.  Patient had been taking Revlimid 10 mg once a daily for 1 week on, one-week off, and repeated for the treatment of large cell lymphoma.  Patient's hemoglobin has been decreasing since surgery and is down to 7.4 g/dL on CBC check this morning.  Inpatient pharmacist instructed to continue to hold Revlimid at this time. They are awaiting bed placement at a rehab facility for care post discharge from the hospital.  Note will be forwarded to Dr. Alvy Bimler for Revlimid plan.  Johny Drilling, PharmD, BCPS, BCOP  04/12/2019 11:15 AM Oral Oncology Clinic [phone]

## 2019-04-12 NOTE — PMR Pre-admission (Signed)
PMR Admission Coordinator Pre-Admission Assessment  Patient: Brooke Washington is an 78 y.o., female MRN: [id number] DOB: 1940-11-24 Height: 5' 4" (162.6 cm) Weight: 52.2 kg  Insurance Information HMO:     PPO:      PCP:      IPA:      80/20:      OTHER: no HMO PRIMARY: Medicare a and b      Policy#: [id number]      Subscriber: pt Benefits:  Phone #: passport one online     Name: 04/12/2019 Eff. Date: 10/11/2005     Deduct: $1408      Out of Pocket Max: none      Life Max: none CIR: 100%      SNF: 20 full days Outpatient: 80%     Co-Pay: 20% Home Health: 100%      Co-Pay: none DME: 805     Co-Pay: 20% Providers: pt choice  SECONDARY: BCBS of [place] supplement      Policy#: [id number]      Subscriber: pt  Medicaid Application Date:       Case Manager:  Disability Application Date:       Case Worker:   The "Data Collection Information Summary" for patients in Inpatient Rehabilitation Facilities with attached "Privacy Act [place] Records" was provided and verbally reviewed with: Patient and Family  Emergency Contact Information Contact Information    Name Relation Home Work Mobile   [place] Spouse [phone]        Current Medical History  Patient Admitting Diagnosis: left femoral neck fracture  History of Present Illness: 78 year old female with past medical history of lymphoma, humerus fracture 2019 and COPD. Presented to ED on 03/31/2019 after a fall at home. Loss of balance while she was attempting to pick something up. XRAY showed left transcervical proximal femur fracture.   Ortho consulted for displaced left femoral neck fracture. Dr. Lyla Glassing to OR for left total hip arthroplasty, anterior approach.Postoperatively pt somewhat hypoxic and maintaining on 4 liters oxygen. Pt not on oxygen at home. Hgb drop from 13 to 7.0 on 7/2. Baseline Hgb 13.0 on 6/20. Transfused 2 units PRBCs on 7/3. No BM, no worsening edema at surgical site.   DVT prophylaxis Lovenox  in house. Plan to d/c on ASA 81 mg po BID for 6 weeks, SCDs and TEDS.    Patient's oncologist, Dr. Alvy Bimler aware of admit. Not to resume Revlimid until return in follow up.  Patient's medical record from [facility]  has been reviewed by the rehabilitation admission coordinator and physician.  Past Medical History  Past Medical History:  Diagnosis Date  . Cancer ([place])  APRIL OF 2000   LOW-GRADE NON-HODGKIN'S LYMPHOMA  . COPD (chronic obstructive pulmonary disease) ([place])   . Depression   . Emphysema of lung ([place])   . GERD (gastroesophageal reflux disease)   . Hyperlipidemia     Family History   family history includes Cancer in her mother.  Prior Rehab/Hospitalizations Has the patient had prior rehab or hospitalizations prior to admission? Yes  Has the patient had major surgery during 100 days prior to admission? Yes   Current Medications  Current Facility-Administered Medications:  .  acetaminophen (TYLENOL) tablet 325-650 mg, 325-650 mg, Oral, Q6H PRN, Swinteck, Aaron Edelman, MD .  docusate sodium (COLACE) capsule 100 mg, 100 mg, Oral, BID, Swinteck, Aaron Edelman, MD, 100 mg at 04/13/19 1047 .  enoxaparin (LOVENOX) injection 40 mg, 40 mg, Subcutaneous, Q24H, Swinteck, Aaron Edelman, MD, 40  mg at 04/13/19 0856 .  feeding supplement (ENSURE ENLIVE) (ENSURE ENLIVE) liquid 237 mL, 237 mL, Oral, BID BM, Swinteck, Brian, MD, 237 mL at 04/13/19 1048 .  ferrous sulfate tablet 325 mg, 325 mg, Oral, BID WC, Hall, Carole N, DO, 325 mg at 04/13/19 0855 .  HYDROcodone-acetaminophen (NORCO) 7.5-325 MG per tablet 1-2 tablet, 1-2 tablet, Oral, Q4H PRN, Swinteck, Aaron Edelman, MD .  HYDROcodone-acetaminophen (NORCO/VICODIN) 5-325 MG per tablet 1-2 tablet, 1-2 tablet, Oral, Q4H PRN, Rod Can, MD, 1 tablet at 04/12/19 [phone] .  ipratropium-albuterol (DUONEB) 0.5-2.5 (3) MG/3ML nebulizer solution 3 mL, 3 mL, Nebulization, Q6H PRN, Rod Can, MD, 3 mL at 04/10/19 2050 .  menthol-cetylpyridinium (CEPACOL)  lozenge 3 mg, 1 lozenge, Oral, PRN **OR** phenol (CHLORASEPTIC) mouth spray 1 spray, 1 spray, Mouth/Throat, PRN, Swinteck, Aaron Edelman, MD .  metoCLOPramide (REGLAN) tablet 5-10 mg, 5-10 mg, Oral, Q8H PRN **OR** metoCLOPramide (REGLAN) injection 5-10 mg, 5-10 mg, Intravenous, Q8H PRN, Swinteck, Aaron Edelman, MD .  morphine 2 MG/ML injection 0.5-1 mg, 0.5-1 mg, Intravenous, Q2H PRN, Swinteck, Aaron Edelman, MD .  ondansetron (ZOFRAN) tablet 4 mg, 4 mg, Oral, Q6H PRN **OR** ondansetron (ZOFRAN) injection 4 mg, 4 mg, Intravenous, Q6H PRN, Swinteck, Aaron Edelman, MD .  senna (SENOKOT) tablet 8.6 mg, 1 tablet, Oral, BID, Swinteck, Aaron Edelman, MD, 8.6 mg at 04/13/19 1047 .  sodium chloride flush (NS) 0.9 % injection 10-40 mL, 10-40 mL, Intracatheter, PRN, Irene Pap N, DO  Patients Current Diet:  Diet Order            Diet Carb Modified Fluid consistency: Thin; Room service appropriate? Yes  Diet effective now              Precautions / Restrictions Precautions Precautions: Fall Precaution Comments: watch O2 Restrictions Weight Bearing Restrictions: Yes LLE Weight Bearing: Weight bearing as tolerated   Has the patient had 2 or more falls or a fall with injury in the past year? Yes  Prior Activity Level    Prior Functional Level Self Care: Did the patient need help bathing, dressing, using the toilet or eating? Independent  Indoor Mobility: Did the patient need assistance with walking from room to room (with or without device)? Independent  Stairs: Did the patient need assistance with internal or external stairs (with or without device)? Independent  Functional Cognition: Did the patient need help planning regular tasks such as shopping or remembering to take medications? Independent  Home Assistive Devices / Equipment Home Assistive Devices/Equipment: CBG Meter Home Equipment: Cane - single point, Toilet riser  Prior Device Use: Indicate devices/aids used by the patient prior to current illness, exacerbation  or injury? None of the above  Current Functional Level Cognition  Overall Cognitive Status: Within Functional Limits for tasks assessed Orientation Level: Oriented X4 General Comments: WFL for basic mobility tasks but did note slower processing    Extremity Assessment (includes Sensation/Coordination)  Upper Extremity Assessment: Generalized weakness  Lower Extremity Assessment: Defer to PT evaluation LLE Deficits / Details: s/p THA, grossly 2/5, limited by pain    ADLs  Overall ADL's : Needs assistance/impaired Toilet Transfer: Squat-pivot, Maximal assistance, Moderate assistance, +2 for physical assistance Toilet Transfer Details (indicate cue type and reason): max difficulty initiating pivotal steps. ultimately max +2 assist to advance B hips to [facility], going to R side.  General ADL Comments: Pt received in bed with NT needing to void. Pt struggled with pivotal transfer to [facility]. +2 mod A for percare. Pt then ambulated in the room, about the distance  to the bathroom, with some encouragment.     Mobility  Overal bed mobility: Needs Assistance Bed Mobility: Supine to Sit Supine to sit: Max assist, HOB elevated General bed mobility comments: Pt reaching for therapist to assist with pulling trunk up to get to EOB. Max A with trunk, bottom and LEs to get to EOB. + dizziness.    Transfers  Overall transfer level: Needs assistance Equipment used: Rolling walker (2 wheeled) Transfers: Sit to/from Stand Sit to Stand: Mod assist, +2 safety/equipment Squat pivot transfers: Max assist, +2 physical assistance General transfer comment: Assist of 2 to power to standing from EOB x2, from [facility] x1. Cues for hand placement/technique. Cues for upright posture. Favors placing weight through RLE.    Ambulation / Gait / Stairs / Wheelchair Mobility  Ambulation/Gait Ambulation/Gait assistance: Mod assist, +2 safety/equipment Gait Distance (Feet): 12 Feet Assistive device: Rolling walker (2 wheeled) Gait  Pattern/deviations: Trunk flexed, Step-to pattern, Decreased step length - right, Decreased step length - left, Decreased stride length, Decreased weight shift to left General Gait Details: Deferred secondary to low Hemoglobin and pt politely decling.    Posture / Balance Balance Overall balance assessment: Needs assistance Sitting-balance support: Feet supported, Bilateral upper extremity supported Sitting balance-Leahy Scale: Fair Standing balance support: During functional activity Standing balance-Leahy Scale: Poor Standing balance comment: reliant on external support and BUEs on RW.    Special needs/care consideration BiPAP/CPAP  N/a CPM  N/a Continuous Drip IV  Right chest porto cath Dialysis  N/a Life Vest  N/a Oxygen  None at baseline PTA; O2 at * liters [place] Special Bed  N/a Trach Size  N/a Wound Vac n/a Skin surgical incision with dressing Bowel mgmt:  LBM  6/29 Bladder mgmt:  External catheter Diabetic mgmt:  N/a Behavioral consideration  N/a Chemo/radiation  Yes history of chemo currently in remission   Previous Home Environment  Living Arrangements: Spouse/significant other, Other relatives  Lives With: Spouse(106 year old grand daughter live with her grandparents) Available Help at Discharge: Family, Available 24 hours/day Type of Home: House Home Layout: Two level, Able to live on main level with bedroom/bathroom, Full bath on main level Home Access: Level entry Entrance Stairs-Number of Steps: 1 Bathroom Shower/Tub: Walk-in shower, Tub/shower unit(have both available on first level) Biochemist, clinical: Handicapped height Bathroom Accessibility: Yes How Accessible: Accessible via walker Home Care Services: No Additional Comments: Pt's daughter works for [employer] SU[employer]  Discharge Living Setting Plans for Discharge Living Setting: Patient's home, Lives with (comment)(sposue and 56 year old grand daughter) Type of Home at Discharge: House Discharge Home Layout:  Two level, Full bath on main level, Able to live on main level with bedroom/bathroom Discharge Home Access: Level entry Discharge Bathroom Shower/Tub: Tub/shower unit, Walk-in shower Discharge Bathroom Toilet: Handicapped height Discharge Bathroom Accessibility: Yes How Accessible: Accessible via walker Does the patient have any problems obtaining your medications?: No  Social/Family/Support Systems Patient Roles: Spouse, Parent Contact Information: spouse, Herbie [place] Anticipated Caregiver: spouse and grand daughter Anticipated Caregiver's Contact Information: [phone] Ability/Limitations of Caregiver: no limitations Caregiver Availability: 24/7 Discharge Plan Discussed with Primary Caregiver: Yes Is Caregiver In Agreement with Plan?: Yes Does Caregiver/Family have Issues with Lodging/Transportation while Pt is in Rehab?: No  Goals/Additional Needs Patient/Family Goal for Rehab: superivsion to min asisst with PT and OT Expected length of stay: ELOS 10 to 14 days Pt/Family Agrees to Admission and willing to participate: Yes Program Orientation Provided & Reviewed with Pt/Caregiver Including Roles  & Responsibilities: Yes  Decrease burden of Care through IP rehab admission: n/a  Possible need for SNF placement upon discharge: not anticipated  Patient Condition: I have reviewed medical records from [facility]  and patient and spouse. I met with patient at the bedside for inpatient rehabilitation assessment.  Patient will benefit from ongoing PT and OT, can actively participate in 3 hours of therapy a day 5 days of the week, and can make measurable gains during the admission.  Patient will also benefit from the coordinated team approach during an Inpatient Acute Rehabilitation admission.  The patient will receive intensive therapy as well as Rehabilitation physician, nursing, social worker, and care management interventions.  Due to bladder management, bowel management, safety,  skin/wound care, disease management, medication administration, pain management and patient education the patient requires 24 hour a day rehabilitation nursing.  The patient is currently mod assist with mobility and basic ADLs.  Discharge setting and therapy post discharge at home with home health is anticipated.  Patient has agreed to participate in the Acute Inpatient Rehabilitation Program and will admit 04/14/2019.  Preadmission Screen Completed By:  Cleatrice Burke RN MSN 04/13/2019 3:14 PM ______________________________________________________________________   Discussed status with Dr. Posey Pronto  on 04/13/2019 at 1200 and received approval for admission Saturday 04/14/2019  Admission Coordinator:  Cleatrice Burke, RN MSN, time 1200 Date  04/13/2019   Assessment/Plan: Diagnosis: Left hip fracture  1. Does the need for close, 24 hr/day Medical supervision in concert with the patient's rehab needs make it unreasonable for this patient to be served in a less intensive setting? Yes  2. Co-Morbidities requiring supervision/potential complications: lymphoma, humerus fracture 2019 and COPD 3. Due to bladder management, safety, skin/wound care, disease management, pain management and patient education, does the patient require 24 hr/day rehab nursing? Yes 4. Does the patient require coordinated care of a physician, rehab nurse, PT (1-2 hrs/day, 5 days/week) and OT (1-2 hrs/day, 5 days/week) to address physical and functional deficits in the context of the above medical diagnosis(es)? Yes Addressing deficits in the following areas: balance, endurance, locomotion, strength, transferring, bathing, dressing, toileting and psychosocial support 5. Can the patient actively participate in an intensive therapy program of at least 3 hrs of therapy 5 days a week? Yes 6. The potential for patient to make measurable gains while on inpatient rehab is excellent 7. Anticipated functional outcomes upon discharge  from inpatients are: min assist PT, min assist OT, n/a SLP 8. Estimated rehab length of stay to reach the above functional goals is: 14-17 days. 9. Anticipated D/C setting: Home 10. Anticipated post D/C treatments: HH therapy and Home excercise program 11. Overall Rehab/Functional Prognosis: good  MD Signature: Delice Lesch, MD, Maxine Glenn 04/14/2019

## 2019-04-12 NOTE — Telephone Encounter (Signed)
Called and gave husband below message. He verbalized understating.

## 2019-04-12 NOTE — Telephone Encounter (Signed)
Do not resume Revlimid until she is back home Hassan Rowan, please call her husband to let him know Also, have him call us when she is discharged home so I can schedule return appt

## 2019-04-12 NOTE — Evaluation (Signed)
Occupational Therapy Evaluation Patient Details Name: Brooke Washington MRN: [id number] DOB: [birth year] Today's Date: 04/12/2019    History of Present Illness Pt is a 78 y.o. F with significant PMH of CA, COPD who presents after a fall with a displaced left femoral neck fracture s/p left total hip arthroplasty anterior approach.   Clinical Impression   Pt admitted with the above diagnoses and presents with below problem list. Pt will benefit from continued acute OT to address the below listed deficits and maximize independence with basic ADLs prior to d/c to venue below. PTA pt was independent with ADLs. Pt is currently mod A +2 safety with LB ADLs and functional mobility, +2 max A to squat pivot. Pt needing some encouragement (noted Hbg 7.4 and "feeling weak") but very willing to work with therapy and appreciative.      Follow Up Recommendations  CIR    Equipment Recommendations  Other (comment)(defer to next venue)    Recommendations for Other Services       Precautions / Restrictions Precautions Precautions: Fall;Other (comment) Precaution Comments: watch O2 Restrictions Weight Bearing Restrictions: Yes LLE Weight Bearing: Weight bearing as tolerated      Mobility Bed Mobility Overal bed mobility: Needs Assistance Bed Mobility: Supine to Sit     Supine to sit: Mod assist;HOB elevated     General bed mobility comments: cues for sequencing. assist to powerup and pivot hips to full EOB position. Weak UEs  Transfers Overall transfer level: Needs assistance Equipment used: Rolling walker (2 wheeled) Transfers: Sit to/from [employer] Sit to Stand: Max assist;+2 safety/equipment   Squat pivot transfers: Max assist;+2 physical assistance          Balance Overall balance assessment: Needs assistance Sitting-balance support: Feet supported Sitting balance-Leahy Scale: Fair     Standing balance support: Bilateral upper extremity supported Standing  balance-Leahy Scale: Poor Standing balance comment: reliant on external support; rw + assist                           ADL either performed or assessed with clinical judgement   ADL Overall ADL's : Needs assistance/impaired                         Toilet Transfer: Squat-pivot;Maximal assistance;Moderate assistance;+2 for physical assistance Toilet Transfer Details (indicate cue type and reason): max difficulty initiating pivotal steps. ultimately max +2 assist to advance B hips to [facility], going to R side.            General ADL Comments: Pt received in bed with NT needing to void. Pt struggled with pivotal transfer to [facility]. +2 mod A for percare. Pt then ambulated in the room, about the distance to the bathroom, with some encouragment.      Vision         Perception     Praxis      Pertinent Vitals/Pain Pain Assessment: Faces Faces Pain Scale: Hurts little more Pain Location: incisional Pain Descriptors / Indicators: Sore Pain Intervention(s): Monitored during session;Premedicated before session     Hand Dominance Right   Extremity/Trunk Assessment Upper Extremity Assessment Upper Extremity Assessment: Generalized weakness   Lower Extremity Assessment Lower Extremity Assessment: Defer to PT evaluation       Communication Communication Communication: No difficulties   Cognition Arousal/Alertness: Awake/alert Behavior During Therapy: WFL for tasks assessed/performed Overall Cognitive Status: Impaired/Different from baseline Area of Impairment: Problem solving  Problem Solving: Slow processing;Difficulty sequencing;Requires verbal cues;Requires tactile cues General Comments: WFL for basic mobility tasks but did note slower processing   General Comments  Pt on RA upon OT arrival once onto BSC O2 assessed at 74. Placed on 2L then ulltimately 4L to recover to >90 O2.    Exercises     Shoulder Instructions       Home Living Family/patient expects to be discharged to:: Private residence Living Arrangements: Spouse/significant other;Other relatives Available Help at Discharge: Family Type of Home: House Home Access: Stairs to enter [employer] of Steps: 1   Home Layout: Able to live on main level with bedroom/bathroom     Bathroom Shower/Tub: Occupational psychologist: Handicapped height     Home Equipment: [place] - single point;Toilet riser   Additional Comments: Pt's daughter works for [employer]      Prior Functioning/Environment Level of Independence: Independent                 OT Problem List: Decreased activity tolerance;Impaired balance (sitting and/or standing);Decreased knowledge of use of DME or AE;Decreased knowledge of precautions;Pain      OT Treatment/Interventions: Self-care/ADL training;Therapeutic exercise;DME and/or AE instruction;Therapeutic activities;Patient/family education;Balance training    OT Goals(Current goals can be found in the care plan section) Acute Rehab OT Goals Patient Stated Go[facility]: [facility] OT Goal Formulation: With patient Time For Goal Achievement: 04/26/19 Potential to Achieve Goals: Good ADL Goals Pt Will Perform Grooming: (P) with supervision;standing Pt Will Perform Lower Body Bathing: (P) with min guard assist;sit to/from stand Pt Will Perform Lower Body Dressing: (P) with min guard assist;sit to/from stand Pt Will Transfer to Toilet: (P) with min guard assist;ambulating Pt Will Perform Toileting - Clothing Manipulation and hygiene: (P) with min guard assist;sit to/from stand Pt Will Perform Tub/Shower Transfer: (P) ambulating;with min guard assist;3 in 1;rolling walker Additional ADL Goal #1: (P) Pt will compelte bed mobility at min guard level to prepare for OOB ADLs.  OT Frequency: Min 3X/week   Barriers to D/C:            Co-evaluation PT/OT/SLP Co-Evaluation/Treatment: Yes Reason  for Co-Treatment: For patient/therapist safety;To address functional/ADL transfers   OT goals addressed during session: ADL's and self-care;Strengthening/ROM;Proper use of Adaptive equipment and DME      AM-PAC OT "6 Clicks" Daily Activity     Outcome Measure Help from another person eating meals?: None Help from another person taking care of personal grooming?: A Little Help from another person toileting, which includes using toliet, bedpan, or urinal?: A Lot Help from another person bathing (including washing, rinsing, drying)?: A Lot Help from another person to put on and taking off regular upper body clothing?: A Little Help from another person to put on and taking off regular lower body clothing?: A Lot 6 Click Score: 16   End of Session Equipment Utilized During Treatment: Gait belt;Rolling walker;Oxygen  Activity Tolerance: Patient tolerated treatment well Patient left: in chair;with call bell/phone within reach  OT Visit Diagnosis: Unsteadiness on feet (R26.81);Pain;Muscle weakness (generalized) (M62.81)                Time: 5809-9833 OT Time Calculation (min): 50 min Charges:  OT General Charges $OT Visit: 1 Visit OT Evaluation $OT Eval Low Complexity: 1 Low OT Treatments $Self Care/Home Management : 8-22 mins  Tyrone Schimke, OT Acute Rehabilitation Services Pager: [phone] Office: [phone]   Hortencia Pilar 04/12/2019, 1:48 PM

## 2019-04-12 NOTE — Progress Notes (Signed)
Inpatient Rehabilitation Admissions Coordinator  I met with patient at bedside and spoke with her spouse by phone. I await further abilities to tolerate more therapy before planning admit to inpt rehab. Spouse aware and prefers inpt rehab if able to admit. Noted Hgb 7.4 today.  Danne Baxter, RN, MSN Rehab Admissions Coordinator [phone] 04/12/2019 12:23 PM

## 2019-04-12 NOTE — Progress Notes (Signed)
Physical Therapy Treatment Patient Details Name: Brooke Washington MRN: [id number] DOB: Mar 28, 1941 Today's Date: 04/12/2019    History of Present Illness Pt is a 78 y.o. F with significant PMH of CA, COPD who presents after a fall with a displaced left femoral neck fracture s/p left total hip arthroplasty anterior approach.    PT Comments    Pt seated on commode with OT present on arrival.  PTA assisted patient into standing while OT assisted with pericare.  Pt required moderate assistance to stand, maintain balance, and ambulate.  Pt with flexed posture and required x3 standing breaks.  She is progressing well and continues to benefit from skilled rehab in a post acute setting before returning home.  Plan for progression of activity to tolerance next session.      Follow Up Recommendations  CIR     Equipment Recommendations  Rolling walker with 5" wheels;3in1 (PT)    Recommendations for Other Services Rehab consult;OT consult     Precautions / Restrictions Precautions Precautions: Fall Precaution Comments: watch O2 Restrictions Weight Bearing Restrictions: Yes LLE Weight Bearing: Weight bearing as tolerated    Mobility  Bed Mobility Overal bed mobility: Needs Assistance Bed Mobility: Supine to Sit     Supine to sit: Mod assist;HOB elevated     General bed mobility comments: Pt seated on commode on arrival with OT present  Transfers Overall transfer level: Needs assistance Equipment used: Rolling walker (2 wheeled) Transfers: Sit to/from Stand Sit to Stand: Mod assist;+2 safety/equipment   Squat pivot transfers: Max assist;+2 physical assistance     General transfer comment: Pt required cues for hand placement to push from [facility] to achieve standing.  Once in standing presents with flexed upper trunk anf posterior lean.  moderate assistance to correct.  Ambulation/Gait Ambulation/Gait assistance: Mod assist;+2 safety/equipment Gait Distance (Feet): 12  Feet Assistive device: Rolling walker (2 wheeled) Gait Pattern/deviations: Trunk flexed;Step-to pattern;Decreased step length - right;Decreased step length - left;Decreased stride length;Decreased weight shift to left     General Gait Details: Cues for RW safety, upper trunk control, sequencing and weight shifting.  Pt required close chair follow with max cues for encouragement to progress distance.   Stairs             Wheelchair Mobility    Modified Rankin (Stroke Patients Only)       Balance Overall balance assessment: Needs assistance Sitting-balance support: Feet supported Sitting balance-Leahy Scale: Fair     Standing balance support: Bilateral upper extremity supported Standing balance-Leahy Scale: Poor Standing balance comment: reliant on external support; rw + assist                            Cognition Arousal/Alertness: Awake/alert Behavior During Therapy: WFL for tasks assessed/performed Overall Cognitive Status: Impaired/Different from baseline Area of Impairment: Problem solving                             Problem Solving: Slow processing;Difficulty sequencing;Requires verbal cues;Requires tactile cues General Comments: WFL for basic mobility tasks but did note slower processing      Exercises      General Comments General comments (skin integrity, edema, etc.): Pt on RA upon OT arrival once onto BSC O2 assessed at 74. Placed on 2L then ulltimately 4L to recover to >90 O2.      Pertinent Vitals/Pain Pain Assessment: Faces Faces Pain Scale: Hurts little  more Pain Location: incisional Pain Descriptors / Indicators: Sore;Operative site guarding Pain Intervention(s): Monitored during session;Repositioned    Home Living Family/patient expects to be discharged to:: Private residence Living Arrangements: Spouse/significant other;Other relatives Available Help at Discharge: Family Type of Home: House Home Access: Stairs to  enter   Home Layout: Able to live on main level with bedroom/bathroom Home Equipment: Kasandra Knudsen - single point;Toilet riser Additional Comments: Pt's daughter works for [employer]    Prior Function Level of Independence: Independent          PT Goals (current goals can now be found in the care plan section) Acute Rehab PT Goals Patient Stated [facility] Potential to Achieve Goals: Good Progress towards PT goals: Progressing toward goals    Frequency    Min 5X/week      PT Plan Current plan remains appropriate    Co-evaluation PT/OT/SLP Co-Evaluation/Treatment: Yes Reason for Co-Treatment: Complexity of the patient's impairments (multi-system involvement) PT goals addressed during session: Mobility/safety with mobility OT goals addressed during session: ADL's and self-care;Strengthening/ROM;Proper use of Adaptive equipment and DME      AM-PAC PT "6 Clicks" Mobility   Outcome Measure  Help needed turning from your back to your side while in a flat bed without using bedrails?: A Lot Help needed moving from lying on your back to sitting on the side of a flat bed without using bedrails?: Total Help needed moving to and from a bed to a chair (including a wheelchair)?: Total Help needed standing up from a chair using your arms (e.g., wheelchair or bedside chair)?: Total Help needed to walk in hospital room?: Total Help needed climbing 3-5 steps with a railing? : Total 6 Click Score: 7    End of Session Equipment Utilized During Treatment: Gait belt Activity Tolerance: Patient tolerated treatment well Patient left: in chair;with call bell/phone within reach Nurse Communication: Mobility status PT Visit Diagnosis: Other abnormalities of gait and mobility (R26.89);Muscle weakness (generalized) (M62.81);Difficulty in walking, not elsewhere classified (R26.2);Pain Pain - Right/Left: Left Pain - part of body: Hip     Time: 3704-8889 PT Time Calculation  (min) (ACUTE ONLY): 14 min  Charges:  $Gait Training: 8-22 mins                     Brooke Washington, PTA Acute Rehabilitation Services Pager [phone] Office [phone]     Brooke Washington [name] 04/12/2019, 2:48 PM

## 2019-04-13 ENCOUNTER — Encounter: Payer: Self-pay | Admitting: Hematology and Oncology

## 2019-04-13 LAB — HEMATOLOGY COMMENTS:

## 2019-04-13 LAB — GLUCOSE, CAPILLARY
Glucose-Capillary: 97 mg/dL (ref 70–99)
Glucose-Capillary: 139 mg/dL — ABNORMAL HIGH (ref 70–99)

## 2019-04-13 LAB — CBC
HCT: 21.1 % — ABNORMAL LOW (ref 36.0–46.0)
Hemoglobin: 7 g/dL — ABNORMAL LOW (ref 12.0–15.0)
MCH: 35 pg — ABNORMAL HIGH (ref 26.0–34.0)
MCHC: 33.2 g/dL (ref 30.0–36.0)
MCV: 105.5 fL — ABNORMAL HIGH (ref 80.0–100.0)
Platelets: 127 10*3/uL — ABNORMAL LOW (ref 150–400)
RBC: 2 MIL/uL — ABNORMAL LOW (ref 3.87–5.11)
RDW: 13.9 % (ref 11.5–15.5)
WBC: 4.8 10*3/uL (ref 4.0–10.5)
nRBC: 0 % (ref 0.0–0.2)

## 2019-04-13 LAB — PREPARE RBC (CROSSMATCH)

## 2019-04-13 LAB — FOLATE RBC
Folate, Hemolysate: 295 ng/mL
Folate, RBC: 1460 ng/mL (ref >498)
Hematocrit: 20.2 % — ABNORMAL LOW (ref 34.0–46.6)

## 2019-04-13 LAB — HEMOGLOBIN AND HEMATOCRIT, BLOOD
HCT: 31 % — ABNORMAL LOW (ref 36.0–46.0)
Hemoglobin: 10.7 g/dL — ABNORMAL LOW (ref 12.0–15.0)

## 2019-04-13 MED ORDER — SODIUM CHLORIDE 0.9% IV SOLUTION
Freq: Once | INTRAVENOUS | Status: AC
  Administered 2019-04-13: 09:00:00 via INTRAVENOUS

## 2019-04-13 MED ORDER — SODIUM CHLORIDE 0.9 % IV SOLN
INTRAVENOUS | Status: DC
  Administered 2019-04-13: 09:00:00 via INTRAVENOUS

## 2019-04-13 MED ORDER — FERROUS SULFATE 325 (65 FE) MG PO TABS
325.0000 mg | ORAL_TABLET | Freq: Two times a day (BID) | ORAL | Status: DC
  Administered 2019-04-13 – 2019-04-14 (×3): 325 mg via ORAL
  Filled 2019-04-13 (×3): qty 1

## 2019-04-13 MED ORDER — SODIUM CHLORIDE 0.9% FLUSH: 10.0000 mL | INTRAVENOUS | Status: DC | PRN

## 2019-04-13 MED ORDER — FERROUS SULFATE 325 (65 FE) MG PO TABS: 325.0000 mg | ORAL_TABLET | Freq: Two times a day (BID) | ORAL | 0 refills | Status: DC

## 2019-04-13 NOTE — Progress Notes (Signed)
Inpatient Rehabilitation Admissions Coordinator  I spoke with patient at bedside as well as spouse by phone. They are in agreement to an inpt rehab Saturday pending medical readiness to admit. I spoke with Dr. Nevada Crane today and she is aware of plan. Dr. Delice Lesch will assess patient to ok admit Saturday, 4N RN can contact inpt rehab/CIR Charge RN Saturday at [facility] to verify admission and give report. Call [phone]. I will make the arrangements to admit Saturday.  Danne Baxter, RN, MSN Rehab Admissions Coordinator [phone] 04/13/2019 3:08 PM

## 2019-04-13 NOTE — H&P (Addendum)
Physical Medicine and Rehabilitation Admission H&P    Chief Complaint  Patient presents with  .  Functional decline due to left hip fracture    HPI: Brooke Washington is a 78 year old female with history of COPD and lymphoma who was admitted on 04/10/2019 after losing her balance and sustaining a fall onto left hip with onset of pain.  History taken from chart review and patient.  X-rays done in ED revealing left transcervical proximal femur fracture and patient underwent left total hip arthroplasty with anterior approach by Dr. Lyla Glassing on 03/14/2019.  Postop to be weightbearing as tolerated and on aspirin for DVT prophylaxis.  She has had issues with hypoxia requiring supplemental oxygen as well as issues with hypotension.  Hospital course further complicated by acute blood loss anemia and hematology/oncology recommended holding holding Revlimid.  She received a blood transfusion on 04/13/2019.  Therapy ongoing and patient is noted to have deficits in mobility and ADLs.  She is fatigued from sitting in the chair and asks to return to the bed.  CIR recommended due to functional decline. Please see preadmission assessment from today as well.     Review of Systems  Constitutional: Negative for chills and fever.  HENT: Negative for hearing loss and tinnitus.   Eyes: Negative for double vision.       Vision has gone down due to chemo  Respiratory: Negative for cough and shortness of breath.   Cardiovascular: Negative for chest pain, palpitations and leg swelling.  Gastrointestinal: Positive for constipation. Negative for heartburn and nausea.  Genitourinary: Negative for dysuria and urgency.  Musculoskeletal: Positive for joint pain (left hip pain) and myalgias (left thigh pain).  Skin: Negative for rash.  Neurological: Positive for focal weakness. Negative for dizziness, sensory change, speech change and headaches.  Psychiatric/Behavioral: The patient is not nervous/anxious and does not have  insomnia.   All other systems reviewed and are negative.    Past Medical History:  Diagnosis Date  . Cancer ([place])  APRIL OF 2000   LOW-GRADE NON-HODGKIN'S LYMPHOMA  . COPD (chronic obstructive pulmonary disease) ([place])   . Depression   . Emphysema of lung ([place])   . GERD (gastroesophageal reflux disease)   . Hyperlipidemia     Past Surgical History:  Procedure Laterality Date  . 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 IR FLUORO GUIDE PORT INSERTION RIGHT  12/07/2017  . IR US GUIDE VASC ACCESS RIGHT  12/07/2017  . ORIF HUMERUS FRACTURE Right 11/23/2017   Procedure: OPEN REDUCTION INTERNAL FIXATION (ORIF) HUMERAL SHAFT FRACTURE;  Surgeon: Shona Needles, MD;  Location: [facility];  Service: Orthopedics;  Laterality: Right;  . RIGHT OOPHORECTOMY  1976  . 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   Procedure: TOTAL HIP ARTHROPLASTY ANTERIOR APPROACH LEFT HIP;  Surgeon: Rod Can, MD;  Location: [facility];  Service: Orthopedics;  Laterality: Left;    Family History  Problem Relation Age of Onset  . Cancer Mother        colon ca    Social History: Married.  Independent prior to admission.  Reports that she quit smoking about 4 years ago. Her smoking use included cigarettes. She has a  25 pack-year smoking history. She has never used smokeless tobacco. She reports that she drinks alcohol once a month. She reports that she does not use drugs.   Allergies  Allergen Reactions  . Contrast Media [Iodinated Diagnostic  Agents] Shortness Of Breath, Rash and Other (See Comments)    Throat swells  . Iohexol Shortness Of Breath  . Povidone-Iodine Hives, Shortness Of Breath and Rash    REACTION: throat swells  . Penicillins Hives, Itching and Rash    Has patient had a PCN reaction causing immediate rash, facial/tongue/throat swelling, SOB or lightheadedness with hypotension: Unknown Has patient had a PCN reaction causing severe  rash involving mucus membranes or skin necrosis: Yes Has patient had a PCN reaction that required hospitalization: Unknown Has patient had a PCN reaction occurring within the last 10 years: Unknown If all of the above answers are "NO", then may proceed with Cephalosporin use.   . Adhesive [Tape] Hives    Medications Prior to Admission  Medication Sig Dispense Refill  . albuterol (PROVENTIL HFA;VENTOLIN HFA) 108 (90 Base) MCG/ACT inhaler Inhale 2 puffs into the lungs every 6 (six) hours as needed for wheezing or shortness of breath. 1 Inhaler 0  . aspirin 81 MG tablet Take 81 mg by mouth daily.    Marland Kitchen BREO ELLIPTA 100-25 MCG/INH AEPB ONE INHALTION INTO LUNGS EVERY DAY (Patient taking differently: Inhale 1 puff into the lungs every other day. ) 60 each 6  . cholecalciferol (VITAMIN D) 1000 units tablet Take 2,000 Units by mouth daily.    . citalopram (CELEXA) 40 MG tablet TAKE 1 TABLET BY MOUTH EVERY DAY (Patient taking differently: Take 40 mg by mouth daily. ) 90 tablet 1  . lenalidomide (REVLIMID) 10 MG capsule Take 1 capsule daily for 7 days and then take 7 days off. Repeat every 2 weeks for cycle of every 28 days 14 capsule 11  . acetaminophen (TYLENOL) 325 MG tablet Take 2 tablets (650 mg total) by mouth every 6 (six) hours as needed for mild pain (or Fever >/= 101).    . magic mouthwash w/lidocaine SOLN Take 5 mLs by mouth 4 (four) times daily. (Patient not taking: Reported on 04/10/2019) 240 mL 0  . pantoprazole (PROTONIX) 40 MG tablet Take 1 tablet (40 mg total) by mouth daily. (Patient not taking: Reported on 04/10/2019) 180 tablet 3    Drug Regimen Review  Drug regimen was reviewed and remains appropriate with no significant issues identified  Home: Home Living Family/patient expects to be discharged to:: Private residence Living Arrangements: Spouse/significant other, Other relatives Available Help at Discharge: Family, Available 24 hours/day Type of Home: House Home Access: Level  entry Entrance Stairs-Number of Steps: 1 Home Layout: Two level, Able to live on main level with bedroom/bathroom, Full bath on main level Bathroom Shower/Tub: Walk-in shower, Tub/shower unit(have both available on first level) Biochemist, clinical: Handicapped height Bathroom Accessibility: Yes Home Equipment: [place] - single point, Toilet riser Additional Comments: Pt's daughter works for [employer]  Lives With: Spouse(84 year old grand daughter live with her grandparents)   Functional History: Prior Function Level of Independence: Independent Comments: Reports used to work out regularly with husband at [employer] prior to [employer]  Functional Status:  Mobility: Bed Mobility Overal bed mobility: Needs Assistance Bed Mobility: Supine to Sit Supine to sit: Mod assist, HOB elevated General bed mobility comments: Able to initiate bringing LLE to EOB but needed assist; assist with bottom and trunk. Transfers Overall transfer level: Needs assistance Equipment used: Rolling walker (2 wheeled) Transfers: Sit to/from Stand Sit to Stand: Mod assist, +2 physical assistance Squat pivot transfers: Max assist, +2 physical assistance General transfer comment: Assist of 2 to power to standing from EOB x1. Cues  for hand placement/technique. Cues for upright posture. Favors placing weight through RLE. Transferred to chair post ambulation. Ambulation/Gait Ambulation/Gait assistance: Min assist, +2 safety/equipment Gait Distance (Feet): 20 Feet Assistive device: Rolling walker (2 wheeled) Gait Pattern/deviations: Trunk flexed, Step-to pattern, Decreased step length - right, Decreased step length - left, Decreased stride length, Decreased weight shift to left, Step-through pattern General Gait Details: Slow, mildly unsteady gait with RW for support; cues for RW proximity/management. Assist with pushing RW forward and cues for upright posture. SP02 dropped to 86% on 2L/min 02. HR up to 124 bpm. Gait  velocity: decreased    ADL: ADL Overall ADL's : Needs assistance/impaired Toilet Transfer: Squat-pivot, Maximal assistance, Moderate assistance, +2 for physical assistance Toilet Transfer Details (indicate cue type and reason): max difficulty initiating pivotal steps. ultimately max +2 assist to advance B hips to [facility], going to R side.  General ADL Comments: Pt received in bed with NT needing to void. Pt struggled with pivotal transfer to [facility] +2 mod A for percare. Pt then ambulated in the room, about the distance to the bathroom, with some encouragment.   Cognition: Cognition Overall Cognitive Status: Within Functional Limits for tasks assessed Orientation Level: Oriented X4 Cognition Arousal/Alertness: Awake/alert Behavior During Therapy: WFL for tasks assessed/performed Overall Cognitive Status: Within Functional Limits for tasks assessed Area of Impairment: Problem solving Problem Solving: Slow processing, Difficulty sequencing, Requires verbal cues, Requires tactile cues General Comments: WFL for basic mobility tasks but did note slower processing   Blood pressure (!) 123/52, pulse 76, temperature 98 F (36.7 C), temperature source Oral, resp. rate 16, height 5\' 4"  (1.626 m), weight 52.2 kg, SpO2 93 %. Physical Exam  Nursing note and vitals reviewed. Constitutional: She is oriented to person, place, and time. She appears well-developed and well-nourished.  HENT:  Head: Atraumatic.  Eyes: EOM are normal. Right eye exhibits no discharge. Left eye exhibits no discharge.  Respiratory: Effort normal. No respiratory distress.  + [place]  GI: She exhibits no distension.  Musculoskeletal:     Comments: Left hip with edema and tenderness  Neurological: She is alert and oriented to person, place, and time.  Motor: Bilateral upper extremities: 5/5 proximal to distal Right lower extremity: Hip flexion 4--4/5, knee extension 4+/5, ankle dorsiflexion 5/5 Left lower extremity: Hip flexion  4-/5, knee extension 4/5, and dorsiflexion 5/5 Sensation intact light touch  Skin:  Petechial lesions left thigh. Surgical dressing left hip C/D/I.   Psychiatric: She has a normal mood and affect. Her behavior is normal.    Results for orders placed or performed during the hospital encounter of 04/09/19 (from the past 48 hour(s))  Iron and TIBC     Status: Abnormal   Collection Time: 04/12/19  6:33 PM  Result Value Ref Range   Iron 14 (L) 28 - 170 ug/dL   TIBC 253 250 - 450 ug/dL   Saturation Ratios 6 (L) 10.4 - 31.8 %   UIBC 239 ug/dL    Comment: Performed at [facility] [address], [place], [place] [address]  Ferritin     Status: None   Collection Time: 04/12/19  6:33 PM  Result Value Ref Range   Ferritin 135 11 - 307 ng/mL    Comment: Performed at [facility], [place] [address], [place], [place] [address]  Reticulocytes     Status: Abnormal   Collection Time: 04/12/19  6:33 PM  Result Value Ref Range   Retic Ct Pct 2.0 0.4 - 3.1 %  RBC. 2.17 (L) 3.87 - 5.11 MIL/uL   Retic Count, Absolute 42.7 19.0 - 186.0 K/uL   Immature Retic Fract 8.1 2.3 - 15.9 %    Comment: Performed at [facility] [address], [place], [place] [address]  Folate RBC     Status: Abnormal   Collection Time: 04/12/19  6:33 PM  Result Value Ref Range   Folate, Hemolysate 295.0 Not Estab. ng/mL   Hematocrit 20.2 (L) 34.0 - 46.6 %   Folate, RBC 1,460 >498 ng/mL    Comment: (NOTE) Performed At: [facility] [place], [place] [id number] Rush Farmer MD OF:[id number]   Vitamin B12     Status: Abnormal   Collection Time: 04/12/19  6:33 PM  Result Value Ref Range   Vitamin B-12 94 (L) 180 - 914 pg/mL    Comment: (NOTE) This assay is not validated for testing neonatal or myeloproliferative syndrome specimens for Vitamin B12 levels. Performed at [facility], [place] [address], [place], [place] [address]   Save Smear     Status: None   Collection  Time: 04/12/19  6:33 PM  Result Value Ref Range   Smear Review SMEAR STAINED AND AVAILABLE FOR REVIEW     Comment: Performed at [facility], [place] [address] [place], [place] [address]  Hematology Comments:     Status: None   Collection Time: 04/12/19  6:33 PM  Result Value Ref Range   Hematology Comments: Note:     Comment: (NOTE) Verified by microscopic examination. Performed At: [facility] [place], [place] [id number] Rush Farmer MD QP:[id number]   Glucose, capillary     Status: Abnormal   Collection Time: 04/12/19  8:56 PM  Result Value Ref Range   Glucose-Capillary 109 (H) 70 - 99 mg/dL  CBC     Status: Abnormal   Collection Time: 04/13/19  5:57 AM  Result Value Ref Range   WBC 4.8 4.0 - 10.5 K/uL   RBC 2.00 (L) 3.87 - 5.11 MIL/uL   Hemoglobin 7.0 (L) 12.0 - 15.0 g/dL   HCT 21.1 (L) 36.0 - 46.0 %   MCV 105.5 (H) 80.0 - 100.0 fL   MCH 35.0 (H) 26.0 - 34.0 pg   MCHC 33.2 30.0 - 36.0 g/dL   RDW 13.9 11.5 - 15.5 %   Platelets 127 (L) 150 - 400 K/uL   nRBC 0.0 0.0 - 0.2 %    Comment: Performed at [facility], [place] [address] [place], [place] [address]  Glucose, capillary     Status: None   Collection Time: 04/13/19  7:42 AM  Result Value Ref Range   Glucose-Capillary 97 70 - 99 mg/dL  Prepare RBC     Status: None   Collection Time: 04/13/19  7:53 AM  Result Value Ref Range   Order Confirmation      ORDER PROCESSED BY BLOOD BANK Performed at [facility], [place] [address] [place], [place] [address]   Glucose, capillary     Status: Abnormal   Collection Time: 04/13/19 12:52 PM  Result Value Ref Range   Glucose-Capillary 139 (H) 70 - 99 mg/dL  Hemoglobin and hematocrit, blood     Status: Abnormal   Collection Time: 04/13/19  8:55 PM  Result Value Ref Range   Hemoglobin 10.7 (L) 12.0 - 15.0 g/dL    Comment: POST TRANSFUSION SPECIMEN   HCT 31.0 (L) 36.0 - 46.0 %    Comment:  Performed at [facility], [place] [address] [place], [place] [address]   No results found.     Medical Problem List and Plan: 1.  Deficits with mobility, transfers, self-care secondary to left hip fracture status post left THA on 04/10/2019.  Admit to CIR 2.  Antithrombotics: -DVT/anticoagulation:  Pharmaceutical: Lovenox  -antiplatelet therapy: ASA for DVT prophylaxis after discharge 3. Pain Management: Hydrocodone as needed 4. Mood: LCSW to follow for evaluation and support  -antipsychotic agents: N/A 5. Neuropsych: This patient is capable of making decisions on her own behalf. 6. Skin/Wound Care: Monitor wound for healing.  Nutritional supplements added. 7. Fluids/Electrolytes/Nutrition: Monitor I/O.  BMP ordered for Monday. 8. Hypoxic respiratory failure: COPD per films--encourage IS.  Wean supplemental oxygen as tolerated. 9. ABLA: Status post transfusion on 04/13/2019.  Continue iron supplement.  CBC ordered for Monday. 10. Persistent Hypotension: Likely due to anemia--transfused with 2 units packed red blood cells on 7/3.  Monitor with increased mobility. 47. H/o lymphoma: To hold Revlimid for now.  Follow-up with heme-onc for resumption after discharge 12. Constipation: Continue bowel regimen.  Bary Leriche, PA-C 04/14/2019

## 2019-04-13 NOTE — Progress Notes (Addendum)
Inpatient Rehabilitation Admissions Coordinator  I have notified Dr. Nevada Crane of Hgb & Hct 21.1. Will follow up today for planning inpt rehab med pending medical readiness. Discussed with Dr. Delice Lesch. Discussed with Dr. Nevada Crane by phone. Hopeful to admit Saturday. I will discuss with spouse and pt today.  Danne Baxter, RN, MSN Rehab Admissions Coordinator [phone] 04/13/2019 7:49 AM

## 2019-04-13 NOTE — Progress Notes (Signed)
Physical Therapy Treatment Patient Details Name: Brooke Washington MRN: [id number] DOB: 13-Sep-1941 Today's Date: 04/13/2019    History of Present Illness Pt is a 78 y.o. F with significant PMH of CA, COPD who presents after a fall with a displaced left femoral neck fracture s/p left total hip arthroplasty anterior approach.    PT Comments    Patient progressing slowly towards PT goals. Pt not feeling great today and found to have low hemoglobin awaiting blood transfusion but still agreeable to PT. Focused on standing trials from EOB and from [facility] with Mod A of 2 and cues for upright posture. Sp02 dropped to 82% on 2.5L/min 02 and HR up to 129 bpm. Requires cues for pursed lip breathing. Ambulation deferred due to above. Eager to get to rehab. Will follow.   Follow Up Recommendations  CIR     Equipment Recommendations  Rolling walker with 5" wheels;3in1 (PT)    Recommendations for Other Services       Precautions / Restrictions Precautions Precautions: Fall Precaution Comments: watch O2 Restrictions Weight Bearing Restrictions: Yes LLE Weight Bearing: Weight bearing as tolerated    Mobility  Bed Mobility Overal bed mobility: Needs Assistance Bed Mobility: Supine to Sit     Supine to sit: Max assist;HOB elevated     General bed mobility comments: Pt reaching for therapist to assist with pulling trunk up to get to EOB. Max A with trunk, bottom and LEs to get to EOB. + dizziness.  Transfers Overall transfer level: Needs assistance Equipment used: Rolling walker (2 wheeled) Transfers: Sit to/from Stand Sit to Stand: Mod assist;+2 safety/equipment         General transfer comment: Assist of 2 to power to standing from EOB x2, from [facility] x1. Cues for hand placement/technique. Cues for upright posture. Favors placing weight through RLE.  Ambulation/Gait             General Gait Details: Deferred secondary to low Hemoglobin and pt politely decling.   Stairs              Wheelchair Mobility    Modified Rankin (Stroke Patients Only)       Balance Overall balance assessment: Needs assistance Sitting-balance support: Feet supported;Bilateral upper extremity supported Sitting balance-Leahy Scale: Fair     Standing balance support: During functional activity Standing balance-Leahy Scale: Poor Standing balance comment: reliant on external support and BUEs on RW.                            Cognition Arousal/Alertness: Awake/alert Behavior During Therapy: WFL for tasks assessed/performed Overall Cognitive Status: Within Functional Limits for tasks assessed                                        Exercises General Exercises - Lower Extremity Ankle Circles/Pumps: Both;10 reps;Supine Long Arc Quad: AROM;Both;5 reps;Seated    General Comments General comments (skin integrity, edema, etc.): Sp02 dropped to 82% on 2.5L/min 02; HR up to 129 bpm      Pertinent Vitals/Pain Pain Assessment: Faces Faces Pain Scale: Hurts whole lot Pain Location: incisional Pain Descriptors / Indicators: Sore;Operative site guarding;Burning Pain Intervention(s): Repositioned;Monitored during session;Limited activity within patient's tolerance    Home Living                      Prior Function  PT Goals (current goals can now be found in the care plan section) Progress towards PT goals: Progressing toward goals    Frequency    Min 5X/week      PT Plan Current plan remains appropriate    Co-evaluation              AM-PAC PT "6 Clicks" Mobility   Outcome Measure  Help needed turning from your back to your side while in a flat bed without using bedrails?: A Lot Help needed moving from lying on your back to sitting on the side of a flat bed without using bedrails?: Total Help needed moving to and from a bed to a chair (including a wheelchair)?: Total Help needed standing up from a chair using  your arms (e.g., wheelchair or bedside chair)?: Total Help needed to walk in hospital room?: Total Help needed climbing 3-5 steps with a railing? : Total 6 Click Score: 7    End of Session Equipment Utilized During Treatment: Gait belt Activity Tolerance: Patient limited by fatigue Patient left: in bed;with call bell/phone within reach;with bed alarm set Nurse Communication: Mobility status PT Visit Diagnosis: Other abnormalities of gait and mobility (R26.89);Muscle weakness (generalized) (M62.81);Difficulty in walking, not elsewhere classified (R26.2);Pain Pain - Right/Left: Left Pain - part of body: Hip     Time: 5859-2924 PT Time Calculation (min) (ACUTE ONLY): 33 min  Charges:  $Therapeutic Activity: 23-37 mins                     Wray Kearns, [place], DPT Acute Rehabilitation Services Pager [phone] Office [place] 04/13/2019, 10:54 AM

## 2019-04-13 NOTE — Progress Notes (Signed)
    Subjective:  Patient reports pain as mild.  Denies N/V/CP/SOB.  Objective:   VITALS:   Vitals:   04/12/19 1628 04/12/19 1630 04/12/19 2033 04/13/19 0725  BP:  (!) 94/51 (!) 96/52 96/64  Pulse:  86  (!) 110  Resp:  15  16  Temp: 97.6 F (36.4 C) 97.6 F (36.4 C) 98.2 F (36.8 C) 98.5 F (36.9 C)  TempSrc: Oral Oral Oral Oral  SpO2:  100%  90%  Weight:      Height:        NAD ABD soft Sensation intact distally Intact pulses distally Dorsiflexion/Plantar flexion intact Incision: dressing C/D/I Compartment soft   Lab Results  Component Value Date   WBC 4.8 04/13/2019   HGB 7.0 (L) 04/13/2019   HCT 21.1 (L) 04/13/2019   MCV 105.5 (H) 04/13/2019   PLT 127 (L) 04/13/2019   BMET    Component Value Date/Time   NA 140 04/12/2019 0550   NA 142 12/28/2016 1002   K 3.6 04/12/2019 0550   K 4.6 12/28/2016 1002   CL 104 04/12/2019 0550   CL 101 03/28/2013 0826   CO2 29 04/12/2019 0550   CO2 29 12/28/2016 1002   GLUCOSE 95 04/12/2019 0550   GLUCOSE 85 12/28/2016 1002   GLUCOSE 84 03/28/2013 0826   BUN 17 04/12/2019 0550   BUN 20.3 12/28/2016 1002   CREATININE 0.89 04/12/2019 0550   CREATININE 0.75 04/09/2019 0905   CREATININE 0.9 12/28/2016 1002   CALCIUM 8.7 (L) 04/12/2019 0550   CALCIUM 10.1 12/28/2016 1002   GFRNONAA >60 04/12/2019 0550   GFRNONAA >60 04/09/2019 0905   GFRAA >60 04/12/2019 0550   GFRAA >60 04/09/2019 0905     Assessment/Plan: 3 Days Post-Op   Principal Problem:   Closed left hip fracture, initial encounter ([place]) Active Problems:   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

## 2019-04-13 NOTE — Progress Notes (Signed)
PROGRESS NOTE  Brooke Washington AUQ:[id number] DOB: 04-10-1941 DOA: 04/09/2019 PCP: [name], NP  HPI/Recap of past 24 hours: Patient is a 78 year old female with history of lymphoma on Revlimid, COPD who presents from home after a mechanical fall. X-ray done in the emergency department showed left transcervical proximal femur fracture. She is usually ambulatory at home and ambulates without the help of walker or cane. She lives with her husband. Orthopedics consulted and she underwent left total hip arthroplasty.  Last night she became hypotensive and hypoxic after surgery.  Currently hemodynamically stable.  Physical therapy recommended her to go to CIR on discharge.  04/13/19: Patient seen and examined at her bedside.  She is alert and oriented x4.  She denies any pain at this time with no movement.  Hemoglobin dropped acutely this morning from 7.4 to 7.0.  She has a baseline hemoglobin of 13.0 as of 04/10/2019.  Will transfuse 2 unit PRBCs.  FOBT is pending with no bowel movements.  No sign of worsening edema at surgical site.  Plan is to transfer to inpatient rehab once her hemoglobin is stable.  Assessment/Plan: Principal Problem:   Closed left hip fracture, initial encounter [facility]) Active Problems:   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[facility])   COPD GOLD II  Left hip fracture secondary to mechanical fall POD #3 post left total hip arthroplasty on 04/10/2019 by Dr. Lyla Glassing. Left total hip arthroplasty done on 04/10/2019 by Dr. Lyla Glassing Continue PT OT as recommended by orthopedic surgery On subcu Lovenox for DVT prophylaxis PT OT evaluated and recommended CIR CIR has been consulted and following Pain management in place as needed WBAT with walker DVT ppx: Lovenox in house --> d/c on ASA 81 mg PO BID x 6 weeks, SCDs, TEDS  Acute blood loss anemia post surgery Baseline hemoglobin 13 Hemoglobin dropped acutely this morning to 7.0 2 unit PRBC ordered to be transfused. Iron  studies revealed significant iron deficiency Started on iron supplement this morning 325 mg twice daily FOBT pending, no bowel movements Continue to closely monitor H&H If no improvement of hemoglobin consider obtaining imaging of her left hip  Iron deficiency anemia Management as stated above Will need to follow-up with oncology  Acute hypoxic respiratory failure in the setting of COPD, unclear etiology Independently reviewed chest x-ray done on admission showed no active pulmonary disease. Independently reviewed chest x-ray done on 04/12/2019 which showed no active pulmonary disease.  No lobular infiltrates.  Hyperinflated lungs.   Patient denies O2 supplementation at baseline but states she has been on oxygen supplementation after her right arm surgery 5 years ago then was weaned off oxygen supplementation. Continue to maintain O2 saturation greater than 92% Wean off oxygen as tolerated Currently on 2 L of oxygen via nasal cannula and saturating 96% improved from yesterday when she was requiring 4 L to maintain sats greater than 90% Home O2 evaluation prior to discharge to CIR.  Ambulatory dysfunction post left hip repair PT OT assessed and recommended CIR CIR following, possible discharge to CIR tomorrow 04/14/2019 if hemoglobin remains stable. Continue PT as recommended by orthopedic surgery Weightbearing as tolerated with walker  Hypotension with no prior history of hypertension Not on antihypertensive medications Blood pressure is improving with IV fluid hydration normal saline at 75 cc/h Maintain map greater than 28  History of lymphoma Follows with oncology outpatient On Revlimid, hold per oncology    DVT prophylaxis: Lovenox subcu daily Code Status: Full Family Communication:  None at bedside.  Disposition Plan:  Possible discharge tomorrow 04/14/2019 to CIR if hemodynamically stable. Consultants: Orthopedics, CIR  Procedures:  Left hip arthroplasty    Objective:  Vitals:   04/12/19 1628 04/12/19 1630 04/12/19 2033 04/13/19 0725  BP:  (!) 94/51 (!) 96/52 96/64  Pulse:  86  (!) 110  Resp:  15  16  Temp: 97.6 F (36.4 C) 97.6 F (36.4 C) 98.2 F (36.8 C) 98.5 F (36.9 C)  TempSrc: Oral Oral Oral Oral  SpO2:  100%  90%  Weight:      Height:        Intake/Output Summary (Last 24 hours) at 04/13/2019 0758 Last data filed at 04/12/2019 1330 Gross per 24 hour  Intake 120 ml  Output 600 ml  Net -480 ml   Filed Weights   04/09/19 2326 04/10/19 1614  Weight: 52.2 kg 52.2 kg    Exam:  . General: 78 y.o. year-old female well-developed well-nourished in no acute distress.  She is alert and oriented x4.   . Cardiovascular: Good rate and rhythm with no rubs or gallops no JVD or thyromegaly noted.   Marland Kitchen Respiratory: Clear to auscultation with no wheezes or rales.  Poor inspiratory effort. . Abdomen: Soft nontender nondistended with normal bowel sounds x4.. . Musculoskeletal: No lower extremity edema.  2 out of 4 pulses in all 4 extremities.  Mild bruising noted on left hip.  Petechiae noted in left thigh.   . Psychiatry: Mood is appropriate for condition and setting.  Data Reviewed: CBC: Recent Labs  Lab 04/09/19 0905 04/09/19 2339 04/10/19 0620 04/11/19 0437 04/12/19 0550 04/13/19 0557  WBC 5.3 5.8 8.2 5.0 5.3 4.8  NEUTROABS 2.5 3.6 5.9 4.1  --   --   HGB 13.2 12.9 13.0 8.4* 7.4* 7.0*  HCT 39.4 38.3 38.4 25.4* 22.7* 21.1*  MCV 101.0* 103.2* 101.3* 104.1* 103.7* 105.5*  PLT 218 203 207 143* 123* 161*   Basic Metabolic Panel: Recent Labs  Lab 04/09/19 0905 04/09/19 2339 04/10/19 0620 04/11/19 0437 04/12/19 0550  NA 142 141 138 139 140  K 3.7 3.6 3.8 4.1 3.6  CL 103 106 102 102 104  CO2 28 24 27 29 29   GLUCOSE 82 108* 124* 160* 95  BUN 10 12 10 14 17   CREATININE 0.75 0.62 0.82 0.98 0.89  CALCIUM 9.2 8.5* 8.6* 8.7* 8.7*   GFR: Estimated Creatinine Clearance: 42.9 mL/min (by C-G formula based on SCr of 0.89 mg/dL). Liver  Function Tests: Recent Labs  Lab 04/09/19 0905 04/10/19 0620  AST 20 24  ALT 12 16  ALKPHOS 124 105  BILITOT 0.6 1.1  PROT 6.1* 5.7*  ALBUMIN 4.0 3.9   No results for input(s): LIPASE, AMYLASE in the last 168 hours. No results for input(s): AMMONIA in the last 168 hours. Coagulation Profile: Recent Labs  Lab 04/09/19 2339  INR 0.9   Cardiac Enzymes: No results for input(s): CKTOTAL, CKMB, CKMBINDEX, TROPONINI in the last 168 hours. BNP (last 3 results) No results for input(s): PROBNP in the last 8760 hours. HbA1C: No results for input(s): HGBA1C in the last 72 hours. CBG: Recent Labs  Lab 04/11/19 2351 04/12/19 0627 04/12/19 1206 04/12/19 2056 04/13/19 0742  GLUCAP 96 94 90 109* 97   Lipid Profile: No results for input(s): CHOL, HDL, LDLCALC, TRIG, CHOLHDL, LDLDIRECT in the last 72 hours. Thyroid Function Tests: No results for input(s): TSH, T4TOTAL, FREET4, T3FREE, THYROIDAB in the last 72 hours. Anemia Panel: Recent Labs    04/12/19 1833  VITAMINB12 94*  FERRITIN 135  TIBC 253  IRON 14*  RETICCTPCT 2.0   Urine analysis:    Component Value Date/Time   COLORURINE YELLOW 12/17/2017 2213   APPEARANCEUR CLEAR 12/17/2017 2213   LABSPEC 1.017 12/17/2017 2213   PHURINE 7.0 12/17/2017 2213   GLUCOSEU NEGATIVE 12/17/2017 2213   HGBUR NEGATIVE 12/17/2017 2213   BILIRUBINUR NEGATIVE 12/17/2017 2213   BILIRUBINUR n 04/30/2016 1055   KETONESUR NEGATIVE 12/17/2017 2213   PROTEINUR 30 (A) 12/17/2017 2213   UROBILINOGEN 0.2 04/30/2016 1055   UROBILINOGEN 0.2 01/28/2009 1035   NITRITE NEGATIVE 12/17/2017 2213   LEUKOCYTESUR NEGATIVE 12/17/2017 2213   Sepsis Labs: @LABRCNTIP (procalcitonin:4,lacticidven:4)  ) Recent Results (from the past 240 hour(s))  SARS Coronavirus 2 (CEPHEID - Performed in [place] hospital lab), Hosp Order     Status: None   Collection Time: 04/10/19  1:17 AM   Specimen: Nasopharyngeal Swab  Result Value Ref Range Status   SARS  Coronavirus 2 NEGATIVE NEGATIVE Final    Comment: (NOTE) If result is NEGATIVE SARS-CoV-2 target nucleic acids are NOT DETECTED. The SARS-CoV-2 RNA is generally detectable in upper and lower  respiratory specimens during the acute phase of infection. The lowest  concentration of SARS-CoV-2 viral copies this assay can detect is 250  copies / mL. A negative result does not preclude SARS-CoV-2 infection  and should not be used as the sole basis for treatment or other  patient management decisions.  A negative result may occur with  improper specimen collection / handling, submission of specimen other  than nasopharyngeal swab, presence of viral mutation(s) within the  areas targeted by this assay, and inadequate number of viral copies  (<250 copies / mL). A negative result must be combined with clinical  observations, patient history, and epidemiological information. If result is POSITIVE SARS-CoV-2 target nucleic acids are DETECTED. The SARS-CoV-2 RNA is generally detectable in upper and lower  respiratory specimens dur ing the acute phase of infection.  Positive  results are indicative of active infection with SARS-CoV-2.  Clinical  correlation with patient history and other diagnostic information is  necessary to determine patient infection status.  Positive results do  not rule out bacterial infection or co-infection with other viruses. If result is PRESUMPTIVE POSTIVE SARS-CoV-2 nucleic acids MAY BE PRESENT.   A presumptive positive result was obtained on the submitted specimen  and confirmed on repeat testing.  While 2019 novel coronavirus  (SARS-CoV-2) nucleic acids may be present in the submitted sample  additional confirmatory testing may be necessary for epidemiological  and / or clinical management purposes  to differentiate between  SARS-CoV-2 and other Sarbecovirus currently known to infect humans.  If clinically indicated additional testing with an alternate test   methodology [phone]) is advised. The SARS-CoV-2 RNA is generally  detectable in upper and lower respiratory sp ecimens during the acute  phase of infection. The expected result is Negative. Fact Sheet for Patients:  StrictlyIdeas.no Fact Sheet for Healthcare Providers: BankingDealers.co.za This test is not yet approved or cleared by the [place] FDA and has been authorized for detection and/or diagnosis of SARS-CoV-2 by FDA under an Emergency Use Authorization (EUA).  This EUA will remain in effect (meaning this test can be used) for the duration of the COVID-19 declaration under Section 564(b)(1) of the Act, 21 U.S.C. section 360bbb-3(b)(1), unless the authorization is terminated or revoked sooner. Performed at [facility], [place] [address] [place], [place] [address]   Surgical pcr screen  Status: None   Collection Time: 04/10/19 10:15 AM   Specimen: Nasal Mucosa; Nasal Swab  Result Value Ref Range Status   MRSA, PCR NEGATIVE NEGATIVE Final   Staphylococcus aureus NEGATIVE NEGATIVE Final    Comment: (NOTE) The Xpert SA Assay (FDA approved for NASAL specimens in patients 23 years of age and older), is one component of a comprehensive surveillance program. It is not intended to diagnose infection nor to guide or monitor treatment. Performed at [facility], [place] [address], [place], [place] [address]       Studies: Dg Chest Port 1 View  Result Date: 04/12/2019 CLINICAL DATA:  Hypoxia EXAM: PORTABLE CHEST 1 VIEW COMPARISON:  04/10/2019 FINDINGS: Cardiac shadow is stable. Right chest wall port is again seen. Aortic calcifications are noted. The lungs are hyperinflated consistent with COPD. No focal infiltrate is noted. No bony abnormality is seen. IMPRESSION: COPD without acute abnormality. Electronically Signed   By: Inez Catalina M.D.   On: 04/12/2019 09:43    Scheduled Meds: . sodium chloride    Intravenous Once  . docusate sodium  100 mg Oral BID  . enoxaparin (LOVENOX) injection  40 mg Subcutaneous Q24H  . feeding supplement (ENSURE ENLIVE)  237 mL Oral BID BM  . ferrous sulfate  325 mg Oral BID WC  . senna  1 tablet Oral BID    Continuous Infusions: . sodium chloride       LOS: 3 days     Kayleen Memos, MD [facility] Pager [phone]  If 7PM-7AM, please contact night-coverage [email or web address] Password [email or web address] 04/13/2019, 7:58 AM

## 2019-04-14 ENCOUNTER — Other Ambulatory Visit: Payer: Self-pay

## 2019-04-14 ENCOUNTER — Encounter (HOSPITAL_COMMUNITY): Payer: Self-pay

## 2019-04-14 ENCOUNTER — Inpatient Hospital Stay (HOSPITAL_COMMUNITY)
Admission: RE | Admit: 2019-04-14 | Discharge: 2019-04-25 | DRG: 559 | Disposition: A | Discharge status: Home / Self Care | Payer: Medicare Other | Source: Intra-hospital | Attending: Physical Medicine & Rehabilitation | Admitting: Physical Medicine & Rehabilitation

## 2019-04-14 DIAGNOSIS — Z880 Allergy status to penicillin: Secondary | ICD-10-CM

## 2019-04-14 DIAGNOSIS — K219 Gastro-esophageal reflux disease without esophagitis: Secondary | ICD-10-CM | POA: Diagnosis present

## 2019-04-14 DIAGNOSIS — R339 Retention of urine, unspecified: Secondary | ICD-10-CM | POA: Diagnosis not present

## 2019-04-14 DIAGNOSIS — S7292XA Unspecified fracture of left femur, initial encounter for closed fracture: Secondary | ICD-10-CM | POA: Insufficient documentation

## 2019-04-14 DIAGNOSIS — Z8781 Personal history of (healed) traumatic fracture: Secondary | ICD-10-CM | POA: Diagnosis not present

## 2019-04-14 DIAGNOSIS — Z887 Allergy status to serum and vaccine status: Secondary | ICD-10-CM | POA: Diagnosis not present

## 2019-04-14 DIAGNOSIS — K5900 Constipation, unspecified: Secondary | ICD-10-CM | POA: Diagnosis not present

## 2019-04-14 DIAGNOSIS — Z90721 Acquired absence of ovaries, unilateral: Secondary | ICD-10-CM

## 2019-04-14 DIAGNOSIS — Z91041 Radiographic dye allergy status: Secondary | ICD-10-CM

## 2019-04-14 DIAGNOSIS — Z79899 Other long term (current) drug therapy: Secondary | ICD-10-CM

## 2019-04-14 DIAGNOSIS — I959 Hypotension, unspecified: Secondary | ICD-10-CM | POA: Diagnosis present

## 2019-04-14 DIAGNOSIS — D62 Acute posthemorrhagic anemia: Secondary | ICD-10-CM | POA: Diagnosis not present

## 2019-04-14 DIAGNOSIS — B9620 Unspecified Escherichia coli [E. coli] as the cause of diseases classified elsewhere: Secondary | ICD-10-CM | POA: Diagnosis not present

## 2019-04-14 DIAGNOSIS — W1839XD Other fall on same level, subsequent encounter: Secondary | ICD-10-CM | POA: Diagnosis not present

## 2019-04-14 DIAGNOSIS — Z87891 Personal history of nicotine dependence: Secondary | ICD-10-CM

## 2019-04-14 DIAGNOSIS — Z7982 Long term (current) use of aspirin: Secondary | ICD-10-CM | POA: Diagnosis not present

## 2019-04-14 DIAGNOSIS — Z8572 Personal history of non-Hodgkin lymphomas: Secondary | ICD-10-CM | POA: Diagnosis not present

## 2019-04-14 DIAGNOSIS — R9431 Abnormal electrocardiogram [ECG] [EKG]: Secondary | ICD-10-CM

## 2019-04-14 DIAGNOSIS — Z91048 Other nonmedicinal substance allergy status: Secondary | ICD-10-CM | POA: Diagnosis not present

## 2019-04-14 DIAGNOSIS — F3340 Major depressive disorder, recurrent, in remission, unspecified: Secondary | ICD-10-CM | POA: Diagnosis not present

## 2019-04-14 DIAGNOSIS — J9691 Respiratory failure, unspecified with hypoxia: Secondary | ICD-10-CM | POA: Diagnosis present

## 2019-04-14 DIAGNOSIS — C8588 Other specified types of non-Hodgkin lymphoma, lymph nodes of multiple sites: Secondary | ICD-10-CM

## 2019-04-14 DIAGNOSIS — E785 Hyperlipidemia, unspecified: Secondary | ICD-10-CM | POA: Diagnosis present

## 2019-04-14 DIAGNOSIS — K5903 Drug induced constipation: Secondary | ICD-10-CM

## 2019-04-14 DIAGNOSIS — R739 Hyperglycemia, unspecified: Secondary | ICD-10-CM | POA: Diagnosis present

## 2019-04-14 DIAGNOSIS — Z96642 Presence of left artificial hip joint: Secondary | ICD-10-CM | POA: Diagnosis present

## 2019-04-14 DIAGNOSIS — R0989 Other specified symptoms and signs involving the circulatory and respiratory systems: Secondary | ICD-10-CM

## 2019-04-14 DIAGNOSIS — J449 Chronic obstructive pulmonary disease, unspecified: Secondary | ICD-10-CM

## 2019-04-14 DIAGNOSIS — S72002D Fracture of unspecified part of neck of left femur, subsequent encounter for closed fracture with routine healing: Secondary | ICD-10-CM | POA: Diagnosis not present

## 2019-04-14 DIAGNOSIS — Z9981 Dependence on supplemental oxygen: Secondary | ICD-10-CM | POA: Diagnosis not present

## 2019-04-14 DIAGNOSIS — F329 Major depressive disorder, single episode, unspecified: Secondary | ICD-10-CM | POA: Diagnosis not present

## 2019-04-14 DIAGNOSIS — A499 Bacterial infection, unspecified: Secondary | ICD-10-CM | POA: Diagnosis not present

## 2019-04-14 DIAGNOSIS — E876 Hypokalemia: Secondary | ICD-10-CM

## 2019-04-14 DIAGNOSIS — N390 Urinary tract infection, site not specified: Secondary | ICD-10-CM | POA: Diagnosis not present

## 2019-04-14 DIAGNOSIS — S72002A Fracture of unspecified part of neck of left femur, initial encounter for closed fracture: Principal | ICD-10-CM

## 2019-04-14 DIAGNOSIS — G8918 Other acute postprocedural pain: Secondary | ICD-10-CM

## 2019-04-14 LAB — TYPE AND SCREEN
ABO/RH(D): O POS
Antibody Screen: NEGATIVE
Unit division: 0
Unit division: 0

## 2019-04-14 LAB — BPAM RBC
Blood Product Expiration Date: 202007282359
Blood Product Expiration Date: 202008052359
ISSUE DATE / TIME: 202007031135
ISSUE DATE / TIME: 202007031502
Unit Type and Rh: 5100
Unit Type and Rh: 5100

## 2019-04-14 MED ORDER — PANTOPRAZOLE SODIUM 40 MG PO TBEC: 40.0000 mg | DELAYED_RELEASE_TABLET | Freq: Every day | ORAL | Status: DC

## 2019-04-14 MED ORDER — ENSURE ENLIVE PO LIQD
237.0000 mL | Freq: Two times a day (BID) | ORAL | Status: DC
  Administered 2019-04-15 – 2019-04-16 (×4): 237 mL via ORAL

## 2019-04-14 MED ORDER — POLYETHYLENE GLYCOL 3350 17 G PO PACK
17.0000 g | PACK | Freq: Every day | ORAL | Status: DC
  Administered 2019-04-15 – 2019-04-16 (×2): 17 g via ORAL
  Filled 2019-04-14 (×4): qty 1

## 2019-04-14 MED ORDER — IPRATROPIUM-ALBUTEROL 0.5-2.5 (3) MG/3ML IN SOLN
3.0000 mL | Freq: Four times a day (QID) | RESPIRATORY_TRACT | Status: DC | PRN
  Administered 2019-04-17: 06:00:00 3 mL via RESPIRATORY_TRACT
  Filled 2019-04-14: qty 3

## 2019-04-14 MED ORDER — ASPIRIN 81 MG PO CHEW: 81.0000 mg | CHEWABLE_TABLET | Freq: Two times a day (BID) | ORAL | 0 refills | Status: AC

## 2019-04-14 MED ORDER — POLYETHYLENE GLYCOL 3350 17 G PO PACK: 17.0000 g | PACK | Freq: Every day | ORAL | 0 refills | Status: DC

## 2019-04-14 MED ORDER — ACETAMINOPHEN 325 MG PO TABS
325.0000 mg | ORAL_TABLET | Freq: Four times a day (QID) | ORAL | Status: DC | PRN
  Administered 2019-04-14 – 2019-04-25 (×14): 650 mg via ORAL
  Filled 2019-04-14 (×16): qty 2

## 2019-04-14 MED ORDER — VITAMIN D 25 MCG (1000 UNIT) PO TABS
2000.0000 [IU] | ORAL_TABLET | Freq: Every day | ORAL | Status: DC
  Administered 2019-04-15 – 2019-04-25 (×11): 2000 [IU] via ORAL
  Filled 2019-04-14 (×11): qty 2

## 2019-04-14 MED ORDER — ONDANSETRON HCL 4 MG PO TABS: 4.0000 mg | ORAL_TABLET | Freq: Four times a day (QID) | ORAL | Status: DC | PRN

## 2019-04-14 MED ORDER — ONDANSETRON HCL 4 MG/2ML IJ SOLN: 4.0000 mg | Freq: Four times a day (QID) | INTRAMUSCULAR | Status: DC | PRN

## 2019-04-14 MED ORDER — ENOXAPARIN SODIUM 40 MG/0.4ML ~~LOC~~ SOLN
40.0000 mg | SUBCUTANEOUS | Status: DC
  Administered 2019-04-15 – 2019-04-24 (×10): 40 mg via SUBCUTANEOUS
  Filled 2019-04-14 (×11): qty 0.4

## 2019-04-14 MED ORDER — POLYETHYLENE GLYCOL 3350 17 G PO PACK
17.0000 g | PACK | Freq: Every day | ORAL | Status: DC
  Administered 2019-04-14: 17 g via ORAL
  Filled 2019-04-14: qty 1

## 2019-04-14 MED ORDER — CITALOPRAM HYDROBROMIDE 40 MG PO TABS
40.0000 mg | ORAL_TABLET | Freq: Every day | ORAL | Status: DC
  Administered 2019-04-14: 40 mg via ORAL
  Filled 2019-04-14: qty 1

## 2019-04-14 MED ORDER — DOCUSATE SODIUM 100 MG PO CAPS
100.0000 mg | ORAL_CAPSULE | Freq: Two times a day (BID) | ORAL | Status: DC
  Administered 2019-04-14 – 2019-04-16 (×3): 100 mg via ORAL
  Filled 2019-04-14 (×4): qty 1

## 2019-04-14 MED ORDER — SENNA 8.6 MG PO TABS
1.0000 | ORAL_TABLET | Freq: Two times a day (BID) | ORAL | Status: DC
  Administered 2019-04-14 – 2019-04-16 (×3): 8.6 mg via ORAL
  Filled 2019-04-14 (×4): qty 1

## 2019-04-14 MED ORDER — VITAMIN D 25 MCG (1000 UNIT) PO TABS
2000.0000 [IU] | ORAL_TABLET | Freq: Every day | ORAL | Status: DC
  Administered 2019-04-14: 2000 [IU] via ORAL
  Filled 2019-04-14 (×2): qty 2

## 2019-04-14 MED ORDER — MENTHOL 3 MG MT LOZG
1.0000 | LOZENGE | OROMUCOSAL | Status: DC | PRN
  Filled 2019-04-14: qty 9

## 2019-04-14 MED ORDER — PHENOL 1.4 % MT LIQD: 1.0000 | OROMUCOSAL | Status: DC | PRN

## 2019-04-14 MED ORDER — FERROUS SULFATE 325 (65 FE) MG PO TABS
325.0000 mg | ORAL_TABLET | Freq: Two times a day (BID) | ORAL | Status: DC
  Administered 2019-04-14 – 2019-04-16 (×4): 325 mg via ORAL
  Filled 2019-04-14 (×4): qty 1

## 2019-04-14 MED ORDER — CITALOPRAM HYDROBROMIDE 20 MG PO TABS
40.0000 mg | ORAL_TABLET | Freq: Every day | ORAL | Status: DC
  Administered 2019-04-15: 40 mg via ORAL
  Filled 2019-04-14: qty 2

## 2019-04-14 NOTE — Progress Notes (Signed)
Jamse Arn, MD  Physician  Physical Medicine and Rehabilitation  PMR Pre-admission  Signed  Date of Service:  04/12/2019 4:59 PM      Related encounter: ED to Hosp-Admission (Discharged) from 04/09/2019 in [facility]         Show:Clear all [x] Manual[x] Template[] Copied  Added by: [x] Cristina Gong, RN[x] Jamse Arn, MD  [] Hover for details PMR Admission Coordinator Pre-Admission Assessment  Patient: Brooke Washington is an 78 y.o., female MRN: [id number] DOB: 09/22/1941 Height: 5' 4"  (162.6 cm) Weight: 52.2 kg  Insurance Information HMO:     PPO:      PCP:      IPA:      80/20:      OTHER: no HMO PRIMARY: Medicare a and b      Policy#: [id number]      Subscriber: pt Benefits:  Phone #: passport one online     Name: 04/12/2019 Eff. Date: 10/11/2005     Deduct: $1408      Out of Pocket Max: none      Life Max: none CIR: 100%      SNF: 20 full days Outpatient: 80%     Co-Pay: 20% Home Health: 100%      Co-Pay: none DME: 805     Co-Pay: 20% Providers: pt choice  SECONDARY: [employer] of [place] supplement      Policy#: [id number]      Subscriber: pt  Medicaid Application Date:       Case Manager:  Disability Application Date:       Case Worker:   The "Data Collection Information Summary" for patients in Inpatient Rehabilitation Facilities with attached "Privacy Act [place] Records" was provided and verbally reviewed with: Patient and Family  Emergency Contact Information         Contact Information    Name Relation Home Work Mobile   [place] Spouse [phone]        Current Medical History  Patient Admitting Diagnosis: left femoral neck fracture  History of Present Illness: 78 year old female with past medical history of lymphoma, humerus fracture 2019 and COPD. Presented to ED on 03/31/2019 after a fall at home. Loss of balance while she was attempting to pick something up. XRAY showed  left transcervical proximal femur fracture.   Ortho consulted for displaced left femoral neck fracture. Dr. Lyla Glassing to OR for left total hip arthroplasty, anterior approach.Postoperatively pt somewhat hypoxic and maintaining on 4 liters oxygen. Pt not on oxygen at home. Hgb drop from 13 to 7.0 on 7/2. Baseline Hgb 13.0 on 6/20. Transfused 2 units PRBCs on 7/3. No BM, no worsening edema at surgical site.   DVT prophylaxis Lovenox in house. Plan to d/c on ASA 81 mg po BID for 6 weeks, SCDs and TEDS.    Patient's oncologist, Dr. Alvy Bimler aware of admit. Not to resume Revlimid until return in follow up.  Patient's medical record from [facility]  has been reviewed by the rehabilitation admission coordinator and physician.  Past Medical History      Past Medical History:  Diagnosis Date  . Cancer ([place])  APRIL OF 2000   LOW-GRADE NON-HODGKIN'S LYMPHOMA  . COPD (chronic obstructive pulmonary disease) ([place])   . Depression   . Emphysema of lung ([place])   . GERD (gastroesophageal reflux disease)   . Hyperlipidemia     Family History   family history includes Cancer in her mother.  Prior Rehab/Hospitalizations Has the patient had prior rehab or hospitalizations prior to admission? Yes  Has the patient had major surgery during 100 days prior to admission? Yes             Current Medications  Current Facility-Administered Medications:  .  acetaminophen (TYLENOL) tablet 325-650 mg, 325-650 mg, Oral, Q6H PRN, Swinteck, Aaron Edelman, MD .  docusate sodium (COLACE) capsule 100 mg, 100 mg, Oral, BID, Swinteck, Aaron Edelman, MD, 100 mg at 04/13/19 1047 .  enoxaparin (LOVENOX) injection 40 mg, 40 mg, Subcutaneous, Q24H, Swinteck, Aaron Edelman, MD, 40 mg at 04/13/19 0856 .  feeding supplement (ENSURE ENLIVE) (ENSURE ENLIVE) liquid 237 mL, 237 mL, Oral, BID BM, Swinteck, Brian, MD, 237 mL at 04/13/19 1048 .  ferrous sulfate tablet 325 mg, 325 mg, Oral, BID WC, Hall, Carole N, DO, 325 mg at 04/13/19  0855 .  HYDROcodone-acetaminophen (NORCO) 7.5-325 MG per tablet 1-2 tablet, 1-2 tablet, Oral, Q4H PRN, Swinteck, Aaron Edelman, MD .  HYDROcodone-acetaminophen (NORCO/VICODIN) 5-325 MG per tablet 1-2 tablet, 1-2 tablet, Oral, Q4H PRN, Rod Can, MD, 1 tablet at 04/12/19 [phone] .  ipratropium-albuterol (DUONEB) 0.5-2.5 (3) MG/3ML nebulizer solution 3 mL, 3 mL, Nebulization, Q6H PRN, Rod Can, MD, 3 mL at 04/10/19 2050 .  menthol-cetylpyridinium (CEPACOL) lozenge 3 mg, 1 lozenge, Oral, PRN **OR** phenol (CHLORASEPTIC) mouth spray 1 spray, 1 spray, Mouth/Throat, PRN, Swinteck, Aaron Edelman, MD .  metoCLOPramide (REGLAN) tablet 5-10 mg, 5-10 mg, Oral, Q8H PRN **OR** metoCLOPramide (REGLAN) injection 5-10 mg, 5-10 mg, Intravenous, Q8H PRN, Swinteck, Aaron Edelman, MD .  morphine 2 MG/ML injection 0.5-1 mg, 0.5-1 mg, Intravenous, Q2H PRN, Swinteck, Aaron Edelman, MD .  ondansetron (ZOFRAN) tablet 4 mg, 4 mg, Oral, Q6H PRN **OR** ondansetron (ZOFRAN) injection 4 mg, 4 mg, Intravenous, Q6H PRN, Swinteck, Aaron Edelman, MD .  senna (SENOKOT) tablet 8.6 mg, 1 tablet, Oral, BID, Swinteck, Aaron Edelman, MD, 8.6 mg at 04/13/19 1047 .  sodium chloride flush (NS) 0.9 % injection 10-40 mL, 10-40 mL, Intracatheter, PRN, Irene Pap N, DO  Patients Current Diet:     Diet Order                  Diet Carb Modified Fluid consistency: Thin; Room service appropriate? Yes  Diet effective now               Precautions / Restrictions Precautions Precautions: Fall Precaution Comments: watch O2 Restrictions Weight Bearing Restrictions: Yes LLE Weight Bearing: Weight bearing as tolerated   Has the patient had 2 or more falls or a fall with injury in the past year? Yes  Prior Activity Level    Prior Functional Level Self Care: Did the patient need help bathing, dressing, using the toilet or eating? Independent  Indoor Mobility: Did the patient need assistance with walking from room to room (with or without device)? Independent   Stairs: Did the patient need assistance with internal or external stairs (with or without device)? Independent  Functional Cognition: Did the patient need help planning regular tasks such as shopping or remembering to take medications? Independent  Home Assistive Devices / Equipment Home Assistive Devices/Equipment: CBG Meter Home Equipment: Cane - single point, Toilet riser  Prior Device Use: Indicate devices/aids used by the patient prior to current illness, exacerbation or injury? None of the above  Current Functional Level Cognition  Overall Cognitive Status: Within Functional Limits for tasks assessed Orientation Level: Oriented X4 General Comments: WFL for basic mobility tasks but did note slower processing    Extremity Assessment (includes Sensation/Coordination)  Upper Extremity Assessment: Generalized weakness  Lower Extremity Assessment: Defer to PT evaluation LLE Deficits / Details: s/p THA, grossly 2/5, limited by pain    ADLs  Overall ADL's : Needs assistance/impaired Toilet Transfer: Squat-pivot, Maximal assistance, Moderate assistance, +2 for physical assistance Toilet Transfer Details (indicate cue type and reason): max difficulty initiating pivotal steps. ultimately max +2 assist to advance B hips to [facility], going to R side.  General ADL Comments: Pt received in bed with NT needing to void. Pt struggled with pivotal transfer to [facility]. +2 mod A for percare. Pt then ambulated in the room, about the distance to the bathroom, with some encouragment.     Mobility  Overal bed mobility: Needs Assistance Bed Mobility: Supine to Sit Supine to sit: Max assist, HOB elevated General bed mobility comments: Pt reaching for therapist to assist with pulling trunk up to get to EOB. Max A with trunk, bottom and LEs to get to EOB. + dizziness.    Transfers  Overall transfer level: Needs assistance Equipment used: Rolling walker (2 wheeled) Transfers: Sit to/from Stand  Sit to Stand: Mod assist, +2 safety/equipment Squat pivot transfers: Max assist, +2 physical assistance General transfer comment: Assist of 2 to power to standing from EOB x2, from [facility] x1. Cues for hand placement/technique. Cues for upright posture. Favors placing weight through RLE.    Ambulation / Gait / Stairs / Wheelchair Mobility  Ambulation/Gait Ambulation/Gait assistance: Mod assist, +2 safety/equipment Gait Distance (Feet): 12 Feet Assistive device: Rolling walker (2 wheeled) Gait Pattern/deviations: Trunk flexed, Step-to pattern, Decreased step length - right, Decreased step length - left, Decreased stride length, Decreased weight shift to left General Gait Details: Deferred secondary to low Hemoglobin and pt politely decling.    Posture / Balance Balance Overall balance assessment: Needs assistance Sitting-balance support: Feet supported, Bilateral upper extremity supported Sitting balance-Leahy Scale: Fair Standing balance support: During functional activity Standing balance-Leahy Scale: Poor Standing balance comment: reliant on external support and BUEs on RW.    Special needs/care consideration BiPAP/CPAP  N/a CPM  N/a Continuous Drip IV  Right chest porto cath Dialysis  N/a Life Vest  N/a Oxygen  None at baseline PTA; O2 at * liters [place] Special Bed  N/a Trach Size  N/a Wound Vac n/a Skin surgical incision with dressing Bowel mgmt:  LBM  6/29 Bladder mgmt:  External catheter Diabetic mgmt:  N/a Behavioral consideration  N/a Chemo/radiation  Yes history of chemo currently in remission   Previous Home Environment  Living Arrangements: Spouse/significant other, Other relatives  Lives With: Spouse(56 year old grand daughter live with her grandparents) Available Help at Discharge: Family, Available 24 hours/day Type of Home: House Home Layout: Two level, Able to live on main level with bedroom/bathroom, Full bath on main level Home Access: Level entry Entrance  Stairs-Number of Steps: 1 Bathroom Shower/Tub: Walk-in shower, Tub/shower unit(have both available on first level) Biochemist, clinical: Handicapped height Bathroom Accessibility: Yes How Accessible: Accessible via walker Home Care Services: No Additional Comments: Pt's daughter works for [employer]  Discharge Living Setting Plans for Discharge Living Setting: Patient's home, Lives with (comment)(sposue and 63 year old grand daughter) Type of Home at Discharge: House Discharge Home Layout: Two level, Full bath on main level, Able to live on main level with bedroom/bathroom Discharge Home Access: Level entry Discharge Bathroom Shower/Tub: Tub/shower unit, Walk-in shower Discharge Bathroom Toilet: Handicapped height Discharge Bathroom Accessibility: Yes How Accessible: Accessible via walker Does the patient have any problems obtaining  your medications?: No  Social/Family/Support Systems Patient Roles: Spouse, Parent Contact Information: spouse, Herbie [place] Anticipated Caregiver: spouse and grand daughter Anticipated Caregiver's Contact Information: [phone] Ability/Limitations of Caregiver: no limitations Caregiver Availability: 24/7 Discharge Plan Discussed with Primary Caregiver: Yes Is Caregiver In Agreement with Plan?: Yes Does Caregiver/Family have Issues with Lodging/Transportation while Pt is in Rehab?: No  Goals/Additional Needs Patient/Family Goal for Rehab: superivsion to min asisst with PT and OT Expected length of stay: ELOS 10 to 14 days Pt/Family Agrees to Admission and willing to participate: Yes Program Orientation Provided & Reviewed with Pt/Caregiver Including Roles  & Responsibilities: Yes  Decrease burden of Care through IP rehab admission: n/a  Possible need for SNF placement upon discharge: not anticipated  Patient Condition: I have reviewed medical records from [facility]  and patient and spouse. I met with patient at the bedside for  inpatient rehabilitation assessment.  Patient will benefit from ongoing PT and OT, can actively participate in 3 hours of therapy a day 5 days of the week, and can make measurable gains during the admission.  Patient will also benefit from the coordinated team approach during an Inpatient Acute Rehabilitation admission.  The patient will receive intensive therapy as well as Rehabilitation physician, nursing, social worker, and care management interventions.  Due to bladder management, bowel management, safety, skin/wound care, disease management, medication administration, pain management and patient education the patient requires 24 hour a day rehabilitation nursing.  The patient is currently mod assist with mobility and basic ADLs.  Discharge setting and therapy post discharge at home with home health is anticipated.  Patient has agreed to participate in the Acute Inpatient Rehabilitation Program and will admit 04/14/2019.  Preadmission Screen Completed By:  Cleatrice Burke RN MSN 04/13/2019 3:14 PM ______________________________________________________________________   Discussed status with Dr. Posey Pronto  on 04/13/2019 at 1200 and received approval for admission Saturday 04/14/2019  Admission Coordinator:  Cleatrice Burke, RN MSN, time 1200 Date  04/13/2019   Assessment/Plan: Diagnosis: Left hip fracture  1. Does the need for close, 24 hr/day Medical supervision in concert with the patient's rehab needs make it unreasonable for this patient to be served in a less intensive setting? Yes  2. Co-Morbidities requiring supervision/potential complications: lymphoma, humerus fracture 2019 and COPD 3. Due to bladder management, safety, skin/wound care, disease management, pain management and patient education, does the patient require 24 hr/day rehab nursing? Yes 4. Does the patient require coordinated care of a physician, rehab nurse, PT (1-2 hrs/day, 5 days/week) and OT (1-2 hrs/day, 5 days/week)  to address physical and functional deficits in the context of the above medical diagnosis(es)? Yes Addressing deficits in the following areas: balance, endurance, locomotion, strength, transferring, bathing, dressing, toileting and psychosocial support 5. Can the patient actively participate in an intensive therapy program of at least 3 hrs of therapy 5 days a week? Yes 6. The potential for patient to make measurable gains while on inpatient rehab is excellent 7. Anticipated functional outcomes upon discharge from inpatients are: min assist PT, min assist OT, n/a SLP 8. Estimated rehab length of stay to reach the above functional goals is: 14-17 days. 9. Anticipated D/C setting: Home 10. Anticipated post D/C treatments: HH therapy and Home excercise program 11. Overall Rehab/Functional Prognosis: good  MD Signature: Delice Lesch, MD, ABPMR 04/14/2019        Revision History

## 2019-04-14 NOTE — Progress Notes (Signed)
Subjective: 4 Days Post-Op Procedure(s) (LRB): TOTAL HIP ARTHROPLASTY ANTERIOR APPROACH LEFT HIP (Left) Patient reports pain as moderate with WB but not pain at rest.  SHe has been OOB with PT.  Tolerating regular diet.  Objective: Vital signs in last 24 hours: Temp:  [98 F (36.7 C)-98.7 F (37.1 C)] 98 F (36.7 C) (07/04 0429) Pulse Rate:  [60-98] 60 (07/04 0429) Resp:  [12-22] 20 (07/04 0429) BP: (91-128)/(52-77) 128/52 (07/04 0429) SpO2:  [95 %-99 %] 98 % (07/03 1600)  Intake/Output from previous day: 07/03 0701 - 07/04 0700 In: 722.2 [P.O.:80; I.V.:0.2; Blood:642] Out: 1125 [Urine:1125] Intake/Output this shift: No intake/output data recorded.  Recent Labs    04/12/19 0550 04/13/19 0557 04/13/19 2055  HGB 7.4* 7.0* 10.7*   Recent Labs    04/12/19 0550 04/12/19 1833 04/13/19 0557 04/13/19 2055  WBC 5.3  --  4.8  --   RBC 2.19* 2.17* 2.00*  --   HCT 22.7* 20.2* 21.1* 31.0*  PLT 123*  --  127*  --    Recent Labs    04/12/19 0550  NA 140  K 3.6  CL 104  CO2 29  BUN 17  CREATININE 0.89  GLUCOSE 95  CALCIUM 8.7*   No results for input(s): LABPT, INR in the last 72 hours.  WN WD woman in nad.  A and O x 4.  Mood and affect normal.  EOMI.  resp unlabored.  L hip incision dressed and dry.  NVI at L LE.   Assessment/Plan: 4 Days Post-Op Procedure(s) (LRB): TOTAL HIP ARTHROPLASTY ANTERIOR APPROACH LEFT HIP (Left) Rehab admission pending.  WBAT on L LE.  Continue PT.  F/u in the office in two weeks for suture removal.  Lovenox for DVT prophylaxis.      Brooke Washington 04/14/2019, 8:12 AM

## 2019-04-14 NOTE — Plan of Care (Signed)
  Problem: Consults Goal: RH GENERAL PATIENT EDUCATION Description: See Patient Education module for education specifics. Outcome: Progressing Goal: Nutrition Consult-if indicated Outcome: Progressing   Problem: RH BOWEL ELIMINATION Goal: RH STG MANAGE BOWEL WITH ASSISTANCE Description: STG Manage Bowel with min Assistance. Outcome: Progressing Goal: RH STG MANAGE BOWEL W/MEDICATION W/ASSISTANCE Description: STG Manage Bowel with Medication with min Assistance. Outcome: Progressing   Problem: RH BLADDER ELIMINATION Goal: RH STG MANAGE BLADDER WITH ASSISTANCE Description: STG Manage Bladder With min Assistance Outcome: Progressing   Problem: RH SKIN INTEGRITY Goal: RH STG SKIN FREE OF INFECTION/BREAKDOWN Outcome: Progressing Goal: RH STG ABLE TO PERFORM INCISION/WOUND CARE W/ASSISTANCE Description: STG Able To Perform Incision/Wound Care With min Assistance. Outcome: Progressing   Problem: RH SAFETY Goal: RH STG ADHERE TO SAFETY PRECAUTIONS W/ASSISTANCE/DEVICE Description: STG Adhere to Safety Precautions With mod I Assistance/Device. Outcome: Progressing   Problem: RH PAIN MANAGEMENT Goal: RH STG PAIN MANAGED AT OR BELOW PT'S PAIN GOAL Description: Less than 3 out of 10 Outcome: Progressing

## 2019-04-14 NOTE — H&P (Signed)
Physical Medicine and Rehabilitation Admission H&P    Chief Complaint  Patient presents with    Functional decline due to left hip fracture    HPI: Brooke Washington is a 78 year old female with history of COPD and lymphoma who was admitted on 04/10/2019 after losing her balance and sustaining a fall onto left hip with onset of pain.  History taken from chart review and patient.  X-rays done in ED revealing left transcervical proximal femur fracture and patient underwent left total hip arthroplasty with anterior approach by Dr. Lyla Glassing on 03/14/2019.  Postop to be weightbearing as tolerated and on aspirin for DVT prophylaxis.  She has had issues with hypoxia requiring supplemental oxygen as well as issues with hypotension.  Hospital course further complicated by acute blood loss anemia and hematology/oncology recommended holding holding Revlimid.  She received a blood transfusion on 04/13/2019.  Therapy ongoing and patient is noted to have deficits in mobility and ADLs.  She is fatigued from sitting in the chair and asks to return to the bed.  CIR recommended due to functional decline. Please see preadmission assessment from today as well.     Review of Systems  Constitutional: Negative for chills and fever.  HENT: Negative for hearing loss and tinnitus.   Eyes: Negative for double vision.       Vision has gone down due to chemo  Respiratory: Negative for cough and shortness of breath.   Cardiovascular: Negative for chest pain, palpitations and leg swelling.  Gastrointestinal: Positive for constipation. Negative for heartburn and nausea.  Genitourinary: Negative for dysuria and urgency.  Musculoskeletal: Positive for joint pain (left hip pain) and myalgias (left thigh pain).  Skin: Negative for rash.  Neurological: Positive for focal weakness. Negative for dizziness, sensory change, speech change and headaches.  Psychiatric/Behavioral: The patient is not nervous/anxious and does not have  insomnia.   All other systems reviewed and are negative.    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   IR FLUORO GUIDE PORT INSERTION RIGHT  12/07/2017   IR US GUIDE VASC ACCESS RIGHT  12/07/2017   ORIF HUMERUS FRACTURE Right 11/23/2017   Procedure: OPEN REDUCTION INTERNAL FIXATION (ORIF) HUMERAL SHAFT FRACTURE;  Surgeon: Shona Needles, MD;  Location: [facility];  Service: Orthopedics;  Laterality: Right;   RIGHT OOPHORECTOMY  1976   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   Procedure: TOTAL HIP ARTHROPLASTY ANTERIOR APPROACH LEFT HIP;  Surgeon: Rod Can, MD;  Location: [facility];  Service: Orthopedics;  Laterality: Left;    Family History  Problem Relation Age of Onset   Cancer Mother        colon ca    Social History: Married.  Independent prior to admission.  Reports that she quit smoking about 4 years ago. Her smoking use included cigarettes. She has a  25 pack-year smoking history. She has never used smokeless tobacco. She reports that she drinks alcohol once a month. She reports that she does not use drugs.   Allergies  Allergen Reactions   Contrast Media [Iodinated Diagnostic  Agents] Shortness Of Breath, Rash and Other (See Comments)    Throat swells   Iohexol Shortness Of Breath   Povidone-Iodine Hives, Shortness Of Breath and Rash    REACTION: throat swells   Penicillins Hives, Itching and Rash    Has patient had a PCN reaction causing immediate rash, facial/tongue/throat swelling, SOB or lightheadedness with hypotension: Unknown Has patient had a PCN reaction causing severe  rash involving mucus membranes or skin necrosis: Yes Has patient had a PCN reaction that required hospitalization: Unknown Has patient had a PCN reaction occurring within the last 10 years: Unknown If all of the above answers are "NO", then may proceed with Cephalosporin use.    Adhesive [Tape] Hives    Medications Prior to Admission  Medication Sig Dispense Refill   albuterol (PROVENTIL HFA;VENTOLIN HFA) 108 (90 Base) MCG/ACT inhaler Inhale 2 puffs into the lungs every 6 (six) hours as needed for wheezing or shortness of breath. 1 Inhaler 0   aspirin 81 MG tablet Take 81 mg by mouth daily.     BREO ELLIPTA 100-25 MCG/INH AEPB ONE INHALTION INTO LUNGS EVERY DAY (Patient taking differently: Inhale 1 puff into the lungs every other day. ) 60 each 6   cholecalciferol (VITAMIN D) 1000 units tablet Take 2,000 Units by mouth daily.     citalopram (CELEXA) 40 MG tablet TAKE 1 TABLET BY MOUTH EVERY DAY (Patient taking differently: Take 40 mg by mouth daily. ) 90 tablet 1   lenalidomide (REVLIMID) 10 MG capsule Take 1 capsule daily for 7 days and then take 7 days off. Repeat every 2 weeks for cycle of every 28 days 14 capsule 11   acetaminophen (TYLENOL) 325 MG tablet Take 2 tablets (650 mg total) by mouth every 6 (six) hours as needed for mild pain (or Fever >/= 101).     magic mouthwash w/lidocaine SOLN Take 5 mLs by mouth 4 (four) times daily. (Patient not taking: Reported on 04/10/2019) 240 mL 0   pantoprazole (PROTONIX) 40 MG tablet Take 1 tablet (40 mg total) by mouth daily. (Patient not taking: Reported on 04/10/2019) 180 tablet 3    Drug Regimen Review  Drug regimen was reviewed and remains appropriate with no significant issues identified  Home: Home Living Family/patient expects to be discharged to:: Private residence Living Arrangements: Spouse/significant other, Other relatives Available Help at Discharge: Family, Available 24 hours/day Type of Home: House Home Access: Level  entry Entrance Stairs-Number of Steps: 1 Home Layout: Two level, Able to live on main level with bedroom/bathroom, Full bath on main level Bathroom Shower/Tub: Walk-in shower, Tub/shower unit(have both available on first level) Biochemist, clinical: Handicapped height Bathroom Accessibility: Yes Home Equipment: [place] - single point, Toilet riser Additional Comments: Pt's daughter works for [employer]  Lives With: Spouse(44 year old grand daughter live with her grandparents)   Functional History: Prior Function Level of Independence: Independent Comments: Reports used to work out regularly with husband at [facility] [employer] prior to [employer]  Functional Status:  Mobility: Bed Mobility Overal bed mobility: Needs Assistance Bed Mobility: Supine to Sit Supine to sit: Mod assist, HOB elevated General bed mobility comments: Able to initiate bringing LLE to EOB but needed assist; assist with bottom and trunk. Transfers Overall transfer level: Needs assistance Equipment used: Rolling walker (2 wheeled) Transfers: Sit to/from Stand Sit to Stand: Mod assist, +2 physical assistance Squat pivot transfers: Max assist, +2 physical assistance General transfer comment: Assist of 2 to power to standing from EOB x1. Cues  for hand placement/technique. Cues for upright posture. Favors placing weight through RLE. Transferred to chair post ambulation. Ambulation/Gait Ambulation/Gait assistance: Min assist, +2 safety/equipment Gait Distance (Feet): 20 Feet Assistive device: Rolling walker (2 wheeled) Gait Pattern/deviations: Trunk flexed, Step-to pattern, Decreased step length - right, Decreased step length - left, Decreased stride length, Decreased weight shift to left, Step-through pattern General Gait Details: Slow, mildly unsteady gait with RW for support; cues for RW proximity/management. Assist with pushing RW forward and cues for upright posture. SP02 dropped to 86% on 2L/min 02. HR up to 124 bpm. Gait  velocity: decreased    ADL: ADL Overall ADL's : Needs assistance/impaired Toilet Transfer: Squat-pivot, Maximal assistance, Moderate assistance, +2 for physical assistance Toilet Transfer Details (indicate cue type and reason): max difficulty initiating pivotal steps. ultimately max +2 assist to advance B hips to [facility], going to R side.  General ADL Comments: Pt received in bed with NT needing to void. Pt struggled with pivotal transfer to [facility]. +2 mod A for percare. Pt then ambulated in the room, about the distance to the bathroom, with some encouragment.   Cognition: Cognition Overall Cognitive Status: Within Functional Limits for tasks assessed Orientation Level: Oriented X4 Cognition Arousal/Alertness: Awake/alert Behavior During Therapy: WFL for tasks assessed/performed Overall Cognitive Status: Within Functional Limits for tasks assessed Area of Impairment: Problem solving Problem Solving: Slow processing, Difficulty sequencing, Requires verbal cues, Requires tactile cues General Comments: WFL for basic mobility tasks but did note slower processing   Blood pressure (!) 123/52, pulse 76, temperature 98 F (36.7 C), temperature source Oral, resp. rate 16, height 5\' 4"  (1.626 m), weight 52.2 kg, SpO2 93 %. Physical Exam  Nursing note and vitals reviewed. Constitutional: She is oriented to person, place, and time. She appears well-developed and well-nourished.  HENT:  Head: Atraumatic.  Eyes: EOM are normal. Right eye exhibits no discharge. Left eye exhibits no discharge.  Respiratory: Effort normal. No respiratory distress.  + [place]  GI: She exhibits no distension.  Musculoskeletal:     Comments: Left hip with edema and tenderness  Neurological: She is alert and oriented to person, place, and time.  Motor: Bilateral upper extremities: 5/5 proximal to distal Right lower extremity: Hip flexion 4--4/5, knee extension 4+/5, ankle dorsiflexion 5/5 Left lower extremity: Hip flexion  4-/5, knee extension 4/5, and dorsiflexion 5/5 Sensation intact light touch  Skin:  Petechial lesions left thigh. Surgical dressing left hip C/D/I.   Psychiatric: She has a normal mood and affect. Her behavior is normal.    Results for orders placed or performed during the hospital encounter of 04/09/19 (from the past 48 hour(s))  Iron and TIBC     Status: Abnormal   Collection Time: 04/12/19  6:33 PM  Result Value Ref Range   Iron 14 (L) 28 - 170 ug/dL   TIBC 253 250 - 450 ug/dL   Saturation Ratios 6 (L) 10.4 - 31.8 %   UIBC 239 ug/dL    Comment: Performed at [facility] [address], [place], [place] [address]  Ferritin     Status: None   Collection Time: 04/12/19  6:33 PM  Result Value Ref Range   Ferritin 135 11 - 307 ng/mL    Comment: Performed at [facility], [place] [address], [place], [place] [address]  Reticulocytes     Status: Abnormal   Collection Time: 04/12/19  6:33 PM  Result Value Ref Range   Retic Ct Pct 2.0 0.4 - 3.1 %  RBC. 2.17 (L) 3.87 - 5.11 MIL/uL   Retic Count, Absolute 42.7 19.0 - 186.0 K/uL   Immature Retic Fract 8.1 2.3 - 15.9 %    Comment: Performed at [facility] [address], [place], [place] [address]  Folate RBC     Status: Abnormal   Collection Time: 04/12/19  6:33 PM  Result Value Ref Range   Folate, Hemolysate 295.0 Not Estab. ng/mL   Hematocrit 20.2 (L) 34.0 - 46.6 %   Folate, RBC 1,460 >498 ng/mL    Comment: (NOTE) Performed At: [facility] [place], [place] [id number] Rush Farmer MD [id number]   Vitamin B12     Status: Abnormal   Collection Time: 04/12/19  6:33 PM  Result Value Ref Range   Vitamin B-12 94 (L) 180 - 914 pg/mL    Comment: (NOTE) This assay is not validated for testing neonatal or myeloproliferative syndrome specimens for Vitamin B12 levels. Performed at [facility], [place] [address], [place], [place] [address]   Save Smear     Status: None   Collection  Time: 04/12/19  6:33 PM  Result Value Ref Range   Smear Review SMEAR STAINED AND AVAILABLE FOR REVIEW     Comment: Performed at [facility], [place] [address], [place], [place] [address]  Hematology Comments:     Status: None   Collection Time: 04/12/19  6:33 PM  Result Value Ref Range   Hematology Comments: Note:     Comment: (NOTE) Verified by microscopic examination. Performed At: [facility] [place], [place] [id number] Rush Farmer MD TS:[id number]   Glucose, capillary     Status: Abnormal   Collection Time: 04/12/19  8:56 PM  Result Value Ref Range   Glucose-Capillary 109 (H) 70 - 99 mg/dL  CBC     Status: Abnormal   Collection Time: 04/13/19  5:57 AM  Result Value Ref Range   WBC 4.8 4.0 - 10.5 K/uL   RBC 2.00 (L) 3.87 - 5.11 MIL/uL   Hemoglobin 7.0 (L) 12.0 - 15.0 g/dL   HCT 21.1 (L) 36.0 - 46.0 %   MCV 105.5 (H) 80.0 - 100.0 fL   MCH 35.0 (H) 26.0 - 34.0 pg   MCHC 33.2 30.0 - 36.0 g/dL   RDW 13.9 11.5 - 15.5 %   Platelets 127 (L) 150 - 400 K/uL   nRBC 0.0 0.0 - 0.2 %    Comment: Performed at [facility], [place] [address] [place], [place] [address]  Glucose, capillary     Status: None   Collection Time: 04/13/19  7:42 AM  Result Value Ref Range   Glucose-Capillary 97 70 - 99 mg/dL  Prepare RBC     Status: None   Collection Time: 04/13/19  7:53 AM  Result Value Ref Range   Order Confirmation      ORDER PROCESSED BY BLOOD BANK Performed at [facility], [place] [address], [place], [place] [address]   Glucose, capillary     Status: Abnormal   Collection Time: 04/13/19 12:52 PM  Result Value Ref Range   Glucose-Capillary 139 (H) 70 - 99 mg/dL  Hemoglobin and hematocrit, blood     Status: Abnormal   Collection Time: 04/13/19  8:55 PM  Result Value Ref Range   Hemoglobin 10.7 (L) 12.0 - 15.0 g/dL    Comment: POST TRANSFUSION SPECIMEN   HCT 31.0 (L) 36.0 - 46.0 %    Comment:  Performed at [facility], [place] [address], [place], [place] [address]   No results found.     Medical Problem List and Plan: 1.  Deficits with mobility, transfers, self-care secondary to left hip fracture status post left THA on 04/10/2019.  Admit to CIR 2.  Antithrombotics: -DVT/anticoagulation:  Pharmaceutical: Lovenox  -antiplatelet therapy: ASA for DVT prophylaxis after discharge 3. Pain Management: Hydrocodone as needed 4. Mood: LCSW to follow for evaluation and support  -antipsychotic agents: N/A 5. Neuropsych: This patient is capable of making decisions on her own behalf. 6. Skin/Wound Care: Monitor wound for healing.  Nutritional supplements added. 7. Fluids/Electrolytes/Nutrition: Monitor I/O.  BMP ordered for Monday. 8. Hypoxic respiratory failure: COPD per films--encourage IS.  Wean supplemental oxygen as tolerated. 9. ABLA: Status post transfusion on 04/13/2019.  Continue iron supplement.  CBC ordered for Monday. 10. Persistent Hypotension: Likely due to anemia--transfused with 2 units packed red blood cells on 7/3.  Monitor with increased mobility. 3. H/o lymphoma: To hold Revlimid for now.  Follow-up with heme-onc for resumption after discharge 12. Constipation: Continue bowel regimen.  Post Admission Physician Evaluation: 1. Preadmission assessment reviewed and changes made below. 2. Functional deficits secondary  to left hip fracture. 3. Patient is admitted to receive collaborative, interdisciplinary care between the physiatrist, rehab nursing staff, and therapy team. 4. Patient's level of medical complexity and substantial therapy needs in context of that medical necessity cannot be provided at a lesser intensity of care such as a SNF. 5. Patient has experienced substantial functional loss from his/her baseline which was documented above under the "Functional History" and "Functional Status" headings.  Judging by the patient's diagnosis, physical exam, and functional history, the patient has potential for  functional progress which will result in measurable gains while on inpatient rehab.  These gains will be of substantial and practical use upon discharge  in facilitating mobility and self-care at the household level. 6. Physiatrist will provide 24 hour management of medical needs as well as oversight of the therapy plan/treatment and provide guidance as appropriate regarding the interaction of the two. 7. 24 hour rehab nursing will assist with bowel management, safety, skin/wound care, disease management, pain management and patient education  and help integrate therapy concepts, techniques,education, etc. 8. PT will assess and treat for/with: Lower extremity strength, range of motion, stamina, balance, functional mobility, safety, adaptive techniques and equipment, wound care, coping skills, pain control, education. Goals are: Supervision. 9. OT will assess and treat for/with: ADL's, functional mobility, safety, upper extremity strength, adaptive techniques and equipment, wound mgt, ego support, and community reintegration.   Goals are: Supervision/Min A. Therapy may proceed with showering this patient. 10. Case Management and Social Worker will assess and treat for psychological issues and discharge planning. 11. Team conference will be held weekly to assess progress toward goals and to determine barriers to discharge. 12. Patient will receive at least 3 hours of therapy per day at least 5 days per week. 13. ELOS: 10-14 days.       14. Prognosis:  good  I have personally performed a face to face diagnostic evaluation, including, but not limited to relevant history and physical exam findings, of this patient and developed relevant assessment and plan.  Additionally, I have reviewed and concur with the physician assistant's documentation above.  Delice Lesch, MD, ABPMR Bary Leriche, PA-C 04/14/2019

## 2019-04-14 NOTE — Discharge Summary (Signed)
Discharge Summary  Brooke Washington:[id number] DOB: July 02, 1941  PCP: [name], NP  Admit date: 04/09/2019 Discharge date: 04/14/2019  Time spent: 35 minutes  Recommendations for Outpatient Follow-up:  1. Continue physical therapy at inpatient rehab.  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    Discharge Condition: Stable  Diet recommendation: Resume previous diet.  Vitals:   04/14/19 0030 04/14/19 0429  BP:  (!) 128/52  Pulse:  60  Resp:  20  Temp: 98.3 F (36.8 C) 98 F (36.7 C)  SpO2:      History of present illness:   Patient is a 78 year old female with history of lymphoma on Revlimid, COPD who presents from home after a mechanical fall. X-ray done in the emergency department showed left transcervical proximal femur fracture. She is usually ambulatory at home and ambulates without the help of walker or cane. She lives with her husband. Orthopedicsconsulted and sheunderwent left total hip arthroplasty.She became hypotensive and hypoxic after surgery. Currently hemodynamically stable. Physical therapy recommended her to go to CIR on discharge.  Spinal cord complicated by drop in hemoglobin down to 7.0.  Iron deficiency noted on iron studies.  Started on ferrous sulfate supplement.  Transfused 2 unit of PRBCs.  No obvious sign of bleeding.  Hemoglobin on 04/13/19 post 2 unit PRBC transfusion was 10.7.  Vital signs and labs reviewed and are stable.  04/14/19: Patient seen and examined at her bedside.  She is alert and oriented x4.  No acute events overnight.  She denies chest pain, dyspnea, dizziness, abdominal pain or nausea.  Her left hip post op pain is well controlled.  She has no new complaints.  On the day of  discharge, the patient was hemodynamically stable.  She will need to continue physical therapy at inpatient rehab.  Fall precautions.  Recommendations per orthopedic surgery: Dr. Stann Mainland.  WBAT with walker DVT ppx: Lovenox in house --> d/c on ASA 81 mg PO BID x 6 weeks, SCDs, TEDS PO pain control PT/OT Dispo: rehab   Hospital Course:  Principal Problem:   Closed left hip fracture, initial encounter [facility]) Active Problems:   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[place])   COPD GOLD II  Left hip fracture secondary to mechanical fall POD #4 post left total hip arthroplasty on 04/10/2019 by Dr. Lyla Glassing. Left total hip arthroplasty done on 04/10/2019 by Dr. Lyla Glassing Continue PT OT as recommended by orthopedic surgery On subcu Lovenox for DVT prophylaxis in house DC on aspirin 81 mg p.o. twice daily x6 weeks as recommended by orthopedic surgery Pain management as needed WBAT with walker  Acute blood loss anemia post surgery Baseline hemoglobin 13 Hemoglobin dropped to 7.0 2 unit PRBC transfused. Repeat hemoglobin post 2 unit PRBC transfusion was 10.7. Iron studies revealed significant iron deficiency Continue iron supplement 325 mg twice daily FOBT pending, no bowel movements, continue laxatives while on opioids. No obvious signs of bleeding.  Iron deficiency anemia Management as stated above Follow-up with oncology outpatient  Improving acute hypoxic respiratory failure in the setting of COPD, unclear etiology Independently reviewed chest x-ray done on admission showed no active pulmonary disease. Independently reviewed chest x-ray done on 04/12/2019 which showed no active pulmonary disease.  No lobular infiltrates.  Hyperinflated lungs.  Patient denies O2 supplementation at baseline but states she has been on oxygen supplementation after her right arm surgery 5 years ago then was weaned off oxygen supplementation. Continue to maintain O2 saturation greater than 92% Wean off  oxygen as tolerated Currently on 2 L of oxygen via nasal cannula and saturating 98% improved from yesterday when she was requiring 4 L to maintain sats greater than 90% Home O2 evaluation prior to discharge to CIR.  Ambulatory dysfunction post left hip repair PT OT assessed and recommended CIR Continue physical therapy at [place] as tolerated with walker  Resolved hypotension with no prior history of hypertension Not on antihypertensive medications at home Attention has resolved Vital signs reviewed and are stable with map of 72 on 04/14/2019  History of lymphoma On Revlimid, hold per oncology Follow-up with oncology outpatient     Code Status:Full  Consultants:Orthopedics, CIR  Procedures: Left hip arthroplasty   Discharge Exam: BP (!) 128/52 (BP Location: Right Arm)    Pulse 60    Temp 98 F (36.7 C) (Oral)    Resp 20    Ht 5\' 4"  (1.626 m)    Wt 52.2 kg    LMP  (LMP Unknown)    SpO2 98%    BMI 19.74 kg/m   General: 78 y.o. year-old female well developed well nourished in no acute distress.  Alert and oriented x3.  Cardiovascular: Regular rate and rhythm with no rubs or gallops.  No thyromegaly or JVD noted.    Respiratory: Clear to auscultation with no wheezes or rales. Good inspiratory effort.  Abdomen: Soft nontender nondistended with normal bowel sounds x4 quadrants.  Musculoskeletal: Trace lower extremity edema. 2/4 pulses in all 4 extremities.  Skin: Improving petechiae involving left thigh.  Mild edema in the left hip.  Surgical dressing appears clean.  Psychiatry: Mood is appropriate for condition and setting  Discharge Instructions You were cared for by a hospitalist during your hospital stay. If you have any questions about your discharge medications or the care you received while you were in the hospital after you are discharged, you can call the unit and asked to speak with the hospitalist on call if the hospitalist that took care of  you is not available. Once you are discharged, your primary care physician will handle any further medical issues. Please note that NO REFILLS for any discharge medications will be authorized once you are discharged, as it is imperative that you return to your primary care physician (or establish a relationship with a primary care physician if you do not have one) for your aftercare needs so that they can reassess your need for medications and monitor your lab values.   Allergies as of 04/14/2019      Reactions   Contrast Media [iodinated Diagnostic Agents] Shortness Of Breath, Rash, Other (See Comments)   Throat swells   Iohexol Shortness Of Breath   Povidone-iodine Hives, Shortness Of Breath, Rash   REACTION: throat swells   Penicillins Hives, Itching, Rash   Has patient had a PCN reaction causing immediate rash, facial/tongue/throat swelling, SOB or lightheadedness with hypotension: Unknown Has patient had a PCN reaction causing severe rash involving mucus membranes or skin necrosis: Yes Has patient had a PCN reaction that required hospitalization: Unknown Has patient had a PCN reaction occurring within the last 10 years: Unknown If all of the above answers are "NO", then may proceed with Cephalosporin use.   Adhesive [tape] Hives      Medication  List    STOP taking these medications   acetaminophen 325 MG tablet Commonly known as: TYLENOL   aspirin 81 MG tablet Replaced by: aspirin 81 MG chewable tablet   magic mouthwash w/lidocaine Soln   pantoprazole 40 MG tablet Commonly known as: PROTONIX     TAKE these medications   albuterol 108 (90 Base) MCG/ACT inhaler Commonly known as: VENTOLIN HFA Inhale 2 puffs into the lungs every 6 (six) hours as needed for wheezing or shortness of breath.   aspirin 81 MG chewable tablet Commonly known as: Aspirin Childrens Chew 1 tablet (81 mg total) by mouth 2 (two) times daily with a meal. Replaces: aspirin 81 MG tablet   Breo Ellipta  100-25 MCG/INH Aepb Generic drug: fluticasone furoate-vilanterol ONE INHALTION INTO LUNGS EVERY DAY What changed: See the new instructions.   cholecalciferol 1000 units tablet Commonly known as: VITAMIN D Take 2,000 Units by mouth daily.   citalopram 40 MG tablet Commonly known as: CELEXA TAKE 1 TABLET BY MOUTH EVERY DAY   ferrous sulfate 325 (65 FE) MG tablet Take 1 tablet (325 mg total) by mouth 2 (two) times daily with a meal.   HYDROcodone-acetaminophen 5-325 MG tablet Commonly known as: NORCO/VICODIN Take 1-2 tablets by mouth every 4 (four) hours as needed for moderate pain (pain score 4-6).   lenalidomide 10 MG capsule Commonly known as: Revlimid Take 1 capsule daily for 7 days and then take 7 days off. Repeat every 2 weeks for cycle of every 28 days   polyethylene glycol 17 g packet Commonly known as: MIRALAX / GLYCOLAX Take 17 g by mouth daily.            Durable Medical Equipment  (From admission, onward)         Start     Ordered   04/12/19 1710  For home use only DME Walker rolling  Once    Question:  Patient needs a walker to treat with the following condition  Answer:  Ambulatory dysfunction   04/12/19 1710   04/12/19 1710  For home use only DME 3 n 1  Once     04/12/19 1710         Allergies  Allergen Reactions   Contrast Media [Iodinated Diagnostic Agents] Shortness Of Breath, Rash and Other (See Comments)    Throat swells   Iohexol Shortness Of Breath   Povidone-Iodine Hives, Shortness Of Breath and Rash    REACTION: throat swells   Penicillins Hives, Itching and Rash    Has patient had a PCN reaction causing immediate rash, facial/tongue/throat swelling, SOB or lightheadedness with hypotension: Unknown Has patient had a PCN reaction causing severe rash involving mucus membranes or skin necrosis: Yes Has patient had a PCN reaction that required hospitalization: Unknown Has patient had a PCN reaction occurring within the last 10 years:  Unknown If all of the above answers are "NO", then may proceed with Cephalosporin use.    Adhesive [Tape] Hives   Follow-up Information    Swinteck, Aaron Edelman, MD. Schedule an appointment as soon as possible for a visit in 2 weeks.   Specialty: Orthopedic Surgery Why: For wound re-check Contact information: [address] [place] [address] [phone]        [name], NP. Call in 1 day(s).   Specialty: Family Medicine Why: Please call for a post hospital follow up appointment. Contact information: [place] [address] [phone]        [name],  MD. Call in 1 day(s).   Specialty: Hematology and Oncology Why: Please call for a post hospital follow-up appointment. Contact information: [place] [address] [phone]            The results of significant diagnostics from this hospitalization (including imaging, microbiology, ancillary and laboratory) are listed below for reference.    Significant Diagnostic Studies: Ct Abdomen Pelvis Wo Contrast  Result Date: 04/09/2019 CLINICAL DATA:  Non-Hodgkin's lymphoma. EXAM: CT CHEST, ABDOMEN AND PELVIS WITHOUT CONTRAST TECHNIQUE: Multidetector CT imaging of the chest, abdomen and pelvis was performed following the standard protocol without IV contrast. COMPARISON:  Multiple PET-CT scans from 2019 FINDINGS: CT CHEST FINDINGS Cardiovascular: The heart is normal in size. No pericardial effusion. Stable moderate tortuosity, ectasia and calcification of the thoracic aorta. Stable coronary artery calcifications. Mediastinum/Nodes: No mediastinal or hilar mass or lymphadenopathy. The esophagus is grossly normal. There is a moderate to large hiatal hernia. Lungs/Pleura: Stable emphysematous changes and pulmonary scarring. No worrisome pulmonary lesions or pulmonary nodules. No pleural effusion or pleural lesions. Musculoskeletal: Degenerative changes and  mild scoliosis and osteoporosis but no worrisome bone lesions. Remote healed sternal fracture. No breast masses, supraclavicular or axillary adenopathy. Extensive carotid artery calcifications are noted. The thyroid gland is grossly normal. The right Port-A-Cath is stable. CT ABDOMEN PELVIS FINDINGS Hepatobiliary: No focal hepatic lesions are identified without contrast. The gallbladder appears normal. No common bile duct dilatation. Pancreas: No mass, inflammation or ductal dilatation. Spleen: Normal size.  No focal lesions. Adrenals/Urinary Tract: The adrenal glands and kidneys are unremarkable. No renal, ureteral or bladder calculi or obvious mass without contrast. Stomach/Bowel: The stomach, duodenum, small bowel and colon are unremarkable. No acute inflammatory changes, mass lesions or obstructive findings. Vascular/Lymphatic: Stable advanced atherosclerotic calcifications involving the aorta, iliac arteries and branch vessels but no aneurysm. No mesenteric or retroperitoneal mass or adenopathy. Reproductive: The uterus and ovaries are unremarkable for age. Other: No pelvic mass or pelvic lymphadenopathy. No inguinal lymphadenopathy. Musculoskeletal: No significant bony findings. No worrisome bone lesions. Remote healed right pubic rami fractures are noted. Small sclerotic areas in the greater trochanter and right ischium may be related to small bone infarcts. No change. IMPRESSION: 1. No CT findings without contrast to suggest recurrent lymphoma involving the chest, abdomen or pelvis. 2. Stable advanced atherosclerotic calcifications involving the thoracic and abdominal aorta and branch vessels. 3. Moderate to large hiatal hernia. 4. No significant bony findings. Electronically Signed   By: Marijo Sanes M.D.   On: 04/09/2019 11:36   Dg Chest 1 View  Result Date: 04/10/2019 CLINICAL DATA:  Apparent pain status post fall red EXAM: CHEST  1 VIEW COMPARISON:  CT from June 07/01/2019 FINDINGS: Again noted are  emphysematous changes bilaterally. There is a well-positioned right-sided Port-A-Cath. 2 there is no pneumothorax. The and the there is scarring at the lung bases. There is no acute osseous abnormality. Heart size is normal. Aortic calcifications are again seen. The no IMPRESSION: No active disease. Electronically Signed   By: Constance Holster M.D.   On: 04/10/2019 01:05   Ct Chest Wo Contrast  Result Date: 04/09/2019 CLINICAL DATA:  Non-Hodgkin's lymphoma. EXAM: CT CHEST, ABDOMEN AND PELVIS WITHOUT CONTRAST TECHNIQUE: Multidetector CT imaging of the chest, abdomen and pelvis was performed following the standard protocol without IV contrast. COMPARISON:  Multiple PET-CT scans from 2019 FINDINGS: CT CHEST FINDINGS Cardiovascular: The heart is normal in size. No pericardial effusion. Stable moderate tortuosity, ectasia and calcification of the thoracic  aorta. Stable coronary artery calcifications. Mediastinum/Nodes: No mediastinal or hilar mass or lymphadenopathy. The esophagus is grossly normal. There is a moderate to large hiatal hernia. Lungs/Pleura: Stable emphysematous changes and pulmonary scarring. No worrisome pulmonary lesions or pulmonary nodules. No pleural effusion or pleural lesions. Musculoskeletal: Degenerative changes and mild scoliosis and osteoporosis but no worrisome bone lesions. Remote healed sternal fracture. No breast masses, supraclavicular or axillary adenopathy. Extensive carotid artery calcifications are noted. The thyroid gland is grossly normal. The right Port-A-Cath is stable. CT ABDOMEN PELVIS FINDINGS Hepatobiliary: No focal hepatic lesions are identified without contrast. The gallbladder appears normal. No common bile duct dilatation. Pancreas: No mass, inflammation or ductal dilatation. Spleen: Normal size.  No focal lesions. Adrenals/Urinary Tract: The adrenal glands and kidneys are unremarkable. No renal, ureteral or bladder calculi or obvious mass without contrast.  Stomach/Bowel: The stomach, duodenum, small bowel and colon are unremarkable. No acute inflammatory changes, mass lesions or obstructive findings. Vascular/Lymphatic: Stable advanced atherosclerotic calcifications involving the aorta, iliac arteries and branch vessels but no aneurysm. No mesenteric or retroperitoneal mass or adenopathy. Reproductive: The uterus and ovaries are unremarkable for age. Other: No pelvic mass or pelvic lymphadenopathy. No inguinal lymphadenopathy. Musculoskeletal: No significant bony findings. No worrisome bone lesions. Remote healed right pubic rami fractures are noted. Small sclerotic areas in the greater trochanter and right ischium may be related to small bone infarcts. No change. IMPRESSION: 1. No CT findings without contrast to suggest recurrent lymphoma involving the chest, abdomen or pelvis. 2. Stable advanced atherosclerotic calcifications involving the thoracic and abdominal aorta and branch vessels. 3. Moderate to large hiatal hernia. 4. No significant bony findings. Electronically Signed   By: Marijo Sanes M.D.   On: 04/09/2019 11:36   Pelvis Portable  Result Date: 04/10/2019 CLINICAL DATA:  Postop EXAM: PORTABLE PELVIS 1-2 VIEWS COMPARISON:  04/10/2019 FINDINGS: Interval left hip replacement with normal alignment. Small amount of gas in the soft tissues consistent with recent surgery. Stable sclerotic foci within the right ischium and right trochanter of the femur. IMPRESSION: 1. Status post left hip replacement with expected postsurgical changes. Electronically Signed   By: Donavan Foil M.D.   On: 04/10/2019 20:59   Dg Chest Port 1 View  Result Date: 04/12/2019 CLINICAL DATA:  Hypoxia EXAM: PORTABLE CHEST 1 VIEW COMPARISON:  04/10/2019 FINDINGS: Cardiac shadow is stable. Right chest wall port is again seen. Aortic calcifications are noted. The lungs are hyperinflated consistent with COPD. No focal infiltrate is noted. No bony abnormality is seen. IMPRESSION: COPD  without acute abnormality. Electronically Signed   By: Inez Catalina M.D.   On: 04/12/2019 09:43   Dg C-arm 1-60 Min  Result Date: 04/10/2019 CLINICAL DATA:  Total hip replacement EXAM: DG C-ARM 61-120 MIN; OPERATIVE LEFT HIP WITH PELVIS COMPARISON:  04/10/2019 FINDINGS: Two low resolution intraoperative spot views of the left hip. Total fluoroscopy time was 10 seconds. The images demonstrate a left hip replacement with normal alignment. IMPRESSION: Intraoperative fluoroscopic assistance provided during left hip replacement surgery Electronically Signed   By: Donavan Foil M.D.   On: 04/10/2019 20:58   Dg Hip Operative Unilat With Pelvis Left  Result Date: 04/10/2019 CLINICAL DATA:  Total hip replacement EXAM: DG C-ARM 61-120 MIN; OPERATIVE LEFT HIP WITH PELVIS COMPARISON:  04/10/2019 FINDINGS: Two low resolution intraoperative spot views of the left hip. Total fluoroscopy time was 10 seconds. The images demonstrate a left hip replacement with normal alignment. IMPRESSION: Intraoperative fluoroscopic assistance provided during left hip replacement surgery  Electronically Signed   By: Donavan Foil M.D.   On: 04/10/2019 20:58   Dg Hip Unilat With Pelvis 2-3 Views Left  Result Date: 04/10/2019 CLINICAL DATA:  Fall EXAM: DG HIP (WITH OR WITHOUT PELVIS) 2-3V LEFT COMPARISON:  None. FINDINGS: There is an acute impacted transcervical fracture of the proximal left femur. There are stable sclerotic lesions in the right ischium and right femur. A large amount of stool throughout the colon is noted. IMPRESSION: Acute impacted transcervical fracture of the proximal left femur Electronically Signed   By: Constance Holster M.D.   On: 04/10/2019 01:04    Microbiology: Recent Results (from the past 240 hour(s))  SARS Coronavirus 2 (CEPHEID - Performed in [place] hospital lab), Hosp Order     Status: None   Collection Time: 04/10/19  1:17 AM   Specimen: Nasopharyngeal Swab  Result Value Ref Range Status    SARS Coronavirus 2 NEGATIVE NEGATIVE Final    Comment: (NOTE) If result is NEGATIVE SARS-CoV-2 target nucleic acids are NOT DETECTED. The SARS-CoV-2 RNA is generally detectable in upper and lower  respiratory specimens during the acute phase of infection. The lowest  concentration of SARS-CoV-2 viral copies this assay can detect is 250  copies / mL. A negative result does not preclude SARS-CoV-2 infection  and should not be used as the sole basis for treatment or other  patient management decisions.  A negative result may occur with  improper specimen collection / handling, submission of specimen other  than nasopharyngeal swab, presence of viral mutation(s) within the  areas targeted by this assay, and inadequate number of viral copies  (<250 copies / mL). A negative result must be combined with clinical  observations, patient history, and epidemiological information. If result is POSITIVE SARS-CoV-2 target nucleic acids are DETECTED. The SARS-CoV-2 RNA is generally detectable in upper and lower  respiratory specimens dur ing the acute phase of infection.  Positive  results are indicative of active infection with SARS-CoV-2.  Clinical  correlation with patient history and other diagnostic information is  necessary to determine patient infection status.  Positive results do  not rule out bacterial infection or co-infection with other viruses. If result is PRESUMPTIVE POSTIVE SARS-CoV-2 nucleic acids MAY BE PRESENT.   A presumptive positive result was obtained on the submitted specimen  and confirmed on repeat testing.  While 2019 novel coronavirus  (SARS-CoV-2) nucleic acids may be present in the submitted sample  additional confirmatory testing may be necessary for epidemiological  and / or clinical management purposes  to differentiate between  SARS-CoV-2 and other Sarbecovirus currently known to infect humans.  If clinically indicated additional testing with an alternate test    methodology [phone]) is advised. The SARS-CoV-2 RNA is generally  detectable in upper and lower respiratory sp ecimens during the acute  phase of infection. The expected result is Negative. Fact Sheet for Patients:  StrictlyIdeas.no Fact Sheet for Healthcare Providers: BankingDealers.co.za This test is not yet approved or cleared by the [place] FDA and has been authorized for detection and/or diagnosis of SARS-CoV-2 by FDA under an Emergency Use Authorization (EUA).  This EUA will remain in effect (meaning this test can be used) for the duration of the COVID-19 declaration under Section 564(b)(1) of the Act, 21 U.S.C. section 360bbb-3(b)(1), unless the authorization is terminated or revoked sooner. Performed at [facility], [place] [address], [place], [place] [address]   Surgical pcr screen     Status: None  Collection Time: 04/10/19 10:15 AM   Specimen: Nasal Mucosa; Nasal Swab  Result Value Ref Range Status   MRSA, PCR NEGATIVE NEGATIVE Final   Staphylococcus aureus NEGATIVE NEGATIVE Final    Comment: (NOTE) The Xpert SA Assay (FDA approved for NASAL specimens in patients 58 years of age and older), is one component of a comprehensive surveillance program. It is not intended to diagnose infection nor to guide or monitor treatment. Performed at [facility], [place] [address] [place], [place] [address]      Labs: Basic Metabolic Panel: Recent Labs  Lab 04/09/19 0905 04/09/19 2339 04/10/19 0620 04/11/19 0437 04/12/19 0550  NA 142 141 138 139 140  K 3.7 3.6 3.8 4.1 3.6  CL 103 106 102 102 104  CO2 28 24 27 29 29   GLUCOSE 82 108* 124* 160* 95  BUN 10 12 10 14 17   CREATININE 0.75 0.62 0.82 0.98 0.89  CALCIUM 9.2 8.5* 8.6* 8.7* 8.7*   Liver Function Tests: Recent Labs  Lab 04/09/19 0905 04/10/19 0620  AST 20 24  ALT 12 16  ALKPHOS 124 105  BILITOT 0.6 1.1  PROT 6.1* 5.7*  ALBUMIN 4.0  3.9   No results for input(s): LIPASE, AMYLASE in the last 168 hours. No results for input(s): AMMONIA in the last 168 hours. CBC: Recent Labs  Lab 04/09/19 0905 04/09/19 2339 04/10/19 0620 04/11/19 0437 04/12/19 0550 04/12/19 1833 04/13/19 0557 04/13/19 2055  WBC 5.3 5.8 8.2 5.0 5.3  --  4.8  --   NEUTROABS 2.5 3.6 5.9 4.1  --   --   --   --   HGB 13.2 12.9 13.0 8.4* 7.4*  --  7.0* 10.7*  HCT 39.4 38.3 38.4 25.4* 22.7* 20.2* 21.1* 31.0*  MCV 101.0* 103.2* 101.3* 104.1* 103.7*  --  105.5*  --   PLT 218 203 207 143* 123*  --  127*  --    Cardiac Enzymes: No results for input(s): CKTOTAL, CKMB, CKMBINDEX, TROPONINI in the last 168 hours. BNP: BNP (last 3 results) No results for input(s): BNP in the last 8760 hours.  ProBNP (last 3 results) No results for input(s): PROBNP in the last 8760 hours.  CBG: Recent Labs  Lab 04/12/19 0627 04/12/19 1206 04/12/19 2056 04/13/19 0742 04/13/19 1252  GLUCAP 94 90 109* 97 139*       Signed:  Kayleen Memos, MD [facility] 04/14/2019, 7:44 AM

## 2019-04-14 NOTE — Progress Notes (Signed)
Patient is discharged from room 4NP13 and transferred to 4W19 at this time. Alert and in stable condition. Report given to receiving nurse Santiago Glad, RN with understanding verbalized. Left unit via bed with all belongings at side.

## 2019-04-14 NOTE — Progress Notes (Signed)
Inpatient Rehabilitation Admissions Coordinator  I left voicemail to notify spouse of pt's room number, phone number and number to reach nursing staff for any questions.  Danne Baxter, RN, MSN Rehab Admissions Coordinator [phone] 04/14/2019 4:35 PM

## 2019-04-14 NOTE — Progress Notes (Signed)
Physical Therapy Treatment Patient Details Name: Brooke Washington MRN: [id number] DOB: 1941/02/11 Today's Date: 04/14/2019    History of Present Illness Pt is a 78 y.o. F with significant PMH of CA, COPD who presents after a fall with a displaced left femoral neck fracture s/p left total hip arthroplasty anterior approach.    PT Comments    Patient progressing well towards PT goals. Reports feeling tired but willing to work with therapy. Tolerated gait training with Min-Mod A for balance/safety and max cues for RW management. SP02 dropped to 86% on 2L/min 02 and HR up to 124 bpm. Very slow with mobility and needs cues for upright posture. Does well being pushed. Tolerated there ex sitting in chair without difficulty. Will continue to follow and progress as tolerated.   Follow Up Recommendations  CIR     Equipment Recommendations  Rolling walker with 5" wheels;3in1 (PT)    Recommendations for Other Services       Precautions / Restrictions Precautions Precautions: Fall Precaution Comments: watch O2 Restrictions Weight Bearing Restrictions: Yes LLE Weight Bearing: Weight bearing as tolerated    Mobility  Bed Mobility Overal bed mobility: Needs Assistance Bed Mobility: Supine to Sit     Supine to sit: Mod assist;HOB elevated     General bed mobility comments: Able to initiate bringing LLE to EOB but needed assist; assist with bottom and trunk.  Transfers Overall transfer level: Needs assistance Equipment used: Rolling walker (2 wheeled) Transfers: Sit to/from Stand Sit to Stand: Mod assist;+2 physical assistance         General transfer comment: Assist of 2 to power to standing from EOB x1. Cues for hand placement/technique. Cues for upright posture. Favors placing weight through RLE. Transferred to chair post ambulation.  Ambulation/Gait Ambulation/Gait assistance: Min assist;+2 safety/equipment Gait Distance (Feet): 20 Feet Assistive device: Rolling walker (2  wheeled) Gait Pattern/deviations: Trunk flexed;Step-to pattern;Decreased step length - right;Decreased step length - left;Decreased stride length;Decreased weight shift to left;Step-through pattern Gait velocity: decreased   General Gait Details: Slow, mildly unsteady gait with RW for support; cues for RW proximity/management. Assist with pushing RW forward and cues for upright posture. SP02 dropped to 86% on 2L/min 02. HR up to 124 bpm.   Stairs             Wheelchair Mobility    Modified Rankin (Stroke Patients Only)       Balance Overall balance assessment: Needs assistance Sitting-balance support: Feet supported;No upper extremity supported Sitting balance-Leahy Scale: Fair     Standing balance support: During functional activity Standing balance-Leahy Scale: Poor Standing balance comment: reliant on external support and BUEs on RW.                            Cognition Arousal/Alertness: Awake/alert Behavior During Therapy: WFL for tasks assessed/performed Overall Cognitive Status: Within Functional Limits for tasks assessed                                        Exercises Total Joint Exercises Ankle Circles/Pumps: Both;10 reps;Supine Hip ABduction/ADduction: Both;10 reps;Seated Long Arc Quad: Left;10 reps;Seated    General Comments General comments (skin integrity, edema, etc.): SP02 ranged from 86-93% on 2L/min 02; HR up to 124 bpm.      Pertinent Vitals/Pain Pain Assessment: 0-10 Pain Score: 8  Pain Location: incisional Pain Descriptors /  Indicators: Sore;Operative site guarding Pain Intervention(s): Repositioned;Monitored during session;Limited activity within patient's tolerance    Home Living                      Prior Function            PT Goals (current goals can now be found in the care plan section) Progress towards PT goals: Progressing toward goals    Frequency    Min 5X/week      PT Plan  Current plan remains appropriate    Co-evaluation              AM-PAC PT "6 Clicks" Mobility   Outcome Measure  Help needed turning from your back to your side while in a flat bed without using bedrails?: A Little Help needed moving from lying on your back to sitting on the side of a flat bed without using bedrails?: A Lot Help needed moving to and from a bed to a chair (including a wheelchair)?: A Lot Help needed standing up from a chair using your arms (e.g., wheelchair or bedside chair)?: A Lot Help needed to walk in hospital room?: A Lot Help needed climbing 3-5 steps with a railing? : Total 6 Click Score: 12    End of Session Equipment Utilized During Treatment: Gait belt Activity Tolerance: Patient tolerated treatment well Patient left: in chair;with call bell/phone within reach;with chair alarm set Nurse Communication: Mobility status PT Visit Diagnosis: Other abnormalities of gait and mobility (R26.89);Muscle weakness (generalized) (M62.81);Difficulty in walking, not elsewhere classified (R26.2);Pain Pain - Right/Left: Left Pain - part of body: Hip     Time: 2956-2130 PT Time Calculation (min) (ACUTE ONLY): 24 min  Charges:  $Gait Training: 23-37 mins                     Wray Kearns, PT, DPT Acute Rehabilitation Services Pager [phone] Office [place] 04/14/2019, 10:30 AM

## 2019-04-14 NOTE — Progress Notes (Signed)
Patient admitted about 1600 to [phone]. Patient alert and oriented 4, no signs of distress. Patient given rehab notebook and educated on fall safety and rehab safety plan. All questions answered. Call bell and phone left within reach. Patient denies pain.

## 2019-04-15 ENCOUNTER — Inpatient Hospital Stay (HOSPITAL_COMMUNITY): Payer: Medicare Other

## 2019-04-15 ENCOUNTER — Encounter (HOSPITAL_COMMUNITY): Payer: Self-pay | Admitting: Physical Medicine & Rehabilitation

## 2019-04-15 DIAGNOSIS — Z9981 Dependence on supplemental oxygen: Secondary | ICD-10-CM

## 2019-04-15 DIAGNOSIS — J449 Chronic obstructive pulmonary disease, unspecified: Secondary | ICD-10-CM

## 2019-04-15 DIAGNOSIS — Z8781 Personal history of (healed) traumatic fracture: Secondary | ICD-10-CM

## 2019-04-15 DIAGNOSIS — R739 Hyperglycemia, unspecified: Secondary | ICD-10-CM

## 2019-04-15 DIAGNOSIS — S7292XA Unspecified fracture of left femur, initial encounter for closed fracture: Secondary | ICD-10-CM | POA: Insufficient documentation

## 2019-04-15 DIAGNOSIS — D62 Acute posthemorrhagic anemia: Secondary | ICD-10-CM

## 2019-04-15 HISTORY — DX: Personal history of (healed) traumatic fracture: Z87.81

## 2019-04-15 MED ORDER — FLUTICASONE FUROATE-VILANTEROL 100-25 MCG/INH IN AEPB
1.0000 | INHALATION_SPRAY | Freq: Every day | RESPIRATORY_TRACT | Status: DC
  Administered 2019-04-15 – 2019-04-25 (×10): 1 via RESPIRATORY_TRACT
  Filled 2019-04-15: qty 28

## 2019-04-15 MED ORDER — CITALOPRAM HYDROBROMIDE 20 MG PO TABS
20.0000 mg | ORAL_TABLET | Freq: Every day | ORAL | Status: DC
  Administered 2019-04-16 – 2019-04-17 (×2): 20 mg via ORAL
  Filled 2019-04-15 (×2): qty 1

## 2019-04-15 NOTE — Plan of Care (Signed)
  Problem: Consults Goal: RH GENERAL PATIENT EDUCATION Description: See Patient Education module for education specifics. Outcome: Progressing Goal: Nutrition Consult-if indicated Outcome: Progressing   Problem: RH BOWEL ELIMINATION Goal: RH STG MANAGE BOWEL WITH ASSISTANCE Description: STG Manage Bowel with min Assistance. Outcome: Progressing Goal: RH STG MANAGE BOWEL W/MEDICATION W/ASSISTANCE Description: STG Manage Bowel with Medication with min Assistance. Outcome: Progressing   Problem: RH BLADDER ELIMINATION Goal: RH STG MANAGE BLADDER WITH ASSISTANCE Description: STG Manage Bladder With min Assistance Outcome: Progressing   Problem: RH SKIN INTEGRITY Goal: RH STG SKIN FREE OF INFECTION/BREAKDOWN Outcome: Progressing Goal: RH STG ABLE TO PERFORM INCISION/WOUND CARE W/ASSISTANCE Description: STG Able To Perform Incision/Wound Care With min Assistance. Outcome: Progressing   Problem: RH SAFETY Goal: RH STG ADHERE TO SAFETY PRECAUTIONS W/ASSISTANCE/DEVICE Description: STG Adhere to Safety Precautions With mod I Assistance/Device. Outcome: Progressing   Problem: RH PAIN MANAGEMENT Goal: RH STG PAIN MANAGED AT OR BELOW PT'S PAIN GOAL Description: Less than 3 out of 10 Outcome: Progressing

## 2019-04-15 NOTE — Progress Notes (Signed)
[place] PHYSICAL MEDICINE & REHABILITATION PROGRESS NOTE  Subjective/Complaints: Patient working with therapy this morning.  She states she slept well overnight.  She is a little confused about her rehab length of stay.  Discussed oxygen saturations as well as urinary retention as well as home inhaler with nursing.  ROS: Denies CP, shortness of breath, nausea, vomiting, diarrhea.  Objective: Vital Signs: Blood pressure (!) 142/89, pulse 88, temperature 98.4 F (36.9 C), temperature source Oral, resp. rate 18, height 5\' 4"  (1.626 m), weight 57.8 kg, SpO2 91 %. No results found. Recent Labs    04/13/19 0557 04/13/19 2055  WBC 4.8  --   HGB 7.0* 10.7*  HCT 21.1* 31.0*  PLT 127*  --    No results for input(s): NA, K, CL, CO2, GLUCOSE, BUN, CREATININE, CALCIUM in the last 72 hours.  Physical Exam: BP (!) 142/89 (BP Location: Left Arm)   Pulse 88   Temp 98.4 F (36.9 C) (Oral)   Resp 18   Ht 5\' 4"  (1.626 m)   Wt 57.8 kg   LMP  (LMP Unknown)   SpO2 91%   BMI 21.87 kg/m  Constitutional: No distress . Vital signs reviewed. HENT: Normocephalic.  Atraumatic. Eyes: EOMI. No discharge. Cardiovascular: No JVD. Respiratory: Normal effort. GI: Non-distended. Musc: Left hip with edema and tenderness  Neurological: Alert and oriented Motor: Bilateral upper extremities: 5/5 proximal to distal Right lower extremity: Hip flexion 4--4/5, knee extension 4+/5, ankle dorsiflexion 5/5 Left lower extremity: Hip flexion 4-/5, knee extension 4/5, and dorsiflexion 5/5 Skin:  Petechial lesions left thigh. Surgical dressing left hip C/D/I.   Psychiatric: She has a normal mood and affect. Her behavior is normal.   Assessment/Plan: 1. Functional deficits secondary to left hip fracture status post left THA  which require 3+ hours per day of interdisciplinary therapy in a comprehensive inpatient rehab setting.  Physiatrist is providing close team supervision and 24 hour management of active  medical problems listed below.  Physiatrist and rehab team continue to assess barriers to discharge/monitor patient progress toward functional and medical goals  Care Tool:  Bathing    Body parts bathed by patient: Right arm, Left arm, Chest, Abdomen, Front perineal area, Buttocks, Right upper leg, Left upper leg, Right lower leg, Left lower leg, Face         Bathing assist Assist Level: Minimal Assistance - Patient > 75%     Upper Body Dressing/Undressing Upper body dressing   What is the patient wearing?: Pull over shirt    Upper body assist Assist Level: Supervision/Verbal cueing    Lower Body Dressing/Undressing Lower body dressing      What is the patient wearing?: Pants, Underwear/pull up     Lower body assist Assist for lower body dressing: Moderate Assistance - Patient 50 - 74%     Toileting Toileting    Toileting assist Assist for toileting: Moderate Assistance - Patient 50 - 74%     Transfers Chair/bed transfer  Transfers assist     Chair/bed transfer assist level: Moderate Assistance - Patient 50 - 74%     Locomotion Ambulation   Ambulation assist              Walk 10 feet activity   Assist           Walk 50 feet activity   Assist           Walk 150 feet activity   Assist  Walk 10 feet on uneven surface  activity   Assist           Wheelchair     Assist               Wheelchair 50 feet with 2 turns activity    Assist            Wheelchair 150 feet activity     Assist            Medical Problem List and Plan: 1.  Deficits with mobility, transfers, self-care secondary to left hip fracture status post left THA on 04/10/2019.  Begin CIR evaluations 2.  Antithrombotics: -DVT/anticoagulation:  Pharmaceutical: Lovenox             -antiplatelet therapy: ASA for DVT prophylaxis after discharge 3. Pain Management: Hydrocodone as needed 4. Mood: LCSW to follow for  evaluation and support             -antipsychotic agents: N/A 5. Neuropsych: This patient is capable of making decisions on her own behalf.  Celexa 20 daily 6. Skin/Wound Care: Monitor wound for healing.  Nutritional supplements added. 7. Fluids/Electrolytes/Nutrition: Monitor I/O.  BMP ordered for tomorrow. 8. Hypoxic respiratory failure: COPD per films--encourage IS.    As needed inhaler ordered  Wean supplemental oxygen as tolerated. 9. ABLA: Status post transfusion on 04/13/2019.  Continue iron supplement.    Hemoglobin 10.7 on 7/3 after 2 units PRBC on 7/30  CBC ordered for tomorrow 10. Persistent Hypotension: Likely due to anemia--transfused with 2 units packed red blood cells on 7/3.    Monitor with increased mobility 11. H/o lymphoma: To hold Revlimid for now.  Follow-up with heme-onc for resumption after discharge 12. Constipation: Continue bowel regimen. 13.  Hyperglycemia  Continue to monitor  LOS: 1 days A FACE TO FACE EVALUATION WAS PERFORMED  Ankit Lorie Phenix 04/15/2019, 9:34 AM

## 2019-04-15 NOTE — Progress Notes (Signed)
Occupational Therapy Session Note  Patient Details  Name: Brooke Washington MRN: [id number] Date of Birth: Jul 20, 1941  Today's Date: 04/15/2019 OT Individual Time: 5732-2567 OT Individual Time Calculation (min): 60 min    Short Term Goals: Week 1:  OT Short Term Goal 1 (Week 1): Pt will thread BLE through pants with CGA using LRAD OT Short Term Goal 2 (Week 1): Pt will transfer to [facility] over toilet with min A OT Short Term Goal 3 (Week 1): Pt will complete tub transfer with TTB with min A  Skilled Therapeutic Interventions/Progress Updates:    Pt received supine agreeable to therapy, with c/o pain only in L hip described as sore. Pt completed bed mobility to EOB with CGA. Pt completed stand pivot transfer to w/c with min cueing and min A. Pt transferred onto [facility] over toilet with cueing for UE placement. Pt able to void bowel and bladder and complete all peri hygiene from seated with lateral leans. Pt returned to w/c and was transported to therapy gym. Pt completed standing level functional reaching task to promote more upright posture. Pt required mod cueing and demonstration to bring pelvis into neutral and to stand upright. Pt then transferred supine on the mat with a wedge and pillow to ensure comfort. Using maxi slide pt completed LLE abduction, adduction, and knee flex/ext with min-mod manual facilitation. Pt developing stiffness in her operative hip and edu was provided on importance of early mobilization. Pt returned to w/c and was returned to room. She transferred back to supine and was left with all needs met, bed alarm set.   Therapy Documentation Precautions:  Precautions Precautions: Fall Precaution Comments: watch O2 Restrictions Weight Bearing Restrictions: Yes LLE Weight Bearing: Weight bearing as tolerated   Therapy/Group: Individual Therapy  Curtis Sites 04/15/2019, 4:39 PM

## 2019-04-15 NOTE — Evaluation (Signed)
Physical Therapy Assessment and Plan  Patient Details  Name: Brooke Washington MRN: [id number] Date of Birth: [birth year]  PT Diagnosis: Abnormality of gait, Difficulty walking and Muscle weakness Rehab Potential:   ELOS:     Today's Date: 04/15/2019 PT Individual Time: 1110-1220 PT Individual Time Calculation (min): 70 min    Problem List:  Patient Active Problem List   Diagnosis Date Noted  . S/p left hip fracture 04/15/2019  . Hyperglycemia   . Chronic obstructive pulmonary disease ([place])   . Acute blood loss anemia   . Drug induced constipation   . Status post total hip replacement, left   . Supplemental oxygen dependent   . Postoperative pain   . Closed left hip fracture, initial encounter ([place]) 04/10/2019  . Other fatigue 04/09/2019  . Leukopenia due to antineoplastic chemotherapy ([place]) 01/25/2019  . Bilateral arm pain 12/14/2018  . Pancytopenia, acquired ([place]) 06/20/2018  . Weight loss 06/20/2018  . Physical deconditioning 04/10/2018  . 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 Hypotension, unspecified 03/29/2018  . Chronic respiratory failure with hypoxia ([place]) 12/30/2017  . Other constipation 12/29/2017  . Neutropenic fever ([place]) 12/18/2017  . Thrombocytopenia ([place]) 12/18/2017  . Abdominal fullness 12/18/2017  . Acute urinary retention 12/18/2017  . Mucositis due to antineoplastic therapy 12/15/2017  . Heart burn 12/15/2017  . Frequent headaches 12/15/2017  . 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 Goals of care, counseling/discussion 11/29/2017  . Pathological fracture in neoplastic disease, right humerus, initial encounter for fracture 11/24/2017  . Hyperuricemia 11/22/2017  . Pathological fracture due to neoplastic disease, initial encounter 11/21/2017  . COPD GOLD II 07/05/2017  . COLONIC POLYPS, HYPERPLASTIC, HX OF 05/28/2010  . 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 Depression 05/05/2007    Past Medical  History:  Past Medical History:  Diagnosis Date  . Cancer ([place])  APRIL OF 2000   LOW-GRADE NON-HODGKIN'S LYMPHOMA  . COPD (chronic obstructive pulmonary disease) ([place])   . Depression   . Emphysema of lung ([place])   . GERD (gastroesophageal reflux disease)   . Hyperlipidemia   . S/p left hip fracture 04/15/2019   Past Surgical History:  Past Surgical History:  Procedure Laterality Date  . 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 FRACTURE SURGERY    . IR FLUORO GUIDE PORT INSERTION RIGHT  12/07/2017  . IR US GUIDE VASC ACCESS RIGHT  12/07/2017  . ORIF HUMERUS FRACTURE Right 11/23/2017   Procedure: OPEN REDUCTION INTERNAL FIXATION (ORIF) HUMERAL SHAFT FRACTURE;  Surgeon: Shona Needles, MD;  Location: [facility];  Service: Orthopedics;  Laterality: Right;  . RIGHT OOPHORECTOMY  1976  . 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   Procedure: TOTAL HIP ARTHROPLASTY ANTERIOR APPROACH LEFT HIP;  Surgeon: Rod Can, MD;  Location: [place];  Service: Orthopedics;  Laterality: Left;    Assessment & Plan Clinical Impression: TIANNAH GREENLY is a 78 year old female with history of COPD and lymphoma who was admitted on 04/10/2019 after losing her balance and sustaining a fall onto left hip with onset of pain.  History taken from chart review and patient.  X-rays done in ED revealing left transcervical proximal femur fracture and patient underwent left total hip arthroplasty with anterior approach by Dr. Lyla Glassing on 03/14/2019.  Postop to be weightbearing as tolerated and on aspirin for DVT prophylaxis.  She has had issues with hypoxia requiring  supplemental oxygen as well as issues with hypotension.  Hospital course further complicated by acute blood loss anemia and hematology/oncology recommended holding holding Revlimid.  She received a blood transfusion on 04/13/2019.Marland Kitchen  Patient transferred to CIR on 04/14/2019 .   Patient currently requires  mod with mobility secondary to muscle weakness, muscle joint tightness and pain.  Prior to hospitalization, patient was independent  with mobility and lived with Spouse in a House home.  Home access is 1Level entry, Stairs to enter.  Patient will benefit from skilled PT intervention to maximize safe functional mobility, minimize fall risk and decrease caregiver burden for planned discharge home with 24 hour supervision.  Anticipate patient will benefit from follow up [facility] at discharge.  PT - End of Session Endurance Deficit: Yes Endurance Deficit Description: generalized weakness  Skilled Therapeutic Intervention Patient in supine and reports fatigued after ADL this morning.  Performed in bed therex consisting of ankle pumps, heel slides, SAQ and hip abduction AROM/AAROM.  Patient supine to sit with elevated HOB with mod A for assist for R LE and to lift trunk.  Sit to stand with RW and mod A for lifting help.  Ambulated to door 20' with RW and increased time, cues for posture as flexed at hips and noted initial contact with foot flat or toes first due to limited ankle DF strength/ROM.  Patient with SpO2 81% after ambulating and improved on RW to 95% after resting 2 minutes with PLB and using incentive.  PAtient propelled w/c x 60' with S and cues and increased time.  Assisted to gym and demonstrated negotiating 1 step to simulate home entry with RW and pt performed with min to mod A for safety with balance, walker management and cues for sequence.  Patient ambulated another 60' with 2 turns back to w/c.  Declined car transfer due to fatigue.  Assisted to room and performed toilet transfer with mod A using grabbar.  Seated for hygiene with S.  Mod A sit to stand and clothing management.  Patient assisted to EOB using bed rail and mod A.  Seated for lunch, then requested to supine.  Assisted to supine mod A for LE's and positioning.  Ice applied to L hip and left with call bell in reach and bed alarm  activated.    PT Evaluation Precautions/Restrictions Precautions Precautions: Fall Precaution Comments: watch O2 Restrictions Weight Bearing Restrictions: Yes LLE Weight Bearing: Weight bearing as tolerated   Vital Signs Therapy Vitals Pulse Rate: (!) 112 Resp: 16 BP: (!) 146/68 Patient Position (if appropriate): Sitting Oxygen Therapy SpO2: (!) 81 %(increased to 95 after sitting PLB x 2 min & IS) O2 Device: Room Air Patient Activity (if Appropriate): In chair(after ambulation) Pain Pain Assessment Pain Scale: 0-10 Pain Score: 0-No pain Home Living/Prior Functioning Home Living Available Help at Discharge: Family;Available 24 hours/day Type of Home: House Home Access: Level entry;Stairs to enter Entrance Stairs-Number of Steps: 1 Home Layout: Two level;Able to live on main level with bedroom/bathroom Bathroom Shower/Tub: Tub/shower unit;Walk-in shower Bathroom Toilet: Handicapped height Bathroom Accessibility: Yes Additional Comments: Pt's daughter works for [employer] With: Spouse Prior Function Level of Independence: Independent with basic ADLs;Independent with gait;Independent with homemaking with ambulation Driving: No Vocation: Retired Comments: Reports used to work out regularly with husband at [employer] prior to [employer] Vision/Perception  Perception Perception: Within Advertising copywriter Praxis Praxis: Intact  Cognition Overall Cognitive Status: Within Functional Limits for tasks assessed Arousal/Alertness: Awake/alert Orientation Level: Oriented X4 Attention:  Sustained Sustained Attention: Appears intact Memory: Appears intact Awareness: Appears intact Problem Solving: Appears intact Safety/Judgment: Appears intact Sensation Sensation Light Touch: Impaired Detail Central sensation comments: Reports some neuropathy in her toes but reports no acute changes s/p sx Hot/Cold: Appears Intact Coordination Gross Motor Movements are Fluid and  Coordinated: No Fine Motor Movements are Fluid and Coordinated: Yes Coordination and Movement Description: guarding 2/2 pain and generalized weakness Motor  Motor Motor: Other (comment) Motor - Skilled Clinical Observations: Generalized weakness  Mobility Bed Mobility Bed Mobility: Supine to Sit Supine to Sit: Minimal Assistance - Patient > 75% Transfers Transfers: Sit to Stand;Stand to Sit;Stand Pivot Transfers Sit to Stand: Moderate Assistance - Patient 50-74% Stand to Sit: Moderate Assistance - Patient 50-74% Stand Pivot Transfers: Moderate Assistance - Patient 50 - 74% Stand Pivot Transfer Details: Verbal cues for technique;Verbal cues for safe use of DME/AE;Verbal cues for precautions/safety Transfer (Assistive device): Rolling walker Locomotion  Gait Ambulation: Yes Gait Assistance: Minimal Assistance - Patient > 75%;Moderate Assistance - Patient 50-74% Gait Distance (Feet): 20 Feet Assistive device: Rolling walker Gait Assistance Details: Verbal cues for technique;Verbal cues for sequencing;Verbal cues for safe use of DME/AE Gait Assistance Details: assist initially for keeping walker close and for balance, improved with practice Stairs / Additional Locomotion Stairs: Yes Stairs Assistance: Minimal Assistance - Patient > 75% Stair Management Technique: Step to pattern;Forwards;With walker Number of Stairs: 1 Height of Stairs: 4 Wheelchair Mobility Wheelchair Mobility: Yes Wheelchair Assistance: Chartered loss adjuster: Both upper extremities Wheelchair Parts Management: Needs assistance Distance: 78'  Trunk/Postural Assessment  Cervical Assessment Cervical Assessment: Exceptions to WFL(forward head) Thoracic Assessment Thoracic Assessment: Exceptions to WFL(rounded shoulders, kyphotic posture) Lumbar Assessment Lumbar Assessment: Exceptions to WFL(posterior pelvic tilt) Postural Control Postural Control: Deficits on evaluation Righting  Reactions: delayed  Balance Balance Balance Assessed: Yes Static Sitting Balance Static Sitting - Balance Support: No upper extremity supported Static Sitting - Level of Assistance: 6: Modified independent (Device/Increase time) Dynamic Sitting Balance Dynamic Sitting - Balance Support: Feet supported;During functional activity Dynamic Sitting - Level of Assistance: 5: Stand by assistance Static Standing Balance Static Standing - Level of Assistance: 4: Min assist Dynamic Standing Balance Dynamic Standing - Balance Support: During functional activity;Bilateral upper extremity supported;No upper extremity supported Dynamic Standing - Level of Assistance: 3: Mod assist;4: Min assist Dynamic Standing - Comments: standing to pull up and fasten pants Extremity Assessment  RUE Assessment RUE Assessment: Within Functional Limits General Strength Comments: Overall functional, deferred MMT 2/2 reported bone involvement from cx LUE Assessment LUE Assessment: Within Functional Limits General Strength Comments: Overall functional, deferred MMT 2/2 reported bone involvement from cx RLE Assessment RLE Assessment: Exceptions to [facility] Passive Range of Motion (PROM) Comments: ankle DF limited to about neutral PROM Active Range of Motion (AROM) Comments: WFL except ankle DF General Strength Comments: hip flexion 4/5, knee extension 4+/5, ankle DF 3-/5 LLE Assessment LLE Assessment: Exceptions to [facility] Passive Range of Motion (PROM) Comments: ankle DF limited to about 10 degrees from neutral PROM Active Range of Motion (AROM) Comments: Limited by pain with AAROM About 60 degrees hip flexion in supine, knee flexion at least 90 in sitting, ankle DF as noted above General Strength Comments: hip flexion 2-/5, knee extension 3/5, ankle DF about 3-/5    Refer to Care Plan for Long Term Goals  Recommendations for other services: None   Discharge Criteria: Patient will be discharged from PT if patient  refuses treatment 3 consecutive times without medical reason, if  treatment goals not met, if there is a change in medical status, if patient makes no progress towards goals or if patient is discharged from hospital.  The above assessment, treatment plan, treatment alternatives and goals were discussed and mutually agreed upon: by patient  Reginia Naas 04/15/2019, 4:03 PM  Magda Kiel, PT

## 2019-04-15 NOTE — IPOC Note (Addendum)
Individualized overall Plan of Care [facility]) Patient Details Name: Brooke Washington MRN: [id number] DOB: 07/15/1941  Admitting Diagnosis: S/p left hip fracture  Hospital Problems: Principal Problem:   S/p left hip fracture Active Problems:   Hyperglycemia   Chronic obstructive pulmonary disease ([place])     Functional Problem List: Nursing Medication Management, Pain, Skin Integrity, Bladder  PT Balance, Endurance, Pain, Motor, Safety  OT Balance, Pain, Safety, Edema, Endurance, Motor  SLP    TR         Basic ADL's: OT Bathing, Dressing, Toileting     Advanced  ADL's: OT       Transfers: PT Bed Mobility, Furniture, Bed to Chair, Teacher, early years/pre, Metallurgist: PT Ambulation, Stairs, Emergency planning/management officer     Additional Impairments: OT None  SLP        TR      Anticipated Outcomes Item Anticipated Outcome  Self Feeding no goal set  Swallowing      Basic self-care  (S)  Toileting  (S)   Bathroom Transfers (S)  Bowel/Bladder  min assist  Transfers  S  Locomotion  S w/ RW  Communication     Cognition     Pain  less than 3 out of 10  Safety/Judgment  mod I   Therapy Plan: PT Intensity: Minimum of 1-2 x/day ,45 to 90 minutes PT Frequency: 5 out of 7 days PT Duration Estimated Length of Stay: 2-2.5 weeks OT Intensity: Minimum of 1-2 x/day, 45 to 90 minutes OT Frequency: 5 out of 7 days OT Duration/Estimated Length of Stay: 14-16 days      Team Interventions: Nursing Interventions Patient/Family Education, Medication Management, Skin Care/Wound Management, Bowel Management, Bladder Management, Pain Management  PT interventions Ambulation/gait training, Balance/vestibular training, DME/adaptive equipment instruction, Functional mobility training, Neuromuscular re-education, Patient/family education, Pain management, Stair training, Therapeutic Exercise, Therapeutic Activities, UE/LE Strength taining/ROM, Wheelchair propulsion/positioning   OT Interventions Balance/vestibular training, Discharge planning, Pain management, Self Care/advanced ADL retraining, Therapeutic Activities, Disease mangement/prevention, Functional mobility training, Patient/family education, Therapeutic Exercise, Community reintegration, Engineer, drilling, Psychosocial support, UE/LE Strength taining/ROM, Wheelchair propulsion/positioning  SLP Interventions    TR Interventions    SW/CM Interventions Discharge Planning, Psychosocial Support, Patient/Family Education   Barriers to Discharge MD  Medical stability and Wound care  Nursing Weight bearing restrictions    PT      OT      SLP      SW       Team Discharge Planning: Destination: PT-Home ,OT- Home , SLP-  Projected Follow-up: PT-Home health PT, 24 hour supervision/assistance, OT-  Home health OT, SLP-  Projected Equipment Needs: PT-Rolling walker with 5" wheels, OT- 3 in 1 bedside comode, Tub/shower bench, SLP-  Equipment Details: PT- , OT-  Patient/family involved in discharge planning: PT- Patient,  OT-Patient, SLP-   MD ELOS: 10-14 days. Medical Rehab Prognosis:  Good Assessment: 78 year old female with history of COPD and lymphoma who was admitted on 04/10/2019 after losing her balance and sustaining a fall onto left hip with onset of pain.  X-rays done in ED revealing left transcervical proximal femur fracture and patient underwent left total hip arthroplasty with anterior approach by Dr. Lyla Glassing on 03/14/2019.  Postop to be weightbearing as tolerated and on aspirin for DVT prophylaxis.  She has had issues with hypoxia requiring supplemental oxygen as well as issues with hypotension.  Hospital course further complicated by acute blood loss anemia and hematology/oncology recommended  holding holding Revlimid.  She received a blood transfusion on 04/13/2019.  Therapy ongoing and patient is noted to have deficits in mobility and ADLs.  She is fatigued from sitting in the chair and  asks to return to the bed. Will set goals for Supervision with PT/OT.  Due to the current state of emergency, patients may not be receiving their 3-hours of Medicare-mandated therapy.  See Team Conference Notes for weekly updates to the plan of care

## 2019-04-15 NOTE — Evaluation (Signed)
Occupational Therapy Assessment and Plan  Patient Details  Name: Brooke Washington MRN: [id number] Date of Birth: November 17, 1940  OT Diagnosis: acute pain and muscle weakness (generalized) Rehab Potential: Rehab Potential (ACUTE ONLY): Good ELOS: 14-16 days   Today's Date: 04/15/2019 OT Individual Time: 5631-4970 OT Individual Time Calculation (min): 70 min     Problem List:  Patient Active Problem List   Diagnosis Date Noted  . S/p left hip fracture 04/15/2019  . Hyperglycemia   . Chronic obstructive pulmonary disease ([place])   . Acute blood loss anemia   . Drug induced constipation   . Status post total hip replacement, left   . Supplemental oxygen dependent   . Postoperative pain   . Closed left hip fracture, initial encounter ([place]) 04/10/2019  . Other fatigue 04/09/2019  . Leukopenia due to antineoplastic chemotherapy ([place]) 01/25/2019  . Bilateral arm pain 12/14/2018  . Pancytopenia, acquired ([place]) 06/20/2018  . Weight loss 06/20/2018  . Physical deconditioning 04/10/2018  . 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 Hypotension, unspecified 03/29/2018  . Chronic respiratory failure with hypoxia ([place]) 12/30/2017  . Other constipation 12/29/2017  . Neutropenic fever ([place]) 12/18/2017  . Thrombocytopenia ([place]) 12/18/2017  . Abdominal fullness 12/18/2017  . Acute urinary retention 12/18/2017  . Mucositis due to antineoplastic therapy 12/15/2017  . Heart burn 12/15/2017  . Frequent headaches 12/15/2017  . 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 Goals of care, counseling/discussion 11/29/2017  . Pathological fracture in neoplastic disease, right humerus, initial encounter for fracture 11/24/2017  . Hyperuricemia 11/22/2017  . Pathological fracture due to neoplastic disease, initial encounter 11/21/2017  . COPD GOLD II 07/05/2017  . COLONIC POLYPS, HYPERPLASTIC, HX OF 05/28/2010  . 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[facility]) 11/27/2007  . Depression  05/05/2007    Past Medical History:  Past Medical History:  Diagnosis Date  . Cancer ([place])  APRIL OF 2000   LOW-GRADE NON-HODGKIN'S LYMPHOMA  . COPD (chronic obstructive pulmonary disease) ([place])   . Depression   . Emphysema of lung ([employer])   . GERD (gastroesophageal reflux disease)   . Hyperlipidemia   . S/p left hip fracture 04/15/2019   Past Surgical History:  Past Surgical History:  Procedure Laterality Date  . 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 FRACTURE SURGERY    . IR FLUORO GUIDE PORT INSERTION RIGHT  12/07/2017  . IR US GUIDE VASC ACCESS RIGHT  12/07/2017  . ORIF HUMERUS FRACTURE Right 11/23/2017   Procedure: OPEN REDUCTION INTERNAL FIXATION (ORIF) HUMERAL SHAFT FRACTURE;  Surgeon: Shona Needles, MD;  Location: [facility];  Service: Orthopedics;  Laterality: Right;  . RIGHT OOPHORECTOMY  1976  . 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   Procedure: TOTAL HIP ARTHROPLASTY ANTERIOR APPROACH LEFT HIP;  Surgeon: Rod Can, MD;  Location: [facility];  Service: Orthopedics;  Laterality: Left;    Assessment & Plan Clinical Impression: Brooke Washington is a 78 year old female with history of COPD and lymphoma who was admitted on 04/10/2019 after losing her balance and sustaining a fall onto left hip with onset of pain.  History taken from chart review and patient.  X-rays done in ED revealing left transcervical proximal femur fracture and patient underwent left total hip arthroplasty with anterior approach by Dr. Lyla Glassing on 03/14/2019.  Postop to be weightbearing as tolerated and on aspirin for DVT prophylaxis.  She has had issues with  hypoxia requiring supplemental oxygen as well as issues with hypotension.  Hospital course further complicated by acute blood loss anemia and hematology/oncology recommended holding holding Revlimid.  She received a blood transfusion on 04/13/2019.  Therapy ongoing and patient is noted to have  deficits in mobility and ADLs.  She is fatigued from sitting in the chair and asks to return to the bed.  CIR recommended due to functional decline. Please see preadmission assessment from today as well.  Patient transferred to CIR on 04/14/2019 .    Patient currently requires mod with basic self-care skills secondary to muscle weakness, decreased cardiorespiratoy endurance and decreased standing balance, decreased postural control and decreased balance strategies.  Prior to hospitalization, patient could complete ADLs with modified independent .  Patient will benefit from skilled intervention to decrease level of assist with basic self-care skills and increase level of independence with iADL prior to discharge home with care partner.  Anticipate patient will require intermittent supervision and follow up home health.  OT - End of Session Activity Tolerance: Tolerates 10 - 20 min activity with multiple rests Endurance Deficit: Yes Endurance Deficit Description: generalized weakness OT Assessment Rehab Potential (ACUTE ONLY): Good OT Patient demonstrates impairments in the following area(s): Balance;Pain;Safety;Edema;Endurance;Motor OT Basic ADL's Functional Problem(s): Bathing;Dressing;Toileting OT Transfers Functional Problem(s): Toilet;Tub/Shower OT Additional Impairment(s): None OT Plan OT Intensity: Minimum of 1-2 x/day, 45 to 90 minutes OT Frequency: 5 out of 7 days OT Duration/Estimated Length of Stay: 14-16 days OT Treatment/Interventions: Balance/vestibular training;Discharge planning;Pain management;Self Care/advanced ADL retraining;Therapeutic Activities;Disease mangement/prevention;Functional mobility training;Patient/family education;Therapeutic Exercise;Community reintegration;DME/adaptive equipment instruction;Psychosocial support;UE/LE Strength taining/ROM;Wheelchair propulsion/positioning OT Self Feeding Anticipated Outcome(s): no goal set OT Basic Self-Care Anticipated  Outcome(s): (S) OT Toileting Anticipated Outcome(s): (S) OT Bathroom Transfers Anticipated Outcome(s): (S) OT Recommendation Patient destination: Home Follow Up Recommendations: Home health OT Equipment Recommended: 3 in 1 bedside comode;Tub/shower bench   Skilled Therapeutic Intervention Skilled OT evaluation completed. Pt edu on OT POC, ELOS, condition insight, use of DME, and follow up therapy. Pt completed ADLs as described below sitting at sink. Pt completed toileting tasks with min A, completing peri hygiene seated. Pt required mod A for all transfers this session. Cueing required for UE placement during transfers and for upright posture, with pt in kyphotic position anytime upright standing was initiated. RW management edu provided. Pt's SpO2 ranged from 88-90% on RA- RN confirmed that pt had COPD and this is an acceptable range. Pt returned to bed and was left supine with all needs met, bed alarm set.   OT Evaluation Precautions/Restrictions  Precautions Precautions: Fall Precaution Comments: watch O2 Restrictions Weight Bearing Restrictions: Yes LLE Weight Bearing: Weight bearing as tolerated General Chart Reviewed: Yes Family/Caregiver Present: No Vital Signs Oxygen Therapy SpO2: 91 % O2 Device: Room Air Pain Pain Assessment Pain Scale: 0-10 Pain Score: 0-No pain Home Living/Prior Functioning Home Living Family/patient expects to be discharged to:: Private residence Living Arrangements: Spouse/significant other Available Help at Discharge: Family, Available 24 hours/day Type of Home: House Home Access: Level entry, Stairs to enter Entrance Stairs-Number of Steps: 1 Home Layout: Two level, Able to live on main level with bedroom/bathroom Bathroom Shower/Tub: Tub/shower unit, Walk-in shower Bathroom Toilet: Handicapped height Bathroom Accessibility: Yes Additional Comments: Pt's daughter works for [employer]  Lives With: Spouse IADL History Homemaking  Responsibilities: Yes Meal Prep Responsibility: Secondary Laundry Responsibility: Secondary Cleaning Responsibility: Secondary Bill Paying/Finance Responsibility: Secondary Occupation: Retired Prior Function Level of Independence: Independent with basic ADLs, Independent with gait, Independent with   homemaking with ambulation Driving: No Vocation: Retired Comments: Reports used to work out regularly with husband at [employer] prior to [employer] ADL ADL Eating: Independent Where Assessed-Eating: Chair Grooming: Supervision/safety Where Assessed-Grooming: Sitting at sink Upper Body Bathing: Supervision/safety Where Assessed-Upper Body Bathing: Sitting at sink Lower Body Bathing: Moderate assistance, Moderate cueing Where Assessed-Lower Body Bathing: Sitting at sink, Standing at sink Upper Body Dressing: Modified independent (Device) Where Assessed-Upper Body Dressing: Sitting at sink Lower Body Dressing: Moderate cueing, Moderate assistance Where Assessed-Lower Body Dressing: Sitting at sink, Standing at sink Toileting: Moderate assistance, Minimal cueing Where Assessed-Toileting: Bedside Commode, Toilet Toilet Transfer: Moderate assistance Toilet Transfer Method: Stand pivot Toilet Transfer Equipment: Bedside commode Vision Baseline Vision/History: Wears glasses Wears Glasses: At all times Patient Visual Report: Blurring of vision(visual changes 2/2 chemo) Vision Assessment?: No apparent visual deficits Perception  Perception: Within Functional Limits Praxis Praxis: Intact Cognition Overall Cognitive Status: Within Functional Limits for tasks assessed Arousal/Alertness: Awake/alert Orientation Level: Person;Place;Situation Person: Oriented Place: Oriented Situation: Oriented Year: 2020 Month: July Day of Week: Correct Memory: Appears intact Immediate Memory Recall: Sock;Blue;Bed Memory Recall Sock: With Cue Memory Recall Blue: Without Cue Memory Recall Bed: Without  Cue Attention: Sustained Sustained Attention: Appears intact Awareness: Appears intact Problem Solving: Appears intact Safety/Judgment: Appears intact Sensation Sensation Light Touch: Impaired Detail Central sensation comments: Reports some neuropathy in her toes but reports no acute changes s/p sx Hot/Cold: Appears Intact Coordination Gross Motor Movements are Fluid and Coordinated: No Fine Motor Movements are Fluid and Coordinated: Yes Coordination and Movement Description: guarding 2/2 pain and generalized weakness Motor  Motor Motor: Other (comment) Motor - Skilled Clinical Observations: Generalized weakness Mobility  Bed Mobility Bed Mobility: Supine to Sit Supine to Sit: Minimal Assistance - Patient > 75% Transfers Sit to Stand: Moderate Assistance - Patient 50-74% Stand to Sit: Moderate Assistance - Patient 50-74%  Trunk/Postural Assessment  Cervical Assessment Cervical Assessment: Exceptions to WFL(forward head) Thoracic Assessment Thoracic Assessment: Exceptions to WFL(rounded shoulders, kyphotic posture) Lumbar Assessment Lumbar Assessment: Exceptions to WFL(posterior pelvic tilt) Postural Control Postural Control: Deficits on evaluation Righting Reactions: delayed  Balance Balance Balance Assessed: Yes Static Sitting Balance Static Sitting - Balance Support: No upper extremity supported Static Sitting - Level of Assistance: 6: Modified independent (Device/Increase time) Dynamic Sitting Balance Dynamic Sitting - Balance Support: Feet supported;During functional activity Dynamic Sitting - Level of Assistance: 5: Stand by assistance Sitting balance - Comments: During LB dressing Static Standing Balance Static Standing - Balance Support: During functional activity;Bilateral upper extremity supported Static Standing - Level of Assistance: 4: Min assist Dynamic Standing Balance Dynamic Standing - Balance Support: During functional activity;Bilateral upper  extremity supported Dynamic Standing - Level of Assistance: 3: Mod assist Extremity/Trunk Assessment RUE Assessment RUE Assessment: Within Functional Limits General Strength Comments: Overall functional, deferred MMT 2/2 reported bone involvement from cx LUE Assessment LUE Assessment: Within Functional Limits General Strength Comments: Overall functional, deferred MMT 2/2 reported bone involvement from cx     Refer to Care Plan for Long Term Goals  Recommendations for other services: None    Discharge Criteria: Patient will be discharged from OT if patient refuses treatment 3 consecutive times without medical reason, if treatment goals not met, if there is a change in medical status, if patient makes no progress towards goals or if patient is discharged from hospital.  The above assessment, treatment plan, treatment alternatives and goals were discussed and mutually agreed upon: by patient  Curtis Sites 04/15/2019, 12:39 PM

## 2019-04-16 ENCOUNTER — Inpatient Hospital Stay (HOSPITAL_COMMUNITY): Payer: Medicare Other

## 2019-04-16 DIAGNOSIS — R0989 Other specified symptoms and signs involving the circulatory and respiratory systems: Secondary | ICD-10-CM

## 2019-04-16 DIAGNOSIS — K5903 Drug induced constipation: Secondary | ICD-10-CM

## 2019-04-16 LAB — CBC WITH DIFFERENTIAL/PLATELET
Abs Immature Granulocytes: 0.03 10*3/uL (ref 0.00–0.07)
Basophils Absolute: 0 10*3/uL (ref 0.0–0.1)
Basophils Relative: 1 %
Eosinophils Absolute: 0.2 10*3/uL (ref 0.0–0.5)
Eosinophils Relative: 4 %
HCT: 31.9 % — ABNORMAL LOW (ref 36.0–46.0)
Hemoglobin: 10.8 g/dL — ABNORMAL LOW (ref 12.0–15.0)
Immature Granulocytes: 1 %
Lymphocytes Relative: 13 %
Lymphs Abs: 0.7 10*3/uL (ref 0.7–4.0)
MCH: 32.4 pg (ref 26.0–34.0)
MCHC: 33.9 g/dL (ref 30.0–36.0)
MCV: 95.8 fL (ref 80.0–100.0)
Monocytes Absolute: 0.8 10*3/uL (ref 0.1–1.0)
Monocytes Relative: 15 %
Neutro Abs: 3.4 10*3/uL (ref 1.7–7.7)
Neutrophils Relative %: 66 %
Platelets: 197 10*3/uL (ref 150–400)
RBC: 3.33 MIL/uL — ABNORMAL LOW (ref 3.87–5.11)
RDW: 15.1 % (ref 11.5–15.5)
WBC: 5.1 10*3/uL (ref 4.0–10.5)
nRBC: 0 % (ref 0.0–0.2)

## 2019-04-16 LAB — COMPREHENSIVE METABOLIC PANEL
ALT: 19 U/L (ref 0–44)
AST: 28 U/L (ref 15–41)
Albumin: 2.9 g/dL — ABNORMAL LOW (ref 3.5–5.0)
Alkaline Phosphatase: 87 U/L (ref 38–126)
Anion gap: 9 (ref 5–15)
BUN: 10 mg/dL (ref 8–23)
CO2: 29 mmol/L (ref 22–32)
Calcium: 8.5 mg/dL — ABNORMAL LOW (ref 8.9–10.3)
Chloride: 100 mmol/L (ref 98–111)
Creatinine, Ser: 0.72 mg/dL (ref 0.44–1.00)
GFR calc Af Amer: >60 mL/min (ref >60)
GFR calc non Af Amer: >60 mL/min (ref >60)
Glucose, Bld: 117 mg/dL — ABNORMAL HIGH (ref 70–99)
Potassium: 2.5 mmol/L — CL (ref 3.5–5.1)
Sodium: 138 mmol/L (ref 135–145)
Total Bilirubin: 1.3 mg/dL — ABNORMAL HIGH (ref 0.3–1.2)
Total Protein: 4.9 g/dL — ABNORMAL LOW (ref 6.5–8.1)

## 2019-04-16 LAB — MAGNESIUM: Magnesium: 1.7 mg/dL (ref 1.7–2.4)

## 2019-04-16 MED ORDER — POTASSIUM CHLORIDE CRYS ER 20 MEQ PO TBCR
40.0000 meq | EXTENDED_RELEASE_TABLET | Freq: Once | ORAL | Status: AC
  Administered 2019-04-16: 14:00:00 40 meq via ORAL
  Filled 2019-04-16: qty 2

## 2019-04-16 MED ORDER — POTASSIUM CHLORIDE 10 MEQ/100ML IV SOLN
10.0000 meq | INTRAVENOUS | Status: AC
  Administered 2019-04-16 (×5): 10 meq via INTRAVENOUS
  Filled 2019-04-16 (×5): qty 100

## 2019-04-16 MED ORDER — POTASSIUM CHLORIDE CRYS ER 20 MEQ PO TBCR
40.0000 meq | EXTENDED_RELEASE_TABLET | Freq: Once | ORAL | Status: AC
  Administered 2019-04-16: 17:00:00 40 meq via ORAL
  Filled 2019-04-16: qty 2

## 2019-04-16 MED ORDER — POTASSIUM CHLORIDE 10 MEQ/100ML IV SOLN
10.0000 meq | INTRAVENOUS | Status: DC
  Filled 2019-04-16 (×5): qty 100

## 2019-04-16 MED ORDER — METHOCARBAMOL 500 MG PO TABS
500.0000 mg | ORAL_TABLET | Freq: Four times a day (QID) | ORAL | Status: DC | PRN
  Administered 2019-04-18 – 2019-04-24 (×8): 500 mg via ORAL
  Filled 2019-04-16 (×9): qty 1

## 2019-04-16 MED ORDER — PRO-STAT SUGAR FREE PO LIQD
30.0000 mL | Freq: Two times a day (BID) | ORAL | Status: DC
  Administered 2019-04-17 – 2019-04-24 (×13): 30 mL via ORAL
  Filled 2019-04-16 (×17): qty 30

## 2019-04-16 MED ORDER — ALUM & MAG HYDROXIDE-SIMETH 200-200-20 MG/5ML PO SUSP: 30.0000 mL | ORAL | Status: DC | PRN

## 2019-04-16 MED ORDER — TRAMADOL HCL 50 MG PO TABS: 50.0000 mg | ORAL_TABLET | Freq: Four times a day (QID) | ORAL | Status: DC | PRN

## 2019-04-16 MED ORDER — DIPHENOXYLATE-ATROPINE 2.5-0.025 MG PO TABS
2.0000 | ORAL_TABLET | Freq: Four times a day (QID) | ORAL | Status: DC | PRN
  Administered 2019-04-16 – 2019-04-23 (×3): 2 via ORAL
  Filled 2019-04-16 (×4): qty 2

## 2019-04-16 NOTE — Progress Notes (Signed)
[facility] PHYSICAL MEDICINE & REHABILITATION PROGRESS NOTE  Subjective/Complaints: Patient seen sitting up in bed this morning.  She states she slept well overnight.  She denies complaints.  ROS: Denies CP, shortness of breath, nausea, vomiting, diarrhea.  Objective: Vital Signs: Blood pressure (!) 142/76, pulse 86, temperature 98.3 F (36.8 C), resp. rate 17, height 5\' 4"  (1.626 m), weight 57.8 kg, SpO2 94 %. No results found. Recent Labs    04/13/19 2055  HGB 10.7*  HCT 31.0*   No results for input(s): NA, K, CL, CO2, GLUCOSE, BUN, CREATININE, CALCIUM in the last 72 hours.  Physical Exam: BP (!) 142/76 (BP Location: Left Arm)   Pulse 86   Temp 98.3 F (36.8 C)   Resp 17   Ht 5\' 4"  (1.626 m)   Wt 57.8 kg   LMP  (LMP Unknown)   SpO2 94%   BMI 21.87 kg/m  Constitutional: No distress . Vital signs reviewed. HENT: Normocephalic.  Atraumatic. Eyes: EOMI.  No discharge. Cardiovascular: No JVD. Respiratory: Normal effort. GI: Non-distended. Musc: Left hip with edema and tenderness, improving  Neurological: Alert and oriented Motor: Bilateral upper extremities: 5/5 proximal to distal, unchanged Right lower extremity: Hip flexion 4/5, knee extension 4+/5, ankle dorsiflexion 5/5 Left lower extremity: Hip flexion 4-/5, knee extension 4/5, and dorsiflexion 5/5, stable Skin:  Incision with dressing C/D/I Psychiatric: She has a normal mood and affect. Her behavior is normal.   Assessment/Plan: 1. Functional deficits secondary to left hip fracture status post left THA  which require 3+ hours per day of interdisciplinary therapy in a comprehensive inpatient rehab setting.  Physiatrist is providing close team supervision and 24 hour management of active medical problems listed below.  Physiatrist and rehab team continue to assess barriers to discharge/monitor patient progress toward functional and medical goals  Care Tool:  Bathing    Body parts bathed by patient: Right  arm, Left arm, Chest, Abdomen, Front perineal area, Buttocks, Right upper leg, Left upper leg, Right lower leg, Left lower leg, Face         Bathing assist Assist Level: Minimal Assistance - Patient > 75%     Upper Body Dressing/Undressing Upper body dressing   What is the patient wearing?: Pull over shirt    Upper body assist Assist Level: Supervision/Verbal cueing    Lower Body Dressing/Undressing Lower body dressing      What is the patient wearing?: Underwear/pull up     Lower body assist Assist for lower body dressing: Moderate Assistance - Patient 50 - 74%     Toileting Toileting    Toileting assist Assist for toileting: Moderate Assistance - Patient 50 - 74%     Transfers Chair/bed transfer  Transfers assist     Chair/bed transfer assist level: Moderate Assistance - Patient 50 - 74%     Locomotion Ambulation   Ambulation assist              Walk 10 feet activity   Assist           Walk 50 feet activity   Assist           Walk 150 feet activity   Assist           Walk 10 feet on uneven surface  activity   Assist           Wheelchair     Assist               Wheelchair 50 feet  with 2 turns activity    Assist            Wheelchair 150 feet activity     Assist            Medical Problem List and Plan: 1.  Deficits with mobility, transfers, self-care secondary to left hip fracture status post left THA on 04/10/2019.  Continue CIR 2.  Antithrombotics: -DVT/anticoagulation:  Pharmaceutical: Lovenox             -antiplatelet therapy: ASA for DVT prophylaxis after discharge 3. Pain Management: Hydrocodone as needed 4. Mood: LCSW to follow for evaluation and support             -antipsychotic agents: N/A 5. Neuropsych: This patient is capable of making decisions on her own behalf.  Celexa 20 daily 6. Skin/Wound Care: Monitor wound for healing.  Nutritional supplements added. 7.  Fluids/Electrolytes/Nutrition: Monitor I/O.  BMP pending. 8. Hypoxic respiratory failure: COPD per films--encourage IS.    As needed inhaler ordered  ?  Weaned supplemental oxygen as tolerated. 9. ABLA: Status post transfusion on 04/13/2019.  Continue iron supplement.    Hemoglobin 10.7 on 7/3 after 2 units PRBC on 7/30  CBC pending 10.  Blood pressure:   Labile on 7/6  Monitor with increased mobility 11. H/o lymphoma: To hold Revlimid for now.  Follow-up with heme-onc for resumption after discharge 12. Constipation: Continue bowel regimen.  Improving 13.  Hyperglycemia  ?  Trending up on 7/4  Continue to monitor  LOS: 2 days A FACE TO FACE EVALUATION WAS PERFORMED  Zeba Luby Lorie Phenix 04/16/2019, 11:19 AM

## 2019-04-16 NOTE — Progress Notes (Signed)
[facility] contacted by lab and instructed to draw Mg and BMP at 1900. Upon arrival to unit, spoke with pt's nurse who stated she is getting 5 runs of Potassium which will not be finished until approximately 2220 tonight. Also noted Mg order was an add on for specimen collected earlier today. Called Danella Sensing, NP who is covering tonight and spoke with her regarding labs. She confirmed potassium level should be drawn at least 30 mins after last run of potassium administered. Verbal order written to change potassium level to 2300 tonight.

## 2019-04-16 NOTE — Care Management (Signed)
Inpatient Rehabilitation Center Individual Statement of Services  Patient Name:  Brooke Washington  Date:  04/16/2019  Welcome to the [facility].  Our goal is to provide you with an individualized program based on your diagnosis and situation, designed to meet your specific needs.  With this comprehensive rehabilitation program, you will be expected to participate in at least 3 hours of rehabilitation therapies Monday-Friday, with modified therapy programming on the weekends.  Your rehabilitation program will include the following services:  Physical Therapy (PT), Occupational Therapy (OT), 24 hour per day rehabilitation nursing, Therapeutic Recreaction (TR), Case Management (Social Worker), Rehabilitation Medicine, Nutrition Services and Pharmacy Services  Weekly team conferences will be held on Tuesdays to discuss your progress.  Your Social Worker will talk with you frequently to get your input and to update you on team discussions.  Team conferences with you and your family in attendance may also be held.  Expected length of stay: 12-14 days   Overall anticipated outcome: supervision  Depending on your progress and recovery, your program may change. Your Social Worker will coordinate services and will keep you informed of any changes. Your Social Worker's name and contact numbers are listed  below.  The following services may also be recommended but are not provided by the [facility] will be made to provide these services after discharge if needed.  Arrangements include referral to agencies that provide these services.  Your insurance has been verified to be:  Medicare; [place] Your primary doctor is:  Dietitian  Pertinent information will be shared with your doctor and your insurance company.  Social Worker:  [place], [place] or ([phone]   Information discussed with and copy given to patient by: Lennart Pall, 04/16/2019, 4:41 PM

## 2019-04-16 NOTE — Progress Notes (Signed)
Social Work Assessment and Plan   Patient Details  Name: Brooke Washington MRN: [id number] Date of Birth: 1940-12-15  Today's Date: 04/16/2019  Problem List:  Patient Active Problem List   Diagnosis Date Noted  . Labile blood pressure   . S/p left hip fracture 04/15/2019  . Hyperglycemia   . Chronic obstructive pulmonary disease ([place])   . Acute blood loss anemia   . Drug induced constipation   . Status post total hip replacement, left   . Supplemental oxygen dependent   . Postoperative pain   . Closed left hip fracture, initial encounter ([place]) 04/10/2019  . Other fatigue 04/09/2019  . Leukopenia due to antineoplastic chemotherapy ([place]) 01/25/2019  . Bilateral arm pain 12/14/2018  . Pancytopenia, acquired ([place]) 06/20/2018  . Weight loss 06/20/2018  . Physical deconditioning 04/10/2018  . 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 Hypotension, unspecified 03/29/2018  . Chronic respiratory failure with hypoxia ([place]) 12/30/2017  . Other constipation 12/29/2017  . Neutropenic fever ([place]) 12/18/2017  . Thrombocytopenia ([place]) 12/18/2017  . Abdominal fullness 12/18/2017  . Acute urinary retention 12/18/2017  . Mucositis due to antineoplastic therapy 12/15/2017  . Heart burn 12/15/2017  . Frequent headaches 12/15/2017  . 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 Goals of care, counseling/discussion 11/29/2017  . Pathological fracture in neoplastic disease, right humerus, initial encounter for fracture 11/24/2017  . Hyperuricemia 11/22/2017  . Pathological fracture due to neoplastic disease, initial encounter 11/21/2017  . COPD GOLD II 07/05/2017  . COLONIC POLYPS, HYPERPLASTIC, HX OF 05/28/2010  . 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 Depression 05/05/2007   Past Medical History:  Past Medical History:  Diagnosis Date  . Cancer ([place])  APRIL OF 2000   LOW-GRADE NON-HODGKIN'S LYMPHOMA  . COPD (chronic obstructive pulmonary  disease) ([place])   . Depression   . Emphysema of lung ([place] [employer])   . GERD (gastroesophageal reflux disease)   . Hyperlipidemia   . S/p left hip fracture 04/15/2019   Past Surgical History:  Past Surgical History:  Procedure Laterality Date  . 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 FRACTURE SURGERY    . IR FLUORO GUIDE PORT INSERTION RIGHT  12/07/2017  . IR US GUIDE VASC ACCESS RIGHT  12/07/2017  . ORIF HUMERUS FRACTURE Right 11/23/2017   Procedure: OPEN REDUCTION INTERNAL FIXATION (ORIF) HUMERAL SHAFT FRACTURE;  Surgeon: Shona Needles, MD;  Location: [facility];  Service: Orthopedics;  Laterality: Right;  . RIGHT OOPHORECTOMY  1976  . 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   Procedure: TOTAL HIP ARTHROPLASTY ANTERIOR APPROACH LEFT HIP;  Surgeon: Rod Can, MD;  Location: [facility];  Service: Orthopedics;  Laterality: Left;   Social History:  reports that she quit smoking about 4 years ago. Her smoking use included cigarettes. She has a 12.75 pack-year smoking history. She has never used smokeless tobacco. She reports current alcohol use. She reports that she does not use drugs.  Family / Support Systems Marital Status: Married How Long?: 42 yrs Patient Roles: Spouse, Parent, Other (Comment)(grandparent) Spouse/Significant Other: Jazsmin Couse @ [phone] Children: one daughter living locally Other Supports: 54 y.o granddaughter living with pt Anticipated Caregiver: spouse and grand daughter Ability/Limitations of Caregiver: no limitations Caregiver Availability: 24/7 Family Dynamics: Pt enjoys speaking about her spouse and family and states, "I'm spoiled".  Notes her husband "is the best caregiver."  Social History Preferred language: [place]  Religion: None Cultural Background: NA Read: Yes Write: Yes Employment Status: Retired Public relations account executive Issues: None Guardian/Conservator: None - per MD, pt is  capable of making decisions on her own behalf.   Abuse/Neglect Abuse/Neglect Assessment Can Be Completed: Yes Physical Abuse: Denies Verbal Abuse: Denies Sexual Abuse: Denies Exploitation of patient/patient's resources: Denies Self-Neglect: Denies  Emotional Status Pt's affect, behavior and adjustment status: Pt lying in bed and reports fatigue from full day of therapies.  She is pleasant and optimistic about her recovery.  She denies any significant emotional distress - will monitor. Recent Psychosocial Issues: None Psychiatric History: None Substance Abuse History: None  Patient / Family Perceptions, Expectations & Goals Pt/Family understanding of illness & functional limitations: Pt and family with good understanding of her fx and surgery performed. Premorbid pt/family roles/activities: Pt was completely independent and active at home and community. Anticipated changes in roles/activities/participation: Spouse to resume role as caregiver. Pt/family expectations/goals: "I hope I'm a lot stronger than this."  [employer]: None Premorbid Home Care/DME Agencies: Other (Comment)(Has had O[facility] at [facility]) Transportation available at discharge: yes  Discharge Planning Living Arrangements: Spouse/significant other, Other relatives Support Systems: Spouse/significant other, Children, Other relatives Type of Residence: Private residence Insurance Resources: [employer], Multimedia programmer (specify) Financial Resources: [place] Referred: No Living Expenses: Own Money Management: Spouse Does the patient have any problems obtaining your medications?: No Home Management: pt and family share responsibilities Patient/Family Preliminary Plans: Pt to return home with spouse able to provide 24/7 assistance Social Work Anticipated Follow Up Needs: HH/OP Expected length of stay: 14-16 days  Clinical Impression Very pleasant,  talkative woman here following a fall/ fx at home and now s/p THR.  Very good support available from her spouse.  Pt denies any emotional distress (other than "frustration with the fall").  Will follow for support and d/c planning needs.  Quantel Mcinturff 04/16/2019, 4:37 PM

## 2019-04-16 NOTE — Progress Notes (Addendum)
Physical Therapy Session Note  Patient Details  Name: Brooke Washington MRN: [id number] Date of Birth: 02-06-1941  Today's Date: 04/16/2019 PT Individual Time: 0900-1005 and 1250-1320 PT Individual Time Calculation (min): 65 min and 30 min  Short Term Goals: Week 1:  PT Short Term Goal 1 (Week 1): Patient to perform bed mobility min A. PT Short Term Goal 2 (Week 1): Patient to demonstrate standing balance with 1 UE support with S for functional task. PT Short Term Goal 3 (Week 1): Patient to ambulate 81' with min A with RW. PT Short Term Goal 4 (Week 1): Patient to negotiate 1 step with RW and CGA.  Skilled Therapeutic Interventions/Progress Updates:     Session 1: Patient in bed upon PT arrival. Patient alert and agreeable to PT session.  Therapeutic Activity: Bed Mobility: Patient performed supine to sit with min A for L LE managment. Provided verbal cues for sliding heels off the bed first before sitting up. Sitting EOB patient doffed her gown and doned a bra and shirt with supervision. She donned panties and pants with mod A for threading LEs and for pulling up both in standing and CGA for balance.  Transfers: Patient performed sit to/from stand x1 from the [facility] and stand pivot x1 from the bed to the chair with min A using RW. Provided verbal cues for hand placement and use of RW. Patient voided and had a BM on the BSC, required mod A for peri-care and LB dressing.   Gait Training:  Patient ambulated 20 feet using RW with CGA. Ambulated with step-to and intermittent step-through gait pattern leading with the L, decreased stance time on L, decreased DF L>R, and forefoot/mid foot at initial contact bilaterally. Provided verbal cues for erect posture, looking ahead, increased step length.  Therapeutic Exercise: Patient performed the following exercises with verbal and tactile cues for proper technique. -B ankle pumps x20 -L quad sets x10 -L heel slides with AAROM x10 -L SLR with AAROM  x5 -L hip abduction in supine with AAROM x10 -B glut sets x10  Patient in recliner at end of session with breaks locked, seat belt alarm set, and all needs within reach. Patient reported nausea at end of session. Denied medication, PT provided [place] and [place] crackers at end of session, RN aware.   Session 2: Patient in recliner upon PT arrival. Patient alert and agreeable to PT session.  Therapeutic Activity: Bed Mobility: Patient performed sit to supine with supervision. Provided verbal cues for using her R LE to lift her L LE. Transfers: Patient performed sit to/from stand x4 and a toilet  Transfer x1 with CGA using the RW. Provided verbal cues for pushing up and reaching back with R UE to off weight L LE. Required total A for peri-care and mod A for LB dressing.  Gait Training:  Patient ambulated 96 feet and 15 feet x2 using RW with CGA for safety. Ambulated with decreased gait speed, initially step-to gait pattern leading with L then progressed to step through gait pattern with cues,decreased step length on R, decreased stance time on L, increased hip and knee flexion, forward trunk lean, and downward head gaze. Provided verbal cues for looking ahead, increased step length, step-through gait pattern, and erect posture (strong forward lean).  Patient in bed at end of session with breaks locked, bed alarm set, and all needs within reach.    Therapy Documentation Precautions:  Precautions Precautions: Fall Precaution Comments: watch O2 Restrictions Weight Bearing Restrictions: Yes LLE  Weight Bearing: Weight bearing as tolerated Pain: Session 1: Pain Assessment at beginning of session. Pain Scale: 0-10 Pain Score: 0-No pain During session patient reported 5-6/10 L hip pain with mobility and requested Tylenol, which RN provided during session. PT provided repositioning and distraction throughout session as pain interventions.  Session 2: Pain Scale: 0-10 Pain Score: 0-No  pain  Therapy/Group: Individual Therapy  Araseli Sherry L Mikaelyn Arthurs PT, DPT  04/16/2019, 12:17 PM

## 2019-04-16 NOTE — Progress Notes (Signed)
VAST RN called main lab and advised that order written was for magnesium level to be added on to specimen drawn earlier today. Lab verbalized it would be done.

## 2019-04-16 NOTE — Progress Notes (Signed)
Pt refused I & O cath. Stated that she feels like she has to use the restroom soon and will try again after breakfast. Overall no other complaints

## 2019-04-16 NOTE — Progress Notes (Signed)
Initial Nutrition Assessment  DOCUMENTATION CODES:   Not applicable  INTERVENTION:  Ensure Enlive po BID, each supplement provides 350 kcal and 20 grams of protein (vanillla) Magic cup TID with meals, each supplement provides 290 kcal and 9 grams of protein (vanilla or orange cream) Prostat po BID, each supplement provides 100 kcal and 15 grams of protein  Diet liberalization to regular pending provider MVI   NUTRITION DIAGNOSIS:   Increased nutrient needs related to post-op healing as evidenced by estimated needs.  GOAL:   Patient will meet greater than or equal to 90% of their needs   MONITOR:   PO intake, Labs, Diet advancement, Weight trends, Supplement acceptance  REASON FOR ASSESSMENT:   Malnutrition Screening Tool    ASSESSMENT:  78 year old female with history of lymphoma on Revlimid, COPD who presented to ED after a mechanical fall on 6/29. X-ray revealed left transcervical femur fracture and underwent left total hip arthroplasty on 6/30.  Per chart review, pt had issues with hypoxia requiring supplemental O2 and hospital course further complicated by acute blood loss anemia. Revlimid on hold per oncology  7/3: blood transfusion 7/4: d/c from 4N and transferred to 4W for CIR  Patient sitting in chair, lunch tray had just arrived at visit. Patient is having mixed veg, mashed pots, and [place] w/ gravy. She reports PO would be better if the food tasted better. Pt enjoying her lunch this afternoon, but stated breakfast was terrible.   Pt currently on CHO diet; no history of DM and well controlled glucose throughout admission. Possible diet liberalization would encourage better meal intake.  At home, pt reports 2 good meals/day and states that her husband is a Wellsite geologist. She recalls chili, chx soup, and lots of chx prepared in different ways.   Pt eating 63% of last 4 recorded meals  Patient denies any changes in wt and recalls UBW of 125lb  Medications  reviewed and include: VitD, Miralax, Potassium chloride 11mEq tablet,  Labs: Potassium 2.5 (L) - addressing   NUTRITION - FOCUSED PHYSICAL EXAM:  Deferred at this time: pt sitting in bedside chair and had just began to eat lunch  Diet Order:   Diet Order            Diet Carb Modified Fluid consistency: Thin; Room service appropriate? Yes  Diet effective now              EDUCATION NEEDS:   No education needs have been identified at this time  Skin:  Skin Assessment: Reviewed RN Assessment(incision; closed: left hip)  Last BM:  7/6; type 6  Height:   Ht Readings from Last 1 Encounters:  04/14/19 5\' 4"  (1.626 m)    Weight:   Wt Readings from Last 1 Encounters:  04/14/19 57.8 kg    Ideal Body Weight:  54.5 kg  BMI:  Body mass index is 21.87 kg/m.  Estimated Nutritional Needs:   Kcal:  1450-1600  Protein:  86-100g  Fluid:  1.6L   Lajuan Lines, RD, LDN  After Hours/Weekend Pager: [phone]

## 2019-04-16 NOTE — Progress Notes (Signed)
Inpatient Rehabilitation  Patient information reviewed and entered into eRehab system by Madelyn Tlatelpa M. Esme Durkin, M.A., CCC/SLP, PPS Coordinator.  Information including medical coding, functional ability and quality indicators will be reviewed and updated through discharge.    

## 2019-04-16 NOTE — Progress Notes (Addendum)
Occupational Therapy Session Note  Patient Details  Name: Brooke Washington MRN: [id number] Date of Birth: 08/08/1941  Today's Date: 04/16/2019 OT Individual Time: 1030-1130 OT Individual Time Calculation (min): 60 min   Session 2: OT Individual Time: 1430-1505 OT Individual Time Calculation (min):  35 min    Short Term Goals: Week 1:  OT Short Term Goal 1 (Week 1): Pt will thread BLE through pants with CGA using LRAD OT Short Term Goal 2 (Week 1): Pt will transfer to [facility] over toilet with min A OT Short Term Goal 3 (Week 1): Pt will complete tub transfer with TTB with min A  Skilled Therapeutic Interventions/Progress Updates:   Pt received sitting up in recliner, reporting pain in L hip as 6/10 during mobility. Rest breaks required for pain relief, no further intervention required. Pt completed ambulatory transfer to w/c with mod A to power up from chair and then min A during mobility. Pt was transported to dynavision room. She completed 2x 2 minutes of standing functional reaching. Upper quadrants activated to encourage pelvis to rotate anteriorly and promote upright posture. Pt was then transported to ADL apartment where she and OT discussed potential pieces of AE to use in the shower. Pt decided on TTB as safest option. Pt returned demonstration with transfer, requiring min A to lift LLE over side of tub. Pt able to stand from elevated tub bench with only min A.  Discussed safety awareness and fall risk reduction in the bathroom. Pt then completed 10 min on NuStep, with a focus on L hip mobility, moving pt closer to petal in order to increase L hip flexion tolerated. Pt enjoyed activity and reported pain relief and increased AROM. Pt returned to room and transferred back to recliner. Safety belt fastened and all needs met.   Session 2: Pt received supine with c/o pain in L hip described as stiffness, requesting increased mobility for pain relief. Pt transitioned to EOB with (S). Pt completed  ambulatory transfer from EOB into bathroom with CGA, mod A to power up from EOB. Pt demonstrated improved RW management. Pt voided urine/bowel and completed peri hygiene with set up. Increased time required for hygiene 2/2 pt having multiple BM's on toilet. Pt completed ambulatory transfer back to bed, as tech present to complete EKG. Pt returned to supine with min A for LLE management.     Therapy Documentation Precautions:  Precautions Precautions: Fall Precaution Comments: watch O2 Restrictions Weight Bearing Restrictions: Yes LLE Weight Bearing: Weight bearing as tolerated   Therapy/Group: Individual Therapy  Curtis Sites 04/16/2019, 7:12 AM

## 2019-04-17 ENCOUNTER — Inpatient Hospital Stay (HOSPITAL_COMMUNITY): Payer: Medicare Other | Admitting: Rehabilitation

## 2019-04-17 ENCOUNTER — Inpatient Hospital Stay (HOSPITAL_COMMUNITY): Payer: Medicare Other

## 2019-04-17 DIAGNOSIS — R9431 Abnormal electrocardiogram [ECG] [EKG]: Secondary | ICD-10-CM

## 2019-04-17 DIAGNOSIS — E876 Hypokalemia: Secondary | ICD-10-CM

## 2019-04-17 LAB — BASIC METABOLIC PANEL
Anion gap: 9 (ref 5–15)
Anion gap: 9 (ref 5–15)
BUN: 11 mg/dL (ref 8–23)
BUN: 9 mg/dL (ref 8–23)
CO2: 25 mmol/L (ref 22–32)
CO2: 26 mmol/L (ref 22–32)
Calcium: 8.4 mg/dL — ABNORMAL LOW (ref 8.9–10.3)
Calcium: 8.9 mg/dL (ref 8.9–10.3)
Chloride: 103 mmol/L (ref 98–111)
Chloride: 104 mmol/L (ref 98–111)
Creatinine, Ser: 0.66 mg/dL (ref 0.44–1.00)
Creatinine, Ser: 0.88 mg/dL (ref 0.44–1.00)
GFR calc Af Amer: >60 mL/min (ref >60)
GFR calc Af Amer: >60 mL/min (ref >60)
GFR calc non Af Amer: >60 mL/min (ref >60)
GFR calc non Af Amer: >60 mL/min (ref >60)
Glucose, Bld: 102 mg/dL — ABNORMAL HIGH (ref 70–99)
Glucose, Bld: 96 mg/dL (ref 70–99)
Potassium: 4.1 mmol/L (ref 3.5–5.1)
Potassium: 3.5 mmol/L (ref 3.5–5.1)
Sodium: 137 mmol/L (ref 135–145)
Sodium: 139 mmol/L (ref 135–145)

## 2019-04-17 LAB — POTASSIUM: Potassium: 3.7 mmol/L (ref 3.5–5.1)

## 2019-04-17 MED ORDER — SODIUM CHLORIDE 0.9% FLUSH
10.0000 mL | INTRAVENOUS | Status: DC | PRN
  Administered 2019-04-25: 09:00:00 10 mL
  Filled 2019-04-17: qty 40

## 2019-04-17 MED ORDER — SODIUM CHLORIDE 0.9% FLUSH
10.0000 mL | Freq: Two times a day (BID) | INTRAVENOUS | Status: DC
  Administered 2019-04-19 – 2019-04-24 (×2): 10 mL

## 2019-04-17 MED ORDER — POTASSIUM CHLORIDE CRYS ER 20 MEQ PO TBCR
20.0000 meq | EXTENDED_RELEASE_TABLET | Freq: Every day | ORAL | Status: DC
  Administered 2019-04-17 – 2019-04-18 (×2): 20 meq via ORAL
  Filled 2019-04-17 (×2): qty 1

## 2019-04-17 MED ORDER — MAGNESIUM SULFATE 2 GM/50ML IV SOLN
2.0000 g | Freq: Once | INTRAVENOUS | Status: AC
  Administered 2019-04-17: 13:00:00 2 g via INTRAVENOUS
  Filled 2019-04-17: qty 50

## 2019-04-17 NOTE — Progress Notes (Signed)
Physical Therapy Session Note  Patient Details  Name: Brooke Washington MRN: [id number] Date of Birth: 02/14/1941  Today's Date: 04/17/2019 PT Individual Time: 0830-0932 PT Individual Time Calculation (min): 62 min   Short Term Goals: Week 1:  PT Short Term Goal 1 (Week 1): Patient to perform bed mobility min A. PT Short Term Goal 2 (Week 1): Patient to demonstrate standing balance with 1 UE support with S for functional task. PT Short Term Goal 3 (Week 1): Patient to ambulate 47' with min A with RW. PT Short Term Goal 4 (Week 1): Patient to negotiate 1 step with RW and CGA. Week 2:     Skilled Therapeutic Interventions/Progress Updates:   Pt received lying in bed, agreeable to therapy.  Pt requesting pain medication (Tylenol) prior to session, therefore RN notified and medication provided while in room.  Pt performed mobility at [facility] level as she needed slight assist to get LLE (heel) off EOB, but then did very well managing LE to floor. Sit<>stand from bed (elevated as her bed is at home) x 3 reps to go to restroom, don clothing and go back to restroom all at S level with min cues for safe hand placement.  Pt able to ambulate to/from restroom with RW at S level with cues for stepping sequence and upright posture with forward gaze.  Pt able to negotiate clothing and perform all peri care at S level with no overt LOB.  Pt did need seated rest break following second trip to restroom due to fatigue and pain.  Pt able to don clothing with just minimal assist to thread L foot through pant leg.  Sit<>stand from w/c at min/guard to min A level (first stand>sit was min A due to lower surface.  Pt agreeable to ambulate towards therapy gym.  Pt ambulated x 80' with RW at [facility] with cues for upright posture and forward gaze, along with increasing WB through LLE as able with step through gait pattern encouraged.  Pt did improve fluidity of gait pattern during session, but needed seated rest break following 80'.   Note pt reports more pain in L LE today.  Educated that she is doing more activity on rehab and may have increased soreness initially.  Recommended use of ice pack at end of session to reduce pain and inflammation.  Pt transferred to/from nustep at [facility] level with min cues for hand placement.  Performed seated nustep x 5 mins with BUE/LEs at level 2 resistance for overall strengthening and improving ROM.  Pt tolerated well.  Ambulated another 43' as above before needing to sit.  Pt assisted remainder of distance to room and left in w/c with all needs in reach.   Therapy Documentation Precautions:  Precautions Precautions: Fall Precaution Comments: watch O2 Restrictions Weight Bearing Restrictions: Yes LLE Weight Bearing: Weight bearing as tolerated  Pain:7/10 pain in hip, RN to room to provide tylenol during session.     Therapy/Group: Individual Therapy  Denice Bors 04/17/2019, 9:00 AM

## 2019-04-17 NOTE — Progress Notes (Signed)
Occupational Therapy Session Note  Patient Details  Name: Brooke Washington MRN: [id number] Date of Birth: [birth year]  Today's Date: 04/17/2019 OT Individual Time: 1110-1210 OT Individual Time Calculation (min): 60 min    Session 2:  OT Individual Time: 1445-1600 OT Individual Time Calculation (min): 75 min     Short Term Goals: Week 1:  OT Short Term Goal 1 (Week 1): Pt will thread BLE through pants with CGA using LRAD OT Short Term Goal 2 (Week 1): Pt will transfer to [facility] over toilet with min A OT Short Term Goal 3 (Week 1): Pt will complete tub transfer with TTB with min A  Skilled Therapeutic Interventions/Progress Updates:    Pt received supine with c/o pain in L hip, 8/10 but willing to participate in therapy. Pt completed sit > stand from min A. Pt used RW to transfer to w/c with CGA. Pt completed 100 ft of w/c propulsion to increase BUE strength/endurance. Pt transferred to therapy mat with min A. Pt completed distal reaching from EOM to simulate LB dressing. The height at which pt was reaching was graded up initially and then down as pt increased her hip flexion AROM. Session was interrupted by EKG needing to take place. Pt transferred back to her w/c with min A and was transported back to the room. Pt left supine with EKG tech. Returned to room 5 min later and pt EOB ready to continue therapy. Pt completed 35 ft of functional mobility with RW with CGA. Pt completed standing level dynamic standing balance task, with brief unilateral LE stance. Min A provided for balance support. Pt returned to room and was left supine with all needs met, bed alarm set.   Session 2: Pt received sitting up in w/c connected to IV fluids with no c/o pain. Pt was transported to therapy gym where she transferred to mat with min A. Pt sat EOM and completed 3x sit <> stands, requiring min-mod A. As session progressed pt became more fatigued. Pt completed dynamic standing balance task, standing on non-compliant  surface while BUE completed pipe tree. Pt required min cueing to complete pipe tree, often losing place in the puzzle when attempting to plan too far ahead. Pt took seated rest breaks between each trial. Pt then transferred supine on mat with LLE under a maxi slide pad. Pt was guided through AAROM hip abduction/adduction and knee flex/ext. Pt required the most assistance with abduction but reported no increased pain. Pt transitioned back to sitting EOM and the mat was raised up to reduce BOS from BLE. With her LLE pt completed resistive knee flex/ext to strength LLE needed for ADL transfers. Pt then completed dynamic sitting balance task to increase core stabilization with an UE strengthening component as well. Pt required several rest breaks during session. Discussed pain management strategies and return to activity. Pt was returned to her room where she was left supine with all needs met, bed alarm set.   Therapy Documentation Precautions:  Precautions Precautions: Fall Precaution Comments: watch O2 Restrictions Weight Bearing Restrictions: Yes LLE Weight Bearing: Weight bearing as tolerated   Therapy/Group: Individual Therapy  Curtis Sites 04/17/2019, 7:14 AM

## 2019-04-17 NOTE — Plan of Care (Signed)
  Problem: Consults Goal: RH GENERAL PATIENT EDUCATION Description: See Patient Education module for education specifics. Outcome: Progressing Goal: Nutrition Consult-if indicated Outcome: Progressing   Problem: RH BOWEL ELIMINATION Goal: RH STG MANAGE BOWEL WITH ASSISTANCE Description: STG Manage Bowel with min Assistance. Outcome: Progressing Goal: RH STG MANAGE BOWEL W/MEDICATION W/ASSISTANCE Description: STG Manage Bowel with Medication with min Assistance. Outcome: Progressing   Problem: RH BLADDER ELIMINATION Goal: RH STG MANAGE BLADDER WITH ASSISTANCE Description: STG Manage Bladder With min Assistance Outcome: Progressing   Problem: RH SKIN INTEGRITY Goal: RH STG SKIN FREE OF INFECTION/BREAKDOWN Outcome: Progressing Goal: RH STG ABLE TO PERFORM INCISION/WOUND CARE W/ASSISTANCE Description: STG Able To Perform Incision/Wound Care With min Assistance. Outcome: Progressing   Problem: RH SAFETY Goal: RH STG ADHERE TO SAFETY PRECAUTIONS W/ASSISTANCE/DEVICE Description: STG Adhere to Safety Precautions With mod I Assistance/Device. Outcome: Progressing   Problem: RH PAIN MANAGEMENT Goal: RH STG PAIN MANAGED AT OR BELOW PT'S PAIN GOAL Description: Less than 3 out of 10 Outcome: Progressing

## 2019-04-17 NOTE — Progress Notes (Addendum)
[facility] PHYSICAL MEDICINE & REHABILITATION PROGRESS NOTE  Subjective/Complaints: Patient seen sitting up in bed this morning.  She states she slept well overnight.  She states she feels good today.  ROS: Denies CP, shortness of breath, nausea, vomiting, diarrhea.  Objective: Vital Signs: Blood pressure (!) 132/58, pulse 75, temperature 97.9 F (36.6 C), temperature source Oral, resp. rate 16, height 5\' 4"  (1.626 m), weight 57.8 kg, SpO2 93 %. No results found. Recent Labs    04/16/19 1209  WBC 5.1  HGB 10.8*  HCT 31.9*  PLT 197   Recent Labs    04/17/19 0008 04/17/19 0809  NA 137 139  K 4.1 3.5  CL 103 104  CO2 25 26  GLUCOSE 102* 96  BUN 11 9  CREATININE 0.66 0.88  CALCIUM 8.4* 8.9    Physical Exam: BP (!) 132/58 (BP Location: Left Arm)   Pulse 75   Temp 97.9 F (36.6 C) (Oral)   Resp 16   Ht 5\' 4"  (1.626 m)   Wt 57.8 kg   LMP  (LMP Unknown)   SpO2 93%   BMI 21.87 kg/m  Constitutional: No distress . Vital signs reviewed. HENT: Normocephalic.  Atraumatic. Eyes: EOMI.  No discharge. Cardiovascular: No JVD. Respiratory: Normal effort. GI: Non-distended. Musc: Left hip with edema and tenderness, unchanged Neurological: Alert and oriented Motor: Bilateral upper extremities: 5/5 proximal to distal, stable Right lower extremity: Hip flexion 4/5, knee extension 4+/5, ankle dorsiflexion 5/5, stable Left lower extremity: Hip flexion 4-/5, knee extension 4/5, and dorsiflexion 5/5, stable Skin:  Incision with dressing C/D/I Psychiatric: She has a normal mood and affect. Her behavior is normal.   Assessment/Plan: 1. Functional deficits secondary to left hip fracture status post left THA  which require 3+ hours per day of interdisciplinary therapy in a comprehensive inpatient rehab setting.  Physiatrist is providing close team supervision and 24 hour management of active medical problems listed below.  Physiatrist and rehab team continue to assess barriers to  discharge/monitor patient progress toward functional and medical goals  Care Tool:  Bathing    Body parts bathed by patient: Right arm, Left arm, Chest, Abdomen, Front perineal area, Buttocks, Right upper leg, Left upper leg, Right lower leg, Left lower leg, Face         Bathing assist Assist Level: Minimal Assistance - Patient > 75%     Upper Body Dressing/Undressing Upper body dressing   What is the patient wearing?: Pull over shirt    Upper body assist Assist Level: Supervision/Verbal cueing    Lower Body Dressing/Undressing Lower body dressing      What is the patient wearing?: Underwear/pull up     Lower body assist Assist for lower body dressing: Moderate Assistance - Patient 50 - 74%     Toileting Toileting    Toileting assist Assist for toileting: Minimal Assistance - Patient > 75%     Transfers Chair/bed transfer  Transfers assist     Chair/bed transfer assist level: Moderate Assistance - Patient 50 - 74%     Locomotion Ambulation   Ambulation assist      Assist level: Minimal Assistance - Patient > 75% Assistive device: Walker-rolling Max distance: 20   Walk 10 feet activity   Assist     Assist level: Minimal Assistance - Patient > 75% Assistive device: Walker-rolling   Walk 50 feet activity   Assist Walk 50 feet with 2 turns activity did not occur: Safety/medical concerns  Assist level: Contact Guard/Touching assist  Assistive device: Walker-rolling    Walk 150 feet activity   Assist Walk 150 feet activity did not occur: Safety/medical concerns         Walk 10 feet on uneven surface  activity   Assist Walk 10 feet on uneven surfaces activity did not occur: Safety/medical concerns         Wheelchair     Assist Will patient use wheelchair at discharge?: No Type of Wheelchair: Manual    Wheelchair assist level: Supervision/Verbal cueing Max wheelchair distance: 65'    Wheelchair 50 feet with 2 turns  activity    Assist        Assist Level: Supervision/Verbal cueing   Wheelchair 150 feet activity     Assist Wheelchair 150 feet activity did not occur: Safety/medical concerns          Medical Problem List and Plan: 1.  Deficits with mobility, transfers, self-care secondary to left hip fracture status post left THA on 04/10/2019.  Continue CIR  Team conference today to discuss current and goals and coordination of care, home and environmental barriers, and discharge planning with nursing, case manager, and therapies.  2.  Antithrombotics: -DVT/anticoagulation:  Pharmaceutical: Lovenox             -antiplatelet therapy: ASA for DVT prophylaxis after discharge 3. Pain Management: Tramadol d/ced due to Qtc 4. Mood: LCSW to follow for evaluation and support             -antipsychotic agents: N/A 5. Neuropsych: This patient is capable of making decisions on her own behalf.  Celexa 20 daily d/ced given Qtc 6. Skin/Wound Care: Monitor wound for healing.  Nutritional supplements added. 7. Fluids/Electrolytes/Nutrition: Monitor I/O.    BMP within normal limits on 7/7 8. Hypoxic respiratory failure: COPD per films--encourage IS.  As needed inhaler ordered  Weaned supplemental oxygen. 9. ABLA: Status post transfusion on 04/13/2019.  Continue iron supplement.    Transfused 2 units PRBC on 7/3  Hemoglobin 10.8 on 7/6 10.  Blood pressure:   Controlled on 7/7  Monitor with increased mobility 11. H/o lymphoma: To hold Revlimid for now.  Follow-up with heme-onc for resumption after discharge 12. Constipation: Continue bowel regimen.  Improving 13.  Hyperglycemia  Relatively controlled on 7/7  Continue to monitor 14. Hypokalemia  Critical value 2.5, supplemented, 3.5 on 7/7. Discussed with therapies. Labs ordered for tomorrow.   Supplement initiated on 7/7  Magnesium 1.7 on 7/6   Supplementing IV on 7/7 15.  Extremely prolonged Qtc  ECG reviewed, suggesting prolonged  Qtc  Repeat ECG ordered  Will consider Cards consult if necessary  LOS: 3 days A FACE TO FACE EVALUATION WAS PERFORMED  Nelwyn Hebdon Lorie Phenix 04/17/2019, 11:06 AM

## 2019-04-18 ENCOUNTER — Inpatient Hospital Stay (HOSPITAL_COMMUNITY): Payer: Medicare Other | Admitting: Physical Therapy

## 2019-04-18 ENCOUNTER — Inpatient Hospital Stay (HOSPITAL_COMMUNITY): Payer: Medicare Other | Admitting: Occupational Therapy

## 2019-04-18 ENCOUNTER — Inpatient Hospital Stay (HOSPITAL_COMMUNITY): Payer: Medicare Other

## 2019-04-18 LAB — BASIC METABOLIC PANEL
Anion gap: 9 (ref 5–15)
BUN: 19 mg/dL (ref 8–23)
CO2: 26 mmol/L (ref 22–32)
Calcium: 8.8 mg/dL — ABNORMAL LOW (ref 8.9–10.3)
Chloride: 106 mmol/L (ref 98–111)
Creatinine, Ser: 0.77 mg/dL (ref 0.44–1.00)
GFR calc Af Amer: >60 mL/min (ref >60)
GFR calc non Af Amer: >60 mL/min (ref >60)
Glucose, Bld: 102 mg/dL — ABNORMAL HIGH (ref 70–99)
Potassium: 3.4 mmol/L — ABNORMAL LOW (ref 3.5–5.1)
Sodium: 141 mmol/L (ref 135–145)

## 2019-04-18 MED ORDER — CITALOPRAM HYDROBROMIDE 20 MG PO TABS
20.0000 mg | ORAL_TABLET | Freq: Every day | ORAL | Status: DC
  Administered 2019-04-18 – 2019-04-20 (×3): 20 mg via ORAL
  Filled 2019-04-18 (×3): qty 1

## 2019-04-18 MED ORDER — POTASSIUM CHLORIDE CRYS ER 20 MEQ PO TBCR
20.0000 meq | EXTENDED_RELEASE_TABLET | Freq: Two times a day (BID) | ORAL | Status: DC
  Administered 2019-04-18 – 2019-04-24 (×12): 20 meq via ORAL
  Filled 2019-04-18 (×13): qty 1

## 2019-04-18 MED ORDER — POLYETHYLENE GLYCOL 3350 17 G PO PACK: 17.0000 g | PACK | Freq: Every day | ORAL | Status: DC | PRN

## 2019-04-18 NOTE — Progress Notes (Signed)
Occupational Therapy Session Note  Patient Details  Name: Brooke Washington MRN: [id number] Date of Birth: 07-09-1941  Today's Date: 04/18/2019 OT Individual Time: 0900-1012 OT Individual Time Calculation (min): 72 min    Short Term Goals: Week 1:  OT Short Term Goal 1 (Week 1): Pt will thread BLE through pants with CGA using LRAD OT Short Term Goal 2 (Week 1): Pt will transfer to [facility] over toilet with min A OT Short Term Goal 3 (Week 1): Pt will complete tub transfer with TTB with min A  Skilled Therapeutic Interventions/Progress Updates:    Patient in bed, ready for therapy session.   She is pleasant and cooperative, pain with transitional movement.  Bed mobility:  Supine to/from SSP with min A to manage left LE.  Sit to stand and SPT with RW to/from bed, w/c, arm chair with CG, occ min A to stand.  Completed grooming tasks at sink in stance with CS.  UB dressing set up and LB dressing with mod A seated edge of bed.  Reviewed and practiced basic reach and transport of items in the kitchen setting.  She requires CS and moderate cues for opening cabinets/refrigerator.  Completed seated and standing UB conditioning activities with good tolerance.  Returned to bed at close of session with min A.  Provided ice pack for left femur pain.  Bed alarm set and call bell/phone in reach.    Therapy Documentation Precautions:  Precautions Precautions: Fall Precaution Comments: watch O2 Restrictions Weight Bearing Restrictions: Yes LLE Weight Bearing: Weight bearing as tolerated General:   Vital Signs:   Pain: Pain Assessment Pain Scale: 0-10 Pain Score: 3  Pain Type: Surgical pain Pain Location: Leg Pain Orientation: Left Pain Descriptors / Indicators: Aching Pain Intervention(s): Repositioned Other Treatments:     Therapy/Group: Individual Therapy  Carlos Levering 04/18/2019, 12:16 PM

## 2019-04-18 NOTE — Progress Notes (Signed)
VAST RN to bedside regarding line care and missed lab this am.  Pt asked about her antidepressant not being given. After looking at MD notes, noted it was discontinued d/t abnormalities with heart rhythm. Educated pt and advised I would request unit RN contact physician about plan of care. Advised charge nurse as unit RN, Santiago Glad unavailable at this time.

## 2019-04-18 NOTE — Progress Notes (Addendum)
Physical Therapy Session Note  Patient Details  Name: Brooke Washington MRN: [id number] Date of Birth: [birth year]  Today's Date: 04/18/2019 PT Individual Time: 1130-1155 and 1300-1340 PT Individual Time Calculation (min): 25 min and 40 min   Short Term Goals: Week 1:  PT Short Term Goal 1 (Week 1): Patient to perform bed mobility min A. PT Short Term Goal 2 (Week 1): Patient to demonstrate standing balance with 1 UE support with S for functional task. PT Short Term Goal 3 (Week 1): Patient to ambulate 93' with min A with RW. PT Short Term Goal 4 (Week 1): Patient to negotiate 1 step with RW and CGA.  Skilled Therapeutic Interventions/Progress Updates:    pt performed gait 150' x 2 with RW and improving step through pattern with cuing.  kinetron while maintaining hip precautions x 5 minutes for LE strength and AAROM.  Pt performs toilet transfer with supervision. Pt left on toilet with nursing present.  Session 2: Pt continues with hip "soreness", repositioned as needed, pt denies need for pain medicine.  Pt performs gait 2 x 150' with supervision and cues for step through pattern.  Pt performs standing therex 2 x 10 hip abd, hip ext, HS curls. Side and backwards gait with RW and CGA.  Pt left in bed with all needs at hand, alarm set.  Therapy Documentation Precautions:  Precautions Precautions: Fall Precaution Comments: watch O2 Restrictions Weight Bearing Restrictions: Yes LLE Weight Bearing: Weight bearing as tolerated Pain: Pain Assessment Pain Scale: 0-10 Pain Score: 3  Pain Type: Surgical pain Pain Location: Leg Pain Orientation: Left Pain Descriptors / Indicators: Aching Pain Intervention(s): Repositioned    Therapy/Group: Individual Therapy  Kelena Garrow 04/18/2019, 12:37 PM

## 2019-04-18 NOTE — Progress Notes (Signed)
[facility] PHYSICAL MEDICINE & REHABILITATION PROGRESS NOTE  Subjective/Complaints: Patient seen sitting up in bed this morning.  She states she slept well overnight.  She notes improvement in strength.  Later informed by PA, patient's request for antidepressant.  ROS: Denies CP, shortness of breath, nausea, vomiting, diarrhea.  Objective: Vital Signs: Blood pressure 133/60, pulse 84, temperature 97.7 F (36.5 C), temperature source Oral, resp. rate 18, height 5\' 4"  (1.626 m), weight 58.2 kg, SpO2 92 %. No results found. Recent Labs    04/16/19 1209  WBC 5.1  HGB 10.8*  HCT 31.9*  PLT 197   Recent Labs    04/17/19 0809 04/17/19 1620 04/18/19 1105  NA 139  --  141  K 3.5 3.7 3.4*  CL 104  --  106  CO2 26  --  26  GLUCOSE 96  --  102*  BUN 9  --  19  CREATININE 0.88  --  0.77  CALCIUM 8.9  --  8.8*    Physical Exam: BP 133/60 (BP Location: Left Arm)   Pulse 84   Temp 97.7 F (36.5 C) (Oral)   Resp 18   Ht 5\' 4"  (1.626 m)   Wt 58.2 kg   LMP  (LMP Unknown)   SpO2 92%   BMI 22.04 kg/m  Constitutional: No distress . Vital signs reviewed. HENT: Normocephalic.  Atraumatic. Eyes: EOMI.  No discharge. Cardiovascular: No JVD. Respiratory: Normal effort. GI: Non-distended. Musc: Left hip with edema and tenderness, stable  Neurological: Alert and oriented Motor: Bilateral upper extremities: 5/5 proximal to distal, stable Right lower extremity: Hip flexion 4/5, knee extension 4+/5, ankle dorsiflexion 5/5, unchanged  Left lower extremity: Hip flexion 4-/5, knee extension 4/5, and dorsiflexion 5/5, improving  Skin:  Incision with dressing C/D/I Psychiatric: She has a normal mood and affect. Her behavior is normal.   Assessment/Plan: 1. Functional deficits secondary to left hip fracture status post left THA  which require 3+ hours per day of interdisciplinary therapy in a comprehensive inpatient rehab setting.  Physiatrist is providing close team supervision and 24  hour management of active medical problems listed below.  Physiatrist and rehab team continue to assess barriers to discharge/monitor patient progress toward functional and medical goals  Care Tool:  Bathing    Body parts bathed by patient: Right arm, Left arm, Chest, Abdomen, Front perineal area, Buttocks, Right upper leg, Left upper leg, Right lower leg, Left lower leg, Face         Bathing assist Assist Level: Minimal Assistance - Patient > 75%     Upper Body Dressing/Undressing Upper body dressing   What is the patient wearing?: Bra, Pull over shirt    Upper body assist Assist Level: Set up assist    Lower Body Dressing/Undressing Lower body dressing      What is the patient wearing?: Underwear/pull up, Pants     Lower body assist Assist for lower body dressing: Moderate Assistance - Patient 50 - 74%     Toileting Toileting    Toileting assist Assist for toileting: Minimal Assistance - Patient > 75%     Transfers Chair/bed transfer  Transfers assist     Chair/bed transfer assist level: Minimal Assistance - Patient > 75%     Locomotion Ambulation   Ambulation assist      Assist level: Contact Guard/Touching assist Assistive device: Walker-rolling Max distance: 80   Walk 10 feet activity   Assist     Assist level: Contact Guard/Touching assist Assistive  device: Walker-rolling   Walk 50 feet activity   Assist Walk 50 feet with 2 turns activity did not occur: Safety/medical concerns  Assist level: Contact Guard/Touching assist Assistive device: Walker-rolling    Walk 150 feet activity   Assist Walk 150 feet activity did not occur: Safety/medical concerns         Walk 10 feet on uneven surface  activity   Assist Walk 10 feet on uneven surfaces activity did not occur: Safety/medical concerns         Wheelchair     Assist Will patient use wheelchair at discharge?: No Type of Wheelchair: Manual    Wheelchair assist  level: Supervision/Verbal cueing Max wheelchair distance: 33'    Wheelchair 50 feet with 2 turns activity    Assist        Assist Level: Supervision/Verbal cueing   Wheelchair 150 feet activity     Assist Wheelchair 150 feet activity did not occur: Safety/medical concerns          Medical Problem List and Plan: 1.  Deficits with mobility, transfers, self-care secondary to left hip fracture status post left THA on 04/10/2019.  Continue CIR 2.  Antithrombotics: -DVT/anticoagulation:  Pharmaceutical: Lovenox             -antiplatelet therapy: ASA for DVT prophylaxis after discharge 3. Pain Management: Tramadol d/ced due to Qtc 4. Mood: LCSW to follow for evaluation and support             -antipsychotic agents: N/A 5. Neuropsych: This patient is capable of making decisions on her own behalf.  Celexa 20 daily d/ced given Qtc 6. Skin/Wound Care: Monitor wound for healing.  Nutritional supplements added. 7. Fluids/Electrolytes/Nutrition: Monitor I/O.   8. Hypoxic respiratory failure: COPD per films--encourage IS.  As needed inhaler ordered  Weaned supplemental oxygen. 9. ABLA: Status post transfusion on 04/13/2019.  Continue iron supplement.    Transfused 2 units PRBC on 7/3  Hemoglobin 10.8 on 7/6 10.  Blood pressure:   Labile on 7/8  Monitor with increased mobility 11. H/o lymphoma: To hold Revlimid for now.  Follow-up with heme-onc for resumption after discharge 12. Constipation: Continue bowel regimen.  Improving 13.  Hyperglycemia  Relatively controlled on 727  Continue to monitor 14. Hypokalemia  Potassium 3.4 on 7/8   Supplement initiated on 7/7, increased on 7/8  Magnesium 1.7 on 7/6   Supplementing IV on 7/7 15.  Extremely prolonged Qtc  ECG reviewed, suggesting prolonged Qtc  Repeat ECG reviewed, elevated, but improved  LOS: 4 days A FACE TO FACE EVALUATION WAS PERFORMED  Brianca Fortenberry Lorie Phenix 04/18/2019, 1:18 PM

## 2019-04-18 NOTE — Progress Notes (Signed)
Occupational Therapy Session Note  Patient Details  Name: Brooke Washington MRN: [id number] Date of Birth: 03-02-1941  Today's Date: 04/18/2019 OT Individual Time: 1535-1630 OT Individual Time Calculation (min): 55 min    Short Term Goals: Week 1:  OT Short Term Goal 1 (Week 1): Pt will thread BLE through pants with CGA using LRAD OT Short Term Goal 2 (Week 1): Pt will transfer to [facility] over toilet with min A OT Short Term Goal 3 (Week 1): Pt will complete tub transfer with TTB with min A  Skilled Therapeutic Interventions/Progress Updates:    Pt received completing toileting tasks with NT. Pt stood at sink and wash hands with close (S). Pt completed 120 ft of functional mobility to the dayroom with CGA. Pt transferred onto recumbent trainer to increase AROM of LLE and reduce stiffness, as described by pt. Pt reported big reduction in stiffness and pain overall. Pt completed another 75 ft of functional mobility with RW with CGA. Pt required seated rest break following and then was transported to therapy gym. Pt completed blocked practice of sit <> stand with prolonged stand with BUE strengthening component. Pt required seated rest breaks between each trial. Pt returned to her w/c and was transported back to her room. Pt was left supine and provided with an ice pack for pain relief.   Therapy Documentation Precautions:  Precautions Precautions: Fall Precaution Comments: watch O2 Restrictions Weight Bearing Restrictions: Yes LLE Weight Bearing: Weight bearing as tolerated   Therapy/Group: Individual Therapy  Curtis Sites 04/18/2019, 7:13 AM

## 2019-04-18 NOTE — Progress Notes (Signed)
Patient not emptying bladder completely. I/O cath q6hr for pvr >376mL. Patient voided and scanned for 481mL and refused to let us cath her. Patient educated on risks of retaining urine and still refused.  Chilton Si, LPN

## 2019-04-19 ENCOUNTER — Inpatient Hospital Stay (HOSPITAL_COMMUNITY): Payer: Medicare Other | Admitting: Occupational Therapy

## 2019-04-19 ENCOUNTER — Inpatient Hospital Stay (HOSPITAL_COMMUNITY): Payer: Medicare Other | Admitting: Rehabilitation

## 2019-04-19 ENCOUNTER — Inpatient Hospital Stay (HOSPITAL_COMMUNITY): Payer: Medicare Other

## 2019-04-19 NOTE — Progress Notes (Signed)
Physical Therapy Session Note  Patient Details  Name: Brooke Washington MRN: [id number] Date of Birth: 05-Dec-1940  Today's Date: 04/19/2019 PT Individual Time: 0830-0930 PT Individual Time Calculation (min): 60 min   Short Term Goals: Week 1:  PT Short Term Goal 1 (Week 1): Patient to perform bed mobility min A. PT Short Term Goal 2 (Week 1): Patient to demonstrate standing balance with 1 UE support with S for functional task. PT Short Term Goal 3 (Week 1): Patient to ambulate 42' with min A with RW. PT Short Term Goal 4 (Week 1): Patient to negotiate 1 step with RW and CGA.  Skilled Therapeutic Interventions/Progress Updates:    Functional bed mobility using bedrails with supervision and extra time to come to EOB. Pt managing LLE with UE's for support due to pain and stiffness. Functional gait in room with RW with close S/steadying assist to ambulate in and out of bathroom. performed toilet transfers with close S and RW. Pt able to perform hygiene independently and clothing management with steadying assist in standing. S for dynamic standing balance at sink for oral hygiene and to apply deodorant. In standing, pt able to pull clothing out of suitcase with steadying assist for balance while reaching with 1 UE support. Engaged in dressing seated EOB and in standing to pull up pants. Pt required min assist to help thread LLE into pant leg but able to perform everything else independently with extra time (shirt and bra). Did require min assist for shoes for LLE. Dynamic gait through obstacle course to simulate home environment mobility with CGA/S overall with cues for positioning of RW during turning and cues for increased step length with LLE. Pt with noted increased pain in LLE this morning and antalgic gait apparent. End of session returned back to bed with ice pack applied to LLE for pain management and edema control. Required min assist for lifting LLE into the bed but then able to reposition once  supported. May benefit from trial with leg lifter for aid with LLE management with bed mobility in future session. Education provided on energy conservation techniques and ways to continue increased mobility in the home upon d/c. Pt making excellent progress and very motivated.   Therapy Documentation Precautions:  Precautions Precautions: Fall Precaution Comments: watch O2 Restrictions Weight Bearing Restrictions: Yes LLE Weight Bearing: Weight bearing as tolerated   Pain:  medicated at start of session for pain in LLE. Ice pack applied at end of session. Pt did not rate # just reports "it hurts a lot today"   Therapy/Group: Individual Therapy  Canary Brim Ivory Broad, PT, DPT, CBIS  04/19/2019, 9:44 AM

## 2019-04-19 NOTE — Patient Care Conference (Signed)
Inpatient RehabilitationTeam Conference and Plan of Care Update Date: 04/17/2019   Time: 10:45 AM    Patient Name: Brooke Washington      Medical Record Number: [id number]  Date of Birth: 1941-08-08 Sex: Female         Room/Bed: 4W19C/4W19C-01 Payor Info: Payor: MEDICARE / Plan: MEDICARE PART A AND B / Product Type: *No Product type* /    Admitting Diagnosis: 2. TBI Team  LT. Hip FX; 11-13days  Admit Date/Time:  04/14/2019  4:05 PM Admission Comments: No comment available   Primary Diagnosis:  S/p left hip fracture Principal Problem: S/p left hip fracture  Patient Active Problem List   Diagnosis Date Noted  . Hypokalemia   . Prolonged Q-T interval on ECG   . Labile blood pressure   . S/p left hip fracture 04/15/2019  . Hyperglycemia   . Chronic obstructive pulmonary disease ([place])   . Acute blood loss anemia   . Drug induced constipation   . Status post total hip replacement, left   . Supplemental oxygen dependent   . Postoperative pain   . Closed left hip fracture, initial encounter ([place]) 04/10/2019  . Other fatigue 04/09/2019  . Leukopenia due to antineoplastic chemotherapy ([place]) 01/25/2019  . Bilateral arm pain 12/14/2018  . Pancytopenia, acquired ([place]) 06/20/2018  . Weight loss 06/20/2018  . Physical deconditioning 04/10/2018  . 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 Hypotension, unspecified 03/29/2018  . Chronic respiratory failure with hypoxia ([place]) 12/30/2017  . Other constipation 12/29/2017  . Neutropenic fever ([place]) 12/18/2017  . Thrombocytopenia ([place]) 12/18/2017  . Abdominal fullness 12/18/2017  . Acute urinary retention 12/18/2017  . Mucositis due to antineoplastic therapy 12/15/2017  . Heart burn 12/15/2017  . Frequent headaches 12/15/2017  . 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 Goals of care, counseling/discussion 11/29/2017  . Pathological fracture in neoplastic disease, right humerus, initial encounter for fracture  11/24/2017  . Hyperuricemia 11/22/2017  . Pathological fracture due to neoplastic disease, initial encounter 11/21/2017  . COPD GOLD II 07/05/2017  . COLONIC POLYPS, HYPERPLASTIC, HX OF 05/28/2010  . 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 Depression 05/05/2007    Expected Discharge Date: Expected Discharge Date: 04/30/19  Team Members Present: Social Worker Present: Lennart Pall, LCSW Nurse Present: Ellison Carwin, LPN PT Present: Donnamae Jude, PT OT Present: Laverle Hobby, OT SLP Present: Weston Anna, SLP     Current Status/Progress Goal Weekly Team Focus  Medical   Deficits with mobility, transfers, self-care secondary to left hip fracture status post left THA on 04/10/2019.  Improve mobility, BP, ABLA, pain, COPD, hyperglycemia  See above   Bowel/Bladder   continent of bowel, LBM 04/16/19, loose BMs today, was given lomotil, in/out caths, not emptying bladder, last night refused cath  regain ability to void  continue to monitor for changes, in.out caths as needed   Swallow/Nutrition/ Hydration             ADL's   mod A LB ADLs, mod A transfers, stiffness/pain in operative hip  Supervision ADLs and transfers  ADL retraining, ADL transfers, functional activity tolerance, use of DME/AE   Mobility   CGA bed mobility, CGA/min assist transfers, CGA ambulating 1ft using RW  mod-I bed mobility, supervision overall  transfer training, gait training, actiivty tolerance, B LE strengthening   Communication             Safety/Cognition/ Behavioral Observations  Pain   no c/o pain on shift, has tylenol, tramadol & robaxin PRN  pain scale <4/10  assess & treat as needed   Skin   incision to left hip with surgical drsg intact  no new areas of skin break down on shift  assess q shift    Rehab Goals Patient on target to meet rehab goals: Yes *See Care Plan and progress notes for long and short-term goals.     Barriers to Discharge  Current  Status/Progress Possible Resolutions Date Resolved   Physician    Medical stability     See above  Therapies, follow vitals, follow labs      Nursing                  PT                    OT                  SLP                SW                Discharge Planning/Teaching Needs:  Home with spouse who can provide 24/7 assistance (and spoiling according to pt)  Teaching needs TBD   Team Discussion:  O2 weaned;  Incision clean.  occ caths needed.  Min assist ADLs;  s - CGA with mobility.  Supervision goals overall  Revisions to Treatment Plan:  NA    Continued Need for Acute Rehabilitation Level of Care: The patient requires daily medical management by a physician with specialized training in physical medicine and rehabilitation for the following conditions: Daily direction of a multidisciplinary physical rehabilitation program to ensure safe treatment while eliciting the highest outcome that is of practical value to the patient.: Yes Daily medical management of patient stability for increased activity during participation in an intensive rehabilitation regime.: Yes Daily analysis of laboratory values and/or radiology reports with any subsequent need for medication adjustment of medical intervention for : Post surgical problems;Blood pressure problems;Pulmonary problems   I attest that I was present, lead the team conference, and concur with the assessment and plan of the team.   Haidynn Almendarez 04/19/2019, 4:01 PM    Team conference was held via web/ teleconference due to Ama - 19

## 2019-04-19 NOTE — Progress Notes (Signed)
Physical Therapy Session Note  Patient Details  Name: Brooke Washington MRN: [id number] Date of Birth: November 25, 1940  Today's Date: 04/19/2019 PT Individual Time: 1406-1500 PT Individual Time Calculation (min): 54 min   Short Term Goals: Week 1:  PT Short Term Goal 1 (Week 1): Patient to perform bed mobility min A. PT Short Term Goal 2 (Week 1): Patient to demonstrate standing balance with 1 UE support with S for functional task. PT Short Term Goal 3 (Week 1): Patient to ambulate 56' with min A with RW. PT Short Term Goal 4 (Week 1): Patient to negotiate 1 step with RW and CGA. Week 2:     Skilled Therapeutic Interventions/Progress Updates:   Pt received lying in bed, agreeable to therapy this afternoon.  Pt utilized leg lifter to self assist LLE off EOB at S level.  Performed sit<>stand from bed/nustep at S level with min cues for safety and bringing RW with her when turning to sit.  Performed seated nustep x 7 mins at level 4 resistance with  BUEs/LEs for overall strengthening and endurance but also to address L ROM deficits.  Pt reports hip loosened up well on nustep.  While on nustep, discussed getting into her bed and that bed is very high at home.  Educated for husband to look for sturdy step stool (approx 6") so that she can back up to step and step up to sit down to bed.  Then practiced this during session with RW at min A level (did require slightly heavier min A to stand as she did not shift forward enough initially).  While on therapy mat, had pt roll to prone position to stretch L hip.  Pt able to do very well with increased time.  Educated and demonstrated how to lift to elbows to increase stretch.  Pt not able to maintain this very long, therefore had her just lie prone x 5 mins.  Pt then returned to w/c and assisted to main gym to perform single curb step as she has single step to enter home.  Pt able to perform at min/guard level with cues for sequencing and safety with RW.  Also needs  cues to back up on curb prior to turning body/RW to ensure wheels of RW did not go off edge of step.  Pt assisted back to room and left with all needs in reach.   Therapy Documentation Precautions:  Precautions Precautions: Fall Precaution Comments: watch O2 Restrictions Weight Bearing Restrictions: Yes LLE Weight Bearing: Weight bearing as tolerated  Pain: Pt reports feeling sore, but not wanting to take medication during session.    Therapy/Group: Individual Therapy  Denice Bors 04/19/2019, 2:23 PM

## 2019-04-19 NOTE — Progress Notes (Signed)
Brooke Washington PHYSICAL MEDICINE & REHABILITATION PROGRESS NOTE  Subjective/Complaints: Patient seen sitting up in bed this morning.  She states he slept well overnight.  Discussed antidepressant medication with patient.  ROS: Denies CP, shortness of breath, nausea, vomiting, diarrhea.  Objective: Vital Signs: Blood pressure 102/62, pulse 76, temperature 98.3 F (36.8 C), temperature source Oral, resp. rate 17, height 5\' 4"  (1.626 m), weight 58.2 kg, SpO2 92 %. No results found. No results for input(s): WBC, HGB, HCT, PLT in the last 72 hours. Recent Labs    04/17/19 0809 04/17/19 1620 04/18/19 1105  NA 139  --  141  K 3.5 3.7 3.4*  CL 104  --  106  CO2 26  --  26  GLUCOSE 96  --  102*  BUN 9  --  19  CREATININE 0.88  --  0.77  CALCIUM 8.9  --  8.8*    Physical Exam: BP 102/62 (BP Location: Left Arm)   Pulse 76   Temp 98.3 F (36.8 C) (Oral)   Resp 17   Ht 5\' 4"  (1.626 m)   Wt 58.2 kg   LMP  (LMP Unknown)   SpO2 92%   BMI 22.04 kg/m  Constitutional: No distress . Vital signs reviewed. HENT: Normocephalic.  Atraumatic. Eyes: EOMI.  No discharge. Cardiovascular: No JVD. Respiratory: Normal effort. GI: Non-distended. Musc: Left hip with edema and tenderness, improving Neurological: Alert and oriented Motor: Bilateral upper extremities: 5/5 proximal to distal, stable Right lower extremity: Hip flexion 4/5, knee extension 4+/5, ankle dorsiflexion 5/5, stable Left lower extremity: Hip flexion 4-/5, knee extension 4/5, and dorsiflexion 5/5, gradually improving Skin:  Incision with dressing C/D/I Psychiatric: She has a normal mood and affect. Her behavior is normal.   Assessment/Plan: 1. Functional deficits secondary to left hip fracture status post left THA  which require 3+ hours per day of interdisciplinary therapy in a comprehensive inpatient rehab setting.  Physiatrist is providing close team supervision and 24 hour management of active medical problems listed  below.  Physiatrist and rehab team continue to assess barriers to discharge/monitor patient progress toward functional and medical goals  Care Tool:  Bathing    Body parts bathed by patient: Right arm, Left arm, Chest, Abdomen, Front perineal area, Buttocks, Right upper leg, Left upper leg, Right lower leg, Left lower leg, Face         Bathing assist Assist Level: Minimal Assistance - Patient > 75%     Upper Body Dressing/Undressing Upper body dressing   What is the patient wearing?: Bra, Pull over shirt    Upper body assist Assist Level: Independent    Lower Body Dressing/Undressing Lower body dressing      What is the patient wearing?: Underwear/pull up, Pants     Lower body assist Assist for lower body dressing: Minimal Assistance - Patient > 75%     Toileting Toileting    Toileting assist Assist for toileting: Contact Guard/Touching assist     Transfers Chair/bed transfer  Transfers assist     Chair/bed transfer assist level: Supervision/Verbal cueing     Locomotion Ambulation   Ambulation assist      Assist level: Contact Guard/Touching assist Assistive device: Walker-rolling Max distance: 40   Walk 10 feet activity   Assist     Assist level: Contact Guard/Touching assist Assistive device: Walker-rolling   Walk 50 feet activity   Assist Walk 50 feet with 2 turns activity did not occur: Safety/medical concerns  Assist level: Contact Guard/Touching assist  Assistive device: Walker-rolling    Walk 150 feet activity   Assist Walk 150 feet activity did not occur: Safety/medical concerns         Walk 10 feet on uneven surface  activity   Assist Walk 10 feet on uneven surfaces activity did not occur: Safety/medical concerns         Wheelchair     Assist Will patient use wheelchair at discharge?: No Type of Wheelchair: Manual    Wheelchair assist level: Supervision/Verbal cueing Max wheelchair distance: 74'     Wheelchair 50 feet with 2 turns activity    Assist        Assist Level: Supervision/Verbal cueing   Wheelchair 150 feet activity     Assist Wheelchair 150 feet activity did not occur: Safety/medical concerns          Medical Problem List and Plan: 1.  Deficits with mobility, transfers, self-care secondary to left hip fracture status post left THA on 04/10/2019.  Continue CIR 2.  Antithrombotics: -DVT/anticoagulation:  Pharmaceutical: Lovenox             -antiplatelet therapy: ASA for DVT prophylaxis after discharge 3. Pain Management: Tramadol d/ced due to Qtc  Controlled on 7/9 4. Mood: LCSW to follow for evaluation and support             -antipsychotic agents: N/A 5. Neuropsych: This patient is capable of making decisions on her own behalf.  Celexa 20 daily d/ced given Qtc, restarted per patient request 6. Skin/Wound Care: Monitor wound for healing.  Nutritional supplements added. 7. Fluids/Electrolytes/Nutrition: Monitor I/O.   8. Hypoxic respiratory failure: COPD per films--encourage IS.  As needed inhaler ordered  Weaned supplemental oxygen. 9. ABLA: Status post transfusion on 04/13/2019.  Continue iron supplement.    Transfused 2 units PRBC on 7/3  Hemoglobin 10.8 on 7/6, labs ordered for tomorrow 10.  Blood pressure:   Controlled on 7/9  Monitor with increased mobility 11. H/o lymphoma: To hold Revlimid for now.  Follow-up with heme-onc for resumption after discharge 12. Constipation: Continue bowel regimen.  Improving 13.  Hyperglycemia  Controlled on 7/9  Continue to monitor 14. Hypokalemia  Potassium 3.4 on 7/8, labs ordered for tomorrow   Supplement initiated on 7/7, increased on 7/8  Magnesium 1.7 on 7/6, labs ordered for tomorrow   Supplementing IV on 7/7 15.  Prolonged Qtc  ECG reviewed, suggesting prolonged Qtc  Repeat ECG reviewed, remains elevated, will order ECG again for tomorrow and consider decreasing SSRI  LOS: 5 days A FACE TO  FACE EVALUATION WAS PERFORMED  Ankit Lorie Phenix 04/19/2019, 4:07 PM

## 2019-04-19 NOTE — Progress Notes (Signed)
Social Work [name], female   DOB: [birth year], 78 y.o.   MRN: [id number]   Pt aware and agreeable with targeted d/c date of 7/20 and supervision goals. She is pleased with progress and states, "theyre putting me through the wringer."  Continue to follow.  Sari Cogan, LCSW

## 2019-04-19 NOTE — Progress Notes (Signed)
Occupational Therapy Session Note  Patient Details  Name: Brooke Washington MRN: [id number] Date of Birth: Mar 02, 1941  Today's Date: 04/19/2019 OT Individual Time: 1105-1200 OT Individual Time Calculation (min): 55 min    Short Term Goals: Week 1:  OT Short Term Goal 1 (Week 1): Pt will thread BLE through pants with CGA using LRAD OT Short Term Goal 2 (Week 1): Pt will transfer to [facility] over toilet with min A OT Short Term Goal 3 (Week 1): Pt will complete tub transfer with TTB with min A  Skilled Therapeutic Interventions/Progress Updates:    Pt seen for OT session focusing on functional mobility, activity tolerance and pain management. Pt in supine upon arrival, denied pain at rest, howver, increased pain with functional ambulation. RN made aware and administered pain meds during session.  Pt introduced to leg lifter and demonstration provided regarding use. She return demonstrated ability to transfer supine<> EOB with use of leg lifter and supervision, HOB elevated as it can be at home.  She donned slide on shoes while seated on EOB with set-up. She ambulated into bathroom using RW with steadying assist. Completed toileting task with steadying assist following B/B void. She returned to sink and completed hand hygiene standing at sink.  Seated rest break provided on EOB.  She ambulated throughout session with CGA. Multi-modal cuing provided to encourage upright standing posture, forward gaze, wider BOS when ambulating. Required VCs for initiation of need for seated rest break, education provided regarding reducing fall risk with rest breaks.  Complaints of "tightness" in L quad. Light massage provided with pt reporting decrease in pain following massage. She ambulated back to room at end of session, desired to sit EOB awaiting lunch. Bed alarm on and all needs in reach.   Therapy Documentation Precautions:  Precautions Precautions: Fall Precaution Comments: watch O2 Restrictions Weight  Bearing Restrictions: Yes LLE Weight Bearing: Weight bearing as tolerated   Therapy/Group: Individual Therapy  Kerin Cecchi L 04/19/2019, 7:00 AM

## 2019-04-20 ENCOUNTER — Inpatient Hospital Stay (HOSPITAL_COMMUNITY): Payer: Medicare Other | Admitting: Physical Therapy

## 2019-04-20 ENCOUNTER — Inpatient Hospital Stay (HOSPITAL_COMMUNITY): Payer: Medicare Other | Admitting: Occupational Therapy

## 2019-04-20 LAB — CBC WITH DIFFERENTIAL/PLATELET
Abs Immature Granulocytes: 0.07 10*3/uL (ref 0.00–0.07)
Basophils Absolute: 0.1 10*3/uL (ref 0.0–0.1)
Basophils Relative: 1 %
Eosinophils Absolute: 0.4 10*3/uL (ref 0.0–0.5)
Eosinophils Relative: 7 %
HCT: 31 % — ABNORMAL LOW (ref 36.0–46.0)
Hemoglobin: 10.2 g/dL — ABNORMAL LOW (ref 12.0–15.0)
Immature Granulocytes: 1 %
Lymphocytes Relative: 22 %
Lymphs Abs: 1.1 10*3/uL (ref 0.7–4.0)
MCH: 32.7 pg (ref 26.0–34.0)
MCHC: 32.9 g/dL (ref 30.0–36.0)
MCV: 99.4 fL (ref 80.0–100.0)
Monocytes Absolute: 0.8 10*3/uL (ref 0.1–1.0)
Monocytes Relative: 15 %
Neutro Abs: 2.7 10*3/uL (ref 1.7–7.7)
Neutrophils Relative %: 54 %
Platelets: 277 10*3/uL (ref 150–400)
RBC: 3.12 MIL/uL — ABNORMAL LOW (ref 3.87–5.11)
RDW: 15.9 % — ABNORMAL HIGH (ref 11.5–15.5)
WBC: 5.2 10*3/uL (ref 4.0–10.5)
nRBC: 0 % (ref 0.0–0.2)

## 2019-04-20 LAB — BASIC METABOLIC PANEL
Anion gap: 9 (ref 5–15)
BUN: 20 mg/dL (ref 8–23)
CO2: 26 mmol/L (ref 22–32)
Calcium: 8.8 mg/dL — ABNORMAL LOW (ref 8.9–10.3)
Chloride: 106 mmol/L (ref 98–111)
Creatinine, Ser: 0.74 mg/dL (ref 0.44–1.00)
GFR calc Af Amer: >60 mL/min (ref >60)
GFR calc non Af Amer: >60 mL/min (ref >60)
Glucose, Bld: 88 mg/dL (ref 70–99)
Potassium: 4.3 mmol/L (ref 3.5–5.1)
Sodium: 141 mmol/L (ref 135–145)

## 2019-04-20 LAB — MAGNESIUM: Magnesium: 1.8 mg/dL (ref 1.7–2.4)

## 2019-04-20 MED ORDER — SERTRALINE HCL 25 MG PO TABS
12.5000 mg | ORAL_TABLET | Freq: Every day | ORAL | Status: DC
  Administered 2019-04-20 – 2019-04-25 (×6): 12.5 mg via ORAL
  Filled 2019-04-20 (×6): qty 0.5

## 2019-04-20 NOTE — Progress Notes (Signed)
Physical Therapy Session Note  Patient Details  Name: Brooke Washington MRN: [id number] Date of Birth: 1941-10-04  Today's Date: 04/20/2019 PT Individual Time: 0830-0925 PT Individual Time Calculation (min): 55 min   Short Term Goals: Week 1:  PT Short Term Goal 1 (Week 1): Patient to perform bed mobility min A. PT Short Term Goal 2 (Week 1): Patient to demonstrate standing balance with 1 UE support with S for functional task. PT Short Term Goal 3 (Week 1): Patient to ambulate 9' with min A with RW. PT Short Term Goal 4 (Week 1): Patient to negotiate 1 step with RW and CGA.  Skilled Therapeutic Interventions/Progress Updates:    pt performs gait in room to bathroom, toileting and toilet transfers all with supervision with RW.  Pt performs standing balance with supervision for hand washing and brushing teeth. Pt requires assistance for lower body dressing due to increased hip pain this session.  Gait in controlled environment and ADL apartment for home environment simulation with supervision with RW.  Distance limited to 100' today due to pain.  Furniture transfers to couch and recliner with min A due to low surfaces. Discussed raising furniture at home. Pt performs seated therex for Lt hip strengthening with LAQ, HS curl, hip abd/add, add squeeze. Pt left in bed with needs at hand.  Therapy Documentation Precautions:  Precautions Precautions: Fall Precaution Comments: watch O2 Restrictions Weight Bearing Restrictions: Yes LLE Weight Bearing: Weight bearing as tolerated Pain: Pain Assessment Pain Scale: 0-10 Pain Score: 7  Pain Type: Surgical pain Pain Location: Leg Pain Orientation: Left Pain Descriptors / Indicators: Aching Pain Frequency: Intermittent Pain Onset: With Activity Pain Intervention(s): rest, ice after session    Therapy/Group: Individual Therapy  Kinslie Hove 04/20/2019, 9:27 AM

## 2019-04-20 NOTE — Progress Notes (Signed)
[facility] PHYSICAL MEDICINE & REHABILITATION PROGRESS NOTE  Subjective/Complaints: Patient seen sitting up in bed this morning.  He states he slept well overnight.  Discussed QTC with patient and patient states that she really wants something for depression because she is experienced depressive episodes that she states "come out of nowhere".  ROS: Denies CP, shortness of breath, nausea, vomiting, diarrhea.  Objective: Vital Signs: Blood pressure (!) 118/53, pulse 68, temperature 98 F (36.7 C), temperature source Oral, resp. rate 17, height 5\' 4"  (1.626 m), weight 58.2 kg, SpO2 92 %. No results found. Recent Labs    04/20/19 0445  WBC 5.2  HGB 10.2*  HCT 31.0*  PLT 277   Recent Labs    04/18/19 1105 04/20/19 0445  NA 141 141  K 3.4* 4.3  CL 106 106  CO2 26 26  GLUCOSE 102* 88  BUN 19 20  CREATININE 0.77 0.74  CALCIUM 8.8* 8.8*    Physical Exam: BP (!) 118/53 (BP Location: Left Arm)   Pulse 68   Temp 98 F (36.7 C) (Oral)   Resp 17   Ht 5\' 4"  (1.626 m)   Wt 58.2 kg   LMP  (LMP Unknown)   SpO2 92%   BMI 22.04 kg/m  Constitutional: No distress . Vital signs reviewed. HENT: Normocephalic.  Atraumatic. Eyes: EOMI.  No discharge. Cardiovascular: No JVD. Respiratory: Normal effort. GI: Non-distended. Musc: Left hip with edema and tenderness, improving Neurological: Alert and oriented Motor: Bilateral upper extremities: 5/5 proximal to distal, stable Right lower extremity: Hip flexion 4/5, knee extension 4+/5, ankle dorsiflexion 5/5, unchanged Left lower extremity: Hip flexion 4-/5, knee extension 4/5, and dorsiflexion 5/5, unchanged Skin:  Incision with dressing C/D/I Psychiatric: She has a normal mood and affect. Her behavior is normal.   Assessment/Plan: 1. Functional deficits secondary to left hip fracture status post left THA  which require 3+ hours per day of interdisciplinary therapy in a comprehensive inpatient rehab setting.  Physiatrist is providing  close team supervision and 24 hour management of active medical problems listed below.  Physiatrist and rehab team continue to assess barriers to discharge/monitor patient progress toward functional and medical goals  Care Tool:  Bathing    Body parts bathed by patient: Right arm, Left arm, Chest, Abdomen, Front perineal area, Buttocks, Right upper leg, Left upper leg, Right lower leg, Left lower leg, Face         Bathing assist Assist Level: Minimal Assistance - Patient > 75%     Upper Body Dressing/Undressing Upper body dressing   What is the patient wearing?: Bra, Pull over shirt    Upper body assist Assist Level: Independent    Lower Body Dressing/Undressing Lower body dressing      What is the patient wearing?: Underwear/pull up, Pants     Lower body assist Assist for lower body dressing: Minimal Assistance - Patient > 75%     Toileting Toileting    Toileting assist Assist for toileting: Contact Guard/Touching assist     Transfers Chair/bed transfer  Transfers assist     Chair/bed transfer assist level: Supervision/Verbal cueing     Locomotion Ambulation   Ambulation assist      Assist level: Contact Guard/Touching assist Assistive device: Walker-rolling Max distance: 40   Walk 10 feet activity   Assist     Assist level: Contact Guard/Touching assist Assistive device: Walker-rolling   Walk 50 feet activity   Assist Walk 50 feet with 2 turns activity did not occur: Safety/medical  concerns  Assist level: Contact Guard/Touching assist Assistive device: Walker-rolling    Walk 150 feet activity   Assist Walk 150 feet activity did not occur: Safety/medical concerns         Walk 10 feet on uneven surface  activity   Assist Walk 10 feet on uneven surfaces activity did not occur: Safety/medical concerns         Wheelchair     Assist Will patient use wheelchair at discharge?: No Type of Wheelchair: Manual     Wheelchair assist level: Supervision/Verbal cueing Max wheelchair distance: 67'    Wheelchair 50 feet with 2 turns activity    Assist        Assist Level: Supervision/Verbal cueing   Wheelchair 150 feet activity     Assist Wheelchair 150 feet activity did not occur: Safety/medical concerns          Medical Problem List and Plan: 1.  Deficits with mobility, transfers, self-care secondary to left hip fracture status post left THA on 04/10/2019.  Continue CIR 2.  Antithrombotics: -DVT/anticoagulation:  Pharmaceutical: Lovenox             -antiplatelet therapy: ASA for DVT prophylaxis after discharge 3. Pain Management: Tramadol d/ced due to Qtc  Controlled on 7/10 4. Mood: LCSW to follow for evaluation and support             -antipsychotic agents: N/A 5. Neuropsych: This patient is capable of making decisions on her own behalf.   Patient adamant she would like to be on something for depression due to her history and previous episodes that significantly impact and limit her.  Discussed risks.  Celexa 20 daily d/ced given Qtc, restarted per patient request, d/ced on 7/11 do to prolonged Qtc.  Discussed with pharmacy, transition to sertraline (potentially less risk, will have to monitor ECG for patient response) 12.5 started on 7/11 6. Skin/Wound Care: Monitor wound for healing.  Nutritional supplements added. 7. Fluids/Electrolytes/Nutrition: Monitor I/O.   8. Hypoxic respiratory failure: COPD per films--encourage IS.  As needed inhaler ordered  Weaned supplemental oxygen. 9. ABLA: Status post transfusion on 04/13/2019.  Continue iron supplement.    Transfused 2 units PRBC on 7/3  Hemoglobin 10.2 on 7/10 10.  Blood pressure:   Controlled on 7/10  Monitor with increased mobility 11. H/o lymphoma: To hold Revlimid for now.  Follow-up with heme-onc for resumption after discharge 12. Constipation: Continue bowel regimen.  Improving 13.  Hyperglycemia  Controlled on  7/10  Continue to monitor 14. Hypokalemia  Potassium 4.3 on 7/10, labs ordered for Monday   Supplement initiated on 7/7, increased on 7/8  Magnesium 1.8   Supplemented IV on 7/7 15.  Prolonged Qtc  ECG reviewed, suggesting prolonged Qtc  Repeat ECG personally reviewed, remains elevated.  Plan to repeat ECG next week  See #5  LOS: 6 days A FACE TO FACE EVALUATION WAS PERFORMED  Isamu Trammel Lorie Phenix 04/20/2019, 11:25 AM

## 2019-04-20 NOTE — Progress Notes (Signed)
Occupational Therapy Session Note  Patient Details  Name: Brooke Washington MRN: [id number] Date of Birth: 1941/06/20  Today's Date: 04/20/2019 OT Individual Time: 1100-1155 OT Individual Time Calculation (min): 55 min    Short Term Goals: Week 1:  OT Short Term Goal 1 (Week 1): Pt will thread BLE through pants with CGA using LRAD OT Short Term Goal 2 (Week 1): Pt will transfer to [facility] over toilet with min A OT Short Term Goal 3 (Week 1): Pt will complete tub transfer with TTB with min A  Skilled Therapeutic Interventions/Progress Updates:    Patient in bed, asleep, easy to arouse and happy to participate in therapy session. Pain as noted below, ice pack provided during seated activities. Supine to SSP with CS, using leg lifter.  Sit to stand and ambulation with RW to/from toilet, arm chair, bed, w/c with CS. toileting with CS, grooming at sink with CS in stance.  She is able to walk to/from therapy gym with CS using RW, cues for posture.  Seated and standing UB conditioning activities and trunk/LE stretches - reviewed safe positions to protect hip replacement.  She remained sitting in w/c at close of session with call bell, tray table and phone in reach.     Therapy Documentation Precautions:  Precautions Precautions: Fall Precaution Comments: watch O2 Restrictions Weight Bearing Restrictions: Yes LLE Weight Bearing: Weight bearing as tolerated General:   Vital Signs: Oxygen Therapy SpO2: 92 % O2 Device: Room Air Pain: Pain Assessment Pain Scale: 0-10 Pain Score: 4  Pain Type: Surgical pain Pain Location: Hip Pain Orientation: Left Pain Descriptors / Indicators: Aching;Tender Pain Frequency: Intermittent Pain Onset: With Activity Pain Intervention(s): Repositioned Other Treatments:     Therapy/Group: Individual Therapy  Carlos Levering 04/20/2019, 12:02 PM

## 2019-04-20 NOTE — Progress Notes (Signed)
Physical Therapy Session Note  Patient Details  Name: Brooke Washington MRN: [id number] Date of Birth: 21-Mar-1941  Today's Date: 04/20/2019 PT Individual Time: 8270-7867 PT Individual Time Calculation (min): 75 min   Short Term Goals: Week 1:  PT Short Term Goal 1 (Week 1): Patient to perform bed mobility min A. PT Short Term Goal 2 (Week 1): Patient to demonstrate standing balance with 1 UE support with S for functional task. PT Short Term Goal 3 (Week 1): Patient to ambulate 8' with min A with RW. PT Short Term Goal 4 (Week 1): Patient to negotiate 1 step with RW and CGA.  Skilled Therapeutic Interventions/Progress Updates:  Pt received in bed & agreeable to tx. Pt transfers to sitting EOB with bed rails, HOB elevated & leg lifter. Pt requires elevated EOB for sit>stand with supervision. Pt reports need to use restroom and ambulates in room/bathroom with RW & close supervision<>CGA. Pt performs toilet transfer with supervision & with continent void on toilet. Pt stands & performs hand hygiene with supervision for balance. Pt ambulates room>gym with RW & supervision with pt demonstrating forward flexed posture, excessive B hip flexion 2/2 toe drag BLE as pt with absent dorsiflexion even to neutral with pt reporting this is her gait following chemotherapy ~1 year ago. Trialed foot-up braces with slight improvement noted but pt still with compensatory gait pattern and impaired foot clearance but not as excessive hip flexion. Pt with very minimal ankle ROM, with difficulty achieving neutral. Pt stood on wedge while engaging in game of checkers x 5 minutes with focus on heel cord stretching but pt continues to flex trunk and hold on with BUE as pt unable to shift COG anteriorly and stand upright 2/2 tight heel cords & impaired balance/COG. Pt utilizes nu-step on level 3 x 8 minutes with all four extremities with task focusing on global strengthening & L Hip ROM, as well as to help alleviate L hip  soreness. Back in room pt transfers sit>supine with use of leg lifter & supervision. Assisted pt with doffing pants per RN request as vascular is coming to see pt. Pt was able to bridge but not fully clear buttocks from bed. Pt left in bed with alarm set & all needs at hand.  Therapy Documentation Precautions:  Precautions Precautions: Fall Precaution Comments: watch O2 Restrictions Weight Bearing Restrictions: Yes LLE Weight Bearing: Weight bearing as tolerated  Pain: Pt reports 7/10 pain in LLE & meds requested & administered during session, & rest breaks provided PRN.      Therapy/Group: Individual Therapy  Waunita Schooner 04/20/2019, 3:45 PM

## 2019-04-21 ENCOUNTER — Inpatient Hospital Stay (HOSPITAL_COMMUNITY): Payer: Medicare Other | Admitting: Physical Therapy

## 2019-04-21 DIAGNOSIS — F3340 Major depressive disorder, recurrent, in remission, unspecified: Secondary | ICD-10-CM

## 2019-04-21 NOTE — Progress Notes (Addendum)
Physical Therapy Session Note  Patient Details  Name: Brooke Washington MRN: [id number] Date of Birth: 11/24/1940  Today's Date: 04/21/2019 PT Individual Time: 0908-1004 PT Individual Time Calculation (min): 56 min   Short Term Goals: Week 1:  PT Short Term Goal 1 (Week 1): Patient to perform bed mobility min A. PT Short Term Goal 2 (Week 1): Patient to demonstrate standing balance with 1 UE support with S for functional task. PT Short Term Goal 3 (Week 1): Patient to ambulate 11' with min A with RW. PT Short Term Goal 4 (Week 1): Patient to negotiate 1 step with RW and CGA.  Skilled Therapeutic Interventions/Progress Updates:  Pt received in bed & agreeable to tx. Pt reports her bed at home as an adjustable HOB but no rails so pt completes bed mobility with HOB elevated, leg lifter & mod I to transition to sitting EOB. Pt transfers sit>stand and ambulates in room with RW & supervision. Pt brushes teeth standing at sink with close supervision except min assist 2/2 1 LOB.  Pt ambulates room>gym with RW & supervision with max cuing for upright posture and decrease reliance on RW with some intermittent improvement and when pt does so, she demonstrates improvement in gait pattern with slightly more normalized hip/knee flexion and less toe drag but pt then reverts to forward flexed posture, toe drag, excessive hip/knee flexion (R>L) and downward gaze. In gym, therapist provides instructional cuing & demonstration for 3" step negotiation with RW with pt return demonstrating with close supervision and max cuing for compensatory pattern. Pt reports she feels comfortable with negotiating step if she can remember pattern; continued to review with pt during session. Pt performed BLE hip/knee flexion standing exercises with supervision for standing balance & BUE support on RW. Pt with minimal LLE hip ROM 2/2 pain. Pt ambulates back towards room ~40 ft before having to sit 2/2 pain. At end of session pt left  sitting in w/c in room with chair alarm donned & all needs at hand.   Therapy Documentation Precautions:  Precautions Precautions: Fall Precaution Comments: watch O2 Restrictions Weight Bearing Restrictions: Yes LLE Weight Bearing: Weight bearing as tolerated  Pain: Pt reports 8/10 soreness in L hip with movement - meds requested & seated rest breaks provided PRN. Provided pt with ice at end of session & educated pt to remove it in ~15 minutes.     Therapy/Group: Individual Therapy  Waunita Schooner 04/21/2019, 10:10 AM

## 2019-04-21 NOTE — Plan of Care (Signed)
  Problem: Consults Goal: RH GENERAL PATIENT EDUCATION Description: See Patient Education module for education specifics. Outcome: Progressing Goal: Nutrition Consult-if indicated Outcome: Progressing   Problem: RH BOWEL ELIMINATION Goal: RH STG MANAGE BOWEL WITH ASSISTANCE Description: STG Manage Bowel with min Assistance. Outcome: Progressing Goal: RH STG MANAGE BOWEL W/MEDICATION W/ASSISTANCE Description: STG Manage Bowel with Medication with min Assistance. Outcome: Progressing   Problem: RH BLADDER ELIMINATION Goal: RH STG MANAGE BLADDER WITH ASSISTANCE Description: STG Manage Bladder With min Assistance Outcome: Progressing   Problem: RH SKIN INTEGRITY Goal: RH STG SKIN FREE OF INFECTION/BREAKDOWN Outcome: Progressing Goal: RH STG ABLE TO PERFORM INCISION/WOUND CARE W/ASSISTANCE Description: STG Able To Perform Incision/Wound Care With min Assistance. Outcome: Progressing   Problem: RH SAFETY Goal: RH STG ADHERE TO SAFETY PRECAUTIONS W/ASSISTANCE/DEVICE Description: STG Adhere to Safety Precautions With mod I Assistance/Device. Outcome: Progressing   Problem: RH PAIN MANAGEMENT Goal: RH STG PAIN MANAGED AT OR BELOW PT'S PAIN GOAL Description: Less than 3 out of 10 Outcome: Progressing

## 2019-04-21 NOTE — Progress Notes (Signed)
[place] PHYSICAL MEDICINE & REHABILITATION PROGRESS NOTE  Subjective/Complaints: Patient seen sitting up in bed this morning.  She states she slept well overnight.  She states she is sore from therapies yesterday.  Discussed antidepressant medication again.  ROS: Denies CP, shortness of breath, nausea, vomiting, diarrhea.  Objective: Vital Signs: Blood pressure 131/67, pulse 90, temperature 98.4 F (36.9 C), temperature source Oral, resp. rate 16, height 5\' 4"  (1.626 m), weight 58.2 kg, SpO2 91 %. No results found. Recent Labs    04/20/19 0445  WBC 5.2  HGB 10.2*  HCT 31.0*  PLT 277   Recent Labs    04/18/19 1105 04/20/19 0445  NA 141 141  K 3.4* 4.3  CL 106 106  CO2 26 26  GLUCOSE 102* 88  BUN 19 20  CREATININE 0.77 0.74  CALCIUM 8.8* 8.8*    Physical Exam: BP 131/67 (BP Location: Left Arm)   Pulse 90   Temp 98.4 F (36.9 C) (Oral)   Resp 16   Ht 5\' 4"  (1.626 m)   Wt 58.2 kg   LMP  (LMP Unknown)   SpO2 91%   BMI 22.04 kg/m  Constitutional: No distress . Vital signs reviewed. HENT: Normocephalic.  Atraumatic. Eyes: EOMI.  No discharge. Cardiovascular: No JVD. Respiratory: Normal effort. GI: Non--distended. Musc: Left hip with edema and tenderness, unchanged Neurological: Alert and oriented Motor: Bilateral upper extremities: 5/5 proximal to distal, stable Right lower extremity: Hip flexion 4/5, knee extension 4+/5, ankle dorsiflexion 5/5, unchanged Left lower extremity: Hip flexion 4-/5, knee extension 4/5, and dorsiflexion 5/5, improving Skin:  Incision with dressing C/D/I Psychiatric: She has a normal mood and affect. Her behavior is normal.   Assessment/Plan: 1. Functional deficits secondary to left hip fracture status post left THA  which require 3+ hours per day of interdisciplinary therapy in a comprehensive inpatient rehab setting.  Physiatrist is providing close team supervision and 24 hour management of active medical problems listed  below.  Physiatrist and rehab team continue to assess barriers to discharge/monitor patient progress toward functional and medical goals  Care Tool:  Bathing    Body parts bathed by patient: Right arm, Left arm, Chest, Abdomen, Front perineal area, Buttocks, Right upper leg, Left upper leg, Right lower leg, Left lower leg, Face         Bathing assist Assist Level: Minimal Assistance - Patient > 75%     Upper Body Dressing/Undressing Upper body dressing   What is the patient wearing?: Bra, Pull over shirt    Upper body assist Assist Level: Independent    Lower Body Dressing/Undressing Lower body dressing      What is the patient wearing?: Underwear/pull up, Pants     Lower body assist Assist for lower body dressing: Minimal Assistance - Patient > 75%     Toileting Toileting    Toileting assist Assist for toileting: Supervision/Verbal cueing     Transfers Chair/bed transfer  Transfers assist     Chair/bed transfer assist level: Supervision/Verbal cueing(RW)     Locomotion Ambulation   Ambulation assist      Assist level: Contact Guard/Touching assist Assistive device: Walker-rolling Max distance: 150 ft   Walk 10 feet activity   Assist     Assist level: Contact Guard/Touching assist Assistive device: Walker-rolling   Walk 50 feet activity   Assist Walk 50 feet with 2 turns activity did not occur: Safety/medical concerns  Assist level: Contact Guard/Touching assist Assistive device: Walker-rolling    Walk 150 feet  activity   Assist Walk 150 feet activity did not occur: Safety/medical concerns  Assist level: Contact Guard/Touching assist Assistive device: Walker-rolling    Walk 10 feet on uneven surface  activity   Assist Walk 10 feet on uneven surfaces activity did not occur: Safety/medical concerns         Wheelchair     Assist Will patient use wheelchair at discharge?: No Type of Wheelchair: Manual    Wheelchair  assist level: Supervision/Verbal cueing Max wheelchair distance: 57'    Wheelchair 50 feet with 2 turns activity    Assist        Assist Level: Supervision/Verbal cueing   Wheelchair 150 feet activity     Assist Wheelchair 150 feet activity did not occur: Safety/medical concerns          Medical Problem List and Plan: 1.  Deficits with mobility, transfers, self-care secondary to left hip fracture status post left THA on 04/10/2019.  Continue CIR 2.  Antithrombotics: -DVT/anticoagulation:  Pharmaceutical: Lovenox             -antiplatelet therapy: ASA for DVT prophylaxis after discharge 3. Pain Management: Tramadol d/ced due to Qtc  Controlled on 7/11 4. Mood: LCSW to follow for evaluation and support             -antipsychotic agents: N/A 5. Neuropsych: This patient is capable of making decisions on her own behalf.   Patient adamant she would like to be on something for depression due to her history and previous episodes that significantly impact and limit her.  Discussed risks.  Celexa 20 daily d/ced given Qtc, restarted per patient request, d/ced on 7/11 do to prolonged Qtc.  Discussed with pharmacy, transition to sertraline (potentially less risk, will have to monitor ECG for patient response) 12.5 started on 7/11 6. Skin/Wound Care: Monitor wound for healing.  Nutritional supplements added. 7. Fluids/Electrolytes/Nutrition: Monitor I/O.   8. Hypoxic respiratory failure: COPD per films--encourage IS.  As needed inhaler ordered  Weaned supplemental oxygen. 9. ABLA: Status post transfusion on 04/13/2019.  Continue iron supplement.    Transfused 2 units PRBC on 7/3  Hemoglobin 10.2 on 7/10, labs ordered for Monday 10.  Blood pressure:   Controlled on 7/11  Monitor with increased mobility 11. H/o lymphoma: To hold Revlimid for now.  Follow-up with heme-onc for resumption after discharge 12. Constipation: Continue bowel regimen.  Improving 13.   Hyperglycemia  Controlled on 7/10  Continue to monitor 14. Hypokalemia  Potassium 4.3 on 7/10, labs ordered for Monday   Supplement initiated on 7/7, increased on 7/8  Magnesium 1.8   Supplemented IV on 7/7 15.  Prolonged Qtc  ECG reviewed, suggesting prolonged Qtc  Repeat ECG personally reviewed, remains elevated.  Plan to repeat ECG on Monday  See #5  LOS: 7 days A FACE TO FACE EVALUATION WAS PERFORMED  Brodin Gelpi Lorie Phenix 04/21/2019, 9:29 AM

## 2019-04-22 ENCOUNTER — Inpatient Hospital Stay (HOSPITAL_COMMUNITY): Payer: Medicare Other

## 2019-04-22 LAB — URINALYSIS, ROUTINE W REFLEX MICROSCOPIC
Bilirubin Urine: NEGATIVE
Glucose, UA: 50 mg/dL — AB
Ketones, ur: NEGATIVE mg/dL
Nitrite: POSITIVE — AB
Protein, ur: 30 mg/dL — AB
Specific Gravity, Urine: 1.012 (ref 1.005–1.030)
WBC, UA: >50 WBC/hpf — ABNORMAL HIGH (ref 0–5)
pH: 5 (ref 5.0–8.0)

## 2019-04-22 NOTE — Plan of Care (Signed)
  Problem: Consults Goal: RH GENERAL PATIENT EDUCATION Description: See Patient Education module for education specifics. Outcome: Progressing Goal: Nutrition Consult-if indicated Outcome: Progressing   Problem: RH BOWEL ELIMINATION Goal: RH STG MANAGE BOWEL WITH ASSISTANCE Description: STG Manage Bowel with min Assistance. Outcome: Progressing Goal: RH STG MANAGE BOWEL W/MEDICATION W/ASSISTANCE Description: STG Manage Bowel with Medication with min Assistance. Outcome: Progressing   Problem: RH BLADDER ELIMINATION Goal: RH STG MANAGE BLADDER WITH ASSISTANCE Description: STG Manage Bladder With min Assistance Outcome: Progressing   Problem: RH SKIN INTEGRITY Goal: RH STG SKIN FREE OF INFECTION/BREAKDOWN Outcome: Progressing Goal: RH STG ABLE TO PERFORM INCISION/WOUND CARE W/ASSISTANCE Description: STG Able To Perform Incision/Wound Care With min Assistance. Outcome: Progressing   Problem: RH SAFETY Goal: RH STG ADHERE TO SAFETY PRECAUTIONS W/ASSISTANCE/DEVICE Description: STG Adhere to Safety Precautions With mod I Assistance/Device. Outcome: Progressing   Problem: RH PAIN MANAGEMENT Goal: RH STG PAIN MANAGED AT OR BELOW PT'S PAIN GOAL Description: Less than 3 out of 10 Outcome: Progressing

## 2019-04-22 NOTE — Progress Notes (Signed)
[facility] PHYSICAL MEDICINE & REHABILITATION PROGRESS NOTE  Subjective/Complaints: Patient seen sitting up in bed this morning.  She states she slept well overnight.  She states "feels like I am getting cystitis".  Patient has personal history.  ROS: +Bladder discomfort.  Denies CP, shortness of breath, nausea, vomiting, diarrhea.  Objective: Vital Signs: Blood pressure 128/69, pulse 88, temperature 98.5 F (36.9 C), temperature source Oral, resp. rate 18, height 5\' 4"  (1.626 m), weight 58.2 kg, SpO2 94 %. No results found. Recent Labs    04/20/19 0445  WBC 5.2  HGB 10.2*  HCT 31.0*  PLT 277   Recent Labs    04/20/19 0445  NA 141  K 4.3  CL 106  CO2 26  GLUCOSE 88  BUN 20  CREATININE 0.74  CALCIUM 8.8*    Physical Exam: BP 128/69 (BP Location: Left Arm)   Pulse 88   Temp 98.5 F (36.9 C) (Oral)   Resp 18   Ht 5\' 4"  (1.626 m)   Wt 58.2 kg   LMP  (LMP Unknown)   SpO2 94%   BMI 22.04 kg/m  Constitutional: No distress . Vital signs reviewed. HENT: Normocephalic.  Atraumatic. Eyes: EOMI.  No discharge. Cardiovascular: No JVD. Respiratory: Normal effort. GI: Non-distended. Musc: Left hip with edema and tenderness, stable Neurological: Alert and oriented Motor: Bilateral upper extremities: 5/5 proximal to distal, stable Right lower extremity: Hip flexion 4/5, knee extension 4+/5, ankle dorsiflexion 5/5, stable Left lower extremity: Hip flexion 4-/5, knee extension 4/5, and dorsiflexion 5/5, stable Skin:  Incision with dressing C/D/I Psychiatric: She has a normal mood and affect. Her behavior is normal.   Assessment/Plan: 1. Functional deficits secondary to left hip fracture status post left THA  which require 3+ hours per day of interdisciplinary therapy in a comprehensive inpatient rehab setting.  Physiatrist is providing close team supervision and 24 hour management of active medical problems listed below.  Physiatrist and rehab team continue to assess  barriers to discharge/monitor patient progress toward functional and medical goals  Care Tool:  Bathing    Body parts bathed by patient: Right arm, Left arm, Chest, Abdomen, Front perineal area, Buttocks, Right upper leg, Left upper leg, Right lower leg, Left lower leg, Face         Bathing assist Assist Level: Minimal Assistance - Patient > 75%     Upper Body Dressing/Undressing Upper body dressing   What is the patient wearing?: Bra, Pull over shirt    Upper body assist Assist Level: Independent    Lower Body Dressing/Undressing Lower body dressing      What is the patient wearing?: Underwear/pull up, Pants     Lower body assist Assist for lower body dressing: Minimal Assistance - Patient > 75%     Toileting Toileting    Toileting assist Assist for toileting: Supervision/Verbal cueing     Transfers Chair/bed transfer  Transfers assist     Chair/bed transfer assist level: Supervision/Verbal cueing     Locomotion Ambulation   Ambulation assist      Assist level: Supervision/Verbal cueing Assistive device: Walker-rolling Max distance: 150 ft   Walk 10 feet activity   Assist     Assist level: Supervision/Verbal cueing Assistive device: Walker-rolling   Walk 50 feet activity   Assist Walk 50 feet with 2 turns activity did not occur: Safety/medical concerns  Assist level: Supervision/Verbal cueing Assistive device: Walker-rolling    Walk 150 feet activity   Assist Walk 150 feet activity did not  occur: Safety/medical concerns  Assist level: Supervision/Verbal cueing Assistive device: Walker-rolling    Walk 10 feet on uneven surface  activity   Assist Walk 10 feet on uneven surfaces activity did not occur: Safety/medical concerns         Wheelchair     Assist Will patient use wheelchair at discharge?: No Type of Wheelchair: Manual    Wheelchair assist level: Supervision/Verbal cueing Max wheelchair distance: 62'     Wheelchair 50 feet with 2 turns activity    Assist        Assist Level: Supervision/Verbal cueing   Wheelchair 150 feet activity     Assist Wheelchair 150 feet activity did not occur: Safety/medical concerns          Medical Problem List and Plan: 1.  Deficits with mobility, transfers, self-care secondary to left hip fracture status post left THA on 04/10/2019.  Continue CIR 2.  Antithrombotics: -DVT/anticoagulation:  Pharmaceutical: Lovenox             -antiplatelet therapy: ASA for DVT prophylaxis after discharge 3. Pain Management: Tramadol d/ced due to Qtc  Controlled on 7/12 4. Mood: LCSW to follow for evaluation and support             -antipsychotic agents: N/A 5. Neuropsych: This patient is capable of making decisions on her own behalf.   Patient adamant she would like to be on something for depression due to her history and previous episodes that significantly impact and limit her.  Discussed risks.  Celexa 20 daily d/ced given Qtc, restarted per patient request, d/ced on 7/11 do to prolonged Qtc.  Discussed with pharmacy, transition to sertraline (potentially less risk, will have to monitor ECG for patient response) 12.5 started on 7/11.  ECG ordered for tomorrow. 6. Skin/Wound Care: Monitor wound for healing.  Nutritional supplements added. 7. Fluids/Electrolytes/Nutrition: Monitor I/O.   8. Hypoxic respiratory failure: COPD per films--encourage IS.  As needed inhaler ordered  Weaned supplemental oxygen. 9. ABLA: Status post transfusion on 04/13/2019.  Continue iron supplement.    Transfused 2 units PRBC on 7/3  Hemoglobin 10.2 on 7/10, labs ordered for tomorrow 10.  Blood pressure:   Controlled on 7/12  Monitor with increased mobility 11. H/o lymphoma: To hold Revlimid for now.  Follow-up with heme-onc for resumption after discharge 12. Constipation: Continue bowel regimen.  Improving 13.  Hyperglycemia  Controlled on 7/10  Continue to monitor 14.  Hypokalemia  Potassium 4.3 on 7/10, labs ordered for tomorrow   Supplement initiated on 7/7, increased on 7/8  Magnesium 1.8   Supplemented IV on 7/7 15.  Prolonged Qtc  ECG reviewed, suggesting prolonged Qtc  Repeat ECG personally reviewed, remains elevated.  Repeat ECG ordered for tomorrow.  See #5 16.  Bladder discomfort  UA/culture ordered.  LOS: 8 days A FACE TO FACE EVALUATION WAS PERFORMED  Ankit Lorie Phenix 04/22/2019, 9:25 AM

## 2019-04-22 NOTE — Progress Notes (Signed)
Occupational Therapy Weekly Progress Note  Patient Details  Name: Brooke Washington MRN: [id number] Date of Birth: [birth year]  Beginning of progress report period: April 15, 2019 End of progress report period: April 22, 2019  Today's Date: 04/22/2019 OT Individual Time: 0850-1005 OT Individual Time Calculation (min): 75 min    Patient has met 3 of 3 short term goals.  Pt has made great gains toward her OT POC this reporting period. She has made progress in her ADL transfers, bathing and dressing tasks, and in pain management. Pt remains motivated to return home and to her PLOF.   Patient continues to demonstrate the following deficits: muscle weakness, joint tightness, and decreased standing balance and decreased balance strategies and therefore will continue to benefit from skilled OT intervention to enhance overall performance with BADL.  Patient progressing toward long term goals..  Continue plan of care.  OT Short Term Goals Week 1:  OT Short Term Goal 1 (Week 1): Pt will thread BLE through pants with CGA using LRAD OT Short Term Goal 1 - Progress (Week 1): Met OT Short Term Goal 2 (Week 1): Pt will transfer to [facility] over toilet with min A OT Short Term Goal 2 - Progress (Week 1): Met OT Short Term Goal 3 (Week 1): Pt will complete tub transfer with TTB with min A OT Short Term Goal 3 - Progress (Week 1): Met Week 2:  OT Short Term Goal 1 (Week 2): STG= LTG d/t ELOS  Skilled Therapeutic Interventions/Progress Updates:    Pt received supine requesting to wash hair. Pt completed bed mobility to EOB without leg lifter with (S). Pt completed ambulatory transfer to sink with RW, CGA. Using hair washing tray pt backed up to sink and completed hair washing with assistance provided to manage equipment. Pt used blow dryer to dry hair, using BUE with set up assist. Pt completed LB bathing, requring min A for reaching L foot. Pt donned shirt and washed UB with set up assist. Pt completed ambulatory  transfer into bathroom with CGA using RW. Pt voided urine and completed all toileting tasks with (S). Pt then completed 50 ft of functional mobility with RW before reporting the urge to use the bathroom began again. Pt returned to her room and completed toileting tasks again with close (S). Cueing for environment set up at home. Pt returned to sitting EOB and was left with all needs met, bed alarm set.   Therapy Documentation Precautions:  Precautions Precautions: Fall Precaution Comments: watch O2 Restrictions Weight Bearing Restrictions: Yes LLE Weight Bearing: Weight bearing as tolerated   Therapy/Group: Individual Therapy  Curtis Sites 04/22/2019, 9:17 AM

## 2019-04-22 NOTE — Progress Notes (Signed)
Slept good. Reports pain with therapy and when getting OOB. Denies need for pain med. Left hip foam dressing, edema noted to LLE. Bladder scan after void=139cc's. Brooke Washington A

## 2019-04-23 ENCOUNTER — Other Ambulatory Visit: Payer: Self-pay

## 2019-04-23 ENCOUNTER — Inpatient Hospital Stay (HOSPITAL_COMMUNITY): Payer: Medicare Other | Admitting: Physical Therapy

## 2019-04-23 ENCOUNTER — Inpatient Hospital Stay (HOSPITAL_COMMUNITY): Payer: Medicare Other

## 2019-04-23 DIAGNOSIS — A499 Bacterial infection, unspecified: Secondary | ICD-10-CM

## 2019-04-23 DIAGNOSIS — N390 Urinary tract infection, site not specified: Secondary | ICD-10-CM

## 2019-04-23 LAB — BASIC METABOLIC PANEL
Anion gap: 10 (ref 5–15)
BUN: 28 mg/dL — ABNORMAL HIGH (ref 8–23)
CO2: 25 mmol/L (ref 22–32)
Calcium: 9.4 mg/dL (ref 8.9–10.3)
Chloride: 102 mmol/L (ref 98–111)
Creatinine, Ser: 0.75 mg/dL (ref 0.44–1.00)
GFR calc Af Amer: >60 mL/min (ref >60)
GFR calc non Af Amer: >60 mL/min (ref >60)
Glucose, Bld: 93 mg/dL (ref 70–99)
Potassium: 4.4 mmol/L (ref 3.5–5.1)
Sodium: 137 mmol/L (ref 135–145)

## 2019-04-23 LAB — CBC WITH DIFFERENTIAL/PLATELET
Abs Immature Granulocytes: 0.07 10*3/uL (ref 0.00–0.07)
Basophils Absolute: 0.1 10*3/uL (ref 0.0–0.1)
Basophils Relative: 1 %
Eosinophils Absolute: 0.4 10*3/uL (ref 0.0–0.5)
Eosinophils Relative: 5 %
HCT: 36.9 % (ref 36.0–46.0)
Hemoglobin: 12 g/dL (ref 12.0–15.0)
Immature Granulocytes: 1 %
Lymphocytes Relative: 12 %
Lymphs Abs: 1 10*3/uL (ref 0.7–4.0)
MCH: 32.6 pg (ref 26.0–34.0)
MCHC: 32.5 g/dL (ref 30.0–36.0)
MCV: 100.3 fL — ABNORMAL HIGH (ref 80.0–100.0)
Monocytes Absolute: 0.8 10*3/uL (ref 0.1–1.0)
Monocytes Relative: 10 %
Neutro Abs: 5.6 10*3/uL (ref 1.7–7.7)
Neutrophils Relative %: 71 %
Platelets: 351 10*3/uL (ref 150–400)
RBC: 3.68 MIL/uL — ABNORMAL LOW (ref 3.87–5.11)
RDW: 15.9 % — ABNORMAL HIGH (ref 11.5–15.5)
WBC: 7.9 10*3/uL (ref 4.0–10.5)
nRBC: 0 % (ref 0.0–0.2)

## 2019-04-23 MED ORDER — SULFAMETHOXAZOLE-TRIMETHOPRIM 800-160 MG PO TABS
1.0000 | ORAL_TABLET | Freq: Two times a day (BID) | ORAL | Status: DC
  Administered 2019-04-23 – 2019-04-24 (×4): 1 via ORAL
  Filled 2019-04-23 (×5): qty 1

## 2019-04-23 NOTE — Progress Notes (Signed)
Occupational Therapy Session Note  Patient Details  Name: NASHYA GARLINGTON MRN: [id number] Date of Birth: 26-Nov-1940  Today's Date: 04/23/2019 OT Individual Time: 0315-1600 OT Individual Time Calculation (min): 45 min    Short Term Goals: Week 1:  OT Short Term Goal 1 (Week 1): Pt will thread BLE through pants with CGA using LRAD OT Short Term Goal 1 - Progress (Week 1): Met OT Short Term Goal 2 (Week 1): Pt will transfer to [facility] over toilet with min A OT Short Term Goal 2 - Progress (Week 1): Met OT Short Term Goal 3 (Week 1): Pt will complete tub transfer with TTB with min A OT Short Term Goal 3 - Progress (Week 1): Met  Skilled Therapeutic Interventions/Progress Updates:    1:1. Pt received in bed iwht "some" pain but premedicated. Pt completes ambulation to dayroom with RW and supervision with VC for keeping Rw closer to body. Pt stands to play 2 games of life sized connect 4 using reacher in between game to obtain pieces off floor to maintain precautions. Pt completes ambulation back to room to clean out duffel bag. Pt requires VC for standing closer to objects reaching for and min A for anterior LOB. Exited session with pt seated in bed, exit alarm on and call light in reach  Therapy Documentation Precautions:  Precautions Precautions: Fall Precaution Comments: watch O2 Restrictions Weight Bearing Restrictions: Yes LLE Weight Bearing: Weight bearing as tolerated General:   Vital Signs: Therapy Vitals Temp: 97.7 F (36.5 C) Temp Source: Oral Pulse Rate: 95 Resp: 18 BP: (!) 98/54 Patient Position (if appropriate): Sitting Oxygen Therapy SpO2: 95 % O2 Device: Room Air Pain: Pain Assessment Pain Scale: 0-10 Pain Score: 0-No pain Pain Intervention(s): Medication (See eMAR)(tylenol and robaaxin given prior to therapy) ADL: ADL Eating: Independent Where Assessed-Eating: Chair Grooming: Supervision/safety Where Assessed-Grooming: Sitting at sink Upper Body Bathing:  Supervision/safety Where Assessed-Upper Body Bathing: Sitting at sink Lower Body Bathing: Moderate assistance, Moderate cueing Where Assessed-Lower Body Bathing: Sitting at sink, Standing at sink Upper Body Dressing: Modified independent (Device) Where Assessed-Upper Body Dressing: Sitting at sink Lower Body Dressing: Moderate cueing, Moderate assistance Where Assessed-Lower Body Dressing: Sitting at sink, Standing at sink Toileting: Moderate assistance, Minimal cueing Where Assessed-Toileting: Bedside Commode, Toilet Toilet Transfer: Moderate assistance Toilet Transfer Method: Stand pivot Toilet Transfer Equipment: Art gallery manager    Praxis   Exercises:   Other Treatments:     Therapy/Group: Individual Therapy  Tonny Branch 04/23/2019, 4:04 PM

## 2019-04-23 NOTE — Plan of Care (Signed)
  Problem: Consults Goal: RH GENERAL PATIENT EDUCATION Description: See Patient Education module for education specifics. Outcome: Progressing Goal: Nutrition Consult-if indicated Outcome: Progressing   Problem: RH BOWEL ELIMINATION Goal: RH STG MANAGE BOWEL WITH ASSISTANCE Description: STG Manage Bowel with min Assistance. Outcome: Progressing Goal: RH STG MANAGE BOWEL W/MEDICATION W/ASSISTANCE Description: STG Manage Bowel with Medication with min Assistance. Outcome: Progressing   Problem: RH BLADDER ELIMINATION Goal: RH STG MANAGE BLADDER WITH ASSISTANCE Description: STG Manage Bladder With min Assistance Outcome: Progressing   Problem: RH SKIN INTEGRITY Goal: RH STG SKIN FREE OF INFECTION/BREAKDOWN Outcome: Progressing Goal: RH STG ABLE TO PERFORM INCISION/WOUND CARE W/ASSISTANCE Description: STG Able To Perform Incision/Wound Care With min Assistance. Outcome: Progressing   Problem: RH SAFETY Goal: RH STG ADHERE TO SAFETY PRECAUTIONS W/ASSISTANCE/DEVICE Description: STG Adhere to Safety Precautions With mod I Assistance/Device. Outcome: Progressing   Problem: RH PAIN MANAGEMENT Goal: RH STG PAIN MANAGED AT OR BELOW PT'S PAIN GOAL Description: Less than 3 out of 10 Outcome: Progressing

## 2019-04-23 NOTE — Progress Notes (Signed)
[facility] PHYSICAL MEDICINE & REHABILITATION PROGRESS NOTE  Subjective/Complaints: Still feels like she has a bladder infection, still irritated when she tries to urinate. Pain controlled.  ROS: Patient denies fever, rash, sore throat, blurred vision, nausea, vomiting, diarrhea, cough, shortness of breath or chest pain, joint or back pain, headache, or mood change.   Objective: Vital Signs: Blood pressure (!) 117/58, pulse 96, temperature 97.8 F (36.6 C), temperature source Oral, resp. rate 14, height 5\' 4"  (1.626 m), weight 58.2 kg, SpO2 92 %. No results found. No results for input(s): WBC, HGB, HCT, PLT in the last 72 hours. Recent Labs    04/23/19 0426  NA 137  K 4.4  CL 102  CO2 25  GLUCOSE 93  BUN 28*  CREATININE 0.75  CALCIUM 9.4    Physical Exam: BP (!) 117/58 (BP Location: Left Arm)   Pulse 96   Temp 97.8 F (36.6 C) (Oral)   Resp 14   Ht 5\' 4"  (1.626 m)   Wt 58.2 kg   LMP  (LMP Unknown)   SpO2 92%   BMI 22.04 kg/m  Constitutional: No distress . Vital signs reviewed. HEENT: EOMI, oral membranes moist Neck: supple Cardiovascular: RRR without murmur. No JVD    Respiratory: CTA Bilaterally without wheezes or rales. Normal effort    GI: BS +, non-tender, non-distended  Musc: left hip Neurological: Alert and oriented Motor: Bilateral upper extremities: 5/5 proximal to distal, stable Right lower extremity: Hip flexion 4/5, knee extension 4+/5, ankle dorsiflexion 5/5, stable Left lower extremity: Hip flexion 4-/5, knee extension 4/5, and dorsiflexion 5/5, stable Skin:  Incision with dressing CDI Psychiatric: pleasant  Assessment/Plan: 1. Functional deficits secondary to left hip fracture status post left THA  which require 3+ hours per day of interdisciplinary therapy in a comprehensive inpatient rehab setting.  Physiatrist is providing close team supervision and 24 hour management of active medical problems listed below.  Physiatrist and rehab team  continue to assess barriers to discharge/monitor patient progress toward functional and medical goals  Care Tool:  Bathing    Body parts bathed by patient: Right arm, Left arm, Chest, Abdomen, Front perineal area, Buttocks, Right upper leg, Left upper leg, Right lower leg, Left lower leg, Face         Bathing assist Assist Level: Minimal Assistance - Patient > 75%     Upper Body Dressing/Undressing Upper body dressing   What is the patient wearing?: Bra, Pull over shirt    Upper body assist Assist Level: Independent    Lower Body Dressing/Undressing Lower body dressing      What is the patient wearing?: Underwear/pull up, Pants     Lower body assist Assist for lower body dressing: Contact Guard/Touching assist     Toileting Toileting    Toileting assist Assist for toileting: Supervision/Verbal cueing     Transfers Chair/bed transfer  Transfers assist     Chair/bed transfer assist level: Supervision/Verbal cueing     Locomotion Ambulation   Ambulation assist      Assist level: Supervision/Verbal cueing Assistive device: Walker-rolling Max distance: 150 ft   Walk 10 feet activity   Assist     Assist level: Supervision/Verbal cueing Assistive device: Walker-rolling   Walk 50 feet activity   Assist Walk 50 feet with 2 turns activity did not occur: Safety/medical concerns  Assist level: Supervision/Verbal cueing Assistive device: Walker-rolling    Walk 150 feet activity   Assist Walk 150 feet activity did not occur: Safety/medical concerns  Assist level: Supervision/Verbal cueing Assistive device: Walker-rolling    Walk 10 feet on uneven surface  activity   Assist Walk 10 feet on uneven surfaces activity did not occur: Safety/medical concerns         Wheelchair     Assist Will patient use wheelchair at discharge?: No Type of Wheelchair: Manual    Wheelchair assist level: Supervision/Verbal cueing Max wheelchair  distance: 44'    Wheelchair 50 feet with 2 turns activity    Assist        Assist Level: Supervision/Verbal cueing   Wheelchair 150 feet activity     Assist Wheelchair 150 feet activity did not occur: Safety/medical concerns          Medical Problem List and Plan: 1.  Deficits with mobility, transfers, self-care secondary to left hip fracture status post left THA on 04/10/2019.  Continue CIR PT, OT SLP 2.  Antithrombotics: -DVT/anticoagulation:  Pharmaceutical: Lovenox             -antiplatelet therapy: ASA for DVT prophylaxis after discharge 3. Pain Management: Tramadol d/ced due to Qtc  Controlled on 7/13 4. Mood: LCSW to follow for evaluation and support             -antipsychotic agents: N/A 5. Neuropsych: This patient is capable of making decisions on her own behalf.   Patient adamant she would like to be on something for depression due to her history and previous episodes that significantly impact and limit her.  Discussed risks.  Celexa 20 daily d/ced given Qtc, restarted per patient request, d/ced on 7/11 do to prolonged Qtc.  Discussed with pharmacy, transition to sertraline (potentially less risk, will have to monitor ECG for patient response) 12.5 started on 7/11.  ECG stable as per below. 6. Skin/Wound Care: Monitor wound for healing.  Nutritional supplements added. 7. Fluids/Electrolytes/Nutrition: BUN trending up  -encourage PO  8. Hypoxic respiratory failure: COPD per films--encourage IS.    9. ABLA: Status post transfusion on 04/13/2019.  Continue iron supplement.    Transfused 2 units PRBC on 7/3  Hemoglobin 10.2 on 7/10, labs ordered for today? 10.  Blood pressure:   Controlled on 7/12  Monitor with increased mobility 11. H/o lymphoma: To hold Revlimid for now.  Follow-up with heme-onc for resumption after discharge 12. Constipation: Continue bowel regimen.  Improving 13.  Hyperglycemia  Controlled on 7/10  Continue to monitor 14.  Hypokalemia  Potassium 4.4 7/13  Magnesium 1.8   Supplemented IV on 7/7 15.  Prolonged Qtc  ECG reviewed, suggesting prolonged Qtc  Repeat ECG reviewed today, QTC 492  See #5 16.  Bladder discomfort  UA+, UCX pending  -begin empiric bactrim today  -QTC unchanged.   LOS: 9 days A FACE TO FACE EVALUATION WAS PERFORMED  Meredith Staggers 04/23/2019, 8:27 AM

## 2019-04-23 NOTE — Progress Notes (Signed)
Nutrition Follow-up  RD working remotely.  DOCUMENTATION CODES:   Not applicable  INTERVENTION:   - Liberalize diet to Regular, verbal with readback order placed per Dr. Naaman Plummer  - Continue Magic cup TID with meals, each supplement provides 290 kcal and 9 grams of protein  - Continue Pro-stat 30 ml po BID, each supplement provides 100 kcal and 15 grams of protein  - Continue MVI with minerals daily  NUTRITION DIAGNOSIS:   Increased nutrient needs related to post-op healing as evidenced by estimated needs.  Ongoing, being addressed via oral nutrition supplements  GOAL:   Patient will meet greater than or equal to 90% of their needs  Progressing  MONITOR:   PO intake, Labs, Diet advancement, Weight trends, Supplement acceptance  REASON FOR ASSESSMENT:   Malnutrition Screening Tool    ASSESSMENT:   78 year old female with history of lymphoma on Revlimid, COPD who presented to ED after a mechanical fall on 6/29. X-ray revealed left transcervical femur fracture and underwent left total hip arthroplasty on 6/30.  Noted target d/c date of 7/20.  Unable to reach pt via phone call to room. PO intake appears improved with 100% meal completions recorded for last 3 meals.  Will continue with current supplement regimen. Discussed liberalizing pt's diet with Dr. Naaman Plummer who agreed. Verbal with readback order placed.  No new weight since 7/08. Reviewed RN edema assessment. Pt with non-pitting edema to LLE.  Meal Completion: 50-100% x last 7 meals  Medications reviewed and include: cholecalciferol, Pro-stat 30 ml BID, K-dur 20 mEq BID  Labs reviewed.  Diet Order:   Diet Order            Diet regular Room service appropriate? Yes; Fluid consistency: Thin  Diet effective now              EDUCATION NEEDS:   No education needs have been identified at this time  Skin:  Skin Assessment: Skin Integrity Issues: Incisions: closed incision to left hip  Last BM:  04/23/19  medium type 6  Height:   Ht Readings from Last 1 Encounters:  04/14/19 5\' 4"  (1.626 m)    Weight:   Wt Readings from Last 1 Encounters:  04/18/19 58.2 kg    Ideal Body Weight:  54.5 kg  BMI:  Body mass index is 22.04 kg/m.  Estimated Nutritional Needs:   Kcal:  1450-1600  Protein:  86-100g  Fluid:  1.6L    Gaynell Face, MS, RD, LDN Inpatient Clinical Dietitian Pager: [phone] Weekend/After Hours: [phone]

## 2019-04-23 NOTE — Progress Notes (Signed)
Foam dressing to left hip CD& I. Left hip edema. Refused prostat last night. Denied pain, except with movement.Brooke Washington A

## 2019-04-23 NOTE — Progress Notes (Signed)
Occupational Therapy Session Note  Patient Details  Name: Brooke Washington MRN: [id number] Date of Birth: 1941/03/18  Today's Date: 04/23/2019 OT Individual Time: 2683-4196 OT Individual Time Calculation (min): 72 min    Short Term Goals: Week 1:  OT Short Term Goal 1 (Week 1): Pt will thread BLE through pants with CGA using LRAD OT Short Term Goal 1 - Progress (Week 1): Met OT Short Term Goal 2 (Week 1): Pt will transfer to [facility] over toilet with min A OT Short Term Goal 2 - Progress (Week 1): Met OT Short Term Goal 3 (Week 1): Pt will complete tub transfer with TTB with min A OT Short Term Goal 3 - Progress (Week 1): Met  Skilled Therapeutic Interventions/Progress Updates:    1:1. P trecieved in bed willling to participate in tx but very concerned with getting antibiotic. RN alerted and delivered during session. Pt ambulates to tx gym with RW and VC for relaxing shoulders, urpight posture and looking forward. Pt completes 5# dowel rod HEP for global strengthening required for BADLs and transfers. Pt completes 3x30 ball tosses for BUE endurance (chest, bounce and overhead pass). Pt completes seated and standing Wii bowling activity with superviison for balance and VC for posture. Exited session with pt seated in w.c, call light in reach and exit alarm on  Therapy Documentation Precautions:  Precautions Precautions: Fall Precaution Comments: watch O2 Restrictions Weight Bearing Restrictions: Yes LLE Weight Bearing: Weight bearing as tolerated General:   Vital Signs: Oxygen Therapy O2 Device: Room Air Pain: Pain Assessment Pain Scale: 0-10 Pain Score: 7  Pain Type: Acute pain Pain Location: Knee Pain Orientation: Left Pain Radiating Towards: to top of leg Pain Descriptors / Indicators: Aching Pain Frequency: Intermittent Pain Onset: Gradual Patients Stated Pain Goal: 2 Pain Intervention(s): Medication (See eMAR) ADL: ADL Eating: Independent Where Assessed-Eating:  Chair Grooming: Supervision/safety Where Assessed-Grooming: Sitting at sink Upper Body Bathing: Supervision/safety Where Assessed-Upper Body Bathing: Sitting at sink Lower Body Bathing: Moderate assistance, Moderate cueing Where Assessed-Lower Body Bathing: Sitting at sink, Standing at sink Upper Body Dressing: Modified independent (Device) Where Assessed-Upper Body Dressing: Sitting at sink Lower Body Dressing: Moderate cueing, Moderate assistance Where Assessed-Lower Body Dressing: Sitting at sink, Standing at sink Toileting: Moderate assistance, Minimal cueing Where Assessed-Toileting: Bedside Commode, Toilet Toilet Transfer: Moderate assistance Toilet Transfer Method: Stand pivot Toilet Transfer Equipment: Art gallery manager    Praxis   Exercises:   Other Treatments:     Therapy/Group: Individual Therapy  Tonny Branch 04/23/2019, 9:59 AM

## 2019-04-23 NOTE — Progress Notes (Addendum)
Physical Therapy Weekly Progress Note  Patient Details  Name: Brooke Washington MRN: [id number] Date of Birth: [birth year]  Beginning of progress report period: April 15, 2019 End of progress report period: April 23, 2019  Today's Date: 04/23/2019 PT Individual Time: 3762-8315 PT Individual Time Calculation (min): 72 min   Patient has met 4 of 4 short term goals.  Pt is making consistent progress towards all LTG's. Pt currently can negotiate single step with RW & supervision to simulate home entrance, and can ambulate with RW & supervision. 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 Pt can complete bed mobility with use of leg lifter & supervision & therapist has educated pt on need to purchase AD if she'd like to use one at home, which pt states she would. Pt would benefit from continued skilled PT treatment to focus on transfers, gait, stair negotiation, balance, and d/c planning.   Patient continues to demonstrate the following deficits muscle weakness and muscle joint tightness, decreased cardiorespiratoy endurance, and decreased standing balance, decreased postural control and decreased balance strategies and therefore will continue to benefit from skilled PT intervention to increase functional independence with mobility.  Patient progressing toward long term goals..  Continue plan of care.  PT Short Term Goals Week 1:  PT Short Term Goal 1 (Week 1): Patient to perform bed mobility min A. PT Short Term Goal 1 - Progress (Week 1): Met PT Short Term Goal 2 (Week 1): Patient to demonstrate standing balance with 1 UE support  with S for functional task. PT Short Term Goal 2 - Progress (Week 1): Met PT Short Term Goal 3 (Week 1): Patient to ambulate 93' with min A with RW. PT Short Term Goal 3 - Progress (Week 1): Met PT Short Term Goal 4 (Week 1): Patient to negotiate 1 step with RW and CGA. PT Short Term Goal 4 - Progress (Week 1): Met Week 2:  PT Short Term Goal 1 (Week 2): STG = LTG due to estimated d/c date.  Skilled Therapeutic Interventions/Progress Updates:  Pt received in bed & agreeable to tx. Pt transfers to EOB with supervision and HOB elevated. Pt reports need to use restroom & transfers sit<>stand from EOB & elevated seat over toilet with supervision, ambulating in room/bathroom with RW & supervision with cuing for upright posture as pt continues to flex trunk with pt reporting she is stiff. Pt with continent void on toilet, performing peri hygiene & clothing management without assistance, and hand hygiene standing at sink with supervision. Discussed PT goals & ELOS with pt, as well as f/u therapy with pt requesting OPPT. Transported pt to gym via w/c dependent assist for time management. Pt completes car transfer with RW & supervision, ambulates over ramp with RW & supervision, and negotiates mulch with RW & CGA. Pt negotiates 6" curb with RW & CGA with max cuing for technique. During gait pt continues to demonstrate forward flexed posture with excessive B hip/knee flexion and foot drag with inability to perform B heel strike. Pt utilized dynavision while standing on foam without BUE support & CGA<>min assist for balance with task  focusing on standing balance & postural control. Pt ambulates 150 ft + 100 ft with RW & supervision with decreased gait speed. At end of session pt returns to bed with Mod I with HOB elevated. Pt left in bed with alarm set & all needs at hand.   Pt reports 6-7/10 soreness in LLE with movement/activity & also c/o L hip "catching" frequently throughout session, but states she is  premedicated & rest breaks provided PRN throughout session.    Therapy Documentation Precautions:  Precautions Precautions: Fall Precaution Comments: watch O2 Restrictions Weight Bearing Restrictions: Yes LLE Weight Bearing: Weight bearing as tolerated    Therapy/Group: Individual Therapy  Waunita Schooner 04/23/2019, 2:16 PM

## 2019-04-23 NOTE — Progress Notes (Signed)
Physical Therapy Session Note  Patient Details  Name: BLANNIE SHEDLOCK MRN: [id number] Date of Birth: [birth year]  Today's Date: 04/23/2019 PT Individual Time: 1499-6924 PT Individual Time Calculation (min): 17 min   Short Term Goals: Week 2:  PT Short Term Goal 1 (Week 2): STG = LTG due to estimated d/c date.  Skilled Therapeutic Interventions/Progress Updates:   pt rec'd in bed and agreeable to bed level therapy. Pt given written HEP and was able to verbalize and demo understanding for therex for LE strengthening including heel slides, hip abd/add, adduction squeeze, SAQ, SLR.  Pt left in bed with all needs at hand.   Therapy Documentation Precautions:  Precautions Precautions: Fall Precaution Comments: watch O2 Restrictions Weight Bearing Restrictions: Yes LLE Weight Bearing: Weight bearing as tolerated Pain:  pt c/o Lt hip pain, RN made aware   Therapy/Group: Individual Therapy  Shawan Corella 04/23/2019, 2:54 PM

## 2019-04-24 ENCOUNTER — Inpatient Hospital Stay (HOSPITAL_COMMUNITY): Payer: Medicare Other | Admitting: Physical Therapy

## 2019-04-24 ENCOUNTER — Inpatient Hospital Stay (HOSPITAL_COMMUNITY): Payer: Medicare Other

## 2019-04-24 NOTE — Plan of Care (Signed)
  Problem: Consults Goal: RH GENERAL PATIENT EDUCATION Description: See Patient Education module for education specifics. Outcome: Progressing Goal: Nutrition Consult-if indicated Outcome: Progressing   Problem: RH BOWEL ELIMINATION Goal: RH STG MANAGE BOWEL WITH ASSISTANCE Description: STG Manage Bowel with min Assistance. Outcome: Progressing Goal: RH STG MANAGE BOWEL W/MEDICATION W/ASSISTANCE Description: STG Manage Bowel with Medication with min Assistance. Outcome: Progressing   Problem: RH BLADDER ELIMINATION Goal: RH STG MANAGE BLADDER WITH ASSISTANCE Description: STG Manage Bladder With min Assistance Outcome: Progressing   Problem: RH SKIN INTEGRITY Goal: RH STG SKIN FREE OF INFECTION/BREAKDOWN Outcome: Progressing Goal: RH STG ABLE TO PERFORM INCISION/WOUND CARE W/ASSISTANCE Description: STG Able To Perform Incision/Wound Care With min Assistance. Outcome: Progressing   Problem: RH SAFETY Goal: RH STG ADHERE TO SAFETY PRECAUTIONS W/ASSISTANCE/DEVICE Description: STG Adhere to Safety Precautions With mod I Assistance/Device. Outcome: Progressing   Problem: RH PAIN MANAGEMENT Goal: RH STG PAIN MANAGED AT OR BELOW PT'S PAIN GOAL Description: Less than 3 out of 10 Outcome: Progressing

## 2019-04-24 NOTE — Progress Notes (Signed)
Occupational Therapy Session Note  Patient Details  Name: Brooke Washington MRN: [id number] Date of Birth: 11-30-1940  Today's Date: 04/24/2019 OT Individual Time: 8937-3428 OT Individual Time Calculation (min): 73 min    Short Term Goals: Week 1:  OT Short Term Goal 1 (Week 1): Pt will thread BLE through pants with CGA using LRAD OT Short Term Goal 1 - Progress (Week 1): Met OT Short Term Goal 2 (Week 1): Pt will transfer to [facility] over toilet with min A OT Short Term Goal 2 - Progress (Week 1): Met OT Short Term Goal 3 (Week 1): Pt will complete tub transfer with TTB with min A OT Short Term Goal 3 - Progress (Week 1): Met  Skilled Therapeutic Interventions/Progress Updates:     1;1. Pt received in bed with no report of pain but requesting to be premedicated in prep for mobility and daily therapy. Pt completes supine HOB elevated>sitting EOB with supervision. Pt ambulates into bathroom with supervision using RW with 1 VC for negotiating threshold with RW into bathroom. Pt toilets with S. Pt completes bathing and dressing with S overall sit to stand at sink with A for dressing UB/LB clothing using reacher to thread LLE into underwear with VC.   Therapy Documentation Precautions:  Precautions Precautions: Fall Precaution Comments: watch O2 Restrictions Weight Bearing Restrictions: Yes LLE Weight Bearing: Weight bearing as tolerated General:   Vital Signs: Therapy Vitals Temp: (!) 97.5 F (36.4 C) Temp Source: Oral Pulse Rate: 99 Resp: 16 BP: (!) 124/43 Patient Position (if appropriate): Sitting Oxygen Therapy SpO2: 92 % O2 Device: Room Air Pain:   ADL: ADL Eating: Independent Where Assessed-Eating: Chair Grooming: Supervision/safety Where Assessed-Grooming: Sitting at sink Upper Body Bathing: Supervision/safety Where Assessed-Upper Body Bathing: Sitting at sink Lower Body Bathing: Moderate assistance, Moderate cueing Where Assessed-Lower Body Bathing: Sitting at  sink, Standing at sink Upper Body Dressing: Modified independent (Device) Where Assessed-Upper Body Dressing: Sitting at sink Lower Body Dressing: Moderate cueing, Moderate assistance Where Assessed-Lower Body Dressing: Sitting at sink, Standing at sink Toileting: Moderate assistance, Minimal cueing Where Assessed-Toileting: Bedside Commode, Toilet Toilet Transfer: Moderate assistance Toilet Transfer Method: Stand pivot Toilet Transfer Equipment: Art gallery manager    Praxis   Exercises:   Other Treatments:     Therapy/Group: Individual Therapy  Tonny Branch 04/24/2019, 7:12 AM

## 2019-04-24 NOTE — Progress Notes (Addendum)
Occupational Therapy Discharge Summary  Patient Details  Name: Brooke Washington MRN: [id number] Date of Birth: 03/09/1941  Today's Date: 04/24/2019 OT Individual Time: 1500-1600 OT Individual Time Calculation (min): 60 min   Pt received in bed with pain reported in L hip not rated but premedicated. Pt completes ambulation with RW with Supervision and 1 minor LOB with no need for A. Pt completes TTB transfer as simulated "splash tub lift" transfer in prep for home. Pt able to manage BLE over tub ledge without A. Pt completes standing ball toss (overhead and chest toss) standing on tle, compliant surface and foam wedge with S increasing to MOD A on compliant wedge d/t posterior bias. Pt requesting to completes NuStep 8 min on level 4 (4 min) and level 3 (4 min) with VC for keeping >35 steps per min. Exited session with phone call to husband for education on supervision, AE use for bathing and dressing and safety awarness. No further questions from husband. Exited session with pt seated in bed exit alarm on and call light inr each  Patient has met 7 of 7 long term goals due to improved activity tolerance, improved balance, functional use of  LEFT lower extremity and improved coordination.  Patient to discharge at overall Supervision level.  Patient's care partner is independent to provide the necessary supervision assistance at discharge.    Reasons goals not met: n/a  Recommendation:  No follow up OT recommended  Equipment: No equipment provided  Reasons for discharge: treatment goals met and discharge from hospital  Patient/family agrees with progress made and goals achieved: Yes  OT Discharge Precautions/Restrictions  Precautions Precautions: Fall Precaution Comments: watch O2 Restrictions Weight Bearing Restrictions: Yes LLE Weight Bearing: Weight bearing as tolerated General   Vital Signs  Pain Pain Assessment Faces Pain Scale: No hurt ADL ADL Eating: Independent Where  Assessed-Eating: Chair Grooming: Independent Where Assessed-Grooming: Sitting at sink Upper Body Bathing: Supervision/safety Where Assessed-Upper Body Bathing: Sitting at sink Lower Body Bathing: Supervision/safety Where Assessed-Lower Body Bathing: Sitting at sink, Standing at sink Upper Body Dressing: Modified independent (Device) Where Assessed-Upper Body Dressing: Sitting at sink Lower Body Dressing: Supervision/safety Where Assessed-Lower Body Dressing: Sitting at sink, Standing at sink Toileting: Supervision/safety Where Assessed-Toileting: Bedside Commode, Toilet Toilet Transfer: Close supervision Toilet Transfer Method: Counselling psychologist: Bedside commode Vision Baseline Vision/History: Wears glasses Wears Glasses: At all times Patient Visual Report: Blurring of vision Vision Assessment?: No apparent visual deficits Perception  Perception: Within Functional Limits Praxis Praxis: Intact Cognition Overall Cognitive Status: Within Functional Limits for tasks assessed Arousal/Alertness: Awake/alert Orientation Level: Oriented X4 Attention: Sustained Sustained Attention: Appears intact Memory: Appears intact Awareness: Appears intact Problem Solving: Appears intact Safety/Judgment: Appears intact Sensation Sensation Light Touch: Impaired by gross assessment Central sensation comments: 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 Hot/Cold: Appears Intact Proprioception: Not tested Coordination Gross Motor Movements are Fluid and Coordinated: No Fine Motor Movements are Fluid and Coordinated: Yes Coordination and Movement Description: guarding 2/2 pain and generalized weakness Motor  Motor Motor: Abnormal postural alignment and control Motor - Skilled Clinical Observations: Generalized weakness Motor - Discharge Observations: Generalized weakness Mobility  Bed Mobility Bed Mobility: Supine to Sit;Sit to Supine Supine to Sit: Independent Sit to  Supine: Independent Transfers Sit to Stand: Supervision/Verbal cueing Stand to Sit: Supervision/Verbal cueing  Trunk/Postural Assessment  Cervical Assessment Cervical Assessment: Exceptions to WFL(forward head Simultaneous filing. User may not have seen previous data.) Thoracic Assessment Thoracic Assessment: Exceptions to WFL(rounded shoudler Simultaneous filing. User  may not have seen previous data.) Lumbar Assessment Lumbar Assessment: Exceptions to WFL(posterior pelvic tilt Simultaneous filing. User may not have seen previous data.) Postural Control Postural Control: Deficits on evaluation(Simultaneous filing. User may not have seen previous data.) Righting Reactions: delayed(Simultaneous filing. User may not have seen previous data.) Protective Responses: delayed  Balance Balance Balance Assessed: Yes Static Sitting Balance Static Sitting - Level of Assistance: 6: Modified independent (Device/Increase time) Dynamic Sitting Balance Dynamic Sitting - Level of Assistance: 6: Modified independent (Device/Increase time) Static Standing Balance Static Standing - Level of Assistance: 5: Stand by assistance Dynamic Standing Balance Dynamic Standing - Balance Support: No upper extremity supported;During functional activity(washing hands at sink) Dynamic Standing - Level of Assistance: 5: Stand by assistance Extremity/Trunk Assessment RUE Assessment RUE Assessment: Within Functional Limits General Strength Comments: Overall functional, deferred MMT 2/2 reported bone involvement from cx LUE Assessment LUE Assessment: Within Functional Limits General Strength Comments: Overall functional, deferred MMT 2/2 reported bone involvement from cx   Tonny Branch 04/24/2019, 12:14 PM

## 2019-04-24 NOTE — Progress Notes (Signed)
Physical Therapy Discharge Summary  Patient Details  Name: Brooke Washington MRN: [id number] Date of Birth: 1941-07-30  Today's Date: 04/24/2019   Patient has met 11 of 11 long term goals due to improved activity tolerance, improved balance, improved postural control, increased strength, ability to compensate for deficits, functional use of  left lower extremity and improved awareness.  Patient to discharge at an ambulatory level supervision with RW.   Patient's care partner is independent to provide the necessary physical assistance at discharge (reviewed pt's CLOF with husband Mikki Santee) via telephone today).   Reasons goals not met: n/a  Recommendation:  Patient will benefit from ongoing skilled PT services in outpatient setting to continue to advance safe functional mobility, address ongoing impairments in LLE strength, endurance, balance, stretching B heel cords, gait with LRAD, stair negotiation, and minimize fall risk.  Equipment: RW  Reasons for discharge: treatment goals met and discharge from hospital  Patient/family agrees with progress made and goals achieved: Yes  PT Discharge Precautions/Restrictions Precautions Precautions: Fall Restrictions Weight Bearing Restrictions: Yes LLE Weight Bearing: Weight bearing as tolerated  Vision/Perception  Pt wears glasses at all times at baseline. Per chart pt has reported blurring of vision 2/2 chemotherapy. No apparent visual deficits, perception WNL.   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 Proprioception: Not tested Coordination Gross Motor Movements are Fluid and Coordinated: No(2/2 LLE weakness  & pain) Fine Motor Movements are Fluid and Coordinated: Yes  Motor  Motor Motor: Abnormal postural alignment and control Motor - Skilled Clinical Observations: Generalized weakness Motor - Discharge Observations: Generalized weakness   Mobility Bed Mobility Bed Mobility: Supine to Sit;Sit to Supine Supine to Sit: Independent Sit to Supine: Independent Transfers Transfers: Sit to Stand;Stand to Sit Sit to Stand: Supervision/Verbal cueing Stand to Sit: Supervision/Verbal cueing Stand Pivot Transfers: Supervision/Verbal cueing Transfer (Assistive device): Rolling walker  Locomotion  Gait Ambulation: Yes Gait Assistance: Supervision/Verbal cueing Gait Distance (Feet): (>150 ft) Assistive device: Rolling walker Gait Assistance Details: Verbal cues for technique(verbal cues for posture) Gait Gait: Yes Gait Pattern: (excessive B hip/knee flexion (L>R) during swing phase, absent heel strike BLE, forward flexed posture) Gait velocity: decreased Stairs / Additional Locomotion Stairs: Yes Stairs Assistance: Supervision/Verbal cueing Stair Management Technique: Two rails Number of Stairs: 12 Height of Stairs: (6" + 3") Ramp: Supervision/Verbal cueing(ambulatory with RW) Curb: Supervision/Verbal cueing(3" step with RW) Product manager Mobility: Yes Wheelchair Assistance: Chartered loss adjuster: Both upper extremities Wheelchair Parts Management: Needs assistance Distance: 150 ft   Trunk/Postural Assessment  Cervical Assessment Cervical Assessment: Exceptions to WFL(forward head) Thoracic Assessment Thoracic Assessment: Exceptions to WFL(rounded shoudler) Lumbar Assessment Lumbar Assessment: Exceptions to WFL(posterior pelvic tilt) Postural Control Postural Control: Deficits on evaluation Righting Reactions: delayed Protective Responses: delayed   Balance Balance Balance Assessed: Yes Dynamic Standing Balance Dynamic Standing -  Balance Support: No upper extremity supported;During functional activity(washing hands at sink) Dynamic Standing - Level of Assistance: 5: Stand by assistance  Extremity Assessment  RUE Assessment RUE Assessment: Within Functional Limits LUE Assessment LUE Assessment: Within Functional Limits BLE strength not formally assessed, but grossly 3+/5 as pt able to weight bear without any buckling noted. LLE hip flexion limited by pain with movement. Pt with very tight B heel cords with difficulty achieving neutral ankle alignment -  pt reports neuropathy & deficits 2/2 chemotherapy treatments.   Waunita Schooner 04/24/2019, 2:49 PM

## 2019-04-24 NOTE — Progress Notes (Signed)
Physical Therapy Session Note  Patient Details  Name: Brooke Washington MRN: [id number] Date of Birth: July 02, 1941  Today's Date: 04/24/2019 PT Individual Time: 8588-5027 PT Individual Time Calculation (min): 69 min   Short Term Goals: Week 2:  PT Short Term Goal 1 (Week 2): STG = LTG due to estimated d/c date.  Skilled Therapeutic Interventions/Progress Updates:  Pt received asleep in bed but awakened & agreeable to tx. Provided pt with written instructions re: stepping up/down single step for home entrance with RW. Pt transfers to EOB with mod I with HOB raised, sit>stand with RW & supervision and ambulates in room/bathroom with RW & supervision. Pt performs clothing management and toilet transfer to elevated seat with supervision.  Pt performs hand hygiene and brushes hair standing at sink with supervision. Contacted pt's husband Mikki Santee) via speaker phone with pt present & educated him on pt's CLOF (supervision level), f/u therapy, need to use RW for mobility at home & need to review handout re: stair negotiation with Mikki Santee reporting it's only ~1 inch step to enter home. Pt & husband voice no concerns regarding pt d/c home this week. Pt ambulates room>ortho gym with RW & supervision with improved posture and step through pattern but still more than normal BLE hip/knee flexion during swing phase 2/2 impaired dorsiflexion & absent heel strike. Pt completes transfer from chair without armrests with chair stabilized against wall with supervision. Pt reports she has club chairs with armrests & wheels at home. Educated pt to not sit in these chairs and only do so if necessary & her husband is stabilizing them to prevent them from rolling with pt voicing understanding. Pt completes car transfer at small sedan simulated height with RW & supervision with cuing to sit vs stepping in and pt using UE to assist LLE in/out of car. Pt negotiates ramp & uneven surface (mulch) with RW & supervision. Pt negotiates 3" step  with RW & min assist on first trial 2/2 pt losing balance when turning in small area but close supervision for 2nd trial with only min cuing for compensatory pattern. Pt negotiates 12 steps (3" + 6") with B rails and close supervision. Pt propels w/c back to room with BUE & supervision with extra time. At end of session pt left sitting in w/c with all needs in reach, chair alarm donned.  Pt voices no questions/concerns regarding d/c home soon.  Therapy Documentation Precautions:  Precautions Precautions: Fall Precaution Comments: watch O2 Restrictions Weight Bearing Restrictions: Yes LLE Weight Bearing: Weight bearing as tolerated  Pain: Pt reports 5/10 soreness in LLE with rest breaks provided PRN during session. Meds requested from RN at end of session, upon pt request.    Therapy/Group: Individual Therapy  Waunita Schooner 04/24/2019, 12:07 PM

## 2019-04-24 NOTE — Progress Notes (Signed)
[place] PHYSICAL MEDICINE & REHABILITATION PROGRESS NOTE  Subjective/Complaints: Still having fullness and discomfort in bladder. No new issues overnight  ROS: Patient denies fever, rash, sore throat, blurred vision, nausea, vomiting, diarrhea, cough, shortness of breath or chest pain, joint or back pain, headache, or mood change.    Objective: Vital Signs: Blood pressure (!) 124/43, pulse 99, temperature (!) 97.5 F (36.4 C), temperature source Oral, resp. rate 16, height 5\' 4"  (1.626 m), weight 58.2 kg, SpO2 92 %. No results found. Recent Labs    04/23/19 1140  WBC 7.9  HGB 12.0  HCT 36.9  PLT 351   Recent Labs    04/23/19 0426  NA 137  K 4.4  CL 102  CO2 25  GLUCOSE 93  BUN 28*  CREATININE 0.75  CALCIUM 9.4    Physical Exam: BP (!) 124/43 (BP Location: Right Arm)   Pulse 99   Temp (!) 97.5 F (36.4 C) (Oral)   Resp 16   Ht 5\' 4"  (1.626 m)   Wt 58.2 kg   LMP  (LMP Unknown)   SpO2 92%   BMI 22.04 kg/m  Constitutional: No distress . Vital signs reviewed. HEENT: EOMI, oral membranes moist Neck: supple Cardiovascular: RRR without murmur. No JVD    Respiratory: CTA Bilaterally without wheezes or rales. Normal effort    GI: BS +, non-tender, non-distended  Musc: left hip tender Neurological: Alert and oriented Motor: Bilateral upper extremities: 5/5 proximal to distal, stable Right lower extremity: Hip flexion 4/5, knee extension 4+/5, ankle dorsiflexion 5/5, stable Left lower extremity: Hip flexion 4-/5, knee extension 4/5, and dorsiflexion 5/5, stable Skin:  Incision with dressing CDI stable Psychiatric:pleasant  Assessment/Plan: 1. Functional deficits secondary to left hip fracture status post left THA  which require 3+ hours per day of interdisciplinary therapy in a comprehensive inpatient rehab setting.  Physiatrist is providing close team supervision and 24 hour management of active medical problems listed below.  Physiatrist and rehab team  continue to assess barriers to discharge/monitor patient progress toward functional and medical goals  Care Tool:  Bathing    Body parts bathed by patient: Right arm, Left arm, Chest, Abdomen, Front perineal area, Buttocks, Right upper leg, Left upper leg, Right lower leg, Left lower leg, Face         Bathing assist Assist Level: Supervision/Verbal cueing     Upper Body Dressing/Undressing Upper body dressing   What is the patient wearing?: Bra, Pull over shirt    Upper body assist Assist Level: Independent    Lower Body Dressing/Undressing Lower body dressing      What is the patient wearing?: Underwear/pull up, Pants     Lower body assist Assist for lower body dressing: Supervision/Verbal cueing     Toileting Toileting    Toileting assist Assist for toileting: Supervision/Verbal cueing     Transfers Chair/bed transfer  Transfers assist     Chair/bed transfer assist level: Supervision/Verbal cueing     Locomotion Ambulation   Ambulation assist      Assist level: Supervision/Verbal cueing Assistive device: Walker-rolling Max distance: 150 ft   Walk 10 feet activity   Assist     Assist level: Supervision/Verbal cueing Assistive device: Walker-rolling   Walk 50 feet activity   Assist Walk 50 feet with 2 turns activity did not occur: Safety/medical concerns  Assist level: Supervision/Verbal cueing Assistive device: Walker-rolling    Walk 150 feet activity   Assist Walk 150 feet activity did not occur: Safety/medical concerns  Assist level: Supervision/Verbal cueing Assistive device: Walker-rolling    Walk 10 feet on uneven surface  activity   Assist Walk 10 feet on uneven surfaces activity did not occur: Safety/medical concerns   Assist level: Contact Guard/Touching assist Assistive device: Aeronautical engineer Will patient use wheelchair at discharge?: No Type of Wheelchair: Manual    Wheelchair  assist level: Supervision/Verbal cueing Max wheelchair distance: 13'    Wheelchair 50 feet with 2 turns activity    Assist        Assist Level: Supervision/Verbal cueing   Wheelchair 150 feet activity     Assist Wheelchair 150 feet activity did not occur: Safety/medical concerns          Medical Problem List and Plan: 1.  Deficits with mobility, transfers, self-care secondary to left hip fracture status post left THA on 04/10/2019.  Continue CIR PT, OT SLP  --Interdisciplinary Team Conference today   2.  Antithrombotics: -DVT/anticoagulation:  Pharmaceutical: Lovenox             -antiplatelet therapy: ASA for DVT prophylaxis after discharge 3. Pain Management: Tramadol d/ced due to Qtc  Controlled on 7/14 4. Mood: LCSW to follow for evaluation and support             -antipsychotic agents: N/A 5. Neuropsych: This patient is capable of making decisions on her own behalf.      Celexa 20 daily d/ced given Qtc, restarted per patient request, d/ced on 7/11 do to prolonged Qtc.  Discussed with pharmacy, transition to sertraline (potentially less risk, will have to monitor ECG for patient response) 12.5 started on 7/11.  ECG stable as per below. Recheck later this week 6. Skin/Wound Care: Monitor wound for healing.  Nutritional supplements added. 7. Fluids/Electrolytes/Nutrition: BUN trending up  -encourage PO  8. Hypoxic respiratory failure: COPD per films--encourage IS.    9. ABLA: Status post transfusion on 04/13/2019.  Continue iron supplement.    Transfused 2 units PRBC on 7/3  Hemoglobin 10.2 on 7/10, increased to 12 on 7/13 10.  Blood pressure:   Controlled on 7/13  Monitor with increased mobility 11. H/o lymphoma: To hold Revlimid for now.  Follow-up with heme-onc for resumption after discharge 12. Constipation: Continue bowel regimen.  Improving 13.  Hyperglycemia  Controlled on 7/14  Continue to monitor 14. Hypokalemia  Potassium 4.4 7/13  Magnesium  1.8   Supplemented IV on 7/7 15.  Prolonged Qtc  ECG reviewed, suggesting prolonged Qtc  Repeat ECG reviewed 7/13, QTC 492  See #5 16.  Bladder discomfort  UA+, UCX with 100k GNR  -continue empiric bactrim    -QTC unchanged yesterday  LOS: 10 days A FACE TO FACE EVALUATION WAS PERFORMED  Meredith Staggers 04/24/2019, 9:10 AM

## 2019-04-25 ENCOUNTER — Inpatient Hospital Stay (HOSPITAL_COMMUNITY): Payer: Medicare Other

## 2019-04-25 LAB — URINE CULTURE: Culture: >=100000 — AB

## 2019-04-25 MED ORDER — SERTRALINE HCL 25 MG PO TABS: 12.5000 mg | ORAL_TABLET | Freq: Every day | ORAL | 0 refills | Status: DC

## 2019-04-25 MED ORDER — POLYETHYLENE GLYCOL 3350 17 G PO PACK: 17.0000 g | PACK | Freq: Every day | ORAL | 0 refills | Status: DC | PRN

## 2019-04-25 MED ORDER — POTASSIUM CHLORIDE CRYS ER 20 MEQ PO TBCR: 20.0000 meq | EXTENDED_RELEASE_TABLET | Freq: Every day | ORAL | 0 refills | Status: DC

## 2019-04-25 MED ORDER — ACETAMINOPHEN 325 MG PO TABS: 325.0000 mg | ORAL_TABLET | Freq: Four times a day (QID) | ORAL | Status: DC | PRN

## 2019-04-25 MED ORDER — NITROFURANTOIN MONOHYD MACRO 100 MG PO CAPS: 100.0000 mg | ORAL_CAPSULE | Freq: Two times a day (BID) | ORAL | 0 refills | Status: DC

## 2019-04-25 MED ORDER — NITROFURANTOIN MONOHYD MACRO 100 MG PO CAPS
100.0000 mg | ORAL_CAPSULE | Freq: Two times a day (BID) | ORAL | Status: DC
  Administered 2019-04-25: 08:00:00 100 mg via ORAL
  Filled 2019-04-25: qty 1

## 2019-04-25 MED ORDER — METHOCARBAMOL 500 MG PO TABS: 500.0000 mg | ORAL_TABLET | Freq: Four times a day (QID) | ORAL | 1 refills | Status: DC | PRN

## 2019-04-25 MED ORDER — HEPARIN SOD (PORK) LOCK FLUSH 100 UNIT/ML IV SOLN
500.0000 [IU] | INTRAVENOUS | Status: AC | PRN
  Administered 2019-04-25: 09:00:00 500 [IU]

## 2019-04-25 NOTE — Patient Care Conference (Signed)
Inpatient RehabilitationTeam Conference and Plan of Care Update Date: 04/24/2019   Time: 2:40 PM    Patient Name: Brooke Washington      Medical Record Number: [id number]  Date of Birth: 04/07/1941 Sex: Female         Room/Bed: 4W19C/4W19C-01 Payor Info: Payor: MEDICARE / Plan: MEDICARE PART A AND B / Product Type: *No Product type* /    Admitting Diagnosis: 2. TBI Team  LT. Hip FX; 11-13days  Admit Date/Time:  04/14/2019  4:05 PM Admission Comments: No comment available   Primary Diagnosis:  S/p left hip fracture Principal Problem: S/p left hip fracture  Patient Active Problem List   Diagnosis Date Noted  . Hypomagnesemia   . Hypokalemia   . Prolonged Q-T interval on ECG   . Labile blood pressure   . S/p left hip fracture 04/15/2019  . Hyperglycemia   . Chronic obstructive pulmonary disease ([place])   . Acute blood loss anemia   . Drug induced constipation   . Status post total hip replacement, left   . Supplemental oxygen dependent   . Postoperative pain   . Closed left hip fracture, initial encounter ([place]) 04/10/2019  . Other fatigue 04/09/2019  . Leukopenia due to antineoplastic chemotherapy ([place]) 01/25/2019  . Bilateral arm pain 12/14/2018  . Pancytopenia, acquired ([place]) 06/20/2018  . Weight loss 06/20/2018  . Physical deconditioning 04/10/2018  . 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 Hypotension, unspecified 03/29/2018  . Chronic respiratory failure with hypoxia ([place]) 12/30/2017  . Other constipation 12/29/2017  . Neutropenic fever ([place]) 12/18/2017  . Thrombocytopenia ([place]) 12/18/2017  . Abdominal fullness 12/18/2017  . Acute urinary retention 12/18/2017  . Mucositis due to antineoplastic therapy 12/15/2017  . Heart burn 12/15/2017  . Frequent headaches 12/15/2017  . 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 Goals of care, counseling/discussion 11/29/2017  . Pathological fracture in neoplastic disease, right humerus, initial encounter  for fracture 11/24/2017  . Hyperuricemia 11/22/2017  . Pathological fracture due to neoplastic disease, initial encounter 11/21/2017  . COPD GOLD II 07/05/2017  . COLONIC POLYPS, HYPERPLASTIC, HX OF 05/28/2010  . 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 Depression 05/05/2007    Expected Discharge Date: Expected Discharge Date: 04/25/19  Team Members Present: Physician leading conference: Dr. Alger Simons Social Worker Present: Lennart Pall, LCSW Nurse Present: Mohammed Kindle, RN PT Present: Lavone Nian, PT OT Present: Mariane Masters, OT SLP Present: Weston Anna, SLP PPS Coordinator present : Gunnar Fusi, SLP     Current Status/Progress Goal Weekly Team Focus  Medical   left hip fx and THA, urine retention/discomfort---100GNR UTI on empiric bactrim. moving bowels. wound stable. hgb stable  improve functional mobility  rx bladder, pain   Bowel/Bladder   Continent of bowel. LBM 04/23/19 Continent of bladder Patient has been emptying bladder via tolieting q2-3 hrs. Order to i/o cath if no void  Continue to empty bladder and remain continent of bowel  assess toileting needs qshift/prn   Swallow/Nutrition/ Hydration             ADL's   min A LB ADLs, S-CGA transfers  Supervision ADLs and transfers  ADL retraining, AE training, endurnace, strengthening   Mobility   supervision overall with RW, bed mobility & HOB elevated  mod-I bed mobility, supervision overall  transfers, gait, LLE strengthening, endurance, stair negotiation with RW, d/c planning   Communication  Safety/Cognition/ Behavioral Observations            Pain   no complaints of pain  continue to remain pain free  assess pain qshift/prn   Skin   Incision to left hip, dressing intact, dry no drainage or s/s of infection noted  no new area of skin breakdown, continue to remain free of infection  assess qshift/prn    Rehab Goals Patient on target to meet rehab goals: Yes *See  Care Plan and progress notes for long and short-term goals.     Barriers to Discharge  Current Status/Progress Possible Resolutions Date Resolved   Physician    Medical stability        see medical progress notes      Nursing                  PT                    OT                  SLP                SW                Discharge Planning/Teaching Needs:  Home with spouse who can provide 24/7 assistance (and spoiling according to pt)  NA   Team Discussion:  Some bladder urgency;  +UTI - await culture. Cont b/b overall.  Pain controlled. Supervision with rw; ADLs and close to mod independent.  Revisions to Treatment Plan:  NA    Continued Need for Acute Rehabilitation Level of Care: The patient requires daily medical management by a physician with specialized training in physical medicine and rehabilitation for the following conditions: Daily direction of a multidisciplinary physical rehabilitation program to ensure safe treatment while eliciting the highest outcome that is of practical value to the patient.: Yes Daily medical management of patient stability for increased activity during participation in an intensive rehabilitation regime.: Yes Daily analysis of laboratory values and/or radiology reports with any subsequent need for medication adjustment of medical intervention for : Post surgical problems;Wound care problems;Other   I attest that I was present, lead the team conference, and concur with the assessment and plan of the team.   Donata Clay, Kameron Glazebrook 04/25/2019, 1:34 PM    Team conference was held via web/ teleconference due to [place] - 19

## 2019-04-25 NOTE — Progress Notes (Signed)
[place] PHYSICAL MEDICINE & REHABILITATION PROGRESS NOTE  Subjective/Complaints: No new issues. Bladder a "little" better. Denies fever  ROS: Patient denies fever, rash, sore throat, blurred vision, nausea, vomiting, diarrhea, cough, shortness of breath or chest pain,  headache, or mood change.   Objective: Vital Signs: Blood pressure (!) 143/68, pulse 81, temperature 98 F (36.7 C), temperature source Oral, resp. rate 16, height 5\' 4"  (1.626 m), weight 58.3 kg, SpO2 95 %. No results found. Recent Labs    04/23/19 1140  WBC 7.9  HGB 12.0  HCT 36.9  PLT 351   Recent Labs    04/23/19 0426  NA 137  K 4.4  CL 102  CO2 25  GLUCOSE 93  BUN 28*  CREATININE 0.75  CALCIUM 9.4    Physical Exam: BP (!) 143/68 (BP Location: Right Arm)   Pulse 81   Temp 98 F (36.7 C) (Oral)   Resp 16   Ht 5\' 4"  (1.626 m)   Wt 58.3 kg   LMP  (LMP Unknown)   SpO2 95%   BMI 22.07 kg/m  Constitutional: No distress . Vital signs reviewed. HEENT: EOMI, oral membranes moist Neck: supple Cardiovascular: RRR without murmur. No JVD    Respiratory: CTA Bilaterally without wheezes or rales. Normal effort    GI: BS +, non-tender, non-distended  Musc: left hip less tender, mild swelling Neurological: Alert and oriented Motor: Bilateral upper extremities: 5/5 proximal to distal, stable Right lower extremity: Hip flexion 4/5, knee extension 4+/5, ankle dorsiflexion 5/5, stable Left lower extremity: Hip flexion 4-/5, knee extension 4/5, and dorsiflexion 5/5, stable Skin:  Incision with dressing CDI with glue, foam dressing Psychiatric:pleasant  Assessment/Plan: 1. Functional deficits secondary to left hip fracture status post left THA  which require 3+ hours per day of interdisciplinary therapy in a comprehensive inpatient rehab setting.  Physiatrist is providing close team supervision and 24 hour management of active medical problems listed below.  Physiatrist and rehab team continue to  assess barriers to discharge/monitor patient progress toward functional and medical goals  Care Tool:  Bathing    Body parts bathed by patient: Right arm, Left arm, Chest, Abdomen, Front perineal area, Buttocks, Right upper leg, Left upper leg, Right lower leg, Left lower leg, Face         Bathing assist Assist Level: Supervision/Verbal cueing     Upper Body Dressing/Undressing Upper body dressing   What is the patient wearing?: Bra, Pull over shirt    Upper body assist Assist Level: Independent    Lower Body Dressing/Undressing Lower body dressing      What is the patient wearing?: Underwear/pull up, Pants     Lower body assist Assist for lower body dressing: Supervision/Verbal cueing     Toileting Toileting    Toileting assist Assist for toileting: Supervision/Verbal cueing     Transfers Chair/bed transfer  Transfers assist     Chair/bed transfer assist level: Supervision/Verbal cueing     Locomotion Ambulation   Ambulation assist      Assist level: Supervision/Verbal cueing Assistive device: Walker-rolling Max distance: >150 ft   Walk 10 feet activity   Assist     Assist level: Supervision/Verbal cueing Assistive device: Walker-rolling   Walk 50 feet activity   Assist Walk 50 feet with 2 turns activity did not occur: Safety/medical concerns  Assist level: Supervision/Verbal cueing Assistive device: Walker-rolling    Walk 150 feet activity   Assist Walk 150 feet activity did not occur: Safety/medical concerns  Assist  level: Supervision/Verbal cueing Assistive device: Walker-rolling    Walk 10 feet on uneven surface  activity   Assist Walk 10 feet on uneven surfaces activity did not occur: Safety/medical concerns   Assist level: Supervision/Verbal cueing Assistive device: Aeronautical engineer Will patient use wheelchair at discharge?: No Type of Wheelchair: Manual    Wheelchair assist level:  Supervision/Verbal cueing Max wheelchair distance: 150 ft    Wheelchair 50 feet with 2 turns activity    Assist        Assist Level: Supervision/Verbal cueing   Wheelchair 150 feet activity     Assist Wheelchair 150 feet activity did not occur: Safety/medical concerns   Assist Level: Supervision/Verbal cueing      Medical Problem List and Plan: 1.  Deficits with mobility, transfers, self-care secondary to left hip fracture status post left THA on 04/10/2019.  Continue CIR PT, OT SLP  -dc home today  -follow up with primary and ortho, h/o as directed 2.  Antithrombotics: -DVT/anticoagulation:  Pharmaceutical: Lovenox             -antiplatelet therapy: ASA for DVT prophylaxis after discharge 3. Pain Management: Tramadol d/ced due to Qtc  Controlled on 7/15 4. Mood: LCSW to follow for evaluation and support             -antipsychotic agents: N/A 5. Neuropsych: This patient is capable of making decisions on her own behalf.      Celexa 20 daily d/ced given Qtc, restarted per patient request, d/ced on 7/11 do to prolonged Qtc.  Discussed with pharmacy, transition to sertraline (potentially less risk, will have to monitor ECG for patient response) 12.5 started on 7/11.  ECG stable as per below.   6. Skin/Wound Care: may wash wound, pat dry, use foam dressing if needed 7. Fluids/Electrolytes/Nutrition: BUN trending up  -encourage PO  8. Hypoxic respiratory failure: COPD per films--encourage IS.    9. ABLA: Status post transfusion on 04/13/2019.  Continue iron supplement.    Transfused 2 units PRBC on 7/3  Hemoglobin 10.2 on 7/10, increased to 12 on 7/13 10.  Blood pressure:   Controlled on 7/13  Monitor with increased mobility 11. H/o lymphoma: To hold Revlimid for now.  Follow-up with heme-onc for resumption after discharge 12. Constipation: Continue bowel regimen.  Improving 13.  Hyperglycemia  Controlled on 7/14  Continue to monitor 14. Hypokalemia  Potassium 4.4  7/13  Magnesium 1.8   Supplemented IV on 7/7 15.  Prolonged Qtc  Improved, stable 16.  Bladder discomfort  UA+, UCX with 100k GNR  -E Coli R to bactrim---change to macrobid (7 day course)  -no need to recheck EKG with abx change LOS: 11 days A FACE TO FACE EVALUATION WAS PERFORMED  Meredith Staggers 04/25/2019, 9:01 AM

## 2019-04-25 NOTE — Progress Notes (Signed)
Pt discharged with personal belongings. Discharge instructions provided to pt by P. Love, PA. Pt verbalized an understanding of instructions, medications, and follow up appts.

## 2019-04-25 NOTE — Progress Notes (Signed)
Social Work Discharge Note   The overall goal for the admission was met for:   Discharge location: Yes - home with spouse who can provide 24/7 assistance  Length of Stay: Yes - 11 days  Discharge activity level: Yes - supervision  Home/community participation: Yes  Services provided included: MD, RD, PT, OT, RN, TR, Pharmacy and SW  Financial Services: Medicare and Private Insurance: [place]  Follow-up services arranged: Outpatient: PT via [facility] [place], DME: rolling walker via [place] and Patient/Family has no preference for HH/DME agencies  Comments (or additional information):      Contact info:  Raechel Chute Shacora Zynda @ [phone]  Patient/Family verbalized understanding of follow-up arrangements: Yes  Individual responsible for coordination of the follow-up plan: pt  Confirmed correct DME delivered: Rylan Bernard 04/25/2019    Yalissa Fink

## 2019-04-25 NOTE — Discharge Summary (Signed)
Physician Discharge Summary  [name] MRN: [id number] DOB/AGE: 1941/03/24 78 y.o.  Admit date: 04/14/2019 Discharge date: 04/25/2019  Discharge Diagnoses:  Principal Problem:   Femur fracture, left (HCC) Active Problems:   Chronic obstructive pulmonary disease (HCC)   Hypokalemia   Prolonged Q-T interval on ECG   Hypomagnesemia   Discharged Condition: stable   Significant Diagnostic Studies: N/A   Labs:  Basic Metabolic Panel: BMP Latest Ref Rng & Units 04/23/2019 04/20/2019 04/18/2019  Glucose 70 - 99 mg/dL 93 88 102(H)  BUN 8 - 23 mg/dL 28(H) 20 19  Creatinine 0.44 - 1.00 mg/dL 0.75 0.74 0.77  Sodium 135 - 145 mmol/L 137 141 141  Potassium 3.5 - 5.1 mmol/L 4.4 4.3 3.4(L)  Chloride 98 - 111 mmol/L 102 106 106  CO2 22 - 32 mmol/L 25 26 26   Calcium 8.9 - 10.3 mg/dL 9.4 8.8(L) 8.8(L)    CBC: CBC Latest Ref Rng & Units 04/23/2019 04/20/2019 04/16/2019  WBC 4.0 - 10.5 K/uL 7.9 5.2 5.1  Hemoglobin 12.0 - 15.0 g/dL 12.0 10.2(L) 10.8(L)  Hematocrit 36.0 - 46.0 % 36.9 31.0(L) 31.9(L)  Platelets 150 - 400 K/uL 351 277 197    CBG: No results for input(s): GLUCAP in the last 168 hours.  Brief HPI:   Brooke Washington is a 78 year old female with history of COPD and lymphoma who was admitted on 04/10/2019 after losing her balance and sustaining a fall onto left hip with left transcervical proximal femur fracture.  She underwent left anterior total hip arthroplasty by Dr. Lyla Glassing on 03/14/2019.  Postop WBAT and on aspirin for DVT prophylaxis.  She has had issues with hypoxia as well as hypotension and acute blood loss anemia affecting overall functional status.  She was transfused on 7/3 with improvement in H&H.  CIR was recommended due to functional decline   Hospital Course: Brooke Washington was admitted to rehab 04/14/2019 for inpatient therapies to consist of PT and OT at least three hours five days a week. Past admission physiatrist, therapy team and rehab RN have  worked together to provide customized collaborative inpatient rehab.  Labs done past admission revealed severe hypokalemia and hypomagnesemia.  She was received IV supplementation and was started on oral K-Dur as well as Mag-Ox with improvement.  EKG done revealed prolonged QTC that did show some decrease after supplementation.  Her Celexa was discontinued and she was started on low-dose Zoloft with follow up EKG showing much improvement.  Her respiratory status has been stable with MDI on board.   Acute blood loss anemia has resolved and left hip incision is healing well without any signs or symptoms of infection.  Pain control has been reasonable with the use of Robaxin on PRN basis.  She did have issues with diarrhea initially that has resolved.  She reported dysuria on 712 and was found to have E. coli UTI.  She was started on Macrodantin for 7-day course of antibiotic regimen.  Her p.o. intake has been good and she is continent of bowel and bladder.  She had had improvement in activity tolerance as well as overall mobility.  She has progressed to supervision level.  She will continue to receive further follow-up outpatient PT at [facility] starting 05/02/19 .    Rehab course: During patient's stay in rehab weekly team conferences were held to monitor patient's progress, set goals and discuss barriers to discharge. At admission, patient required mod assist with mobility and with basic self-care tasks.  She  has had improvement in activity tolerance, balance, postural control as well as ability to compensate for deficits.  She is able to complete ADLs with supervision.  She requires supervision for transfers and to ambulate > 150' with rolling walker.  She requires supervision to navigate 12 stairs.  Family education regarding ADLs and mobility was completed via phone.   Disposition: Home  Diet: Regular.   Special Instructions: 1. Continue low dose ASA bid for 2 weeks to complete DVT  prophylaxis course. 2. No driving or strenuous activity till cleared by MD.  3. Will need BMET/Mg level rechecked in 7-10 days.     Allergies as of 04/25/2019      Reactions   Contrast Media [iodinated Diagnostic Agents] Shortness Of Breath, Rash, Other (See Comments)   Throat swells   Iohexol Shortness Of Breath   Povidone-iodine Hives, Shortness Of Breath, Rash   REACTION: throat swells   Penicillins Hives, Itching, Rash   Has patient had a PCN reaction causing immediate rash, facial/tongue/throat swelling, SOB or lightheadedness with hypotension: Unknown Has patient had a PCN reaction causing severe rash involving mucus membranes or skin necrosis: Yes Has patient had a PCN reaction that required hospitalization: Unknown Has patient had a PCN reaction occurring within the last 10 years: Unknown If all of the above answers are "NO", then may proceed with Cephalosporin use.   Adhesive [tape] Hives      Medication List    STOP taking these medications   citalopram 40 MG tablet Commonly known as: CELEXA   ferrous sulfate 325 (65 FE) MG tablet   HYDROcodone-acetaminophen 5-325 MG tablet Commonly known as: NORCO/VICODIN     TAKE these medications   acetaminophen 325 MG tablet Commonly known as: TYLENOL Take 1-2 tablets (325-650 mg total) by mouth every 6 (six) hours as needed for mild pain (pain score 1-3 or temp > 100.5).   albuterol 108 (90 Base) MCG/ACT inhaler Commonly known as: VENTOLIN HFA Inhale 2 puffs into the lungs every 6 (six) hours as needed for wheezing or shortness of breath.   aspirin 81 MG chewable tablet Commonly known as: Aspirin Childrens Chew 1 tablet (81 mg total) by mouth 2 (two) times daily with a meal.   Breo Ellipta 100-25 MCG/INH Aepb Generic drug: fluticasone furoate-vilanterol ONE INHALTION INTO LUNGS EVERY DAY What changed: See the new instructions.   cholecalciferol 1000 units tablet Commonly known as: VITAMIN D Take 2,000 Units by mouth  daily.   lenalidomide 10 MG capsule Commonly known as: Revlimid Take 1 capsule daily for 7 days and then take 7 days off. Repeat every 2 weeks for cycle of every 28 days   methocarbamol 500 MG tablet Commonly known as: ROBAXIN Take 1 tablet (500 mg total) by mouth every 6 (six) hours as needed for muscle spasms.   nitrofurantoin (macrocrystal-monohydrate) 100 MG capsule Commonly known as: MACROBID Take 1 capsule (100 mg total) by mouth every 12 (twelve) hours.   polyethylene glycol 17 g packet Commonly known as: MIRALAX / GLYCOLAX Take 17 g by mouth daily as needed. What changed:   when to take this  reasons to take this   potassium chloride SA 20 MEQ tablet Commonly known as: K-DUR Take 1 tablet (20 mEq total) by mouth daily.   sertraline 25 MG tablet Commonly known as: ZOLOFT Take 0.5 tablets (12.5 mg total) by mouth daily. Start taking on: April 26, 2019 Notes to patient: New medication--takes place of Celexa to prevent side effects  Follow-up Information    Meredith Staggers, MD. Call.   Specialty: Physical Medicine and Rehabilitation Why: as needed Contact information: [address] [place] [address] [phone]        [name], NP. Call in 1 day(s).   Specialty: Family Medicine Why: for post hospital follow up and recheck BMET/EKG. Contact information: [place] [address] [phone]        Rod Can, MD. Call in 1 day(s).   Specialty: Orthopedic Surgery Why: for post op appointment Contact information: [address] [place] [address] [phone]        [name], MD. Call in 1 day(s).   Specialty: Hematology and Oncology Why: for follow up and input on Revlimid  Contact information: [place] [address] [phone]           Signed: Bary Leriche 04/25/2019, 1:38 PM

## 2019-04-25 NOTE — Discharge Instructions (Signed)
Inpatient Rehab Discharge Instructions  Brooke Washington Discharge date and time: 04/25/19    Activities/Precautions/ Functional Status: Activity: no lifting, driving, or strenuous exercise till cleared by MD Diet: regular diet Wound Care: keep wound clean and dry Contact MD if you develop any problems with your incision/wound--redness, swelling, increase in pain, drainage or if you develop fever or chills.   Functional status:  ___ No restrictions     ___ Walk up steps independently ___ 24/7 supervision/assistance   ___ Walk up steps with assistance _X__ Intermittent supervision/assistance  ___ Bathe/dress independently ___ Walk with walker     ___ Bathe/dress with assistance ___ Walk Independently    ___ Shower independently ___ Walk with assistance    _X__ Shower with assistance _X__ No alcohol     ___ Return to work/school ________    COMMUNITY REFERRALS UPON DISCHARGE:    Outpatient: PT                   Agency:  [facility] [place] Phone:  [phone]                Appointment Date/Time:  7/22 @ 10:00 am  Medical Equipment/Items Ordered: walker                                                     Agency/Supplier:  [employer] @ [phone]      Special Instructions: 1. Use aspirin twice a day for 3 more weeks to prevent blood clots.    My questions have been answered and I understand these instructions. I will adhere to these goals and the provided educational materials after my discharge from the hospital.  Patient/Caregiver Signature _______________________________ Date __________  Clinician Signature _______________________________________ Date __________  Please bring this form and your medication list with you to all your follow-up doctor's appointments.

## 2019-04-26 ENCOUNTER — Telehealth: Payer: Self-pay | Admitting: *Deleted

## 2019-04-26 NOTE — Telephone Encounter (Signed)
Telephone call to patients husband. He reports she is doing great. She is moving around the house better than before. She is eating and drinking. "She is doing wonderful". They agree to come in on Thursday. Appointments scheduled.   Mikki Santee knows to call this office in the interm with any new concerns or questions.

## 2019-04-26 NOTE — Telephone Encounter (Signed)
-----   Message from [name], MD sent at 04/26/2019 10:21 AM EDT ----- Regarding: call Husband BOB Looks like she got DC yesterday How is she doing/  I have opening and can see her next Thursday DO not resume chemo until I see her Labs and appt 30 mins

## 2019-04-30 DIAGNOSIS — S72032D Displaced midcervical fracture of left femur, subsequent encounter for closed fracture with routine healing: Secondary | ICD-10-CM | POA: Diagnosis not present

## 2019-05-01 ENCOUNTER — Ambulatory Visit: Payer: Medicare Other | Admitting: Adult Health

## 2019-05-01 ENCOUNTER — Encounter: Payer: Self-pay | Admitting: Adult Health

## 2019-05-01 ENCOUNTER — Ambulatory Visit (INDEPENDENT_AMBULATORY_CARE_PROVIDER_SITE_OTHER): Payer: Medicare Other | Admitting: Adult Health

## 2019-05-01 ENCOUNTER — Other Ambulatory Visit: Payer: Self-pay | Admitting: Hematology and Oncology

## 2019-05-01 ENCOUNTER — Other Ambulatory Visit: Payer: Self-pay

## 2019-05-01 DIAGNOSIS — E876 Hypokalemia: Secondary | ICD-10-CM | POA: Diagnosis not present

## 2019-05-01 DIAGNOSIS — F32A Depression, unspecified: Secondary | ICD-10-CM

## 2019-05-01 DIAGNOSIS — Z96642 Presence of left artificial hip joint: Secondary | ICD-10-CM

## 2019-05-01 DIAGNOSIS — F329 Major depressive disorder, single episode, unspecified: Secondary | ICD-10-CM

## 2019-05-01 DIAGNOSIS — D62 Acute posthemorrhagic anemia: Secondary | ICD-10-CM | POA: Diagnosis not present

## 2019-05-01 NOTE — Progress Notes (Signed)
Virtual Visit via Telephone Note  I connected with Brooke Washington on 05/01/19 at 11:30 AM EDT by telephone and verified that I am speaking with the correct person using two identifiers.   I discussed the limitations, risks, security and privacy concerns of performing an evaluation and management service by telephone and the availability of in person appointments. I also discussed with the patient that there may be a patient responsible charge related to this service. The patient expressed understanding and agreed to proceed.  Location patient: home Location provider: work or home office Participants present for the call: patient, provider Patient did not have a visit in the prior 7 days to address this/these issue(s).   History of Present Illness: 78 year old female who  has a past medical history of Cancer ([place]) ( APRIL OF 2000), COPD (chronic obstructive pulmonary disease) ([place]), Depression, Emphysema of lung ([place]), GERD (gastroesophageal reflux disease), Hyperlipidemia, and S/p left hip fracture (04/15/2019).  TCM Visit   Admit Date 04/09/2019 Discharge Date: 04/14/2019  She presented to the emergency room from home after a mechanical fall.  X-ray done in the emergency department showed a left transcervical proximal femur fracture.  She underwent left total hip arthroplasty.  After surgery she became hypotensive and hypoxic.  Surgical course was complicated by drop in hemoglobin down to 7.0.  Iron deficiency was noted on iron studies.  She was started on oral iron supplement and transfused with 2 units of PRBCs.  Hemoglobin on discharge was 12.0.  She was admitted to rehab on 04/14/2019 for inpatient therapies of PT and OT for at least 3 hours 5's a day.  An EKG done during her hospital visit revealed prolonged QTC.  Her Celexa was discontinued and she was started on low-dose Zoloft with follow-up EKG showing much improvement.  She was also supplemented with K-Dur as well as Mag-Ox for  hypokalemia and hypomagnesia.   He reports that since being discharged from the hospital she is having very little pain she is using a walker with ambulation while she gets her strength back to baseline.  She starts physical therapy outpatient this week and will be going every other day for about a month.  Her surgical incision has healed well and she denies any signs of infection.  He has not noticed any fevers, chills, diarrhea, shortness of breath, or chest pain.  His complaint today is that of feeling shaky anxious.  She contributes this to the low-dose Zoloft and is wondering if she can increase the dosage.  She has had TCM visit with her orthopedic surgeon yesterday and has follow-up with oncology later in the week   Observations/Objective: Patient sounds cheerful and well on the phone. I do not appreciate any SOB. Speech and thought processing are grossly intact. Patient reported vitals:  Assessment and Plan: 1. Acute blood loss anemia - CBC with Differential/Platelet; Future - Magnesium; Future  2. Status post total hip replacement, left - Follow up with Surgery as directed - Follow up with PT outpatient as directed - Continue to do home exercises  - CBC with Differential/Platelet; Future  3. Depression, unspecified depression type - Ok for her to increase Zoloft to 25 mg   4. Hypomagnesemia  - CBC with Differential/Platelet; Future - Magnesium; Future  5. Hypokalemia  - Basic Metabolic Panel; Future   Follow Up Instructions:  I did not refer this patient for an OV in the next 24 hours for this/these issue(s).  I discussed the assessment and treatment  plan with the patient. The patient was provided an opportunity to ask questions and all were answered. The patient agreed with the plan and demonstrated an understanding of the instructions.   The patient was advised to call back or seek an in-person evaluation if the symptoms worsen or if the condition fails to  improve as anticipated.  I provided 25 minutes of non-face-to-face time during this encounter.   [name], NP

## 2019-05-02 ENCOUNTER — Other Ambulatory Visit: Payer: Self-pay

## 2019-05-02 ENCOUNTER — Other Ambulatory Visit: Payer: Self-pay | Admitting: Physical Medicine and Rehabilitation

## 2019-05-02 ENCOUNTER — Ambulatory Visit: Payer: Medicare Other | Attending: Physical Medicine and Rehabilitation | Admitting: Physical Therapy

## 2019-05-02 ENCOUNTER — Encounter: Payer: Self-pay | Admitting: Physical Therapy

## 2019-05-02 DIAGNOSIS — M6281 Muscle weakness (generalized): Secondary | ICD-10-CM | POA: Diagnosis not present

## 2019-05-02 DIAGNOSIS — M25552 Pain in left hip: Secondary | ICD-10-CM | POA: Insufficient documentation

## 2019-05-02 DIAGNOSIS — R2689 Other abnormalities of gait and mobility: Secondary | ICD-10-CM | POA: Insufficient documentation

## 2019-05-02 DIAGNOSIS — Z8781 Personal history of (healed) traumatic fracture: Secondary | ICD-10-CM | POA: Insufficient documentation

## 2019-05-02 NOTE — Patient Instructions (Signed)
Access Code: [email or web address]  URL: [email or web address]  Date: 05/02/2019  Prepared by: Earlie Counts   Exercises Supine Heel Slides - 10 reps - 1 sets - 2x daily - 7x weekly Supine Quadricep Sets - 10 reps - 1 sets - 5 sec hold - 2x daily - 7x weekly Standing Heel Raise with Support - 10 reps - 1 sets - 2x daily - 7x weekly Standing Hip Abduction with Counter Support - 10 reps - 1 sets - 2x daily - 7x weekly Standing Hip Extension with Counter Support - 10 reps - 1 sets - 2x daily - 7x weekly [facility] [address], [place], [place] [address] Phone # [phone] Fax [phone]

## 2019-05-02 NOTE — Therapy (Signed)
[facility] [address] [place], [place], [address] Phone: [phone]   Fax:  [phone]  Physical Therapy Evaluation  Patient Details  Name: Brooke Washington MRN: [id number] Date of Birth: 08/15/1941 Referring Provider (PT): Reesa Chew [place]   Encounter Date: 05/02/2019  PT End of Session - 05/02/19 1045    Visit Number  1    Date for PT Re-Evaluation  06/27/19    Authorization Type  Medicare    PT Start Time  1000    PT Stop Time  1038    PT Time Calculation (min)  38 min    Activity Tolerance  Patient tolerated treatment well    Behavior During Therapy  [facility] for tasks assessed/performed       Past Medical History:  Diagnosis Date  . Cancer ([place])  APRIL OF 2000   LOW-GRADE NON-HODGKIN'S LYMPHOMA  . COPD (chronic obstructive pulmonary disease) ([place])   . Depression   . Emphysema of lung ([place])   . GERD (gastroesophageal reflux disease)   . Hyperlipidemia   . S/p left hip fracture 04/15/2019    Past Surgical History:  Procedure Laterality Date  . 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 FRACTURE SURGERY    . IR FLUORO GUIDE PORT INSERTION RIGHT  12/07/2017  . IR US GUIDE VASC ACCESS RIGHT  12/07/2017  . ORIF HUMERUS FRACTURE Right 11/23/2017   Procedure: OPEN REDUCTION INTERNAL FIXATION (ORIF) HUMERAL SHAFT FRACTURE;  Surgeon: Shona Needles, MD;  Location: [place];  Service: Orthopedics;  Laterality: Right;  . RIGHT OOPHORECTOMY  1976  . 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   Procedure: TOTAL HIP ARTHROPLASTY ANTERIOR APPROACH LEFT HIP;  Surgeon: Rod Can, MD;  Location: [place];  Service: Orthopedics;  Laterality: Left;    There were no vitals filed for this visit.   Subjective Assessment - 05/02/19 1006    Subjective  Patient fell on 04/09/2019 at home while putting something in pantry. Patient had a Anterior hip replacement on  04/09/2019. Patient was in the hospital from 04/09/2019 to 04/25/2019. Patient had 2 weeks of inpatient rehab in the hospital. Patient is using a rolling walker.    Patient Stated Goals  be able to walk with less pain and no walker, improve balance; fully straighten the left knee    Currently in Pain?  Yes    Pain Score  2     Pain Location  Hip    Pain Orientation  Left    Pain Descriptors / Indicators  Sore    Pain Type  Acute pain    Pain Onset  More than a month ago    Pain Frequency  Intermittent    Aggravating Factors   first thing in the morning    Pain Relieving Factors  as the day goals on         [facility] PT Assessment - 05/02/19 0001      Assessment   Medical Diagnosis  left hip fracture    Referring Provider (PT)  Reesa Chew PA    Onset Date/Surgical Date  04/09/19    Prior Therapy  2 weeks inpatient rehab      Precautions   Precautions  Other (comment)    Precaution Comments  cancer      Restrictions   Weight Bearing Restrictions  No      Balance Screen   Has the patient  fallen in the past 6 months  Yes    How many times?  1    Has the patient had a decrease in activity level because of a fear of falling?   Yes    Is the patient reluctant to leave their home because of a fear of falling?   Yes      [place] residence    Available Help at Discharge  Family    Type of [place] to enter    Entrance Stairs-Number of Steps  [place]  Two level    [place] - 2 wheels;Tub bench;Toilet riser      Prior Function   Level of Independence  Independent with household mobility with device    Vocation  Retired      Associate Professor   Overall Cognitive Status  Within Functional Limits for tasks assessed      Observation/Other Assessments   Skin Integrity  healed surgical site is tender and decreased mobility    Focus on Therapeutic Outcomes (FOTO)   57% limitation; goal is 35% limitation       Posture/Postural Control   Posture/Postural Control  No significant limitations      ROM / Strength   AROM / PROM / Strength  AROM;PROM;Strength      AROM   Right Knee Extension  0    Left Knee Extension  -7   sitting, catch in hip     PROM   Left Hip ABduction  3   supine     Strength   Left Hip Flexion  3+/5    Left Hip Extension  2+/5    Left Hip ABduction  2-/5    Left Hip ADduction  2+/5    Left Knee Flexion  3/5    Left Knee Extension  2/5    Left Ankle Plantar Flexion  4/5      Ambulation/Gait   Ambulation/Gait  Yes    Assistive device  Rolling walker    Gait Pattern  Decreased hip/knee flexion - left;Decreased dorsiflexion - left;Decreased weight shift to left    Stairs  No      Standardized Balance Assessment   Standardized Balance Assessment  Timed Up and Go Test      Timed Up and Go Test   TUG  Normal TUG    Normal TUG (seconds)  26   using rolling walker   TUG Comments  risk of falls                Objective measurements completed on examination: See above findings.              PT Education - 05/02/19 1039    Education Details  Access Code: [email or web address]    Person(s) Educated  Patient    Methods  Explanation;Demonstration;Verbal cues;Handout    Comprehension  Verbalized understanding;Returned demonstration       PT Short Term Goals - 05/02/19 1058      PT SHORT TERM GOAL #1   Title  understand tips to avoid falls    Period  Weeks    Status  New    Target Date  05/30/19      PT SHORT TERM GOAL #2   Title  independent with initial HEP    Time  4    Period  Weeks    Status  New  Target Date  05/30/19      PT SHORT TERM GOAL #3   Title  TUG score </= 15 seconds with rolling walker    Time  4    Period  Weeks    Status  New    Target Date  05/30/19      PT SHORT TERM GOAL #4   Title  pain in left hip decreased >/= 25% with movement of left hip    Time  4    Period  Weeks    Status  New    Target Date  05/30/19         PT Long Term Goals - 05/02/19 1100      PT LONG TERM GOAL #1   Title  independent with HEP and understand how to progress herself    Time  8    Period  Weeks    Status  New    Target Date  06/27/19      PT LONG TERM GOAL #2   Title  abilty to ambulate 6 minutes without a rest due to improved endurance and gait with least restrictive device    Time  8    Period  Weeks    Status  New    Target Date  06/27/19      PT LONG TERM GOAL #3   Title  abilty to go up and down her steps with step over step pattern due to increased bilateral lower extremity strength >/= 4+/5    Time  8    Period  Weeks    Status  New    Target Date  06/27/19      PT LONG TERM GOAL #4   Title  able to bring left leg onto the bed without assistance due to decreased left hip pain >/= 70% and increased strength    Time  8    Period  Weeks    Status  New    Target Date  06/27/19      PT LONG TERM GOAL #5   Title  TUG score </= 13 seconds to reduce her risk of falls in a public place without an assistive device due to improved balance    Time  8    Period  Weeks    Status  New    Target Date  06/27/19             Plan - 05/02/19 1048    Clinical Impression Statement  Patient is a 78 year old female who fell on 04/09/2019 fracturing her left hip. Patient had an anterior hip replacement on 04/10/19 and remained in the hospital till 04/25/2019. Patient had 2 weeks of inpatient rehab. Patient reports her left hip pain is 3/10 in the beginning of the day and when she lift her left leg. Patient has to assist her left leg onto her bed due to pain and weakness. Patient left hip averages 2/5. Left knee flexion is 3/5 and extension is 2/5. Patient left knee extension A/ROM is -7 in sitting. Left ankle plantarflexion is 4/5. TUG score is 26 seconds using a rolling walker indicating risk of falls. Patient walks with a rolling walker due to fear of falling and left hip pain. Patient walks with decreased left  hip and knee flexion and left ankle dorsiflexion. Patient will benefit from skilled therapy to improve left leg strength, reduce gait deficits, and improve mobility by reducing pain.    Personal Factors and Comorbidities  Comorbidity  1;Age;Comorbidity 2    Comorbidities  s/p left hip anterior hip replacement 04/10/2019; s/p non Hodgkins Lymphoma; COPD    Examination-Activity Limitations  Bathing;Locomotion Level;Transfers;Bed Mobility;Squat;Dressing;Stairs;Stand    Examination-Participation Restrictions  Meal Prep;Driving;Laundry;Shop    Stability/Clinical Decision Making  Evolving/Moderate complexity    Clinical Decision Making  Moderate    Rehab Potential  Excellent    PT Frequency  2x / week    PT Duration  8 weeks    PT Treatment/Interventions  Cryotherapy;Electrical Stimulation;Moist Heat;Balance training;Therapeutic exercise;Therapeutic activities;Gait training;Stair training;Neuromuscular re-education;Patient/family education;Dry needling;Passive range of motion;Scar mobilization;Manual techniques    PT Next Visit Plan  soft tissue work to left thigh and scar, weight shifting on rebounder, nustep, knee extension strength and ROM against gravity, left hip strength, PF strength on left    PT Home Exercise Plan  Access Code: [email or web address]    Consulted and Agree with Plan of Care  Patient       Patient will benefit from skilled therapeutic intervention in order to improve the following deficits and impairments:  Abnormal gait, Decreased range of motion, Difficulty walking, Increased fascial restricitons, Decreased endurance, Increased muscle spasms, Decreased activity tolerance, Pain, Decreased balance, Decreased scar mobility, Impaired flexibility, Decreased strength, Decreased mobility  Visit Diagnosis: 1. Pain in left hip   2. Muscle weakness (generalized)   3. Other abnormalities of gait and mobility   4. S/p left hip fracture        Problem List Patient Active Problem List    Diagnosis Date Noted  . Hypomagnesemia   . Hypokalemia   . Prolonged Q-T interval on ECG   . Femur fracture, left ([place]) 04/15/2019  . Chronic obstructive pulmonary disease ([place])   . Acute blood loss anemia   . Drug induced constipation   . Status post total hip replacement, left   . Supplemental oxygen dependent   . Postoperative pain   . Closed left hip fracture, initial encounter ([place]) 04/10/2019  . Other fatigue 04/09/2019  . Leukopenia due to antineoplastic chemotherapy ([place]) 01/25/2019  . Bilateral arm pain 12/14/2018  . Pancytopenia, acquired ([place]) 06/20/2018  . Weight loss 06/20/2018  . Physical deconditioning 04/10/2018  . Peripheral neuropathy due to chemotherapy ([place]) 03/29/2018  . Hypotension, unspecified 03/29/2018  . Chronic respiratory failure with hypoxia ([place]) 12/30/2017  . Other constipation 12/29/2017  . Neutropenic fever ([place]) 12/18/2017  . Thrombocytopenia ([place]) 12/18/2017  . Abdominal fullness 12/18/2017  . Acute urinary retention 12/18/2017  . Mucositis due to antineoplastic therapy 12/15/2017  . Heart burn 12/15/2017  . Frequent headaches 12/15/2017  . 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 Goals of care, counseling/discussion 11/29/2017  . Pathological fracture in neoplastic disease, right humerus, initial encounter for fracture 11/24/2017  . Hyperuricemia 11/22/2017  . Pathological fracture due to neoplastic disease, initial encounter 11/21/2017  . COPD GOLD II 07/05/2017  . COLONIC POLYPS, HYPERPLASTIC, HX OF 05/28/2010  . 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 Depression 05/05/2007    Earlie Counts, PT 05/02/19 11:12 AM   [facility] [address], [place], [place], [address] Phone: [phone]   Fax:  [phone]  Name: Brooke Washington MRN: [id number] Date of Birth: 1940-12-10

## 2019-05-03 ENCOUNTER — Inpatient Hospital Stay: Payer: Medicare Other | Attending: Hematology and Oncology

## 2019-05-03 ENCOUNTER — Encounter: Payer: Self-pay | Admitting: Hematology and Oncology

## 2019-05-03 ENCOUNTER — Inpatient Hospital Stay (HOSPITAL_BASED_OUTPATIENT_CLINIC_OR_DEPARTMENT_OTHER): Payer: Medicare Other | Admitting: Hematology and Oncology

## 2019-05-03 ENCOUNTER — Other Ambulatory Visit: Payer: Self-pay

## 2019-05-03 DIAGNOSIS — D61818 Other pancytopenia: Secondary | ICD-10-CM | POA: Insufficient documentation

## 2019-05-03 DIAGNOSIS — Z8572 Personal history of non-Hodgkin lymphomas: Secondary | ICD-10-CM | POA: Insufficient documentation

## 2019-05-03 DIAGNOSIS — C8338 Diffuse large B-cell lymphoma, lymph nodes of multiple sites: Secondary | ICD-10-CM

## 2019-05-03 DIAGNOSIS — C8588 Other specified types of non-Hodgkin lymphoma, lymph nodes of multiple sites: Secondary | ICD-10-CM

## 2019-05-03 LAB — CBC WITH DIFFERENTIAL/PLATELET
Abs Immature Granulocytes: 0.01 10*3/uL (ref 0.00–0.07)
Basophils Absolute: 0 10*3/uL (ref 0.0–0.1)
Basophils Relative: 1 %
Eosinophils Absolute: 0.5 10*3/uL (ref 0.0–0.5)
Eosinophils Relative: 8 %
HCT: 36.4 % (ref 36.0–46.0)
Hemoglobin: 11.9 g/dL — ABNORMAL LOW (ref 12.0–15.0)
Immature Granulocytes: 0 %
Lymphocytes Relative: 21 %
Lymphs Abs: 1.2 10*3/uL (ref 0.7–4.0)
MCH: 32.7 pg (ref 26.0–34.0)
MCHC: 32.7 g/dL (ref 30.0–36.0)
MCV: 100 fL (ref 80.0–100.0)
Monocytes Absolute: 0.7 10*3/uL (ref 0.1–1.0)
Monocytes Relative: 12 %
Neutro Abs: 3.3 10*3/uL (ref 1.7–7.7)
Neutrophils Relative %: 58 %
Platelets: 291 10*3/uL (ref 150–400)
RBC: 3.64 MIL/uL — ABNORMAL LOW (ref 3.87–5.11)
RDW: 15.9 % — ABNORMAL HIGH (ref 11.5–15.5)
WBC: 5.7 10*3/uL (ref 4.0–10.5)
nRBC: 0 % (ref 0.0–0.2)

## 2019-05-03 LAB — CMP (CANCER CENTER ONLY)
ALT: 10 U/L (ref 0–44)
AST: 12 U/L — ABNORMAL LOW (ref 15–41)
Albumin: 3.7 g/dL (ref 3.5–5.0)
Alkaline Phosphatase: 185 U/L — ABNORMAL HIGH (ref 38–126)
Anion gap: 10 (ref 5–15)
BUN: 23 mg/dL (ref 8–23)
CO2: 26 mmol/L (ref 22–32)
Calcium: 9.5 mg/dL (ref 8.9–10.3)
Chloride: 108 mmol/L (ref 98–111)
Creatinine: 0.78 mg/dL (ref 0.44–1.00)
GFR, Est AFR Am: >60 mL/min (ref >60)
GFR, Estimated: >60 mL/min (ref >60)
Glucose, Bld: 96 mg/dL (ref 70–99)
Potassium: 4.1 mmol/L (ref 3.5–5.1)
Sodium: 144 mmol/L (ref 135–145)
Total Bilirubin: 0.3 mg/dL (ref 0.3–1.2)
Total Protein: 6.2 g/dL — ABNORMAL LOW (ref 6.5–8.1)

## 2019-05-03 NOTE — Assessment & Plan Note (Signed)
I have reviewed the plan of care with the patient and her husband over the telephone The patient have no evidence of cancer recurrence She tolerated Revlimid poorly in the past with mucositis, weight loss and feeling unwell She is now recovering from recent fall/hip fracture She is undergoing physical therapy I recommend discontinuation of Revlimid We will observe closely for signs and symptoms of cancer recurrence I explained to the patient and her husband the rationale of not pursuing maintenance treatment because the risk of treatment outweighs the benefit right now

## 2019-05-03 NOTE — Assessment & Plan Note (Signed)
She had recent anemia postoperatively and had received blood transfusion Her blood count today is near normal I will recheck her blood count again next month

## 2019-05-03 NOTE — Progress Notes (Signed)
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[facility]) I have reviewed the plan of care with the patient and her husband over the telephone The patient have no evidence of cancer recurrence She tolerated Revlimid poorly in the past with mucositis, weight loss and feeling unwell She is now recovering from recent fall/hip fracture She is undergoing physical therapy I recommend discontinuation of Revlimid We will observe closely for signs and symptoms of cancer recurrence I explained to the patient and her husband the rationale of not pursuing maintenance treatment because the risk of treatment outweighs the benefit right now  Pancytopenia, acquired ([place]) She had recent anemia postoperatively and had received blood transfusion Her blood count today is near normal I will recheck her blood count again next month   No orders of the defined types were placed in this encounter.   INTERVAL HISTORY: Please see below for problem oriented charting. 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 She was treated with chlorambucil  in 2000.   --2007 - 09/2008.  Treated with single agent Rituxan.   --12/2008.  She had progressive adenopathy in chest and abdomen and repeat biopsy showed low grade follicular and diffuse B cell NHL.   --01/2009 - 07/2009. She required left ureteral stent and was treated with Treanda/Rituxan x 5 cycles from 02-2009 thru 06-2009.    09/16/2014 Imaging   No lymphadenopathy in the chest, abdomen, or pelvis. The small right axillary lymph nodes seen on the previous exam are unchanged in the 18 month interval. Small to moderate hiatal hernia.   11/20/2017 Imaging   Mildly displaced and angulated pathologic fracture of the proximal humeral shaft. 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    11/22/2017 PET scan   New hypermetabolic lymphadenopathy within the chest and abdomen, consistent with recurrent lymphoma. New large hypermetabolic mass in the right hepatic lobe, consistent with lymphoma this involvement. New small hypermetabolic mass in the left kidney, which may be due to lymphomatous involvement although primary renal cell carcinoma cannot be excluded. Hypermetabolic mass in the right humerus with pathologic fracture, consistent with lymphomatous involvement. Small hypermetabolic foci in right posterior chest wall, also suspicious for recurrent lymphoma.   11/23/2017 Procedure   Bone, fragment(s), Right Humeral Shaft Reamings - ATYPICAL LYMPHOID INFILTRATE CONSISTENT WITH NON-HODGKIN'S B CELL LYMPHOMA. - SEE ONCOLOGY TABLE. Microscopic Comment LYMPHOMA Histologic type: Non-Hodgkins lymphoma, favor large cell type. Grade (if applicable): Favor high grade. Flow cytometry: No specimen is available for analysis. Immunohistochemical stains: BCL-2, BCL-6, CD3, CD5, CD10, CD15, CD20, CD30, CD34, CD43, LCA, CD79a, CD138, Ki-67, PAX-5 and TdT with appropriate controls as performed on block 1C. Touch preps/imprints: Not performed. Comments: The sections show  multiple, primarily  soft tissue fragments displaying a dense lymphoid infiltrate characterized by a mixture of small round to slightly irregular lymphocytes, histiocytes, and large centroblastic appearing lymphoid cells with partially clumped to vesicular chromatin, small nucleoli and amphophilic to clear cytoplasm. The number of admixed large lymphoid cells is variable but they appear relatively abundant in some areas associated with clustering. No material is available for flow cytometric analysis. Hence, a large batter of immunohistochemical stains were performed and show a mixture of T and B cells but with a significant B cell component mostly composed of large lymphoid cells with positivity for CD79a, PAX-5, CD30, BCL-2, and BCL-6. LCA shows diffuse staining although some of the large lymphoid cells appear negative. No significant staining is seen with CD20, CD10, CD15, CD34, TdT, or CD138. The lack of CD20 expression in this material is possibly related to prior treatment. There is variable increased expression of Ki-67 ranging from 10 to >50% in some areas particularly where there is abundance of large lymphoid cells. There is admixed abundant T cell population in the background as seen with CD3, CD5, and CD43 with on apparent co-expression of CD5 in B cell areas. The overall findings are consistent with involvement by non-Hodgkin's B cell lymphoma and the presence of relative abundance of large lymphoid B-cells and increased Ki-67 expression favor a high grade large B cell lymphoma.   11/23/2017 Surgery   1. CPT 24516-Intramedullary nailing of right humerus fracture 2. CPT 20245-Bone biopsy, deep     11/23/2017 - 11/25/2017 Hospital Admission   She was admitted to the hospital for management of humerus fracture and aggressive lymphoma   11/24/2017 Imaging   ECHO showed LV EF: 65% -   70%   11/25/2017 Procedure   Status post ultrasound-guided biopsy of liver mass. Tissue specimen sent to pathology  for complete histopathologic analysis   12/07/2017 Imaging   Successful placement of a right internal jugular approach power injectable Port-A-Cath. The catheter is ready for immediate use.   12/09/2017 - 03/29/2018 Chemotherapy   The patient had R-CHOP chemo     12/17/2017 - 12/20/2017 Hospital Admission   She was hospitalized for influenza infection   01/03/2018 Procedure   Unsuccessful initial L4-5 LP. Successful L2-3 lumbar puncture with installation of methotrexate.   01/24/2018 Procedure   Non complicated injection of intrathecal methotrexate, as above.   02/09/2018 Imaging   LV EF: 55% -  60%   02/10/2018 PET scan   1. Interval response to therapy. There has been resolution of previous hypermetabolic tumor within the chest and abdomen. Deauville criteria 2 and 3. 2. Diffuse radiotracer uptake is identified within the axial and appendicular skeleton which is favored to represent treatment related changes. 3. Significant decrease in FDG uptake associated with previously noted hypermetabolic tumor involving the proximal humeral diaphysis. The FDG uptake within this area on today's study is equivalent to that which is seen in the lumbar spine. 4. Aortic Atherosclerosis (ICD10-I70.0). Lad coronary artery calcifications. 5. Hiatal hernia   02/15/2018 Procedure   Fluoroscopic guided lumbar puncture and intrathecal injection of chemotherapy without complication.   03/31/2018 Procedure   Technically successful fluoroscopic guided lumbar puncture for intrathecal methotrexate injection. Procedure was well tolerated without evidence for immediate complications. Patient will recover in short stay prior to being discharged home   05/08/2018 PET scan   No residual/persistent enlarged or hypermetabolic lymph nodes in the neck, chest, abdomen or pelvis. Findings suggest an excellent response to treatment.   05/25/2018 -  Chemotherapy   The  patient had Revlimid for maintenance chemo    06/07/2018 Imaging    LV EF: 55% -  60%   09/25/2018 PET scan   1. Stable exam. No hypermetabolic lymphadenopathy in the neck, chest, abdomen, or pelvis. 2.  Aortic Atherosclerois (ICD10-170.0) 3.  Emphysema. (ICD10-J43.9)    04/09/2019 Imaging   1. No CT findings without contrast to suggest recurrent lymphoma involving the chest, abdomen or pelvis. 2. Stable advanced atherosclerotic calcifications involving the thoracic and abdominal aorta and branch vessels. 3. Moderate to large hiatal hernia. 4. No significant bony findings.     REVIEW OF SYSTEMS:   Constitutional: Denies fevers, chills or abnormal weight loss Eyes: Denies blurriness of vision Ears, nose, mouth, throat, and face: Denies mucositis or sore throat Respiratory: Denies cough, dyspnea or wheezes Cardiovascular: Denies palpitation, chest discomfort or lower extremity swelling Gastrointestinal:  Denies nausea, heartburn or change in bowel habits Skin: Denies abnormal skin rashes Lymphatics: Denies new lymphadenopathy or easy bruising Neurological:Denies numbness, tingling or new weaknesses Behavioral/Psych: Mood is stable, no new changes  All other systems were reviewed with the patient and are negative.  I have reviewed the past medical history, past surgical history, social history and family history with the patient and they are unchanged from previous note.  ALLERGIES:  is allergic to contrast media [iodinated diagnostic agents]; iohexol; povidone-iodine; penicillins; and adhesive [tape].  MEDICATIONS:  Current Outpatient Medications  Medication Sig Dispense Refill  . acetaminophen (TYLENOL) 325 MG tablet Take 1-2 tablets (325-650 mg total) by mouth every 6 (six) hours as needed for mild pain (pain score 1-3 or temp > 100.5).    Marland Kitchen albuterol (PROVENTIL HFA;VENTOLIN HFA) 108 (90 Base) MCG/ACT inhaler Inhale 2 puffs into the lungs every 6 (six) hours as needed for wheezing or shortness of breath. 1 Inhaler 0  . aspirin (ASPIRIN  CHILDRENS) 81 MG chewable tablet Chew 1 tablet (81 mg total) by mouth 2 (two) times daily with a meal. 84 tablet 0  . BREO ELLIPTA 100-25 MCG/INH AEPB ONE INHALTION INTO LUNGS EVERY DAY (Patient taking differently: Inhale 1 puff into the lungs every other day. ) 60 each 6  . cholecalciferol (VITAMIN D) 1000 units tablet Take 2,000 Units by mouth daily.    Marland Kitchen KLOR-CON M20 20 MEQ tablet TAKE 1 TABLET BY MOUTH EVERY DAY 90 tablet 1  . lenalidomide (REVLIMID) 10 MG capsule Take 1 capsule daily for 7 days and then take 7 days off. Repeat every 2 weeks for cycle of every 28 days 14 capsule 11  . methocarbamol (ROBAXIN) 500 MG tablet Take 1 tablet (500 mg total) by mouth every 6 (six) hours as needed for muscle spasms. 30 tablet 1  . nitrofurantoin, macrocrystal-monohydrate, (MACROBID) 100 MG capsule Take 1 capsule (100 mg total) by mouth every 12 (twelve) hours. 12 capsule 0  . polyethylene glycol (MIRALAX / GLYCOLAX) 17 g packet Take 17 g by mouth daily as needed. 14 each 0  . sertraline (ZOLOFT) 25 MG tablet Take 0.5 tablets (12.5 mg total) by mouth daily. (Patient taking differently: Take 25 mg by mouth daily. ) 15 tablet 0   No current facility-administered medications for this visit.     PHYSICAL EXAMINATION: ECOG PERFORMANCE STATUS: 1 - Symptomatic but completely ambulatory  Vitals:   05/03/19 1054  BP: 126/71  Pulse: 90  Resp: 18  Temp: 98.9 F (37.2 C)  SpO2: 98%   Filed Weights   05/03/19 1054  Weight: 117 lb 9.6 oz (53.3 kg)  GENERAL:alert, no distress and comfortable SKIN: skin color, texture, turgor are normal, no rashes or significant lesions EYES: normal, Conjunctiva are pink and non-injected, sclera clear OROPHARYNX:no exudate, no erythema and lips, buccal mucosa, and tongue normal  NECK: supple, thyroid normal size, non-tender, without nodularity LYMPH:  no palpable lymphadenopathy in the cervical, axillary or inguinal LUNGS: clear to auscultation and percussion with  normal breathing effort HEART: regular rate & rhythm and no murmurs and no lower extremity edema ABDOMEN:abdomen soft, non-tender and normal bowel sounds Musculoskeletal:no cyanosis of digits and no clubbing  NEURO: alert & oriented x 3 with fluent speech, no focal motor/sensory deficits  LABORATORY DATA:  I have reviewed the data as listed    Component Value Date/Time   NA 144 05/03/2019 1019   NA 142 12/28/2016 1002   K 4.1 05/03/2019 1019   K 4.6 12/28/2016 1002   CL 108 05/03/2019 1019   CL 101 03/28/2013 0826   CO2 26 05/03/2019 1019   CO2 29 12/28/2016 1002   GLUCOSE 96 05/03/2019 1019   GLUCOSE 85 12/28/2016 1002   GLUCOSE 84 03/28/2013 0826   BUN 23 05/03/2019 1019   BUN 20.3 12/28/2016 1002   CREATININE 0.78 05/03/2019 1019   CREATININE 0.9 12/28/2016 1002   CALCIUM 9.5 05/03/2019 1019   CALCIUM 10.1 12/28/2016 1002   PROT 6.2 (L) 05/03/2019 1019   PROT 6.8 12/28/2016 1002   ALBUMIN 3.7 05/03/2019 1019   ALBUMIN 4.2 12/28/2016 1002   AST 12 (L) 05/03/2019 1019   AST 20 12/28/2016 1002   ALT 10 05/03/2019 1019   ALT 12 12/28/2016 1002   ALKPHOS 185 (H) 05/03/2019 1019   ALKPHOS 111 12/28/2016 1002   BILITOT 0.3 05/03/2019 1019   BILITOT 0.55 12/28/2016 1002   GFRNONAA >60 05/03/2019 1019   GFRAA >60 05/03/2019 1019    No results found for: SPEP, UPEP  Lab Results  Component Value Date   WBC 5.7 05/03/2019   NEUTROABS 3.3 05/03/2019   HGB 11.9 (L) 05/03/2019   HCT 36.4 05/03/2019   MCV 100.0 05/03/2019   PLT 291 05/03/2019      Chemistry      Component Value Date/Time   NA 144 05/03/2019 1019   NA 142 12/28/2016 1002   K 4.1 05/03/2019 1019   K 4.6 12/28/2016 1002   CL 108 05/03/2019 1019   CL 101 03/28/2013 0826   CO2 26 05/03/2019 1019   CO2 29 12/28/2016 1002   BUN 23 05/03/2019 1019   BUN 20.3 12/28/2016 1002   CREATININE 0.78 05/03/2019 1019   CREATININE 0.9 12/28/2016 1002      Component Value Date/Time   CALCIUM 9.5 05/03/2019 1019    CALCIUM 10.1 12/28/2016 1002   ALKPHOS 185 (H) 05/03/2019 1019   ALKPHOS 111 12/28/2016 1002   AST 12 (L) 05/03/2019 1019   AST 20 12/28/2016 1002   ALT 10 05/03/2019 1019   ALT 12 12/28/2016 1002   BILITOT 0.3 05/03/2019 1019   BILITOT 0.55 12/28/2016 1002       RADIOGRAPHIC STUDIES: I have personally reviewed the radiological images as listed and agreed with the findings in the report. Ct Abdomen Pelvis Wo Contrast  Result Date: 04/09/2019 CLINICAL DATA:  Non-Hodgkin's lymphoma. EXAM: CT CHEST, ABDOMEN AND PELVIS WITHOUT CONTRAST TECHNIQUE: Multidetector CT imaging of the chest, abdomen and pelvis was performed following the standard protocol without IV contrast. COMPARISON:  Multiple PET-CT scans from 2019 FINDINGS: CT CHEST FINDINGS Cardiovascular: The heart is  normal in size. No pericardial effusion. Stable moderate tortuosity, ectasia and calcification of the thoracic aorta. Stable coronary artery calcifications. Mediastinum/Nodes: No mediastinal or hilar mass or lymphadenopathy. The esophagus is grossly normal. There is a moderate to large hiatal hernia. Lungs/Pleura: Stable emphysematous changes and pulmonary scarring. No worrisome pulmonary lesions or pulmonary nodules. No pleural effusion or pleural lesions. Musculoskeletal: Degenerative changes and mild scoliosis and osteoporosis but no worrisome bone lesions. Remote healed sternal fracture. No breast masses, supraclavicular or axillary adenopathy. Extensive carotid artery calcifications are noted. The thyroid gland is grossly normal. The right Port-A-Cath is stable. CT ABDOMEN PELVIS FINDINGS Hepatobiliary: No focal hepatic lesions are identified without contrast. The gallbladder appears normal. No common bile duct dilatation. Pancreas: No mass, inflammation or ductal dilatation. Spleen: Normal size.  No focal lesions. Adrenals/Urinary Tract: The adrenal glands and kidneys are unremarkable. No renal, ureteral or bladder calculi or  obvious mass without contrast. Stomach/Bowel: The stomach, duodenum, small bowel and colon are unremarkable. No acute inflammatory changes, mass lesions or obstructive findings. Vascular/Lymphatic: Stable advanced atherosclerotic calcifications involving the aorta, iliac arteries and branch vessels but no aneurysm. No mesenteric or retroperitoneal mass or adenopathy. Reproductive: The uterus and ovaries are unremarkable for age. Other: No pelvic mass or pelvic lymphadenopathy. No inguinal lymphadenopathy. Musculoskeletal: No significant bony findings. No worrisome bone lesions. Remote healed right pubic rami fractures are noted. Small sclerotic areas in the greater trochanter and right ischium may be related to small bone infarcts. No change. IMPRESSION: 1. No CT findings without contrast to suggest recurrent lymphoma involving the chest, abdomen or pelvis. 2. Stable advanced atherosclerotic calcifications involving the thoracic and abdominal aorta and branch vessels. 3. Moderate to large hiatal hernia. 4. No significant bony findings. Electronically Signed   By: Marijo Sanes M.D.   On: 04/09/2019 11:36   Dg Chest 1 View  Result Date: 04/10/2019 CLINICAL DATA:  Apparent pain status post fall red EXAM: CHEST  1 VIEW COMPARISON:  CT from June 07/01/2019 FINDINGS: Again noted are emphysematous changes bilaterally. There is a well-positioned right-sided Port-A-Cath. 2 there is no pneumothorax. The and the there is scarring at the lung bases. There is no acute osseous abnormality. Heart size is normal. Aortic calcifications are again seen. The no IMPRESSION: No active disease. Electronically Signed   By: Constance Holster M.D.   On: 04/10/2019 01:05   Ct Chest Wo Contrast  Result Date: 04/09/2019 CLINICAL DATA:  Non-Hodgkin's lymphoma. EXAM: CT CHEST, ABDOMEN AND PELVIS WITHOUT CONTRAST TECHNIQUE: Multidetector CT imaging of the chest, abdomen and pelvis was performed following the standard protocol without IV  contrast. COMPARISON:  Multiple PET-CT scans from 2019 FINDINGS: CT CHEST FINDINGS Cardiovascular: The heart is normal in size. No pericardial effusion. Stable moderate tortuosity, ectasia and calcification of the thoracic aorta. Stable coronary artery calcifications. Mediastinum/Nodes: No mediastinal or hilar mass or lymphadenopathy. The esophagus is grossly normal. There is a moderate to large hiatal hernia. Lungs/Pleura: Stable emphysematous changes and pulmonary scarring. No worrisome pulmonary lesions or pulmonary nodules. No pleural effusion or pleural lesions. Musculoskeletal: Degenerative changes and mild scoliosis and osteoporosis but no worrisome bone lesions. Remote healed sternal fracture. No breast masses, supraclavicular or axillary adenopathy. Extensive carotid artery calcifications are noted. The thyroid gland is grossly normal. The right Port-A-Cath is stable. CT ABDOMEN PELVIS FINDINGS Hepatobiliary: No focal hepatic lesions are identified without contrast. The gallbladder appears normal. No common bile duct dilatation. Pancreas: No mass, inflammation or ductal dilatation. Spleen: Normal size.  No focal  lesions. Adrenals/Urinary Tract: The adrenal glands and kidneys are unremarkable. No renal, ureteral or bladder calculi or obvious mass without contrast. Stomach/Bowel: The stomach, duodenum, small bowel and colon are unremarkable. No acute inflammatory changes, mass lesions or obstructive findings. Vascular/Lymphatic: Stable advanced atherosclerotic calcifications involving the aorta, iliac arteries and branch vessels but no aneurysm. No mesenteric or retroperitoneal mass or adenopathy. Reproductive: The uterus and ovaries are unremarkable for age. Other: No pelvic mass or pelvic lymphadenopathy. No inguinal lymphadenopathy. Musculoskeletal: No significant bony findings. No worrisome bone lesions. Remote healed right pubic rami fractures are noted. Small sclerotic areas in the greater trochanter  and right ischium may be related to small bone infarcts. No change. IMPRESSION: 1. No CT findings without contrast to suggest recurrent lymphoma involving the chest, abdomen or pelvis. 2. Stable advanced atherosclerotic calcifications involving the thoracic and abdominal aorta and branch vessels. 3. Moderate to large hiatal hernia. 4. No significant bony findings. Electronically Signed   By: Marijo Sanes M.D.   On: 04/09/2019 11:36   Pelvis Portable  Result Date: 04/10/2019 CLINICAL DATA:  Postop EXAM: PORTABLE PELVIS 1-2 VIEWS COMPARISON:  04/10/2019 FINDINGS: Interval left hip replacement with normal alignment. Small amount of gas in the soft tissues consistent with recent surgery. Stable sclerotic foci within the right ischium and right trochanter of the femur. IMPRESSION: 1. Status post left hip replacement with expected postsurgical changes. Electronically Signed   By: Donavan Foil M.D.   On: 04/10/2019 20:59   Dg Chest Port 1 View  Result Date: 04/12/2019 CLINICAL DATA:  Hypoxia EXAM: PORTABLE CHEST 1 VIEW COMPARISON:  04/10/2019 FINDINGS: Cardiac shadow is stable. Right chest wall port is again seen. Aortic calcifications are noted. The lungs are hyperinflated consistent with COPD. No focal infiltrate is noted. No bony abnormality is seen. IMPRESSION: COPD without acute abnormality. Electronically Signed   By: Inez Catalina M.D.   On: 04/12/2019 09:43   Dg C-arm 1-60 Min  Result Date: 04/10/2019 CLINICAL DATA:  Total hip replacement EXAM: DG C-ARM 61-120 MIN; OPERATIVE LEFT HIP WITH PELVIS COMPARISON:  04/10/2019 FINDINGS: Two low resolution intraoperative spot views of the left hip. Total fluoroscopy time was 10 seconds. The images demonstrate a left hip replacement with normal alignment. IMPRESSION: Intraoperative fluoroscopic assistance provided during left hip replacement surgery Electronically Signed   By: Donavan Foil M.D.   On: 04/10/2019 20:58   Dg Hip Operative Unilat With Pelvis  Left  Result Date: 04/10/2019 CLINICAL DATA:  Total hip replacement EXAM: DG C-ARM 61-120 MIN; OPERATIVE LEFT HIP WITH PELVIS COMPARISON:  04/10/2019 FINDINGS: Two low resolution intraoperative spot views of the left hip. Total fluoroscopy time was 10 seconds. The images demonstrate a left hip replacement with normal alignment. IMPRESSION: Intraoperative fluoroscopic assistance provided during left hip replacement surgery Electronically Signed   By: Donavan Foil M.D.   On: 04/10/2019 20:58   Dg Hip Unilat With Pelvis 2-3 Views Left  Result Date: 04/10/2019 CLINICAL DATA:  Fall EXAM: DG HIP (WITH OR WITHOUT PELVIS) 2-3V LEFT COMPARISON:  None. FINDINGS: There is an acute impacted transcervical fracture of the proximal left femur. There are stable sclerotic lesions in the right ischium and right femur. A large amount of stool throughout the colon is noted. IMPRESSION: Acute impacted transcervical fracture of the proximal left femur Electronically Signed   By: Constance Holster M.D.   On: 04/10/2019 01:04    All questions were answered. The patient knows to call the clinic with any problems, questions or  concerns. No barriers to learning was detected.  I spent 15 minutes counseling the patient face to face. The total time spent in the appointment was 20 minutes and more than 50% was on counseling and review of test results  [name], MD 05/03/2019 2:39 PM

## 2019-05-06 ENCOUNTER — Other Ambulatory Visit: Payer: Self-pay | Admitting: Physical Medicine and Rehabilitation

## 2019-05-06 ENCOUNTER — Encounter: Payer: Self-pay | Admitting: Adult Health

## 2019-05-07 ENCOUNTER — Telehealth: Payer: Self-pay | Admitting: Hematology and Oncology

## 2019-05-07 ENCOUNTER — Ambulatory Visit: Payer: Medicare Other

## 2019-05-07 ENCOUNTER — Other Ambulatory Visit: Payer: Self-pay

## 2019-05-07 DIAGNOSIS — M25552 Pain in left hip: Secondary | ICD-10-CM | POA: Diagnosis not present

## 2019-05-07 DIAGNOSIS — Z8781 Personal history of (healed) traumatic fracture: Secondary | ICD-10-CM | POA: Diagnosis not present

## 2019-05-07 DIAGNOSIS — M6281 Muscle weakness (generalized): Secondary | ICD-10-CM | POA: Diagnosis not present

## 2019-05-07 DIAGNOSIS — R2689 Other abnormalities of gait and mobility: Secondary | ICD-10-CM | POA: Diagnosis not present

## 2019-05-07 NOTE — Telephone Encounter (Signed)
I talk with patient regarding schedule and she seen them on my chart

## 2019-05-07 NOTE — Patient Instructions (Signed)
Scar Massage  Scar massage is done to improve the mobility of scar, decrease scar tissue from building up, reduce adhesions, and prevent Keloids from forming. Start scar massage after scabs have fallen off by themselves and no open areas. The first few weeks after surgery, it is normal for a scar to appear pink or red and slightly raised. Scars can itch or have areas of numbness. Some scars may be sensitive.   Direct Scar massage: after scar is healed, no opening, no scab 1.  Place pads of two fingers together directly on the scar starting at one end of the scar. Move the fingers up and down across the scar holding 5 seconds one direction.  Then go opposite direction hold 5 seconds.  2. Move over to the next section of the scar and repeat.  Work your way along the entire length of the scar.   3. Next make diagonal movements along the scar holding 5 seconds at one direction. 4. Next movement is side to side. 5. Do not rub fingers over the scar.  Instead keep firm pressure and move scar over the tissue it is on top   Scar Lift and Roll 12 weeks after surgery. 1. Pinch a small amount of the scar between your first two fingers and thumb.  2. Roll the scar between your fingers for 5 to 15 seconds. 3. Move along the scar and repeat until you have massaged the entire length of scar.   Stop the massage and call your doctor if you notice: 1. Increased redness 2. Bleeding from scar 3. Seepage coming from the scar 4. Scar is warmer and has increased pain    

## 2019-05-07 NOTE — Therapy (Signed)
[facility] [address], [place], [place], [address] Phone: [phone]   Fax:  [phone]  Physical Therapy Treatment  Patient Details  Name: Brooke Washington MRN: [id number] Date of Birth: 1941/09/22 Referring Provider (PT): Reesa Chew [place]   Encounter Date: 05/07/2019  PT End of Session - 05/07/19 0921    Visit Number  2    Date for PT Re-Evaluation  06/27/19    Authorization Type  Medicare    PT Start Time  0830    PT Stop Time  0919    PT Time Calculation (min)  49 min    Activity Tolerance  Patient tolerated treatment well    Behavior During Therapy  [facility] for tasks assessed/performed       Past Medical History:  Diagnosis Date  . Cancer ([place])  APRIL OF 2000   LOW-GRADE NON-HODGKIN'S LYMPHOMA  . COPD (chronic obstructive pulmonary disease) ([place])   . Depression   . Emphysema of lung ([place])   . GERD (gastroesophageal reflux disease)   . Hyperlipidemia   . S/p left hip fracture 04/15/2019    Past Surgical History:  Procedure Laterality Date  . 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 FRACTURE SURGERY    . IR FLUORO GUIDE PORT INSERTION RIGHT  12/07/2017  . IR US GUIDE VASC ACCESS RIGHT  12/07/2017  . ORIF HUMERUS FRACTURE Right 11/23/2017   Procedure: OPEN REDUCTION INTERNAL FIXATION (ORIF) HUMERAL SHAFT FRACTURE;  Surgeon: Shona Needles, MD;  Location: [place];  Service: Orthopedics;  Laterality: Right;  . RIGHT OOPHORECTOMY  1976  . 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   Procedure: TOTAL HIP ARTHROPLASTY ANTERIOR APPROACH LEFT HIP;  Surgeon: Rod Can, MD;  Location: [place];  Service: Orthopedics;  Laterality: Left;    There were no vitals filed for this visit.  Subjective Assessment - 05/07/19 0829    Subjective  I'm doing ok.  Doing exercises at home.    Currently in Pain?  No/denies                        [facility] Adult PT Treatment/Exercise - 05/07/19 0001      Exercises   Exercises  Knee/Hip;Ankle      Knee/Hip Exercises: Stretches   Active Hamstring Stretch  Left;3 reps;20 seconds      Knee/Hip Exercises: Aerobic   Nustep  Level 1 x 8 minutes- attempted legs only- too much challenge      Knee/Hip Exercises: Standing   Heel Raises  Both;2 sets;10 reps    Hip Abduction  Stengthening;Both;2 sets;10 reps;Knee straight    Hip Extension  Stengthening;Both;2 sets;10 reps;Knee straight      Knee/Hip Exercises: Seated   Long Arc Quad  Strengthening;Both;2 sets;10 reps    Ball Squeeze  5" hold 2x10      Knee/Hip Exercises: Supine   Quad Sets  Strengthening;Left;10 reps      Manual Therapy   Manual Therapy  Soft tissue mobilization    Manual therapy comments  scar mobs over incision- taught pt how to do this at home             PT Education - 05/07/19 0916    Education Details  scar massage    Person(s) Educated  Patient    Methods  Explanation;Demonstration;Handout    Comprehension  Verbalized understanding  PT Short Term Goals - 05/02/19 1058      PT SHORT TERM GOAL #1   Title  understand tips to avoid falls    Period  Weeks    Status  New    Target Date  05/30/19      PT SHORT TERM GOAL #2   Title  independent with initial HEP    Time  4    Period  Weeks    Status  New    Target Date  05/30/19      PT SHORT TERM GOAL #3   Title  TUG score </= 15 seconds with rolling walker    Time  4    Period  Weeks    Status  New    Target Date  05/30/19      PT SHORT TERM GOAL #4   Title  pain in left hip decreased >/= 25% with movement of left hip    Time  4    Period  Weeks    Status  New    Target Date  05/30/19        PT Long Term Goals - 05/02/19 1100      PT LONG TERM GOAL #1   Title  independent with HEP and understand how to progress herself    Time  8    Period  Weeks    Status  New    Target Date  06/27/19       PT LONG TERM GOAL #2   Title  abilty to ambulate 6 minutes without a rest due to improved endurance and gait with least restrictive device    Time  8    Period  Weeks    Status  New    Target Date  06/27/19      PT LONG TERM GOAL #3   Title  abilty to go up and down her steps with step over step pattern due to increased bilateral lower extremity strength >/= 4+/5    Time  8    Period  Weeks    Status  New    Target Date  06/27/19      PT LONG TERM GOAL #4   Title  able to bring left leg onto the bed without assistance due to decreased left hip pain >/= 70% and increased strength    Time  8    Period  Weeks    Status  New    Target Date  06/27/19      PT LONG TERM GOAL #5   Title  TUG score </= 13 seconds to reduce her risk of falls in a public place without an assistive device due to improved balance    Time  8    Period  Weeks    Status  New    Target Date  06/27/19            Plan - 05/07/19 0844    Clinical Impression Statement  Pt has not been performing exercises issued at first PT session and reports that she has been walking a lot.  PT emphasized the importance of compliance with exercises for strength and endurance gains.  Pt demonstrates forward trunk posture in standing and requires frequent verbal cues to correct.  Pt also demonstrates hip external rotation with seated hip flexion on the Lt.  Pt will benefit from skilled PT s/p TKA to improve gait, strength and endurance for return to regular function.    Comorbidities  s/p  left hip anterior hip replacement 04/10/2019; s/p non Hodgkins Lymphoma; COPD    PT Frequency  2x / week    PT Duration  8 weeks    PT Treatment/Interventions  Cryotherapy;Electrical Stimulation;Moist Heat;Balance training;Therapeutic exercise;Therapeutic activities;Gait training;Stair training;Neuromuscular re-education;Patient/family education;Dry needling;Passive range of motion;Scar mobilization;Manual techniques    PT Next Visit Plan   soft tissue work to left thigh and scar, weight shifting on rebounder, nustep, knee extension strength and ROM against gravity, left hip strength, PF strength on left    PT Home Exercise Plan  Access Code: [email or web address]    Consulted and Agree with Plan of Care  Patient       Patient will benefit from skilled therapeutic intervention in order to improve the following deficits and impairments:  Abnormal gait, Decreased range of motion, Difficulty walking, Increased fascial restricitons, Decreased endurance, Increased muscle spasms, Decreased activity tolerance, Pain, Decreased balance, Decreased scar mobility, Impaired flexibility, Decreased strength, Decreased mobility  Visit Diagnosis: 1. Pain in left hip   2. Muscle weakness (generalized)   3. Other abnormalities of gait and mobility        Problem List Patient Active Problem List   Diagnosis Date Noted  . Hypomagnesemia   . Hypokalemia   . Prolonged Q-T interval on ECG   . Femur fracture, left ([place]) 04/15/2019  . Chronic obstructive pulmonary disease ([place])   . Acute blood loss anemia   . Drug induced constipation   . Status post total hip replacement, left   . Supplemental oxygen dependent   . Postoperative pain   . Closed left hip fracture, initial encounter ([place]) 04/10/2019  . Other fatigue 04/09/2019  . Leukopenia due to antineoplastic chemotherapy ([place]) 01/25/2019  . Bilateral arm pain 12/14/2018  . Pancytopenia, acquired ([place]) 06/20/2018  . Weight loss 06/20/2018  . Physical deconditioning 04/10/2018  . 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 Hypotension, unspecified 03/29/2018  . Chronic respiratory failure with hypoxia ([place]) 12/30/2017  . Other constipation 12/29/2017  . Neutropenic fever ([place]) 12/18/2017  . Thrombocytopenia ([place]) 12/18/2017  . Abdominal fullness 12/18/2017  . Acute urinary retention 12/18/2017  . Mucositis due to antineoplastic therapy 12/15/2017  . Heart burn 12/15/2017  .  Frequent headaches 12/15/2017  . 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 Goals of care, counseling/discussion 11/29/2017  . Pathological fracture in neoplastic disease, right humerus, initial encounter for fracture 11/24/2017  . Hyperuricemia 11/22/2017  . Pathological fracture due to neoplastic disease, initial encounter 11/21/2017  . COPD GOLD II 07/05/2017  . COLONIC POLYPS, HYPERPLASTIC, HX OF 05/28/2010  . 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 Depression 05/05/2007     Sigurd Sos, PT 05/07/19 9:24 AM  [facility] [address] [place], [place], [address] Phone: [phone]   Fax:  [phone]  Name: Brooke Washington MRN: [id number] Date of Birth: 11-Oct-1941

## 2019-05-09 ENCOUNTER — Ambulatory Visit: Payer: Medicare Other

## 2019-05-09 ENCOUNTER — Other Ambulatory Visit: Payer: Self-pay

## 2019-05-09 DIAGNOSIS — M6281 Muscle weakness (generalized): Secondary | ICD-10-CM

## 2019-05-09 DIAGNOSIS — R2689 Other abnormalities of gait and mobility: Secondary | ICD-10-CM

## 2019-05-09 DIAGNOSIS — Z8781 Personal history of (healed) traumatic fracture: Secondary | ICD-10-CM

## 2019-05-09 DIAGNOSIS — M25552 Pain in left hip: Secondary | ICD-10-CM | POA: Diagnosis not present

## 2019-05-09 NOTE — Therapy (Signed)
[facility] [address], [place], [place], [address] Phone: [phone]   Fax:  [phone]  Physical Therapy Treatment  Patient Details  Name: Brooke Washington MRN: [id number] Date of Birth: 1941-03-13 Referring Provider (PT): Reesa Chew [place]   Encounter Date: 05/09/2019  PT End of Session - 05/09/19 1613    Visit Number  3    Date for PT Re-Evaluation  06/27/19    Authorization Type  Medicare    PT Start Time  1525    PT Stop Time  1612    PT Time Calculation (min)  47 min    Activity Tolerance  Patient tolerated treatment well    Behavior During Therapy  [facility] for tasks assessed/performed       Past Medical History:  Diagnosis Date  . Cancer ([place])  APRIL OF 2000   LOW-GRADE NON-HODGKIN'S LYMPHOMA  . COPD (chronic obstructive pulmonary disease) ([place])   . Depression   . Emphysema of lung ([place])   . GERD (gastroesophageal reflux disease)   . Hyperlipidemia   . S/p left hip fracture 04/15/2019    Past Surgical History:  Procedure Laterality Date  . 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 FRACTURE SURGERY    . IR FLUORO GUIDE PORT INSERTION RIGHT  12/07/2017  . IR US GUIDE VASC ACCESS RIGHT  12/07/2017  . ORIF HUMERUS FRACTURE Right 11/23/2017   Procedure: OPEN REDUCTION INTERNAL FIXATION (ORIF) HUMERAL SHAFT FRACTURE;  Surgeon: Shona Needles, MD;  Location: [place];  Service: Orthopedics;  Laterality: Right;  . RIGHT OOPHORECTOMY  1976  . 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   Procedure: TOTAL HIP ARTHROPLASTY ANTERIOR APPROACH LEFT HIP;  Surgeon: Rod Can, MD;  Location: [place];  Service: Orthopedics;  Laterality: Left;    There were no vitals filed for this visit.                    [place] Adult PT Treatment/Exercise - 05/09/19 0001      Knee/Hip Exercises: Stretches   Active Hamstring Stretch  Left;3 reps;20  seconds      Knee/Hip Exercises: Aerobic   Nustep  Level 1 x 8 minutes- attempted legs only- too much challenge   PT present to monitor for fatigue     Knee/Hip Exercises: Standing   Heel Raises  Both;2 sets;10 reps    Hip Abduction  --    Hip Extension  --    Rocker Board  3 minutes    Other Standing Knee Exercises  mini squat x10    Other Standing Knee Exercises  weight shifting on foam pad x 1 minute      Knee/Hip Exercises: Seated   Long Arc Quad  Strengthening;Both;2 sets;10 reps    [facility]  5" hold 2x10      Knee/Hip Exercises: Supine   Short Arc Quad Sets  Strengthening;Left;2 sets;10 reps    Other Supine Knee/Hip Exercises  hip abduction with yellow band: 2x10      Manual Therapy   Manual Therapy  Soft tissue mobilization    Manual therapy comments  scar mobs over incision and gentle Lt quad mobilization               PT Short Term Goals - 05/02/19 1058      PT SHORT TERM GOAL #1   Title  understand tips to avoid falls  Period  Weeks    Status  New    Target Date  05/30/19      PT SHORT TERM GOAL #2   Title  independent with initial HEP    Time  4    Period  Weeks    Status  New    Target Date  05/30/19      PT SHORT TERM GOAL #3   Title  TUG score </= 15 seconds with rolling walker    Time  4    Period  Weeks    Status  New    Target Date  05/30/19      PT SHORT TERM GOAL #4   Title  pain in left hip decreased >/= 25% with movement of left hip    Time  4    Period  Weeks    Status  New    Target Date  05/30/19        PT Long Term Goals - 05/02/19 1100      PT LONG TERM GOAL #1   Title  independent with HEP and understand how to progress herself    Time  8    Period  Weeks    Status  New    Target Date  06/27/19      PT LONG TERM GOAL #2   Title  abilty to ambulate 6 minutes without a rest due to improved endurance and gait with least restrictive device    Time  8    Period  Weeks    Status  New    Target Date  06/27/19       PT LONG TERM GOAL #3   Title  abilty to go up and down her steps with step over step pattern due to increased bilateral lower extremity strength >/= 4+/5    Time  8    Period  Weeks    Status  New    Target Date  06/27/19      PT LONG TERM GOAL #4   Title  able to bring left leg onto the bed without assistance due to decreased left hip pain >/= 70% and increased strength    Time  8    Period  Weeks    Status  New    Target Date  06/27/19      PT LONG TERM GOAL #5   Title  TUG score </= 13 seconds to reduce her risk of falls in a public place without an assistive device due to improved balance    Time  8    Period  Weeks    Status  New    Target Date  06/27/19            Plan - 05/09/19 1549    Clinical Impression Statement  Pt was sore after last session so exercise remained low level in the clinic today.  PT was present to monitor for symptoms during treatment and provided verbal cues for technique.  Pt is not consistent with HEP and PT encouraged pt to improve her compliance to allow for increased strength and endurance.  Pt with improved scar mobility and reduced tenderness with mobs today. Pt will continue to benefit from skilled PT for strength, flexibility and gait s/p THA.    PT Frequency  2x / week    PT Duration  8 weeks    PT Treatment/Interventions  Cryotherapy;Electrical Stimulation;Moist Heat;Balance training;Therapeutic exercise;Therapeutic activities;Gait training;Stair training;Neuromuscular re-education;Patient/family education;Dry needling;Passive range of motion;Scar mobilization;Manual  techniques    PT Next Visit Plan  soft tissue to lt quad and scar, standing/proprioception, gentle progression of strength.    PT Home Exercise Plan  Access Code: [email or web address]    Consulted and Agree with Plan of Care  Patient       Patient will benefit from skilled therapeutic intervention in order to improve the following deficits and impairments:  Abnormal gait, Decreased  range of motion, Difficulty walking, Increased fascial restricitons, Decreased endurance, Increased muscle spasms, Decreased activity tolerance, Pain, Decreased balance, Decreased scar mobility, Impaired flexibility, Decreased strength, Decreased mobility  Visit Diagnosis: 1. Pain in left hip   2. Muscle weakness (generalized)   3. Other abnormalities of gait and mobility   4. S/p left hip fracture        Problem List Patient Active Problem List   Diagnosis Date Noted  . Hypomagnesemia   . Hypokalemia   . Prolonged Q-T interval on ECG   . Femur fracture, left ([place]) 04/15/2019  . Chronic obstructive pulmonary disease ([place])   . Acute blood loss anemia   . Drug induced constipation   . Status post total hip replacement, left   . Supplemental oxygen dependent   . Postoperative pain   . Closed left hip fracture, initial encounter ([place]) 04/10/2019  . Other fatigue 04/09/2019  . Leukopenia due to antineoplastic chemotherapy ([place]) 01/25/2019  . Bilateral arm pain 12/14/2018  . Pancytopenia, acquired ([place]) 06/20/2018  . Weight loss 06/20/2018  . Physical deconditioning 04/10/2018  . Peripheral neuropathy due to chemotherapy ([place]) 03/29/2018  . Hypotension, unspecified 03/29/2018  . Chronic respiratory failure with hypoxia ([place]) 12/30/2017  . Other constipation 12/29/2017  . Neutropenic fever ([place]) 12/18/2017  . Thrombocytopenia ([place]) 12/18/2017  . Abdominal fullness 12/18/2017  . Acute urinary retention 12/18/2017  . Mucositis due to antineoplastic therapy 12/15/2017  . Heart burn 12/15/2017  . Frequent headaches 12/15/2017  . 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 Goals of care, counseling/discussion 11/29/2017  . Pathological fracture in neoplastic disease, right humerus, initial encounter for fracture 11/24/2017  . Hyperuricemia 11/22/2017  . Pathological fracture due to neoplastic disease, initial encounter 11/21/2017  . COPD GOLD II 07/05/2017  . COLONIC  POLYPS, HYPERPLASTIC, HX OF 05/28/2010  . 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 Depression 05/05/2007    Sigurd Sos, PT 05/09/19 4:15 PM  [facility] [address] [place], [place], [address] Phone: [phone]   Fax:  [phone]  Name: KRISTINE CHAHAL MRN: [id number] Date of Birth: 1940-12-10

## 2019-05-14 ENCOUNTER — Other Ambulatory Visit: Payer: Self-pay

## 2019-05-15 ENCOUNTER — Ambulatory Visit: Payer: Medicare Other | Attending: Physical Medicine and Rehabilitation | Admitting: Physical Therapy

## 2019-05-15 ENCOUNTER — Encounter: Payer: Self-pay | Admitting: Physical Therapy

## 2019-05-15 ENCOUNTER — Other Ambulatory Visit: Payer: Self-pay

## 2019-05-15 DIAGNOSIS — M25552 Pain in left hip: Secondary | ICD-10-CM | POA: Insufficient documentation

## 2019-05-15 DIAGNOSIS — Z8781 Personal history of (healed) traumatic fracture: Secondary | ICD-10-CM | POA: Insufficient documentation

## 2019-05-15 DIAGNOSIS — M6281 Muscle weakness (generalized): Secondary | ICD-10-CM | POA: Diagnosis not present

## 2019-05-15 DIAGNOSIS — R2689 Other abnormalities of gait and mobility: Secondary | ICD-10-CM | POA: Insufficient documentation

## 2019-05-15 NOTE — Therapy (Signed)
[facility] [address] [place], [place], [address] Phone: [phone]   Fax:  [phone]  Physical Therapy Treatment  Patient Details  Name: Brooke Washington MRN: [id number] Date of Birth: [birth year] Referring Provider (PT): Reesa Chew [place]   Encounter Date: 05/15/2019  PT End of Session - 05/15/19 0957    Visit Number  4    Date for PT Re-Evaluation  06/27/19    Authorization Type  Medicare    PT Start Time  3785    PT Stop Time  1030    PT Time Calculation (min)  43 min    Activity Tolerance  Patient tolerated treatment well       Past Medical History:  Diagnosis Date  . Cancer ([place])  APRIL OF 2000   LOW-GRADE NON-HODGKIN'S LYMPHOMA  . COPD (chronic obstructive pulmonary disease) ([place])   . Depression   . Emphysema of lung ([place])   . GERD (gastroesophageal reflux disease)   . Hyperlipidemia   . S/p left hip fracture 04/15/2019    Past Surgical History:  Procedure Laterality Date  . 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 FRACTURE SURGERY    . IR FLUORO GUIDE PORT INSERTION RIGHT  12/07/2017  . IR US GUIDE VASC ACCESS RIGHT  12/07/2017  . ORIF HUMERUS FRACTURE Right 11/23/2017   Procedure: OPEN REDUCTION INTERNAL FIXATION (ORIF) HUMERAL SHAFT FRACTURE;  Surgeon: Shona Needles, MD;  Location: [facility];  Service: Orthopedics;  Laterality: Right;  . RIGHT OOPHORECTOMY  1976  . 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   Procedure: TOTAL HIP ARTHROPLASTY ANTERIOR APPROACH LEFT HIP;  Surgeon: Rod Can, MD;  Location: [facility];  Service: Orthopedics;  Laterality: Left;    There were no vitals filed for this visit.  Subjective Assessment - 05/15/19 0948    Subjective  I get stiff when I sit even 5 minutes. I walk some at home without my walker.  I use the bike at home every day for 16 to 20 minutes.  Usually sore in lateral hip after PT but  not too bad.    Pertinent History  COPD    Patient Stated Goals  be able to walk with less pain and no walker, improve balance; fully straighten the left knee    Currently in Pain?  Yes    Pain Score  3     Pain Location  Hip    Pain Orientation  Left    Pain Type  Acute pain    Aggravating Factors   lifting my lower leg up; stiff when I first get up                       [facility] Adult PT Treatment/Exercise - 05/15/19 0001      Knee/Hip Exercises: Stretches   Active Hamstring Stretch  Left;3 reps;20 seconds   seated     Knee/Hip Exercises: Aerobic   Nustep  L1 7 min (6 minutes with LEs only)    while discussing progress and status     Knee/Hip Exercises: Standing   Heel Raises  Both;2 sets;10 reps    Hip Abduction  AROM;Right;Left;10 reps;Knee straight    Gait Training  yellow band TKEs 10x    Other Standing Knee Exercises  step taps 10x with bil UE support     Other Standing Knee Exercises  weightshifting in tandem 1 min  to each side 1 hand support; weight shift side to side 1 minute no UE support      Knee/Hip Exercises: Seated   Long Arc Quad  Strengthening;Both;2 sets;10 reps    [employer]  5" hold 2x10    Clamshell with TheraBand  Yellow   20x   Other Seated Knee/Hip Exercises  floor slider left 30x     Sit to Sand  3 sets;5 reps;without UE support   tall mat table              PT Short Term Goals - 05/02/19 1058      PT SHORT TERM GOAL #1   Title  understand tips to avoid falls    Period  Weeks    Status  New    Target Date  05/30/19      PT SHORT TERM GOAL #2   Title  independent with initial HEP    Time  4    Period  Weeks    Status  New    Target Date  05/30/19      PT SHORT TERM GOAL #3   Title  TUG score </= 15 seconds with rolling walker    Time  4    Period  Weeks    Status  New    Target Date  05/30/19      PT SHORT TERM GOAL #4   Title  pain in left hip decreased >/= 25% with movement of left hip    Time  4     Period  Weeks    Status  New    Target Date  05/30/19        PT Long Term Goals - 05/02/19 1100      PT LONG TERM GOAL #1   Title  independent with HEP and understand how to progress herself    Time  8    Period  Weeks    Status  New    Target Date  06/27/19      PT LONG TERM GOAL #2   Title  abilty to ambulate 6 minutes without a rest due to improved endurance and gait with least restrictive device    Time  8    Period  Weeks    Status  New    Target Date  06/27/19      PT LONG TERM GOAL #3   Title  abilty to go up and down her steps with step over step pattern due to increased bilateral lower extremity strength >/= 4+/5    Time  8    Period  Weeks    Status  New    Target Date  06/27/19      PT LONG TERM GOAL #4   Title  able to bring left leg onto the bed without assistance due to decreased left hip pain >/= 70% and increased strength    Time  8    Period  Weeks    Status  New    Target Date  06/27/19      PT LONG TERM GOAL #5   Title  TUG score </= 13 seconds to reduce her risk of falls in a public place without an assistive device due to improved balance    Time  8    Period  Weeks    Status  New    Target Date  06/27/19            Plan - 05/15/19 1023  Clinical Impression Statement  The patient reports continued soreness in left anterior/lateral thigh that is not unusual for length of time and type of surgery she underwent.  She is able to progress with her exercise with overall fewer complaints of soreness or fatigue.  Improved activation of hip flexors although lacks terminal knee extension in sitting.  Therapist closely monitoring response with all treatment interventions and providing close supervision for safety while she performs standing ex's.    Comorbidities  s/p left hip anterior hip replacement 04/10/2019; s/p non Hodgkins Lymphoma; COPD    Examination-Activity Limitations  Bathing;Locomotion Level;Transfers;Bed  Mobility;Squat;Dressing;Stairs;Stand    Examination-Participation Restrictions  Meal Prep;Driving;Laundry;Shop    Stability/Clinical Decision Making  Evolving/Moderate complexity    Rehab Potential  Excellent    PT Frequency  2x / week    PT Duration  8 weeks    PT Treatment/Interventions  Cryotherapy;Electrical Stimulation;Moist Heat;Balance training;Therapeutic exercise;Therapeutic activities;Gait training;Stair training;Neuromuscular re-education;Patient/family education;Dry needling;Passive range of motion;Scar mobilization;Manual techniques    PT Next Visit Plan  soft tissue to lt quad and scar, standing/proprioception, gentle progression of strength,    standing TKEs with red band;  retro weight shifting with band;  add sit to stand to HEP    PT Home Exercise Plan  Access Code: [email or web address]       Patient will benefit from skilled therapeutic intervention in order to improve the following deficits and impairments:  Abnormal gait, Decreased range of motion, Difficulty walking, Increased fascial restricitons, Decreased endurance, Increased muscle spasms, Decreased activity tolerance, Pain, Decreased balance, Decreased scar mobility, Impaired flexibility, Decreased strength, Decreased mobility  Visit Diagnosis: 1. Pain in left hip   2. Muscle weakness (generalized)   3. Other abnormalities of gait and mobility   4. S/p left hip fracture        Problem List Patient Active Problem List   Diagnosis Date Noted  . Hypomagnesemia   . Hypokalemia   . Prolonged Q-T interval on ECG   . Femur fracture, left ([place]) 04/15/2019  . Chronic obstructive pulmonary disease ([place])   . Acute blood loss anemia   . Drug induced constipation   . Status post total hip replacement, left   . Supplemental oxygen dependent   . Postoperative pain   . Closed left hip fracture, initial encounter ([place]) 04/10/2019  . Other fatigue 04/09/2019  . Leukopenia due to antineoplastic chemotherapy ([place]) 01/25/2019  .  Bilateral arm pain 12/14/2018  . Pancytopenia, acquired ([place]) 06/20/2018  . Weight loss 06/20/2018  . Physical deconditioning 04/10/2018  . Peripheral neuropathy due to chemotherapy ([place]) 03/29/2018  . Hypotension, unspecified 03/29/2018  . Chronic respiratory failure with hypoxia ([place]) 12/30/2017  . Other constipation 12/29/2017  . Neutropenic fever ([place]) 12/18/2017  . Thrombocytopenia ([place]) 12/18/2017  . Abdominal fullness 12/18/2017  . Acute urinary retention 12/18/2017  . Mucositis due to antineoplastic therapy 12/15/2017  . Heart burn 12/15/2017  . Frequent headaches 12/15/2017  . 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 Goals of care, counseling/discussion 11/29/2017  . Pathological fracture in neoplastic disease, right humerus, initial encounter for fracture 11/24/2017  . Hyperuricemia 11/22/2017  . Pathological fracture due to neoplastic disease, initial encounter 11/21/2017  . COPD GOLD II 07/05/2017  . COLONIC POLYPS, HYPERPLASTIC, HX OF 05/28/2010  . 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 Depression 05/05/2007   Ruben Im, PT 05/15/19 10:39 AM Phone: [phone] Fax: [phone] Alvera Singh 05/15/2019, 10:39 AM  [facility] [address] [place]  [address], [place], [place], [address] Phone: [phone]   Fax:  [phone]  Name: ANTONETTE HENDRICKS MRN: [id number] Date of Birth: 1941-08-15

## 2019-05-17 ENCOUNTER — Ambulatory Visit: Payer: Medicare Other | Admitting: Physical Therapy

## 2019-05-17 ENCOUNTER — Encounter: Payer: Self-pay | Admitting: Physical Therapy

## 2019-05-17 ENCOUNTER — Other Ambulatory Visit: Payer: Self-pay

## 2019-05-17 DIAGNOSIS — Z8781 Personal history of (healed) traumatic fracture: Secondary | ICD-10-CM | POA: Diagnosis not present

## 2019-05-17 DIAGNOSIS — M6281 Muscle weakness (generalized): Secondary | ICD-10-CM

## 2019-05-17 DIAGNOSIS — M25552 Pain in left hip: Secondary | ICD-10-CM

## 2019-05-17 DIAGNOSIS — R2689 Other abnormalities of gait and mobility: Secondary | ICD-10-CM

## 2019-05-17 NOTE — Patient Instructions (Signed)
Access Code: [email or web address]  URL: [email or web address]  Date: 05/17/2019  Prepared by: Ruben Im   Exercises  Supine Heel Slides - 10 reps - 1 sets - 2x daily - 7x weekly  Supine Quadricep Sets - 10 reps - 1 sets - 5 sec hold - 2x daily - 7x weekly  Standing Heel Raise with Support - 10 reps - 1 sets - 2x daily - 7x weekly  Standing Hip Abduction with Counter Support - 10 reps - 1 sets - 2x daily - 7x weekly  Standing Hip Extension with Counter Support - 10 reps - 1 sets - 2x daily - 7x weekly  Seated Hamstring Stretch - 3 reps - 1 sets - 20 hold - 2x daily - 7x weekly  Seated Hip Adduction Isometrics with Ball - 10 reps - 2 sets - 5 hold - 2x daily - 7x weekly  Sit to Stand - 10 reps - 1 sets - 1x daily - 7x weekly

## 2019-05-17 NOTE — Therapy (Signed)
[facility] [address] [place], [place], [address] Phone: [phone]   Fax:  [phone]  Physical Therapy Treatment  Patient Details  Name: Brooke Washington MRN: [id number] Date of Birth: 09-24-1941 Referring Provider (PT): Reesa Chew [place]   Encounter Date: 05/17/2019  PT End of Session - 05/17/19 0906    Visit Number  5    Date for PT Re-Evaluation  06/27/19    Authorization Type  Medicare    PT Start Time  0856    PT Stop Time  0938    PT Time Calculation (min)  42 min    Activity Tolerance  Patient tolerated treatment well       Past Medical History:  Diagnosis Date  . Cancer ([place])  APRIL OF 2000   LOW-GRADE NON-HODGKIN'S LYMPHOMA  . COPD (chronic obstructive pulmonary disease) ([place])   . Depression   . Emphysema of lung ([place])   . GERD (gastroesophageal reflux disease)   . Hyperlipidemia   . S/p left hip fracture 04/15/2019    Past Surgical History:  Procedure Laterality Date  . 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 FRACTURE SURGERY    . IR FLUORO GUIDE PORT INSERTION RIGHT  12/07/2017  . IR US GUIDE VASC ACCESS RIGHT  12/07/2017  . ORIF HUMERUS FRACTURE Right 11/23/2017   Procedure: OPEN REDUCTION INTERNAL FIXATION (ORIF) HUMERAL SHAFT FRACTURE;  Surgeon: Shona Needles, MD;  Location: [facility];  Service: Orthopedics;  Laterality: Right;  . RIGHT OOPHORECTOMY  1976  . 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   Procedure: TOTAL HIP ARTHROPLASTY ANTERIOR APPROACH LEFT HIP;  Surgeon: Rod Can, MD;  Location: [facility];  Service: Orthopedics;  Laterality: Left;    There were no vitals filed for this visit.  Subjective Assessment - 05/17/19 0859    Subjective  My low back hurts when I stand in the kitchen and sometimes lying.  Not so much with sitting.  Noticed it more lately.  When I lose my balance it's usually in the backward  direction.  I can maneuver some at home without my walker.    Pertinent History  COPD    Patient Stated Goals  be able to walk with less pain and no walker, improve balance; fully straighten the left knee    Currently in Pain?  No/denies    Pain Score  0-No pain                       [facility] Adult PT Treatment/Exercise - 05/17/19 0001      Therapeutic Activites    ADL's  sit to stand, pulling    Other Therapeutic Activities  walking, standing, maneuvering left LE to simulate getting in/out of the car      Knee/Hip Exercises: Aerobic   Nustep  L1 10 min while discussing progress    3 minutes without UEs     Knee/Hip Exercises: Standing   Heel Raises  Both;10 reps    Rebounder  3 ways 1 minute each     Other Standing Knee Exercises  step taps 10x with bil UE support     Other Standing Knee Exercises  retro stepping holding green band handles 10x right/left       Knee/Hip Exercises: Seated   Long Arc Quad  AROM;Left;5 reps    [employer]  5" hold 2x10    Clamshell  with TheraBand  Yellow   20x   Other Seated Knee/Hip Exercises  push down on walker to activate transverse abdominus     Sit to Sand  2 sets;10 reps   from high table             PT Education - 05/17/19 0928    Education Details  Access Code: [email or web address] sit to stand from higher chair without UE assist    Person(s) Educated  Patient    Methods  Explanation;Demonstration;Handout    Comprehension  Returned demonstration;Verbalized understanding       PT Short Term Goals - 05/02/19 1058      PT SHORT TERM GOAL #1   Title  understand tips to avoid falls    Period  Weeks    Status  New    Target Date  05/30/19      PT SHORT TERM GOAL #2   Title  independent with initial HEP    Time  4    Period  Weeks    Status  New    Target Date  05/30/19      PT SHORT TERM GOAL #3   Title  TUG score </= 15 seconds with rolling walker    Time  4    Period  Weeks    Status  New    Target Date   05/30/19      PT SHORT TERM GOAL #4   Title  pain in left hip decreased >/= 25% with movement of left hip    Time  4    Period  Weeks    Status  New    Target Date  05/30/19        PT Long Term Goals - 05/02/19 1100      PT LONG TERM GOAL #1   Title  independent with HEP and understand how to progress herself    Time  8    Period  Weeks    Status  New    Target Date  06/27/19      PT LONG TERM GOAL #2   Title  abilty to ambulate 6 minutes without a rest due to improved endurance and gait with least restrictive device    Time  8    Period  Weeks    Status  New    Target Date  06/27/19      PT LONG TERM GOAL #3   Title  abilty to go up and down her steps with step over step pattern due to increased bilateral lower extremity strength >/= 4+/5    Time  8    Period  Weeks    Status  New    Target Date  06/27/19      PT LONG TERM GOAL #4   Title  able to bring left leg onto the bed without assistance due to decreased left hip pain >/= 70% and increased strength    Time  8    Period  Weeks    Status  New    Target Date  06/27/19      PT LONG TERM GOAL #5   Title  TUG score </= 13 seconds to reduce her risk of falls in a public place without an assistive device due to improved balance    Time  8    Period  Weeks    Status  New    Target Date  06/27/19  Plan - 05/17/19 0921    Clinical Impression Statement  The patient reports some LBP today so treatment modified to include some sitting ex's between standing ex's which eases her pain.  She requires UE assist when weight shifting in the posterior direction most likely due to her ankle dorsiflexor weakness from peripheral neuropathy.  Therapist providing CGA for safety when challenging her balance in this direction.  Improving quad and hip flexor activation against gravity.    Comorbidities  s/p left hip anterior hip replacement 04/10/2019; s/p non Hodgkins Lymphoma; COPD    PT Home Exercise Plan  Access Code:  [email or web address]       Patient will benefit from skilled therapeutic intervention in order to improve the following deficits and impairments:     Visit Diagnosis: 1. Pain in left hip   2. Muscle weakness (generalized)   3. Other abnormalities of gait and mobility   4. S/p left hip fracture        Problem List Patient Active Problem List   Diagnosis Date Noted  . Hypomagnesemia   . Hypokalemia   . Prolonged Q-T interval on ECG   . Femur fracture, left ([place]) 04/15/2019  . Chronic obstructive pulmonary disease ([place])   . Acute blood loss anemia   . Drug induced constipation   . Status post total hip replacement, left   . Supplemental oxygen dependent   . Postoperative pain   . Closed left hip fracture, initial encounter ([place]) 04/10/2019  . Other fatigue 04/09/2019  . Leukopenia due to antineoplastic chemotherapy ([place]) 01/25/2019  . Bilateral arm pain 12/14/2018  . Pancytopenia, acquired ([place]) 06/20/2018  . Weight loss 06/20/2018  . Physical deconditioning 04/10/2018  . Peripheral neuropathy due to chemotherapy ([place]) 03/29/2018  . Hypotension, unspecified 03/29/2018  . Chronic respiratory failure with hypoxia ([place]) 12/30/2017  . Other constipation 12/29/2017  . Neutropenic fever ([place]) 12/18/2017  . Thrombocytopenia ([place]) 12/18/2017  . Abdominal fullness 12/18/2017  . Acute urinary retention 12/18/2017  . Mucositis due to antineoplastic therapy 12/15/2017  . Heart burn 12/15/2017  . Frequent headaches 12/15/2017  . 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 Goals of care, counseling/discussion 11/29/2017  . Pathological fracture in neoplastic disease, right humerus, initial encounter for fracture 11/24/2017  . Hyperuricemia 11/22/2017  . Pathological fracture due to neoplastic disease, initial encounter 11/21/2017  . COPD GOLD II 07/05/2017  . COLONIC POLYPS, HYPERPLASTIC, HX OF 05/28/2010  . 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 Depression  05/05/2007   Ruben Im, PT 05/17/19 9:49 AM Phone: [phone] Fax: [phone] Alvera Singh 05/17/2019, 9:49 AM  [facility] [address], [place], [place], [address] Phone: [phone]   Fax:  [phone]  Name: NEIDY GUERRIERI MRN: [id number] Date of Birth: [birth year]

## 2019-05-22 ENCOUNTER — Other Ambulatory Visit: Payer: Self-pay

## 2019-05-22 ENCOUNTER — Ambulatory Visit: Payer: Medicare Other | Admitting: Physical Therapy

## 2019-05-22 DIAGNOSIS — M25552 Pain in left hip: Secondary | ICD-10-CM

## 2019-05-22 DIAGNOSIS — R2689 Other abnormalities of gait and mobility: Secondary | ICD-10-CM

## 2019-05-22 DIAGNOSIS — Z8781 Personal history of (healed) traumatic fracture: Secondary | ICD-10-CM | POA: Diagnosis not present

## 2019-05-22 DIAGNOSIS — M6281 Muscle weakness (generalized): Secondary | ICD-10-CM | POA: Diagnosis not present

## 2019-05-22 NOTE — Therapy (Signed)
[facility] [address] [place], [place], [address] Phone: [phone]   Fax:  [phone]  Physical Therapy Treatment  Patient Details  Name: ADRYANNA FRIEDT MRN: [id number] Date of Birth: 10/01/1941 Referring Provider (PT): Reesa Chew [place]   Encounter Date: 05/22/2019  PT End of Session - 05/22/19 0906    Visit Number  6    Date for PT Re-Evaluation  06/27/19    Authorization Type  Medicare    PT Start Time  0856    PT Stop Time  0938    PT Time Calculation (min)  42 min    Activity Tolerance  Patient tolerated treatment well       Past Medical History:  Diagnosis Date  . Cancer ([place])  APRIL OF 2000   LOW-GRADE NON-HODGKIN'S LYMPHOMA  . COPD (chronic obstructive pulmonary disease) ([place])   . Depression   . Emphysema of lung ([place])   . GERD (gastroesophageal reflux disease)   . Hyperlipidemia   . S/p left hip fracture 04/15/2019    Past Surgical History:  Procedure Laterality Date  . 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 FRACTURE SURGERY    . IR FLUORO GUIDE PORT INSERTION RIGHT  12/07/2017  . IR US GUIDE VASC ACCESS RIGHT  12/07/2017  . ORIF HUMERUS FRACTURE Right 11/23/2017   Procedure: OPEN REDUCTION INTERNAL FIXATION (ORIF) HUMERAL SHAFT FRACTURE;  Surgeon: Shona Needles, MD;  Location: [place];  Service: Orthopedics;  Laterality: Right;  . RIGHT OOPHORECTOMY  1976  . 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   Procedure: TOTAL HIP ARTHROPLASTY ANTERIOR APPROACH LEFT HIP;  Surgeon: Rod Can, MD;  Location: [place];  Service: Orthopedics;  Laterality: Left;    There were no vitals filed for this visit.  Subjective Assessment - 05/22/19 0857    Subjective  My front thigh hurt a lot yesterday but I rode the bike and that made it feel better.  It feels fine today.    Patient Stated Goals  be able to walk with less pain and no  walker, improve balance; fully straighten the left knee    Currently in Pain?  No/denies    Pain Score  0-No pain    Pain Location  Hip    Pain Orientation  Left         [facility] PT Assessment - 05/22/19 0001      Timed Up and Go Test   Normal TUG (seconds)  15   RW                  [facility] Adult PT Treatment/Exercise - 05/22/19 0001      Therapeutic Activites    ADL's  sit to stand, pulling    Other Therapeutic Activities  walking, standing, maneuvering left LE to simulate getting in/out of the car      Knee/Hip Exercises: Stretches   Hip Flexor Stretch Limitations  2nd step hip flexor stretch right/left 10x each     Other Knee/Hip Stretches  slant board rocking 10x right/left       Knee/Hip Exercises: Aerobic   Nustep  L2 4 min, 1 min rest then 1 more minute  majority of time without UE use   while discussing status/progress     Knee/Hip Exercises: Standing   Hip Extension  Stengthening;Right;Left;10 reps    Extension Limitations  yellow band     Gait Training  hip hinge 30 degrees with golf club for 3 point contact     Other Standing Knee Exercises  step taps 1 minute     Other Standing Knee Exercises  retro stepping holding green band handles 10x right/left       Knee/Hip Exercises: Seated   Long Arc Quad  Strengthening;Left;2 sets;10 reps    Long Arc Quad Weight  2 lbs.    Other Seated Knee/Hip Exercises  hip hinge with sit to stand 5x     Sit to Sand  2 sets;5 reps;without UE support   medium height table               PT Short Term Goals - 05/02/19 1058      PT SHORT TERM GOAL #1   Title  understand tips to avoid falls    Period  Weeks    Status  New    Target Date  05/30/19      PT SHORT TERM GOAL #2   Title  independent with initial HEP    Time  4    Period  Weeks    Status  New    Target Date  05/30/19      PT SHORT TERM GOAL #3   Title  TUG score </= 15 seconds with rolling walker    Time  4    Period  Weeks    Status  New     Target Date  05/30/19      PT SHORT TERM GOAL #4   Title  pain in left hip decreased >/= 25% with movement of left hip    Time  4    Period  Weeks    Status  New    Target Date  05/30/19        PT Long Term Goals - 05/02/19 1100      PT LONG TERM GOAL #1   Title  independent with HEP and understand how to progress herself    Time  8    Period  Weeks    Status  New    Target Date  06/27/19      PT LONG TERM GOAL #2   Title  abilty to ambulate 6 minutes without a rest due to improved endurance and gait with least restrictive device    Time  8    Period  Weeks    Status  New    Target Date  06/27/19      PT LONG TERM GOAL #3   Title  abilty to go up and down her steps with step over step pattern due to increased bilateral lower extremity strength >/= 4+/5    Time  8    Period  Weeks    Status  New    Target Date  06/27/19      PT LONG TERM GOAL #4   Title  able to bring left leg onto the bed without assistance due to decreased left hip pain >/= 70% and increased strength    Time  8    Period  Weeks    Status  New    Target Date  06/27/19      PT LONG TERM GOAL #5   Title  TUG score </= 13 seconds to reduce her risk of falls in a public place without an assistive device due to improved balance    Time  8    Period  Weeks    Status  New  Target Date  06/27/19            Plan - 05/22/19 0906    Clinical Impression Statement  Patient has much improved gait speed using her RW with an 11 sec improvement in Timed up and go score from  7/22.  She continues to need the RW for safety secondary to decreased balance in the posterior direction.  Patient has poor activation of anterior tibialis muscles which is needed for ankle dorsiflexion and recovery with loss of balance in that direction.  She needs CGA/min assist with retro weight shifting.  She has LBP with sit to stand but relieved with hip hinge method.   The patient requires 30 sec rest breaks secondary to  difficulty breathing in a mask and COPD.  Therapist modifying for pain and excessive fatigue.    Comorbidities  s/p left hip anterior hip replacement 04/10/2019; s/p non Hodgkins Lymphoma; COPD    Examination-Activity Limitations  Bathing;Locomotion Level;Transfers;Bed Mobility;Squat;Dressing;Stairs;Stand    Examination-Participation Restrictions  Meal Prep;Driving;Laundry;Shop    Rehab Potential  Excellent    PT Frequency  2x / week    PT Duration  8 weeks    PT Treatment/Interventions  Cryotherapy;Electrical Stimulation;Moist Heat;Balance training;Therapeutic exercise;Therapeutic activities;Gait training;Stair training;Neuromuscular re-education;Patient/family education;Dry needling;Passive range of motion;Scar mobilization;Manual techniques    PT Next Visit Plan  check STGs;poprioception gentle progression of strength;        retro weight shifting with band;  sit to stand;  step ups    PT Home Exercise Plan  Access Code: [email or web address]       Patient will benefit from skilled therapeutic intervention in order to improve the following deficits and impairments:  Abnormal gait, Decreased range of motion, Difficulty walking, Increased fascial restricitons, Decreased endurance, Increased muscle spasms, Decreased activity tolerance, Pain, Decreased balance, Decreased scar mobility, Impaired flexibility, Decreased strength, Decreased mobility  Visit Diagnosis: 1. Pain in left hip   2. Muscle weakness (generalized)   3. Other abnormalities of gait and mobility   4. S/p left hip fracture        Problem List Patient Active Problem List   Diagnosis Date Noted  . Hypomagnesemia   . Hypokalemia   . Prolonged Q-T interval on ECG   . Femur fracture, left ([place]) 04/15/2019  . Chronic obstructive pulmonary disease ([place])   . Acute blood loss anemia   . Drug induced constipation   . Status post total hip replacement, left   . Supplemental oxygen dependent   . Postoperative pain   . Closed left hip  fracture, initial encounter ([place]) 04/10/2019  . Other fatigue 04/09/2019  . Leukopenia due to antineoplastic chemotherapy ([place]) 01/25/2019  . Bilateral arm pain 12/14/2018  . Pancytopenia, acquired ([place]) 06/20/2018  . Weight loss 06/20/2018  . Physical deconditioning 04/10/2018  . Peripheral neuropathy due to chemotherapy ([place]) 03/29/2018  . Hypotension, unspecified 03/29/2018  . Chronic respiratory failure with hypoxia ([place]) 12/30/2017  . Other constipation 12/29/2017  . Neutropenic fever ([place]) 12/18/2017  . Thrombocytopenia ([place]) 12/18/2017  . Abdominal fullness 12/18/2017  . Acute urinary retention 12/18/2017  . Mucositis due to antineoplastic therapy 12/15/2017  . Heart burn 12/15/2017  . Frequent headaches 12/15/2017  . 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 Goals of care, counseling/discussion 11/29/2017  . Pathological fracture in neoplastic disease, right humerus, initial encounter for fracture 11/24/2017  . Hyperuricemia 11/22/2017  . Pathological fracture due to neoplastic disease, initial encounter 11/21/2017  . COPD GOLD II 07/05/2017  . COLONIC POLYPS,  HYPERPLASTIC, HX OF 05/28/2010  . 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 Depression 05/05/2007   Ruben Im, PT 05/22/19 9:56 AM Phone: [phone] Fax: [phone]  Alvera Singh 05/22/2019, 9:56 AM  [facility] [address] [place], [place], [address] Phone: [phone]   Fax:  [phone]  Name: SOLOMIA HARRELL MRN: [id number] Date of Birth: 1941/01/23

## 2019-05-24 ENCOUNTER — Other Ambulatory Visit: Payer: Self-pay

## 2019-05-24 ENCOUNTER — Ambulatory Visit: Payer: Medicare Other | Admitting: Physical Therapy

## 2019-05-24 ENCOUNTER — Encounter: Payer: Self-pay | Admitting: Physical Therapy

## 2019-05-24 DIAGNOSIS — M6281 Muscle weakness (generalized): Secondary | ICD-10-CM | POA: Diagnosis not present

## 2019-05-24 DIAGNOSIS — Z8781 Personal history of (healed) traumatic fracture: Secondary | ICD-10-CM

## 2019-05-24 DIAGNOSIS — M25552 Pain in left hip: Secondary | ICD-10-CM

## 2019-05-24 DIAGNOSIS — R2689 Other abnormalities of gait and mobility: Secondary | ICD-10-CM

## 2019-05-24 NOTE — Therapy (Signed)
[facility] [address] [place], [place], [address] Phone: [phone]   Fax:  [phone]  Physical Therapy Treatment  Patient Details  Name: Brooke Washington MRN: [id number] Date of Birth: July 01, 1941 Referring Provider (PT): Reesa Chew [place]   Encounter Date: 05/24/2019  PT End of Session - 05/24/19 0902    Visit Number  7    Date for PT Re-Evaluation  06/27/19    Authorization Type  Medicare    PT Start Time  4270    PT Stop Time  0935    PT Time Calculation (min)  40 min    Activity Tolerance  Patient tolerated treatment well       Past Medical History:  Diagnosis Date  . Cancer ([place])  APRIL OF 2000   LOW-GRADE NON-HODGKIN'S LYMPHOMA  . COPD (chronic obstructive pulmonary disease) ([place])   . Depression   . Emphysema of lung ([place])   . GERD (gastroesophageal reflux disease)   . Hyperlipidemia   . S/p left hip fracture 04/15/2019    Past Surgical History:  Procedure Laterality Date  . 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 FRACTURE SURGERY    . IR FLUORO GUIDE PORT INSERTION RIGHT  12/07/2017  . IR US GUIDE VASC ACCESS RIGHT  12/07/2017  . ORIF HUMERUS FRACTURE Right 11/23/2017   Procedure: OPEN REDUCTION INTERNAL FIXATION (ORIF) HUMERAL SHAFT FRACTURE;  Surgeon: Shona Needles, MD;  Location: [place];  Service: Orthopedics;  Laterality: Right;  . RIGHT OOPHORECTOMY  1976  . 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   Procedure: TOTAL HIP ARTHROPLASTY ANTERIOR APPROACH LEFT HIP;  Surgeon: Rod Can, MD;  Location: [place];  Service: Orthopedics;  Laterality: Left;    There were no vitals filed for this visit.  Subjective Assessment - 05/24/19 0857    Subjective  I'm stiff in the mornings.  I use the walker in the mornings and to help do the curb out front.    Pertinent History  COPD     Currently in Pain?  No/denies not painful at  rest.   Pain Score  0-No pain    Pain Orientation  Left    Pain Type  Acute pain                       [facility] Adult PT Treatment/Exercise - 05/24/19 0001      Ambulation/Gait   Gait Comments  1 minute walk without RW per patient's request with close supervision for safety       Therapeutic Activites    ADL's  sit to stand    Other Therapeutic Activities  walking, standing, maneuvering left LE to simulate getting in/out of the car      Knee/Hip Exercises: Stretches   Hip Flexor Stretch Limitations  2nd step hip flexor stretch right/left 10x each       Knee/Hip Exercises: Aerobic   Nustep  L2 1 minute intervals with 30 sec rest in between 5 rounds   therapist assessing response to treatment     Knee/Hip Exercises: Standing   Forward Step Up  Left;2 sets;Hand Hold: 2;Step Height: 2"    Forward Step Up Limitations  1 minute     Rocker Board  2 minutes    Rocker Board Limitations  bil UE support     Other Standing Knee Exercises  step taps 1 minute  Other Standing Knee Exercises  sidestepping with UE support 1 minute       Knee/Hip Exercises: Seated   Long Arc Quad  Strengthening;Right;Left;3 sets    [employer] Weight  2 lbs.    Other Seated Knee/Hip Exercises  chair sit ups 1 minute     Other Seated Knee/Hip Exercises  foam roll push downs 1 minute     Sit to Sand  10 reps;with UE support   black foam in standard chair 1 minute              PT Short Term Goals - 05/24/19 0932      PT SHORT TERM GOAL #1   Title  understand tips to avoid falls    Status  Achieved      PT SHORT TERM GOAL #2   Title  independent with initial HEP    Status  Achieved      PT SHORT TERM GOAL #3   Title  TUG score </= 15 seconds with rolling walker    Time  --    Period  --    Status  Achieved      PT SHORT TERM GOAL #4   Title  pain in left hip decreased >/= 25% with movement of left hip    Status  Achieved        PT Long Term Goals - 05/02/19 1100       PT LONG TERM GOAL #1   Title  independent with HEP and understand how to progress herself    Time  8    Period  Weeks    Status  New    Target Date  06/27/19      PT LONG TERM GOAL #2   Title  abilty to ambulate 6 minutes without a rest due to improved endurance and gait with least restrictive device    Time  8    Period  Weeks    Status  New    Target Date  06/27/19      PT LONG TERM GOAL #3   Title  abilty to go up and down her steps with step over step pattern due to increased bilateral lower extremity strength >/= 4+/5    Time  8    Period  Weeks    Status  New    Target Date  06/27/19      PT LONG TERM GOAL #4   Title  able to bring left leg onto the bed without assistance due to decreased left hip pain >/= 70% and increased strength    Time  8    Period  Weeks    Status  New    Target Date  06/27/19      PT LONG TERM GOAL #5   Title  TUG score </= 13 seconds to reduce her risk of falls in a public place without an assistive device due to improved balance    Time  8    Period  Weeks    Status  New    Target Date  06/27/19            Plan - 05/24/19 0910    Clinical Impression Statement  The patient continues to complain of right anterior thigh soreness which limits her ability to flex her  hip to ascend steps.  She reports her pain level is a 5/10 post session but overall her pain level has improved by 30%.  She is able to  ambulate on level indoor surface without assistive device but close supervision for safety.  Neuropathy affects ankle dorsiflexion strength needed for balance especially on uneven terrain.  She has met all STGS.  Therapist closely monitioring pain level and supervising for safety due to fall risk.    Comorbidities  s/p left hip anterior hip replacement 04/10/2019; s/p non Hodgkins Lymphoma; COPD    Examination-Activity Limitations  Bathing;Locomotion Level;Transfers;Bed Mobility;Squat;Dressing;Stairs;Stand    Examination-Participation  Restrictions  Meal Prep;Driving;Laundry;Shop    PT Frequency  2x / week    PT Duration  8 weeks    PT Treatment/Interventions  Cryotherapy;Electrical Stimulation;Moist Heat;Balance training;Therapeutic exercise;Therapeutic activities;Gait training;Stair training;Neuromuscular re-education;Patient/family education;Dry needling;Passive range of motion;Scar mobilization;Manual techniques    PT Next Visit Plan  begin walking timed laps in gym ;poprioception gentle progression of strength;    retro weight shifting with band;  sit to stand;  step ups try 4 inch;  increase LAQ to 3#    PT Home Exercise Plan  Access Code: [email or web address]       Patient will benefit from skilled therapeutic intervention in order to improve the following deficits and impairments:  Abnormal gait, Decreased range of motion, Difficulty walking, Increased fascial restricitons, Decreased endurance, Increased muscle spasms, Decreased activity tolerance, Pain, Decreased balance, Decreased scar mobility, Impaired flexibility, Decreased strength, Decreased mobility  Visit Diagnosis: 1. Pain in left hip   2. Muscle weakness (generalized)   3. Other abnormalities of gait and mobility   4. S/p left hip fracture        Problem List Patient Active Problem List   Diagnosis Date Noted  . Hypomagnesemia   . Hypokalemia   . Prolonged Q-T interval on ECG   . Femur fracture, left ([place]) 04/15/2019  . Chronic obstructive pulmonary disease ([place])   . Acute blood loss anemia   . Drug induced constipation   . Status post total hip replacement, left   . Supplemental oxygen dependent   . Postoperative pain   . Closed left hip fracture, initial encounter ([place]) 04/10/2019  . Other fatigue 04/09/2019  . Leukopenia due to antineoplastic chemotherapy ([place]) 01/25/2019  . Bilateral arm pain 12/14/2018  . Pancytopenia, acquired ([place]) 06/20/2018  . Weight loss 06/20/2018  . Physical deconditioning 04/10/2018  . Peripheral neuropathy due to  chemotherapy ([place]) 03/29/2018  . Hypotension, unspecified 03/29/2018  . Chronic respiratory failure with hypoxia ([place]) 12/30/2017  . Other constipation 12/29/2017  . Neutropenic fever ([place]) 12/18/2017  . Thrombocytopenia ([place]) 12/18/2017  . Abdominal fullness 12/18/2017  . Acute urinary retention 12/18/2017  . Mucositis due to antineoplastic therapy 12/15/2017  . Heart burn 12/15/2017  . Frequent headaches 12/15/2017  . 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 Goals of care, counseling/discussion 11/29/2017  . Pathological fracture in neoplastic disease, right humerus, initial encounter for fracture 11/24/2017  . Hyperuricemia 11/22/2017  . Pathological fracture due to neoplastic disease, initial encounter 11/21/2017  . COPD GOLD II 07/05/2017  . COLONIC POLYPS, HYPERPLASTIC, HX OF 05/28/2010  . 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 Depression 05/05/2007   Ruben Im, PT 05/24/19 9:52 AM Phone: [phone] Fax: [phone] Alvera Singh 05/24/2019, 9:51 AM  [facility] [address] [place], [place], [address] Phone: [phone]   Fax:  [phone]  Name: Brooke Washington MRN: [id number] Date of Birth: [birth year]

## 2019-05-28 DIAGNOSIS — S72032D Displaced midcervical fracture of left femur, subsequent encounter for closed fracture with routine healing: Secondary | ICD-10-CM | POA: Diagnosis not present

## 2019-05-29 ENCOUNTER — Ambulatory Visit: Payer: Medicare Other | Admitting: Physical Therapy

## 2019-05-29 ENCOUNTER — Other Ambulatory Visit: Payer: Self-pay

## 2019-05-29 ENCOUNTER — Encounter: Payer: Self-pay | Admitting: Physical Therapy

## 2019-05-29 DIAGNOSIS — M25552 Pain in left hip: Secondary | ICD-10-CM

## 2019-05-29 DIAGNOSIS — M6281 Muscle weakness (generalized): Secondary | ICD-10-CM | POA: Diagnosis not present

## 2019-05-29 DIAGNOSIS — Z8781 Personal history of (healed) traumatic fracture: Secondary | ICD-10-CM

## 2019-05-29 DIAGNOSIS — R2689 Other abnormalities of gait and mobility: Secondary | ICD-10-CM

## 2019-05-29 NOTE — Therapy (Signed)
[facility] [address], [place], [place], [address] Phone: [phone]   Fax:  [phone]  Physical Therapy Treatment  Patient Details  Name: Brooke Washington MRN: [id number] Date of Birth: 03/25/1941 Referring Provider (PT): Reesa Chew [place]   Encounter Date: 05/29/2019  PT End of Session - 05/29/19 0904    Visit Number  8    Date for PT Re-Evaluation  06/27/19    Authorization Type  Medicare    PT Start Time  0855    PT Stop Time  0935    PT Time Calculation (min)  40 min    Activity Tolerance  Patient tolerated treatment well       Past Medical History:  Diagnosis Date  . Cancer ([place])  APRIL OF 2000   LOW-GRADE NON-HODGKIN'S LYMPHOMA  . COPD (chronic obstructive pulmonary disease) ([place])   . Depression   . Emphysema of lung ([place])   . GERD (gastroesophageal reflux disease)   . Hyperlipidemia   . S/p left hip fracture 04/15/2019    Past Surgical History:  Procedure Laterality Date  . 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 FRACTURE SURGERY    . IR FLUORO GUIDE PORT INSERTION RIGHT  12/07/2017  . IR US GUIDE VASC ACCESS RIGHT  12/07/2017  . ORIF HUMERUS FRACTURE Right 11/23/2017   Procedure: OPEN REDUCTION INTERNAL FIXATION (ORIF) HUMERAL SHAFT FRACTURE;  Surgeon: Shona Needles, MD;  Location: [place];  Service: Orthopedics;  Laterality: Right;  . RIGHT OOPHORECTOMY  1976  . 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   Procedure: TOTAL HIP ARTHROPLASTY ANTERIOR APPROACH LEFT HIP;  Surgeon: Rod Can, MD;  Location: [place];  Service: Orthopedics;  Laterality: Left;    There were no vitals filed for this visit.  Subjective Assessment - 05/29/19 0858    Subjective  I saw the surgeon yesterday and he did x-rays.  Said everything looked good.  I don't go back until December.  Just sore and stiff left anterior thigh.    Pertinent History   COPD    Patient Stated Goals  be able to walk with less pain and no walker, improve balance; fully straighten the left knee    Currently in Pain?  No/denies    Pain Score  0-No pain    Pain Orientation  Left    Pain Type  Acute pain                       [facility] Adult PT Treatment/Exercise - 05/29/19 0001      Ambulation/Gait   Gait Comments  2 minute walk without RW       Therapeutic Activites    ADL's  sit to stand    Other Therapeutic Activities  walking, standing, maneuvering left LE to simulate getting in/out of the car      Knee/Hip Exercises: Stretches   Hip Flexor Stretch Limitations  hip flexor stretch on step 1 minute each    Other Knee/Hip Stretches  --      Knee/Hip Exercises: Aerobic   Nustep  L2 1 1/2  minute intervals with 30 sec rest in between 4 rounds   therapist assessing response to treatment     Knee/Hip Exercises: Standing   Forward Step Up  Right;Left;Hand Hold: 2;Step Height: 4"    Forward Step Up Limitations  2 sets of 30 sec  Rocker Board  2 minutes    Rocker Board Limitations  bil UE support     Other Standing Knee Exercises  step taps 1 minute     Other Standing Knee Exercises  sidestepping with UE support 1 minute       Knee/Hip Exercises: Seated   Long Arc Quad  Strengthening;Left;2 sets;10 reps    Long Arc Quad Weight  3 lbs.    Other Seated Knee/Hip Exercises  high table seated marching 1 minute     Sit to Sand  2 sets;5 reps   very high mat table holding 5#               PT Short Term Goals - 05/24/19 0932      PT SHORT TERM GOAL #1   Title  understand tips to avoid falls    Status  Achieved      PT SHORT TERM GOAL #2   Title  independent with initial HEP    Status  Achieved      PT SHORT TERM GOAL #3   Title  TUG score </= 15 seconds with rolling walker    Time  --    Period  --    Status  Achieved      PT SHORT TERM GOAL #4   Title  pain in left hip decreased >/= 25% with movement of left hip     Status  Achieved        PT Long Term Goals - 05/02/19 1100      PT LONG TERM GOAL #1   Title  independent with HEP and understand how to progress herself    Time  8    Period  Weeks    Status  New    Target Date  06/27/19      PT LONG TERM GOAL #2   Title  abilty to ambulate 6 minutes without a rest due to improved endurance and gait with least restrictive device    Time  8    Period  Weeks    Status  New    Target Date  06/27/19      PT LONG TERM GOAL #3   Title  abilty to go up and down her steps with step over step pattern due to increased bilateral lower extremity strength >/= 4+/5    Time  8    Period  Weeks    Status  New    Target Date  06/27/19      PT LONG TERM GOAL #4   Title  able to bring left leg onto the bed without assistance due to decreased left hip pain >/= 70% and increased strength    Time  8    Period  Weeks    Status  New    Target Date  06/27/19      PT LONG TERM GOAL #5   Title  TUG score </= 13 seconds to reduce her risk of falls in a public place without an assistive device due to improved balance    Time  8    Period  Weeks    Status  New    Target Date  06/27/19            Plan - 05/29/19 0914    Clinical Impression Statement  The patient reports imtermittent left anterior thigh soreness but primary complaint is shortness of breath secondary to mask wearing/COPD.  She also states she forgot to take her breathing  medication this morning.  She is able to progress intensity of exercise time and resistance.  She reports she is 50-60% better overall.  Therapist closely monitoring response including respiratory and pain as well as providing close supervison for safety.    Comorbidities  s/p left hip anterior hip replacement 04/10/2019; s/p non Hodgkins Lymphoma; COPD    Examination-Activity Limitations  Bathing;Locomotion Level;Transfers;Bed Mobility;Squat;Dressing;Stairs;Stand    Stability/Clinical Decision Making  Evolving/Moderate  complexity    Rehab Potential  Excellent    PT Frequency  2x / week    PT Duration  8 weeks    PT Treatment/Interventions  Cryotherapy;Electrical Stimulation;Moist Heat;Balance training;Therapeutic exercise;Therapeutic activities;Gait training;Stair training;Neuromuscular re-education;Patient/family education;Dry needling;Passive range of motion;Scar mobilization;Manual techniques    PT Next Visit Plan  begin walking timed laps in gym ;  try carrying weighted object  ;poprioception gentle progression of strength;    retro weight shifting with band;  sit to stand;  step ups 4 inch;   LAQ to 3#;  reciprocal  stair climbing    PT Home Exercise Plan  Access Code: [email or web address]       Patient will benefit from skilled therapeutic intervention in order to improve the following deficits and impairments:  Abnormal gait, Decreased range of motion, Difficulty walking, Increased fascial restricitons, Decreased endurance, Increased muscle spasms, Decreased activity tolerance, Pain, Decreased balance, Decreased scar mobility, Impaired flexibility, Decreased strength, Decreased mobility  Visit Diagnosis: 1. Pain in left hip   2. Muscle weakness (generalized)   3. Other abnormalities of gait and mobility   4. S/p left hip fracture        Problem List Patient Active Problem List   Diagnosis Date Noted  . Hypomagnesemia   . Hypokalemia   . Prolonged Q-T interval on ECG   . Femur fracture, left ([place]) 04/15/2019  . Chronic obstructive pulmonary disease ([place])   . Acute blood loss anemia   . Drug induced constipation   . Status post total hip replacement, left   . Supplemental oxygen dependent   . Postoperative pain   . Closed left hip fracture, initial encounter ([place]) 04/10/2019  . Other fatigue 04/09/2019  . Leukopenia due to antineoplastic chemotherapy ([place]) 01/25/2019  . Bilateral arm pain 12/14/2018  . Pancytopenia, acquired ([place]) 06/20/2018  . Weight loss 06/20/2018  . Physical deconditioning  04/10/2018  . Peripheral neuropathy due to chemotherapy ([place]) 03/29/2018  . Hypotension, unspecified 03/29/2018  . Chronic respiratory failure with hypoxia ([place]) 12/30/2017  . Other constipation 12/29/2017  . Neutropenic fever ([place]) 12/18/2017  . Thrombocytopenia ([place]) 12/18/2017  . Abdominal fullness 12/18/2017  . Acute urinary retention 12/18/2017  . Mucositis due to antineoplastic therapy 12/15/2017  . Heart burn 12/15/2017  . Frequent headaches 12/15/2017  . 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 Goals of care, counseling/discussion 11/29/2017  . Pathological fracture in neoplastic disease, right humerus, initial encounter for fracture 11/24/2017  . Hyperuricemia 11/22/2017  . Pathological fracture due to neoplastic disease, initial encounter 11/21/2017  . COPD GOLD II 07/05/2017  . COLONIC POLYPS, HYPERPLASTIC, HX OF 05/28/2010  . 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 Depression 05/05/2007   Ruben Im, PT 05/29/19 9:47 AM Phone: [phone] Fax: [phone] Alvera Singh 05/29/2019, 9:47 AM  [facility] [address], [place], [place], [address] Phone: [phone]   Fax:  [phone]  Name: Brooke Washington MRN: [id number] Date of Birth: 1940/11/21

## 2019-05-31 ENCOUNTER — Ambulatory Visit: Payer: Medicare Other | Admitting: Physical Therapy

## 2019-05-31 ENCOUNTER — Encounter: Payer: Self-pay | Admitting: Physical Therapy

## 2019-05-31 ENCOUNTER — Other Ambulatory Visit: Payer: Self-pay

## 2019-05-31 DIAGNOSIS — M6281 Muscle weakness (generalized): Secondary | ICD-10-CM | POA: Diagnosis not present

## 2019-05-31 DIAGNOSIS — M25552 Pain in left hip: Secondary | ICD-10-CM

## 2019-05-31 DIAGNOSIS — R2689 Other abnormalities of gait and mobility: Secondary | ICD-10-CM | POA: Diagnosis not present

## 2019-05-31 DIAGNOSIS — Z8781 Personal history of (healed) traumatic fracture: Secondary | ICD-10-CM | POA: Diagnosis not present

## 2019-05-31 NOTE — Therapy (Signed)
[facility] [address], [place], [place], [address] Phone: [phone]   Fax:  [phone]  Physical Therapy Treatment  Patient Details  Name: Brooke Washington MRN: [id number] Date of Birth: 13-Jul-1941 Referring Provider (PT): Reesa Chew [place]   Encounter Date: 05/31/2019  PT End of Session - 05/31/19 0902    Visit Number  9    Date for PT Re-Evaluation  06/27/19    Authorization Type  Medicare    PT Start Time  [id number]    PT Stop Time  0936    PT Time Calculation (min)  41 min    Activity Tolerance  Patient tolerated treatment well       Past Medical History:  Diagnosis Date  . Cancer ([place])  APRIL OF 2000   LOW-GRADE NON-HODGKIN'S LYMPHOMA  . COPD (chronic obstructive pulmonary disease) ([place])   . Depression   . Emphysema of lung ([place])   . GERD (gastroesophageal reflux disease)   . Hyperlipidemia   . S/p left hip fracture 04/15/2019    Past Surgical History:  Procedure Laterality Date  . 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 FRACTURE SURGERY    . IR FLUORO GUIDE PORT INSERTION RIGHT  12/07/2017  . IR US GUIDE VASC ACCESS RIGHT  12/07/2017  . ORIF HUMERUS FRACTURE Right 11/23/2017   Procedure: OPEN REDUCTION INTERNAL FIXATION (ORIF) HUMERAL SHAFT FRACTURE;  Surgeon: Shona Needles, MD;  Location: [place];  Service: Orthopedics;  Laterality: Right;  . RIGHT OOPHORECTOMY  1976  . 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   Procedure: TOTAL HIP ARTHROPLASTY ANTERIOR APPROACH LEFT HIP;  Surgeon: Rod Can, MD;  Location: [place];  Service: Orthopedics;  Laterality: Left;    There were no vitals filed for this visit.  Subjective Assessment - 05/31/19 0858    Subjective  I was in  a lot of pain in my thigh and back yesterday but today it doesn't hurt.    Patient Stated Goals  be able to walk with less pain and no walker, improve balance; fully  straighten the left knee    Currently in Pain?  No/denies    Pain Score  0-No pain    Pain Location  Hip    Pain Orientation  Left    Pain Type  Acute pain                       [facility] Adult PT Treatment/Exercise - 05/31/19 0001      Ambulation/Gait   Stairs  --   up steps reciprocally; one at a time to descend   Gait Comments  walking laps while carrying 5# weight       Therapeutic Activites    ADL's  sit to stand    Other Therapeutic Activities  walking, standing, maneuvering left LE to simulate getting in/out of the car      Knee/Hip Exercises: Stretches   Hip Flexor Stretch Limitations  1st  step hip flexor stretch right/left 10x each     Other Knee/Hip Stretches  slant board 10x right/left       Knee/Hip Exercises: Aerobic   Nustep  L1 5 min    decreased intensity; while discussing status      Knee/Hip Exercises: Standing   Forward Step Up  Right;Left;2 sets;5 reps;Hand Hold: 2;Step Height: 6"    Other Standing Knee Exercises  step taps  1 minute     Other Standing Knee Exercises  side and forward stepping over cane on floor 10x each       Knee/Hip Exercises: Seated   Long Arc Quad  Strengthening;Left;2 sets;10 reps    Long Arc Quad Weight  3 lbs.    Knee/Hip Flexion  seated march from high table 15x     Other Seated Knee/Hip Exercises  chair sit ups 15x     Other Seated Knee/Hip Exercises  foam roll push downs 15x    Sit to Sand  1 set;10 reps   very high mat table holding 5#               PT Short Term Goals - 05/24/19 0932      PT SHORT TERM GOAL #1   Title  understand tips to avoid falls    Status  Achieved      PT SHORT TERM GOAL #2   Title  independent with initial HEP    Status  Achieved      PT SHORT TERM GOAL #3   Title  TUG score </= 15 seconds with rolling walker    Time  --    Period  --    Status  Achieved      PT SHORT TERM GOAL #4   Title  pain in left hip decreased >/= 25% with movement of left hip    Status   Achieved        PT Long Term Goals - 05/02/19 1100      PT LONG TERM GOAL #1   Title  independent with HEP and understand how to progress herself    Time  8    Period  Weeks    Status  New    Target Date  06/27/19      PT LONG TERM GOAL #2   Title  abilty to ambulate 6 minutes without a rest due to improved endurance and gait with least restrictive device    Time  8    Period  Weeks    Status  New    Target Date  06/27/19      PT LONG TERM GOAL #3   Title  abilty to go up and down her steps with step over step pattern due to increased bilateral lower extremity strength >/= 4+/5    Time  8    Period  Weeks    Status  New    Target Date  06/27/19      PT LONG TERM GOAL #4   Title  able to bring left leg onto the bed without assistance due to decreased left hip pain >/= 70% and increased strength    Time  8    Period  Weeks    Status  New    Target Date  06/27/19      PT LONG TERM GOAL #5   Title  TUG score </= 13 seconds to reduce her risk of falls in a public place without an assistive device due to improved balance    Time  8    Period  Weeks    Status  New    Target Date  06/27/19            Plan - 05/31/19 [id number]    Clinical Impression Statement  The patient is limited in aerobic capacity secondary to COPD in addition to mask wearing.  Some modifications including rest breaks between every few ex's secondary to  shortness of breath and positional changes for LBP.  She reports some ex's make her back feel better.  Therapist closely monitoring response and also providing close supervision for safety with ambulation.    Comorbidities  s/p left hip anterior hip replacement 04/10/2019; s/p non Hodgkins Lymphoma; COPD    Examination-Activity Limitations  Bathing;Locomotion Level;Transfers;Bed Mobility;Squat;Dressing;Stairs;Stand    Examination-Participation Restrictions  Meal Prep;Driving;Laundry;Shop    Stability/Clinical Decision Making  Evolving/Moderate complexity     Rehab Potential  Excellent    PT Frequency  2x / week    PT Duration  8 weeks    PT Treatment/Interventions  Cryotherapy;Electrical Stimulation;Moist Heat;Balance training;Therapeutic exercise;Therapeutic activities;Gait training;Stair training;Neuromuscular re-education;Patient/family education;Dry needling;Passive range of motion;Scar mobilization;Manual techniques    PT Next Visit Plan  10th visit progress note;  TUG walking timed laps in gym ; carrying weighted object;  gentle progression of strength;    retro weight shifting with band;  sit to stand;  step ups 4 inch;   LAQ to 3#;  reciprocal  stair climbing    PT Home Exercise Plan  Access Code: [email or web address]       Patient will benefit from skilled therapeutic intervention in order to improve the following deficits and impairments:  Abnormal gait, Decreased range of motion, Difficulty walking, Increased fascial restricitons, Decreased endurance, Increased muscle spasms, Decreased activity tolerance, Pain, Decreased balance, Decreased scar mobility, Impaired flexibility, Decreased strength, Decreased mobility  Visit Diagnosis: Pain in left hip  Muscle weakness (generalized)  Other abnormalities of gait and mobility  S/p left hip fracture     Problem List Patient Active Problem List   Diagnosis Date Noted  . Hypomagnesemia   . Hypokalemia   . Prolonged Q-T interval on ECG   . Femur fracture, left ([place]) 04/15/2019  . Chronic obstructive pulmonary disease ([place])   . Acute blood loss anemia   . Drug induced constipation   . Status post total hip replacement, left   . Supplemental oxygen dependent   . Postoperative pain   . Closed left hip fracture, initial encounter ([place]) 04/10/2019  . Other fatigue 04/09/2019  . Leukopenia due to antineoplastic chemotherapy ([place]) 01/25/2019  . Bilateral arm pain 12/14/2018  . Pancytopenia, acquired ([place]) 06/20/2018  . Weight loss 06/20/2018  . Physical deconditioning 04/10/2018  . Peripheral  neuropathy due to chemotherapy ([place]) 03/29/2018  . Hypotension, unspecified 03/29/2018  . Chronic respiratory failure with hypoxia ([place]) 12/30/2017  . Other constipation 12/29/2017  . Neutropenic fever ([place]) 12/18/2017  . Thrombocytopenia ([place]) 12/18/2017  . Abdominal fullness 12/18/2017  . Acute urinary retention 12/18/2017  . Mucositis due to antineoplastic therapy 12/15/2017  . Heart burn 12/15/2017  . Frequent headaches 12/15/2017  . 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 Goals of care, counseling/discussion 11/29/2017  . Pathological fracture in neoplastic disease, right humerus, initial encounter for fracture 11/24/2017  . Hyperuricemia 11/22/2017  . Pathological fracture due to neoplastic disease, initial encounter 11/21/2017  . COPD GOLD II 07/05/2017  . COLONIC POLYPS, HYPERPLASTIC, HX OF 05/28/2010  . 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 Depression 05/05/2007   Ruben Im, PT 05/31/19 9:47 AM Phone: [phone] Fax: [phone] Alvera Singh 05/31/2019, 9:46 AM  [facility] [address] [place], [place], [address] Phone: [phone]   Fax:  [phone]  Name: Brooke Washington MRN: [id number] Date of Birth: [birth year]

## 2019-06-05 ENCOUNTER — Ambulatory Visit: Payer: Medicare Other | Admitting: Physical Therapy

## 2019-06-05 ENCOUNTER — Other Ambulatory Visit: Payer: Self-pay

## 2019-06-05 DIAGNOSIS — M25552 Pain in left hip: Secondary | ICD-10-CM | POA: Diagnosis not present

## 2019-06-05 DIAGNOSIS — M6281 Muscle weakness (generalized): Secondary | ICD-10-CM | POA: Diagnosis not present

## 2019-06-05 DIAGNOSIS — R2689 Other abnormalities of gait and mobility: Secondary | ICD-10-CM

## 2019-06-05 DIAGNOSIS — Z8781 Personal history of (healed) traumatic fracture: Secondary | ICD-10-CM | POA: Diagnosis not present

## 2019-06-05 NOTE — Therapy (Signed)
[facility] [address] [place], [place], [address] Phone: [phone]   Fax:  [phone]  Physical Therapy Treatment  Patient Details  Name: Brooke Washington MRN: [id number] Date of Birth: 08-20-1941 Referring Provider (PT): Reesa Chew PA .Progress Note Reporting Period 05/02/19  to  06/05/19  See note below for Objective Data and Assessment of Progress/Goals.       Encounter Date: 06/05/2019  PT End of Session - 06/05/19 0954    Visit Number  10    Date for PT Re-Evaluation  06/27/19    Authorization Type  Medicare    PT Start Time  0854    PT Stop Time  0941    PT Time Calculation (min)  47 min    Activity Tolerance  Patient tolerated treatment well       Past Medical History:  Diagnosis Date  . Cancer ([place])  APRIL OF 2000   LOW-GRADE NON-HODGKIN'S LYMPHOMA  . COPD (chronic obstructive pulmonary disease) ([place])   . Depression   . Emphysema of lung ([place])   . GERD (gastroesophageal reflux disease)   . Hyperlipidemia   . S/p left hip fracture 04/15/2019    Past Surgical History:  Procedure Laterality Date  . 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 FRACTURE SURGERY    . IR FLUORO GUIDE PORT INSERTION RIGHT  12/07/2017  . IR US GUIDE VASC ACCESS RIGHT  12/07/2017  . ORIF HUMERUS FRACTURE Right 11/23/2017   Procedure: OPEN REDUCTION INTERNAL FIXATION (ORIF) HUMERAL SHAFT FRACTURE;  Surgeon: Shona Needles, MD;  Location: [place];  Service: Orthopedics;  Laterality: Right;  . RIGHT OOPHORECTOMY  1976  . 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   Procedure: TOTAL HIP ARTHROPLASTY ANTERIOR APPROACH LEFT HIP;  Surgeon: Rod Can, MD;  Location: [place];  Service: Orthopedics;  Laterality: Left;    There were no vitals filed for this visit.  Subjective Assessment - 06/05/19 0859    Subjective  I wasn't as sore this morning.  I did a  lot of walking over the weekend and used the home bike.  It's 5/10 when I get up in the mornings.    Pertinent History  COPD    Patient Stated Goals  be able to walk with less pain and no walker, improve balance; fully straighten the left knee    Currently in Pain?  No/denies    Pain Score  0-No pain    Pain Orientation  Left    Aggravating Factors   AM, after sitting a few minutes         [facility] PT Assessment - 06/05/19 0001      Observation/Other Assessments   Focus on Therapeutic Outcomes (FOTO)   51% limitation       Strength   Left Knee Flexion  4-/5    Left Knee Extension  4-/5      Timed Up and Go Test   Normal TUG (seconds)  13   no device                   [facility] Adult PT Treatment/Exercise - 06/05/19 0001      Ambulation/Gait   Gait Comments  walking laps while carrying 5# weight in right hand       Therapeutic Activites    ADL's  sit to stand    Other Therapeutic Activities  walking, standing, maneuvering left  LE to simulate getting in/out of the car; stepping over obstacles       Knee/Hip Exercises: Stretches   Hip Flexor Stretch Limitations  2nd step hip flexor stretch right/left 10x each     Other Knee/Hip Stretches  slant board 10x right/left       Knee/Hip Exercises: Aerobic   Nustep  L3 6 min with 3x 30 sec rest breaks      Knee/Hip Exercises: Machines for Strengthening   Cybex Leg Press  seat 7 35# bil 20x ; 10 reps left only 30#    using left pushing 75%; painful initially but improves      Knee/Hip Exercises: Standing   Forward Step Up  Right;Left;10 reps;Hand Hold: 2;Step Height: 6"   difficult initially but improves with repetition   Other Standing Knee Exercises  step taps 1 minute       Knee/Hip Exercises: Seated   Long Arc Quad  Strengthening;Right;Left;15 reps;Weights    Long Arc Quad Weight  4 lbs.    Knee/Hip Flexion  seated march from high table 15x     Sit to Sand  3 sets;5 reps   tall table 0#, 5#, 7#                PT Short Term Goals - 06/05/19 1003      PT SHORT TERM GOAL #1   Title  understand tips to avoid falls    Status  Achieved      PT SHORT TERM GOAL #2   Title  independent with initial HEP    Status  Achieved      PT SHORT TERM GOAL #3   Title  TUG score </= 15 seconds with rolling walker    Status  Achieved      PT SHORT TERM GOAL #4   Title  pain in left hip decreased >/= 25% with movement of left hip    Status  Achieved        PT Long Term Goals - 06/05/19 1003      PT LONG TERM GOAL #1   Title  independent with HEP and understand how to progress herself    Time  8    Period  Weeks    Status  On-going      PT LONG TERM GOAL #2   Title  abilty to ambulate 6 minutes without a rest due to improved endurance and gait with least restrictive device    Time  8    Period  Weeks    Status  On-going      PT LONG TERM GOAL #3   Title  abilty to go up and down her steps with step over step pattern due to increased bilateral lower extremity strength >/= 4+/5    Time  8    Period  Weeks    Status  On-going      PT LONG TERM GOAL #4   Title  able to bring left leg onto the bed without assistance due to decreased left hip pain >/= 70% and increased strength    Time  8    Period  Weeks    Status  On-going      PT LONG TERM GOAL #5   Title  TUG score </= 13 seconds to reduce her risk of falls in a public place without an assistive device due to improved balance    Status  Achieved  Plan - 06/05/19 0954    Clinical Impression Statement  The patient has modest improvements in FOTO functional outcome score however her Timed up and Go speed is signiticantly improved and performed without an assistive device.  Her hip flexion, knee extension and knee flexion is gradually improving and she is able to perform additional resistive  strengthening on the leg press.  Several exercises produce increased pain with the first few repetitions but improve  with repetition.  Her COPD along with wearing her mask is a significant limiting factor during exercise and frequent rest breaks to catch her breath are needed.  Therapist also monitoring pain response.    Comorbidities  s/p left hip anterior hip replacement 04/10/2019; s/p non Hodgkins Lymphoma; COPD    Rehab Potential  Excellent    PT Frequency  2x / week    PT Duration  8 weeks    PT Treatment/Interventions  Cryotherapy;Electrical Stimulation;Moist Heat;Balance training;Therapeutic exercise;Therapeutic activities;Gait training;Stair training;Neuromuscular re-education;Patient/family education;Dry needling;Passive range of motion;Scar mobilization;Manual techniques    PT Next Visit Plan  walking timed laps in gym ; carrying weighted object;  gentle progression of strength;    retro weight shifting with band;  sit to stand;  step ups  LAQ to 4#;  reciprocal  stair climbing; balance activity putting things on shelves    PT Home Exercise Plan  Access Code: [email or web address]       Patient will benefit from skilled therapeutic intervention in order to improve the following deficits and impairments:  Abnormal gait, Decreased range of motion, Difficulty walking, Increased fascial restricitons, Decreased endurance, Increased muscle spasms, Decreased activity tolerance, Pain, Decreased balance, Decreased scar mobility, Impaired flexibility, Decreased strength, Decreased mobility  Visit Diagnosis: Pain in left hip  Muscle weakness (generalized)  Other abnormalities of gait and mobility  S/p left hip fracture     Problem List Patient Active Problem List   Diagnosis Date Noted  . Hypomagnesemia   . Hypokalemia   . Prolonged Q-T interval on ECG   . Femur fracture, left ([place]) 04/15/2019  . Chronic obstructive pulmonary disease ([place])   . Acute blood loss anemia   . Drug induced constipation   . Status post total hip replacement, left   . Supplemental oxygen dependent   . Postoperative pain   . Closed  left hip fracture, initial encounter ([place]) 04/10/2019  . Other fatigue 04/09/2019  . Leukopenia due to antineoplastic chemotherapy ([place]) 01/25/2019  . Bilateral arm pain 12/14/2018  . Pancytopenia, acquired ([place]) 06/20/2018  . Weight loss 06/20/2018  . Physical deconditioning 04/10/2018  . Peripheral neuropathy due to chemotherapy ([place]) 03/29/2018  . Hypotension, unspecified 03/29/2018  . Chronic respiratory failure with hypoxia ([place]) 12/30/2017  . Other constipation 12/29/2017  . Neutropenic fever ([place]) 12/18/2017  . Thrombocytopenia ([place]) 12/18/2017  . Abdominal fullness 12/18/2017  . Acute urinary retention 12/18/2017  . Mucositis due to antineoplastic therapy 12/15/2017  . Heart burn 12/15/2017  . Frequent headaches 12/15/2017  . 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 Goals of care, counseling/discussion 11/29/2017  . Pathological fracture in neoplastic disease, right humerus, initial encounter for fracture 11/24/2017  . Hyperuricemia 11/22/2017  . Pathological fracture due to neoplastic disease, initial encounter 11/21/2017  . COPD GOLD II 07/05/2017  . COLONIC POLYPS, HYPERPLASTIC, HX OF 05/28/2010  . 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 Depression 05/05/2007   Ruben Im, PT 06/05/19 10:05 AM Phone: [phone] Fax: [phone] Alvera Singh 06/05/2019, 10:04 AM  [facility] [address], [place], [place], [address] Phone: [phone]   Fax:  [phone]  Name: Brooke Washington MRN: [id number] Date of Birth: 03/06/1941

## 2019-06-07 ENCOUNTER — Encounter: Payer: Self-pay | Admitting: Physical Therapy

## 2019-06-07 ENCOUNTER — Ambulatory Visit: Payer: Medicare Other | Admitting: Physical Therapy

## 2019-06-07 ENCOUNTER — Other Ambulatory Visit: Payer: Self-pay

## 2019-06-07 DIAGNOSIS — R2689 Other abnormalities of gait and mobility: Secondary | ICD-10-CM | POA: Diagnosis not present

## 2019-06-07 DIAGNOSIS — M25552 Pain in left hip: Secondary | ICD-10-CM | POA: Diagnosis not present

## 2019-06-07 DIAGNOSIS — Z8781 Personal history of (healed) traumatic fracture: Secondary | ICD-10-CM | POA: Diagnosis not present

## 2019-06-07 DIAGNOSIS — M6281 Muscle weakness (generalized): Secondary | ICD-10-CM

## 2019-06-07 NOTE — Therapy (Signed)
[facility] [address], [place], [place], [address] Phone: [phone]   Fax:  [phone]  Physical Therapy Treatment  Patient Details  Name: Brooke Washington MRN: [id number] Date of Birth: 06-14-1941 Referring Provider (PT): Reesa Chew [place]   Encounter Date: 06/07/2019  PT End of Session - 06/07/19 0935    Visit Number  11    Date for PT Re-Evaluation  06/27/19    Authorization Type  Medicare    PT Start Time  [id number]    PT Stop Time  0941    PT Time Calculation (min)  43 min       Past Medical History:  Diagnosis Date  . Cancer ([place])  APRIL OF 2000   LOW-GRADE NON-HODGKIN'S LYMPHOMA  . COPD (chronic obstructive pulmonary disease) ([place])   . Depression   . Emphysema of lung ([place])   . GERD (gastroesophageal reflux disease)   . Hyperlipidemia   . S/p left hip fracture 04/15/2019    Past Surgical History:  Procedure Laterality Date  . 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 FRACTURE SURGERY    . IR FLUORO GUIDE PORT INSERTION RIGHT  12/07/2017  . IR US GUIDE VASC ACCESS RIGHT  12/07/2017  . ORIF HUMERUS FRACTURE Right 11/23/2017   Procedure: OPEN REDUCTION INTERNAL FIXATION (ORIF) HUMERAL SHAFT FRACTURE;  Surgeon: Shona Needles, MD;  Location: [place];  Service: Orthopedics;  Laterality: Right;  . RIGHT OOPHORECTOMY  1976  . 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   Procedure: TOTAL HIP ARTHROPLASTY ANTERIOR APPROACH LEFT HIP;  Surgeon: Rod Can, MD;  Location: [place];  Service: Orthopedics;  Laterality: Left;    There were no vitals filed for this visit.  Subjective Assessment - 06/07/19 0903    Subjective  I was real sore yesterday from our work on Tuesday.  It's just stiff this morning.  It doesn't hurt now but when I walk around it's a 5/10.    Currently in Pain?  No/denies    Pain Score  0-No pain    Pain Location  Hip    Pain  Orientation  Left    Pain Type  Acute pain                       [facility] Adult PT Treatment/Exercise - 06/07/19 0001      Ambulation/Gait   Gait Comments  2 minute walk without RW       Therapeutic Activites    ADL's  sit to stand, carrying boxes, reaching into cabinets     Other Therapeutic Activities  walking, standing, maneuvering left LE to simulate getting in/out of the car; stepping over obstacles       Knee/Hip Exercises: Stretches   Hip Flexor Stretch Limitations  2nd step hip flexor stretch right/left 10x each     Other Knee/Hip Stretches  slant board 10x right/left       Knee/Hip Exercises: Aerobic   Nustep  L3 6 min with rest breaks after each minute       Knee/Hip Exercises: Machines for Strengthening   Cybex Leg Press  seat 8 50# bil;  30# single leg 10x2 left and right       Knee/Hip Exercises: Standing   Forward Step Up  Right;Left;10 reps;Hand Hold: 2;Step Height: 6"   difficult initially but improves with repetition   Other Standing Knee Exercises  side and forward stepping over 3 obstacles on floor 3x5       Knee/Hip Exercises: Seated   Long Arc Quad  --    Long Arc [employer]  --    Sit to [employer]  5 reps   chair with black foam               PT Short Term Goals - 06/05/19 1003      PT SHORT TERM GOAL #1   Title  understand tips to avoid falls    Status  Achieved      PT SHORT TERM GOAL #2   Title  independent with initial HEP    Status  Achieved      PT SHORT TERM GOAL #3   Title  TUG score </= 15 seconds with rolling walker    Status  Achieved      PT SHORT TERM GOAL #4   Title  pain in left hip decreased >/= 25% with movement of left hip    Status  Achieved        PT Long Term Goals - 06/05/19 1003      PT LONG TERM GOAL #1   Title  independent with HEP and understand how to progress herself    Time  8    Period  Weeks    Status  On-going      PT LONG TERM GOAL #2   Title  abilty to ambulate 6 minutes without  a rest due to improved endurance and gait with least restrictive device    Time  8    Period  Weeks    Status  On-going      PT LONG TERM GOAL #3   Title  abilty to go up and down her steps with step over step pattern due to increased bilateral lower extremity strength >/= 4+/5    Time  8    Period  Weeks    Status  On-going      PT LONG TERM GOAL #4   Title  able to bring left leg onto the bed without assistance due to decreased left hip pain >/= 70% and increased strength    Time  8    Period  Weeks    Status  On-going      PT LONG TERM GOAL #5   Title  TUG score </= 13 seconds to reduce her risk of falls in a public place without an assistive device due to improved balance    Status  Achieved            Plan - 06/07/19 0942    Clinical Impression Statement  The patient reports continued soreness in left anterior thigh consistent with < 2 months post-op surgery following traumatic fall.  Improved quad muscle activation noted with step ups and able to increase resistance on leg press.  Able to climb steps reciprocally but needs bil UE support on railings.    She is able to perform functional task like reaching overhead and carrying light objects without loss of balance.  Shortness of breath due to COPD along with wearing a mask is a limiting factor requiring standing rest breaks between exercises.  Therapist closely monitoring her response with all interventions and providing supervision for safety.    Comorbidities  s/p left hip anterior hip replacement 04/10/2019; s/p non Hodgkins Lymphoma; COPD    Rehab Potential  Excellent    PT Frequency  2x / week  PT Duration  8 weeks    PT Treatment/Interventions  Cryotherapy;Electrical Stimulation;Moist Heat;Balance training;Therapeutic exercise;Therapeutic activities;Gait training;Stair training;Neuromuscular re-education;Patient/family education;Dry needling;Passive range of motion;Scar mobilization;Manual techniques    PT Next Visit  Plan  walking timed laps in gym ; carrying weighted object;  gentle progression of strength;    retro weight shifting with band;  sit to stand;  step ups;  leg press;   reciprocal  stair climbing; balance activity putting things on shelves    PT Home Exercise Plan  Access Code: [email or web address]       Patient will benefit from skilled therapeutic intervention in order to improve the following deficits and impairments:  Abnormal gait, Decreased range of motion, Difficulty walking, Increased fascial restricitons, Decreased endurance, Increased muscle spasms, Decreased activity tolerance, Pain, Decreased balance, Decreased scar mobility, Impaired flexibility, Decreased strength, Decreased mobility  Visit Diagnosis: Pain in left hip  Muscle weakness (generalized)  Other abnormalities of gait and mobility  S/p left hip fracture     Problem List Patient Active Problem List   Diagnosis Date Noted  . Hypomagnesemia   . Hypokalemia   . Prolonged Q-T interval on ECG   . Femur fracture, left ([place]) 04/15/2019  . Chronic obstructive pulmonary disease ([place])   . Acute blood loss anemia   . Drug induced constipation   . Status post total hip replacement, left   . Supplemental oxygen dependent   . Postoperative pain   . Closed left hip fracture, initial encounter ([place]) 04/10/2019  . Other fatigue 04/09/2019  . Leukopenia due to antineoplastic chemotherapy ([place]) 01/25/2019  . Bilateral arm pain 12/14/2018  . Pancytopenia, acquired ([place]) 06/20/2018  . Weight loss 06/20/2018  . Physical deconditioning 04/10/2018  . Peripheral neuropathy due to chemotherapy ([place]) 03/29/2018  . Hypotension, unspecified 03/29/2018  . Chronic respiratory failure with hypoxia ([place]) 12/30/2017  . Other constipation 12/29/2017  . Neutropenic fever ([place]) 12/18/2017  . Thrombocytopenia ([place]) 12/18/2017  . Abdominal fullness 12/18/2017  . Acute urinary retention 12/18/2017  . Mucositis due to antineoplastic therapy  12/15/2017  . Heart burn 12/15/2017  . Frequent headaches 12/15/2017  . 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 Goals of care, counseling/discussion 11/29/2017  . Pathological fracture in neoplastic disease, right humerus, initial encounter for fracture 11/24/2017  . Hyperuricemia 11/22/2017  . Pathological fracture due to neoplastic disease, initial encounter 11/21/2017  . COPD GOLD II 07/05/2017  . COLONIC POLYPS, HYPERPLASTIC, HX OF 05/28/2010  . 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 Depression 05/05/2007   Ruben Im, PT 06/07/19 9:52 AM Phone: [phone] Fax: [phone] Alvera Singh 06/07/2019, 9:52 AM  [facility] [address] [place], [place], [address] Phone: [phone]   Fax:  [phone]  Name: Brooke Washington MRN: [id number] Date of Birth: 1941/07/09

## 2019-06-11 ENCOUNTER — Other Ambulatory Visit: Payer: Self-pay

## 2019-06-11 ENCOUNTER — Inpatient Hospital Stay (HOSPITAL_BASED_OUTPATIENT_CLINIC_OR_DEPARTMENT_OTHER): Payer: Medicare Other | Admitting: Hematology and Oncology

## 2019-06-11 ENCOUNTER — Inpatient Hospital Stay: Payer: Medicare Other | Attending: Hematology and Oncology

## 2019-06-11 ENCOUNTER — Telehealth: Payer: Self-pay | Admitting: Hematology and Oncology

## 2019-06-11 ENCOUNTER — Inpatient Hospital Stay: Payer: Medicare Other

## 2019-06-11 DIAGNOSIS — Z8572 Personal history of non-Hodgkin lymphomas: Secondary | ICD-10-CM | POA: Insufficient documentation

## 2019-06-11 DIAGNOSIS — C8588 Other specified types of non-Hodgkin lymphoma, lymph nodes of multiple sites: Secondary | ICD-10-CM | POA: Diagnosis not present

## 2019-06-11 DIAGNOSIS — Z452 Encounter for adjustment and management of vascular access device: Secondary | ICD-10-CM | POA: Insufficient documentation

## 2019-06-11 DIAGNOSIS — C8338 Diffuse large B-cell lymphoma, lymph nodes of multiple sites: Secondary | ICD-10-CM

## 2019-06-11 DIAGNOSIS — R5381 Other malaise: Secondary | ICD-10-CM

## 2019-06-11 LAB — COMPREHENSIVE METABOLIC PANEL
ALT: 11 U/L (ref 0–44)
AST: 17 U/L (ref 15–41)
Albumin: 3.9 g/dL (ref 3.5–5.0)
Alkaline Phosphatase: 160 U/L — ABNORMAL HIGH (ref 38–126)
Anion gap: 11 (ref 5–15)
BUN: 16 mg/dL (ref 8–23)
CO2: 23 mmol/L (ref 22–32)
Calcium: 9.1 mg/dL (ref 8.9–10.3)
Chloride: 108 mmol/L (ref 98–111)
Creatinine, Ser: 0.71 mg/dL (ref 0.44–1.00)
GFR calc Af Amer: >60 mL/min (ref >60)
GFR calc non Af Amer: >60 mL/min (ref >60)
Glucose, Bld: 92 mg/dL (ref 70–99)
Potassium: 3.7 mmol/L (ref 3.5–5.1)
Sodium: 142 mmol/L (ref 135–145)
Total Bilirubin: 0.3 mg/dL (ref 0.3–1.2)
Total Protein: 6.1 g/dL — ABNORMAL LOW (ref 6.5–8.1)

## 2019-06-11 LAB — CBC WITH DIFFERENTIAL/PLATELET
Abs Immature Granulocytes: 0.02 10*3/uL (ref 0.00–0.07)
Basophils Absolute: 0 10*3/uL (ref 0.0–0.1)
Basophils Relative: 0 %
Eosinophils Absolute: 0.4 10*3/uL (ref 0.0–0.5)
Eosinophils Relative: 6 %
HCT: 38.8 % (ref 36.0–46.0)
Hemoglobin: 12.8 g/dL (ref 12.0–15.0)
Immature Granulocytes: 0 %
Lymphocytes Relative: 18 %
Lymphs Abs: 1.2 10*3/uL (ref 0.7–4.0)
MCH: 33.2 pg (ref 26.0–34.0)
MCHC: 33 g/dL (ref 30.0–36.0)
MCV: 100.8 fL — ABNORMAL HIGH (ref 80.0–100.0)
Monocytes Absolute: 0.8 10*3/uL (ref 0.1–1.0)
Monocytes Relative: 11 %
Neutro Abs: 4.5 10*3/uL (ref 1.7–7.7)
Neutrophils Relative %: 65 %
Platelets: 267 10*3/uL (ref 150–400)
RBC: 3.85 MIL/uL — ABNORMAL LOW (ref 3.87–5.11)
RDW: 15.8 % — ABNORMAL HIGH (ref 11.5–15.5)
WBC: 6.9 10*3/uL (ref 4.0–10.5)
nRBC: 0 % (ref 0.0–0.2)

## 2019-06-11 MED ORDER — HEPARIN SOD (PORK) LOCK FLUSH 100 UNIT/ML IV SOLN
500.0000 [IU] | Freq: Once | INTRAVENOUS | Status: AC
  Administered 2019-06-11: 500 [IU]
  Filled 2019-06-11: qty 5

## 2019-06-11 MED ORDER — SODIUM CHLORIDE 0.9% FLUSH
10.0000 mL | Freq: Once | INTRAVENOUS | Status: AC
  Administered 2019-06-11: 10 mL
  Filled 2019-06-11: qty 10

## 2019-06-11 NOTE — Telephone Encounter (Signed)
I talk with patient regarding schedule  

## 2019-06-12 ENCOUNTER — Encounter: Payer: Self-pay | Admitting: Physical Therapy

## 2019-06-12 ENCOUNTER — Ambulatory Visit: Payer: Medicare Other | Attending: Physical Medicine and Rehabilitation | Admitting: Physical Therapy

## 2019-06-12 ENCOUNTER — Encounter: Payer: Self-pay | Admitting: Hematology and Oncology

## 2019-06-12 DIAGNOSIS — M6281 Muscle weakness (generalized): Secondary | ICD-10-CM | POA: Insufficient documentation

## 2019-06-12 DIAGNOSIS — Z8781 Personal history of (healed) traumatic fracture: Secondary | ICD-10-CM | POA: Insufficient documentation

## 2019-06-12 DIAGNOSIS — R2689 Other abnormalities of gait and mobility: Secondary | ICD-10-CM | POA: Insufficient documentation

## 2019-06-12 DIAGNOSIS — M25552 Pain in left hip: Secondary | ICD-10-CM | POA: Insufficient documentation

## 2019-06-12 NOTE — Assessment & Plan Note (Signed)
She is recovering nicely with physical therapy She will continue as scheduled until end of October

## 2019-06-12 NOTE — Progress Notes (Signed)
[place] OFFICE PROGRESS NOTE  Patient Care Team: [name], NP as PCP - General (Family Medicine)  ASSESSMENT & PLAN:  Large cell lymphoma of multiple sites [facility]) I have reviewed the plan of care with the patient and her husband over the telephone The patient have no evidence of cancer recurrence She tolerated Revlimid poorly in the past with mucositis, weight loss and feeling unwell She is fully recovered from side effects of treatment since discontinuation of Revlimid She is now recovering from recent fall/hip fracture She is undergoing physical therapy We will monitor for signs of relapse closely I will see her again in 8 weeks for further follow-up  Physical deconditioning She is recovering nicely with physical therapy She will continue as scheduled until end of October   No orders of the defined types were placed in this encounter.   INTERVAL HISTORY: Please see below for problem oriented charting. 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 She was treated with chlorambucil in 2000.   --2007 - 09/2008.  Treated with single agent Rituxan.   --12/2008.  She had progressive adenopathy in chest and abdomen and repeat biopsy showed low grade follicular and diffuse B cell NHL.   --01/2009 - 07/2009.  She required left ureteral stent and was treated with Treanda/Rituxan x 5 cycles from 02-2009 thru 06-2009.    09/16/2014 Imaging   No lymphadenopathy in the chest, abdomen, or pelvis. The small right axillary lymph nodes seen on the previous exam are unchanged in the 18 month interval. Small to moderate hiatal hernia.   11/20/2017 Imaging   Mildly displaced and angulated pathologic fracture of the proximal humeral shaft. 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    11/22/2017 PET scan   New hypermetabolic lymphadenopathy within the chest and abdomen, consistent with recurrent lymphoma. New large hypermetabolic mass in the right hepatic lobe, consistent with lymphoma this involvement. New small hypermetabolic mass in the left kidney, which may be due to lymphomatous involvement although primary renal cell carcinoma cannot be excluded. Hypermetabolic mass in the right humerus with pathologic fracture, consistent with lymphomatous involvement. Small hypermetabolic foci in right posterior chest wall, also suspicious for recurrent lymphoma.   11/23/2017 Procedure   Bone, fragment(s), Right Humeral Shaft Reamings - ATYPICAL LYMPHOID INFILTRATE CONSISTENT WITH NON-HODGKIN'S B CELL LYMPHOMA. - SEE ONCOLOGY TABLE. Microscopic Comment LYMPHOMA Histologic type: Non-Hodgkins lymphoma, favor large cell type. Grade (if applicable): Favor high grade. Flow cytometry: No specimen is available for analysis. Immunohistochemical stains: BCL-2, BCL-6, CD3, CD5, CD10, CD15, CD20, CD30, CD34, CD43, LCA, CD79a, CD138, Ki-67, PAX-5 and TdT with appropriate controls as performed on block 1C. Touch preps/imprints: Not performed. Comments: The sections show multiple, primarily soft tissue fragments displaying a dense lymphoid infiltrate characterized by a mixture of small round to slightly irregular lymphocytes, histiocytes, and large centroblastic appearing lymphoid cells with partially  clumped to vesicular  chromatin, small nucleoli and amphophilic to clear cytoplasm. The number of admixed large lymphoid cells is variable but they appear relatively abundant in some areas associated with clustering. No material is available for flow cytometric analysis. Hence, a large batter of immunohistochemical stains were performed and show a mixture of T and B cells but with a significant B cell component mostly composed of large lymphoid cells with positivity for CD79a, PAX-5, CD30, BCL-2, and BCL-6. LCA shows diffuse staining although some of the large lymphoid cells appear negative. No significant staining is seen with CD20, CD10, CD15, CD34, TdT, or CD138. The lack of CD20 expression in this material is possibly related to prior treatment. There is variable increased expression of Ki-67 ranging from 10 to >50% in some areas particularly where there is abundance of large lymphoid cells. There is admixed abundant T cell population in the background as seen with CD3, CD5, and CD43 with on apparent co-expression of CD5 in B cell areas. The overall findings are consistent with involvement by non-Hodgkin's B cell lymphoma and the presence of relative abundance of large lymphoid B-cells and increased Ki-67 expression favor a high grade large B cell lymphoma.   11/23/2017 Surgery   1. CPT 24516-Intramedullary nailing of right humerus fracture 2. CPT 20245-Bone biopsy, deep     11/23/2017 - 11/25/2017 Hospital Admission   She was admitted to the hospital for management of humerus fracture and aggressive lymphoma   11/24/2017 Imaging   ECHO showed LV EF: 65% -   70%   11/25/2017 Procedure   Status post ultrasound-guided biopsy of liver mass. Tissue specimen sent to pathology for complete histopathologic analysis   12/07/2017 Imaging   Successful placement of a right internal jugular approach power injectable Port-A-Cath. The catheter is ready for immediate use.   12/09/2017 - 03/29/2018 Chemotherapy    The patient had R-CHOP chemo     12/17/2017 - 12/20/2017 Hospital Admission   She was hospitalized for influenza infection   01/03/2018 Procedure   Unsuccessful initial L4-5 LP. Successful L2-3 lumbar puncture with installation of methotrexate.   01/24/2018 Procedure   Non complicated injection of intrathecal methotrexate, as above.   02/09/2018 Imaging   LV EF: 55% -  60%   02/10/2018 PET scan   1. Interval response to therapy. There has been resolution of previous hypermetabolic tumor within the chest and abdomen. Deauville criteria 2 and 3. 2. Diffuse radiotracer uptake is identified within the axial and appendicular skeleton which is favored to represent treatment related changes. 3. Significant decrease in FDG uptake associated with previously noted hypermetabolic tumor involving the proximal humeral diaphysis. The FDG uptake within this area on today's study is equivalent to that which is seen in the lumbar spine. 4. Aortic Atherosclerosis (ICD10-I70.0). Lad coronary artery calcifications. 5. Hiatal hernia   02/15/2018 Procedure   Fluoroscopic guided lumbar puncture and intrathecal injection of chemotherapy without complication.   03/31/2018 Procedure   Technically successful fluoroscopic guided lumbar puncture for intrathecal methotrexate injection. Procedure was well tolerated without evidence for immediate complications. Patient will recover in short stay prior to being discharged home   05/08/2018 PET scan   No residual/persistent enlarged or hypermetabolic lymph nodes in the neck, chest, abdomen or pelvis. Findings suggest an excellent response to treatment.   05/25/2018 -  Chemotherapy   The patient had Revlimid for maintenance chemo    06/07/2018 Imaging   LV EF: 55% -  60%   09/25/2018 PET scan   1. Stable exam. No hypermetabolic  lymphadenopathy in the neck, chest, abdomen, or pelvis. 2.  Aortic Atherosclerois (ICD10-170.0) 3.  Emphysema. (ICD10-J43.9)    04/09/2019  Imaging   1. No CT findings without contrast to suggest recurrent lymphoma involving the chest, abdomen or pelvis. 2. Stable advanced atherosclerotic calcifications involving the thoracic and abdominal aorta and branch vessels. 3. Moderate to large hiatal hernia. 4. No significant bony findings.     REVIEW OF SYSTEMS:   Constitutional: Denies fevers, chills or abnormal weight loss Eyes: Denies blurriness of vision Ears, nose, mouth, throat, and face: Denies mucositis or sore throat Respiratory: Denies cough, dyspnea or wheezes Cardiovascular: Denies palpitation, chest discomfort or lower extremity swelling Gastrointestinal:  Denies nausea, heartburn or change in bowel habits Skin: Denies abnormal skin rashes Lymphatics: Denies new lymphadenopathy or easy bruising Neurological:Denies numbness, tingling or new weaknesses Behavioral/Psych: Mood is stable, no new changes  All other systems were reviewed with the patient and are negative.  I have reviewed the past medical history, past surgical history, social history and family history with the patient and they are unchanged from previous note.  ALLERGIES:  is allergic to contrast media [iodinated diagnostic agents]; iohexol; povidone-iodine; penicillins; and adhesive [tape].  MEDICATIONS:  Current Outpatient Medications  Medication Sig Dispense Refill  . acetaminophen (TYLENOL) 325 MG tablet Take 1-2 tablets (325-650 mg total) by mouth every 6 (six) hours as needed for mild pain (pain score 1-3 or temp > 100.5).    Marland Kitchen albuterol (PROVENTIL HFA;VENTOLIN HFA) 108 (90 Base) MCG/ACT inhaler Inhale 2 puffs into the lungs every 6 (six) hours as needed for wheezing or shortness of breath. 1 Inhaler 0  . BREO ELLIPTA 100-25 MCG/INH AEPB ONE INHALTION INTO LUNGS EVERY DAY (Patient taking differently: Inhale 1 puff into the lungs every other day. ) 60 each 6  . cholecalciferol (VITAMIN D) 1000 units tablet Take 2,000 Units by mouth daily.    Marland Kitchen  KLOR-CON M20 20 MEQ tablet TAKE 1 TABLET BY MOUTH EVERY DAY 90 tablet 1  . methocarbamol (ROBAXIN) 500 MG tablet Take 1 tablet (500 mg total) by mouth every 6 (six) hours as needed for muscle spasms. 30 tablet 1  . polyethylene glycol (MIRALAX / GLYCOLAX) 17 g packet Take 17 g by mouth daily as needed. 14 each 0  . sertraline (ZOLOFT) 25 MG tablet Take 1 tablet (25 mg total) by mouth daily. 90 tablet 0   No current facility-administered medications for this visit.     PHYSICAL EXAMINATION: ECOG PERFORMANCE STATUS: 1 - Symptomatic but completely ambulatory  Vitals:   06/11/19 0952  BP: (!) 128/59  Pulse: 76  Resp: 18  Temp: 98.7 F (37.1 C)  SpO2: 95%   Filed Weights   06/11/19 0952  Weight: 121 lb (54.9 kg)    GENERAL:alert, no distress and comfortable SKIN: skin color, texture, turgor are normal, no rashes or significant lesions EYES: normal, Conjunctiva are pink and non-injected, sclera clear OROPHARYNX:no exudate, no erythema and lips, buccal mucosa, and tongue normal  NECK: supple, thyroid normal size, non-tender, without nodularity LYMPH:  no palpable lymphadenopathy in the cervical, axillary or inguinal LUNGS: clear to auscultation and percussion with normal breathing effort HEART: regular rate & rhythm and no murmurs and no lower extremity edema ABDOMEN:abdomen soft, non-tender and normal bowel sounds Musculoskeletal:no cyanosis of digits and no clubbing  NEURO: alert & oriented x 3 with fluent speech, no focal motor/sensory deficits  LABORATORY DATA:  I have reviewed the data as listed    Component Value  Date/Time   NA 142 06/11/2019 0932   NA 142 12/28/2016 1002   K 3.7 06/11/2019 0932   K 4.6 12/28/2016 1002   CL 108 06/11/2019 0932   CL 101 03/28/2013 0826   CO2 23 06/11/2019 0932   CO2 29 12/28/2016 1002   GLUCOSE 92 06/11/2019 0932   GLUCOSE 85 12/28/2016 1002   GLUCOSE 84 03/28/2013 0826   BUN 16 06/11/2019 0932   BUN 20.3 12/28/2016 1002    CREATININE 0.71 06/11/2019 0932   CREATININE 0.78 05/03/2019 1019   CREATININE 0.9 12/28/2016 1002   CALCIUM 9.1 06/11/2019 0932   CALCIUM 10.1 12/28/2016 1002   PROT 6.1 (L) 06/11/2019 0932   PROT 6.8 12/28/2016 1002   ALBUMIN 3.9 06/11/2019 0932   ALBUMIN 4.2 12/28/2016 1002   AST 17 06/11/2019 0932   AST 12 (L) 05/03/2019 1019   AST 20 12/28/2016 1002   ALT 11 06/11/2019 0932   ALT 10 05/03/2019 1019   ALT 12 12/28/2016 1002   ALKPHOS 160 (H) 06/11/2019 0932   ALKPHOS 111 12/28/2016 1002   BILITOT 0.3 06/11/2019 0932   BILITOT 0.3 05/03/2019 1019   BILITOT 0.55 12/28/2016 1002   GFRNONAA >60 06/11/2019 0932   GFRNONAA >60 05/03/2019 1019   GFRAA >60 06/11/2019 0932   GFRAA >60 05/03/2019 1019    No results found for: SPEP, UPEP  Lab Results  Component Value Date   WBC 6.9 06/11/2019   NEUTROABS 4.5 06/11/2019   HGB 12.8 06/11/2019   HCT 38.8 06/11/2019   MCV 100.8 (H) 06/11/2019   PLT 267 06/11/2019      Chemistry      Component Value Date/Time   NA 142 06/11/2019 0932   NA 142 12/28/2016 1002   K 3.7 06/11/2019 0932   K 4.6 12/28/2016 1002   CL 108 06/11/2019 0932   CL 101 03/28/2013 0826   CO2 23 06/11/2019 0932   CO2 29 12/28/2016 1002   BUN 16 06/11/2019 0932   BUN 20.3 12/28/2016 1002   CREATININE 0.71 06/11/2019 0932   CREATININE 0.78 05/03/2019 1019   CREATININE 0.9 12/28/2016 1002      Component Value Date/Time   CALCIUM 9.1 06/11/2019 0932   CALCIUM 10.1 12/28/2016 1002   ALKPHOS 160 (H) 06/11/2019 0932   ALKPHOS 111 12/28/2016 1002   AST 17 06/11/2019 0932   AST 12 (L) 05/03/2019 1019   AST 20 12/28/2016 1002   ALT 11 06/11/2019 0932   ALT 10 05/03/2019 1019   ALT 12 12/28/2016 1002   BILITOT 0.3 06/11/2019 0932   BILITOT 0.3 05/03/2019 1019   BILITOT 0.55 12/28/2016 1002      All questions were answered. The patient knows to call the clinic with any problems, questions or concerns. No barriers to learning was detected.  I spent  15 minutes counseling the patient face to face. The total time spent in the appointment was 20 minutes and more than 50% was on counseling and review of test results  [name], MD 06/12/2019 8:49 AM

## 2019-06-12 NOTE — Assessment & Plan Note (Signed)
I have reviewed the plan of care with the patient and her husband over the telephone The patient have no evidence of cancer recurrence She tolerated Revlimid poorly in the past with mucositis, weight loss and feeling unwell She is fully recovered from side effects of treatment since discontinuation of Revlimid She is now recovering from recent fall/hip fracture She is undergoing physical therapy We will monitor for signs of relapse closely I will see her again in 8 weeks for further follow-up

## 2019-06-12 NOTE — Therapy (Signed)
[facility] [address] [place], [place], [address] Phone: [phone]   Fax:  [phone]  Physical Therapy Treatment  Patient Details  Name: Brooke [place] MRN: [id number] Date of Birth: 22-Feb-1941 Referring Provider (PT): Reesa Chew [place]   Encounter Date: 06/12/2019  PT End of Session - 06/12/19 0947    Visit Number  12    Date for PT Re-Evaluation  06/27/19    Authorization Type  Medicare    PT Start Time  0900    PT Stop Time  0942    PT Time Calculation (min)  42 min    Activity Tolerance  Patient tolerated treatment well       Past Medical History:  Diagnosis Date  . Cancer ([place])  APRIL OF 2000   LOW-GRADE NON-HODGKIN'S LYMPHOMA  . COPD (chronic obstructive pulmonary disease) ([place])   . Depression   . Emphysema of lung ([place])   . GERD (gastroesophageal reflux disease)   . Hyperlipidemia   . S/p left hip fracture 04/15/2019    Past Surgical History:  Procedure Laterality Date  . 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 FRACTURE SURGERY    . IR FLUORO GUIDE PORT INSERTION RIGHT  12/07/2017  . IR US GUIDE VASC ACCESS RIGHT  12/07/2017  . ORIF HUMERUS FRACTURE Right 11/23/2017   Procedure: OPEN REDUCTION INTERNAL FIXATION (ORIF) HUMERAL SHAFT FRACTURE;  Surgeon: Shona Needles, MD;  Location: [facility];  Service: Orthopedics;  Laterality: Right;  . RIGHT OOPHORECTOMY  1976  . 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   Procedure: TOTAL HIP ARTHROPLASTY ANTERIOR APPROACH LEFT HIP;  Surgeon: Rod Can, MD;  Location: [place];  Service: Orthopedics;  Laterality: Left;    There were no vitals filed for this visit.  Subjective Assessment - 06/12/19 0902    Subjective  My leg is sore as usual.  I saw my oncologist yesterday and she was concerned that my leg was still sore.    Pertinent History  COPD    Currently in Pain?  No/denies    Pain  Score  0-No pain    Pain Location  Hip    Pain Orientation  Left                       [facility] Adult PT Treatment/Exercise - 06/12/19 0001      Ambulation/Gait   Pre-Gait Activities  dead lift 8# 10x from knee level ; obstacle course walk     Gait Comments  carrying 5# weight 100 feet      Therapeutic Activites    ADL's  sit to stand, carrying, picking up objects from floor    Other Therapeutic Activities  walking, standing, maneuvering left LE to simulate getting in/out of the car; stepping over obstacles       Knee/Hip Exercises: Stretches   Hip Flexor Stretch Limitations  2nd step hip flexor stretch right/left 10x each       Knee/Hip Exercises: Aerobic   Nustep  L3 6 min with rest breaks after each minute       Knee/Hip Exercises: Machines for Strengthening   Cybex Leg Press  seat 8 55# bil;  50# single leg 10x left and right       Knee/Hip Exercises: Standing   Forward Step Up  Right;Left;10 reps;Hand Hold: 2;Step Height: 6"   difficult initially but improves with repetition  Other Standing Knee Exercises  step taps 1 minute right/left     Other Standing Knee Exercises  side and forward mini lunge  obstacles on floor 3x5       Knee/Hip Exercises: Seated   Sit to Sand  --   very high table with 8# weight 10x              PT Short Term Goals - 06/05/19 1003      PT SHORT TERM GOAL #1   Title  understand tips to avoid falls    Status  Achieved      PT SHORT TERM GOAL #2   Title  independent with initial HEP    Status  Achieved      PT SHORT TERM GOAL #3   Title  TUG score </= 15 seconds with rolling walker    Status  Achieved      PT SHORT TERM GOAL #4   Title  pain in left hip decreased >/= 25% with movement of left hip    Status  Achieved        PT Long Term Goals - 06/05/19 1003      PT LONG TERM GOAL #1   Title  independent with HEP and understand how to progress herself    Time  8    Period  Weeks    Status  On-going       PT LONG TERM GOAL #2   Title  abilty to ambulate 6 minutes without a rest due to improved endurance and gait with least restrictive device    Time  8    Period  Weeks    Status  On-going      PT LONG TERM GOAL #3   Title  abilty to go up and down her steps with step over step pattern due to increased bilateral lower extremity strength >/= 4+/5    Time  8    Period  Weeks    Status  On-going      PT LONG TERM GOAL #4   Title  able to bring left leg onto the bed without assistance due to decreased left hip pain >/= 70% and increased strength    Time  8    Period  Weeks    Status  On-going      PT LONG TERM GOAL #5   Title  TUG score </= 13 seconds to reduce her risk of falls in a public place without an assistive device due to improved balance    Status  Achieved            Plan - 06/12/19 0949    Clinical Impression Statement  The patient reports needs intermittent standing rest breaks secondary to shortness of breath associated with her COPD and mask-wearing.  She reports anterior thigh soreness intermittently but she is able to continue with a progression of LE strengthening.  No loss of balance with moderate challenges in obstacle course negotiation.  Therapist providing close supervision for safety and to monitor response to all interventions.    Comorbidities  s/p left hip anterior hip replacement 04/10/2019; s/p non Hodgkins Lymphoma; COPD    Rehab Potential  Excellent    PT Frequency  2x / week    PT Duration  8 weeks    PT Treatment/Interventions  Cryotherapy;Electrical Stimulation;Moist Heat;Balance training;Therapeutic exercise;Therapeutic activities;Gait training;Stair training;Neuromuscular re-education;Patient/family education;Dry needling;Passive range of motion;Scar mobilization;Manual techniques    PT Next Visit Plan  carrying weighted object;  gentle progression of strength;    retro weight shifting with band;  sit to stand;  step ups;  leg press;   reciprocal   stair climbing; balance activity putting things on shelves    PT Home Exercise Plan  Access Code: [email or web address]       Patient will benefit from skilled therapeutic intervention in order to improve the following deficits and impairments:  Abnormal gait, Decreased range of motion, Difficulty walking, Increased fascial restricitons, Decreased endurance, Increased muscle spasms, Decreased activity tolerance, Pain, Decreased balance, Decreased scar mobility, Impaired flexibility, Decreased strength, Decreased mobility  Visit Diagnosis: Pain in left hip  Muscle weakness (generalized)  Other abnormalities of gait and mobility  S/p left hip fracture     Problem List Patient Active Problem List   Diagnosis Date Noted  . Hypomagnesemia   . Hypokalemia   . Prolonged Q-T interval on ECG   . Femur fracture, left ([place]) 04/15/2019  . Chronic obstructive pulmonary disease ([place])   . Acute blood loss anemia   . Drug induced constipation   . Status post total hip replacement, left   . Supplemental oxygen dependent   . Postoperative pain   . Closed left hip fracture, initial encounter ([place]) 04/10/2019  . Other fatigue 04/09/2019  . Leukopenia due to antineoplastic chemotherapy ([place]) 01/25/2019  . Bilateral arm pain 12/14/2018  . Pancytopenia, acquired ([place]) 06/20/2018  . Weight loss 06/20/2018  . Physical deconditioning 04/10/2018  . Peripheral neuropathy due to chemotherapy ([place]) 03/29/2018  . Hypotension, unspecified 03/29/2018  . Chronic respiratory failure with hypoxia ([place]) 12/30/2017  . Other constipation 12/29/2017  . Neutropenic fever ([place]) 12/18/2017  . Thrombocytopenia ([place]) 12/18/2017  . Abdominal fullness 12/18/2017  . Acute urinary retention 12/18/2017  . Mucositis due to antineoplastic therapy 12/15/2017  . Heart burn 12/15/2017  . Frequent headaches 12/15/2017  . 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  . Goals of care, counseling/discussion 11/29/2017  . Pathological  fracture in neoplastic disease, right humerus, initial encounter for fracture 11/24/2017  . Hyperuricemia 11/22/2017  . Pathological fracture due to neoplastic disease, initial encounter 11/21/2017  . COPD GOLD II 07/05/2017  . COLONIC POLYPS, HYPERPLASTIC, HX OF 05/28/2010  . 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[place]) 11/27/2007  . Depression 05/05/2007   Ruben Im, PT 06/12/19 9:56 AM Phone: [phone] Fax: [phone] Alvera Singh 06/12/2019, 9:55 AM  [facility] [address] [place], [place], [address] Phone: [phone]   Fax:  [phone]  Name: BRITTNEA STUKEL MRN: [id number] Date of Birth: [birth year]

## 2019-06-14 ENCOUNTER — Ambulatory Visit: Payer: Medicare Other | Admitting: Physical Therapy

## 2019-06-14 ENCOUNTER — Other Ambulatory Visit: Payer: Self-pay

## 2019-06-14 DIAGNOSIS — M25552 Pain in left hip: Secondary | ICD-10-CM

## 2019-06-14 DIAGNOSIS — M6281 Muscle weakness (generalized): Secondary | ICD-10-CM

## 2019-06-14 DIAGNOSIS — R2689 Other abnormalities of gait and mobility: Secondary | ICD-10-CM | POA: Diagnosis not present

## 2019-06-14 DIAGNOSIS — Z8781 Personal history of (healed) traumatic fracture: Secondary | ICD-10-CM | POA: Diagnosis not present

## 2019-06-14 NOTE — Therapy (Signed)
[facility] [address], [place], [place], [address] Phone: [phone]   Fax:  [phone]  Physical Therapy Treatment  Patient Details  Name: Brooke Washington MRN: [id number] Date of Birth: March 13, 1941 Referring Provider (PT): Reesa Chew [place]   Encounter Date: 06/14/2019  PT End of Session - 06/14/19 1010    Visit Number  13    Date for PT Re-Evaluation  06/27/19    Authorization Type  Medicare    PT Start Time  0931    PT Stop Time  1011    PT Time Calculation (min)  40 min    Activity Tolerance  Patient limited by pain       Past Medical History:  Diagnosis Date  . Cancer ([place])  APRIL OF 2000   LOW-GRADE NON-HODGKIN'S LYMPHOMA  . COPD (chronic obstructive pulmonary disease) ([place])   . Depression   . Emphysema of lung ([place])   . GERD (gastroesophageal reflux disease)   . Hyperlipidemia   . S/p left hip fracture 04/15/2019    Past Surgical History:  Procedure Laterality Date  . 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 FRACTURE SURGERY    . IR FLUORO GUIDE PORT INSERTION RIGHT  12/07/2017  . IR US GUIDE VASC ACCESS RIGHT  12/07/2017  . ORIF HUMERUS FRACTURE Right 11/23/2017   Procedure: OPEN REDUCTION INTERNAL FIXATION (ORIF) HUMERAL SHAFT FRACTURE;  Surgeon: Shona Needles, MD;  Location: [facility];  Service: Orthopedics;  Laterality: Right;  . RIGHT OOPHORECTOMY  1976  . 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   Procedure: TOTAL HIP ARTHROPLASTY ANTERIOR APPROACH LEFT HIP;  Surgeon: Rod Can, MD;  Location: [facility];  Service: Orthopedics;  Laterality: Left;    There were no vitals filed for this visit.  Subjective Assessment - 06/14/19 0944    Subjective  Patient got appt times confused and arrived > 1 hour early.  My leg was really sore which she attributes to trying more weight on the leg press.  I've been using my home massager over  the scar.  It hurts sometimes all the way to my knee.  With sitting 0/10 pain.  With sit to stand 8/10 but gets better after the first few steps.    Pertinent History  COPD    Currently in Pain?  Yes    Pain Score  0-No pain    Pain Orientation  Left                   Nu-Step L3 8 min with 15 sec rest breaks every 1 1/2 minutes while therapist discussing status    [facility] Adult PT Treatment/Exercise - 06/14/19 0001      Ambulation/Gait   Pre-Gait Activities  dead lift 8# 15x    Gait Comments  carrying 5# weight 100 feet      Knee/Hip Exercises: Stretches   Hip Flexor Stretch Limitations  2nd step hip flexor stretch right/left 10x each       Knee/Hip Exercises: Machines for Strengthening   Cybex Leg Press  seat 8 55# bil 15x;  left only 30# discontinued after 8 reps painful today        Knee/Hip Exercises: Standing   Forward Step Up  Right;Left;1 set;10 reps;Hand Hold: 2;Step Height: 6"    Other Standing Knee Exercises  side and forward stepping over 4 inch height obstacle 5x each  Other Standing Knee Exercises  sidestepping no UE support in front of counter  1 minute      Knee/Hip Exercises: Seated   Sit to Sand  --   very high table with 8# weight 10x              PT Short Term Goals - 06/05/19 1003      PT SHORT TERM GOAL #1   Title  understand tips to avoid falls    Status  Achieved      PT SHORT TERM GOAL #2   Title  independent with initial HEP    Status  Achieved      PT SHORT TERM GOAL #3   Title  TUG score </= 15 seconds with rolling walker    Status  Achieved      PT SHORT TERM GOAL #4   Title  pain in left hip decreased >/= 25% with movement of left hip    Status  Achieved        PT Long Term Goals - 06/05/19 1003      PT LONG TERM GOAL #1   Title  independent with HEP and understand how to progress herself    Time  8    Period  Weeks    Status  On-going      PT LONG TERM GOAL #2   Title  abilty to ambulate 6 minutes  without a rest due to improved endurance and gait with least restrictive device    Time  8    Period  Weeks    Status  On-going      PT LONG TERM GOAL #3   Title  abilty to go up and down her steps with step over step pattern due to increased bilateral lower extremity strength >/= 4+/5    Time  8    Period  Weeks    Status  On-going      PT LONG TERM GOAL #4   Title  able to bring left leg onto the bed without assistance due to decreased left hip pain >/= 70% and increased strength    Time  8    Period  Weeks    Status  On-going      PT LONG TERM GOAL #5   Title  TUG score </= 13 seconds to reduce her risk of falls in a public place without an assistive device due to improved balance    Status  Achieved            Plan - 06/14/19 1012    Clinical Impression Statement  The patient reports continued left thigh pain with sit to stand and the first few steps but the discomfort usually dissipates with repetition or after walkng a few steps.  Last visit she increased her single leg resistance on her own to 50# (15# increase than she had previously done) and felt that that increased her soreness yesterday.  Today we modified the weight to 30# but discontinued after 8 reps secondary to pain.  Therapist closely monitoring pain intensity and modifying as needed.    Comorbidities  s/p left hip anterior hip replacement 04/10/2019; s/p non Hodgkins Lymphoma; COPD    Rehab Potential  Excellent    PT Frequency  2x / week    PT Duration  8 weeks    PT Treatment/Interventions  Cryotherapy;Electrical Stimulation;Moist Heat;Balance training;Therapeutic exercise;Therapeutic activities;Gait training;Stair training;Neuromuscular re-education;Patient/family education;Dry needling;Passive range of motion;Scar mobilization;Manual techniques    PT Next Visit  Plan  carrying weighted object;  gentle progression of strength;   step ups;  leg press;   reciprocal  stair climbing; balance    PT Home Exercise Plan   Access Code: [email or web address]       Patient will benefit from skilled therapeutic intervention in order to improve the following deficits and impairments:  Abnormal gait, Decreased range of motion, Difficulty walking, Increased fascial restricitons, Decreased endurance, Increased muscle spasms, Decreased activity tolerance, Pain, Decreased balance, Decreased scar mobility, Impaired flexibility, Decreased strength, Decreased mobility  Visit Diagnosis: Pain in left hip  Muscle weakness (generalized)  Other abnormalities of gait and mobility  S/p left hip fracture     Problem List Patient Active Problem List   Diagnosis Date Noted  . Hypomagnesemia   . Hypokalemia   . Prolonged Q-T interval on ECG   . Femur fracture, left ([place]) 04/15/2019  . Chronic obstructive pulmonary disease ([place])   . Acute blood loss anemia   . Drug induced constipation   . Status post total hip replacement, left   . Supplemental oxygen dependent   . Postoperative pain   . Closed left hip fracture, initial encounter ([place]) 04/10/2019  . Other fatigue 04/09/2019  . Leukopenia due to antineoplastic chemotherapy ([place]) 01/25/2019  . Bilateral arm pain 12/14/2018  . Pancytopenia, acquired ([place]) 06/20/2018  . Weight loss 06/20/2018  . Physical deconditioning 04/10/2018  . Peripheral neuropathy due to chemotherapy ([place]) 03/29/2018  . Hypotension, unspecified 03/29/2018  . Chronic respiratory failure with hypoxia ([place]) 12/30/2017  . Other constipation 12/29/2017  . Neutropenic fever ([place]) 12/18/2017  . Thrombocytopenia ([place]) 12/18/2017  . Abdominal fullness 12/18/2017  . Acute urinary retention 12/18/2017  . Mucositis due to antineoplastic therapy 12/15/2017  . Heart burn 12/15/2017  . Frequent headaches 12/15/2017  . 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 Goals of care, counseling/discussion 11/29/2017  . Pathological fracture in neoplastic disease, right humerus, initial encounter for fracture  11/24/2017  . Hyperuricemia 11/22/2017  . Pathological fracture due to neoplastic disease, initial encounter 11/21/2017  . COPD GOLD II 07/05/2017  . COLONIC POLYPS, HYPERPLASTIC, HX OF 05/28/2010  . 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 Depression 05/05/2007   Ruben Im, PT 06/14/19 10:32 AM Phone: [phone] Fax: [phone] Alvera Singh 06/14/2019, 10:30 AM  [facility] [address], [place], [place], [address] Phone: [phone]   Fax:  [phone]  Name: Brooke Washington MRN: [id number] Date of Birth: 17-Dec-1940

## 2019-06-19 ENCOUNTER — Encounter: Payer: Self-pay | Admitting: Physical Therapy

## 2019-06-19 ENCOUNTER — Other Ambulatory Visit: Payer: Self-pay

## 2019-06-19 ENCOUNTER — Ambulatory Visit: Payer: Medicare Other | Admitting: Physical Therapy

## 2019-06-19 DIAGNOSIS — R2689 Other abnormalities of gait and mobility: Secondary | ICD-10-CM | POA: Diagnosis not present

## 2019-06-19 DIAGNOSIS — M25552 Pain in left hip: Secondary | ICD-10-CM

## 2019-06-19 DIAGNOSIS — Z8781 Personal history of (healed) traumatic fracture: Secondary | ICD-10-CM | POA: Diagnosis not present

## 2019-06-19 DIAGNOSIS — M6281 Muscle weakness (generalized): Secondary | ICD-10-CM

## 2019-06-19 NOTE — Therapy (Signed)
[facility] [address] [place], [place], [address] Phone: [phone]   Fax:  [phone]  Physical Therapy Treatment  Patient Details  Name: Brooke Washington MRN: [id number] Date of Birth: 19-May-1941 Referring Provider (PT): Reesa Chew [place]   Encounter Date: 06/19/2019  PT End of Session - 06/19/19 1019    Visit Number  14    Date for PT Re-Evaluation  06/27/19    Authorization Type  Medicare    PT Start Time  0945    PT Stop Time  1025    PT Time Calculation (min)  40 min    Activity Tolerance  Patient tolerated treatment well       Past Medical History:  Diagnosis Date  . Cancer ([place])  APRIL OF 2000   LOW-GRADE NON-HODGKIN'S LYMPHOMA  . COPD (chronic obstructive pulmonary disease) ([place])   . Depression   . Emphysema of lung ([place])   . GERD (gastroesophageal reflux disease)   . Hyperlipidemia   . S/p left hip fracture 04/15/2019    Past Surgical History:  Procedure Laterality Date  . 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 FRACTURE SURGERY    . IR FLUORO GUIDE PORT INSERTION RIGHT  12/07/2017  . IR US GUIDE VASC ACCESS RIGHT  12/07/2017  . ORIF HUMERUS FRACTURE Right 11/23/2017   Procedure: OPEN REDUCTION INTERNAL FIXATION (ORIF) HUMERAL SHAFT FRACTURE;  Surgeon: Shona Needles, MD;  Location: [facility];  Service: Orthopedics;  Laterality: Right;  . RIGHT OOPHORECTOMY  1976  . 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   Procedure: TOTAL HIP ARTHROPLASTY ANTERIOR APPROACH LEFT HIP;  Surgeon: Rod Can, MD;  Location: [facility];  Service: Orthopedics;  Laterality: Left;    There were no vitals filed for this visit.  Subjective Assessment - 06/19/19 0947    Subjective  I took a couple days rest and that helped.  It was stiff  this morning but got better after morning it was 6-7/10 but now no pain.    Currently in Pain?  No/denies    Pain  Score  0-No pain    Pain Orientation  Left    Pain Type  Acute pain                       [facility] Adult PT Treatment/Exercise - 06/19/19 0001      Therapeutic Activites    ADL's  sit to stand, carrying, picking up objects from floor    Other Therapeutic Activities  walking, standing, maneuvering left LE to simulate getting in/out of the car; stepping over obstacles       Knee/Hip Exercises: Stretches   Hip Flexor Stretch Limitations  2nd step hip flexor stretch right/left 10x each       Knee/Hip Exercises: Aerobic   Nustep  L2 8 min while discussing status    no rest breaks needed today      Knee/Hip Exercises: Machines for Strengthening   Cybex Leg Press  seat 8 55# 15x; 30# left/right  15x       Knee/Hip Exercises: Standing   Hip Abduction  Stengthening;Right;Left;1 set;10 reps    Abduction Limitations  2#     Hip Extension  Stengthening;Right;Left;1 set;10 reps    Extension Limitations  2#     Forward Step Up  Right;Left;1 set;10 reps;Hand Hold: 2;Step Height: 6"    SLS with Vectors  small ball kicking 5x right/left     Other Standing Knee Exercises  step taps 1 minute right/left     Other Standing Knee Exercises  carrying 5# weight 3 minutes       Knee/Hip Exercises: Seated   Long Arc Quad  Strengthening;Right;Left;15 reps;Weights    Long Arc Quad Weight  4 lbs.    Clamshell with TheraBand  Green   20x    Hamstring Curl  Strengthening;Right;Left;15 reps               PT Short Term Goals - 06/05/19 1003      PT SHORT TERM GOAL #1   Title  understand tips to avoid falls    Status  Achieved      PT SHORT TERM GOAL #2   Title  independent with initial HEP    Status  Achieved      PT SHORT TERM GOAL #3   Title  TUG score </= 15 seconds with rolling walker    Status  Achieved      PT SHORT TERM GOAL #4   Title  pain in left hip decreased >/= 25% with movement of left hip    Status  Achieved        PT Long Term Goals - 06/05/19 1003       PT LONG TERM GOAL #1   Title  independent with HEP and understand how to progress herself    Time  8    Period  Weeks    Status  On-going      PT LONG TERM GOAL #2   Title  abilty to ambulate 6 minutes without a rest due to improved endurance and gait with least restrictive device    Time  8    Period  Weeks    Status  On-going      PT LONG TERM GOAL #3   Title  abilty to go up and down her steps with step over step pattern due to increased bilateral lower extremity strength >/= 4+/5    Time  8    Period  Weeks    Status  On-going      PT LONG TERM GOAL #4   Title  able to bring left leg onto the bed without assistance due to decreased left hip pain >/= 70% and increased strength    Time  8    Period  Weeks    Status  On-going      PT LONG TERM GOAL #5   Title  TUG score </= 13 seconds to reduce her risk of falls in a public place without an assistive device due to improved balance    Status  Achieved            Plan - 06/19/19 1026    Clinical Impression Statement  The patient with fewer complaints of left thigh pain today compared to last visit.  She is able to resume resistive strengthening without exacerbation of pain.  She does need several standing rest breaks between exercises secondary to difficulty breathing with the mask on and COPD.    Therapist providing close supervision for safety secondary to fall risk and monitoring for excessive pain.    Comorbidities  s/p left hip anterior hip replacement 04/10/2019; s/p non Hodgkins Lymphoma; COPD    Rehab Potential  Excellent    PT Frequency  2x / week    PT Duration  8 weeks    PT Treatment/Interventions  Cryotherapy;Electrical Stimulation;Moist  Heat;Balance training;Therapeutic exercise;Therapeutic activities;Gait training;Stair training;Neuromuscular re-education;Patient/family education;Dry needling;Passive range of motion;Scar mobilization;Manual techniques    PT Next Visit Plan  KX modifier;  carrying weighted  object;  gentle progression of strength;   step ups;  leg press;   reciprocal  stair climbing; balance       Patient will benefit from skilled therapeutic intervention in order to improve the following deficits and impairments:  Abnormal gait, Decreased range of motion, Difficulty walking, Increased fascial restricitons, Decreased endurance, Increased muscle spasms, Decreased activity tolerance, Pain, Decreased balance, Decreased scar mobility, Impaired flexibility, Decreased strength, Decreased mobility  Visit Diagnosis: Pain in left hip  Muscle weakness (generalized)  Other abnormalities of gait and mobility  S/p left hip fracture     Problem List Patient Active Problem List   Diagnosis Date Noted  . Hypomagnesemia   . Hypokalemia   . Prolonged Q-T interval on ECG   . Femur fracture, left ([place]) 04/15/2019  . Chronic obstructive pulmonary disease ([place])   . Acute blood loss anemia   . Drug induced constipation   . Status post total hip replacement, left   . Supplemental oxygen dependent   . Postoperative pain   . Closed left hip fracture, initial encounter ([place]) 04/10/2019  . Other fatigue 04/09/2019  . Leukopenia due to antineoplastic chemotherapy ([place]) 01/25/2019  . Bilateral arm pain 12/14/2018  . Pancytopenia, acquired ([place]) 06/20/2018  . Weight loss 06/20/2018  . Physical deconditioning 04/10/2018  . Peripheral neuropathy due to chemotherapy ([place]) 03/29/2018  . Hypotension, unspecified 03/29/2018  . Chronic respiratory failure with hypoxia ([place]) 12/30/2017  . Other constipation 12/29/2017  . Neutropenic fever ([place]) 12/18/2017  . Thrombocytopenia ([place]) 12/18/2017  . Abdominal fullness 12/18/2017  . Acute urinary retention 12/18/2017  . Mucositis due to antineoplastic therapy 12/15/2017  . Heart burn 12/15/2017  . Frequent headaches 12/15/2017  . 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 Goals of care, counseling/discussion 11/29/2017  . Pathological  fracture in neoplastic disease, right humerus, initial encounter for fracture 11/24/2017  . Hyperuricemia 11/22/2017  . Pathological fracture due to neoplastic disease, initial encounter 11/21/2017  . COPD GOLD II 07/05/2017  . COLONIC POLYPS, HYPERPLASTIC, HX OF 05/28/2010  . 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 Depression 05/05/2007   Ruben Im, PT 06/19/19 11:15 AM Phone: [phone] Fax: [phone] Alvera Singh 06/19/2019, 11:15 AM  [facility] [address] [place], [place], [address] Phone: [phone]   Fax:  [phone]  Name: Brooke Washington MRN: [id number] Date of Birth: [birth year]

## 2019-06-21 ENCOUNTER — Encounter: Payer: Self-pay | Admitting: Physical Therapy

## 2019-06-21 ENCOUNTER — Ambulatory Visit: Payer: Medicare Other | Admitting: Physical Therapy

## 2019-06-21 ENCOUNTER — Other Ambulatory Visit: Payer: Self-pay

## 2019-06-21 DIAGNOSIS — Z8781 Personal history of (healed) traumatic fracture: Secondary | ICD-10-CM | POA: Diagnosis not present

## 2019-06-21 DIAGNOSIS — M6281 Muscle weakness (generalized): Secondary | ICD-10-CM

## 2019-06-21 DIAGNOSIS — R2689 Other abnormalities of gait and mobility: Secondary | ICD-10-CM | POA: Diagnosis not present

## 2019-06-21 DIAGNOSIS — M25552 Pain in left hip: Secondary | ICD-10-CM | POA: Diagnosis not present

## 2019-06-21 NOTE — Therapy (Signed)
[facility] [address], [place], [place], [address] Phone: [phone]   Fax:  [phone]  Physical Therapy Treatment  Patient Details  Name: Brooke Washington MRN: [id number] Date of Birth: 06/21/1941 Referring Provider (PT): Reesa Chew [place]   Encounter Date: 06/21/2019  PT End of Session - 06/21/19 0924    Visit Number  15    Date for PT Re-Evaluation  06/27/19    Authorization Type  Medicare    PT Start Time  [id number]    PT Stop Time  0927    PT Time Calculation (min)  40 min    Activity Tolerance  Patient tolerated treatment well       Past Medical History:  Diagnosis Date  . Cancer ([place])  APRIL OF 2000   LOW-GRADE NON-HODGKIN'S LYMPHOMA  . COPD (chronic obstructive pulmonary disease) ([place])   . Depression   . Emphysema of lung ([place])   . GERD (gastroesophageal reflux disease)   . Hyperlipidemia   . S/p left hip fracture 04/15/2019    Past Surgical History:  Procedure Laterality Date  . 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 FRACTURE SURGERY    . IR FLUORO GUIDE PORT INSERTION RIGHT  12/07/2017  . IR US GUIDE VASC ACCESS RIGHT  12/07/2017  . ORIF HUMERUS FRACTURE Right 11/23/2017   Procedure: OPEN REDUCTION INTERNAL FIXATION (ORIF) HUMERAL SHAFT FRACTURE;  Surgeon: Shona Needles, MD;  Location: [place];  Service: Orthopedics;  Laterality: Right;  . RIGHT OOPHORECTOMY  1976  . 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   Procedure: TOTAL HIP ARTHROPLASTY ANTERIOR APPROACH LEFT HIP;  Surgeon: Rod Can, MD;  Location: [place];  Service: Orthopedics;  Laterality: Left;    There were no vitals filed for this visit.  Subjective Assessment - 06/21/19 0847    Subjective  I overslept this morning and didn't have my coffee.  I was really sore after last time.  Sees MD in December.    Pertinent History  COPD    Patient Stated Goals  be able to  walk with less pain and no walker, improve balance; fully straighten the left knee    Currently in Pain?  No/denies    Pain Score  0-No pain    Pain Orientation  Left    Pain Type  Acute pain                       [facility] Adult PT Treatment/Exercise - 06/21/19 0001      Therapeutic Activites    ADL's  sit to stand, carrying, picking up objects from floor    Other Therapeutic Activities  walking, standing, maneuvering left LE to simulate getting in/out of the car; stepping over obstacles       Knee/Hip Exercises: Stretches   Hip Flexor Stretch Limitations  2nd step hip flexor stretch right/left 10x each       Knee/Hip Exercises: Aerobic   Nustep  L2 8 min while discussing status    no rest breaks needed today      Knee/Hip Exercises: Machines for Strengthening   Cybex Leg Press  seat 8 reclined 55# 20x; 30# left/right  20x       Knee/Hip Exercises: Standing   Hip Abduction  Stengthening;Right;Left;1 set;10 reps    Abduction Limitations  2#     Hip Extension  Stengthening;Right;Left;1 set;10 reps  Extension Limitations  2#     Forward Step Up  Right;Left;1 set;10 reps;Hand Hold: 2;Step Height: 6"    Walking with Sports Cord  retro stepping with blue band handles 5x; walking backward holding blue band handles 5x     Other Standing Knee Exercises  step taps with 2# ankle weights 10x  right/left     Other Standing Knee Exercises  carrying 5# weight 2 minutes; sidestepping 2 minutes carrying 5# weight       Knee/Hip Exercises: Seated   Long Arc Quad  Strengthening;Left;20 reps;Weights    Long Arc Quad Weight  4 lbs.    Clamshell with TheraBand  Green   20x    Other Seated Knee/Hip Exercises  chair sit ups holding 5# weight 10x     Hamstring Curl  Strengthening;Left;20 reps               PT Short Term Goals - 06/05/19 1003      PT SHORT TERM GOAL #1   Title  understand tips to avoid falls    Status  Achieved      PT SHORT TERM GOAL #2   Title   independent with initial HEP    Status  Achieved      PT SHORT TERM GOAL #3   Title  TUG score </= 15 seconds with rolling walker    Status  Achieved      PT SHORT TERM GOAL #4   Title  pain in left hip decreased >/= 25% with movement of left hip    Status  Achieved        PT Long Term Goals - 06/05/19 1003      PT LONG TERM GOAL #1   Title  independent with HEP and understand how to progress herself    Time  8    Period  Weeks    Status  On-going      PT LONG TERM GOAL #2   Title  abilty to ambulate 6 minutes without a rest due to improved endurance and gait with least restrictive device    Time  8    Period  Weeks    Status  On-going      PT LONG TERM GOAL #3   Title  abilty to go up and down her steps with step over step pattern due to increased bilateral lower extremity strength >/= 4+/5    Time  8    Period  Weeks    Status  On-going      PT LONG TERM GOAL #4   Title  able to bring left leg onto the bed without assistance due to decreased left hip pain >/= 70% and increased strength    Time  8    Period  Weeks    Status  On-going      PT LONG TERM GOAL #5   Title  TUG score </= 13 seconds to reduce her risk of falls in a public place without an assistive device due to improved balance    Status  Achieved            Plan - 06/21/19 0937    Clinical Impression Statement  The patient continues to complain of left anterior thigh and lateral hip soreness but states it improves "after it gets warmed up" but she does report next day soreness after PT sessions.  Close supervision needed for retro stepping and walking backwards secondary lack of distal motor control most likely associated  with her neuropathy.  She needs therapist assist several times from going too far backwards when shifting her weight in the posterior direction.    Comorbidities  s/p left hip anterior hip replacement 04/10/2019; s/p non Hodgkins Lymphoma; COPD    Rehab Potential  Excellent    PT  Frequency  2x / week    PT Duration  8 weeks    PT Treatment/Interventions  Cryotherapy;Electrical Stimulation;Moist Heat;Balance training;Therapeutic exercise;Therapeutic activities;Gait training;Stair training;Neuromuscular re-education;Patient/family education;Dry needling;Passive range of motion;Scar mobilization;Manual techniques    PT Next Visit Plan  KX modifier;  ERO next visit; six minute walk test;   carrying weighted object;  gentle progression of strength;   step ups;  leg press;   reciprocal  stair climbing; balance    PT Home Exercise Plan  Access Code: [email or web address]       Patient will benefit from skilled therapeutic intervention in order to improve the following deficits and impairments:     Visit Diagnosis: Pain in left hip  Muscle weakness (generalized)  Other abnormalities of gait and mobility  S/p left hip fracture     Problem List Patient Active Problem List   Diagnosis Date Noted  . Hypomagnesemia   . Hypokalemia   . Prolonged Q-T interval on ECG   . Femur fracture, left ([place]) 04/15/2019  . Chronic obstructive pulmonary disease ([place])   . Acute blood loss anemia   . Drug induced constipation   . Status post total hip replacement, left   . Supplemental oxygen dependent   . Postoperative pain   . Closed left hip fracture, initial encounter ([place]) 04/10/2019  . Other fatigue 04/09/2019  . Leukopenia due to antineoplastic chemotherapy ([place]) 01/25/2019  . Bilateral arm pain 12/14/2018  . Pancytopenia, acquired ([place]) 06/20/2018  . Weight loss 06/20/2018  . Physical deconditioning 04/10/2018  . Peripheral neuropathy due to chemotherapy ([place]) 03/29/2018  . Hypotension, unspecified 03/29/2018  . Chronic respiratory failure with hypoxia ([place]) 12/30/2017  . Other constipation 12/29/2017  . Neutropenic fever ([place]) 12/18/2017  . Thrombocytopenia ([place]) 12/18/2017  . Abdominal fullness 12/18/2017  . Acute urinary retention 12/18/2017  . Mucositis due to  antineoplastic therapy 12/15/2017  . Heart burn 12/15/2017  . Frequent headaches 12/15/2017  . 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 Goals of care, counseling/discussion 11/29/2017  . Pathological fracture in neoplastic disease, right humerus, initial encounter for fracture 11/24/2017  . Hyperuricemia 11/22/2017  . Pathological fracture due to neoplastic disease, initial encounter 11/21/2017  . COPD GOLD II 07/05/2017  . COLONIC POLYPS, HYPERPLASTIC, HX OF 05/28/2010  . 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 Depression 05/05/2007   Ruben Im, PT 06/21/19 9:42 AM Phone: [phone] Fax: [phone] Alvera Singh 06/21/2019, 9:41 AM  [facility] [address] [place], [place], [address] Phone: [phone]   Fax:  [phone]  Name: Brooke Washington MRN: [id number] Date of Birth: 12/14/1940

## 2019-06-26 ENCOUNTER — Other Ambulatory Visit: Payer: Self-pay

## 2019-06-26 ENCOUNTER — Ambulatory Visit: Payer: Medicare Other | Admitting: Physical Therapy

## 2019-06-26 DIAGNOSIS — Z8781 Personal history of (healed) traumatic fracture: Secondary | ICD-10-CM

## 2019-06-26 DIAGNOSIS — R2689 Other abnormalities of gait and mobility: Secondary | ICD-10-CM | POA: Diagnosis not present

## 2019-06-26 DIAGNOSIS — M6281 Muscle weakness (generalized): Secondary | ICD-10-CM | POA: Diagnosis not present

## 2019-06-26 DIAGNOSIS — M25552 Pain in left hip: Secondary | ICD-10-CM | POA: Diagnosis not present

## 2019-06-26 NOTE — Therapy (Signed)
[facility] [address], [place], [place], [address] Phone: [phone]   Fax:  [phone]  Physical Therapy Treatment/Recertification   Patient Details  Name: Brooke Washington MRN: [id number] Date of Birth: 1940/11/02 Referring Provider (PT): Reesa Chew [place]   Encounter Date: 06/26/2019  PT End of Session - 06/26/19 1535    Visit Number  16    Date for PT Re-Evaluation  07/10/19    Authorization Type  Medicare    PT Start Time  0858    PT Stop Time  0940    PT Time Calculation (min)  42 min    Activity Tolerance  Patient tolerated treatment well       Past Medical History:  Diagnosis Date  . Cancer ([place])  APRIL OF 2000   LOW-GRADE NON-HODGKIN'S LYMPHOMA  . COPD (chronic obstructive pulmonary disease) ([place])   . Depression   . Emphysema of lung ([place])   . GERD (gastroesophageal reflux disease)   . Hyperlipidemia   . S/p left hip fracture 04/15/2019    Past Surgical History:  Procedure Laterality Date  . 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 FRACTURE SURGERY    . IR FLUORO GUIDE PORT INSERTION RIGHT  12/07/2017  . IR US GUIDE VASC ACCESS RIGHT  12/07/2017  . ORIF HUMERUS FRACTURE Right 11/23/2017   Procedure: OPEN REDUCTION INTERNAL FIXATION (ORIF) HUMERAL SHAFT FRACTURE;  Surgeon: Shona Needles, MD;  Location: [place];  Service: Orthopedics;  Laterality: Right;  . RIGHT OOPHORECTOMY  1976  . 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   Procedure: TOTAL HIP ARTHROPLASTY ANTERIOR APPROACH LEFT HIP;  Surgeon: Rod Can, MD;  Location: [facility];  Service: Orthopedics;  Laterality: Left;    There were no vitals filed for this visit.  Subjective Assessment - 06/26/19 0904    Subjective  I feel it this morning on the outside of my hip.  Not on the front thigh anymore.    How long can you walk comfortably?  I just stay home, I don't know     Patient Stated Goals  be able to walk with less pain and no walker, improve balance; fully straighten the left knee    Currently in Pain?  Yes    Pain Score  6     Pain Location  Hip    Pain Orientation  Left    Aggravating Factors   mornings;  after sitting a few minutes         [facility] PT Assessment - 06/26/19 0001      Observation/Other Assessments   Focus on Therapeutic Outcomes (FOTO)   41% limitation       Strength   Overall Strength Comments  peripheral neuropathy in foot affects ankle foot motor control     Left Knee Flexion  4/5    Left Knee Extension  4/5      6 Minute Walk- Baseline   6 Minute Walk- Baseline  yes      6 minute walk test results    Aerobic Endurance Distance Walked  840    Endurance additional comments  moderately challenged  respiratory wise;  no walker    96% O2 sat rate     Timed Up and Go Test   Normal TUG (seconds)  14                   [facility] Adult  PT Treatment/Exercise - 06/26/19 0001      Therapeutic Activites    ADL's  bed mobility     Other Therapeutic Activities  walking, standing, maneuvering left LE to simulate getting in/out of the car; stepping over obstacles       Knee/Hip Exercises: Stretches   Active Hamstring Stretch Limitations  on 1st step 5x right/left     Hip Flexor Stretch Limitations   1st step hip flexor stretch right/left 10x each       Knee/Hip Exercises: Aerobic   Nustep  L2 5 min while discussing status    no rest breaks needed today      Knee/Hip Exercises: Machines for Strengthening   Cybex Leg Press  seat 8 reclined 60# 15x; 30# left/right  20x       Knee/Hip Exercises: Standing   Other Standing Knee Exercises  see HEP       Knee/Hip Exercises: Seated   Sit to Sand  --   need UE assist with sit to stand             PT Education - 06/26/19 0939    Education Details  Access Code: [email or web address]   SLS, side stepping; step ups    Person(s) Educated  Patient    Methods   Explanation;Demonstration;Handout    Comprehension  Returned demonstration;Verbalized understanding       PT Short Term Goals - 06/26/19 1541      PT SHORT TERM GOAL #1   Title  understand tips to avoid falls    Status  Achieved      PT SHORT TERM GOAL #2   Title  independent with initial HEP    Status  Achieved      PT SHORT TERM GOAL #3   Title  TUG score </= 15 seconds with rolling walker    Status  Achieved      PT SHORT TERM GOAL #4   Title  pain in left hip decreased >/= 25% with movement of left hip    Status  Achieved        PT Long Term Goals - 06/26/19 0912      PT LONG TERM GOAL #1   Title  independent with HEP and understand how to progress herself    Time  2    Period  Weeks    Status  On-going    Target Date  07/10/19      PT LONG TERM GOAL #2   Title  abilty to ambulate 6 minutes without a rest due to improved endurance and gait with least restrictive device    Status  Achieved      PT LONG TERM GOAL #3   Title  abilty to go up and down her steps with step over step pattern due to increased bilateral lower extremity strength >/= 4+/5    Status  On-going      PT LONG TERM GOAL #4   Title  able to bring left leg onto the bed without assistance due to decreased left hip pain >/= 70% and increased strength    Status  Achieved      PT LONG TERM GOAL #5   Title  TUG score </= 13 seconds to reduce her risk of falls in a public place without an assistive device due to improved balance    Status  Achieved            Plan - 06/26/19 0933    Clinical Impression Statement  The patient is able to complete the 6 minute walk test without stopping today 840 feet without an assistive device.   She has some unsteadiness secondary to peripheral neuropathy however she does not need therapist assist today for loss of balance.  We discussed a walking program starting at 5 minutes a day.  Although I recommend she use the RW for safety, she states she does not plan  to use it.  She agreed she would only go for walks with her husband providing stand by assist.  She has lateral hip pain especially in the mornings and after sitting and then rising.  The discomfort usually improves with stretching.  She rates her overall progress at 75%.  We discussed an additional 1-3 visits only to promote independence with her HEP including an appropriate progression over time.    Comorbidities  s/p left hip anterior hip replacement 04/10/2019; s/p non Hodgkins Lymphoma; COPD    Rehab Potential  Excellent    PT Frequency  2x / week    PT Duration  2 weeks    PT Treatment/Interventions  Cryotherapy;Electrical Stimulation;Moist Heat;Balance training;Therapeutic exercise;Therapeutic activities;Gait training;Stair training;Neuromuscular re-education;Patient/family education;Dry needling;Passive range of motion;Scar mobilization;Manual techniques    PT Next Visit Plan  KX modifier;  review HEP for prpbable discharge from PT;  carrying weighted object;  gentle progression of strength;   step ups;  leg press;   reciprocal  stair climbing; balance    PT Home Exercise Plan  Access Code: [email or web address]       Patient will benefit from skilled therapeutic intervention in order to improve the following deficits and impairments:  Abnormal gait, Decreased range of motion, Difficulty walking, Increased fascial restricitons, Decreased endurance, Increased muscle spasms, Decreased activity tolerance, Pain, Decreased balance, Decreased scar mobility, Impaired flexibility, Decreased strength, Decreased mobility  Visit Diagnosis: Pain in left hip - Plan: PT plan of care cert/re-cert  Muscle weakness (generalized) - Plan: PT plan of care cert/re-cert  Other abnormalities of gait and mobility - Plan: PT plan of care cert/re-cert  S/p left hip fracture - Plan: PT plan of care cert/re-cert     Problem List Patient Active Problem List   Diagnosis Date Noted  . Hypomagnesemia   . Hypokalemia   .  Prolonged Q-T interval on ECG   . Femur fracture, left ([place]) 04/15/2019  . Chronic obstructive pulmonary disease ([place])   . Acute blood loss anemia   . Drug induced constipation   . Status post total hip replacement, left   . Supplemental oxygen dependent   . Postoperative pain   . Closed left hip fracture, initial encounter ([place]) 04/10/2019  . Other fatigue 04/09/2019  . Leukopenia due to antineoplastic chemotherapy ([place]) 01/25/2019  . Bilateral arm pain 12/14/2018  . Pancytopenia, acquired ([place]) 06/20/2018  . Weight loss 06/20/2018  . Physical deconditioning 04/10/2018  . Peripheral neuropathy due to chemotherapy ([place]) 03/29/2018  . Hypotension, unspecified 03/29/2018  . Chronic respiratory failure with hypoxia ([place]) 12/30/2017  . Other constipation 12/29/2017  . Neutropenic fever ([place]) 12/18/2017  . Thrombocytopenia ([place]) 12/18/2017  . Abdominal fullness 12/18/2017  . Acute urinary retention 12/18/2017  . Mucositis due to antineoplastic therapy 12/15/2017  . Heart burn 12/15/2017  . Frequent headaches 12/15/2017  . 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 Goals of care, counseling/discussion 11/29/2017  . Pathological fracture in neoplastic disease, right humerus, initial encounter for fracture 11/24/2017  . Hyperuricemia 11/22/2017  . Pathological fracture due to neoplastic disease, initial encounter 11/21/2017  .  COPD GOLD II 07/05/2017  . COLONIC POLYPS, HYPERPLASTIC, HX OF 05/28/2010  . 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 Depression 05/05/2007   Ruben Im, PT 06/26/19 3:44 PM Phone: [phone] Fax: [phone] Alvera Singh 06/26/2019, 3:44 PM  [facility] [address], [place], [place], [address] Phone: [phone]   Fax:  [phone]  Name: Brooke Washington MRN: [id number] Date of Birth: [birth year]

## 2019-06-26 NOTE — Patient Instructions (Signed)
Access Code: [email or web address]  URL: [email or web address]  Date: 06/26/2019  Prepared by: Ruben Im   Exercises  Supine Heel Slides - 10 reps - 1 sets - 2x daily - 7x weekly  Supine Quadricep Sets - 10 reps - 1 sets - 5 sec hold - 2x daily - 7x weekly  Standing Heel Raise with Support - 10 reps - 1 sets - 2x daily - 7x weekly  Standing Hip Abduction with Counter Support - 10 reps - 1 sets - 2x daily - 7x weekly  Standing Hip Extension with Counter Support - 10 reps - 1 sets - 2x daily - 7x weekly  Seated Hamstring Stretch - 3 reps - 1 sets - 20 hold - 2x daily - 7x weekly  Seated Hip Adduction Isometrics with Ball - 10 reps - 2 sets - 5 hold - 2x daily - 7x weekly  Sit to Stand - 10 reps - 1 sets - 1x daily - 7x weekly  Standing Single Leg Stance with Counter Support - 3 reps - 1 sets - 1x daily - 7x weekly  Forward Step Up with Counter Support - 10 reps - 1 sets - 1x daily - 7x weekly  Side Stepping with Counter Support - 5 reps - 1 sets - 1x daily - 7x weekly

## 2019-06-28 ENCOUNTER — Ambulatory Visit: Payer: Medicare Other | Admitting: Physical Therapy

## 2019-06-28 ENCOUNTER — Encounter: Payer: Self-pay | Admitting: Physical Therapy

## 2019-06-28 ENCOUNTER — Other Ambulatory Visit: Payer: Self-pay

## 2019-06-28 DIAGNOSIS — M6281 Muscle weakness (generalized): Secondary | ICD-10-CM

## 2019-06-28 DIAGNOSIS — M25552 Pain in left hip: Secondary | ICD-10-CM | POA: Diagnosis not present

## 2019-06-28 DIAGNOSIS — R2689 Other abnormalities of gait and mobility: Secondary | ICD-10-CM | POA: Diagnosis not present

## 2019-06-28 DIAGNOSIS — Z8781 Personal history of (healed) traumatic fracture: Secondary | ICD-10-CM

## 2019-06-28 NOTE — Therapy (Signed)
[facility] [address] [place], [place], [address] Phone: [phone]   Fax:  [phone]  Physical Therapy Treatment/Discharge Summary   Patient Details  Name: Brooke Washington MRN: [id number] Date of Birth: May 22, 1941 Referring Provider (PT): Reesa Chew [place]   Encounter Date: 06/28/2019  PT End of Session - 06/28/19 0950    Visit Number  17    Date for PT Re-Evaluation  07/10/19    Authorization Type  Medicare    PT Start Time  0900    PT Stop Time  0945    PT Time Calculation (min)  45 min    Activity Tolerance  Patient tolerated treatment well       Past Medical History:  Diagnosis Date  . Cancer ([place])  APRIL OF 2000   LOW-GRADE NON-HODGKIN'S LYMPHOMA  . COPD (chronic obstructive pulmonary disease) ([place])   . Depression   . Emphysema of lung ([place])   . GERD (gastroesophageal reflux disease)   . Hyperlipidemia   . S/p left hip fracture 04/15/2019    Past Surgical History:  Procedure Laterality Date  . 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 FRACTURE SURGERY    . IR FLUORO GUIDE PORT INSERTION RIGHT  12/07/2017  . IR US GUIDE VASC ACCESS RIGHT  12/07/2017  . ORIF HUMERUS FRACTURE Right 11/23/2017   Procedure: OPEN REDUCTION INTERNAL FIXATION (ORIF) HUMERAL SHAFT FRACTURE;  Surgeon: Shona Needles, MD;  Location: [facility];  Service: Orthopedics;  Laterality: Right;  . RIGHT OOPHORECTOMY  1976  . 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   Procedure: TOTAL HIP ARTHROPLASTY ANTERIOR APPROACH LEFT HIP;  Surgeon: Rod Can, MD;  Location: [facility];  Service: Orthopedics;  Laterality: Left;    There were no vitals filed for this visit.  Subjective Assessment - 06/28/19 0903    Subjective  Today is my last day.   That hip is still sore.  Plans to walk with her husband when it stops raining.  My back hasn't hurt in 2 days.    Patient Stated  Goals  be able to walk with less pain and no walker, improve balance; fully straighten the left knee    Currently in Pain?  No/denies    Pain Score  0-No pain    Pain Location  Hip    Pain Orientation  Left         [facility] PT Assessment - 06/28/19 0001      Observation/Other Assessments   Focus on Therapeutic Outcomes (FOTO)   41% limitation       AROM   Left Knee Extension  0      Strength   Left Hip Flexion  4/5    Left Hip Extension  4/5    Left Hip ABduction  4/5    Left Knee Flexion  4/5    Left Knee Extension  4/5      6 minute walk test results    Aerobic Endurance Distance Walked  840      Timed Up and Go Test   Normal TUG (seconds)  14                   [facility] Adult PT Treatment/Exercise - 06/28/19 0001      Therapeutic Activites    ADL's  bed mobility     Other Therapeutic Activities  walking, standing, maneuvering left LE to simulate getting  in/out of the car; stepping over obstacles       Knee/Hip Exercises: Stretches   Active Hamstring Stretch  Right;Left;3 reps;30 seconds    Active Hamstring Stretch Limitations  seated 3x     Hip Flexor Stretch Limitations   1st step hip flexor stretch right/left 10x each       Knee/Hip Exercises: Aerobic   Nustep  L2 6 min while discussing progress/status       Knee/Hip Exercises: Machines for Strengthening   Cybex Leg Press  seat 8 reclined 60# 20x; 35# left/right  20x       Knee/Hip Exercises: Standing   Hip Abduction  Stengthening;Right;Left;1 set;10 reps    Hip Extension  Stengthening;Right;Left;1 set;10 reps    Forward Step Up  Right;Left;1 set;10 reps;Hand Hold: 2;Step Height: 6"    SLS  3x 10 sec holds      Knee/Hip Exercises: Seated   Sit to Sand  10 reps;without UE support   high table               PT Short Term Goals - 06/28/19 0956      PT SHORT TERM GOAL #1   Title  understand tips to avoid falls    Status  Achieved      PT SHORT TERM GOAL #2   Title  independent with  initial HEP    Status  Achieved      PT SHORT TERM GOAL #3   Title  TUG score </= 15 seconds with rolling walker    Status  Achieved      PT SHORT TERM GOAL #4   Title  pain in left hip decreased >/= 25% with movement of left hip    Status  Achieved        PT Long Term Goals - 06/28/19 0957      PT LONG TERM GOAL #1   Title  independent with HEP and understand how to progress herself    Status  Achieved      PT LONG TERM GOAL #2   Title  abilty to ambulate 6 minutes without a rest due to improved endurance and gait with least restrictive device    Status  Achieved      PT LONG TERM GOAL #3   Title  abilty to go up and down her steps with step over step pattern due to increased bilateral lower extremity strength >/= 4+/5    Status  Partially Met      PT LONG TERM GOAL #4   Title  able to bring left leg onto the bed without assistance due to decreased left hip pain >/= 70% and increased strength    Status  Achieved      PT LONG TERM GOAL #5   Title  TUG score </= 13 seconds to reduce her risk of falls in a public place without an assistive device due to improved balance    Status  Achieved            Plan - 06/28/19 0908    Clinical Impression Statement  The patient reports an overall improvement in pain/function at 75%.  She has full left knee and hip ROM now and left hip and knee strength grossly 4/5.  She is able to ascend and descend steps reciprocally with 2 hand rail assist for safety.  Her Timed up and Go test has improved from 26 sec with RW on eval to 14 sec without assistive device.  She reports  she walks around her home without the RW but uses it for safety in the community.  On the last visit, she was able to walk in the clinic 840 feet without the walker and without stopping.  She remains at risk for falls secondary to pre-existing peripheral neuropathy.  We reviewed her HEP and she demonstrates independence. Will discharge from PT at this time with majority of  goals met.    Comorbidities  s/p left hip anterior hip replacement 04/10/2019; s/p non Hodgkins Lymphoma; COPD    PT Frequency  2x / week    PT Duration  2 weeks    PT Treatment/Interventions  Cryotherapy;Electrical Stimulation;Moist Heat;Balance training;Therapeutic exercise;Therapeutic activities;Gait training;Stair training;Neuromuscular re-education;Patient/family education;Dry needling;Passive range of motion;Scar mobilization;Manual techniques    PT Home Exercise Plan  Access Code: [email or web address]       Patient will benefit from skilled therapeutic intervention in order to improve the following deficits and impairments:  Abnormal gait, Decreased range of motion, Difficulty walking, Increased fascial restricitons, Decreased endurance, Increased muscle spasms, Decreased activity tolerance, Pain, Decreased balance, Decreased scar mobility, Impaired flexibility, Decreased strength, Decreased mobility  Visit Diagnosis: Pain in left hip  Muscle weakness (generalized)  Other abnormalities of gait and mobility  S/p left hip fracture    PHYSICAL THERAPY DISCHARGE SUMMARY  Visits from Start of Care: 17  Current functional level related to goals / functional outcomes: See clinical impressions above   Remaining deficits: As above   Education / Equipment: HEP Plan: Patient agrees to discharge.  Patient goals were partially met. Patient is being discharged due to meeting the stated rehab goals.  ?????         Problem List Patient Active Problem List   Diagnosis Date Noted  . Hypomagnesemia   . Hypokalemia   . Prolonged Q-T interval on ECG   . Femur fracture, left ([place]) 04/15/2019  . Chronic obstructive pulmonary disease ([place])   . Acute blood loss anemia   . Drug induced constipation   . Status post total hip replacement, left   . Supplemental oxygen dependent   . Postoperative pain   . Closed left hip fracture, initial encounter ([place]) 04/10/2019  . Other fatigue 04/09/2019  .  Leukopenia due to antineoplastic chemotherapy ([place]) 01/25/2019  . Bilateral arm pain 12/14/2018  . Pancytopenia, acquired ([place]) 06/20/2018  . Weight loss 06/20/2018  . Physical deconditioning 04/10/2018  . Peripheral neuropathy due to chemotherapy ([place]) 03/29/2018  . Hypotension, unspecified 03/29/2018  . Chronic respiratory failure with hypoxia ([place]) 12/30/2017  . Other constipation 12/29/2017  . Neutropenic fever ([place]) 12/18/2017  . Thrombocytopenia ([place]) 12/18/2017  . Abdominal fullness 12/18/2017  . Acute urinary retention 12/18/2017  . Mucositis due to antineoplastic therapy 12/15/2017  . Heart burn 12/15/2017  . Frequent headaches 12/15/2017  . 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 Goals of care, counseling/discussion 11/29/2017  . Pathological fracture in neoplastic disease, right humerus, initial encounter for fracture 11/24/2017  . Hyperuricemia 11/22/2017  . Pathological fracture due to neoplastic disease, initial encounter 11/21/2017  . COPD GOLD II 07/05/2017  . COLONIC POLYPS, HYPERPLASTIC, HX OF 05/28/2010  . 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 Depression 05/05/2007   Ruben Im, PT 06/28/19 9:59 AM Phone: [phone] Fax: [phone] Alvera Singh 06/28/2019, 9:58 AM  [facility] [address] [place], [place], [address] Phone: [phone]   Fax:  [phone]  Name: Brooke Washington MRN: [id number] Date of  Birth: 1941/01/19

## 2019-07-03 ENCOUNTER — Encounter: Payer: Medicare Other | Admitting: Physical Therapy

## 2019-07-05 ENCOUNTER — Encounter: Payer: Medicare Other | Admitting: Physical Therapy

## 2019-07-06 ENCOUNTER — Encounter: Payer: Self-pay | Admitting: Adult Health

## 2019-07-06 DIAGNOSIS — Z23 Encounter for immunization: Secondary | ICD-10-CM | POA: Diagnosis not present

## 2019-07-10 ENCOUNTER — Encounter: Payer: Medicare Other | Admitting: Physical Therapy

## 2019-08-02 ENCOUNTER — Other Ambulatory Visit: Payer: Self-pay | Admitting: Family Medicine

## 2019-08-02 MED ORDER — SERTRALINE HCL 25 MG PO TABS: 25.0000 mg | ORAL_TABLET | Freq: Every day | ORAL | 1 refills | Status: DC

## 2019-08-06 ENCOUNTER — Inpatient Hospital Stay: Payer: Medicare Other

## 2019-08-06 ENCOUNTER — Inpatient Hospital Stay (HOSPITAL_BASED_OUTPATIENT_CLINIC_OR_DEPARTMENT_OTHER): Payer: Medicare Other | Admitting: Hematology and Oncology

## 2019-08-06 ENCOUNTER — Inpatient Hospital Stay: Payer: Medicare Other | Attending: Hematology and Oncology

## 2019-08-06 ENCOUNTER — Encounter: Payer: Self-pay | Admitting: Hematology and Oncology

## 2019-08-06 ENCOUNTER — Other Ambulatory Visit: Payer: Self-pay

## 2019-08-06 ENCOUNTER — Telehealth: Payer: Self-pay | Admitting: Hematology and Oncology

## 2019-08-06 VITALS — BP 149/79 | HR 83 | Temp 98.5°F | Resp 18 | Ht 64.0 in | Wt 126.7 lb

## 2019-08-06 DIAGNOSIS — Z8572 Personal history of non-Hodgkin lymphomas: Secondary | ICD-10-CM | POA: Diagnosis not present

## 2019-08-06 DIAGNOSIS — Z452 Encounter for adjustment and management of vascular access device: Secondary | ICD-10-CM | POA: Diagnosis not present

## 2019-08-06 DIAGNOSIS — C8588 Other specified types of non-Hodgkin lymphoma, lymph nodes of multiple sites: Secondary | ICD-10-CM

## 2019-08-06 DIAGNOSIS — M84521A Pathological fracture in neoplastic disease, right humerus, initial encounter for fracture: Secondary | ICD-10-CM

## 2019-08-06 DIAGNOSIS — M818 Other osteoporosis without current pathological fracture: Secondary | ICD-10-CM

## 2019-08-06 DIAGNOSIS — G620 Drug-induced polyneuropathy: Secondary | ICD-10-CM | POA: Insufficient documentation

## 2019-08-06 DIAGNOSIS — M810 Age-related osteoporosis without current pathological fracture: Secondary | ICD-10-CM | POA: Insufficient documentation

## 2019-08-06 DIAGNOSIS — E538 Deficiency of other specified B group vitamins: Secondary | ICD-10-CM | POA: Diagnosis not present

## 2019-08-06 DIAGNOSIS — T451X5A Adverse effect of antineoplastic and immunosuppressive drugs, initial encounter: Secondary | ICD-10-CM

## 2019-08-06 DIAGNOSIS — C8338 Diffuse large B-cell lymphoma, lymph nodes of multiple sites: Secondary | ICD-10-CM

## 2019-08-06 LAB — CBC WITH DIFFERENTIAL/PLATELET
Abs Immature Granulocytes: 0.03 10*3/uL (ref 0.00–0.07)
Basophils Absolute: 0 10*3/uL (ref 0.0–0.1)
Basophils Relative: 1 %
Eosinophils Absolute: 0.5 10*3/uL (ref 0.0–0.5)
Eosinophils Relative: 8 %
HCT: 36.8 % (ref 36.0–46.0)
Hemoglobin: 12.4 g/dL (ref 12.0–15.0)
Immature Granulocytes: 0 %
Lymphocytes Relative: 16 %
Lymphs Abs: 1.1 10*3/uL (ref 0.7–4.0)
MCH: 34.4 pg — ABNORMAL HIGH (ref 26.0–34.0)
MCHC: 33.7 g/dL (ref 30.0–36.0)
MCV: 102.2 fL — ABNORMAL HIGH (ref 80.0–100.0)
Monocytes Absolute: 1 10*3/uL (ref 0.1–1.0)
Monocytes Relative: 15 %
Neutro Abs: 4.1 10*3/uL (ref 1.7–7.7)
Neutrophils Relative %: 60 %
Platelets: 241 10*3/uL (ref 150–400)
RBC: 3.6 MIL/uL — ABNORMAL LOW (ref 3.87–5.11)
RDW: 13.5 % (ref 11.5–15.5)
WBC: 6.8 10*3/uL (ref 4.0–10.5)
nRBC: 0 % (ref 0.0–0.2)

## 2019-08-06 LAB — COMPREHENSIVE METABOLIC PANEL
ALT: 9 U/L (ref 0–44)
AST: 16 U/L (ref 15–41)
Albumin: 3.8 g/dL (ref 3.5–5.0)
Alkaline Phosphatase: 132 U/L — ABNORMAL HIGH (ref 38–126)
Anion gap: 10 (ref 5–15)
BUN: 13 mg/dL (ref 8–23)
CO2: 25 mmol/L (ref 22–32)
Calcium: 9.4 mg/dL (ref 8.9–10.3)
Chloride: 108 mmol/L (ref 98–111)
Creatinine, Ser: 0.71 mg/dL (ref 0.44–1.00)
GFR calc Af Amer: >60 mL/min (ref >60)
GFR calc non Af Amer: >60 mL/min (ref >60)
Glucose, Bld: 86 mg/dL (ref 70–99)
Potassium: 3.6 mmol/L (ref 3.5–5.1)
Sodium: 143 mmol/L (ref 135–145)
Total Bilirubin: 0.4 mg/dL (ref 0.3–1.2)
Total Protein: 6.1 g/dL — ABNORMAL LOW (ref 6.5–8.1)

## 2019-08-06 MED ORDER — SODIUM CHLORIDE 0.9% FLUSH
10.0000 mL | Freq: Once | INTRAVENOUS | Status: DC
  Filled 2019-08-06: qty 10

## 2019-08-06 MED ORDER — SODIUM CHLORIDE 0.9% FLUSH
10.0000 mL | Freq: Once | INTRAVENOUS | Status: AC
  Administered 2019-08-06: 09:00:00 10 mL
  Filled 2019-08-06: qty 10

## 2019-08-06 MED ORDER — HEPARIN SOD (PORK) LOCK FLUSH 100 UNIT/ML IV SOLN
500.0000 [IU] | Freq: Once | INTRAVENOUS | Status: AC
  Administered 2019-08-06: 09:00:00 500 [IU]
  Filled 2019-08-06: qty 5

## 2019-08-06 NOTE — Assessment & Plan Note (Signed)
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

## 2019-08-06 NOTE — Telephone Encounter (Signed)
Scheduled appt per 10/26 sch message - mailed reminder letter with appt date and time

## 2019-08-06 NOTE — Assessment & Plan Note (Signed)
The patient have history of pathological fracture and osteoporosis I will check her serum vitamin D in her next visit

## 2019-08-06 NOTE — Progress Notes (Signed)
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[facility]) I have reviewed the plan of care with the patient and her husband over the telephone The patient have no evidence of cancer recurrence She tolerated Revlimid poorly in the past with mucositis, weight loss and feeling unwell She is fully recovered from side effects of treatment since discontinuation of Revlimid She is still recovering from recent fall/hip fracture We will monitor for signs of relapse closely I will see her again in 8 weeks for further follow-up I plan to repeat CT imaging at the end of the year Due to history of IV contrast allergy, I will order CT scan without IV contrast  Peripheral neuropathy due to chemotherapy [facility]) 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 CT Abdomen Pelvis Wo Contrast    Standing Status:   Future    Standing Expiration Date:   09/09/2020    Order Specific Question:   ** REASON FOR EXAM (FREE TEXT)    Answer:   high risk lymphoma. No IV contrast but ok for oral contrast    Order Specific Question:   Preferred imaging location?    Answer:   [facility]    Order Specific Question:   Is Oral Contrast requested for this exam?    Answer:   Yes, Per Radiology protocol    Order Specific Question:   Radiology Contrast Protocol - do NOT remove file path    Answer:   \\[email or web address]  . CT Chest Wo Contrast    Standing Status:   Future    Standing Expiration  Date:   09/09/2020    Order Specific Question:   ** REASON FOR EXAM (FREE TEXT)    Answer:   high risk lymphoma. No IV contrast but ok for oral contrast    Order Specific Question:   Preferred imaging location?    Answer:   [facility]    Order Specific Question:   Radiology Contrast Protocol - do NOT remove file path    Answer:   \\[email or web address]  . CBC with Differential/Platelet    Standing Status:   Future    Standing Expiration Date:   09/09/2020  . Comprehensive metabolic panel    Standing Status:   Future    Standing Expiration Date:   09/09/2020  . Lactate dehydrogenase    Standing Status:   Future    Standing Expiration Date:   09/09/2020  . Uric acid    Standing Status:   Future    Standing Expiration Date:   09/09/2020  . Vitamin B12    Standing Status:   Future    Standing Expiration Date:   09/09/2020  . VITAMIN D 25 Hydroxy (Vit-D Deficiency, Fractures)    Standing Status:   Future    Standing Expiration Date:   09/09/2020    INTERVAL HISTORY: Please see below for problem oriented charting. 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 She was treated with chlorambucil in 2000.   --2007 - 09/2008.  Treated with single agent Rituxan.   --12/2008.  She had progressive  adenopathy in chest and abdomen and repeat biopsy showed low grade follicular and diffuse B cell NHL.   --01/2009 - 07/2009. She required left ureteral stent and was treated with Treanda/Rituxan x 5 cycles from 02-2009 thru 06-2009.    09/16/2014 Imaging   No lymphadenopathy in the chest, abdomen, or pelvis. The small right axillary lymph nodes seen on the previous exam are unchanged in the 18 month interval. Small to moderate hiatal hernia.   11/20/2017 Imaging   Mildly displaced and angulated pathologic fracture of the proximal humeral shaft. 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    11/22/2017 PET scan   New hypermetabolic lymphadenopathy within the chest and abdomen, consistent with recurrent lymphoma. New large hypermetabolic mass in the right hepatic lobe, consistent with lymphoma this involvement. New small hypermetabolic mass in the left kidney, which may be due to lymphomatous involvement although primary renal cell carcinoma cannot be excluded. Hypermetabolic mass in the right humerus with pathologic fracture, consistent with lymphomatous involvement. Small hypermetabolic foci in right posterior chest wall, also suspicious for recurrent lymphoma.   11/23/2017 Procedure   Bone, fragment(s), Right Humeral Shaft Reamings - ATYPICAL LYMPHOID INFILTRATE CONSISTENT WITH NON-HODGKIN'S B CELL LYMPHOMA. - SEE ONCOLOGY TABLE. Microscopic Comment LYMPHOMA Histologic type: Non-Hodgkins lymphoma, favor large cell type. Grade (if applicable): Favor high grade. Flow cytometry: No specimen is available for analysis. Immunohistochemical stains: BCL-2, BCL-6, CD3, CD5, CD10, CD15, CD20, CD30, CD34, CD43, LCA, CD79a, CD138, Ki-67, PAX-5 and TdT with appropriate controls as performed on block 1C. Touch preps/imprints: Not performed. Comments: The sections show multiple, primarily soft tissue fragments displaying a dense lymphoid infiltrate characterized by a mixture of  small round to slightly irregular lymphocytes, histiocytes, and large centroblastic appearing lymphoid cells with partially clumped to vesicular chromatin, small nucleoli and amphophilic to clear cytoplasm. The number of admixed large lymphoid cells is variable but they appear relatively abundant in some areas associated with clustering. No material is available for flow cytometric analysis. Hence, a large batter of immunohistochemical stains were performed and show a mixture of T and B cells but with a significant B cell component mostly composed of large lymphoid cells with positivity for CD79a, PAX-5, CD30, BCL-2, and BCL-6. LCA shows diffuse staining although some of the large lymphoid cells appear negative. No significant staining is seen with CD20, CD10, CD15, CD34, TdT, or CD138. The lack of CD20 expression in this material is possibly related to prior treatment. There is variable increased expression of Ki-67 ranging from 10 to >50% in some areas particularly where there is abundance of large lymphoid cells. There is admixed abundant T cell population in the background as seen with CD3, CD5, and CD43 with on apparent co-expression of CD5 in B cell areas. The overall findings are consistent with involvement by non-Hodgkin's  B cell lymphoma and the presence of relative abundance of large lymphoid B-cells and increased Ki-67 expression favor a high grade large B cell lymphoma.   11/23/2017 Surgery   1. CPT 24516-Intramedullary nailing of right humerus fracture 2. CPT 20245-Bone biopsy, deep     11/23/2017 - 11/25/2017 Hospital Admission   She was admitted to the hospital for management of humerus fracture and aggressive lymphoma   11/24/2017 Imaging   ECHO showed LV EF: 65% -   70%   11/25/2017 Procedure   Status post ultrasound-guided biopsy of liver mass. Tissue specimen sent to pathology for complete histopathologic analysis   12/07/2017 Imaging   Successful placement of a right internal  jugular approach power injectable Port-A-Cath. The catheter is ready for immediate use.   12/09/2017 - 03/29/2018 Chemotherapy   The patient had R-CHOP chemo     12/17/2017 - 12/20/2017 Hospital Admission   She was hospitalized for influenza infection   01/03/2018 Procedure   Unsuccessful initial L4-5 LP. Successful L2-3 lumbar puncture with installation of methotrexate.   01/24/2018 Procedure   Non complicated injection of intrathecal methotrexate, as above.   02/09/2018 Imaging   LV EF: 55% -  60%   02/10/2018 PET scan   1. Interval response to therapy. There has been resolution of previous hypermetabolic tumor within the chest and abdomen. Deauville criteria 2 and 3. 2. Diffuse radiotracer uptake is identified within the axial and appendicular skeleton which is favored to represent treatment related changes. 3. Significant decrease in FDG uptake associated with previously noted hypermetabolic tumor involving the proximal humeral diaphysis. The FDG uptake within this area on today's study is equivalent to that which is seen in the lumbar spine. 4. Aortic Atherosclerosis (ICD10-I70.0). Lad coronary artery calcifications. 5. Hiatal hernia   02/15/2018 Procedure   Fluoroscopic guided lumbar puncture and intrathecal injection of chemotherapy without complication.   03/31/2018 Procedure   Technically successful fluoroscopic guided lumbar puncture for intrathecal methotrexate injection. Procedure was well tolerated without evidence for immediate complications. Patient will recover in short stay prior to being discharged home   05/08/2018 PET scan   No residual/persistent enlarged or hypermetabolic lymph nodes in the neck, chest, abdomen or pelvis. Findings suggest an excellent response to treatment.   05/25/2018 -  Chemotherapy   The patient had Revlimid for maintenance chemo    06/07/2018 Imaging   LV EF: 55% -  60%   09/25/2018 PET scan   1. Stable exam. No hypermetabolic lymphadenopathy in  the neck, chest, abdomen, or pelvis. 2.  Aortic Atherosclerois (ICD10-170.0) 3.  Emphysema. (ICD10-J43.9)    04/09/2019 Imaging   1. No CT findings without contrast to suggest recurrent lymphoma involving the chest, abdomen or pelvis. 2. Stable advanced atherosclerotic calcifications involving the thoracic and abdominal aorta and branch vessels. 3. Moderate to large hiatal hernia. 4. No significant bony findings.     REVIEW OF SYSTEMS:   Constitutional: Denies fevers, chills or abnormal weight loss Eyes: Denies blurriness of vision Ears, nose, mouth, throat, and face: Denies mucositis or sore throat Respiratory: Denies cough, dyspnea or wheezes Cardiovascular: Denies palpitation, chest discomfort or lower extremity swelling Gastrointestinal:  Denies nausea, heartburn or change in bowel habits Skin: Denies abnormal skin rashes Lymphatics: Denies new lymphadenopathy or easy bruising Neurological:Denies numbness, tingling or new weaknesses Behavioral/Psych: Mood is stable, no new changes  All other systems were reviewed with the patient and are negative.  I have reviewed the past medical history, past surgical history, social history and family  history with the patient and they are unchanged from previous note.  ALLERGIES:  is allergic to contrast media [iodinated diagnostic agents]; iohexol; povidone-iodine; penicillins; and adhesive [tape].  MEDICATIONS:  Current Outpatient Medications  Medication Sig Dispense Refill  . acetaminophen (TYLENOL) 325 MG tablet Take 1-2 tablets (325-650 mg total) by mouth every 6 (six) hours as needed for mild pain (pain score 1-3 or temp > 100.5).    Marland Kitchen albuterol (PROVENTIL HFA;VENTOLIN HFA) 108 (90 Base) MCG/ACT inhaler Inhale 2 puffs into the lungs every 6 (six) hours as needed for wheezing or shortness of breath. 1 Inhaler 0  . BREO ELLIPTA 100-25 MCG/INH AEPB ONE INHALTION INTO LUNGS EVERY DAY (Patient taking differently: Inhale 1 puff into the  lungs every other day. ) 60 each 6  . cholecalciferol (VITAMIN D) 1000 units tablet Take 2,000 Units by mouth daily.    Marland Kitchen KLOR-CON M20 20 MEQ tablet TAKE 1 TABLET BY MOUTH EVERY DAY 90 tablet 1  . methocarbamol (ROBAXIN) 500 MG tablet Take 1 tablet (500 mg total) by mouth every 6 (six) hours as needed for muscle spasms. 30 tablet 1  . polyethylene glycol (MIRALAX / GLYCOLAX) 17 g packet Take 17 g by mouth daily as needed. 14 each 0  . sertraline (ZOLOFT) 25 MG tablet Take 1 tablet (25 mg total) by mouth daily. 90 tablet 1   No current facility-administered medications for this visit.     PHYSICAL EXAMINATION: ECOG PERFORMANCE STATUS: 1 - Symptomatic but completely ambulatory  Vitals:   08/06/19 0930  BP: (!) 149/79  Pulse: 83  Resp: 18  Temp: 98.5 F (36.9 C)  SpO2: 94%   Filed Weights   08/06/19 0930  Weight: 126 lb 11.2 oz (57.5 kg)    GENERAL:alert, no distress and comfortable SKIN: skin color, texture, turgor are normal, no rashes or significant lesions EYES: normal, Conjunctiva are pink and non-injected, sclera clear OROPHARYNX:no exudate, no erythema and lips, buccal mucosa, and tongue normal  NECK: supple, thyroid normal size, non-tender, without nodularity LYMPH:  no palpable lymphadenopathy in the cervical, axillary or inguinal LUNGS: clear to auscultation and percussion with normal breathing effort HEART: regular rate & rhythm and no murmurs and no lower extremity edema ABDOMEN:abdomen soft, non-tender and normal bowel sounds Musculoskeletal:no cyanosis of digits and no clubbing  NEURO: alert & oriented x 3 with fluent speech, no focal motor/sensory deficits  LABORATORY DATA:  I have reviewed the data as listed    Component Value Date/Time   NA 143 08/06/2019 0915   NA 142 12/28/2016 1002   K 3.6 08/06/2019 0915   K 4.6 12/28/2016 1002   CL 108 08/06/2019 0915   CL 101 03/28/2013 0826   CO2 25 08/06/2019 0915   CO2 29 12/28/2016 1002   GLUCOSE 86  08/06/2019 0915   GLUCOSE 85 12/28/2016 1002   GLUCOSE 84 03/28/2013 0826   BUN 13 08/06/2019 0915   BUN 20.3 12/28/2016 1002   CREATININE 0.71 08/06/2019 0915   CREATININE 0.78 05/03/2019 1019   CREATININE 0.9 12/28/2016 1002   CALCIUM 9.4 08/06/2019 0915   CALCIUM 10.1 12/28/2016 1002   PROT 6.1 (L) 08/06/2019 0915   PROT 6.8 12/28/2016 1002   ALBUMIN 3.8 08/06/2019 0915   ALBUMIN 4.2 12/28/2016 1002   AST 16 08/06/2019 0915   AST 12 (L) 05/03/2019 1019   AST 20 12/28/2016 1002   ALT 9 08/06/2019 0915   ALT 10 05/03/2019 1019   ALT 12 12/28/2016 1002  ALKPHOS 132 (H) 08/06/2019 0915   ALKPHOS 111 12/28/2016 1002   BILITOT 0.4 08/06/2019 0915   BILITOT 0.3 05/03/2019 1019   BILITOT 0.55 12/28/2016 1002   GFRNONAA >60 08/06/2019 0915   GFRNONAA >60 05/03/2019 1019   GFRAA >60 08/06/2019 0915   GFRAA >60 05/03/2019 1019    No results found for: SPEP, UPEP  Lab Results  Component Value Date   WBC 6.8 08/06/2019   NEUTROABS 4.1 08/06/2019   HGB 12.4 08/06/2019   HCT 36.8 08/06/2019   MCV 102.2 (H) 08/06/2019   PLT 241 08/06/2019      Chemistry      Component Value Date/Time   NA 143 08/06/2019 0915   NA 142 12/28/2016 1002   K 3.6 08/06/2019 0915   K 4.6 12/28/2016 1002   CL 108 08/06/2019 0915   CL 101 03/28/2013 0826   CO2 25 08/06/2019 0915   CO2 29 12/28/2016 1002   BUN 13 08/06/2019 0915   BUN 20.3 12/28/2016 1002   CREATININE 0.71 08/06/2019 0915   CREATININE 0.78 05/03/2019 1019   CREATININE 0.9 12/28/2016 1002      Component Value Date/Time   CALCIUM 9.4 08/06/2019 0915   CALCIUM 10.1 12/28/2016 1002   ALKPHOS 132 (H) 08/06/2019 0915   ALKPHOS 111 12/28/2016 1002   AST 16 08/06/2019 0915   AST 12 (L) 05/03/2019 1019   AST 20 12/28/2016 1002   ALT 9 08/06/2019 0915   ALT 10 05/03/2019 1019   ALT 12 12/28/2016 1002   BILITOT 0.4 08/06/2019 0915   BILITOT 0.3 05/03/2019 1019   BILITOT 0.55 12/28/2016 1002      All questions were  answered. The patient knows to call the clinic with any problems, questions or concerns. No barriers to learning was detected.  I spent 15 minutes counseling the patient face to face. The total time spent in the appointment was 20 minutes and more than 50% was on counseling and review of test results  [name], MD 08/06/2019 10:28 AM

## 2019-08-06 NOTE — Assessment & Plan Note (Signed)
I have reviewed the plan of care with the patient and her husband over the telephone The patient have no evidence of cancer recurrence She tolerated Revlimid poorly in the past with mucositis, weight loss and feeling unwell She is fully recovered from side effects of treatment since discontinuation of Revlimid She is still recovering from recent fall/hip fracture We will monitor for signs of relapse closely I will see her again in 8 weeks for further follow-up I plan to repeat CT imaging at the end of the year Due to history of IV contrast allergy, I will order CT scan without IV contrast

## 2019-10-07 ENCOUNTER — Other Ambulatory Visit: Payer: Self-pay | Admitting: Adult Health

## 2019-10-08 ENCOUNTER — Inpatient Hospital Stay: Payer: Medicare Other | Attending: Hematology and Oncology

## 2019-10-08 ENCOUNTER — Other Ambulatory Visit: Payer: Self-pay | Admitting: Hematology and Oncology

## 2019-10-08 ENCOUNTER — Ambulatory Visit (HOSPITAL_COMMUNITY)
Admission: RE | Admit: 2019-10-08 | Discharge: 2019-10-08 | Disposition: A | Discharge status: Home / Self Care | Payer: Medicare Other | Source: Ambulatory Visit | Attending: Hematology and Oncology | Admitting: Hematology and Oncology

## 2019-10-08 ENCOUNTER — Other Ambulatory Visit: Payer: Self-pay

## 2019-10-08 ENCOUNTER — Inpatient Hospital Stay: Payer: Medicare Other

## 2019-10-08 ENCOUNTER — Encounter (HOSPITAL_COMMUNITY): Payer: Self-pay

## 2019-10-08 DIAGNOSIS — E538 Deficiency of other specified B group vitamins: Secondary | ICD-10-CM | POA: Diagnosis not present

## 2019-10-08 DIAGNOSIS — M818 Other osteoporosis without current pathological fracture: Secondary | ICD-10-CM

## 2019-10-08 DIAGNOSIS — Z8572 Personal history of non-Hodgkin lymphomas: Secondary | ICD-10-CM | POA: Insufficient documentation

## 2019-10-08 DIAGNOSIS — E790 Hyperuricemia without signs of inflammatory arthritis and tophaceous disease: Secondary | ICD-10-CM | POA: Diagnosis not present

## 2019-10-08 DIAGNOSIS — T451X5A Adverse effect of antineoplastic and immunosuppressive drugs, initial encounter: Secondary | ICD-10-CM

## 2019-10-08 DIAGNOSIS — G620 Drug-induced polyneuropathy: Secondary | ICD-10-CM | POA: Insufficient documentation

## 2019-10-08 DIAGNOSIS — C8588 Other specified types of non-Hodgkin lymphoma, lymph nodes of multiple sites: Secondary | ICD-10-CM | POA: Diagnosis not present

## 2019-10-08 DIAGNOSIS — Z452 Encounter for adjustment and management of vascular access device: Secondary | ICD-10-CM | POA: Diagnosis not present

## 2019-10-08 LAB — CBC WITH DIFFERENTIAL/PLATELET
Abs Immature Granulocytes: 0.02 10*3/uL (ref 0.00–0.07)
Basophils Absolute: 0.1 10*3/uL (ref 0.0–0.1)
Basophils Relative: 1 %
Eosinophils Absolute: 0.3 10*3/uL (ref 0.0–0.5)
Eosinophils Relative: 6 %
HCT: 40.6 % (ref 36.0–46.0)
Hemoglobin: 13.5 g/dL (ref 12.0–15.0)
Immature Granulocytes: 0 %
Lymphocytes Relative: 21 %
Lymphs Abs: 1.1 10*3/uL (ref 0.7–4.0)
MCH: 34 pg (ref 26.0–34.0)
MCHC: 33.3 g/dL (ref 30.0–36.0)
MCV: 102.3 fL — ABNORMAL HIGH (ref 80.0–100.0)
Monocytes Absolute: 0.6 10*3/uL (ref 0.1–1.0)
Monocytes Relative: 12 %
Neutro Abs: 3.1 10*3/uL (ref 1.7–7.7)
Neutrophils Relative %: 60 %
Platelets: 260 10*3/uL (ref 150–400)
RBC: 3.97 MIL/uL (ref 3.87–5.11)
RDW: 13.1 % (ref 11.5–15.5)
WBC: 5.2 10*3/uL (ref 4.0–10.5)
nRBC: 0 % (ref 0.0–0.2)

## 2019-10-08 LAB — VITAMIN B12: Vitamin B-12: 134 pg/mL — ABNORMAL LOW (ref 180–914)

## 2019-10-08 LAB — COMPREHENSIVE METABOLIC PANEL
ALT: 15 U/L (ref 0–44)
AST: 20 U/L (ref 15–41)
Albumin: 4.2 g/dL (ref 3.5–5.0)
Alkaline Phosphatase: 135 U/L — ABNORMAL HIGH (ref 38–126)
Anion gap: 10 (ref 5–15)
BUN: 12 mg/dL (ref 8–23)
CO2: 28 mmol/L (ref 22–32)
Calcium: 9.3 mg/dL (ref 8.9–10.3)
Chloride: 102 mmol/L (ref 98–111)
Creatinine, Ser: 0.75 mg/dL (ref 0.44–1.00)
GFR calc Af Amer: >60 mL/min (ref >60)
GFR calc non Af Amer: >60 mL/min (ref >60)
Glucose, Bld: 82 mg/dL (ref 70–99)
Potassium: 4 mmol/L (ref 3.5–5.1)
Sodium: 140 mmol/L (ref 135–145)
Total Bilirubin: 0.4 mg/dL (ref 0.3–1.2)
Total Protein: 6.5 g/dL (ref 6.5–8.1)

## 2019-10-08 LAB — URIC ACID: Uric Acid, Serum: 7.4 mg/dL — ABNORMAL HIGH (ref 2.5–7.1)

## 2019-10-08 LAB — LACTATE DEHYDROGENASE: LDH: 304 U/L — ABNORMAL HIGH (ref 98–192)

## 2019-10-08 LAB — VITAMIN D 25 HYDROXY (VIT D DEFICIENCY, FRACTURES): Vit D, 25-Hydroxy: 55.6 ng/mL (ref 30–100)

## 2019-10-08 MED ORDER — HEPARIN SOD (PORK) LOCK FLUSH 100 UNIT/ML IV SOLN
500.0000 [IU] | Freq: Once | INTRAVENOUS | Status: AC
  Administered 2019-10-08: 500 [IU]
  Filled 2019-10-08: qty 5

## 2019-10-08 MED ORDER — SODIUM CHLORIDE 0.9% FLUSH
10.0000 mL | Freq: Once | INTRAVENOUS | Status: AC
  Administered 2019-10-08: 10 mL
  Filled 2019-10-08: qty 10

## 2019-10-08 NOTE — Patient Instructions (Signed)

## 2019-10-09 ENCOUNTER — Encounter: Payer: Self-pay | Admitting: Hematology and Oncology

## 2019-10-09 ENCOUNTER — Inpatient Hospital Stay (HOSPITAL_BASED_OUTPATIENT_CLINIC_OR_DEPARTMENT_OTHER): Payer: Medicare Other | Admitting: Hematology and Oncology

## 2019-10-09 ENCOUNTER — Telehealth: Payer: Self-pay

## 2019-10-09 ENCOUNTER — Telehealth: Payer: Self-pay | Admitting: Hematology and Oncology

## 2019-10-09 ENCOUNTER — Other Ambulatory Visit: Payer: Self-pay

## 2019-10-09 VITALS — BP 119/59 | HR 97 | Temp 98.3°F | Resp 16 | Ht 64.0 in | Wt 127.1 lb

## 2019-10-09 DIAGNOSIS — C8588 Other specified types of non-Hodgkin lymphoma, lymph nodes of multiple sites: Secondary | ICD-10-CM | POA: Diagnosis not present

## 2019-10-09 DIAGNOSIS — T451X5A Adverse effect of antineoplastic and immunosuppressive drugs, initial encounter: Secondary | ICD-10-CM

## 2019-10-09 DIAGNOSIS — G620 Drug-induced polyneuropathy: Secondary | ICD-10-CM | POA: Diagnosis not present

## 2019-10-09 DIAGNOSIS — E538 Deficiency of other specified B group vitamins: Secondary | ICD-10-CM

## 2019-10-09 DIAGNOSIS — Z8572 Personal history of non-Hodgkin lymphomas: Secondary | ICD-10-CM | POA: Diagnosis not present

## 2019-10-09 DIAGNOSIS — T148XXD Other injury of unspecified body region, subsequent encounter: Secondary | ICD-10-CM | POA: Insufficient documentation

## 2019-10-09 DIAGNOSIS — E790 Hyperuricemia without signs of inflammatory arthritis and tophaceous disease: Secondary | ICD-10-CM

## 2019-10-09 MED ORDER — CYANOCOBALAMIN 1000 MCG/ML IJ SOLN
INTRAMUSCULAR | Status: AC
  Filled 2019-10-09: qty 1

## 2019-10-09 MED ORDER — CYANOCOBALAMIN 1000 MCG/ML IJ SOLN
1000.0000 ug | Freq: Once | INTRAMUSCULAR | Status: AC
  Administered 2019-10-09: 11:00:00 1000 ug via INTRAMUSCULAR

## 2019-10-09 NOTE — Telephone Encounter (Signed)
Scheduled appt per 12/29 sch message- unable to reach pt . Left message with appt date and time   

## 2019-10-09 NOTE — Assessment & Plan Note (Signed)
She has multifactorial neuropathy, due to vitamin B12 deficiency and prior chemotherapy I recommend high-dose vitamin B12 replacement and she agreed

## 2019-10-09 NOTE — Telephone Encounter (Signed)
Called and left a message on both numbers that husband can come in for appt today with Dr. Alvy Bimler.

## 2019-10-09 NOTE — Assessment & Plan Note (Signed)
She has recurrent hyperuricemia I suspect she is dehydrated  I recommend increase hydration and dietary changes

## 2019-10-09 NOTE — Progress Notes (Signed)
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[facility]) Clinically, she has no signs of cancer recurrence CT scan is unremarkable She is still at high risk of cancer recurrence I plan to repeat CT imaging in 6 months, due at the end of June 2021 I will continue port maintenance with blood work, physical examination in about 8 weeks  Vitamin B12 deficiency She is noted to have severe vitamin B12 deficiency with manifestation of mild abnormal CBC and neuropathy I recommend high-dose vitamin B12 replacement therapy weekly x4 and then once every other month to coincide at the timing of her port flushes She agreed  Peripheral neuropathy due to chemotherapy [facility]) She has multifactorial neuropathy, due to vitamin B12 deficiency and prior chemotherapy I recommend high-dose vitamin B12 replacement and she agreed  Hyperuricemia She has recurrent hyperuricemia I suspect she is dehydrated  I recommend increase hydration and dietary changes  Delayed wound healing She have signs of delayed wound healing over her surgical site I recommend her to put gauze over it to protect her skin She has appointment to see orthopedic surgery next month   No orders of the defined types were placed in this encounter.   INTERVAL HISTORY: Please see below for problem oriented charting. 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 She was treated with chlorambucil in 2000.   --2007 - 09/2008.  Treated with single agent Rituxan.   --12/2008.  She had progressive adenopathy in chest and abdomen and repeat biopsy showed low grade follicular and diffuse B cell NHL.   --01/2009 - 07/2009. She required left ureteral stent and was treated with Treanda/Rituxan x 5 cycles from 02-2009 thru 06-2009.    09/16/2014 Imaging   No lymphadenopathy in the chest, abdomen, or pelvis. The small right axillary lymph nodes seen on the previous exam are unchanged in the 18 month interval. Small to moderate hiatal hernia.   11/20/2017 Imaging   Mildly displaced and angulated pathologic fracture of the proximal humeral shaft. 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    11/22/2017 PET scan   New hypermetabolic lymphadenopathy within the chest and abdomen, consistent with recurrent lymphoma. New large hypermetabolic mass in the right hepatic lobe, consistent with lymphoma this involvement. New small hypermetabolic mass in the left kidney, which may be due to lymphomatous involvement although primary renal cell carcinoma cannot be excluded. Hypermetabolic mass in the right humerus with pathologic fracture, consistent with lymphomatous involvement. Small hypermetabolic foci in right posterior chest wall, also suspicious for recurrent lymphoma.   11/23/2017 Procedure   Bone, fragment(s), Right Humeral Shaft Reamings - ATYPICAL LYMPHOID INFILTRATE CONSISTENT WITH NON-HODGKIN'S B CELL LYMPHOMA. - SEE ONCOLOGY TABLE. Microscopic Comment LYMPHOMA Histologic type: Non-Hodgkins lymphoma, favor large cell type. Grade (  if applicable): Favor high grade. Flow cytometry: No specimen is available for analysis. Immunohistochemical  stains: BCL-2, BCL-6, CD3, CD5, CD10, CD15, CD20, CD30, CD34, CD43, LCA, CD79a, CD138, Ki-67, PAX-5 and TdT with appropriate controls as performed on block 1C. Touch preps/imprints: Not performed. Comments: The sections show multiple, primarily soft tissue fragments displaying a dense lymphoid infiltrate characterized by a mixture of small round to slightly irregular lymphocytes, histiocytes, and large centroblastic appearing lymphoid cells with partially clumped to vesicular chromatin, small nucleoli and amphophilic to clear cytoplasm. The number of admixed large lymphoid cells is variable but they appear relatively abundant in some areas associated with clustering. No material is available for flow cytometric analysis. Hence, a large batter of immunohistochemical stains were performed and show a mixture of T and B cells but with a significant B cell component mostly composed of large lymphoid cells with positivity for CD79a, PAX-5, CD30, BCL-2, and BCL-6. LCA shows diffuse staining although some of the large lymphoid cells appear negative. No significant staining is seen with CD20, CD10, CD15, CD34, TdT, or CD138. The lack of CD20 expression in this material is possibly related to prior treatment. There is variable increased expression of Ki-67 ranging from 10 to >50% in some areas particularly where there is abundance of large lymphoid cells. There is admixed abundant T cell population in the background as seen with CD3, CD5, and CD43 with on apparent co-expression of CD5 in B cell areas. The overall findings are consistent with involvement by non-Hodgkin's B cell lymphoma and the presence of relative abundance of large lymphoid B-cells and increased Ki-67 expression favor a high grade large B cell lymphoma.   11/23/2017 Surgery   1. CPT 24516-Intramedullary nailing of right humerus fracture 2. CPT 20245-Bone biopsy, deep     11/23/2017 - 11/25/2017 Hospital Admission   She was admitted to the hospital  for management of humerus fracture and aggressive lymphoma   11/24/2017 Imaging   ECHO showed LV EF: 65% -   70%   11/25/2017 Procedure   Status post ultrasound-guided biopsy of liver mass. Tissue specimen sent to pathology for complete histopathologic analysis   12/07/2017 Imaging   Successful placement of a right internal jugular approach power injectable Port-A-Cath. The catheter is ready for immediate use.   12/09/2017 - 03/29/2018 Chemotherapy   The patient had R-CHOP chemo     12/17/2017 - 12/20/2017 Hospital Admission   She was hospitalized for influenza infection   01/03/2018 Procedure   Unsuccessful initial L4-5 LP. Successful L2-3 lumbar puncture with installation of methotrexate.   01/24/2018 Procedure   Non complicated injection of intrathecal methotrexate, as above.   02/09/2018 Imaging   LV EF: 55% -  60%   02/10/2018 PET scan   1. Interval response to therapy. There has been resolution of previous hypermetabolic tumor within the chest and abdomen. Deauville criteria 2 and 3. 2. Diffuse radiotracer uptake is identified within the axial and appendicular skeleton which is favored to represent treatment related changes. 3. Significant decrease in FDG uptake associated with previously noted hypermetabolic tumor involving the proximal humeral diaphysis. The FDG uptake within this area on today's study is equivalent to that which is seen in the lumbar spine. 4. Aortic Atherosclerosis (ICD10-I70.0). Lad coronary artery calcifications. 5. Hiatal hernia   02/15/2018 Procedure   Fluoroscopic guided lumbar puncture and intrathecal injection of chemotherapy without complication.   03/31/2018 Procedure   Technically successful fluoroscopic guided lumbar puncture for intrathecal methotrexate injection. Procedure was well tolerated without evidence  for immediate complications. Patient will recover in short stay prior to being discharged home   05/08/2018 PET scan   No residual/persistent  enlarged or hypermetabolic lymph nodes in the neck, chest, abdomen or pelvis. Findings suggest an excellent response to treatment.   05/25/2018 -  Chemotherapy   The patient had Revlimid for maintenance chemo    06/07/2018 Imaging   LV EF: 55% -  60%   09/25/2018 PET scan   1. Stable exam. No hypermetabolic lymphadenopathy in the neck, chest, abdomen, or pelvis. 2.  Aortic Atherosclerois (ICD10-170.0) 3.  Emphysema. (ICD10-J43.9)    04/09/2019 Imaging   1. No CT findings without contrast to suggest recurrent lymphoma involving the chest, abdomen or pelvis. 2. Stable advanced atherosclerotic calcifications involving the thoracic and abdominal aorta and branch vessels. 3. Moderate to large hiatal hernia. 4. No significant bony findings.   10/08/2019 Imaging   1. No CT findings to suggest recurrent lymphoma. No lymphadenopathy in the lower neck, chest, abdomen, pelvis or inguinal regions. 2. Stable advanced atherosclerotic calcifications involving the thoracic and abdominal aorta and branch vessels. 3. Moderate to large hiatal hernia. 4. Stable sclerotic bone lesions involving the right ischium and right hip. New left total hip arthroplasty noted.       REVIEW OF SYSTEMS:   Constitutional: Denies fevers, chills or abnormal weight loss Eyes: Denies blurriness of vision Ears, nose, mouth, throat, and face: Denies mucositis or sore throat Respiratory: Denies cough, dyspnea or wheezes Cardiovascular: Denies palpitation, chest discomfort or lower extremity swelling Gastrointestinal:  Denies nausea, heartburn or change in bowel habits Lymphatics: Denies new lymphadenopathy or easy bruising Behavioral/Psych: Mood is stable, no new changes  All other systems were reviewed with the patient and are negative.  I have reviewed the past medical history, past surgical history, social history and family history with the patient and they are unchanged from previous note.  ALLERGIES:  is  allergic to contrast media [iodinated diagnostic agents]; iohexol; povidone-iodine; penicillins; and adhesive [tape].  MEDICATIONS:  Current Outpatient Medications  Medication Sig Dispense Refill  . acetaminophen (TYLENOL) 325 MG tablet Take 1-2 tablets (325-650 mg total) by mouth every 6 (six) hours as needed for mild pain (pain score 1-3 or temp > 100.5).    Marland Kitchen albuterol (PROVENTIL HFA;VENTOLIN HFA) 108 (90 Base) MCG/ACT inhaler Inhale 2 puffs into the lungs every 6 (six) hours as needed for wheezing or shortness of breath. 1 Inhaler 0  . BREO ELLIPTA 100-25 MCG/INH AEPB ONE INHALTION INTO LUNGS EVERY DAY (Patient taking differently: Inhale 1 puff into the lungs every other day. ) 60 each 6  . cholecalciferol (VITAMIN D) 1000 units tablet Take 2,000 Units by mouth daily.    . polyethylene glycol (MIRALAX / GLYCOLAX) 17 g packet Take 17 g by mouth daily as needed. 14 each 0  . sertraline (ZOLOFT) 25 MG tablet Take 1 tablet (25 mg total) by mouth daily. 90 tablet 1   No current facility-administered medications for this visit.    PHYSICAL EXAMINATION: ECOG PERFORMANCE STATUS: 1 - Symptomatic but completely ambulatory  Vitals:   10/09/19 0953  BP: (!) 119/59  Pulse: 97  Resp: 16  Temp: 98.3 F (36.8 C)  SpO2: 93%   Filed Weights   10/09/19 0953  Weight: 127 lb 1.6 oz (57.7 kg)    GENERAL:alert, no distress and comfortable SKIN: The incision site over her left hip has not completely healed EYES: normal, Conjunctiva are pink and non-injected, sclera clear OROPHARYNX:no exudate, no erythema  and lips, buccal mucosa, and tongue normal  NECK: supple, thyroid normal size, non-tender, without nodularity LYMPH:  no palpable lymphadenopathy in the cervical, axillary or inguinal LUNGS: clear to auscultation and percussion with normal breathing effort HEART: regular rate & rhythm and no murmurs and no lower extremity edema ABDOMEN:abdomen soft, non-tender and normal bowel  sounds Musculoskeletal:no cyanosis of digits and no clubbing  NEURO: alert & oriented x 3 with fluent speech, no focal motor/sensory deficits  LABORATORY DATA:  I have reviewed the data as listed    Component Value Date/Time   NA 140 10/08/2019 0950   NA 142 12/28/2016 1002   K 4.0 10/08/2019 0950   K 4.6 12/28/2016 1002   CL 102 10/08/2019 0950   CL 101 03/28/2013 0826   CO2 28 10/08/2019 0950   CO2 29 12/28/2016 1002   GLUCOSE 82 10/08/2019 0950   GLUCOSE 85 12/28/2016 1002   GLUCOSE 84 03/28/2013 0826   BUN 12 10/08/2019 0950   BUN 20.3 12/28/2016 1002   CREATININE 0.75 10/08/2019 0950   CREATININE 0.78 05/03/2019 1019   CREATININE 0.9 12/28/2016 1002   CALCIUM 9.3 10/08/2019 0950   CALCIUM 10.1 12/28/2016 1002   PROT 6.5 10/08/2019 0950   PROT 6.8 12/28/2016 1002   ALBUMIN 4.2 10/08/2019 0950   ALBUMIN 4.2 12/28/2016 1002   AST 20 10/08/2019 0950   AST 12 (L) 05/03/2019 1019   AST 20 12/28/2016 1002   ALT 15 10/08/2019 0950   ALT 10 05/03/2019 1019   ALT 12 12/28/2016 1002   ALKPHOS 135 (H) 10/08/2019 0950   ALKPHOS 111 12/28/2016 1002   BILITOT 0.4 10/08/2019 0950   BILITOT 0.3 05/03/2019 1019   BILITOT 0.55 12/28/2016 1002   GFRNONAA >60 10/08/2019 0950   GFRNONAA >60 05/03/2019 1019   GFRAA >60 10/08/2019 0950   GFRAA >60 05/03/2019 1019    No results found for: SPEP, UPEP  Lab Results  Component Value Date   WBC 5.2 10/08/2019   NEUTROABS 3.1 10/08/2019   HGB 13.5 10/08/2019   HCT 40.6 10/08/2019   MCV 102.3 (H) 10/08/2019   PLT 260 10/08/2019      Chemistry      Component Value Date/Time   NA 140 10/08/2019 0950   NA 142 12/28/2016 1002   K 4.0 10/08/2019 0950   K 4.6 12/28/2016 1002   CL 102 10/08/2019 0950   CL 101 03/28/2013 0826   CO2 28 10/08/2019 0950   CO2 29 12/28/2016 1002   BUN 12 10/08/2019 0950   BUN 20.3 12/28/2016 1002   CREATININE 0.75 10/08/2019 0950   CREATININE 0.78 05/03/2019 1019   CREATININE 0.9 12/28/2016 1002       Component Value Date/Time   CALCIUM 9.3 10/08/2019 0950   CALCIUM 10.1 12/28/2016 1002   ALKPHOS 135 (H) 10/08/2019 0950   ALKPHOS 111 12/28/2016 1002   AST 20 10/08/2019 0950   AST 12 (L) 05/03/2019 1019   AST 20 12/28/2016 1002   ALT 15 10/08/2019 0950   ALT 10 05/03/2019 1019   ALT 12 12/28/2016 1002   BILITOT 0.4 10/08/2019 0950   BILITOT 0.3 05/03/2019 1019   BILITOT 0.55 12/28/2016 1002       RADIOGRAPHIC STUDIES: I have personally reviewed the radiological images as listed and agreed with the findings in the report. CT Abdomen Pelvis Wo Contrast  Result Date: 10/08/2019 CLINICAL DATA:  Restaging recurrent lymphoma. EXAM: CT CHEST, ABDOMEN AND PELVIS WITHOUT CONTRAST TECHNIQUE: Multidetector CT imaging  of the chest, abdomen and pelvis was performed following the standard protocol without IV contrast. COMPARISON:  04/09/2019 FINDINGS: CT CHEST FINDINGS Cardiovascular: The heart is normal in size. No pericardial effusion. Stable tortuosity and calcification of the thoracic aorta. Stable coronary artery calcifications. The right-sided Port-A-Cath is stable. Mediastinum/Nodes: No mediastinal or hilar mass or adenopathy. Stable moderate to large hiatal hernia. Lungs/Pleura: Stable emphysematous changes and pulmonary scarring. No worrisome pulmonary lesions or pulmonary nodules. No infiltrates, edema or effusions. Musculoskeletal: No breast masses, supraclavicular or axillary lymphadenopathy. The bony structures are unremarkable. No lytic bone lesions. Stable mild osteoporosis. CT ABDOMEN PELVIS FINDINGS Hepatobiliary: No focal hepatic lesions are identified without contrast. No intrahepatic biliary dilatation. The gallbladder appears normal. No common bile duct dilatation. Pancreas: No mass, inflammation or ductal dilatation. Spleen: Normal size.  No focal lesions. Adrenals/Urinary Tract: The adrenal glands are normal in stable. No renal lesions or hydronephrosis. The bladder is grossly  normal. Stomach/Bowel: The stomach, duodenum, small bowel and colon are unremarkable. No acute inflammatory changes, mass lesions or obstructive findings. Vascular/Lymphatic: Stable advanced atherosclerotic calcifications involving the aorta iliac arteries but no aneurysm. No mesenteric or retroperitoneal mass or adenopathy. Small scattered lymph nodes are stable. No pelvic adenopathy or inguinal adenopathy. Reproductive: The uterus and ovaries are unremarkable. Calcified vessels are noted. Other: No pelvic mass or adenopathy. No free pelvic fluid collections. No inguinal mass or adenopathy. No abdominal wall hernia or subcutaneous lesions. Musculoskeletal: Moderate artifact related to a left total hip arthroplasty. Remote healed right-sided pubic rami fractures are noted. There are stable sclerotic lesions involving the ischial tuberosity and the right hip, likely old bone infarcts. No new worrisome bone lesions. IMPRESSION: 1. No CT findings to suggest recurrent lymphoma. No lymphadenopathy in the lower neck, chest, abdomen, pelvis or inguinal regions. 2. Stable advanced atherosclerotic calcifications involving the thoracic and abdominal aorta and branch vessels. 3. Moderate to large hiatal hernia. 4. Stable sclerotic bone lesions involving the right ischium and right hip. New left total hip arthroplasty noted. Electronically Signed   By: Marijo Sanes M.D.   On: 10/08/2019 11:53   CT Chest Wo Contrast  Result Date: 10/08/2019 CLINICAL DATA:  Restaging recurrent lymphoma. EXAM: CT CHEST, ABDOMEN AND PELVIS WITHOUT CONTRAST TECHNIQUE: Multidetector CT imaging of the chest, abdomen and pelvis was performed following the standard protocol without IV contrast. COMPARISON:  04/09/2019 FINDINGS: CT CHEST FINDINGS Cardiovascular: The heart is normal in size. No pericardial effusion. Stable tortuosity and calcification of the thoracic aorta. Stable coronary artery calcifications. The right-sided Port-A-Cath is  stable. Mediastinum/Nodes: No mediastinal or hilar mass or adenopathy. Stable moderate to large hiatal hernia. Lungs/Pleura: Stable emphysematous changes and pulmonary scarring. No worrisome pulmonary lesions or pulmonary nodules. No infiltrates, edema or effusions. Musculoskeletal: No breast masses, supraclavicular or axillary lymphadenopathy. The bony structures are unremarkable. No lytic bone lesions. Stable mild osteoporosis. CT ABDOMEN PELVIS FINDINGS Hepatobiliary: No focal hepatic lesions are identified without contrast. No intrahepatic biliary dilatation. The gallbladder appears normal. No common bile duct dilatation. Pancreas: No mass, inflammation or ductal dilatation. Spleen: Normal size.  No focal lesions. Adrenals/Urinary Tract: The adrenal glands are normal in stable. No renal lesions or hydronephrosis. The bladder is grossly normal. Stomach/Bowel: The stomach, duodenum, small bowel and colon are unremarkable. No acute inflammatory changes, mass lesions or obstructive findings. Vascular/Lymphatic: Stable advanced atherosclerotic calcifications involving the aorta iliac arteries but no aneurysm. No mesenteric or retroperitoneal mass or adenopathy. Small scattered lymph nodes are stable. No pelvic adenopathy or inguinal  adenopathy. Reproductive: The uterus and ovaries are unremarkable. Calcified vessels are noted. Other: No pelvic mass or adenopathy. No free pelvic fluid collections. No inguinal mass or adenopathy. No abdominal wall hernia or subcutaneous lesions. Musculoskeletal: Moderate artifact related to a left total hip arthroplasty. Remote healed right-sided pubic rami fractures are noted. There are stable sclerotic lesions involving the ischial tuberosity and the right hip, likely old bone infarcts. No new worrisome bone lesions. IMPRESSION: 1. No CT findings to suggest recurrent lymphoma. No lymphadenopathy in the lower neck, chest, abdomen, pelvis or inguinal regions. 2. Stable advanced  atherosclerotic calcifications involving the thoracic and abdominal aorta and branch vessels. 3. Moderate to large hiatal hernia. 4. Stable sclerotic bone lesions involving the right ischium and right hip. New left total hip arthroplasty noted. Electronically Signed   By: Marijo Sanes M.D.   On: 10/08/2019 11:53    All questions were answered. The patient knows to call the clinic with any problems, questions or concerns. No barriers to learning was detected.  I spent 25 minutes counseling the patient face to face. The total time spent in the appointment was 30 minutes and more than 50% was on counseling and review of test results  [name], MD 10/09/2019 11:10 AM

## 2019-10-09 NOTE — Assessment & Plan Note (Signed)
Clinically, she has no signs of cancer recurrence CT scan is unremarkable She is still at high risk of cancer recurrence I plan to repeat CT imaging in 6 months, due at the end of June 2021 I will continue port maintenance with blood work, physical examination in about 8 weeks

## 2019-10-09 NOTE — Assessment & Plan Note (Signed)
She is noted to have severe vitamin B12 deficiency with manifestation of mild abnormal CBC and neuropathy I recommend high-dose vitamin B12 replacement therapy weekly x4 and then once every other month to coincide at the timing of her port flushes She agreed

## 2019-10-09 NOTE — Assessment & Plan Note (Signed)
She have signs of delayed wound healing over her surgical site I recommend her to put gauze over it to protect her skin She has appointment to see orthopedic surgery next month

## 2019-10-16 ENCOUNTER — Inpatient Hospital Stay: Payer: Medicare Other | Attending: Hematology and Oncology

## 2019-10-16 ENCOUNTER — Other Ambulatory Visit: Payer: Self-pay

## 2019-10-16 VITALS — BP 130/60 | HR 83 | Temp 98.3°F | Resp 20

## 2019-10-16 DIAGNOSIS — E538 Deficiency of other specified B group vitamins: Secondary | ICD-10-CM | POA: Diagnosis not present

## 2019-10-16 DIAGNOSIS — C8588 Other specified types of non-Hodgkin lymphoma, lymph nodes of multiple sites: Secondary | ICD-10-CM

## 2019-10-16 MED ORDER — CYANOCOBALAMIN 1000 MCG/ML IJ SOLN
1000.0000 ug | Freq: Once | INTRAMUSCULAR | Status: AC
  Administered 2019-10-16: 12:00:00 1000 ug via INTRAMUSCULAR
  Filled 2019-10-16: qty 1

## 2019-10-16 NOTE — Patient Instructions (Signed)
Cyanocobalamin, Pyridoxine, and Folate What is this medicine? A multivitamin containing folic acid, vitamin B6, and vitamin B12. This medicine may be used for other purposes; ask your health care provider or pharmacist if you have questions. COMMON BRAND NAME(S): AllanFol RX, AllanTex, Av-Vite FB, B Complex with Folic Acid, ComBgen, FaBB, Folamin, Folastin, Folbalin, Folbee, Folbic, Folcaps, Folgard, Folgard RX, Folgard RX 2.2, Folplex, Folplex 2.2, Foltabs 800, Foltx, Homocysteine Formula, Niva-Fol, NuFol, TL Gard RX, Virt-Gard, Virt-Vite, Virt-Vite Forte, Vita-Respa What should I tell my health care provider before I take this medicine? They need to know if you have any of these conditions:  bleeding or clotting disorder  history of anemia of any type  other chronic health condition  an unusual or allergic reaction to vitamins, other medicines, foods, dyes, or preservatives  pregnant or trying to get pregnant  breast-feeding How should I use this medicine? Take by mouth with a glass of water. May take with food. Follow the directions on the prescription label. It is usually given once a day. Do not take your medicine more often than directed. Contact your pediatrician regarding the use of this medicine in children. Special care may be needed. Overdosage: If you think you have taken too much of this medicine contact a poison control center or emergency room at once. NOTE: This medicine is only for you. Do not share this medicine with others. What if I miss a dose? If you miss a dose, take it as soon as you can. If it is almost time for your next dose, take only that dose. Do not take double or extra doses. What may interact with this medicine?  levodopa This list may not describe all possible interactions. Give your health care provider a list of all the medicines, herbs, non-prescription drugs, or dietary supplements you use. Also tell them if you smoke, drink alcohol, or use illegal  drugs. Some items may interact with your medicine. What should I watch for while using this medicine? See your health care professional for regular checks on your progress. Remember that vitamin supplements do not replace the need for good nutrition from a balanced diet. What side effects may I notice from receiving this medicine? Side effects that you should report to your doctor or health care professional as soon as possible:  allergic reaction such as skin rash or difficulty breathing  vomiting Side effects that usually do not require medical attention (report to your doctor or health care professional if they continue or are bothersome):  nausea  stomach upset This list may not describe all possible side effects. Call your doctor for medical advice about side effects. You may report side effects to FDA at 1-800-FDA-1088. Where should I keep my medicine? Keep out of the reach of children. Most vitamins should be stored at controlled room temperature. Check your specific product directions. Protect from heat and moisture. Throw away any unused medicine after the expiration date. NOTE: This sheet is a summary. It may not cover all possible information. If you have questions about this medicine, talk to your doctor, pharmacist, or health care provider.  2020 Elsevier/Gold Standard (2007-11-18 00:59:55)  

## 2019-10-23 ENCOUNTER — Inpatient Hospital Stay: Payer: Medicare Other

## 2019-10-23 ENCOUNTER — Other Ambulatory Visit: Payer: Self-pay

## 2019-10-23 VITALS — BP 145/68 | HR 76 | Temp 98.3°F | Resp 18

## 2019-10-23 DIAGNOSIS — C8588 Other specified types of non-Hodgkin lymphoma, lymph nodes of multiple sites: Secondary | ICD-10-CM

## 2019-10-23 DIAGNOSIS — E538 Deficiency of other specified B group vitamins: Secondary | ICD-10-CM | POA: Diagnosis not present

## 2019-10-23 MED ORDER — CYANOCOBALAMIN 1000 MCG/ML IJ SOLN
1000.0000 ug | Freq: Once | INTRAMUSCULAR | Status: AC
  Administered 2019-10-23: 1000 ug via INTRAMUSCULAR
  Filled 2019-10-23: qty 1

## 2019-10-23 NOTE — Patient Instructions (Signed)

## 2019-10-30 ENCOUNTER — Inpatient Hospital Stay: Payer: Medicare Other

## 2019-10-30 ENCOUNTER — Other Ambulatory Visit: Payer: Self-pay

## 2019-10-30 VITALS — BP 133/63 | HR 80 | Temp 98.2°F | Resp 18

## 2019-10-30 DIAGNOSIS — E538 Deficiency of other specified B group vitamins: Secondary | ICD-10-CM | POA: Diagnosis not present

## 2019-10-30 DIAGNOSIS — C8588 Other specified types of non-Hodgkin lymphoma, lymph nodes of multiple sites: Secondary | ICD-10-CM

## 2019-10-30 MED ORDER — CYANOCOBALAMIN 1000 MCG/ML IJ SOLN
INTRAMUSCULAR | Status: AC
  Filled 2019-10-30: qty 1

## 2019-10-30 MED ORDER — CYANOCOBALAMIN 1000 MCG/ML IJ SOLN
1000.0000 ug | Freq: Once | INTRAMUSCULAR | Status: AC
  Administered 2019-10-30: 1000 ug via INTRAMUSCULAR

## 2019-10-30 NOTE — Patient Instructions (Signed)

## 2019-11-01 DIAGNOSIS — H5203 Hypermetropia, bilateral: Secondary | ICD-10-CM | POA: Diagnosis not present

## 2019-11-01 DIAGNOSIS — H04123 Dry eye syndrome of bilateral lacrimal glands: Secondary | ICD-10-CM | POA: Diagnosis not present

## 2019-11-01 DIAGNOSIS — Z961 Presence of intraocular lens: Secondary | ICD-10-CM | POA: Diagnosis not present

## 2019-11-05 ENCOUNTER — Other Ambulatory Visit: Payer: Self-pay | Admitting: Adult Health

## 2019-12-04 ENCOUNTER — Other Ambulatory Visit: Payer: Self-pay

## 2019-12-04 ENCOUNTER — Inpatient Hospital Stay: Payer: Medicare Other

## 2019-12-04 ENCOUNTER — Inpatient Hospital Stay: Payer: Medicare Other | Attending: Hematology and Oncology

## 2019-12-04 ENCOUNTER — Encounter: Payer: Self-pay | Admitting: Hematology and Oncology

## 2019-12-04 ENCOUNTER — Inpatient Hospital Stay (HOSPITAL_BASED_OUTPATIENT_CLINIC_OR_DEPARTMENT_OTHER): Payer: Medicare Other | Admitting: Hematology and Oncology

## 2019-12-04 VITALS — BP 130/67 | HR 73 | Temp 98.3°F | Resp 18

## 2019-12-04 DIAGNOSIS — C8588 Other specified types of non-Hodgkin lymphoma, lymph nodes of multiple sites: Secondary | ICD-10-CM | POA: Diagnosis not present

## 2019-12-04 DIAGNOSIS — E538 Deficiency of other specified B group vitamins: Secondary | ICD-10-CM | POA: Diagnosis not present

## 2019-12-04 DIAGNOSIS — C8338 Diffuse large B-cell lymphoma, lymph nodes of multiple sites: Secondary | ICD-10-CM

## 2019-12-04 DIAGNOSIS — Z8572 Personal history of non-Hodgkin lymphomas: Secondary | ICD-10-CM | POA: Diagnosis not present

## 2019-12-04 LAB — CBC WITH DIFFERENTIAL/PLATELET
Abs Immature Granulocytes: 0.04 10*3/uL (ref 0.00–0.07)
Basophils Absolute: 0.1 10*3/uL (ref 0.0–0.1)
Basophils Relative: 1 %
Eosinophils Absolute: 0.5 10*3/uL (ref 0.0–0.5)
Eosinophils Relative: 7 %
HCT: 39.2 % (ref 36.0–46.0)
Hemoglobin: 13.1 g/dL (ref 12.0–15.0)
Immature Granulocytes: 1 %
Lymphocytes Relative: 20 %
Lymphs Abs: 1.4 10*3/uL (ref 0.7–4.0)
MCH: 34.6 pg — ABNORMAL HIGH (ref 26.0–34.0)
MCHC: 33.4 g/dL (ref 30.0–36.0)
MCV: 103.4 fL — ABNORMAL HIGH (ref 80.0–100.0)
Monocytes Absolute: 0.7 10*3/uL (ref 0.1–1.0)
Monocytes Relative: 10 %
Neutro Abs: 4.4 10*3/uL (ref 1.7–7.7)
Neutrophils Relative %: 61 %
Platelets: 251 10*3/uL (ref 150–400)
RBC: 3.79 MIL/uL — ABNORMAL LOW (ref 3.87–5.11)
RDW: 13.2 % (ref 11.5–15.5)
WBC: 7.1 10*3/uL (ref 4.0–10.5)
nRBC: 0 % (ref 0.0–0.2)

## 2019-12-04 LAB — COMPREHENSIVE METABOLIC PANEL
ALT: 11 U/L (ref 0–44)
AST: 16 U/L (ref 15–41)
Albumin: 3.9 g/dL (ref 3.5–5.0)
Alkaline Phosphatase: 115 U/L (ref 38–126)
Anion gap: 7 (ref 5–15)
BUN: 16 mg/dL (ref 8–23)
CO2: 28 mmol/L (ref 22–32)
Calcium: 9.2 mg/dL (ref 8.9–10.3)
Chloride: 107 mmol/L (ref 98–111)
Creatinine, Ser: 0.73 mg/dL (ref 0.44–1.00)
GFR calc Af Amer: >60 mL/min (ref >60)
GFR calc non Af Amer: >60 mL/min (ref >60)
Glucose, Bld: 85 mg/dL (ref 70–99)
Potassium: 4.1 mmol/L (ref 3.5–5.1)
Sodium: 142 mmol/L (ref 135–145)
Total Bilirubin: 0.4 mg/dL (ref 0.3–1.2)
Total Protein: 6.2 g/dL — ABNORMAL LOW (ref 6.5–8.1)

## 2019-12-04 MED ORDER — CYANOCOBALAMIN 1000 MCG/ML IJ SOLN
1000.0000 ug | Freq: Once | INTRAMUSCULAR | Status: AC
  Administered 2019-12-04: 1000 ug via INTRAMUSCULAR

## 2019-12-04 MED ORDER — SODIUM CHLORIDE 0.9% FLUSH
10.0000 mL | Freq: Once | INTRAVENOUS | Status: AC
  Administered 2019-12-04: 10 mL
  Filled 2019-12-04: qty 10

## 2019-12-04 MED ORDER — HEPARIN SOD (PORK) LOCK FLUSH 100 UNIT/ML IV SOLN
500.0000 [IU] | Freq: Once | INTRAVENOUS | Status: AC
  Administered 2019-12-04: 11:00:00 500 [IU]
  Filled 2019-12-04: qty 5

## 2019-12-04 MED ORDER — CYANOCOBALAMIN 1000 MCG/ML IJ SOLN
INTRAMUSCULAR | Status: AC
  Filled 2019-12-04: qty 1

## 2019-12-04 NOTE — Assessment & Plan Note (Signed)
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

## 2019-12-04 NOTE — Assessment & Plan Note (Signed)
Clinically, she has no signs of cancer recurrence CT scan is unremarkable She is still at high risk of cancer recurrence I plan to repeat CT imaging in a few months months, due at the end of June 2021 I will continue port maintenance with blood work, physical examination in about 8 weeks

## 2019-12-04 NOTE — Progress Notes (Signed)
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) Clinically, she has no signs of cancer recurrence CT scan is unremarkable She is still at high risk of cancer recurrence I plan to repeat CT imaging in a few months months, due at the end of June 2021 I will continue port maintenance with blood work, physical examination in about 8 weeks  Vitamin B12 deficiency She is noted to have severe vitamin B12 deficiency with manifestation of mild abnormal CBC and neuropathy With the B12 injections, she is doing well I will continue B12 injection every other month and plan to recheck a B12 level in June   No orders of the defined types were placed in this encounter.   All questions were answered. The patient knows to call the clinic with any problems, questions or concerns. The total time spent in the appointment was 15 minutes encounter with patients including review of chart and various tests results, discussions about plan of care and coordination of care plan   [name], MD 12/04/2019 11:20 AM  INTERVAL HISTORY: Please see below for problem oriented charting. 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 She was treated with chlorambucil in 2000.   --2007 - 09/2008.   Treated with single agent Rituxan.   --12/2008.  She had progressive adenopathy in chest and abdomen and repeat biopsy showed low grade follicular and diffuse B cell NHL.   --01/2009 - 07/2009. She required left ureteral stent and was treated with Treanda/Rituxan x 5 cycles from 02-2009 thru 06-2009.    09/16/2014 Imaging   No lymphadenopathy in the chest, abdomen, or pelvis. The small right axillary lymph nodes seen on the previous exam are unchanged in the 18 month interval. Small to moderate hiatal hernia.   11/20/2017 Imaging   Mildly displaced and angulated pathologic fracture of the proximal humeral shaft. 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    11/22/2017 PET scan   New hypermetabolic lymphadenopathy within the chest and abdomen, consistent with recurrent lymphoma. New large hypermetabolic mass in the right hepatic lobe, consistent with lymphoma this involvement. New small hypermetabolic mass in the left kidney, which may be due to lymphomatous involvement although primary renal cell carcinoma cannot be excluded. Hypermetabolic mass in the right humerus with pathologic fracture, consistent with lymphomatous involvement. Small hypermetabolic foci in right posterior chest wall, also suspicious for recurrent lymphoma.   11/23/2017 Procedure   Bone, fragment(s), Right Humeral Shaft Reamings - ATYPICAL LYMPHOID INFILTRATE CONSISTENT WITH NON-HODGKIN'S B CELL LYMPHOMA. - SEE ONCOLOGY TABLE. Microscopic Comment LYMPHOMA Histologic type: Non-Hodgkins lymphoma, favor large cell type. Grade (if applicable): Favor high grade. Flow cytometry: No specimen is available for analysis. Immunohistochemical stains: BCL-2, BCL-6, CD3, CD5, CD10, CD15, CD20, CD30, CD34, CD43, LCA, CD79a, CD138, Ki-67, PAX-5 and TdT with appropriate  controls as performed on block 1C. Touch preps/imprints: Not performed. Comments: The sections show multiple, primarily soft tissue  fragments displaying a dense lymphoid infiltrate characterized by a mixture of small round to slightly irregular lymphocytes, histiocytes, and large centroblastic appearing lymphoid cells with partially clumped to vesicular chromatin, small nucleoli and amphophilic to clear cytoplasm. The number of admixed large lymphoid cells is variable but they appear relatively abundant in some areas associated with clustering. No material is available for flow cytometric analysis. Hence, a large batter of immunohistochemical stains were performed and show a mixture of T and B cells but with a significant B cell component mostly composed of large lymphoid cells with positivity for CD79a, PAX-5, CD30, BCL-2, and BCL-6. LCA shows diffuse staining although some of the large lymphoid cells appear negative. No significant staining is seen with CD20, CD10, CD15, CD34, TdT, or CD138. The lack of CD20 expression in this material is possibly related to prior treatment. There is variable increased expression of Ki-67 ranging from 10 to >50% in some areas particularly where there is abundance of large lymphoid cells. There is admixed abundant T cell population in the background as seen with CD3, CD5, and CD43 with on apparent co-expression of CD5 in B cell areas. The overall findings are consistent with involvement by non-Hodgkin's B cell lymphoma and the presence of relative abundance of large lymphoid B-cells and increased Ki-67 expression favor a high grade large B cell lymphoma.   11/23/2017 Surgery   1. CPT 24516-Intramedullary nailing of right humerus fracture 2. CPT 20245-Bone biopsy, deep     11/23/2017 - 11/25/2017 Hospital Admission   She was admitted to the hospital for management of humerus fracture and aggressive lymphoma   11/24/2017 Imaging   ECHO showed LV EF: 65% -   70%   11/25/2017 Procedure   Status post ultrasound-guided biopsy of liver mass. Tissue specimen sent to pathology for complete histopathologic  analysis   12/07/2017 Imaging   Successful placement of a right internal jugular approach power injectable Port-A-Cath. The catheter is ready for immediate use.   12/09/2017 - 03/29/2018 Chemotherapy   The patient had R-CHOP chemo     12/17/2017 - 12/20/2017 Hospital Admission   She was hospitalized for influenza infection   01/03/2018 Procedure   Unsuccessful initial L4-5 LP. Successful L2-3 lumbar puncture with installation of methotrexate.   01/24/2018 Procedure   Non complicated injection of intrathecal methotrexate, as above.   02/09/2018 Imaging   LV EF: 55% -  60%   02/10/2018 PET scan   1. Interval response to therapy. There has been resolution of previous hypermetabolic tumor within the chest and abdomen. Deauville criteria 2 and 3. 2. Diffuse radiotracer uptake is identified within the axial and appendicular skeleton which is favored to represent treatment related changes. 3. Significant decrease in FDG uptake associated with previously noted hypermetabolic tumor involving the proximal humeral diaphysis. The FDG uptake within this area on today's study is equivalent to that which is seen in the lumbar spine. 4. Aortic Atherosclerosis (ICD10-I70.0). Lad coronary artery calcifications. 5. Hiatal hernia   02/15/2018 Procedure   Fluoroscopic guided lumbar puncture and intrathecal injection of chemotherapy without complication.   03/31/2018 Procedure   Technically successful fluoroscopic guided lumbar puncture for intrathecal methotrexate injection. Procedure was well tolerated without evidence for immediate complications. Patient will recover in short stay prior to being discharged home   05/08/2018 PET scan   No residual/persistent enlarged or hypermetabolic lymph nodes in the neck, chest, abdomen or  pelvis. Findings suggest an excellent response to treatment.   05/25/2018 -  Chemotherapy   The patient had Revlimid for maintenance chemo    06/07/2018 Imaging   LV EF: 55% -  60%    09/25/2018 PET scan   1. Stable exam. No hypermetabolic lymphadenopathy in the neck, chest, abdomen, or pelvis. 2.  Aortic Atherosclerois (ICD10-170.0) 3.  Emphysema. (ICD10-J43.9)    04/09/2019 Imaging   1. No CT findings without contrast to suggest recurrent lymphoma involving the chest, abdomen or pelvis. 2. Stable advanced atherosclerotic calcifications involving the thoracic and abdominal aorta and branch vessels. 3. Moderate to large hiatal hernia. 4. No significant bony findings.   10/08/2019 Imaging   1. No CT findings to suggest recurrent lymphoma. No lymphadenopathy in the lower neck, chest, abdomen, pelvis or inguinal regions. 2. Stable advanced atherosclerotic calcifications involving the thoracic and abdominal aorta and branch vessels. 3. Moderate to large hiatal hernia. 4. Stable sclerotic bone lesions involving the right ischium and right hip. New left total hip arthroplasty noted.       REVIEW OF SYSTEMS:   Constitutional: Denies fevers, chills or abnormal weight loss Eyes: Denies blurriness of vision Ears, nose, mouth, throat, and face: Denies mucositis or sore throat Respiratory: Denies cough, dyspnea or wheezes Cardiovascular: Denies palpitation, chest discomfort or lower extremity swelling Gastrointestinal:  Denies nausea, heartburn or change in bowel habits Skin: Denies abnormal skin rashes Lymphatics: Denies new lymphadenopathy or easy bruising Neurological:Denies numbness, tingling or new weaknesses Behavioral/Psych: Mood is stable, no new changes  All other systems were reviewed with the patient and are negative.  I have reviewed the past medical history, past surgical history, social history and family history with the patient and they are unchanged from previous note.  ALLERGIES:  is allergic to contrast media [iodinated diagnostic agents]; iohexol; povidone-iodine; penicillins; and adhesive [tape].  MEDICATIONS:  Current Outpatient Medications   Medication Sig Dispense Refill  . acetaminophen (TYLENOL) 325 MG tablet Take 1-2 tablets (325-650 mg total) by mouth every 6 (six) hours as needed for mild pain (pain score 1-3 or temp > 100.5).    Marland Kitchen albuterol (PROVENTIL HFA;VENTOLIN HFA) 108 (90 Base) MCG/ACT inhaler Inhale 2 puffs into the lungs every 6 (six) hours as needed for wheezing or shortness of breath. 1 Inhaler 0  . BREO ELLIPTA 100-25 MCG/INH AEPB INHALE 1 PUFF INTO THE LUNGS DAILY AS DIRECTED 60 each 3  . cholecalciferol (VITAMIN D) 1000 units tablet Take 2,000 Units by mouth daily.    . polyethylene glycol (MIRALAX / GLYCOLAX) 17 g packet Take 17 g by mouth daily as needed. 14 each 0  . sertraline (ZOLOFT) 25 MG tablet TAKE ONE TABLET EVERY DAY 90 tablet 1   No current facility-administered medications for this visit.    PHYSICAL EXAMINATION: ECOG PERFORMANCE STATUS: 0 - Asymptomatic  Vitals:   12/04/19 1106  BP: (!) 142/76  Pulse: 77  Resp: 18  Temp: 98.3 F (36.8 C)  SpO2: 91%   Filed Weights   12/04/19 1106  Weight: 134 lb (60.8 kg)    GENERAL:alert, no distress and comfortable SKIN: skin color, texture, turgor are normal, no rashes or significant lesions EYES: normal, Conjunctiva are pink and non-injected, sclera clear OROPHARYNX:no exudate, no erythema and lips, buccal mucosa, and tongue normal  NECK: supple, thyroid normal size, non-tender, without nodularity LYMPH:  no palpable lymphadenopathy in the cervical, axillary or inguinal LUNGS: clear to auscultation and percussion with normal breathing effort HEART: regular rate & rhythm  and no murmurs and no lower extremity edema ABDOMEN:abdomen soft, non-tender and normal bowel sounds Musculoskeletal:no cyanosis of digits and no clubbing  NEURO: alert & oriented x 3 with fluent speech, no focal motor/sensory deficits  LABORATORY DATA:  I have reviewed the data as listed    Component Value Date/Time   NA 142 12/04/2019 1039   NA 142 12/28/2016 1002   K  4.1 12/04/2019 1039   K 4.6 12/28/2016 1002   CL 107 12/04/2019 1039   CL 101 03/28/2013 0826   CO2 28 12/04/2019 1039   CO2 29 12/28/2016 1002   GLUCOSE 85 12/04/2019 1039   GLUCOSE 85 12/28/2016 1002   GLUCOSE 84 03/28/2013 0826   BUN 16 12/04/2019 1039   BUN 20.3 12/28/2016 1002   CREATININE 0.73 12/04/2019 1039   CREATININE 0.78 05/03/2019 1019   CREATININE 0.9 12/28/2016 1002   CALCIUM 9.2 12/04/2019 1039   CALCIUM 10.1 12/28/2016 1002   PROT 6.2 (L) 12/04/2019 1039   PROT 6.8 12/28/2016 1002   ALBUMIN 3.9 12/04/2019 1039   ALBUMIN 4.2 12/28/2016 1002   AST 16 12/04/2019 1039   AST 12 (L) 05/03/2019 1019   AST 20 12/28/2016 1002   ALT 11 12/04/2019 1039   ALT 10 05/03/2019 1019   ALT 12 12/28/2016 1002   ALKPHOS 115 12/04/2019 1039   ALKPHOS 111 12/28/2016 1002   BILITOT 0.4 12/04/2019 1039   BILITOT 0.3 05/03/2019 1019   BILITOT 0.55 12/28/2016 1002   GFRNONAA >60 12/04/2019 1039   GFRNONAA >60 05/03/2019 1019   GFRAA >60 12/04/2019 1039   GFRAA >60 05/03/2019 1019    No results found for: SPEP, UPEP  Lab Results  Component Value Date   WBC 7.1 12/04/2019   NEUTROABS 4.4 12/04/2019   HGB 13.1 12/04/2019   HCT 39.2 12/04/2019   MCV 103.4 (H) 12/04/2019   PLT 251 12/04/2019      Chemistry      Component Value Date/Time   NA 142 12/04/2019 1039   NA 142 12/28/2016 1002   K 4.1 12/04/2019 1039   K 4.6 12/28/2016 1002   CL 107 12/04/2019 1039   CL 101 03/28/2013 0826   CO2 28 12/04/2019 1039   CO2 29 12/28/2016 1002   BUN 16 12/04/2019 1039   BUN 20.3 12/28/2016 1002   CREATININE 0.73 12/04/2019 1039   CREATININE 0.78 05/03/2019 1019   CREATININE 0.9 12/28/2016 1002      Component Value Date/Time   CALCIUM 9.2 12/04/2019 1039   CALCIUM 10.1 12/28/2016 1002   ALKPHOS 115 12/04/2019 1039   ALKPHOS 111 12/28/2016 1002   AST 16 12/04/2019 1039   AST 12 (L) 05/03/2019 1019   AST 20 12/28/2016 1002   ALT 11 12/04/2019 1039   ALT 10 05/03/2019 1019    ALT 12 12/28/2016 1002   BILITOT 0.4 12/04/2019 1039   BILITOT 0.3 05/03/2019 1019   BILITOT 0.55 12/28/2016 1002

## 2019-12-05 ENCOUNTER — Telehealth: Payer: Self-pay | Admitting: Hematology and Oncology

## 2019-12-05 NOTE — Telephone Encounter (Signed)
Scheduled appt per 2/23 sch message - pt aware of appt date and time

## 2020-01-29 ENCOUNTER — Other Ambulatory Visit: Payer: Self-pay

## 2020-01-29 ENCOUNTER — Inpatient Hospital Stay: Payer: Medicare Other | Attending: Hematology and Oncology

## 2020-01-29 ENCOUNTER — Inpatient Hospital Stay (HOSPITAL_BASED_OUTPATIENT_CLINIC_OR_DEPARTMENT_OTHER): Payer: Medicare Other | Admitting: Hematology and Oncology

## 2020-01-29 ENCOUNTER — Other Ambulatory Visit: Payer: Medicare Other

## 2020-01-29 ENCOUNTER — Inpatient Hospital Stay: Payer: Medicare Other

## 2020-01-29 ENCOUNTER — Ambulatory Visit: Payer: Medicare Other | Admitting: Hematology and Oncology

## 2020-01-29 ENCOUNTER — Ambulatory Visit: Payer: Medicare Other

## 2020-01-29 ENCOUNTER — Encounter: Payer: Self-pay | Admitting: Hematology and Oncology

## 2020-01-29 VITALS — BP 137/66 | HR 99 | Temp 98.7°F | Resp 19

## 2020-01-29 DIAGNOSIS — C8588 Other specified types of non-Hodgkin lymphoma, lymph nodes of multiple sites: Secondary | ICD-10-CM

## 2020-01-29 DIAGNOSIS — E610 Copper deficiency: Secondary | ICD-10-CM | POA: Diagnosis not present

## 2020-01-29 DIAGNOSIS — G620 Drug-induced polyneuropathy: Secondary | ICD-10-CM | POA: Insufficient documentation

## 2020-01-29 DIAGNOSIS — Z8572 Personal history of non-Hodgkin lymphomas: Secondary | ICD-10-CM | POA: Insufficient documentation

## 2020-01-29 DIAGNOSIS — E538 Deficiency of other specified B group vitamins: Secondary | ICD-10-CM | POA: Diagnosis not present

## 2020-01-29 DIAGNOSIS — Z452 Encounter for adjustment and management of vascular access device: Secondary | ICD-10-CM | POA: Diagnosis not present

## 2020-01-29 DIAGNOSIS — T451X5A Adverse effect of antineoplastic and immunosuppressive drugs, initial encounter: Secondary | ICD-10-CM

## 2020-01-29 DIAGNOSIS — C8338 Diffuse large B-cell lymphoma, lymph nodes of multiple sites: Secondary | ICD-10-CM

## 2020-01-29 LAB — CBC WITH DIFFERENTIAL/PLATELET
Abs Immature Granulocytes: 0.04 10*3/uL (ref 0.00–0.07)
Basophils Absolute: 0.1 10*3/uL (ref 0.0–0.1)
Basophils Relative: 1 %
Eosinophils Absolute: 0.3 10*3/uL (ref 0.0–0.5)
Eosinophils Relative: 5 %
HCT: 38.5 % (ref 36.0–46.0)
Hemoglobin: 13 g/dL (ref 12.0–15.0)
Immature Granulocytes: 1 %
Lymphocytes Relative: 27 %
Lymphs Abs: 1.6 10*3/uL (ref 0.7–4.0)
MCH: 34.3 pg — ABNORMAL HIGH (ref 26.0–34.0)
MCHC: 33.8 g/dL (ref 30.0–36.0)
MCV: 101.6 fL — ABNORMAL HIGH (ref 80.0–100.0)
Monocytes Absolute: 0.7 10*3/uL (ref 0.1–1.0)
Monocytes Relative: 11 %
Neutro Abs: 3.4 10*3/uL (ref 1.7–7.7)
Neutrophils Relative %: 55 %
Platelets: 238 10*3/uL (ref 150–400)
RBC: 3.79 MIL/uL — ABNORMAL LOW (ref 3.87–5.11)
RDW: 13.5 % (ref 11.5–15.5)
WBC: 6.2 10*3/uL (ref 4.0–10.5)
nRBC: 0 % (ref 0.0–0.2)

## 2020-01-29 LAB — COMPREHENSIVE METABOLIC PANEL
ALT: 9 U/L (ref 0–44)
AST: 16 U/L (ref 15–41)
Albumin: 3.9 g/dL (ref 3.5–5.0)
Alkaline Phosphatase: 126 U/L (ref 38–126)
Anion gap: 10 (ref 5–15)
BUN: 17 mg/dL (ref 8–23)
CO2: 26 mmol/L (ref 22–32)
Calcium: 9.2 mg/dL (ref 8.9–10.3)
Chloride: 107 mmol/L (ref 98–111)
Creatinine, Ser: 0.82 mg/dL (ref 0.44–1.00)
GFR calc Af Amer: >60 mL/min (ref >60)
GFR calc non Af Amer: >60 mL/min (ref >60)
Glucose, Bld: 91 mg/dL (ref 70–99)
Potassium: 3.7 mmol/L (ref 3.5–5.1)
Sodium: 143 mmol/L (ref 135–145)
Total Bilirubin: 0.5 mg/dL (ref 0.3–1.2)
Total Protein: 6.3 g/dL — ABNORMAL LOW (ref 6.5–8.1)

## 2020-01-29 MED ORDER — HEPARIN SOD (PORK) LOCK FLUSH 100 UNIT/ML IV SOLN
500.0000 [IU] | Freq: Once | INTRAVENOUS | Status: AC
  Administered 2020-01-29: 09:00:00 500 [IU]
  Filled 2020-01-29: qty 5

## 2020-01-29 MED ORDER — CYANOCOBALAMIN 1000 MCG/ML IJ SOLN
1000.0000 ug | Freq: Once | INTRAMUSCULAR | Status: AC
  Administered 2020-01-29: 09:00:00 1000 ug via INTRAMUSCULAR

## 2020-01-29 MED ORDER — SODIUM CHLORIDE 0.9% FLUSH
10.0000 mL | Freq: Once | INTRAVENOUS | Status: AC
  Administered 2020-01-29: 10 mL
  Filled 2020-01-29: qty 10

## 2020-01-29 NOTE — Assessment & Plan Note (Signed)
She has received multiple doses of vitamin B12 for severe B12 deficiency I plan to recheck B12 level in her next visit

## 2020-01-29 NOTE — Progress Notes (Signed)
[place] OFFICE PROGRESS NOTE  Patient Care Team: [name], NP as PCP - General (Family Medicine)  ASSESSMENT & PLAN:  Large cell lymphoma of multiple sites [facility]) Clinically, she has no signs of cancer recurrence Her last CT scan in Dec 2020 was unremarkable She is still at high risk of cancer recurrence I will continue port maintenance with blood work, physical examination in about 8 weeks  Vitamin B12 deficiency She has received multiple doses of vitamin B12 for severe B12 deficiency I plan to recheck B12 level in her next visit  Peripheral neuropathy due to chemotherapy [facility]) She has persistent neuropathy in her feet likely secondary to side effects of chemo I will also order copper level in her next visit to exclude copper deficiency as a contributing factor to peripheral neuropathy   Orders Placed This Encounter  Procedures  . Vitamin B12    Standing Status:   Future    Standing Expiration Date:   03/04/2021  . Copper, serum    Standing Status:   Future    Standing Expiration Date:   01/28/2021    All questions were answered. The patient knows to call the clinic with any problems, questions or concerns. The total time spent in the appointment was 20 minutes encounter with patients including review of chart and various tests results, discussions about plan of care and coordination of care plan   [name], MD 01/29/2020 10:20 AM  INTERVAL HISTORY: Please see below for problem oriented charting. 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 She was treated with chlorambucil in 2000.   --2007 - 09/2008.  Treated with single agent Rituxan.   --12/2008.  She had progressive adenopathy in chest and abdomen and repeat biopsy showed low grade follicular and diffuse B cell NHL.   --01/2009 - 07/2009. She required left ureteral stent and was treated with Treanda/Rituxan x 5 cycles from 02-2009 thru 06-2009.    09/16/2014 Imaging   No lymphadenopathy in the chest, abdomen, or pelvis. The small right axillary lymph nodes seen on the previous exam are unchanged in the 18 month interval. Small to moderate hiatal hernia.   11/20/2017 Imaging   Mildly displaced and angulated pathologic fracture of the proximal humeral shaft. 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    11/22/2017 PET scan   New hypermetabolic lymphadenopathy within the chest and abdomen, consistent with recurrent lymphoma. New large hypermetabolic mass in the right hepatic lobe, consistent with lymphoma this involvement. New small hypermetabolic mass in the left kidney, which may be due to lymphomatous involvement although primary renal cell carcinoma cannot be excluded. Hypermetabolic mass in the right humerus with pathologic fracture, consistent with lymphomatous involvement. Small hypermetabolic foci in right posterior chest wall, also suspicious for recurrent lymphoma.   11/23/2017 Procedure   Bone, fragment(s), Right Humeral Shaft Reamings - ATYPICAL LYMPHOID INFILTRATE CONSISTENT WITH NON-HODGKIN'S B  CELL LYMPHOMA. - SEE ONCOLOGY TABLE. Microscopic Comment LYMPHOMA Histologic type: Non-Hodgkins lymphoma, favor large cell type. Grade (if applicable): Favor high grade. Flow cytometry: No specimen is available for  analysis. Immunohistochemical stains: BCL-2, BCL-6, CD3, CD5, CD10, CD15, CD20, CD30, CD34, CD43, LCA, CD79a, CD138, Ki-67, PAX-5 and TdT with appropriate controls as performed on block 1C. Touch preps/imprints: Not performed. Comments: The sections show multiple, primarily soft tissue fragments displaying a dense lymphoid infiltrate characterized by a mixture of small round to slightly irregular lymphocytes, histiocytes, and large centroblastic appearing lymphoid cells with partially clumped to vesicular chromatin, small nucleoli and amphophilic to clear cytoplasm. The number of admixed large lymphoid cells is variable but they appear relatively abundant in some areas associated with clustering. No material is available for flow cytometric analysis. Hence, a large batter of immunohistochemical stains were performed and show a mixture of T and B cells but with a significant B cell component mostly composed of large lymphoid cells with positivity for CD79a, PAX-5, CD30, BCL-2, and BCL-6. LCA shows diffuse staining although some of the large lymphoid cells appear negative. No significant staining is seen with CD20, CD10, CD15, CD34, TdT, or CD138. The lack of CD20 expression in this material is possibly related to prior treatment. There is variable increased expression of Ki-67 ranging from 10 to >50% in some areas particularly where there is abundance of large lymphoid cells. There is admixed abundant T cell population in the background as seen with CD3, CD5, and CD43 with on apparent co-expression of CD5 in B cell areas. The overall findings are consistent with involvement by non-Hodgkin's B cell lymphoma and the presence of relative abundance of large lymphoid B-cells and increased Ki-67 expression favor a high grade large B cell lymphoma.   11/23/2017 Surgery   1. CPT 24516-Intramedullary nailing of right humerus fracture 2. CPT 20245-Bone biopsy, deep     11/23/2017 - 11/25/2017 Hospital Admission    She was admitted to the hospital for management of humerus fracture and aggressive lymphoma   11/24/2017 Imaging   ECHO showed LV EF: 65% -   70%   11/25/2017 Procedure   Status post ultrasound-guided biopsy of liver mass. Tissue specimen sent to pathology for complete histopathologic analysis   12/07/2017 Imaging   Successful placement of a right internal jugular approach power injectable Port-A-Cath. The catheter is ready for immediate use.   12/09/2017 - 03/29/2018 Chemotherapy   The patient had R-CHOP chemo     12/17/2017 - 12/20/2017 Hospital Admission   She was hospitalized for influenza infection   01/03/2018 Procedure   Unsuccessful initial L4-5 LP. Successful L2-3 lumbar puncture with installation of methotrexate.   01/24/2018 Procedure   Non complicated injection of intrathecal methotrexate, as above.   02/09/2018 Imaging   LV EF: 55% -  60%   02/10/2018 PET scan   1. Interval response to therapy. There has been resolution of previous hypermetabolic tumor within the chest and abdomen. Deauville criteria 2 and 3. 2. Diffuse radiotracer uptake is identified within the axial and appendicular skeleton which is favored to represent treatment related changes. 3. Significant decrease in FDG uptake associated with previously noted hypermetabolic tumor involving the proximal humeral diaphysis. The FDG uptake within this area on today's study is equivalent to that which is seen in the lumbar spine. 4. Aortic Atherosclerosis (ICD10-I70.0). Lad coronary artery calcifications. 5. Hiatal hernia   02/15/2018 Procedure   Fluoroscopic guided lumbar puncture and intrathecal injection of chemotherapy without complication.   03/31/2018 Procedure  Technically successful fluoroscopic guided lumbar puncture for intrathecal methotrexate injection. Procedure was well tolerated without evidence for immediate complications. Patient will recover in short stay prior to being discharged home   05/08/2018 PET scan    No residual/persistent enlarged or hypermetabolic lymph nodes in the neck, chest, abdomen or pelvis. Findings suggest an excellent response to treatment.   05/25/2018 -  Chemotherapy   The patient had Revlimid for maintenance chemo    06/07/2018 Imaging   LV EF: 55% -  60%   09/25/2018 PET scan   1. Stable exam. No hypermetabolic lymphadenopathy in the neck, chest, abdomen, or pelvis. 2.  Aortic Atherosclerois (ICD10-170.0) 3.  Emphysema. (ICD10-J43.9)    04/09/2019 Imaging   1. No CT findings without contrast to suggest recurrent lymphoma involving the chest, abdomen or pelvis. 2. Stable advanced atherosclerotic calcifications involving the thoracic and abdominal aorta and branch vessels. 3. Moderate to large hiatal hernia. 4. No significant bony findings.   10/08/2019 Imaging   1. No CT findings to suggest recurrent lymphoma. No lymphadenopathy in the lower neck, chest, abdomen, pelvis or inguinal regions. 2. Stable advanced atherosclerotic calcifications involving the thoracic and abdominal aorta and branch vessels. 3. Moderate to large hiatal hernia. 4. Stable sclerotic bone lesions involving the right ischium and right hip. New left total hip arthroplasty noted.       REVIEW OF SYSTEMS:   Constitutional: Denies fevers, chills or abnormal weight loss Eyes: Denies blurriness of vision Ears, nose, mouth, throat, and face: Denies mucositis or sore throat Respiratory: Denies cough, dyspnea or wheezes Cardiovascular: Denies palpitation, chest discomfort or lower extremity swelling Gastrointestinal:  Denies nausea, heartburn or change in bowel habits Skin: Denies abnormal skin rashes Lymphatics: Denies new lymphadenopathy or easy bruising Behavioral/Psych: Mood is stable, no new changes  All other systems were reviewed with the patient and are negative.  I have reviewed the past medical history, past surgical history, social history and family history with the patient and  they are unchanged from previous note.  ALLERGIES:  is allergic to contrast media [iodinated diagnostic agents]; iohexol; povidone-iodine; penicillins; and adhesive [tape].  MEDICATIONS:  Current Outpatient Medications  Medication Sig Dispense Refill  . acetaminophen (TYLENOL) 325 MG tablet Take 1-2 tablets (325-650 mg total) by mouth every 6 (six) hours as needed for mild pain (pain score 1-3 or temp > 100.5).    Marland Kitchen albuterol (PROVENTIL HFA;VENTOLIN HFA) 108 (90 Base) MCG/ACT inhaler Inhale 2 puffs into the lungs every 6 (six) hours as needed for wheezing or shortness of breath. 1 Inhaler 0  . BREO ELLIPTA 100-25 MCG/INH AEPB INHALE 1 PUFF INTO THE LUNGS DAILY AS DIRECTED 60 each 3  . cholecalciferol (VITAMIN D) 1000 units tablet Take 2,000 Units by mouth daily.    . polyethylene glycol (MIRALAX / GLYCOLAX) 17 g packet Take 17 g by mouth daily as needed. 14 each 0  . sertraline (ZOLOFT) 25 MG tablet TAKE ONE TABLET EVERY DAY 90 tablet 1   No current facility-administered medications for this visit.    PHYSICAL EXAMINATION: ECOG PERFORMANCE STATUS: 1 - Symptomatic but completely ambulatory  Vitals:   01/29/20 0937  BP: 137/66  Pulse: 99  Resp: 19  Temp: 98.7 F (37.1 C)  SpO2: 98%   Filed Weights   01/29/20 0937  Weight: 136 lb 3.2 oz (61.8 kg)    GENERAL:alert, no distress and comfortable SKIN: skin color, texture, turgor are normal, no rashes or significant lesions EYES: normal, Conjunctiva are pink and non-injected,  sclera clear OROPHARYNX:no exudate, no erythema and lips, buccal mucosa, and tongue normal  NECK: supple, thyroid normal size, non-tender, without nodularity LYMPH:  no palpable lymphadenopathy in the cervical, axillary or inguinal LUNGS: clear to auscultation and percussion with normal breathing effort HEART: regular rate & rhythm and no murmurs and no lower extremity edema ABDOMEN:abdomen soft, non-tender and normal bowel sounds Musculoskeletal:no cyanosis  of digits and no clubbing  NEURO: alert & oriented x 3 with fluent speech, no focal motor/sensory deficits  LABORATORY DATA:  I have reviewed the data as listed    Component Value Date/Time   NA 143 01/29/2020 0915   NA 142 12/28/2016 1002   K 3.7 01/29/2020 0915   K 4.6 12/28/2016 1002   CL 107 01/29/2020 0915   CL 101 03/28/2013 0826   CO2 26 01/29/2020 0915   CO2 29 12/28/2016 1002   GLUCOSE 91 01/29/2020 0915   GLUCOSE 85 12/28/2016 1002   GLUCOSE 84 03/28/2013 0826   BUN 17 01/29/2020 0915   BUN 20.3 12/28/2016 1002   CREATININE 0.82 01/29/2020 0915   CREATININE 0.78 05/03/2019 1019   CREATININE 0.9 12/28/2016 1002   CALCIUM 9.2 01/29/2020 0915   CALCIUM 10.1 12/28/2016 1002   PROT 6.3 (L) 01/29/2020 0915   PROT 6.8 12/28/2016 1002   ALBUMIN 3.9 01/29/2020 0915   ALBUMIN 4.2 12/28/2016 1002   AST 16 01/29/2020 0915   AST 12 (L) 05/03/2019 1019   AST 20 12/28/2016 1002   ALT 9 01/29/2020 0915   ALT 10 05/03/2019 1019   ALT 12 12/28/2016 1002   ALKPHOS 126 01/29/2020 0915   ALKPHOS 111 12/28/2016 1002   BILITOT 0.5 01/29/2020 0915   BILITOT 0.3 05/03/2019 1019   BILITOT 0.55 12/28/2016 1002   GFRNONAA >60 01/29/2020 0915   GFRNONAA >60 05/03/2019 1019   GFRAA >60 01/29/2020 0915   GFRAA >60 05/03/2019 1019    No results found for: SPEP, UPEP  Lab Results  Component Value Date   WBC 6.2 01/29/2020   NEUTROABS 3.4 01/29/2020   HGB 13.0 01/29/2020   HCT 38.5 01/29/2020   MCV 101.6 (H) 01/29/2020   PLT 238 01/29/2020      Chemistry      Component Value Date/Time   NA 143 01/29/2020 0915   NA 142 12/28/2016 1002   K 3.7 01/29/2020 0915   K 4.6 12/28/2016 1002   CL 107 01/29/2020 0915   CL 101 03/28/2013 0826   CO2 26 01/29/2020 0915   CO2 29 12/28/2016 1002   BUN 17 01/29/2020 0915   BUN 20.3 12/28/2016 1002   CREATININE 0.82 01/29/2020 0915   CREATININE 0.78 05/03/2019 1019   CREATININE 0.9 12/28/2016 1002      Component Value Date/Time    CALCIUM 9.2 01/29/2020 0915   CALCIUM 10.1 12/28/2016 1002   ALKPHOS 126 01/29/2020 0915   ALKPHOS 111 12/28/2016 1002   AST 16 01/29/2020 0915   AST 12 (L) 05/03/2019 1019   AST 20 12/28/2016 1002   ALT 9 01/29/2020 0915   ALT 10 05/03/2019 1019   ALT 12 12/28/2016 1002   BILITOT 0.5 01/29/2020 0915   BILITOT 0.3 05/03/2019 1019   BILITOT 0.55 12/28/2016 1002

## 2020-01-29 NOTE — Assessment & Plan Note (Signed)
Clinically, she has no signs of cancer recurrence Her last CT scan in Dec 2020 was unremarkable She is still at high risk of cancer recurrence I will continue port maintenance with blood work, physical examination in about 8 weeks

## 2020-01-29 NOTE — Assessment & Plan Note (Signed)
She has persistent neuropathy in her feet likely secondary to side effects of chemo I will also order copper level in her next visit to exclude copper deficiency as a contributing factor to peripheral neuropathy

## 2020-01-30 ENCOUNTER — Telehealth: Payer: Self-pay | Admitting: Hematology and Oncology

## 2020-01-30 NOTE — Telephone Encounter (Signed)
Scheduled appts per 4/20 sch msg. Pt confirmed appt date and times.

## 2020-02-19 ENCOUNTER — Other Ambulatory Visit: Payer: Self-pay | Admitting: Adult Health

## 2020-02-21 ENCOUNTER — Other Ambulatory Visit: Payer: Self-pay | Admitting: Hematology and Oncology

## 2020-03-12 ENCOUNTER — Other Ambulatory Visit: Payer: Self-pay

## 2020-03-13 ENCOUNTER — Encounter: Payer: Self-pay | Admitting: Adult Health

## 2020-03-13 ENCOUNTER — Ambulatory Visit (INDEPENDENT_AMBULATORY_CARE_PROVIDER_SITE_OTHER): Payer: Medicare Other | Admitting: Adult Health

## 2020-03-13 VITALS — BP 120/80 | HR 82 | Temp 98.2°F | Wt 136.0 lb

## 2020-03-13 DIAGNOSIS — F329 Major depressive disorder, single episode, unspecified: Secondary | ICD-10-CM | POA: Diagnosis not present

## 2020-03-13 DIAGNOSIS — J449 Chronic obstructive pulmonary disease, unspecified: Secondary | ICD-10-CM

## 2020-03-13 DIAGNOSIS — G629 Polyneuropathy, unspecified: Secondary | ICD-10-CM | POA: Diagnosis not present

## 2020-03-13 DIAGNOSIS — F32A Depression, unspecified: Secondary | ICD-10-CM

## 2020-03-13 DIAGNOSIS — S71002A Unspecified open wound, left hip, initial encounter: Secondary | ICD-10-CM

## 2020-03-13 MED ORDER — BREO ELLIPTA 200-25 MCG/INH IN AEPB: 1.0000 | INHALATION_SPRAY | Freq: Every day | RESPIRATORY_TRACT | 6 refills | Status: DC

## 2020-03-13 MED ORDER — SERTRALINE HCL 25 MG PO TABS: 25.0000 mg | ORAL_TABLET | Freq: Every day | ORAL | 1 refills | Status: DC

## 2020-03-13 NOTE — Progress Notes (Signed)
Subjective:    [name], female    DOB: June 15, 1941, 79 y.o.   MRN: [id number]  HPI  79 year old female who  has a past medical history of Cancer ([place]) ( APRIL OF 2000), COPD (chronic obstructive pulmonary disease) ([place]), Depression, Emphysema of lung ([place]), GERD (gastroesophageal reflux disease), Hyperlipidemia, and S/p left hip fracture (04/15/2019).  She presents to the office today for multiple issues.   COPD - she is currently prescribed Breo Ellipta 100/50 which is managed by myself. She feels as though over time her breathing has started to become worse. She has been on Breo for about 3 years and at the beginning she was well controlled but no longer feels like them.  She reports even walking short distances because of the shortness of breath.  She does use her albuterol inhaler approximately once a week.  Non Healing Wound -she had a left total hip done in June 2020.  Surgical wound healed well but she continues to have a small wound on the surface of her left hip that does not seem to heal well.  She has been placing bandages on this but when she removes the bandages it pulls off the skin thus not letting the wound heal.  She does report some clear drainage but has not noticed any signs or symptoms of infection.  Neuropathy -she has persistent neuropathy in both feet which is felt to be secondary to side effects of chemo.  Her oncologist has been doing B12 injections in the hopes that this would help but the patient does not feel as though it is helping.  She has an upcoming appointment where copper levels will be checked to rule out copper deficiency as a cause.  She is wondering if there is any other treatment.  She also needs her antidepressant refilled   Review of Systems See HPI   Past Medical History:  Diagnosis Date  . Cancer ([place])  APRIL OF 2000   LOW-GRADE NON-HODGKIN'S LYMPHOMA  . COPD (chronic obstructive pulmonary disease) ([place])   . Depression   .  Emphysema of lung ([place])   . GERD (gastroesophageal reflux disease)   . Hyperlipidemia   . S/p left hip fracture 04/15/2019    Social History   Socioeconomic History  . Marital status: Married    Spouse name: Not on file  . Number of children: Not on file  . Years of education: Not on file  . Highest education level: Not on file  Occupational History  . Not on file  Tobacco Use  . Smoking status: Former Smoker    Packs/day: 0.25    Years: 51.00    Pack years: 12.75    Types: Cigarettes    Quit date: 03/12/2015    Years since quitting: 5.0  . Smokeless tobacco: Never Used  Substance and Sexual Activity  . Alcohol use: Yes    Alcohol/week: 0.0 standard drinks    Comment: 1 glass of wine monthly  . Drug use: No  . Sexual activity: Not Currently    Birth control/protection: Post-menopausal  Other Topics Concern  . 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  Pets: Dog      Diet: Healthy diet, fruits, veggies and fish.    Exercise: None       Social Determinants of Health   Financial Resource Strain:   . Difficulty of Paying Living Expenses:   Food Insecurity:   . Worried About Charity fundraiser in the Last Year:   . Arboriculturist in the Last Year:   Transportation Needs:   . Film/video editor (Medical):   Marland Kitchen Lack of Transportation (Non-Medical):   Physical Activity:   . Days of Exercise per Week:   . Minutes of Exercise per Session:   Stress:   . Feeling of Stress :   Social Connections:   . Frequency of Communication with Friends and Family:   . Frequency of Social Gatherings with Friends and Family:   . Attends Religious Services:   . Active Member of Clubs or Organizations:   . Attends Archivist Meetings:   Marland Kitchen Marital Status:     Intimate Partner Violence:   . Fear of Current or Ex-Partner:   . Emotionally Abused:   Marland Kitchen Physically Abused:   . Sexually Abused:     Past Surgical History:  Procedure Laterality Date  . 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 FRACTURE SURGERY    . IR FLUORO GUIDE PORT INSERTION RIGHT  12/07/2017  . IR US GUIDE VASC ACCESS RIGHT  12/07/2017  . ORIF HUMERUS FRACTURE Right 11/23/2017   Procedure: OPEN REDUCTION INTERNAL FIXATION (ORIF) HUMERAL SHAFT FRACTURE;  Surgeon: Shona Needles, MD;  Location: [facility];  Service: Orthopedics;  Laterality: Right;  . RIGHT OOPHORECTOMY  1976  . 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   Procedure: TOTAL HIP ARTHROPLASTY ANTERIOR APPROACH LEFT HIP;  Surgeon: Rod Can, MD;  Location: [facility];  Service: Orthopedics;  Laterality: Left;    Family History  Problem Relation Age of Onset  . Cancer Mother        colon ca    Allergies  Allergen Reactions  . Contrast Media [Iodinated Diagnostic Agents] Shortness Of Breath, Rash and Other (See Comments)    Throat swells  . Iohexol Shortness Of Breath  . Povidone-Iodine Hives, Shortness Of Breath and Rash    REACTION: throat swells  . Penicillins Hives, Itching and Rash    Has patient had a PCN reaction causing immediate rash, facial/tongue/throat swelling, SOB or lightheadedness with hypotension: Unknown Has patient had a PCN reaction causing severe rash involving mucus membranes or skin necrosis: Yes Has patient had a PCN reaction that required hospitalization: Unknown Has patient had a PCN reaction occurring within the last 10 years: Unknown If all of the above answers are "NO", then may proceed with Cephalosporin use.   . Adhesive [Tape] Hives    Current Outpatient Medications on File Prior to Visit  Medication Sig Dispense Refill  . albuterol (PROVENTIL HFA;VENTOLIN HFA) 108 (90 Base) MCG/ACT inhaler Inhale 2 puffs into the  lungs every 6 (six) hours as needed for wheezing or shortness of breath. 1 Inhaler 0  . aspirin EC 81 MG tablet Take 81 mg by mouth daily.    . cholecalciferol (VITAMIN D) 1000 units tablet Take 2,000 Units by mouth daily.    . Probiotic Product (PROBIOTIC PO) Take by mouth.     No current facility-administered medications on file prior to visit.    BP 120/80   Pulse 82  Temp 98.2 F (36.8 C)   Wt 136 lb (61.7 kg)   LMP  (LMP Unknown)   SpO2 96%   BMI 23.34 kg/m       Objective:   Physical Exam Vitals and nursing note reviewed.  Constitutional:      Appearance: She is well-developed.  Cardiovascular:     Pulses: Normal pulses.     Heart sounds: Normal heart sounds.  Pulmonary:     Effort: Pulmonary effort is normal.     Breath sounds: Normal breath sounds. No stridor. No wheezing or rhonchi.  Musculoskeletal:        General: Normal range of motion.  Skin:    General: Skin is warm and dry.     Findings: Wound present.     Comments: Approximately 1.25 inch superficial wound noted to left hip.  No active drainage noted.  No signs of infection present.  Neurological:     General: No focal deficit present.     Mental Status: She is alert and oriented to person, place, and time.  Psychiatric:        Mood and Affect: Mood normal.        Behavior: Behavior normal.        Thought Content: Thought content normal.        Judgment: Judgment normal.       Assessment & Plan:  1. COPD GOLD II -We will increase Breo to 200-25.  She was advised to follow-up this does not improve with her breathing.  She may need to return to pulmonary - fluticasone furoate-vilanterol (BREO ELLIPTA) 200-25 MCG/INH AEPB; Inhale 1 puff into the lungs daily.  Dispense: 60 each; Refill: 6  2. Open wound of left hip, initial encounter -No active infection noted.  Wound actually looks pretty good.  Wound dressed with Vaseline gauze and nonstick bandage.  She was given supplies to do this at home.  She  was advised to follow-up if she does not notice any wound healing in the next week  3. Neuropathy -Discussed different treatments for neuropathy.  She is not wanting to start have a PET scan or Lyrica at this time, which I am fine with as I do not find this treatment to be effective.  She will start exercising more her to see if that helps.  4. Depression, unspecified depression type  - sertraline (ZOLOFT) 25 MG tablet; Take 1 tablet (25 mg total) by mouth daily.  Dispense: 90 tablet; Refill: 1   [name], NP

## 2020-03-25 ENCOUNTER — Inpatient Hospital Stay: Payer: Medicare Other | Attending: Hematology and Oncology

## 2020-03-25 ENCOUNTER — Telehealth: Payer: Self-pay | Admitting: Hematology and Oncology

## 2020-03-25 ENCOUNTER — Telehealth: Payer: Self-pay

## 2020-03-25 ENCOUNTER — Inpatient Hospital Stay: Payer: Medicare Other

## 2020-03-25 ENCOUNTER — Other Ambulatory Visit: Payer: Self-pay

## 2020-03-25 ENCOUNTER — Inpatient Hospital Stay (HOSPITAL_BASED_OUTPATIENT_CLINIC_OR_DEPARTMENT_OTHER): Payer: Medicare Other | Admitting: Hematology and Oncology

## 2020-03-25 ENCOUNTER — Encounter: Payer: Self-pay | Admitting: Hematology and Oncology

## 2020-03-25 DIAGNOSIS — E538 Deficiency of other specified B group vitamins: Secondary | ICD-10-CM | POA: Diagnosis not present

## 2020-03-25 DIAGNOSIS — Z8572 Personal history of non-Hodgkin lymphomas: Secondary | ICD-10-CM | POA: Diagnosis not present

## 2020-03-25 DIAGNOSIS — G620 Drug-induced polyneuropathy: Secondary | ICD-10-CM

## 2020-03-25 DIAGNOSIS — E610 Copper deficiency: Secondary | ICD-10-CM

## 2020-03-25 DIAGNOSIS — C8588 Other specified types of non-Hodgkin lymphoma, lymph nodes of multiple sites: Secondary | ICD-10-CM | POA: Diagnosis not present

## 2020-03-25 DIAGNOSIS — T451X5A Adverse effect of antineoplastic and immunosuppressive drugs, initial encounter: Secondary | ICD-10-CM | POA: Diagnosis not present

## 2020-03-25 DIAGNOSIS — C8338 Diffuse large B-cell lymphoma, lymph nodes of multiple sites: Secondary | ICD-10-CM

## 2020-03-25 LAB — CBC WITH DIFFERENTIAL/PLATELET
Abs Immature Granulocytes: 0.05 10*3/uL (ref 0.00–0.07)
Basophils Absolute: 0.1 10*3/uL (ref 0.0–0.1)
Basophils Relative: 1 %
Eosinophils Absolute: 0.4 10*3/uL (ref 0.0–0.5)
Eosinophils Relative: 5 %
HCT: 39.9 % (ref 36.0–46.0)
Hemoglobin: 13.3 g/dL (ref 12.0–15.0)
Immature Granulocytes: 1 %
Lymphocytes Relative: 18 %
Lymphs Abs: 1.3 10*3/uL (ref 0.7–4.0)
MCH: 34.5 pg — ABNORMAL HIGH (ref 26.0–34.0)
MCHC: 33.3 g/dL (ref 30.0–36.0)
MCV: 103.4 fL — ABNORMAL HIGH (ref 80.0–100.0)
Monocytes Absolute: 0.9 10*3/uL (ref 0.1–1.0)
Monocytes Relative: 12 %
Neutro Abs: 4.8 10*3/uL (ref 1.7–7.7)
Neutrophils Relative %: 63 %
Platelets: 258 10*3/uL (ref 150–400)
RBC: 3.86 MIL/uL — ABNORMAL LOW (ref 3.87–5.11)
RDW: 13.5 % (ref 11.5–15.5)
WBC: 7.5 10*3/uL (ref 4.0–10.5)
nRBC: 0 % (ref 0.0–0.2)

## 2020-03-25 LAB — VITAMIN B12: Vitamin B-12: 166 pg/mL — ABNORMAL LOW (ref 180–914)

## 2020-03-25 LAB — COMPREHENSIVE METABOLIC PANEL
ALT: 10 U/L (ref 0–44)
AST: 16 U/L (ref 15–41)
Albumin: 4 g/dL (ref 3.5–5.0)
Alkaline Phosphatase: 133 U/L — ABNORMAL HIGH (ref 38–126)
Anion gap: 10 (ref 5–15)
BUN: 17 mg/dL (ref 8–23)
CO2: 26 mmol/L (ref 22–32)
Calcium: 9.8 mg/dL (ref 8.9–10.3)
Chloride: 108 mmol/L (ref 98–111)
Creatinine, Ser: 0.91 mg/dL (ref 0.44–1.00)
GFR calc Af Amer: >60 mL/min (ref >60)
GFR calc non Af Amer: >60 mL/min (ref >60)
Glucose, Bld: 82 mg/dL (ref 70–99)
Potassium: 4 mmol/L (ref 3.5–5.1)
Sodium: 144 mmol/L (ref 135–145)
Total Bilirubin: 0.5 mg/dL (ref 0.3–1.2)
Total Protein: 6.5 g/dL (ref 6.5–8.1)

## 2020-03-25 NOTE — Assessment & Plan Note (Signed)
Clinically, she has no signs of cancer recurrence Her last CT scan in Dec 2020 was unremarkable She is still at high risk of cancer recurrence I will continue port maintenance with blood work, physical examination in about 8 weeks For now, I do not feel strongly she needs routine surveillance imaging study

## 2020-03-25 NOTE — Progress Notes (Signed)
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) Clinically, she has no signs of cancer recurrence Her last CT scan in Dec 2020 was unremarkable She is still at high risk of cancer recurrence I will continue port maintenance with blood work, physical examination in about 8 weeks For now, I do not feel strongly she needs routine surveillance imaging study  Vitamin B12 deficiency She has persistent vitamin B12 deficiency since I space out the frequency of the injection I will schedule weekly injection for month and then once a month for several months   Peripheral neuropathy due to chemotherapy [facility]) She has persistent neuropathy in her feet likely secondary to side effects of chemo and vitamin B12 deficiency    No orders of the defined types were placed in this encounter.   All questions were answered. The patient knows to call the clinic with any problems, questions or concerns. The total time spent in the appointment was 20 minutes encounter with patients including review of chart and various tests results, discussions about plan of care and coordination of care plan   [name], MD 03/25/2020 1:53 PM  INTERVAL HISTORY: Please see below for problem oriented charting. 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  Presented w bulky adenopathy, splenomegaly and bone marrow involvement c/w B-cell nonHodgkins lymphoma, low grade mixed follicular and  diffuse. She was treated with chlorambucil in 2000.   --2007 - 09/2008.  Treated with single agent Rituxan.   --12/2008.  She had progressive adenopathy in chest and abdomen and repeat biopsy showed low grade follicular and diffuse B cell NHL.   --01/2009 - 07/2009. She required left ureteral stent and was treated with Treanda/Rituxan x 5 cycles from 02-2009 thru 06-2009.    09/16/2014 Imaging   No lymphadenopathy in the chest, abdomen, or pelvis. The small right axillary lymph nodes seen on the previous exam are unchanged in the 18 month interval. Small to moderate hiatal hernia.   11/20/2017 Imaging   Mildly displaced and angulated pathologic fracture of the proximal humeral shaft. 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    11/22/2017 PET scan   New hypermetabolic lymphadenopathy within the chest and abdomen, consistent with recurrent lymphoma. New large hypermetabolic mass in the right hepatic lobe, consistent with lymphoma this involvement. New small hypermetabolic mass in the left kidney, which may be due to lymphomatous involvement although primary renal cell carcinoma cannot be excluded. Hypermetabolic mass in the right humerus with pathologic fracture, consistent with lymphomatous involvement. Small hypermetabolic foci in right posterior chest wall, also suspicious for recurrent lymphoma.   11/23/2017 Procedure   Bone, fragment(s), Right Humeral Shaft Reamings - ATYPICAL LYMPHOID INFILTRATE CONSISTENT WITH NON-HODGKIN'S B CELL LYMPHOMA. - SEE ONCOLOGY TABLE. Microscopic Comment LYMPHOMA Histologic type: Non-Hodgkins lymphoma, favor large cell type. Grade (if applicable): Favor high grade. Flow cytometry: No specimen is available for analysis. Immunohistochemical stains: BCL-2, BCL-6, CD3, CD5, CD10, CD15, CD20, CD30, CD34, CD43, LCA, CD79a, CD138, Ki-67,  PAX-5 and TdT with appropriate controls as performed on block 1C. Touch preps/imprints: Not  performed. Comments: The sections show multiple, primarily soft tissue fragments displaying a dense lymphoid infiltrate characterized by a mixture of small round to slightly irregular lymphocytes, histiocytes, and large centroblastic appearing lymphoid cells with partially clumped to vesicular chromatin, small nucleoli and amphophilic to clear cytoplasm. The number of admixed large lymphoid cells is variable but they appear relatively abundant in some areas associated with clustering. No material is available for flow cytometric analysis. Hence, a large batter of immunohistochemical stains were performed and show a mixture of T and B cells but with a significant B cell component mostly composed of large lymphoid cells with positivity for CD79a, PAX-5, CD30, BCL-2, and BCL-6. LCA shows diffuse staining although some of the large lymphoid cells appear negative. No significant staining is seen with CD20, CD10, CD15, CD34, TdT, or CD138. The lack of CD20 expression in this material is possibly related to prior treatment. There is variable increased expression of Ki-67 ranging from 10 to >50% in some areas particularly where there is abundance of large lymphoid cells. There is admixed abundant T cell population in the background as seen with CD3, CD5, and CD43 with on apparent co-expression of CD5 in B cell areas. The overall findings are consistent with involvement by non-Hodgkin's B cell lymphoma and the presence of relative abundance of large lymphoid B-cells and increased Ki-67 expression favor a high grade large B cell lymphoma.   11/23/2017 Surgery   1. CPT 24516-Intramedullary nailing of right humerus fracture 2. CPT 20245-Bone biopsy, deep     11/23/2017 - 11/25/2017 Hospital Admission   She was admitted to the hospital for management of humerus fracture and aggressive lymphoma   11/24/2017 Imaging   ECHO showed LV EF: 65% -   70%   11/25/2017 Procedure   Status post ultrasound-guided biopsy of liver  mass. Tissue specimen sent to pathology for complete histopathologic analysis   12/07/2017 Imaging   Successful placement of a right internal jugular approach power injectable Port-A-Cath. The catheter is ready for immediate use.   12/09/2017 - 03/29/2018 Chemotherapy   The patient had R-CHOP chemo     12/17/2017 - 12/20/2017 Hospital Admission   She was hospitalized for influenza infection   01/03/2018 Procedure   Unsuccessful initial L4-5 LP. Successful L2-3 lumbar puncture with installation of methotrexate.   01/24/2018 Procedure   Non complicated injection of intrathecal methotrexate, as above.   02/09/2018 Imaging   LV EF: 55% -  60%   02/10/2018 PET scan   1. Interval response to therapy. There has been resolution of previous hypermetabolic tumor within the chest and abdomen. Deauville criteria 2 and 3. 2. Diffuse radiotracer uptake is identified within the axial and appendicular skeleton which is favored to represent treatment related changes. 3. Significant decrease in FDG uptake associated with previously noted hypermetabolic tumor involving the proximal humeral diaphysis. The FDG uptake within this area on today's study is equivalent to that which is seen in the lumbar spine. 4. Aortic Atherosclerosis (ICD10-I70.0). Lad coronary artery calcifications. 5. Hiatal hernia   02/15/2018 Procedure   Fluoroscopic guided lumbar puncture and intrathecal injection of chemotherapy without complication.   03/31/2018 Procedure   Technically successful fluoroscopic guided lumbar puncture for intrathecal methotrexate injection. Procedure was well tolerated without evidence for immediate complications. Patient will recover in short stay prior to being discharged home   05/08/2018 PET scan   No residual/persistent enlarged or hypermetabolic lymph nodes in  the neck, chest, abdomen or pelvis. Findings suggest an excellent response to treatment.   05/25/2018 -  Chemotherapy   The patient had Revlimid for  maintenance chemo    06/07/2018 Imaging   LV EF: 55% -  60%   09/25/2018 PET scan   1. Stable exam. No hypermetabolic lymphadenopathy in the neck, chest, abdomen, or pelvis. 2.  Aortic Atherosclerois (ICD10-170.0) 3.  Emphysema. (ICD10-J43.9)    04/09/2019 Imaging   1. No CT findings without contrast to suggest recurrent lymphoma involving the chest, abdomen or pelvis. 2. Stable advanced atherosclerotic calcifications involving the thoracic and abdominal aorta and branch vessels. 3. Moderate to large hiatal hernia. 4. No significant bony findings.   10/08/2019 Imaging   1. No CT findings to suggest recurrent lymphoma. No lymphadenopathy in the lower neck, chest, abdomen, pelvis or inguinal regions. 2. Stable advanced atherosclerotic calcifications involving the thoracic and abdominal aorta and branch vessels. 3. Moderate to large hiatal hernia. 4. Stable sclerotic bone lesions involving the right ischium and right hip. New left total hip arthroplasty noted.       REVIEW OF SYSTEMS:   Constitutional: Denies fevers, chills or abnormal weight loss Eyes: Denies blurriness of vision Ears, nose, mouth, throat, and face: Denies mucositis or sore throat Respiratory: Denies cough, dyspnea or wheezes Cardiovascular: Denies palpitation, chest discomfort or lower extremity swelling Gastrointestinal:  Denies nausea, heartburn or change in bowel habits Skin: Denies abnormal skin rashes Lymphatics: Denies new lymphadenopathy or easy bruising Behavioral/Psych: Mood is stable, no new changes  All other systems were reviewed with the patient and are negative.  I have reviewed the past medical history, past surgical history, social history and family history with the patient and they are unchanged from previous note.  ALLERGIES:  is allergic to contrast media [iodinated diagnostic agents], iohexol, povidone-iodine, penicillins, and adhesive [tape].  MEDICATIONS:  Current Outpatient  Medications  Medication Sig Dispense Refill  . albuterol (PROVENTIL HFA;VENTOLIN HFA) 108 (90 Base) MCG/ACT inhaler Inhale 2 puffs into the lungs every 6 (six) hours as needed for wheezing or shortness of breath. 1 Inhaler 0  . aspirin EC 81 MG tablet Take 81 mg by mouth daily.    . cholecalciferol (VITAMIN D) 1000 units tablet Take 2,000 Units by mouth daily.    . fluticasone furoate-vilanterol (BREO ELLIPTA) 200-25 MCG/INH AEPB Inhale 1 puff into the lungs daily. 60 each 6  . Probiotic Product (PROBIOTIC PO) Take by mouth.    . sertraline (ZOLOFT) 25 MG tablet Take 1 tablet (25 mg total) by mouth daily. 90 tablet 1   No current facility-administered medications for this visit.    PHYSICAL EXAMINATION: ECOG PERFORMANCE STATUS: 1 - Symptomatic but completely ambulatory  Vitals:   03/25/20 0957  BP: 140/70  Pulse: 84  Resp: 18  Temp: 98.1 F (36.7 C)  SpO2: 93%   Filed Weights   03/25/20 0957  Weight: 137 lb (62.1 kg)    GENERAL:alert, no distress and comfortable SKIN: skin color, texture, turgor are normal, no rashes or significant lesions EYES: normal, Conjunctiva are pink and non-injected, sclera clear OROPHARYNX:no exudate, no erythema and lips, buccal mucosa, and tongue normal  NECK: supple, thyroid normal size, non-tender, without nodularity LYMPH:  no palpable lymphadenopathy in the cervical, axillary or inguinal LUNGS: clear to auscultation and percussion with normal breathing effort HEART: regular rate & rhythm and no murmurs and no lower extremity edema ABDOMEN:abdomen soft, non-tender and normal bowel sounds Musculoskeletal:no cyanosis of digits and no clubbing  NEURO: alert & oriented x 3 with fluent speech, no focal motor/sensory deficits  LABORATORY DATA:  I have reviewed the data as listed    Component Value Date/Time   NA 144 03/25/2020 0900   NA 142 12/28/2016 1002   K 4.0 03/25/2020 0900   K 4.6 12/28/2016 1002   CL 108 03/25/2020 0900   CL 101  03/28/2013 0826   CO2 26 03/25/2020 0900   CO2 29 12/28/2016 1002   GLUCOSE 82 03/25/2020 0900   GLUCOSE 85 12/28/2016 1002   GLUCOSE 84 03/28/2013 0826   BUN 17 03/25/2020 0900   BUN 20.3 12/28/2016 1002   CREATININE 0.91 03/25/2020 0900   CREATININE 0.78 05/03/2019 1019   CREATININE 0.9 12/28/2016 1002   CALCIUM 9.8 03/25/2020 0900   CALCIUM 10.1 12/28/2016 1002   PROT 6.5 03/25/2020 0900   PROT 6.8 12/28/2016 1002   ALBUMIN 4.0 03/25/2020 0900   ALBUMIN 4.2 12/28/2016 1002   AST 16 03/25/2020 0900   AST 12 (L) 05/03/2019 1019   AST 20 12/28/2016 1002   ALT 10 03/25/2020 0900   ALT 10 05/03/2019 1019   ALT 12 12/28/2016 1002   ALKPHOS 133 (H) 03/25/2020 0900   ALKPHOS 111 12/28/2016 1002   BILITOT 0.5 03/25/2020 0900   BILITOT 0.3 05/03/2019 1019   BILITOT 0.55 12/28/2016 1002   GFRNONAA >60 03/25/2020 0900   GFRNONAA >60 05/03/2019 1019   GFRAA >60 03/25/2020 0900   GFRAA >60 05/03/2019 1019    No results found for: SPEP, UPEP  Lab Results  Component Value Date   WBC 7.5 03/25/2020   NEUTROABS 4.8 03/25/2020   HGB 13.3 03/25/2020   HCT 39.9 03/25/2020   MCV 103.4 (H) 03/25/2020   PLT 258 03/25/2020      Chemistry      Component Value Date/Time   NA 144 03/25/2020 0900   NA 142 12/28/2016 1002   K 4.0 03/25/2020 0900   K 4.6 12/28/2016 1002   CL 108 03/25/2020 0900   CL 101 03/28/2013 0826   CO2 26 03/25/2020 0900   CO2 29 12/28/2016 1002   BUN 17 03/25/2020 0900   BUN 20.3 12/28/2016 1002   CREATININE 0.91 03/25/2020 0900   CREATININE 0.78 05/03/2019 1019   CREATININE 0.9 12/28/2016 1002      Component Value Date/Time   CALCIUM 9.8 03/25/2020 0900   CALCIUM 10.1 12/28/2016 1002   ALKPHOS 133 (H) 03/25/2020 0900   ALKPHOS 111 12/28/2016 1002   AST 16 03/25/2020 0900   AST 12 (L) 05/03/2019 1019   AST 20 12/28/2016 1002   ALT 10 03/25/2020 0900   ALT 10 05/03/2019 1019   ALT 12 12/28/2016 1002   BILITOT 0.5 03/25/2020 0900   BILITOT 0.3  05/03/2019 1019   BILITOT 0.55 12/28/2016 1002

## 2020-03-25 NOTE — Assessment & Plan Note (Signed)
She has persistent vitamin B12 deficiency since I space out the frequency of the injection I will schedule weekly injection for month and then once a month for several months

## 2020-03-25 NOTE — Assessment & Plan Note (Signed)
She has persistent neuropathy in her feet likely secondary to side effects of chemo and vitamin B12 deficiency

## 2020-03-25 NOTE — Telephone Encounter (Signed)
Scheduled per 6/15 sch message. Pt aware of appts.

## 2020-03-25 NOTE — Telephone Encounter (Signed)
-----   Message from [name], MD sent at 03/25/2020 11:12 AM EDT ----- Regarding: low B12 Her B12 is still low, might explain why neuropathy is still not better Scheduler will call for B12 injections to resume weekly x 4 then monthly I will see her again in 8 weeks

## 2020-03-25 NOTE — Telephone Encounter (Signed)
Called and given below message. She verbalized understanding. 

## 2020-03-27 LAB — COPPER, SERUM: Copper: 134 ug/dL (ref 80–158)

## 2020-04-01 ENCOUNTER — Inpatient Hospital Stay: Payer: Medicare Other

## 2020-04-01 ENCOUNTER — Other Ambulatory Visit: Payer: Self-pay

## 2020-04-01 VITALS — BP 135/64 | HR 91 | Temp 98.3°F | Resp 18

## 2020-04-01 DIAGNOSIS — Z8572 Personal history of non-Hodgkin lymphomas: Secondary | ICD-10-CM | POA: Diagnosis not present

## 2020-04-01 DIAGNOSIS — C8588 Other specified types of non-Hodgkin lymphoma, lymph nodes of multiple sites: Secondary | ICD-10-CM

## 2020-04-01 MED ORDER — CYANOCOBALAMIN 1000 MCG/ML IJ SOLN
1000.0000 ug | Freq: Once | INTRAMUSCULAR | Status: AC
  Administered 2020-04-01: 1000 ug via INTRAMUSCULAR

## 2020-04-01 NOTE — Patient Instructions (Signed)

## 2020-04-08 ENCOUNTER — Inpatient Hospital Stay: Payer: Medicare Other

## 2020-04-08 ENCOUNTER — Other Ambulatory Visit: Payer: Self-pay

## 2020-04-08 VITALS — BP 143/60 | HR 75 | Temp 98.1°F | Resp 18

## 2020-04-08 DIAGNOSIS — C8588 Other specified types of non-Hodgkin lymphoma, lymph nodes of multiple sites: Secondary | ICD-10-CM

## 2020-04-08 DIAGNOSIS — Z8572 Personal history of non-Hodgkin lymphomas: Secondary | ICD-10-CM | POA: Diagnosis not present

## 2020-04-08 MED ORDER — CYANOCOBALAMIN 1000 MCG/ML IJ SOLN
1000.0000 ug | Freq: Once | INTRAMUSCULAR | Status: AC
  Administered 2020-04-08: 1000 ug via INTRAMUSCULAR

## 2020-04-08 MED ORDER — CYANOCOBALAMIN 1000 MCG/ML IJ SOLN
INTRAMUSCULAR | Status: AC
  Filled 2020-04-08: qty 1

## 2020-04-15 ENCOUNTER — Inpatient Hospital Stay: Payer: Medicare Other | Attending: Hematology and Oncology

## 2020-04-15 ENCOUNTER — Other Ambulatory Visit: Payer: Self-pay

## 2020-04-15 VITALS — BP 151/74 | HR 74 | Temp 97.9°F | Resp 18

## 2020-04-15 DIAGNOSIS — E538 Deficiency of other specified B group vitamins: Secondary | ICD-10-CM | POA: Insufficient documentation

## 2020-04-15 DIAGNOSIS — Z8572 Personal history of non-Hodgkin lymphomas: Secondary | ICD-10-CM | POA: Diagnosis not present

## 2020-04-15 DIAGNOSIS — C8588 Other specified types of non-Hodgkin lymphoma, lymph nodes of multiple sites: Secondary | ICD-10-CM

## 2020-04-15 MED ORDER — CYANOCOBALAMIN 1000 MCG/ML IJ SOLN
1000.0000 ug | Freq: Once | INTRAMUSCULAR | Status: AC
  Administered 2020-04-15: 1000 ug via INTRAMUSCULAR

## 2020-04-15 NOTE — Patient Instructions (Signed)

## 2020-04-22 ENCOUNTER — Other Ambulatory Visit: Payer: Self-pay

## 2020-04-22 ENCOUNTER — Inpatient Hospital Stay: Payer: Medicare Other

## 2020-04-22 VITALS — BP 125/74 | HR 86 | Resp 18

## 2020-04-22 DIAGNOSIS — Z8572 Personal history of non-Hodgkin lymphomas: Secondary | ICD-10-CM | POA: Diagnosis not present

## 2020-04-22 DIAGNOSIS — E538 Deficiency of other specified B group vitamins: Secondary | ICD-10-CM | POA: Diagnosis not present

## 2020-04-22 DIAGNOSIS — C8588 Other specified types of non-Hodgkin lymphoma, lymph nodes of multiple sites: Secondary | ICD-10-CM

## 2020-04-22 MED ORDER — CYANOCOBALAMIN 1000 MCG/ML IJ SOLN
INTRAMUSCULAR | Status: AC
  Filled 2020-04-22: qty 1

## 2020-04-22 MED ORDER — CYANOCOBALAMIN 1000 MCG/ML IJ SOLN
1000.0000 ug | Freq: Once | INTRAMUSCULAR | Status: AC
  Administered 2020-04-22: 1000 ug via INTRAMUSCULAR

## 2020-04-22 NOTE — Patient Instructions (Signed)
Cyanocobalamin, Pyridoxine, and Folate What is this medicine? A multivitamin containing folic acid, vitamin B6, and vitamin B12. This medicine may be used for other purposes; ask your health care provider or pharmacist if you have questions. COMMON BRAND NAME(S): AllanFol RX, AllanTex, Av-Vite FB, B Complex with Folic Acid, ComBgen, FaBB, Folamin, Folastin, Folbalin, Folbee, Folbic, Folcaps, Folgard, Folgard RX, Folgard RX 2.2, Folplex, Folplex 2.2, Foltabs 800, Foltx, Homocysteine Formula, Niva-Fol, NuFol, TL Gard RX, Virt-Gard, Virt-Vite, Virt-Vite Forte, Vita-Respa What should I tell my health care provider before I take this medicine? They need to know if you have any of these conditions:  bleeding or clotting disorder  history of anemia of any type  other chronic health condition  an unusual or allergic reaction to vitamins, other medicines, foods, dyes, or preservatives  pregnant or trying to get pregnant  breast-feeding How should I use this medicine? Take by mouth with a glass of water. May take with food. Follow the directions on the prescription label. It is usually given once a day. Do not take your medicine more often than directed. Contact your pediatrician regarding the use of this medicine in children. Special care may be needed. Overdosage: If you think you have taken too much of this medicine contact a poison control center or emergency room at once. NOTE: This medicine is only for you. Do not share this medicine with others. What if I miss a dose? If you miss a dose, take it as soon as you can. If it is almost time for your next dose, take only that dose. Do not take double or extra doses. What may interact with this medicine?  levodopa This list may not describe all possible interactions. Give your health care provider a list of all the medicines, herbs, non-prescription drugs, or dietary supplements you use. Also tell them if you smoke, drink alcohol, or use illegal  drugs. Some items may interact with your medicine. What should I watch for while using this medicine? See your health care professional for regular checks on your progress. Remember that vitamin supplements do not replace the need for good nutrition from a balanced diet. What side effects may I notice from receiving this medicine? Side effects that you should report to your doctor or health care professional as soon as possible:  allergic reaction such as skin rash or difficulty breathing  vomiting Side effects that usually do not require medical attention (report to your doctor or health care professional if they continue or are bothersome):  nausea  stomach upset This list may not describe all possible side effects. Call your doctor for medical advice about side effects. You may report side effects to FDA at 1-800-FDA-1088. Where should I keep my medicine? Keep out of the reach of children. Most vitamins should be stored at controlled room temperature. Check your specific product directions. Protect from heat and moisture. Throw away any unused medicine after the expiration date. NOTE: This sheet is a summary. It may not cover all possible information. If you have questions about this medicine, talk to your doctor, pharmacist, or health care provider.  2020 Elsevier/Gold Standard (2007-11-18 00:59:55)  

## 2020-05-20 ENCOUNTER — Inpatient Hospital Stay: Payer: Medicare Other

## 2020-05-20 ENCOUNTER — Inpatient Hospital Stay: Payer: Medicare Other | Attending: Hematology and Oncology | Admitting: Hematology and Oncology

## 2020-05-20 ENCOUNTER — Telehealth: Payer: Self-pay | Admitting: Hematology and Oncology

## 2020-05-20 ENCOUNTER — Other Ambulatory Visit: Payer: Self-pay

## 2020-05-20 ENCOUNTER — Telehealth: Payer: Self-pay

## 2020-05-20 ENCOUNTER — Encounter: Payer: Self-pay | Admitting: Hematology and Oncology

## 2020-05-20 VITALS — BP 144/60 | HR 92 | Resp 18

## 2020-05-20 DIAGNOSIS — E538 Deficiency of other specified B group vitamins: Secondary | ICD-10-CM | POA: Insufficient documentation

## 2020-05-20 DIAGNOSIS — Z7982 Long term (current) use of aspirin: Secondary | ICD-10-CM | POA: Insufficient documentation

## 2020-05-20 DIAGNOSIS — C8588 Other specified types of non-Hodgkin lymphoma, lymph nodes of multiple sites: Secondary | ICD-10-CM

## 2020-05-20 DIAGNOSIS — Z8572 Personal history of non-Hodgkin lymphomas: Secondary | ICD-10-CM | POA: Diagnosis not present

## 2020-05-20 DIAGNOSIS — I2510 Atherosclerotic heart disease of native coronary artery without angina pectoris: Secondary | ICD-10-CM | POA: Diagnosis not present

## 2020-05-20 DIAGNOSIS — T451X5A Adverse effect of antineoplastic and immunosuppressive drugs, initial encounter: Secondary | ICD-10-CM | POA: Diagnosis not present

## 2020-05-20 DIAGNOSIS — Z79899 Other long term (current) drug therapy: Secondary | ICD-10-CM | POA: Diagnosis not present

## 2020-05-20 DIAGNOSIS — C8338 Diffuse large B-cell lymphoma, lymph nodes of multiple sites: Secondary | ICD-10-CM

## 2020-05-20 DIAGNOSIS — I700 Atherosclerosis of aorta: Secondary | ICD-10-CM | POA: Diagnosis not present

## 2020-05-20 DIAGNOSIS — K449 Diaphragmatic hernia without obstruction or gangrene: Secondary | ICD-10-CM | POA: Diagnosis not present

## 2020-05-20 DIAGNOSIS — J439 Emphysema, unspecified: Secondary | ICD-10-CM | POA: Insufficient documentation

## 2020-05-20 DIAGNOSIS — Z9221 Personal history of antineoplastic chemotherapy: Secondary | ICD-10-CM | POA: Insufficient documentation

## 2020-05-20 DIAGNOSIS — G620 Drug-induced polyneuropathy: Secondary | ICD-10-CM | POA: Insufficient documentation

## 2020-05-20 LAB — CBC WITH DIFFERENTIAL/PLATELET
Abs Immature Granulocytes: 0.08 10*3/uL — ABNORMAL HIGH (ref 0.00–0.07)
Basophils Absolute: 0.1 10*3/uL (ref 0.0–0.1)
Basophils Relative: 1 %
Eosinophils Absolute: 0.5 10*3/uL (ref 0.0–0.5)
Eosinophils Relative: 7 %
HCT: 40.6 % (ref 36.0–46.0)
Hemoglobin: 13.5 g/dL (ref 12.0–15.0)
Immature Granulocytes: 1 %
Lymphocytes Relative: 22 %
Lymphs Abs: 1.5 10*3/uL (ref 0.7–4.0)
MCH: 33.7 pg (ref 26.0–34.0)
MCHC: 33.3 g/dL (ref 30.0–36.0)
MCV: 101.2 fL — ABNORMAL HIGH (ref 80.0–100.0)
Monocytes Absolute: 0.8 10*3/uL (ref 0.1–1.0)
Monocytes Relative: 11 %
Neutro Abs: 4 10*3/uL (ref 1.7–7.7)
Neutrophils Relative %: 58 %
Platelets: 284 10*3/uL (ref 150–400)
RBC: 4.01 MIL/uL (ref 3.87–5.11)
RDW: 13.3 % (ref 11.5–15.5)
WBC: 6.9 10*3/uL (ref 4.0–10.5)
nRBC: 0 % (ref 0.0–0.2)

## 2020-05-20 LAB — COMPREHENSIVE METABOLIC PANEL
ALT: 7 U/L (ref 0–44)
AST: 15 U/L (ref 15–41)
Albumin: 3.9 g/dL (ref 3.5–5.0)
Alkaline Phosphatase: 140 U/L — ABNORMAL HIGH (ref 38–126)
Anion gap: 10 (ref 5–15)
BUN: 15 mg/dL (ref 8–23)
CO2: 25 mmol/L (ref 22–32)
Calcium: 10.2 mg/dL (ref 8.9–10.3)
Chloride: 105 mmol/L (ref 98–111)
Creatinine, Ser: 0.84 mg/dL (ref 0.44–1.00)
GFR calc Af Amer: >60 mL/min (ref >60)
GFR calc non Af Amer: >60 mL/min (ref >60)
Glucose, Bld: 87 mg/dL (ref 70–99)
Potassium: 3.8 mmol/L (ref 3.5–5.1)
Sodium: 140 mmol/L (ref 135–145)
Total Bilirubin: 0.4 mg/dL (ref 0.3–1.2)
Total Protein: 6.7 g/dL (ref 6.5–8.1)

## 2020-05-20 MED ORDER — HEPARIN SOD (PORK) LOCK FLUSH 100 UNIT/ML IV SOLN
500.0000 [IU] | Freq: Once | INTRAVENOUS | Status: AC
  Administered 2020-05-20: 500 [IU]
  Filled 2020-05-20: qty 5

## 2020-05-20 MED ORDER — CYANOCOBALAMIN 1000 MCG/ML IJ SOLN
1000.0000 ug | Freq: Once | INTRAMUSCULAR | Status: AC
  Administered 2020-05-20: 1000 ug via INTRAMUSCULAR

## 2020-05-20 MED ORDER — CYANOCOBALAMIN 1000 MCG/ML IJ SOLN
INTRAMUSCULAR | Status: AC
  Filled 2020-05-20: qty 1

## 2020-05-20 MED ORDER — SODIUM CHLORIDE 0.9% FLUSH
10.0000 mL | Freq: Once | INTRAVENOUS | Status: AC
  Administered 2020-05-20: 10 mL
  Filled 2020-05-20: qty 10

## 2020-05-20 NOTE — Assessment & Plan Note (Signed)
She has persistent neuropathy in her feet likely secondary to side effects of chemo and vitamin B12 deficiency Observe for now 

## 2020-05-20 NOTE — Assessment & Plan Note (Signed)
She has persistent vitamin B12 deficiency since I space out the frequency of the injection I will schedule monthly injection for several more months I plan to recheck her vitamin B12 level in October

## 2020-05-20 NOTE — Telephone Encounter (Signed)
-----   Message from [name], MD sent at 05/20/2020 10:31 AM EDT ----- Regarding: call her/husband. her labs are normal

## 2020-05-20 NOTE — Telephone Encounter (Signed)
Called and given below message. She verbalized understanding. 

## 2020-05-20 NOTE — Assessment & Plan Note (Signed)
Clinically, she has no signs of cancer recurrence Her last CT scan in Dec 2020 was unremarkable She is still at high risk of cancer recurrence I will continue port maintenance with blood work, physical examination every 8 weeks and I will see her in 16 weeks For now, I do not feel strongly she needs routine surveillance imaging study

## 2020-05-20 NOTE — Telephone Encounter (Signed)
Scheduled appts per 8/10 sch msg. Gave pt a print out of AVS.

## 2020-05-20 NOTE — Progress Notes (Signed)
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[facility]) Clinically, she has no signs of cancer recurrence Her last CT scan in Dec 2020 was unremarkable She is still at high risk of cancer recurrence I will continue port maintenance with blood work, physical examination every 8 weeks and I will see her in 16 weeks For now, I do not feel strongly she needs routine surveillance imaging study  Vitamin B12 deficiency She has persistent vitamin B12 deficiency since I space out the frequency of the injection I will schedule monthly injection for several more months I plan to recheck her vitamin B12 level in October  Peripheral neuropathy due to chemotherapy [facility]) She has persistent neuropathy in her feet likely secondary to side effects of chemo and vitamin B12 deficiency Observe for now   Orders Placed This Encounter  Procedures  . Vitamin B12    Standing Status:   Future    Standing Expiration Date:   05/20/2021    All questions were answered. The patient knows to call the clinic with any problems, questions or concerns. The total time spent in the appointment was 20 minutes encounter with patients including review of chart and various tests results, discussions about plan of care and coordination of care plan   [name], MD 05/20/2020 11:31 AM  INTERVAL HISTORY: Please see below for problem oriented charting. 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 She was treated with chlorambucil in 2000.   --2007 - 09/2008.  Treated with single agent Rituxan.   --12/2008.  She had progressive adenopathy in chest and abdomen and repeat biopsy showed low grade follicular and diffuse B cell NHL.   --01/2009 - 07/2009. She required left ureteral stent and was treated with Treanda/Rituxan x 5 cycles from 02-2009 thru 06-2009.    09/16/2014 Imaging   No lymphadenopathy in the chest, abdomen, or pelvis. The small right axillary lymph nodes seen on the previous exam are unchanged in the 18 month interval. Small to moderate hiatal hernia.   11/20/2017 Imaging   Mildly displaced and angulated pathologic fracture of the proximal humeral shaft. 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    11/22/2017 PET scan   New hypermetabolic lymphadenopathy within the chest and abdomen, consistent with recurrent lymphoma. New large hypermetabolic mass in the right hepatic lobe, consistent with lymphoma this involvement. New small hypermetabolic mass in the left kidney, which may be due to lymphomatous involvement although primary renal cell carcinoma cannot be excluded. Hypermetabolic mass in the right humerus with pathologic fracture, consistent with lymphomatous involvement. Small hypermetabolic foci in right posterior chest wall, also suspicious for recurrent lymphoma.   11/23/2017 Procedure   Bone, fragment(s), Right Humeral Shaft Reamings - ATYPICAL LYMPHOID INFILTRATE CONSISTENT WITH NON-HODGKIN'S B CELL LYMPHOMA. - SEE ONCOLOGY TABLE. Microscopic Comment LYMPHOMA Histologic type: Non-Hodgkins lymphoma, favor large  cell type. Grade (if applicable): Favor high grade. Flow cytometry: No specimen is available for analysis. Immunohistochemical stains: BCL-2, BCL-6, CD3,  CD5, CD10, CD15, CD20, CD30, CD34, CD43, LCA, CD79a, CD138, Ki-67, PAX-5 and TdT with appropriate controls as performed on block 1C. Touch preps/imprints: Not performed. Comments: The sections show multiple, primarily soft tissue fragments displaying a dense lymphoid infiltrate characterized by a mixture of small round to slightly irregular lymphocytes, histiocytes, and large centroblastic appearing lymphoid cells with partially clumped to vesicular chromatin, small nucleoli and amphophilic to clear cytoplasm. The number of admixed large lymphoid cells is variable but they appear relatively abundant in some areas associated with clustering. No material is available for flow cytometric analysis. Hence, a large batter of immunohistochemical stains were performed and show a mixture of T and B cells but with a significant B cell component mostly composed of large lymphoid cells with positivity for CD79a, PAX-5, CD30, BCL-2, and BCL-6. LCA shows diffuse staining although some of the large lymphoid cells appear negative. No significant staining is seen with CD20, CD10, CD15, CD34, TdT, or CD138. The lack of CD20 expression in this material is possibly related to prior treatment. There is variable increased expression of Ki-67 ranging from 10 to >50% in some areas particularly where there is abundance of large lymphoid cells. There is admixed abundant T cell population in the background as seen with CD3, CD5, and CD43 with on apparent co-expression of CD5 in B cell areas. The overall findings are consistent with involvement by non-Hodgkin's B cell lymphoma and the presence of relative abundance of large lymphoid B-cells and increased Ki-67 expression favor a high grade large B cell lymphoma.   11/23/2017 Surgery   1. CPT 24516-Intramedullary nailing of right humerus fracture 2. CPT 20245-Bone biopsy, deep     11/23/2017 - 11/25/2017 Hospital Admission   She was admitted to the hospital for management of humerus  fracture and aggressive lymphoma   11/24/2017 Imaging   ECHO showed LV EF: 65% -   70%   11/25/2017 Procedure   Status post ultrasound-guided biopsy of liver mass. Tissue specimen sent to pathology for complete histopathologic analysis   12/07/2017 Imaging   Successful placement of a right internal jugular approach power injectable Port-A-Cath. The catheter is ready for immediate use.   12/09/2017 - 03/29/2018 Chemotherapy   The patient had R-CHOP chemo     12/17/2017 - 12/20/2017 Hospital Admission   She was hospitalized for influenza infection   01/03/2018 Procedure   Unsuccessful initial L4-5 LP. Successful L2-3 lumbar puncture with installation of methotrexate.   01/24/2018 Procedure   Non complicated injection of intrathecal methotrexate, as above.   02/09/2018 Imaging   LV EF: 55% -  60%   02/10/2018 PET scan   1. Interval response to therapy. There has been resolution of previous hypermetabolic tumor within the chest and abdomen. Deauville criteria 2 and 3. 2. Diffuse radiotracer uptake is identified within the axial and appendicular skeleton which is favored to represent treatment related changes. 3. Significant decrease in FDG uptake associated with previously noted hypermetabolic tumor involving the proximal humeral diaphysis. The FDG uptake within this area on today's study is equivalent to that which is seen in the lumbar spine. 4. Aortic Atherosclerosis (ICD10-I70.0). Lad coronary artery calcifications. 5. Hiatal hernia   02/15/2018 Procedure   Fluoroscopic guided lumbar puncture and intrathecal injection of chemotherapy without complication.   03/31/2018 Procedure   Technically successful fluoroscopic guided lumbar puncture for intrathecal methotrexate injection. Procedure was well  tolerated without evidence for immediate complications. Patient will recover in short stay prior to being discharged home   05/08/2018 PET scan   No residual/persistent enlarged or hypermetabolic lymph  nodes in the neck, chest, abdomen or pelvis. Findings suggest an excellent response to treatment.   05/25/2018 -  Chemotherapy   The patient had Revlimid for maintenance chemo    06/07/2018 Imaging   LV EF: 55% -  60%   09/25/2018 PET scan   1. Stable exam. No hypermetabolic lymphadenopathy in the neck, chest, abdomen, or pelvis. 2.  Aortic Atherosclerois (ICD10-170.0) 3.  Emphysema. (ICD10-J43.9)    04/09/2019 Imaging   1. No CT findings without contrast to suggest recurrent lymphoma involving the chest, abdomen or pelvis. 2. Stable advanced atherosclerotic calcifications involving the thoracic and abdominal aorta and branch vessels. 3. Moderate to large hiatal hernia. 4. No significant bony findings.   10/08/2019 Imaging   1. No CT findings to suggest recurrent lymphoma. No lymphadenopathy in the lower neck, chest, abdomen, pelvis or inguinal regions. 2. Stable advanced atherosclerotic calcifications involving the thoracic and abdominal aorta and branch vessels. 3. Moderate to large hiatal hernia. 4. Stable sclerotic bone lesions involving the right ischium and right hip. New left total hip arthroplasty noted.       REVIEW OF SYSTEMS:   Constitutional: Denies fevers, chills or abnormal weight loss Eyes: Denies blurriness of vision Ears, nose, mouth, throat, and face: Denies mucositis or sore throat Respiratory: Denies cough, dyspnea or wheezes Cardiovascular: Denies palpitation, chest discomfort or lower extremity swelling Gastrointestinal:  Denies nausea, heartburn or change in bowel habits Skin: Denies abnormal skin rashes Lymphatics: Denies new lymphadenopathy or easy bruising Behavioral/Psych: Mood is stable, no new changes  All other systems were reviewed with the patient and are negative.  I have reviewed the past medical history, past surgical history, social history and family history with the patient and they are unchanged from previous note.  ALLERGIES:  is  allergic to contrast media [iodinated diagnostic agents], iohexol, povidone-iodine, penicillins, and adhesive [tape].  MEDICATIONS:  Current Outpatient Medications  Medication Sig Dispense Refill  . albuterol (PROVENTIL HFA;VENTOLIN HFA) 108 (90 Base) MCG/ACT inhaler Inhale 2 puffs into the lungs every 6 (six) hours as needed for wheezing or shortness of breath. 1 Inhaler 0  . aspirin EC 81 MG tablet Take 81 mg by mouth daily.    . cholecalciferol (VITAMIN D) 1000 units tablet Take 2,000 Units by mouth daily.    . fluticasone furoate-vilanterol (BREO ELLIPTA) 200-25 MCG/INH AEPB Inhale 1 puff into the lungs daily. 60 each 6  . Probiotic Product (PROBIOTIC PO) Take by mouth.    . sertraline (ZOLOFT) 25 MG tablet Take 1 tablet (25 mg total) by mouth daily. 90 tablet 1   No current facility-administered medications for this visit.    PHYSICAL EXAMINATION: ECOG PERFORMANCE STATUS: 1 - Symptomatic but completely ambulatory  Vitals:   05/20/20 0952  BP: (!) 136/49  Pulse: 70  Resp: 18  Temp: 98.2 F (36.8 C)  SpO2: 96%   Filed Weights   05/20/20 0952  Weight: 135 lb 6.4 oz (61.4 kg)    GENERAL:alert, no distress and comfortable SKIN: skin color, texture, turgor are normal, no rashes or significant lesions EYES: normal, Conjunctiva are pink and non-injected, sclera clear OROPHARYNX:no exudate, no erythema and lips, buccal mucosa, and tongue normal  NECK: supple, thyroid normal size, non-tender, without nodularity LYMPH:  no palpable lymphadenopathy in the cervical, axillary or inguinal LUNGS: clear to  auscultation and percussion with normal breathing effort HEART: regular rate & rhythm and no murmurs and no lower extremity edema ABDOMEN:abdomen soft, non-tender and normal bowel sounds Musculoskeletal:no cyanosis of digits and no clubbing  NEURO: alert & oriented x 3 with fluent speech, no focal motor/sensory deficits  LABORATORY DATA:  I have reviewed the data as listed     Component Value Date/Time   NA 140 05/20/2020 0928   NA 142 12/28/2016 1002   K 3.8 05/20/2020 0928   K 4.6 12/28/2016 1002   CL 105 05/20/2020 0928   CL 101 03/28/2013 0826   CO2 25 05/20/2020 0928   CO2 29 12/28/2016 1002   GLUCOSE 87 05/20/2020 0928   GLUCOSE 85 12/28/2016 1002   GLUCOSE 84 03/28/2013 0826   BUN 15 05/20/2020 0928   BUN 20.3 12/28/2016 1002   CREATININE 0.84 05/20/2020 0928   CREATININE 0.78 05/03/2019 1019   CREATININE 0.9 12/28/2016 1002   CALCIUM 10.2 05/20/2020 0928   CALCIUM 10.1 12/28/2016 1002   PROT 6.7 05/20/2020 0928   PROT 6.8 12/28/2016 1002   ALBUMIN 3.9 05/20/2020 0928   ALBUMIN 4.2 12/28/2016 1002   AST 15 05/20/2020 0928   AST 12 (L) 05/03/2019 1019   AST 20 12/28/2016 1002   ALT 7 05/20/2020 0928   ALT 10 05/03/2019 1019   ALT 12 12/28/2016 1002   ALKPHOS 140 (H) 05/20/2020 0928   ALKPHOS 111 12/28/2016 1002   BILITOT 0.4 05/20/2020 0928   BILITOT 0.3 05/03/2019 1019   BILITOT 0.55 12/28/2016 1002   GFRNONAA >60 05/20/2020 0928   GFRNONAA >60 05/03/2019 1019   GFRAA >60 05/20/2020 0928   GFRAA >60 05/03/2019 1019    No results found for: SPEP, UPEP  Lab Results  Component Value Date   WBC 6.9 05/20/2020   NEUTROABS 4.0 05/20/2020   HGB 13.5 05/20/2020   HCT 40.6 05/20/2020   MCV 101.2 (H) 05/20/2020   PLT 284 05/20/2020      Chemistry      Component Value Date/Time   NA 140 05/20/2020 0928   NA 142 12/28/2016 1002   K 3.8 05/20/2020 0928   K 4.6 12/28/2016 1002   CL 105 05/20/2020 0928   CL 101 03/28/2013 0826   CO2 25 05/20/2020 0928   CO2 29 12/28/2016 1002   BUN 15 05/20/2020 0928   BUN 20.3 12/28/2016 1002   CREATININE 0.84 05/20/2020 0928   CREATININE 0.78 05/03/2019 1019   CREATININE 0.9 12/28/2016 1002      Component Value Date/Time   CALCIUM 10.2 05/20/2020 0928   CALCIUM 10.1 12/28/2016 1002   ALKPHOS 140 (H) 05/20/2020 0928   ALKPHOS 111 12/28/2016 1002   AST 15 05/20/2020 0928   AST 12 (L)  05/03/2019 1019   AST 20 12/28/2016 1002   ALT 7 05/20/2020 0928   ALT 10 05/03/2019 1019   ALT 12 12/28/2016 1002   BILITOT 0.4 05/20/2020 0928   BILITOT 0.3 05/03/2019 1019   BILITOT 0.55 12/28/2016 1002

## 2020-06-09 ENCOUNTER — Encounter: Payer: Self-pay | Admitting: Hematology and Oncology

## 2020-06-11 ENCOUNTER — Telehealth: Payer: Self-pay | Admitting: Hematology and Oncology

## 2020-06-11 NOTE — Telephone Encounter (Signed)
Scheduled appt per 9/1 sch message - left message for pt with appt date and time

## 2020-06-17 ENCOUNTER — Inpatient Hospital Stay: Payer: Medicare Other | Attending: Hematology and Oncology

## 2020-06-17 ENCOUNTER — Ambulatory Visit: Payer: Medicare Other

## 2020-06-17 ENCOUNTER — Other Ambulatory Visit: Payer: Self-pay

## 2020-06-17 VITALS — BP 153/69 | HR 77 | Temp 98.4°F | Resp 18

## 2020-06-17 DIAGNOSIS — E538 Deficiency of other specified B group vitamins: Secondary | ICD-10-CM | POA: Insufficient documentation

## 2020-06-17 DIAGNOSIS — C8588 Other specified types of non-Hodgkin lymphoma, lymph nodes of multiple sites: Secondary | ICD-10-CM

## 2020-06-17 MED ORDER — CYANOCOBALAMIN 1000 MCG/ML IJ SOLN
INTRAMUSCULAR | Status: AC
  Filled 2020-06-17: qty 1

## 2020-06-17 MED ORDER — CYANOCOBALAMIN 1000 MCG/ML IJ SOLN
1000.0000 ug | Freq: Once | INTRAMUSCULAR | Status: AC
  Administered 2020-06-17: 1000 ug via INTRAMUSCULAR

## 2020-06-17 NOTE — Patient Instructions (Signed)
Cyanocobalamin, Pyridoxine, and Folate What is this medicine? A multivitamin containing folic acid, vitamin B6, and vitamin B12. This medicine may be used for other purposes; ask your health care provider or pharmacist if you have questions. COMMON BRAND NAME(S): AllanFol RX, AllanTex, Av-Vite FB, B Complex with Folic Acid, ComBgen, FaBB, Folamin, Folastin, Folbalin, Folbee, Folbic, Folcaps, Folgard, Folgard RX, Folgard RX 2.2, Folplex, Folplex 2.2, Foltabs 800, Foltx, Homocysteine Formula, Niva-Fol, NuFol, TL Gard RX, Virt-Gard, Virt-Vite, Virt-Vite Forte, Vita-Respa What should I tell my health care provider before I take this medicine? They need to know if you have any of these conditions:  bleeding or clotting disorder  history of anemia of any type  other chronic health condition  an unusual or allergic reaction to vitamins, other medicines, foods, dyes, or preservatives  pregnant or trying to get pregnant  breast-feeding How should I use this medicine? Take by mouth with a glass of water. May take with food. Follow the directions on the prescription label. It is usually given once a day. Do not take your medicine more often than directed. Contact your pediatrician regarding the use of this medicine in children. Special care may be needed. Overdosage: If you think you have taken too much of this medicine contact a poison control center or emergency room at once. NOTE: This medicine is only for you. Do not share this medicine with others. What if I miss a dose? If you miss a dose, take it as soon as you can. If it is almost time for your next dose, take only that dose. Do not take double or extra doses. What may interact with this medicine?  levodopa This list may not describe all possible interactions. Give your health care provider a list of all the medicines, herbs, non-prescription drugs, or dietary supplements you use. Also tell them if you smoke, drink alcohol, or use illegal  drugs. Some items may interact with your medicine. What should I watch for while using this medicine? See your health care professional for regular checks on your progress. Remember that vitamin supplements do not replace the need for good nutrition from a balanced diet. What side effects may I notice from receiving this medicine? Side effects that you should report to your doctor or health care professional as soon as possible:  allergic reaction such as skin rash or difficulty breathing  vomiting Side effects that usually do not require medical attention (report to your doctor or health care professional if they continue or are bothersome):  nausea  stomach upset This list may not describe all possible side effects. Call your doctor for medical advice about side effects. You may report side effects to FDA at 1-800-FDA-1088. Where should I keep my medicine? Keep out of the reach of children. Most vitamins should be stored at controlled room temperature. Check your specific product directions. Protect from heat and moisture. Throw away any unused medicine after the expiration date. NOTE: This sheet is a summary. It may not cover all possible information. If you have questions about this medicine, talk to your doctor, pharmacist, or health care provider.  2020 Elsevier/Gold Standard (2007-11-18 00:59:55)  

## 2020-07-15 ENCOUNTER — Inpatient Hospital Stay: Payer: Medicare Other

## 2020-07-15 ENCOUNTER — Inpatient Hospital Stay: Payer: Medicare Other | Attending: Hematology and Oncology

## 2020-07-15 ENCOUNTER — Other Ambulatory Visit: Payer: Self-pay

## 2020-07-15 VITALS — BP 146/67 | HR 78 | Temp 98.0°F | Resp 18

## 2020-07-15 DIAGNOSIS — C8588 Other specified types of non-Hodgkin lymphoma, lymph nodes of multiple sites: Secondary | ICD-10-CM

## 2020-07-15 DIAGNOSIS — Z452 Encounter for adjustment and management of vascular access device: Secondary | ICD-10-CM | POA: Diagnosis not present

## 2020-07-15 DIAGNOSIS — C8338 Diffuse large B-cell lymphoma, lymph nodes of multiple sites: Secondary | ICD-10-CM

## 2020-07-15 DIAGNOSIS — E538 Deficiency of other specified B group vitamins: Secondary | ICD-10-CM | POA: Diagnosis not present

## 2020-07-15 DIAGNOSIS — Z8572 Personal history of non-Hodgkin lymphomas: Secondary | ICD-10-CM | POA: Insufficient documentation

## 2020-07-15 LAB — CBC WITH DIFFERENTIAL/PLATELET
Abs Immature Granulocytes: 0.03 10*3/uL (ref 0.00–0.07)
Basophils Absolute: 0.1 10*3/uL (ref 0.0–0.1)
Basophils Relative: 1 %
Eosinophils Absolute: 0.6 10*3/uL — ABNORMAL HIGH (ref 0.0–0.5)
Eosinophils Relative: 8 %
HCT: 41 % (ref 36.0–46.0)
Hemoglobin: 13.5 g/dL (ref 12.0–15.0)
Immature Granulocytes: 0 %
Lymphocytes Relative: 20 %
Lymphs Abs: 1.4 10*3/uL (ref 0.7–4.0)
MCH: 33.3 pg (ref 26.0–34.0)
MCHC: 32.9 g/dL (ref 30.0–36.0)
MCV: 101.2 fL — ABNORMAL HIGH (ref 80.0–100.0)
Monocytes Absolute: 0.7 10*3/uL (ref 0.1–1.0)
Monocytes Relative: 10 %
Neutro Abs: 4.3 10*3/uL (ref 1.7–7.7)
Neutrophils Relative %: 61 %
Platelets: 268 10*3/uL (ref 150–400)
RBC: 4.05 MIL/uL (ref 3.87–5.11)
RDW: 13.5 % (ref 11.5–15.5)
WBC: 7.1 10*3/uL (ref 4.0–10.5)
nRBC: 0 % (ref 0.0–0.2)

## 2020-07-15 LAB — COMPREHENSIVE METABOLIC PANEL
ALT: 13 U/L (ref 0–44)
AST: 18 U/L (ref 15–41)
Albumin: 4 g/dL (ref 3.5–5.0)
Alkaline Phosphatase: 141 U/L — ABNORMAL HIGH (ref 38–126)
Anion gap: 8 (ref 5–15)
BUN: 14 mg/dL (ref 8–23)
CO2: 27 mmol/L (ref 22–32)
Calcium: 9.8 mg/dL (ref 8.9–10.3)
Chloride: 106 mmol/L (ref 98–111)
Creatinine, Ser: 0.82 mg/dL (ref 0.44–1.00)
GFR calc non Af Amer: >60 mL/min (ref >60)
Glucose, Bld: 84 mg/dL (ref 70–99)
Potassium: 3.9 mmol/L (ref 3.5–5.1)
Sodium: 141 mmol/L (ref 135–145)
Total Bilirubin: 0.5 mg/dL (ref 0.3–1.2)
Total Protein: 6.8 g/dL (ref 6.5–8.1)

## 2020-07-15 LAB — VITAMIN B12: Vitamin B-12: 366 pg/mL (ref 180–914)

## 2020-07-15 MED ORDER — SODIUM CHLORIDE 0.9% FLUSH
10.0000 mL | Freq: Once | INTRAVENOUS | Status: AC
  Administered 2020-07-15: 10 mL
  Filled 2020-07-15: qty 10

## 2020-07-15 MED ORDER — HEPARIN SOD (PORK) LOCK FLUSH 100 UNIT/ML IV SOLN
500.0000 [IU] | Freq: Once | INTRAVENOUS | Status: AC
  Administered 2020-07-15: 500 [IU]
  Filled 2020-07-15: qty 5

## 2020-07-15 MED ORDER — CYANOCOBALAMIN 1000 MCG/ML IJ SOLN
1000.0000 ug | Freq: Once | INTRAMUSCULAR | Status: AC
  Administered 2020-07-15: 1000 ug via INTRAMUSCULAR

## 2020-07-15 MED ORDER — CYANOCOBALAMIN 1000 MCG/ML IJ SOLN
INTRAMUSCULAR | Status: AC
  Filled 2020-07-15: qty 1

## 2020-08-08 ENCOUNTER — Encounter: Payer: Self-pay | Admitting: Hematology and Oncology

## 2020-08-12 ENCOUNTER — Inpatient Hospital Stay: Payer: Medicare Other | Attending: Hematology and Oncology

## 2020-08-12 ENCOUNTER — Other Ambulatory Visit: Payer: Self-pay

## 2020-08-12 DIAGNOSIS — E538 Deficiency of other specified B group vitamins: Secondary | ICD-10-CM | POA: Insufficient documentation

## 2020-08-12 DIAGNOSIS — Z08 Encounter for follow-up examination after completed treatment for malignant neoplasm: Secondary | ICD-10-CM | POA: Diagnosis not present

## 2020-08-12 DIAGNOSIS — Z7982 Long term (current) use of aspirin: Secondary | ICD-10-CM | POA: Insufficient documentation

## 2020-08-12 DIAGNOSIS — C8588 Other specified types of non-Hodgkin lymphoma, lymph nodes of multiple sites: Secondary | ICD-10-CM

## 2020-08-12 DIAGNOSIS — Z23 Encounter for immunization: Secondary | ICD-10-CM | POA: Insufficient documentation

## 2020-08-12 DIAGNOSIS — G620 Drug-induced polyneuropathy: Secondary | ICD-10-CM | POA: Insufficient documentation

## 2020-08-12 DIAGNOSIS — Z79899 Other long term (current) drug therapy: Secondary | ICD-10-CM | POA: Insufficient documentation

## 2020-08-12 DIAGNOSIS — Z9221 Personal history of antineoplastic chemotherapy: Secondary | ICD-10-CM | POA: Diagnosis not present

## 2020-08-12 DIAGNOSIS — Z8572 Personal history of non-Hodgkin lymphomas: Secondary | ICD-10-CM | POA: Diagnosis not present

## 2020-08-12 MED ORDER — INFLUENZA VAC A&B SA ADJ QUAD 0.5 ML IM PRSY
0.5000 mL | PREFILLED_SYRINGE | Freq: Once | INTRAMUSCULAR | Status: AC
  Administered 2020-08-12: 0.5 mL via INTRAMUSCULAR

## 2020-08-12 MED ORDER — CYANOCOBALAMIN 1000 MCG/ML IJ SOLN
1000.0000 ug | Freq: Once | INTRAMUSCULAR | Status: AC
  Administered 2020-08-12: 1000 ug via INTRAMUSCULAR

## 2020-08-12 MED ORDER — CYANOCOBALAMIN 1000 MCG/ML IJ SOLN
INTRAMUSCULAR | Status: AC
  Filled 2020-08-12: qty 1

## 2020-08-12 MED ORDER — INFLUENZA VAC A&B SA ADJ QUAD 0.5 ML IM PRSY
PREFILLED_SYRINGE | INTRAMUSCULAR | Status: AC
  Filled 2020-08-12: qty 0.5

## 2020-08-12 NOTE — Patient Instructions (Addendum)
Cyanocobalamin, Pyridoxine, and Folate What is this medicine? A multivitamin containing folic acid, vitamin B6, and vitamin B12. This medicine may be used for other purposes; ask your health care provider or pharmacist if you have questions. COMMON BRAND NAME(S): AllanFol RX, AllanTex, Av-Vite FB, B Complex with Folic Acid, ComBgen, FaBB, Folamin, Folastin, [place], [place], [employer], [place], [place], [employer], [place] RX 2.2, [place], [place] 2.2, Foltabs 800, Foltx, Homocysteine Formula, Niva-Fol, NuFol, [employer], Virt-Gard, Virt-Vite, Virt-Vite [place], Vita-Respa What should I tell my health care provider before I take this medicine? They need to know if you have any of these conditions:  bleeding or clotting disorder  history of anemia of any type  other chronic health condition  an unusual or allergic reaction to vitamins, other medicines, foods, dyes, or preservatives  pregnant or trying to get pregnant  breast-feeding How should I use this medicine? Take by mouth with a glass of water. May take with food. Follow the directions on the prescription label. It is usually given once a day. Do not take your medicine more often than directed. Contact your pediatrician regarding the use of this medicine in children. Special care may be needed. Overdosage: If you think you have taken too much of this medicine contact a poison control center or emergency room at once. NOTE: This medicine is only for you. Do not share this medicine with others. What if I miss a dose? If you miss a dose, take it as soon as you can. If it is almost time for your next dose, take only that dose. Do not take double or extra doses. What may interact with this medicine?  levodopa This list may not describe all possible interactions. Give your health care provider a list of all the medicines, herbs, non-prescription drugs, or dietary supplements you use. Also tell them if you smoke, drink alcohol, or use illegal  drugs. Some items may interact with your medicine. What should I watch for while using this medicine? See your health care professional for regular checks on your progress. Remember that vitamin supplements do not replace the need for good nutrition from a balanced diet. What side effects may I notice from receiving this medicine? Side effects that you should report to your doctor or health care professional as soon as possible:  allergic reaction such as skin rash or difficulty breathing  vomiting Side effects that usually do not require medical attention (report to your doctor or health care professional if they continue or are bothersome):  nausea  stomach upset This list may not describe all possible side effects. Call your doctor for medical advice about side effects. You may report side effects to FDA at 1-800-FDA-1088. Where should I keep my medicine? Keep out of the reach of children. Most vitamins should be stored at controlled room temperature. Check your specific product directions. Protect from heat and moisture. Throw away any unused medicine after the expiration date. NOTE: This sheet is a summary. It may not cover all possible information. If you have questions about this medicine, talk to your doctor, pharmacist, or health care provider.  2020 Elsevier/Gold Standard (2007-11-18 00:59:55) Influenza Virus Vaccine injection What is this medicine? INFLUENZA VIRUS VACCINE (in floo EN zuh VAHY ruhs vak SEEN) helps to reduce the risk of getting influenza also known as the flu. The vaccine only helps protect you against some strains of the flu. This medicine may be used for other purposes; ask your health care provider or pharmacist if you have  questions. COMMON BRAND NAME(S): Afluria, Afluria Quadrivalent, Agriflu, Alfuria, FLUAD, Fluarix, Fluarix Quadrivalent, Flublok, Flublok Quadrivalent, FLUCELVAX, FLUCELVAX Quadrivalent, Flulaval, Flulaval Quadrivalent, Fluvirin, Fluzone,  Fluzone High-Dose, Fluzone Intradermal, Fluzone Quadrivalent What should I tell my health care provider before I take this medicine? They need to know if you have any of these conditions:  bleeding disorder like hemophilia  fever or infection  Guillain-Barre syndrome or other neurological problems  immune system problems  infection with the human immunodeficiency virus (HIV) or AIDS  low blood platelet counts  multiple sclerosis  an unusual or allergic reaction to influenza virus vaccine, latex, other medicines, foods, dyes, or preservatives. Different brands of vaccines contain different allergens. Some may contain latex or eggs. Talk to your doctor about your allergies to make sure that you get the right vaccine.  pregnant or trying to get pregnant  breast-feeding How should I use this medicine? This vaccine is for injection into a muscle or under the skin. It is given by a health care professional. A copy of Vaccine Information Statements will be given before each vaccination. Read this sheet carefully each time. The sheet may change frequently. Talk to your healthcare provider to see which vaccines are right for you. Some vaccines should not be used in all age groups. Overdosage: If you think you have taken too much of this medicine contact a poison control center or emergency room at once. NOTE: This medicine is only for you. Do not share this medicine with others. What if I miss a dose? This does not apply. What may interact with this medicine?  chemotherapy or radiation therapy  medicines that lower your immune system like etanercept, anakinra, infliximab, and adalimumab  medicines that treat or prevent blood clots like warfarin  phenytoin  steroid medicines like prednisone or cortisone  theophylline  vaccines This list may not describe all possible interactions. Give your health care provider a list of all the medicines, herbs, non-prescription drugs, or dietary  supplements you use. Also tell them if you smoke, drink alcohol, or use illegal drugs. Some items may interact with your medicine. What should I watch for while using this medicine? Report any side effects that do not go away within 3 days to your doctor or health care professional. Call your health care provider if any unusual symptoms occur within 6 weeks of receiving this vaccine. You may still catch the flu, but the illness is not usually as bad. You cannot get the flu from the vaccine. The vaccine will not protect against colds or other illnesses that may cause fever. The vaccine is needed every year. What side effects may I notice from receiving this medicine? Side effects that you should report to your doctor or health care professional as soon as possible:  allergic reactions like skin rash, itching or hives, swelling of the face, lips, or tongue Side effects that usually do not require medical attention (report to your doctor or health care professional if they continue or are bothersome):  fever  headache  muscle aches and pains  pain, tenderness, redness, or swelling at the injection site  tiredness This list may not describe all possible side effects. Call your doctor for medical advice about side effects. You may report side effects to FDA at 1-800-FDA-1088. Where should I keep my medicine? The vaccine will be given by a health care professional in a clinic, pharmacy, doctor's office, or other health care setting. You will not be given vaccine doses to store at home. NOTE:  This sheet is a summary. It may not cover all possible information. If you have questions about this medicine, talk to your doctor, pharmacist, or health care provider.  2020 Elsevier/Gold Standard (2018-08-22 08:45:43)

## 2020-08-19 ENCOUNTER — Ambulatory Visit (INDEPENDENT_AMBULATORY_CARE_PROVIDER_SITE_OTHER): Payer: Medicare Other | Admitting: Adult Health

## 2020-08-19 ENCOUNTER — Encounter: Payer: Self-pay | Admitting: Adult Health

## 2020-08-19 ENCOUNTER — Other Ambulatory Visit: Payer: Self-pay

## 2020-08-19 VITALS — BP 110/80 | HR 89 | Temp 97.9°F | Ht 63.0 in | Wt 131.6 lb

## 2020-08-19 DIAGNOSIS — E785 Hyperlipidemia, unspecified: Secondary | ICD-10-CM | POA: Diagnosis not present

## 2020-08-19 DIAGNOSIS — J449 Chronic obstructive pulmonary disease, unspecified: Secondary | ICD-10-CM | POA: Diagnosis not present

## 2020-08-19 DIAGNOSIS — F32A Depression, unspecified: Secondary | ICD-10-CM

## 2020-08-19 DIAGNOSIS — E538 Deficiency of other specified B group vitamins: Secondary | ICD-10-CM | POA: Diagnosis not present

## 2020-08-19 DIAGNOSIS — Z780 Asymptomatic menopausal state: Secondary | ICD-10-CM

## 2020-08-19 DIAGNOSIS — G629 Polyneuropathy, unspecified: Secondary | ICD-10-CM | POA: Diagnosis not present

## 2020-08-19 DIAGNOSIS — C8588 Other specified types of non-Hodgkin lymphoma, lymph nodes of multiple sites: Secondary | ICD-10-CM | POA: Diagnosis not present

## 2020-08-19 MED ORDER — SERTRALINE HCL 25 MG PO TABS: 25.0000 mg | ORAL_TABLET | Freq: Every day | ORAL | 1 refills | Status: DC

## 2020-08-19 NOTE — Patient Instructions (Signed)
It was great seeing you today. You can schedule your bone scan at the front desk   Please call pulmonary    Schedule your bone density test at check out desk. You may also call directly to X-ray at [phone] to schedule an appointment that is convenient for you.  - located [address] across the [address] from [place] - in the basement - you do need an appointment for the bone density tests.

## 2020-08-19 NOTE — Progress Notes (Signed)
Subjective:    [name], female    DOB: 1941-04-17, 79 y.o.   MRN: [id number]  HPI Patient presents for yearly preventative medicine examination. She is a pleasant 79 year old female who  has a past medical history of Cancer ([place]) ( APRIL OF 2000), COPD (chronic obstructive pulmonary disease) ([place]), Depression, Emphysema of lung ([place]), GERD (gastroesophageal reflux disease), Hyperlipidemia, and S/p left hip fracture (04/15/2019).  COPD -currently managed with Brio Ellipta 200-25-25 mcg daily.  He reports that she continues to feel short of breath especially with exertion.  She does monitor her pulse ox at home with readings between 92 and 96% on room air.  She is going to call and schedule an appointment with pulmonary as she has not been seen in multiple years.  H/o of large cell lymphoma of multiple sites -is seen by oncology every 8 weeks.  No signs of cancer recurrence.  Her last CT scan in December 2020 was unremarkable.  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  Reports some days are better than others but for the most part she always have some neuropathic pain in her bilateral lower extremities  B12 deficiency-receives monthly B12 injections.  Ordered by oncology  Depression - well controlled with Zoloft 25 mg daily   Hyperlipidemia - not currently on medication  Lab Results  Component Value Date   CHOL 234 (H) 04/30/2016   HDL 55.60 04/30/2016   LDLCALC 161 (H) 04/30/2016   TRIG 89.0 04/30/2016   CHOLHDL 4 04/30/2016     All immunizations and health maintenance protocols were reviewed with the patient and needed orders were placed.  She is up-to-date on all vaccinations  Appropriate screening laboratory values were ordered for the patient including screening of hyperlipidemia, renal function and hepatic function.  Medication reconciliation,  past medical history, social history, problem list and  allergies were reviewed in detail with the patient  Goals were established with regard to weight loss, exercise, and  diet in compliance with medications  She is due for bone density screen  Review of Systems  Constitutional: Negative.   HENT: Negative.   Eyes: Negative.   Respiratory: Positive for shortness of breath and wheezing.   Cardiovascular: Negative.   Gastrointestinal: Negative.   Endocrine: Negative.   Genitourinary: Negative.   Musculoskeletal: Negative.   Skin: Negative.   Allergic/Immunologic: Negative.   Neurological: Positive for numbness (lower extremities).  Hematological: Negative.   Psychiatric/Behavioral: Negative.    Past Medical History:  Diagnosis Date   Cancer ([place])  APRIL OF 2000   LOW-GRADE NON-HODGKIN'S LYMPHOMA   COPD (chronic obstructive pulmonary disease) ([facility])    Depression    Emphysema of lung ([facility])    GERD (gastroesophageal reflux disease)    Hyperlipidemia    S/p left hip fracture 04/15/2019    Social History   Socioeconomic History   Marital status: Married    Spouse name: Not on file   Number of children: Not on file   Years of education: Not on file   Highest education level: Not on file  Occupational History   Not on file  Tobacco Use   Smoking status: Former Smoker    Packs/day: 0.25    Years: 51.00    Pack years: 12.75    Types: Cigarettes    Quit date: 03/12/2015    Years since quitting: 5.4   Smokeless tobacco: Never Used  Vaping Use   Vaping Use:  Some days  Substance and Sexual Activity   Alcohol use: Yes    Alcohol/week: 0.0 standard drinks    Comment: 1 glass of wine monthly   Drug use: No   Sexual activity: Not Currently    Birth control/protection: Post-menopausal  Other Topics Concern   Not on file  Social History Narrative   Married 35 years    One daughter who lives in [place], [place] for mens and womens clothing   - 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       Pets: Dog      Diet: Healthy diet, fruits, veggies and fish.    Exercise: None       Social Determinants of Health   Financial Resource Strain:    Difficulty of Paying Living Expenses: Not on file  Food Insecurity:    Worried About Charity fundraiser in the Last Year: Not on file   Ran Out of Food in the Last Year: Not on file  Transportation Needs:    Lack of Transportation (Medical): Not on file   Lack of Transportation (Non-Medical): Not on file  Physical Activity:    Days of Exercise per Week: Not on file   Minutes of Exercise per Session: Not on file  Stress:    Feeling of Stress : Not on file  Social Connections:    Frequency of Communication with Friends and Family: Not on file   Frequency of Social Gatherings with Friends and Family: Not on file   Attends Religious Services: Not on file   Active Member of Clubs or Organizations: Not on file   Attends Archivist Meetings: Not on file   Marital Status: Not on file  Intimate Partner Violence:    Fear of Current or Ex-Partner: Not on file   Emotionally Abused: Not on file   Physically Abused: Not on file   Sexually Abused: Not on file    Past Surgical History:  Procedure Laterality Date   CYSTECTOMY W/ URETEROILEAL CONDUIT  1997   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   FRACTURE SURGERY     IR [place] RIGHT  12/07/2017   IR US GUIDE VASC ACCESS RIGHT  12/07/2017   ORIF HUMERUS FRACTURE Right 11/23/2017   Procedure: OPEN REDUCTION INTERNAL FIXATION (ORIF) HUMERAL SHAFT FRACTURE;  Surgeon: Shona Needles, MD;  Location: [facility];  Service: Orthopedics;  Laterality: Right;   RIGHT OOPHORECTOMY  1976   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   Procedure: TOTAL HIP ARTHROPLASTY ANTERIOR APPROACH LEFT HIP;  Surgeon: Rod Can, MD;  Location: [facility];  Service: Orthopedics;  Laterality:  Left;    Family History  Problem Relation Age of Onset   Cancer Mother        colon ca    Allergies  Allergen Reactions   Contrast Media [Iodinated Diagnostic Agents] Shortness Of Breath, Rash and Other (See Comments)    Throat swells   Iohexol Shortness Of Breath   Povidone-Iodine Hives, Shortness Of Breath and Rash    REACTION: throat swells   Penicillins Hives, Itching and Rash    Has patient had a PCN reaction causing immediate rash, facial/tongue/throat swelling, SOB or lightheadedness with hypotension: Unknown Has patient had a PCN reaction causing severe rash involving mucus membranes or skin necrosis: Yes Has patient had  a PCN reaction that required hospitalization: Unknown Has patient had a PCN reaction occurring within the last 10 years: Unknown If all of the above answers are "NO", then may proceed with Cephalosporin use.    Adhesive [Tape] Hives    Current Outpatient Medications on File Prior to Visit  Medication Sig Dispense Refill   albuterol (PROVENTIL HFA;VENTOLIN HFA) 108 (90 Base) MCG/ACT inhaler Inhale 2 puffs into the lungs every 6 (six) hours as needed for wheezing or shortness of breath. 1 Inhaler 0   aspirin EC 81 MG tablet Take 81 mg by mouth daily.     cholecalciferol (VITAMIN D) 1000 units tablet Take 2,000 Units by mouth daily.     fluticasone furoate-vilanterol (BREO ELLIPTA) 200-25 MCG/INH AEPB Inhale 1 puff into the lungs daily. 60 each 6   Probiotic Product (PROBIOTIC PO) Take by mouth.     sertraline (ZOLOFT) 25 MG tablet Take 1 tablet (25 mg total) by mouth daily. 90 tablet 1   No current facility-administered medications on file prior to visit.    BP 110/80 (BP Location: Left Arm, Patient Position: Sitting, Cuff Size: Normal)    Pulse 89    Temp 97.9 F (36.6 C) (Oral)    Ht 5\' 3"  (1.6 m)    Wt 131 lb 9.6 oz (59.7 kg)    LMP  (LMP Unknown)    SpO2 92%    BMI 23.31 kg/m       Objective:   Physical Exam Vitals and nursing note  reviewed.  Constitutional:      General: She is not in acute distress.    Appearance: Normal appearance. She is well-developed. She is not ill-appearing.  HENT:     Head: Normocephalic and atraumatic.     Right Ear: Tympanic membrane, ear canal and external ear normal. There is no impacted cerumen.     Left Ear: Tympanic membrane, ear canal and external ear normal. There is no impacted cerumen.     Nose: Nose normal. No congestion or rhinorrhea.     Mouth/Throat:     Mouth: Mucous membranes are moist.     Pharynx: Oropharynx is clear. No oropharyngeal exudate or posterior oropharyngeal erythema.  Eyes:     General:        Right eye: No discharge.        Left eye: No discharge.     Extraocular Movements: Extraocular movements intact.     Conjunctiva/sclera: Conjunctivae normal.     Pupils: Pupils are equal, round, and reactive to light.  Neck:     Thyroid: No thyromegaly.     Vascular: No carotid bruit.     Trachea: No tracheal deviation.  Cardiovascular:     Rate and Rhythm: Normal rate and regular rhythm.     Pulses: Normal pulses.     Heart sounds: Normal heart sounds. No murmur heard.  No friction rub. No gallop.   Pulmonary:     Effort: Pulmonary effort is normal. No respiratory distress.     Breath sounds: Normal breath sounds. No stridor. No wheezing, rhonchi or rales.  Chest:     Chest wall: No tenderness.  Abdominal:     General: Abdomen is flat. Bowel sounds are normal. There is no distension.     Palpations: Abdomen is soft. There is no mass.     Tenderness: There is no abdominal tenderness. There is no right CVA tenderness, left CVA tenderness, guarding or rebound.     Hernia: No hernia is present.  Musculoskeletal:        General: No swelling, tenderness, deformity or signs of injury. Normal range of motion.     Cervical back: Normal range of motion and neck supple.     Right lower leg: No edema.     Left lower leg: No edema.  Lymphadenopathy:     Cervical: No  cervical adenopathy.  Skin:    General: Skin is warm and dry.     Coloration: Skin is not jaundiced or pale.     Findings: No bruising, erythema, lesion or rash.  Neurological:     General: No focal deficit present.     Mental Status: She is alert and oriented to person, place, and time.     Cranial Nerves: No cranial nerve deficit.     Sensory: No sensory deficit.     Motor: No weakness.     Coordination: Coordination normal.     Gait: Gait normal.     Deep Tendon Reflexes: Reflexes normal.  Psychiatric:        Mood and Affect: Mood normal.        Behavior: Behavior normal.        Thought Content: Thought content normal.        Judgment: Judgment normal.       Assessment & Plan:  1. COPD GOLD II - Continue with Breo Inhaler as directed until seen by Pulmonary   2. Depression, unspecified depression type - Well controlled.  - sertraline (ZOLOFT) 25 MG tablet; Take 1 tablet (25 mg total) by mouth daily.  Dispense: 90 tablet; Refill: 1  3. Neuropathy - Follow up with Oncology as directed  4. Vitamin B12 deficiency - Continue with B12 injections   5. Hyperlipidemia, unspecified hyperlipidemia type - Consider statin  - Lipid panel; Future - TSH; Future  6. Post-menopausal  - DG Bone Density; Future  7. Large cell lymphoma of multiple sites [facility]) - Follow up with Oncology as directed  [name], NP

## 2020-09-01 ENCOUNTER — Telehealth: Payer: Self-pay | Admitting: Adult Health

## 2020-09-01 NOTE — Telephone Encounter (Signed)
Pt is calling in stating that she does not want to do it at this present time b/c she has had a CPE with [facility] on 08/19/2020.

## 2020-09-01 NOTE — Telephone Encounter (Signed)
Left message for patient to schedule Annual Wellness Visit.  Please schedule with Nurse Health Advisor Shannon Crews, RN at [facility]  

## 2020-09-09 ENCOUNTER — Inpatient Hospital Stay: Payer: Medicare Other

## 2020-09-09 ENCOUNTER — Other Ambulatory Visit: Payer: Self-pay

## 2020-09-09 ENCOUNTER — Inpatient Hospital Stay (HOSPITAL_BASED_OUTPATIENT_CLINIC_OR_DEPARTMENT_OTHER): Payer: Medicare Other | Admitting: Hematology and Oncology

## 2020-09-09 ENCOUNTER — Encounter: Payer: Self-pay | Admitting: Hematology and Oncology

## 2020-09-09 VITALS — BP 129/54 | HR 92 | Temp 97.6°F | Resp 18 | Ht 63.0 in | Wt 132.8 lb

## 2020-09-09 DIAGNOSIS — C8588 Other specified types of non-Hodgkin lymphoma, lymph nodes of multiple sites: Secondary | ICD-10-CM

## 2020-09-09 DIAGNOSIS — Z08 Encounter for follow-up examination after completed treatment for malignant neoplasm: Secondary | ICD-10-CM | POA: Diagnosis not present

## 2020-09-09 DIAGNOSIS — Z23 Encounter for immunization: Secondary | ICD-10-CM | POA: Diagnosis not present

## 2020-09-09 DIAGNOSIS — Z8572 Personal history of non-Hodgkin lymphomas: Secondary | ICD-10-CM | POA: Diagnosis not present

## 2020-09-09 DIAGNOSIS — C8338 Diffuse large B-cell lymphoma, lymph nodes of multiple sites: Secondary | ICD-10-CM

## 2020-09-09 DIAGNOSIS — G620 Drug-induced polyneuropathy: Secondary | ICD-10-CM

## 2020-09-09 DIAGNOSIS — T451X5A Adverse effect of antineoplastic and immunosuppressive drugs, initial encounter: Secondary | ICD-10-CM

## 2020-09-09 DIAGNOSIS — E538 Deficiency of other specified B group vitamins: Secondary | ICD-10-CM | POA: Diagnosis not present

## 2020-09-09 DIAGNOSIS — Z9221 Personal history of antineoplastic chemotherapy: Secondary | ICD-10-CM | POA: Diagnosis not present

## 2020-09-09 LAB — COMPREHENSIVE METABOLIC PANEL
ALT: 12 U/L (ref 0–44)
AST: 20 U/L (ref 15–41)
Albumin: 4 g/dL (ref 3.5–5.0)
Alkaline Phosphatase: 125 U/L (ref 38–126)
Anion gap: 11 (ref 5–15)
BUN: 16 mg/dL (ref 8–23)
CO2: 25 mmol/L (ref 22–32)
Calcium: 9.8 mg/dL (ref 8.9–10.3)
Chloride: 106 mmol/L (ref 98–111)
Creatinine, Ser: 0.86 mg/dL (ref 0.44–1.00)
GFR, Estimated: >60 mL/min (ref >60)
Glucose, Bld: 98 mg/dL (ref 70–99)
Potassium: 3.6 mmol/L (ref 3.5–5.1)
Sodium: 142 mmol/L (ref 135–145)
Total Bilirubin: 0.4 mg/dL (ref 0.3–1.2)
Total Protein: 6.6 g/dL (ref 6.5–8.1)

## 2020-09-09 LAB — CBC WITH DIFFERENTIAL/PLATELET
Abs Immature Granulocytes: 0.03 10*3/uL (ref 0.00–0.07)
Basophils Absolute: 0 10*3/uL (ref 0.0–0.1)
Basophils Relative: 1 %
Eosinophils Absolute: 0.4 10*3/uL (ref 0.0–0.5)
Eosinophils Relative: 5 %
HCT: 40.5 % (ref 36.0–46.0)
Hemoglobin: 13.3 g/dL (ref 12.0–15.0)
Immature Granulocytes: 0 %
Lymphocytes Relative: 19 %
Lymphs Abs: 1.4 10*3/uL (ref 0.7–4.0)
MCH: 33.3 pg (ref 26.0–34.0)
MCHC: 32.8 g/dL (ref 30.0–36.0)
MCV: 101.3 fL — ABNORMAL HIGH (ref 80.0–100.0)
Monocytes Absolute: 0.9 10*3/uL (ref 0.1–1.0)
Monocytes Relative: 13 %
Neutro Abs: 4.3 10*3/uL (ref 1.7–7.7)
Neutrophils Relative %: 62 %
Platelets: 252 10*3/uL (ref 150–400)
RBC: 4 MIL/uL (ref 3.87–5.11)
RDW: 13.9 % (ref 11.5–15.5)
WBC: 7 10*3/uL (ref 4.0–10.5)
nRBC: 0 % (ref 0.0–0.2)

## 2020-09-09 MED ORDER — HEPARIN SOD (PORK) LOCK FLUSH 100 UNIT/ML IV SOLN
500.0000 [IU] | Freq: Once | INTRAVENOUS | Status: AC
  Administered 2020-09-09: 500 [IU]
  Filled 2020-09-09: qty 5

## 2020-09-09 MED ORDER — CYANOCOBALAMIN 1000 MCG/ML IJ SOLN
1000.0000 ug | Freq: Once | INTRAMUSCULAR | Status: AC
  Administered 2020-09-09: 1000 ug via INTRAMUSCULAR

## 2020-09-09 MED ORDER — CYANOCOBALAMIN 1000 MCG/ML IJ SOLN
INTRAMUSCULAR | Status: AC
  Filled 2020-09-09: qty 1

## 2020-09-09 MED ORDER — SODIUM CHLORIDE 0.9% FLUSH
10.0000 mL | Freq: Once | INTRAVENOUS | Status: AC
  Administered 2020-09-09: 10 mL
  Filled 2020-09-09: qty 10

## 2020-09-09 NOTE — Assessment & Plan Note (Signed)
She has persistent neuropathy in her feet likely secondary to side effects of chemo and vitamin B12 deficiency Observe for now

## 2020-09-09 NOTE — Assessment & Plan Note (Signed)
Clinically, she has no palpable disease Due to her high risk situation, ultimately, we will plan to order CT imaging in January for assessment We discussed the risk and benefits of keeping her port and she would like it removed

## 2020-09-09 NOTE — Patient Instructions (Signed)

## 2020-09-09 NOTE — Progress Notes (Signed)
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[facility]) Clinically, she has no palpable disease Due to her high risk situation, ultimately, we will plan to order CT imaging in January for assessment We discussed the risk and benefits of keeping her port and she would like it removed  Vitamin B12 deficiency She has history of vitamin B12 deficiency and has been receiving vitamin B12 replacement therapy I plan to recheck vitamin B12 level in her next visit  Peripheral neuropathy due to chemotherapy [facility]) She has persistent neuropathy in her feet likely secondary to side effects of chemo and vitamin B12 deficiency Observe for now   Orders Placed This Encounter  Procedures  . IR REMOVAL TUN ACCESS W/ PORT W/O FL MOD SED    Standing Status:   Future    Standing Expiration Date:   09/09/2021    Order Specific Question:   Reason for exam:    Answer:   chemo is completed    Order Specific Question:   Preferred Imaging Location?    Answer:   [facility]  . CT Chest Wo Contrast    Standing Status:   Future    Standing Expiration Date:   09/09/2021    Order Specific Question:   Preferred imaging location?    Answer:   [facility]  . CT Abdomen Pelvis Wo Contrast    Standing Status:   Future    Standing Expiration Date:   09/09/2021    Order Specific Question:   Preferred imaging location?    Answer:   [facility]    Order Specific Question:   Is Oral Contrast requested for this exam?    Answer:   Yes, Per Radiology protocol  . Vitamin B12    Standing Status:   Standing    Number of Occurrences:   2    Standing Expiration Date:   09/09/2021    All questions were answered. The patient knows to call the clinic with any problems, questions or concerns. The total time spent in the appointment was 20 minutes encounter with  patients including review of chart and various tests results, discussions about plan of care and coordination of care plan   [name], MD 09/09/2020 1:05 PM  INTERVAL HISTORY: Please see below for problem oriented charting. 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 She was treated with chlorambucil in 2000.   --2007 - 09/2008.  Treated with single agent Rituxan.   --12/2008.  She had progressive adenopathy in chest and abdomen and repeat biopsy showed low grade follicular and diffuse B cell NHL.   --01/2009 - 07/2009. She required left ureteral stent and was treated with Treanda/Rituxan x 5 cycles from 02-2009 thru 06-2009.    09/16/2014 Imaging   No lymphadenopathy in the chest, abdomen, or pelvis. The small right axillary lymph nodes seen on the previous exam are  unchanged in the 18 month interval. Small to moderate hiatal hernia.   11/20/2017 Imaging   Mildly displaced and angulated pathologic fracture of the proximal humeral shaft. 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    11/22/2017 PET scan   New hypermetabolic lymphadenopathy within the chest and abdomen, consistent with recurrent lymphoma. New large hypermetabolic mass in the right hepatic lobe, consistent with lymphoma this involvement. New small hypermetabolic mass in the left kidney, which may be due to lymphomatous involvement although primary renal cell  carcinoma cannot be excluded. Hypermetabolic mass in the right humerus with pathologic fracture, consistent with lymphomatous involvement. Small hypermetabolic foci in right posterior chest wall, also suspicious for recurrent lymphoma.   11/23/2017 Procedure   Bone, fragment(s), Right Humeral Shaft Reamings - ATYPICAL LYMPHOID INFILTRATE CONSISTENT WITH NON-HODGKIN'S B CELL LYMPHOMA. - SEE ONCOLOGY TABLE. Microscopic Comment LYMPHOMA Histologic type: Non-Hodgkins lymphoma, favor large cell type. Grade (if applicable): Favor high grade. Flow cytometry: No specimen is available for analysis. Immunohistochemical stains: BCL-2, BCL-6, CD3, CD5, CD10, CD15, CD20, CD30, CD34, CD43, LCA, CD79a, CD138, Ki-67, PAX-5 and TdT with appropriate controls as performed on block 1C. Touch preps/imprints: Not performed. Comments: The sections show multiple, primarily soft tissue fragments displaying a dense lymphoid infiltrate characterized by a mixture of small round to slightly irregular lymphocytes, histiocytes, and large centroblastic appearing lymphoid cells with partially clumped to vesicular chromatin, small nucleoli and amphophilic to clear cytoplasm. The number of admixed large lymphoid cells is variable but they appear relatively abundant in some areas associated with clustering. No material is available for flow cytometric analysis. Hence, a large batter of immunohistochemical stains were performed and show a mixture of T and B cells but with a significant B cell component mostly composed of large lymphoid cells with positivity for CD79a, PAX-5, CD30, BCL-2, and BCL-6. LCA shows diffuse staining although some of the large lymphoid cells appear negative. No significant staining is seen with CD20, CD10, CD15, CD34, TdT, or CD138. The lack of CD20 expression in this material is possibly related to prior treatment. There is variable increased expression of Ki-67 ranging from 10 to >50% in some areas particularly  where there is abundance of large lymphoid cells. There is admixed abundant T cell population in the background as seen with CD3, CD5, and CD43 with on apparent co-expression of CD5 in B cell areas. The overall findings are consistent with involvement by non-Hodgkin's B cell lymphoma and the presence of relative abundance of large lymphoid B-cells and increased Ki-67 expression favor a high grade large B cell lymphoma.   11/23/2017 Surgery   1. CPT 24516-Intramedullary nailing of right humerus fracture 2. CPT 20245-Bone biopsy, deep     11/23/2017 - 11/25/2017 Hospital Admission   She was admitted to the hospital for management of humerus fracture and aggressive lymphoma   11/24/2017 Imaging   ECHO showed LV EF: 65% -   70%   11/25/2017 Procedure   Status post ultrasound-guided biopsy of liver mass. Tissue specimen sent to pathology for complete histopathologic analysis   12/07/2017 Imaging   Successful placement of a right internal jugular approach power injectable Port-A-Cath. The catheter is ready for immediate use.   12/09/2017 - 03/29/2018 Chemotherapy   The patient had R-CHOP chemo     12/17/2017 - 12/20/2017 Hospital Admission   She was hospitalized for influenza infection   01/03/2018 Procedure   Unsuccessful initial L4-5 LP. Successful L2-3 lumbar puncture with installation of methotrexate.   01/24/2018 Procedure  Non complicated injection of intrathecal methotrexate, as above.   02/09/2018 Imaging   LV EF: 55% -  60%   02/10/2018 PET scan   1. Interval response to therapy. There has been resolution of previous hypermetabolic tumor within the chest and abdomen. Deauville criteria 2 and 3. 2. Diffuse radiotracer uptake is identified within the axial and appendicular skeleton which is favored to represent treatment related changes. 3. Significant decrease in FDG uptake associated with previously noted hypermetabolic tumor involving the proximal humeral diaphysis. The FDG uptake within  this area on today's study is equivalent to that which is seen in the lumbar spine. 4. Aortic Atherosclerosis (ICD10-I70.0). Lad coronary artery calcifications. 5. Hiatal hernia   02/15/2018 Procedure   Fluoroscopic guided lumbar puncture and intrathecal injection of chemotherapy without complication.   03/31/2018 Procedure   Technically successful fluoroscopic guided lumbar puncture for intrathecal methotrexate injection. Procedure was well tolerated without evidence for immediate complications. Patient will recover in short stay prior to being discharged home   05/08/2018 PET scan   No residual/persistent enlarged or hypermetabolic lymph nodes in the neck, chest, abdomen or pelvis. Findings suggest an excellent response to treatment.   05/25/2018 -  Chemotherapy   The patient had Revlimid for maintenance chemo    06/07/2018 Imaging   LV EF: 55% -  60%   09/25/2018 PET scan   1. Stable exam. No hypermetabolic lymphadenopathy in the neck, chest, abdomen, or pelvis. 2.  Aortic Atherosclerois (ICD10-170.0) 3.  Emphysema. (ICD10-J43.9)    04/09/2019 Imaging   1. No CT findings without contrast to suggest recurrent lymphoma involving the chest, abdomen or pelvis. 2. Stable advanced atherosclerotic calcifications involving the thoracic and abdominal aorta and branch vessels. 3. Moderate to large hiatal hernia. 4. No significant bony findings.   10/08/2019 Imaging   1. No CT findings to suggest recurrent lymphoma. No lymphadenopathy in the lower neck, chest, abdomen, pelvis or inguinal regions. 2. Stable advanced atherosclerotic calcifications involving the thoracic and abdominal aorta and branch vessels. 3. Moderate to large hiatal hernia. 4. Stable sclerotic bone lesions involving the right ischium and right hip. New left total hip arthroplasty noted.       REVIEW OF SYSTEMS:   Constitutional: Denies fevers, chills or abnormal weight loss Eyes: Denies blurriness of vision Ears,  nose, mouth, throat, and face: Denies mucositis or sore throat Respiratory: Denies cough, dyspnea or wheezes Cardiovascular: Denies palpitation, chest discomfort or lower extremity swelling Gastrointestinal:  Denies nausea, heartburn or change in bowel habits Skin: Denies abnormal skin rashes Lymphatics: Denies new lymphadenopathy or easy bruising Behavioral/Psych: Mood is stable, no new changes  All other systems were reviewed with the patient and are negative.  I have reviewed the past medical history, past surgical history, social history and family history with the patient and they are unchanged from previous note.  ALLERGIES:  is allergic to contrast media [iodinated diagnostic agents], iohexol, povidone-iodine, penicillins, and adhesive [tape].  MEDICATIONS:  Current Outpatient Medications  Medication Sig Dispense Refill  . albuterol (PROVENTIL HFA;VENTOLIN HFA) 108 (90 Base) MCG/ACT inhaler Inhale 2 puffs into the lungs every 6 (six) hours as needed for wheezing or shortness of breath. 1 Inhaler 0  . aspirin EC 81 MG tablet Take 81 mg by mouth daily.    . cholecalciferol (VITAMIN D) 1000 units tablet Take 2,000 Units by mouth daily.    . fluticasone furoate-vilanterol (BREO ELLIPTA) 200-25 MCG/INH AEPB Inhale 1 puff into the lungs daily. 60 each 6  . Probiotic  Product (PROBIOTIC PO) Take by mouth.    . sertraline (ZOLOFT) 25 MG tablet Take 1 tablet (25 mg total) by mouth daily. 90 tablet 1   No current facility-administered medications for this visit.    PHYSICAL EXAMINATION: ECOG PERFORMANCE STATUS: 1 - Symptomatic but completely ambulatory  Vitals:   09/09/20 0957  BP: (!) 129/54  Pulse: 92  Resp: 18  Temp: 97.6 F (36.4 C)  SpO2: 100%   Filed Weights   09/09/20 0957  Weight: 132 lb 12.8 oz (60.2 kg)    GENERAL:alert, no distress and comfortable SKIN: skin color, texture, turgor are normal, no rashes or significant lesions EYES: normal, Conjunctiva are pink and  non-injected, sclera clear OROPHARYNX:no exudate, no erythema and lips, buccal mucosa, and tongue normal  NECK: supple, thyroid normal size, non-tender, without nodularity LYMPH:  no palpable lymphadenopathy in the cervical, axillary or inguinal LUNGS: clear to auscultation and percussion with normal breathing effort HEART: regular rate & rhythm and no murmurs and no lower extremity edema ABDOMEN:abdomen soft, non-tender and normal bowel sounds Musculoskeletal:no cyanosis of digits and no clubbing  NEURO: alert & oriented x 3 with fluent speech, no focal motor/sensory deficits  LABORATORY DATA:  I have reviewed the data as listed    Component Value Date/Time   NA 142 09/09/2020 0926   NA 142 12/28/2016 1002   K 3.6 09/09/2020 0926   K 4.6 12/28/2016 1002   CL 106 09/09/2020 0926   CL 101 03/28/2013 0826   CO2 25 09/09/2020 0926   CO2 29 12/28/2016 1002   GLUCOSE 98 09/09/2020 0926   GLUCOSE 85 12/28/2016 1002   GLUCOSE 84 03/28/2013 0826   BUN 16 09/09/2020 0926   BUN 20.3 12/28/2016 1002   CREATININE 0.86 09/09/2020 0926   CREATININE 0.78 05/03/2019 1019   CREATININE 0.9 12/28/2016 1002   CALCIUM 9.8 09/09/2020 0926   CALCIUM 10.1 12/28/2016 1002   PROT 6.6 09/09/2020 0926   PROT 6.8 12/28/2016 1002   ALBUMIN 4.0 09/09/2020 0926   ALBUMIN 4.2 12/28/2016 1002   AST 20 09/09/2020 0926   AST 12 (L) 05/03/2019 1019   AST 20 12/28/2016 1002   ALT 12 09/09/2020 0926   ALT 10 05/03/2019 1019   ALT 12 12/28/2016 1002   ALKPHOS 125 09/09/2020 0926   ALKPHOS 111 12/28/2016 1002   BILITOT 0.4 09/09/2020 0926   BILITOT 0.3 05/03/2019 1019   BILITOT 0.55 12/28/2016 1002   GFRNONAA >60 09/09/2020 0926   GFRNONAA >60 05/03/2019 1019   GFRAA >60 05/20/2020 0928   GFRAA >60 05/03/2019 1019    No results found for: SPEP, UPEP  Lab Results  Component Value Date   WBC 7.0 09/09/2020   NEUTROABS 4.3 09/09/2020   HGB 13.3 09/09/2020   HCT 40.5 09/09/2020   MCV 101.3 (H)  09/09/2020   PLT 252 09/09/2020      Chemistry      Component Value Date/Time   NA 142 09/09/2020 0926   NA 142 12/28/2016 1002   K 3.6 09/09/2020 0926   K 4.6 12/28/2016 1002   CL 106 09/09/2020 0926   CL 101 03/28/2013 0826   CO2 25 09/09/2020 0926   CO2 29 12/28/2016 1002   BUN 16 09/09/2020 0926   BUN 20.3 12/28/2016 1002   CREATININE 0.86 09/09/2020 0926   CREATININE 0.78 05/03/2019 1019   CREATININE 0.9 12/28/2016 1002      Component Value Date/Time   CALCIUM 9.8 09/09/2020 0926   CALCIUM  10.1 12/28/2016 1002   ALKPHOS 125 09/09/2020 0926   ALKPHOS 111 12/28/2016 1002   AST 20 09/09/2020 0926   AST 12 (L) 05/03/2019 1019   AST 20 12/28/2016 1002   ALT 12 09/09/2020 0926   ALT 10 05/03/2019 1019   ALT 12 12/28/2016 1002   BILITOT 0.4 09/09/2020 0926   BILITOT 0.3 05/03/2019 1019   BILITOT 0.55 12/28/2016 1002

## 2020-09-09 NOTE — Assessment & Plan Note (Signed)
She has history of vitamin B12 deficiency and has been receiving vitamin B12 replacement therapy I plan to recheck vitamin B12 level in her next visit

## 2020-10-20 ENCOUNTER — Other Ambulatory Visit: Payer: Self-pay

## 2020-10-20 ENCOUNTER — Ambulatory Visit (HOSPITAL_COMMUNITY)
Admission: RE | Admit: 2020-10-20 | Discharge: 2020-10-20 | Disposition: A | Discharge status: Home / Self Care | Payer: Medicare Other | Source: Ambulatory Visit | Attending: Hematology and Oncology | Admitting: Hematology and Oncology

## 2020-10-20 ENCOUNTER — Inpatient Hospital Stay: Payer: Medicare Other | Attending: Hematology and Oncology

## 2020-10-20 DIAGNOSIS — E538 Deficiency of other specified B group vitamins: Secondary | ICD-10-CM | POA: Insufficient documentation

## 2020-10-20 DIAGNOSIS — J984 Other disorders of lung: Secondary | ICD-10-CM | POA: Diagnosis not present

## 2020-10-20 DIAGNOSIS — Z5111 Encounter for antineoplastic chemotherapy: Secondary | ICD-10-CM | POA: Diagnosis not present

## 2020-10-20 DIAGNOSIS — C8588 Other specified types of non-Hodgkin lymphoma, lymph nodes of multiple sites: Secondary | ICD-10-CM

## 2020-10-20 DIAGNOSIS — I2510 Atherosclerotic heart disease of native coronary artery without angina pectoris: Secondary | ICD-10-CM | POA: Diagnosis not present

## 2020-10-20 DIAGNOSIS — J439 Emphysema, unspecified: Secondary | ICD-10-CM | POA: Diagnosis not present

## 2020-10-20 DIAGNOSIS — C8593 Non-Hodgkin lymphoma, unspecified, intra-abdominal lymph nodes: Secondary | ICD-10-CM | POA: Diagnosis not present

## 2020-10-20 DIAGNOSIS — S32591A Other specified fracture of right pubis, initial encounter for closed fracture: Secondary | ICD-10-CM | POA: Diagnosis not present

## 2020-10-20 DIAGNOSIS — Z8572 Personal history of non-Hodgkin lymphomas: Secondary | ICD-10-CM | POA: Diagnosis not present

## 2020-10-20 DIAGNOSIS — C8338 Diffuse large B-cell lymphoma, lymph nodes of multiple sites: Secondary | ICD-10-CM

## 2020-10-20 DIAGNOSIS — K7689 Other specified diseases of liver: Secondary | ICD-10-CM | POA: Diagnosis not present

## 2020-10-20 LAB — CBC WITH DIFFERENTIAL/PLATELET
Abs Immature Granulocytes: 0.04 10*3/uL (ref 0.00–0.07)
Basophils Absolute: 0.1 10*3/uL (ref 0.0–0.1)
Basophils Relative: 1 %
Eosinophils Absolute: 0.4 10*3/uL (ref 0.0–0.5)
Eosinophils Relative: 7 %
HCT: 43.9 % (ref 36.0–46.0)
Hemoglobin: 14.2 g/dL (ref 12.0–15.0)
Immature Granulocytes: 1 %
Lymphocytes Relative: 22 %
Lymphs Abs: 1.4 10*3/uL (ref 0.7–4.0)
MCH: 32.9 pg (ref 26.0–34.0)
MCHC: 32.3 g/dL (ref 30.0–36.0)
MCV: 101.6 fL — ABNORMAL HIGH (ref 80.0–100.0)
Monocytes Absolute: 0.7 10*3/uL (ref 0.1–1.0)
Monocytes Relative: 11 %
Neutro Abs: 3.7 10*3/uL (ref 1.7–7.7)
Neutrophils Relative %: 58 %
Platelets: 275 10*3/uL (ref 150–400)
RBC: 4.32 MIL/uL (ref 3.87–5.11)
RDW: 13.7 % (ref 11.5–15.5)
WBC: 6.3 10*3/uL (ref 4.0–10.5)
nRBC: 0 % (ref 0.0–0.2)

## 2020-10-20 LAB — COMPREHENSIVE METABOLIC PANEL
ALT: 10 U/L (ref 0–44)
AST: 18 U/L (ref 15–41)
Albumin: 4.1 g/dL (ref 3.5–5.0)
Alkaline Phosphatase: 132 U/L — ABNORMAL HIGH (ref 38–126)
Anion gap: 10 (ref 5–15)
BUN: 14 mg/dL (ref 8–23)
CO2: 28 mmol/L (ref 22–32)
Calcium: 9.8 mg/dL (ref 8.9–10.3)
Chloride: 104 mmol/L (ref 98–111)
Creatinine, Ser: 0.85 mg/dL (ref 0.44–1.00)
GFR, Estimated: >60 mL/min (ref >60)
Glucose, Bld: 84 mg/dL (ref 70–99)
Potassium: 4.1 mmol/L (ref 3.5–5.1)
Sodium: 142 mmol/L (ref 135–145)
Total Bilirubin: 0.5 mg/dL (ref 0.3–1.2)
Total Protein: 6.9 g/dL (ref 6.5–8.1)

## 2020-10-20 LAB — VITAMIN B12: Vitamin B-12: 276 pg/mL (ref 180–914)

## 2020-10-21 ENCOUNTER — Other Ambulatory Visit: Payer: Self-pay

## 2020-10-21 ENCOUNTER — Encounter: Payer: Self-pay | Admitting: Hematology and Oncology

## 2020-10-21 ENCOUNTER — Inpatient Hospital Stay: Payer: Medicare Other

## 2020-10-21 ENCOUNTER — Inpatient Hospital Stay (HOSPITAL_BASED_OUTPATIENT_CLINIC_OR_DEPARTMENT_OTHER): Payer: Medicare Other | Admitting: Hematology and Oncology

## 2020-10-21 VITALS — BP 141/68 | HR 76 | Temp 97.5°F | Resp 15 | Ht 63.0 in | Wt 133.8 lb

## 2020-10-21 DIAGNOSIS — E538 Deficiency of other specified B group vitamins: Secondary | ICD-10-CM

## 2020-10-21 DIAGNOSIS — Z8572 Personal history of non-Hodgkin lymphomas: Secondary | ICD-10-CM | POA: Diagnosis not present

## 2020-10-21 DIAGNOSIS — C8588 Other specified types of non-Hodgkin lymphoma, lymph nodes of multiple sites: Secondary | ICD-10-CM

## 2020-10-21 MED ORDER — CYANOCOBALAMIN 1000 MCG/ML IJ SOLN: 1000.0000 ug | Freq: Once | INTRAMUSCULAR | Status: DC

## 2020-10-21 MED ORDER — CYANOCOBALAMIN 1000 MCG/ML IJ SOLN
1000.0000 ug | Freq: Once | INTRAMUSCULAR | Status: AC
  Administered 2020-10-21: 1000 ug via INTRAMUSCULAR

## 2020-10-21 NOTE — Progress Notes (Signed)
b12 administered on doctor side by Darliss Ridgel

## 2020-10-21 NOTE — Progress Notes (Signed)
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 Comprehensive metabolic panel    Standing Status:   Standing    Number of Occurrences:   33    Standing Expiration Date:   10/21/2021  . CBC with Differential/Platelet    Standing Status:   Standing    Number of Occurrences:   22    Standing Expiration Date:   10/21/2021  . Vitamin B12    Standing Status:   Standing    Number of Occurrences:   3    Standing Expiration Date:   10/21/2021    All questions were answered. The patient knows to call the clinic with any problems, questions or concerns. The total time spent in the appointment was 20 minutes encounter with patients including review of chart and various tests results, discussions about plan of care and coordination of care plan   [name], MD 10/21/2020 10:38 AM  INTERVAL HISTORY: Please see below for problem oriented charting. 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 She was treated with chlorambucil in 2000.   --2007 - 09/2008.  Treated with single agent Rituxan.   --12/2008.  She had progressive adenopathy in chest and abdomen and repeat biopsy showed low grade follicular and diffuse B cell NHL.   --01/2009 - 07/2009. She required left ureteral stent and was treated with Treanda/Rituxan x 5 cycles from 02-2009 thru 06-2009.    09/16/2014 Imaging   No lymphadenopathy in the chest, abdomen, or pelvis. The small right axillary lymph nodes seen on the previous exam are unchanged in the 18 month interval. Small to moderate hiatal hernia.   11/20/2017 Imaging   Mildly displaced and angulated pathologic fracture of the proximal humeral shaft. 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    11/22/2017 PET scan   New hypermetabolic lymphadenopathy within the chest and abdomen, consistent with recurrent lymphoma. New large hypermetabolic mass in the right hepatic lobe, consistent with lymphoma this involvement. New small hypermetabolic mass in the left kidney, which may be due to lymphomatous involvement although primary renal cell carcinoma cannot be excluded. Hypermetabolic mass in the right humerus with pathologic fracture, consistent with lymphomatous involvement. Small hypermetabolic foci in right posterior chest wall, also suspicious for  recurrent lymphoma.   11/23/2017 Procedure   Bone, fragment(s), Right Humeral Shaft Reamings - ATYPICAL LYMPHOID INFILTRATE CONSISTENT WITH NON-HODGKIN'S B CELL LYMPHOMA. - SEE ONCOLOGY TABLE. Microscopic Comment LYMPHOMA Histologic type: Non-Hodgkins lymphoma, favor large cell type. Grade (if applicable): Favor high grade. Flow cytometry: No specimen is  available for analysis. Immunohistochemical stains: BCL-2, BCL-6, CD3, CD5, CD10, CD15, CD20, CD30, CD34, CD43, LCA, CD79a, CD138, Ki-67, PAX-5 and TdT with appropriate controls as performed on block 1C. Touch preps/imprints: Not performed. Comments: The sections show multiple, primarily soft tissue fragments displaying a dense lymphoid infiltrate characterized by a mixture of small round to slightly irregular lymphocytes, histiocytes, and large centroblastic appearing lymphoid cells with partially clumped to vesicular chromatin, small nucleoli and amphophilic to clear cytoplasm. The number of admixed large lymphoid cells is variable but they appear relatively abundant in some areas associated with clustering. No material is available for flow cytometric analysis. Hence, a large batter of immunohistochemical stains were performed and show a mixture of T and B cells but with a significant B cell component mostly composed of large lymphoid cells with positivity for CD79a, PAX-5, CD30, BCL-2, and BCL-6. LCA shows diffuse staining although some of the large lymphoid cells appear negative. No significant staining is seen with CD20, CD10, CD15, CD34, TdT, or CD138. The lack of CD20 expression in this material is possibly related to prior treatment. There is variable increased expression of Ki-67 ranging from 10 to >50% in some areas particularly where there is abundance of large lymphoid cells. There is admixed abundant T cell population in the background as seen with CD3, CD5, and CD43 with on apparent co-expression of CD5 in B cell areas. The overall findings are consistent with involvement by non-Hodgkin's B cell lymphoma and the presence of relative abundance of large lymphoid B-cells and increased Ki-67 expression favor a high grade large B cell lymphoma.   11/23/2017 Surgery   1. CPT 24516-Intramedullary nailing of right humerus fracture 2. CPT 20245-Bone biopsy, deep     11/23/2017 - 11/25/2017 Hospital  Admission   She was admitted to the hospital for management of humerus fracture and aggressive lymphoma   11/24/2017 Imaging   ECHO showed LV EF: 65% -   70%   11/25/2017 Procedure   Status post ultrasound-guided biopsy of liver mass. Tissue specimen sent to pathology for complete histopathologic analysis   12/07/2017 Imaging   Successful placement of a right internal jugular approach power injectable Port-A-Cath. The catheter is ready for immediate use.   12/09/2017 - 03/29/2018 Chemotherapy   The patient had R-CHOP chemo     12/17/2017 - 12/20/2017 Hospital Admission   She was hospitalized for influenza infection   01/03/2018 Procedure   Unsuccessful initial L4-5 LP. Successful L2-3 lumbar puncture with installation of methotrexate.   01/24/2018 Procedure   Non complicated injection of intrathecal methotrexate, as above.   02/09/2018 Imaging   LV EF: 55% -  60%   02/10/2018 PET scan   1. Interval response to therapy. There has been resolution of previous hypermetabolic tumor within the chest and abdomen. Deauville criteria 2 and 3. 2. Diffuse radiotracer uptake is identified within the axial and appendicular skeleton which is favored to represent treatment related changes. 3. Significant decrease in FDG uptake associated with previously noted hypermetabolic tumor involving the proximal humeral diaphysis. The FDG uptake within this area on today's study is equivalent to that which is seen in the lumbar spine. 4. Aortic Atherosclerosis (ICD10-I70.0). Lad coronary artery calcifications. 5. Hiatal  hernia   02/15/2018 Procedure   Fluoroscopic guided lumbar puncture and intrathecal injection of chemotherapy without complication.   03/31/2018 Procedure   Technically successful fluoroscopic guided lumbar puncture for intrathecal methotrexate injection. Procedure was well tolerated without evidence for immediate complications. Patient will recover in short stay prior to being discharged home    05/08/2018 PET scan   No residual/persistent enlarged or hypermetabolic lymph nodes in the neck, chest, abdomen or pelvis. Findings suggest an excellent response to treatment.   05/25/2018 -  Chemotherapy   The patient had Revlimid for maintenance chemo    06/07/2018 Imaging   LV EF: 55% -  60%   09/25/2018 PET scan   1. Stable exam. No hypermetabolic lymphadenopathy in the neck, chest, abdomen, or pelvis. 2.  Aortic Atherosclerois (ICD10-170.0) 3.  Emphysema. (ICD10-J43.9)    04/09/2019 Imaging   1. No CT findings without contrast to suggest recurrent lymphoma involving the chest, abdomen or pelvis. 2. Stable advanced atherosclerotic calcifications involving the thoracic and abdominal aorta and branch vessels. 3. Moderate to large hiatal hernia. 4. No significant bony findings.   10/08/2019 Imaging   1. No CT findings to suggest recurrent lymphoma. No lymphadenopathy in the lower neck, chest, abdomen, pelvis or inguinal regions. 2. Stable advanced atherosclerotic calcifications involving the thoracic and abdominal aorta and branch vessels. 3. Moderate to large hiatal hernia. 4. Stable sclerotic bone lesions involving the right ischium and right hip. New left total hip arthroplasty noted.     10/20/2020 Imaging   1. No acute process or evidence of active lymphoma within the chest, abdomen, or pelvis. 2. Degraded evaluation of the pelvis, secondary to beam hardening artifact from left hip arthroplasty. 3. Slight increased calcification within the hepatic dome, likely dystrophic at the site of a dominant mass back on the CT of 12/24/2017. Recommend attention on follow-up. 4. Aortic atherosclerosis (ICD10-I70.0), coronary artery atherosclerosis and emphysema (ICD10-J43.9).     REVIEW OF SYSTEMS:   Constitutional: Denies fevers, chills or abnormal weight loss Eyes: Denies blurriness of vision Ears, nose, mouth, throat, and face: Denies mucositis or sore throat Respiratory: Denies  cough, dyspnea or wheezes Cardiovascular: Denies palpitation, chest discomfort or lower extremity swelling Gastrointestinal:  Denies nausea, heartburn or change in bowel habits Skin: Denies abnormal skin rashes Lymphatics: Denies new lymphadenopathy or easy bruising Neurological:Denies numbness, tingling or new weaknesses Behavioral/Psych: Mood is stable, no new changes  All other systems were reviewed with the patient and are negative.  I have reviewed the past medical history, past surgical history, social history and family history with the patient and they are unchanged from previous note.  ALLERGIES:  is allergic to contrast media [iodinated diagnostic agents], iohexol, povidone-iodine, penicillins, and adhesive [tape].  MEDICATIONS:  Current Outpatient Medications  Medication Sig Dispense Refill  . albuterol (PROVENTIL HFA;VENTOLIN HFA) 108 (90 Base) MCG/ACT inhaler Inhale 2 puffs into the lungs every 6 (six) hours as needed for wheezing or shortness of breath. 1 Inhaler 0  . aspirin EC 81 MG tablet Take 81 mg by mouth daily.    . cholecalciferol (VITAMIN D) 1000 units tablet Take 2,000 Units by mouth daily.    . fluticasone furoate-vilanterol (BREO ELLIPTA) 200-25 MCG/INH AEPB Inhale 1 puff into the lungs daily. 60 each 6  . Probiotic Product (PROBIOTIC PO) Take by mouth.    . sertraline (ZOLOFT) 25 MG tablet Take 1 tablet (25 mg total) by mouth daily. 90 tablet 1   No current facility-administered medications for this visit.  Facility-Administered Medications Ordered in Other Visits  Medication Dose Route Frequency Provider Last Rate Last Admin  . cyanocobalamin ((VITAMIN B-12)) injection 1,000 mcg  1,000 mcg Intramuscular Once [name], MD        PHYSICAL EXAMINATION: ECOG PERFORMANCE STATUS: 0 - Asymptomatic  Vitals:   10/21/20 0912  BP: (!) 141/68  Pulse: 76  Resp: 15  Temp: (!) 97.5 F (36.4 C)  SpO2: 94%   Filed Weights   10/21/20 0912  Weight: 133 lb 12.8  oz (60.7 kg)    GENERAL:alert, no distress and comfortable NEURO: alert & oriented x 3 with fluent speech, no focal motor/sensory deficits  LABORATORY DATA:  I have reviewed the data as listed    Component Value Date/Time   NA 142 10/20/2020 0751   NA 142 12/28/2016 1002   K 4.1 10/20/2020 0751   K 4.6 12/28/2016 1002   CL 104 10/20/2020 0751   CL 101 03/28/2013 0826   CO2 28 10/20/2020 0751   CO2 29 12/28/2016 1002   GLUCOSE 84 10/20/2020 0751   GLUCOSE 85 12/28/2016 1002   GLUCOSE 84 03/28/2013 0826   BUN 14 10/20/2020 0751   BUN 20.3 12/28/2016 1002   CREATININE 0.85 10/20/2020 0751   CREATININE 0.78 05/03/2019 1019   CREATININE 0.9 12/28/2016 1002   CALCIUM 9.8 10/20/2020 0751   CALCIUM 10.1 12/28/2016 1002   PROT 6.9 10/20/2020 0751   PROT 6.8 12/28/2016 1002   ALBUMIN 4.1 10/20/2020 0751   ALBUMIN 4.2 12/28/2016 1002   AST 18 10/20/2020 0751   AST 12 (L) 05/03/2019 1019   AST 20 12/28/2016 1002   ALT 10 10/20/2020 0751   ALT 10 05/03/2019 1019   ALT 12 12/28/2016 1002   ALKPHOS 132 (H) 10/20/2020 0751   ALKPHOS 111 12/28/2016 1002   BILITOT 0.5 10/20/2020 0751   BILITOT 0.3 05/03/2019 1019   BILITOT 0.55 12/28/2016 1002   GFRNONAA >60 10/20/2020 0751   GFRNONAA >60 05/03/2019 1019   GFRAA >60 05/20/2020 0928   GFRAA >60 05/03/2019 1019    No results found for: SPEP, UPEP  Lab Results  Component Value Date   WBC 6.3 10/20/2020   NEUTROABS 3.7 10/20/2020   HGB 14.2 10/20/2020   HCT 43.9 10/20/2020   MCV 101.6 (H) 10/20/2020   PLT 275 10/20/2020      Chemistry      Component Value Date/Time   NA 142 10/20/2020 0751   NA 142 12/28/2016 1002   K 4.1 10/20/2020 0751   K 4.6 12/28/2016 1002   CL 104 10/20/2020 0751   CL 101 03/28/2013 0826   CO2 28 10/20/2020 0751   CO2 29 12/28/2016 1002   BUN 14 10/20/2020 0751   BUN 20.3 12/28/2016 1002   CREATININE 0.85 10/20/2020 0751   CREATININE 0.78 05/03/2019 1019   CREATININE 0.9 12/28/2016 1002       Component Value Date/Time   CALCIUM 9.8 10/20/2020 0751   CALCIUM 10.1 12/28/2016 1002   ALKPHOS 132 (H) 10/20/2020 0751   ALKPHOS 111 12/28/2016 1002   AST 18 10/20/2020 0751   AST 12 (L) 05/03/2019 1019   AST 20 12/28/2016 1002   ALT 10 10/20/2020 0751   ALT 10 05/03/2019 1019   ALT 12 12/28/2016 1002   BILITOT 0.5 10/20/2020 0751   BILITOT 0.3 05/03/2019 1019   BILITOT 0.55 12/28/2016 1002       RADIOGRAPHIC STUDIES: I have reviewed multiple imaging studies with the patient and husband I  have personally reviewed the radiological images as listed and agreed with the findings in the report. CT Abdomen Pelvis Wo Contrast  Result Date: 10/20/2020 CLINICAL DATA:  Non-Hodgkin's lymphoma. Right oophorectomy chemotherapy. Asymptomatic. EXAM: CT CHEST, ABDOMEN AND PELVIS WITHOUT CONTRAST TECHNIQUE: Multidetector CT imaging of the chest, abdomen and pelvis was performed following the standard protocol without IV contrast. COMPARISON:  10/08/2019 FINDINGS: CT CHEST FINDINGS Cardiovascular: Right Port-A-Cath tip superior caval/atrial junction. Aortic atherosclerosis. Normal heart size, without pericardial effusion. Lad coronary artery calcification. Mediastinum/Nodes: No supraclavicular adenopathy. No axillary adenopathy. No mediastinal or definite hilar adenopathy, given limitations of unenhanced CT. Moderate hiatal hernia. Lungs/Pleura: No pleural fluid.  Moderate bullous type emphysema. Lingular, left apical, and left base scarring. Musculoskeletal: No acute osseous abnormality. Right proximal humerus fixation. CT ABDOMEN PELVIS FINDINGS Hepatobiliary: High right hepatic lobe calcification including on 50/2 is increased since the prior exam. Minimal hypoattenuation in this area including on 51/2. This is at the site of a hypoattenuating mass on 12/24/2017. Normal gallbladder, without biliary ductal dilatation. Pancreas: Normal, without mass or ductal dilatation. Spleen: Normal in size, without  focal abnormality. Adrenals/Urinary Tract: Normal adrenal glands. Mild renal cortical thinning bilaterally. Degraded evaluation of the pelvis, secondary to beam hardening artifact from left hip arthroplasty. Grossly normal noncontrast appearance of the urinary bladder. Stomach/Bowel: Normal remainder of the stomach. Scattered colonic diverticula. Normal terminal ileum and appendix. Normal small bowel. Vascular/Lymphatic: Aortic atherosclerosis. No abdominopelvic adenopathy. Reproductive: Partial hysterectomy versus uterine atrophy. No adnexal mass. Other: No significant free fluid.  Mild pelvic floor laxity. Musculoskeletal: Osteopenia. Remote right pubic rami fractures. Similar appearance of pelvic sclerotic lesions, nonspecific. IMPRESSION: 1. No acute process or evidence of active lymphoma within the chest, abdomen, or pelvis. 2. Degraded evaluation of the pelvis, secondary to beam hardening artifact from left hip arthroplasty. 3. Slight increased calcification within the hepatic dome, likely dystrophic at the site of a dominant mass back on the CT of 12/24/2017. Recommend attention on follow-up. 4. Aortic atherosclerosis (ICD10-I70.0), coronary artery atherosclerosis and emphysema (ICD10-J43.9). Electronically Signed   By: Abigail Miyamoto M.D.   On: 10/20/2020 10:05   CT Chest Wo Contrast  Result Date: 10/20/2020 CLINICAL DATA:  Non-Hodgkin's lymphoma. Right oophorectomy chemotherapy. Asymptomatic. EXAM: CT CHEST, ABDOMEN AND PELVIS WITHOUT CONTRAST TECHNIQUE: Multidetector CT imaging of the chest, abdomen and pelvis was performed following the standard protocol without IV contrast. COMPARISON:  10/08/2019 FINDINGS: CT CHEST FINDINGS Cardiovascular: Right Port-A-Cath tip superior caval/atrial junction. Aortic atherosclerosis. Normal heart size, without pericardial effusion. Lad coronary artery calcification. Mediastinum/Nodes: No supraclavicular adenopathy. No axillary adenopathy. No mediastinal or definite  hilar adenopathy, given limitations of unenhanced CT. Moderate hiatal hernia. Lungs/Pleura: No pleural fluid.  Moderate bullous type emphysema. Lingular, left apical, and left base scarring. Musculoskeletal: No acute osseous abnormality. Right proximal humerus fixation. CT ABDOMEN PELVIS FINDINGS Hepatobiliary: High right hepatic lobe calcification including on 50/2 is increased since the prior exam. Minimal hypoattenuation in this area including on 51/2. This is at the site of a hypoattenuating mass on 12/24/2017. Normal gallbladder, without biliary ductal dilatation. Pancreas: Normal, without mass or ductal dilatation. Spleen: Normal in size, without focal abnormality. Adrenals/Urinary Tract: Normal adrenal glands. Mild renal cortical thinning bilaterally. Degraded evaluation of the pelvis, secondary to beam hardening artifact from left hip arthroplasty. Grossly normal noncontrast appearance of the urinary bladder. Stomach/Bowel: Normal remainder of the stomach. Scattered colonic diverticula. Normal terminal ileum and appendix. Normal small bowel. Vascular/Lymphatic: Aortic atherosclerosis. No abdominopelvic adenopathy. Reproductive: Partial hysterectomy versus uterine atrophy.  No adnexal mass. Other: No significant free fluid.  Mild pelvic floor laxity. Musculoskeletal: Osteopenia. Remote right pubic rami fractures. Similar appearance of pelvic sclerotic lesions, nonspecific. IMPRESSION: 1. No acute process or evidence of active lymphoma within the chest, abdomen, or pelvis. 2. Degraded evaluation of the pelvis, secondary to beam hardening artifact from left hip arthroplasty. 3. Slight increased calcification within the hepatic dome, likely dystrophic at the site of a dominant mass back on the CT of 12/24/2017. Recommend attention on follow-up. 4. Aortic atherosclerosis (ICD10-I70.0), coronary artery atherosclerosis and emphysema (ICD10-J43.9). Electronically Signed   By: Abigail Miyamoto M.D.   On: 10/20/2020 10:05

## 2020-10-21 NOTE — Assessment & Plan Note (Signed)
She is doing well with vitamin B12 injections Plan to switch her to every 6 months in the future I will check vitamin B12 again in her next visit

## 2020-10-21 NOTE — Patient Instructions (Signed)

## 2020-10-21 NOTE — Assessment & Plan Note (Signed)
I have reviewed multiple imaging studies with the patient and her husband She has no signs of cancer recurrence She is scheduled for port removal next week I plan to see her in 6 months for further follow-up I explained to the husband that there is no benefit for routine surveillance imaging in the absence of symptoms

## 2020-10-24 ENCOUNTER — Other Ambulatory Visit: Payer: Self-pay | Admitting: Radiology

## 2020-10-27 ENCOUNTER — Ambulatory Visit (HOSPITAL_COMMUNITY): Payer: Medicare Other

## 2020-10-27 ENCOUNTER — Inpatient Hospital Stay (HOSPITAL_COMMUNITY): Admission: RE | Admit: 2020-10-27 | Payer: Medicare Other | Source: Ambulatory Visit

## 2020-10-29 ENCOUNTER — Other Ambulatory Visit: Payer: Self-pay | Admitting: Adult Health

## 2020-10-29 DIAGNOSIS — J449 Chronic obstructive pulmonary disease, unspecified: Secondary | ICD-10-CM

## 2020-10-29 NOTE — Telephone Encounter (Signed)
Sent to the pharmacy by e-scribe. 

## 2020-10-31 DIAGNOSIS — H5310 Unspecified subjective visual disturbances: Secondary | ICD-10-CM | POA: Diagnosis not present

## 2020-10-31 DIAGNOSIS — Z961 Presence of intraocular lens: Secondary | ICD-10-CM | POA: Diagnosis not present

## 2020-10-31 DIAGNOSIS — H5203 Hypermetropia, bilateral: Secondary | ICD-10-CM | POA: Diagnosis not present

## 2020-11-03 ENCOUNTER — Other Ambulatory Visit: Payer: Self-pay | Admitting: Radiology

## 2020-11-04 ENCOUNTER — Ambulatory Visit (HOSPITAL_COMMUNITY)
Admission: RE | Admit: 2020-11-04 | Discharge: 2020-11-04 | Disposition: A | Discharge status: Home / Self Care | Payer: Medicare Other | Source: Ambulatory Visit | Attending: Hematology and Oncology | Admitting: Hematology and Oncology

## 2020-11-04 ENCOUNTER — Encounter (HOSPITAL_COMMUNITY): Payer: Self-pay

## 2020-11-04 ENCOUNTER — Other Ambulatory Visit: Payer: Self-pay

## 2020-11-04 DIAGNOSIS — Z96642 Presence of left artificial hip joint: Secondary | ICD-10-CM | POA: Insufficient documentation

## 2020-11-04 DIAGNOSIS — C8588 Other specified types of non-Hodgkin lymphoma, lymph nodes of multiple sites: Secondary | ICD-10-CM | POA: Diagnosis not present

## 2020-11-04 DIAGNOSIS — Z7982 Long term (current) use of aspirin: Secondary | ICD-10-CM | POA: Diagnosis not present

## 2020-11-04 DIAGNOSIS — Z8572 Personal history of non-Hodgkin lymphomas: Secondary | ICD-10-CM | POA: Diagnosis not present

## 2020-11-04 DIAGNOSIS — Z452 Encounter for adjustment and management of vascular access device: Secondary | ICD-10-CM | POA: Insufficient documentation

## 2020-11-04 DIAGNOSIS — Z90721 Acquired absence of ovaries, unilateral: Secondary | ICD-10-CM | POA: Diagnosis not present

## 2020-11-04 DIAGNOSIS — Z800 Family history of malignant neoplasm of digestive organs: Secondary | ICD-10-CM | POA: Insufficient documentation

## 2020-11-04 DIAGNOSIS — Z87891 Personal history of nicotine dependence: Secondary | ICD-10-CM | POA: Insufficient documentation

## 2020-11-04 DIAGNOSIS — Z79899 Other long term (current) drug therapy: Secondary | ICD-10-CM | POA: Diagnosis not present

## 2020-11-04 HISTORY — PX: IR REMOVAL TUN ACCESS W/ PORT W/O FL MOD SED: IMG2290

## 2020-11-04 LAB — CBC WITH DIFFERENTIAL/PLATELET
Abs Immature Granulocytes: 0.03 10*3/uL (ref 0.00–0.07)
Basophils Absolute: 0.1 10*3/uL (ref 0.0–0.1)
Basophils Relative: 1 %
Eosinophils Absolute: 0.3 10*3/uL (ref 0.0–0.5)
Eosinophils Relative: 5 %
HCT: 46.1 % — ABNORMAL HIGH (ref 36.0–46.0)
Hemoglobin: 15.1 g/dL — ABNORMAL HIGH (ref 12.0–15.0)
Immature Granulocytes: 0 %
Lymphocytes Relative: 19 %
Lymphs Abs: 1.4 10*3/uL (ref 0.7–4.0)
MCH: 34.2 pg — ABNORMAL HIGH (ref 26.0–34.0)
MCHC: 32.8 g/dL (ref 30.0–36.0)
MCV: 104.5 fL — ABNORMAL HIGH (ref 80.0–100.0)
Monocytes Absolute: 0.7 10*3/uL (ref 0.1–1.0)
Monocytes Relative: 10 %
Neutro Abs: 5 10*3/uL (ref 1.7–7.7)
Neutrophils Relative %: 65 %
Platelets: 271 10*3/uL (ref 150–400)
RBC: 4.41 MIL/uL (ref 3.87–5.11)
RDW: 13.7 % (ref 11.5–15.5)
WBC: 7.5 10*3/uL (ref 4.0–10.5)
nRBC: 0 % (ref 0.0–0.2)

## 2020-11-04 LAB — PROTIME-INR
INR: 1 (ref 0.8–1.2)
Prothrombin Time: 12.4 seconds (ref 11.4–15.2)

## 2020-11-04 MED ORDER — CLINDAMYCIN PHOSPHATE 900 MG/50ML IV SOLN: 900.0000 mg | Freq: Once | INTRAVENOUS | Status: AC

## 2020-11-04 MED ORDER — MIDAZOLAM HCL 2 MG/2ML IJ SOLN
INTRAMUSCULAR | Status: AC
  Filled 2020-11-04: qty 2

## 2020-11-04 MED ORDER — CLINDAMYCIN PHOSPHATE 900 MG/50ML IV SOLN
INTRAVENOUS | Status: AC
  Administered 2020-11-04: 900 mg via INTRAVENOUS
  Filled 2020-11-04: qty 50

## 2020-11-04 MED ORDER — SODIUM CHLORIDE 0.9 % IV SOLN: INTRAVENOUS | Status: DC

## 2020-11-04 MED ORDER — MIDAZOLAM HCL 2 MG/2ML IJ SOLN
INTRAMUSCULAR | Status: AC | PRN
  Administered 2020-11-04: 0.5 mg via INTRAVENOUS
  Administered 2020-11-04: 1 mg via INTRAVENOUS

## 2020-11-04 MED ORDER — FENTANYL CITRATE (PF) 100 MCG/2ML IJ SOLN
INTRAMUSCULAR | Status: AC
  Filled 2020-11-04: qty 2

## 2020-11-04 MED ORDER — FENTANYL CITRATE (PF) 100 MCG/2ML IJ SOLN
INTRAMUSCULAR | Status: AC | PRN
Start: 2020-11-04 — End: 2020-11-04
  Administered 2020-11-04: 50 ug via INTRAVENOUS
  Administered 2020-11-04: 25 ug via INTRAVENOUS

## 2020-11-04 MED ORDER — LIDOCAINE-EPINEPHRINE 1 %-1:100000 IJ SOLN
INTRAMUSCULAR | Status: AC | PRN
  Administered 2020-11-04: 10 mL via INTRADERMAL

## 2020-11-04 MED ORDER — LIDOCAINE-EPINEPHRINE 1 %-1:100000 IJ SOLN
INTRAMUSCULAR | Status: AC
  Filled 2020-11-04: qty 1

## 2020-11-04 NOTE — Procedures (Signed)
Pre Procedural Dx: Poor venous access; Completed chemotherapy Post Procedural Dx: Same  Successful removal of anterior chest wall port-a-cath.  EBL: Minimal  No immediate post procedural complications.   Ronny Bacon, MD Pager #: [phone]

## 2020-11-04 NOTE — Discharge Instructions (Addendum)
Interventional radiology phone numbers [phone] After hours [phone]     Implanted Port Removal, Care After This sheet gives you information about how to care for yourself after your procedure. Your health care provider may also give you more specific instructions. If you have problems or questions, contact your health care provider. What can I expect after the procedure? After the procedure, it is common to have:  Soreness or pain near your incision.  Some swelling or bruising near your incision. Follow these instructions at home: Medicines  Take over-the-counter and prescription medicines only as told by your health care provider.  If you were prescribed an antibiotic medicine, take it as told by your health care provider. Do not stop taking the antibiotic even if you start to feel better. Bathing  Do not take baths, swim, or use a hot tub until your health care provider approves. You may shower 11/05/20 around noon. Incision care  Follow instructions from your health care provider about how to take care of your incision. Make sure you: ? Wash your hands with soap and water before you change your bandage (dressing). If soap and water are not available, use hand sanitizer. ? Change your dressing as told by your health care provider. You may remove your dressing 11/05/20 and shower. The skin glue will stay in place for 10-14 days. ? Keep your dressing dry. ? Leave  skin glue in place. These skin closures may need to stay in place for 10-14 days or longer.    Check your incision area every day for signs of infection. Check for: ? More redness, swelling, or pain. ? More fluid or blood. ? Warmth. ? Pus or a bad smell.   Driving  Do not drive for 24 hours if you were given a medicine to help you relax (sedative) during your procedure.  If you did not receive a sedative, ask your health care provider when it is safe to drive.   Activity  Return to your normal activities  as told by your health care provider. Ask your health care provider what activities are safe for you.  Do not lift anything that is heavier than 10 lb (4.5 kg), or the limit that you are told, until your health care provider says that it is safe.  Do not do activities that involve lifting your arms over your head. General instructions  Do not use any products that contain nicotine or tobacco, such as cigarettes and e-cigarettes. These can delay healing. If you need help quitting, ask your health care provider.  Keep all follow-up visits as told by your health care provider. This is important. Contact a health care provider if:  You have more redness, swelling, or pain around your incision.  You have more fluid or blood coming from your incision.  Your incision feels warm to the touch.  You have pus or a bad smell coming from your incision.  You have pain that is not relieved by your pain medicine. Get help right away if you have:  A fever or chills.  Chest pain.  Difficulty breathing. Summary  After the procedure, it is common to have pain, soreness, swelling, or bruising near your incision.  If you were prescribed an antibiotic medicine, take it as told by your health care provider. Do not stop taking the antibiotic even if you start to feel better.  Do not drive for 24 hours if you were given a sedative during your procedure.  Return to your normal activities  as told by your health care provider. Ask your health care provider what activities are safe for you. This information is not intended to replace advice given to you by your health care provider. Make sure you discuss any questions you have with your health care provider. Document Revised: 11/10/2017 Document Reviewed: 11/10/2017 Elsevier Patient Education  2021 [employer].     Moderate Conscious Sedation, Adult, Care After This sheet gives you information about how to care for yourself after your procedure.  Your health care provider may also give you more specific instructions. If you have problems or questions, contact your health care provider. What can I expect after the procedure? After the procedure, it is common to have:  Sleepiness for several hours.  Impaired judgment for several hours.  Difficulty with balance.  Vomiting if you eat too soon. Follow these instructions at home: For the time period you were told by your health care provider:  Rest.  Do not participate in activities where you could fall or become injured.  Do not drive or use machinery.  Do not drink alcohol.  Do not take sleeping pills or medicines that cause drowsiness.  Do not make important decisions or sign legal documents.  Do not take care of children on your own.      Eating and drinking  Follow the diet recommended by your health care provider.  Drink enough fluid to keep your urine pale yellow.  If you vomit: ? Drink water, juice, or soup when you can drink without vomiting. ? Make sure you have little or no nausea before eating solid foods.   General instructions  Take over-the-counter and prescription medicines only as told by your health care provider.  Have a responsible adult stay with you for the time you are told. It is important to have someone help care for you until you are awake and alert.  Do not smoke.  Keep all follow-up visits as told by your health care provider. This is important. Contact a health care provider if:  You are still sleepy or having trouble with balance after 24 hours.  You feel light-headed.  You keep feeling nauseous or you keep vomiting.  You develop a rash.  You have a fever.  You have redness or swelling around the IV site. Get help right away if:  You have trouble breathing.  You have new-onset confusion at home. Summary  After the procedure, it is common to feel sleepy, have impaired judgment, or feel nauseous if you eat too  soon.  Rest after you get home. Know the things you should not do after the procedure.  Follow the diet recommended by your health care provider and drink enough fluid to keep your urine pale yellow.  Get help right away if you have trouble breathing or new-onset confusion at home. This information is not intended to replace advice given to you by your health care provider. Make sure you discuss any questions you have with your health care provider. Document Revised: 01/25/2020 Document Reviewed: 08/23/2019 Elsevier Patient Education  2021 [employer].

## 2020-11-04 NOTE — H&P (Signed)
Chief Complaint: Completion of chemotherapy. Request is for portacath removal no longer needed  Referring Physician(s): Gorsuch,Ni  Supervising Physician: Sandi Mariscal  Patient Status: [facility] - Out-pt  History of Present Illness: Brooke Washington is a 80 y.o. female 80 y.o female outpatient. History of lymphoma. IR placed a RIJ  portacath on 2.27.19 for chemotherapy access.  Patient has completed chemotherapy. Team is requesting a portacath removal no longer needed.   Currently without any significant complaints. Patient alert and laying in bed, calm and comfortable. Denies any fevers, headache, chest pain, SOB, cough, abdominal pain, nausea, vomiting or bleeding. Brooke Washington endorses left eye pain she describes as "burning" due to mascara that got in her eye. She denies any loss of vision. Return precautions and treatment recommendations and follow-up discussed with the patient who is agreeable with the plan.    Past Medical History:  Diagnosis Date  . Cancer ([place])  APRIL OF 2000   LOW-GRADE NON-HODGKIN'S LYMPHOMA  . COPD (chronic obstructive pulmonary disease) ([place])   . Depression   . Emphysema of lung ([place])   . GERD (gastroesophageal reflux disease)   . Hyperlipidemia   . S/p left hip fracture 04/15/2019    Past Surgical History:  Procedure Laterality Date  . 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 FRACTURE SURGERY    . IR FLUORO GUIDE PORT INSERTION RIGHT  12/07/2017  . IR US GUIDE VASC ACCESS RIGHT  12/07/2017  . ORIF HUMERUS FRACTURE Right 11/23/2017   Procedure: OPEN REDUCTION INTERNAL FIXATION (ORIF) HUMERAL SHAFT FRACTURE;  Surgeon: Shona Needles, MD;  Location: [place];  Service: Orthopedics;  Laterality: Right;  . RIGHT OOPHORECTOMY  1976  . 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   Procedure: TOTAL HIP ARTHROPLASTY ANTERIOR APPROACH LEFT HIP;  Surgeon: Rod Can, MD;  Location: [place];   Service: Orthopedics;  Laterality: Left;    Allergies: Contrast media [iodinated diagnostic agents], Iohexol, Povidone-iodine, Penicillins, and Adhesive [tape]  Medications: Prior to Admission medications   Medication Sig Start Date End Date Taking? Authorizing Provider  albuterol (PROVENTIL HFA;VENTOLIN HFA) 108 (90 Base) MCG/ACT inhaler Inhale 2 puffs into the lungs every 6 (six) hours as needed for wheezing or shortness of breath. 10/23/18  Yes [place], Betty G, MD  aspirin EC 81 MG tablet Take 81 mg by mouth daily.   Yes [provider]  [place] Patter 200-25 MCG/INH AEPB INHALE 1 PUFF DAILY AS DIRECTED 10/29/20  Yes Nafziger, Tommi Rumps, NP  cholecalciferol (VITAMIN D) 1000 units tablet Take 2,000 Units by mouth daily.   Yes [provider]  Probiotic Product (PROBIOTIC PO) Take by mouth.   Yes [provider]  sertraline (ZOLOFT) 25 MG tablet Take 1 tablet (25 mg total) by mouth daily. 08/19/20  Yes Nafziger, Tommi Rumps, NP     Family History  Problem Relation Age of Onset  . Cancer Mother        colon ca    Social History   Socioeconomic History  . Marital status: Married    Spouse name: Not on file  . Number of children: Not on file  . Years of education: Not on file  . Highest education level: Not on file  Occupational History  . Not on file  Tobacco Use  . Smoking status: Former Smoker    Packs/day: 0.25    Years: 51.00    Pack years: 12.75    Types: Cigarettes  Quit date: 03/12/2015    Years since quitting: 5.6  . Smokeless tobacco: Never Used  Vaping Use  . Vaping Use: Some days  Substance and Sexual Activity  . Alcohol use: Yes    Alcohol/week: 0.0 standard drinks    Comment: 1 glass of wine monthly  . Drug use: No  . Sexual activity: Not Currently    Birth control/protection: Post-menopausal  Other Topics Concern  . 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       Pets: Dog      Diet: Healthy diet, fruits, veggies and fish.    Exercise: None       Social Determinants of Health   Financial Resource Strain: Not on file  Food Insecurity: Not on file  Transportation Needs: Not on file  Physical Activity: Not on file  Stress: Not on file  Social Connections: Not on file     Review of Systems: A 12 point ROS discussed and pertinent positives are indicated in the HPI above.  All other systems are negative.  Review of Systems  Constitutional: Negative for fatigue and fever.  HENT: Negative for congestion.   Eyes: Positive for pain ( "burning" from mascara that got in her eye.).  Respiratory: Negative for cough and shortness of breath.   Gastrointestinal: Negative for abdominal pain, diarrhea, nausea and vomiting.    Vital Signs: BP (!) 144/70   Pulse 88   Temp 98.2 F (36.8 C) (Oral)   Resp 16   LMP  (LMP Unknown)   SpO2 91%   Physical Exam Vitals and nursing note reviewed.  Constitutional:      Appearance: She is well-developed.  HENT:     Head: Normocephalic and atraumatic.  Eyes:     Conjunctiva/sclera: Conjunctivae normal.  Cardiovascular:     Rate and Rhythm: Normal rate and regular rhythm.     Heart sounds: Normal heart sounds.  Pulmonary:     Effort: Pulmonary effort is normal.     Breath sounds: Normal breath sounds.  Musculoskeletal:        General: Normal range of motion.     Cervical back: Normal range of motion.  Skin:    General: Skin is warm.  Neurological:     Mental Status: She is alert and oriented to person, place, and time.     Imaging: CT Abdomen Pelvis Wo Contrast  Result Date: 10/20/2020 CLINICAL DATA:  Non-Hodgkin's lymphoma. Right oophorectomy chemotherapy. Asymptomatic. EXAM: CT CHEST, ABDOMEN AND PELVIS WITHOUT CONTRAST TECHNIQUE: Multidetector CT imaging of  the chest, abdomen and pelvis was performed following the standard protocol without IV contrast. COMPARISON:  10/08/2019 FINDINGS: CT CHEST FINDINGS Cardiovascular: Right Port-A-Cath tip superior caval/atrial junction. Aortic atherosclerosis. Normal heart size, without pericardial effusion. Lad coronary artery calcification. Mediastinum/Nodes: No supraclavicular adenopathy. No axillary adenopathy. No mediastinal or definite hilar adenopathy, given limitations of unenhanced CT. Moderate hiatal hernia. Lungs/Pleura: No pleural fluid.  Moderate bullous type emphysema. Lingular, left apical, and left base scarring. Musculoskeletal: No acute osseous abnormality. Right proximal humerus fixation. CT ABDOMEN PELVIS FINDINGS Hepatobiliary: High right hepatic lobe calcification including on 50/2 is increased since the prior exam. Minimal hypoattenuation in this area including  on 51/2. This is at the site of a hypoattenuating mass on 12/24/2017. Normal gallbladder, without biliary ductal dilatation. Pancreas: Normal, without mass or ductal dilatation. Spleen: Normal in size, without focal abnormality. Adrenals/Urinary Tract: Normal adrenal glands. Mild renal cortical thinning bilaterally. Degraded evaluation of the pelvis, secondary to beam hardening artifact from left hip arthroplasty. Grossly normal noncontrast appearance of the urinary bladder. Stomach/Bowel: Normal remainder of the stomach. Scattered colonic diverticula. Normal terminal ileum and appendix. Normal small bowel. Vascular/Lymphatic: Aortic atherosclerosis. No abdominopelvic adenopathy. Reproductive: Partial hysterectomy versus uterine atrophy. No adnexal mass. Other: No significant free fluid.  Mild pelvic floor laxity. Musculoskeletal: Osteopenia. Remote right pubic rami fractures. Similar appearance of pelvic sclerotic lesions, nonspecific. IMPRESSION: 1. No acute process or evidence of active lymphoma within the chest, abdomen, or pelvis. 2. Degraded  evaluation of the pelvis, secondary to beam hardening artifact from left hip arthroplasty. 3. Slight increased calcification within the hepatic dome, likely dystrophic at the site of a dominant mass back on the CT of 12/24/2017. Recommend attention on follow-up. 4. Aortic atherosclerosis (ICD10-I70.0), coronary artery atherosclerosis and emphysema (ICD10-J43.9). Electronically Signed   By: Abigail Miyamoto M.D.   On: 10/20/2020 10:05   CT Chest Wo Contrast  Result Date: 10/20/2020 CLINICAL DATA:  Non-Hodgkin's lymphoma. Right oophorectomy chemotherapy. Asymptomatic. EXAM: CT CHEST, ABDOMEN AND PELVIS WITHOUT CONTRAST TECHNIQUE: Multidetector CT imaging of the chest, abdomen and pelvis was performed following the standard protocol without IV contrast. COMPARISON:  10/08/2019 FINDINGS: CT CHEST FINDINGS Cardiovascular: Right Port-A-Cath tip superior caval/atrial junction. Aortic atherosclerosis. Normal heart size, without pericardial effusion. Lad coronary artery calcification. Mediastinum/Nodes: No supraclavicular adenopathy. No axillary adenopathy. No mediastinal or definite hilar adenopathy, given limitations of unenhanced CT. Moderate hiatal hernia. Lungs/Pleura: No pleural fluid.  Moderate bullous type emphysema. Lingular, left apical, and left base scarring. Musculoskeletal: No acute osseous abnormality. Right proximal humerus fixation. CT ABDOMEN PELVIS FINDINGS Hepatobiliary: High right hepatic lobe calcification including on 50/2 is increased since the prior exam. Minimal hypoattenuation in this area including on 51/2. This is at the site of a hypoattenuating mass on 12/24/2017. Normal gallbladder, without biliary ductal dilatation. Pancreas: Normal, without mass or ductal dilatation. Spleen: Normal in size, without focal abnormality. Adrenals/Urinary Tract: Normal adrenal glands. Mild renal cortical thinning bilaterally. Degraded evaluation of the pelvis, secondary to beam hardening artifact from left hip  arthroplasty. Grossly normal noncontrast appearance of the urinary bladder. Stomach/Bowel: Normal remainder of the stomach. Scattered colonic diverticula. Normal terminal ileum and appendix. Normal small bowel. Vascular/Lymphatic: Aortic atherosclerosis. No abdominopelvic adenopathy. Reproductive: Partial hysterectomy versus uterine atrophy. No adnexal mass. Other: No significant free fluid.  Mild pelvic floor laxity. Musculoskeletal: Osteopenia. Remote right pubic rami fractures. Similar appearance of pelvic sclerotic lesions, nonspecific. IMPRESSION: 1. No acute process or evidence of active lymphoma within the chest, abdomen, or pelvis. 2. Degraded evaluation of the pelvis, secondary to beam hardening artifact from left hip arthroplasty. 3. Slight increased calcification within the hepatic dome, likely dystrophic at the site of a dominant mass back on the CT of 12/24/2017. Recommend attention on follow-up. 4. Aortic atherosclerosis (ICD10-I70.0), coronary artery atherosclerosis and emphysema (ICD10-J43.9). Electronically Signed   By: Abigail Miyamoto M.D.   On: 10/20/2020 10:05    Labs:  CBC: Recent Labs    07/15/20 0926 09/09/20 0926 10/20/20 0751 11/04/20 1004  WBC 7.1 7.0 6.3 7.5  HGB 13.5 13.3 14.2 15.1*  HCT 41.0 40.5 43.9 46.1*  PLT 268 252 275 271    COAGS: No results  for input(s): INR, APTT in the last 8760 hours.  BMP: Recent Labs    12/04/19 1039 01/29/20 0915 03/25/20 0900 05/20/20 0928 07/15/20 0926 09/09/20 0926 10/20/20 0751  NA 142 143 144 140 141 142 142  K 4.1 3.7 4.0 3.8 3.9 3.6 4.1  CL 107 107 108 105 106 106 104  CO2 28 26 26 25 27 25 28   GLUCOSE 85 91 82 87 84 98 84  BUN 16 17 17 15 14 16 14   CALCIUM 9.2 9.2 9.8 10.2 9.8 9.8 9.8  CREATININE 0.73 0.82 0.91 0.84 0.82 0.86 0.85  GFRNONAA >60 >60 >60 >60 >60 >60 >60  GFRAA >60 >60 >60 >60  --   --   --     LIVER FUNCTION TESTS: Recent Labs    05/20/20 0928 07/15/20 0926 09/09/20 0926 10/20/20 0751   BILITOT 0.4 0.5 0.4 0.5  AST 15 18 20 18   ALT 7 13 12 10   ALKPHOS 140* 141* 125 132*  PROT 6.7 6.8 6.6 6.9  ALBUMIN 3.9 4.0 4.0 4.1    Assessment and Plan:  80 y.o female outpatient. History of lymphoma. IR placed a RIJ  portacath on 2.27.19 for chemotherapy access.  Patient has completed chemotherapy. Team is requesting a portacath removal no longer needed.  All labs from 1.10.22 and medications are within acceptable parameters. Allergies include contrast medica, povidone iodine, Tape and PCN. Patient has been NPO since midnight.  Risks and benefits of image guided port-a-catheter removal was discussed with the patient including, but not limited to bleeding, infection, pneumothorax, or fibrin sheath development and need for additional procedures.  All of the patient's questions were answered, patient is agreeable to proceed. Consent signed and in chart.    Thank you for this interesting consult.  I greatly enjoyed meeting [facility] and look forward to participating in their care.  A copy of this report was sent to the requesting provider on this date.  Electronically Signed: Jacqualine Mau, NP 11/04/2020, 10:48 AM   I spent a total of  30 Minutes   in face to face in clinical consultation, greater than 50% of which was counseling/coordinating care for portacath removal

## 2021-01-08 ENCOUNTER — Telehealth: Payer: Self-pay | Admitting: Adult Health

## 2021-01-08 NOTE — Telephone Encounter (Signed)
Left message for patient to call back and schedule Medicare Annual Wellness Visit (AWV) either virtually or in office. No detailed message left     AWV-I PER PALMETTO 10/11/09  please schedule at anytime with [facility] Nurse Health Advisor 1 or 2   This should be a 45 minute visit.

## 2021-01-08 NOTE — Telephone Encounter (Signed)
Pt called back and stated that at this present time she is not interested in scheduling an appointment.

## 2021-01-09 NOTE — Telephone Encounter (Signed)
Documented on spreedsheet

## 2021-01-27 ENCOUNTER — Encounter: Payer: Self-pay | Admitting: Hematology and Oncology

## 2021-03-12 ENCOUNTER — Other Ambulatory Visit: Payer: Self-pay

## 2021-03-13 ENCOUNTER — Encounter: Payer: Self-pay | Admitting: Adult Health

## 2021-03-13 ENCOUNTER — Ambulatory Visit (INDEPENDENT_AMBULATORY_CARE_PROVIDER_SITE_OTHER): Payer: Medicare Other | Admitting: Adult Health

## 2021-03-13 VITALS — BP 120/64 | HR 95 | Temp 98.0°F | Ht 63.0 in | Wt 129.0 lb

## 2021-03-13 DIAGNOSIS — R21 Rash and other nonspecific skin eruption: Secondary | ICD-10-CM | POA: Diagnosis not present

## 2021-03-13 MED ORDER — TRIAMCINOLONE ACETONIDE 0.5 % EX CREA: TOPICAL_CREAM | Freq: Two times a day (BID) | CUTANEOUS | 2 refills | Status: DC

## 2021-03-13 NOTE — Progress Notes (Signed)
Subjective:    [name], female    DOB: Sep 19, 1941, 80 y.o.   MRN: [id number]  HPI 80 year old female who  has a past medical history of Cancer ([place]) ( APRIL OF 2000), COPD (chronic obstructive pulmonary disease) ([place]), Depression, Emphysema of lung ([place]), GERD (gastroesophageal reflux disease), Hyperlipidemia, and S/p left hip fracture (04/15/2019).  She presents to the office today for an acute issue red itchy rash throughout her body that has been present for at least a few weeks.  She reports that the rash started on palms of her hands and have since spread to both upper and lower extremities as well as abdomen and back.  No changes in detergents, lotions, or shampoos.  No recent travel outside of the country.  Her husband who sleeps in the same bed has not experienced the same rash.  She has tried anti-itch lotion which helps to some degree.  Has not noticed any drainage or pustules.   Review of Systems See HPI   Past Medical History:  Diagnosis Date  . Cancer ([place])  APRIL OF 2000   LOW-GRADE NON-HODGKIN'S LYMPHOMA  . COPD (chronic obstructive pulmonary disease) ([place])   . Depression   . Emphysema of lung ([place])   . GERD (gastroesophageal reflux disease)   . Hyperlipidemia   . S/p left hip fracture 04/15/2019    Social History   Socioeconomic History  . Marital status: Married    Spouse name: Not on file  . Number of children: Not on file  . Years of education: Not on file  . Highest education level: Not on file  Occupational History  . Not on file  Tobacco Use  . Smoking status: Former Smoker    Packs/day: 0.25    Years: 51.00    Pack years: 12.75    Types: Cigarettes    Quit date: 03/12/2015    Years since quitting: 6.0  . Smokeless tobacco: Never Used  Vaping Use  . Vaping Use: Some days  Substance and Sexual Activity  . Alcohol use: Yes    Alcohol/week: 0.0 standard drinks    Comment: 1 glass of wine monthly  . Drug use: No  . Sexual  activity: Not Currently    Birth control/protection: Post-menopausal  Other Topics Concern  . 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       Pets: Dog      Diet: Healthy diet, fruits, veggies and fish.    Exercise: None       Social Determinants of Health   Financial Resource Strain: Not on file  Food Insecurity: Not on file  Transportation Needs: Not on file  Physical Activity: Not on file  Stress: Not on file  Social Connections: Not on file  Intimate Partner Violence: Not on file    Past Surgical History:  Procedure Laterality Date  . 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 FRACTURE SURGERY    . IR FLUORO GUIDE PORT INSERTION RIGHT  12/07/2017  . IR REMOVAL TUN ACCESS W/ PORT W/O FL MOD SED  11/04/2020  . IR US GUIDE  VASC ACCESS RIGHT  12/07/2017  . ORIF HUMERUS FRACTURE Right 11/23/2017   Procedure: OPEN REDUCTION INTERNAL FIXATION (ORIF) HUMERAL SHAFT FRACTURE;  Surgeon: Shona Needles, MD;  Location: [facility];  Service: Orthopedics;  Laterality: Right;  . RIGHT OOPHORECTOMY  1976  . 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   Procedure: TOTAL HIP ARTHROPLASTY ANTERIOR APPROACH LEFT HIP;  Surgeon: Rod Can, MD;  Location: [facility];  Service: Orthopedics;  Laterality: Left;    Family History  Problem Relation Age of Onset  . Cancer Mother        colon ca    Allergies  Allergen Reactions  . Contrast Media [Iodinated Diagnostic Agents] Shortness Of Breath, Rash and Other (See Comments)    Throat swells  . Iohexol Shortness Of Breath  . Povidone-Iodine Hives, Shortness Of Breath and Rash    REACTION: throat swells  . Penicillins Hives, Itching and Rash    Has patient had a PCN reaction  causing immediate rash, facial/tongue/throat swelling, SOB or lightheadedness with hypotension: Unknown Has patient had a PCN reaction causing severe rash involving mucus membranes or skin necrosis: Yes Has patient had a PCN reaction that required hospitalization: Unknown Has patient had a PCN reaction occurring within the last 10 years: Unknown If all of the above answers are "NO", then may proceed with Cephalosporin use.   . Adhesive [Tape] Hives    Current Outpatient Medications on File Prior to Visit  Medication Sig Dispense Refill  . albuterol (PROVENTIL HFA;VENTOLIN HFA) 108 (90 Base) MCG/ACT inhaler Inhale 2 puffs into the lungs every 6 (six) hours as needed for wheezing or shortness of breath. 1 Inhaler 0  . aspirin EC 81 MG tablet Take 81 mg by mouth daily.    Marland Kitchen BREO ELLIPTA 200-25 MCG/INH AEPB INHALE 1 PUFF DAILY AS DIRECTED 60 each 5  . cholecalciferol (VITAMIN D) 1000 units tablet Take 2,000 Units by mouth daily.    . Probiotic Product (PROBIOTIC PO) Take by mouth.    . sertraline (ZOLOFT) 25 MG tablet Take 1 tablet (25 mg total) by mouth daily. 90 tablet 1   No current facility-administered medications on file prior to visit.    BP 120/64   Pulse 95   Temp 98 F (36.7 C) (Oral)   Ht 5\' 3"  (1.6 m)   Wt 129 lb (58.5 kg)   LMP  (LMP Unknown)   SpO2 93%   BMI 22.85 kg/m       Objective:   Physical Exam Vitals and nursing note reviewed.  Constitutional:      Appearance: Normal appearance.  Cardiovascular:     Pulses: Normal pulses.     Heart sounds: Normal heart sounds.  Pulmonary:     [place]: Pulmonary effort is normal.     Breath sounds: Normal breath sounds.  Skin:    General: Skin is warm and dry.     Capillary Refill: Capillary refill takes less than 2 seconds.     Findings: Erythema and rash present. Rash is purpuric.  Neurological:     General: No focal deficit present.     Mental Status: She is alert and oriented to person, place, and time.   Psychiatric:        Mood and Affect: Mood normal.        Behavior: Behavior normal.        Thought Content: Thought content normal.        Judgment: Judgment normal.       Assessment &  Plan:  1. Rash - Does not appear as scabies.folliculitis/MRSA. Will start with triamicinolone/Eucerin cream. Consider biopsy if not improving  - triamcinolone 0.5%-Eucerin equivalent 1:1 cream mixture; Apply topically 2 (two) times daily.  Dispense: 454 g; Refill: 2   [name], NP

## 2021-04-08 ENCOUNTER — Telehealth: Payer: Self-pay

## 2021-04-08 DIAGNOSIS — L738 Other specified follicular disorders: Secondary | ICD-10-CM | POA: Diagnosis not present

## 2021-04-08 DIAGNOSIS — L089 Local infection of the skin and subcutaneous tissue, unspecified: Secondary | ICD-10-CM | POA: Diagnosis not present

## 2021-04-08 DIAGNOSIS — L309 Dermatitis, unspecified: Secondary | ICD-10-CM | POA: Diagnosis not present

## 2021-04-08 DIAGNOSIS — L281 Prurigo nodularis: Secondary | ICD-10-CM | POA: Diagnosis not present

## 2021-04-08 DIAGNOSIS — D225 Melanocytic nevi of trunk: Secondary | ICD-10-CM | POA: Diagnosis not present

## 2021-04-08 NOTE — Telephone Encounter (Signed)
This patient called this morning and stated that she broke out in a rash all over her body. That started a couple of months ago.  The rash is very itchy, big red bumps.  There is no drainage until she scratches them and then its a scant amount of clear drainage.    She saw Dr. Jari Pigg this morning at [facility] and she feels that it may possibly be Lymphoma related.  Dr. Delman Cheadle sent off two biopsy samples and are expecting the results to come back in about 2 weeks.  She gave her something for the itching and she could not remember the name at the time.  Patient also request that a copy of her medical record be sent to Dr Victorino December office.  This nurse informed patient that this information would be forwarded to Dr. Alvy Bimler.  No further questions or concerns noted.

## 2021-04-09 ENCOUNTER — Telehealth: Payer: Self-pay

## 2021-04-09 NOTE — Telephone Encounter (Signed)
Called and left a message for office staff at Dr. Delman Cheadle, dermatology asking them to fax recent office note to Dr. Alvy Bimler office. Left fax # and office #.

## 2021-04-10 ENCOUNTER — Encounter: Payer: Self-pay | Admitting: Hematology and Oncology

## 2021-04-17 DIAGNOSIS — Z20822 Contact with and (suspected) exposure to covid-19: Secondary | ICD-10-CM | POA: Diagnosis not present

## 2021-04-20 ENCOUNTER — Other Ambulatory Visit: Payer: Self-pay

## 2021-04-20 ENCOUNTER — Inpatient Hospital Stay: Payer: Medicare Other | Attending: Hematology and Oncology | Admitting: Hematology and Oncology

## 2021-04-20 ENCOUNTER — Other Ambulatory Visit: Payer: Self-pay | Admitting: Hematology and Oncology

## 2021-04-20 ENCOUNTER — Inpatient Hospital Stay: Payer: Medicare Other

## 2021-04-20 ENCOUNTER — Encounter: Payer: Self-pay | Admitting: Hematology and Oncology

## 2021-04-20 ENCOUNTER — Telehealth: Payer: Self-pay

## 2021-04-20 VITALS — BP 155/59 | HR 67 | Temp 97.4°F | Resp 18 | Ht 63.0 in | Wt 130.0 lb

## 2021-04-20 DIAGNOSIS — E538 Deficiency of other specified B group vitamins: Secondary | ICD-10-CM | POA: Diagnosis not present

## 2021-04-20 DIAGNOSIS — L739 Follicular disorder, unspecified: Secondary | ICD-10-CM | POA: Insufficient documentation

## 2021-04-20 DIAGNOSIS — C8588 Other specified types of non-Hodgkin lymphoma, lymph nodes of multiple sites: Secondary | ICD-10-CM

## 2021-04-20 DIAGNOSIS — R21 Rash and other nonspecific skin eruption: Secondary | ICD-10-CM | POA: Diagnosis not present

## 2021-04-20 DIAGNOSIS — Z8572 Personal history of non-Hodgkin lymphomas: Secondary | ICD-10-CM | POA: Diagnosis not present

## 2021-04-20 LAB — CBC WITH DIFFERENTIAL/PLATELET
Abs Immature Granulocytes: 0.02 10*3/uL (ref 0.00–0.07)
Basophils Absolute: 0.1 10*3/uL (ref 0.0–0.1)
Basophils Relative: 1 %
Eosinophils Absolute: 0.3 10*3/uL (ref 0.0–0.5)
Eosinophils Relative: 5 %
HCT: 41.1 % (ref 36.0–46.0)
Hemoglobin: 13.2 g/dL (ref 12.0–15.0)
Immature Granulocytes: 0 %
Lymphocytes Relative: 28 %
Lymphs Abs: 1.9 10*3/uL (ref 0.7–4.0)
MCH: 32.2 pg (ref 26.0–34.0)
MCHC: 32.1 g/dL (ref 30.0–36.0)
MCV: 100.2 fL — ABNORMAL HIGH (ref 80.0–100.0)
Monocytes Absolute: 0.8 10*3/uL (ref 0.1–1.0)
Monocytes Relative: 11 %
Neutro Abs: 3.7 10*3/uL (ref 1.7–7.7)
Neutrophils Relative %: 55 %
Platelets: 269 10*3/uL (ref 150–400)
RBC: 4.1 MIL/uL (ref 3.87–5.11)
RDW: 14.1 % (ref 11.5–15.5)
WBC: 6.8 10*3/uL (ref 4.0–10.5)
nRBC: 0 % (ref 0.0–0.2)

## 2021-04-20 LAB — COMPREHENSIVE METABOLIC PANEL
ALT: 9 U/L (ref 0–44)
AST: 20 U/L (ref 15–41)
Albumin: 3.8 g/dL (ref 3.5–5.0)
Alkaline Phosphatase: 124 U/L (ref 38–126)
Anion gap: 10 (ref 5–15)
BUN: 17 mg/dL (ref 8–23)
CO2: 29 mmol/L (ref 22–32)
Calcium: 10.1 mg/dL (ref 8.9–10.3)
Chloride: 104 mmol/L (ref 98–111)
Creatinine, Ser: 0.81 mg/dL (ref 0.44–1.00)
GFR, Estimated: >60 mL/min (ref >60)
Glucose, Bld: 81 mg/dL (ref 70–99)
Potassium: 4.2 mmol/L (ref 3.5–5.1)
Sodium: 143 mmol/L (ref 135–145)
Total Bilirubin: 0.5 mg/dL (ref 0.3–1.2)
Total Protein: 6.8 g/dL (ref 6.5–8.1)

## 2021-04-20 LAB — VITAMIN B12: Vitamin B-12: 162 pg/mL — ABNORMAL LOW (ref 180–914)

## 2021-04-20 MED ORDER — CYANOCOBALAMIN 1000 MCG/ML IJ SOLN
INTRAMUSCULAR | Status: AC
  Filled 2021-04-20: qty 1

## 2021-04-20 MED ORDER — CYANOCOBALAMIN 1000 MCG/ML IJ SOLN
1000.0000 ug | Freq: Once | INTRAMUSCULAR | Status: AC
  Administered 2021-04-20: 1000 ug via INTRAMUSCULAR

## 2021-04-20 NOTE — Assessment & Plan Note (Signed)
She is prescribed doxycycline for folliculitis She had recent biopsy of her skin I do not have copy of the test results I will defer to her dermatologist for management

## 2021-04-20 NOTE — Telephone Encounter (Signed)
-----   Message from [name], MD sent at 04/20/2021  2:55 PM EDT ----- Brooke Washington,  Call her or her husband Her B12 is low She needs to resume monthly injection I will place LOS for her to get injection monthly

## 2021-04-20 NOTE — Telephone Encounter (Signed)
Attempted to call pt to inform. No answer. LVM for pt to return call to [facility].

## 2021-04-20 NOTE — Assessment & Plan Note (Signed)
She is doing well with vitamin B12 injections Repeat B12 level is pending, I will call her with test results I will check vitamin B12 again in her next visit

## 2021-04-20 NOTE — Assessment & Plan Note (Signed)
She has no signs of cancer recurrence I plan to see her in 6 months for further follow-up I explained to the husband that there is no benefit for routine surveillance imaging in the absence of symptoms

## 2021-04-20 NOTE — Progress Notes (Signed)
[facility] OFFICE PROGRESS NOTE  Patient Care Team: [name], NP as PCP - General (Family Medicine)  ASSESSMENT & PLAN:  Large cell lymphoma of multiple sites [facility]) She has no signs of cancer recurrence I plan to see her in 6 months for further follow-up I explained to the husband that there is no benefit for routine surveillance imaging in the absence of symptoms  Skin rash She is prescribed doxycycline for folliculitis She had recent biopsy of her skin I do not have copy of the test results I will defer to her dermatologist for management  Vitamin B12 deficiency She is doing well with vitamin B12 injections Repeat B12 level is pending, I will call her with test results I will check vitamin B12 again in her next visit  No orders of the defined types were placed in this encounter.   All questions were answered. The patient knows to call the clinic with any problems, questions or concerns. The total time spent in the appointment was 20 minutes encounter with patients including review of chart and various tests results, discussions about plan of care and coordination of care plan   [name], MD 04/20/2021 1:01 PM  INTERVAL HISTORY: Please see below for problem oriented charting. 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 She was treated with chlorambucil in 2000.    --2007 - 09/2008.  Treated with single agent Rituxan.   --12/2008.  She had progressive adenopathy in chest and abdomen and repeat biopsy showed low grade follicular and diffuse B cell NHL.   --01/2009 - 07/2009. She required left ureteral stent and was treated with Treanda/Rituxan x 5 cycles from 02-2009 thru 06-2009.     09/16/2014 Imaging   No lymphadenopathy in the chest, abdomen, or pelvis. The small right axillary lymph nodes seen on the previous exam are unchanged in the 18 month interval. Small to moderate hiatal hernia.    11/20/2017 Imaging   Mildly displaced and angulated pathologic fracture of the proximal humeral shaft. 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      11/22/2017 PET scan   New hypermetabolic lymphadenopathy within the chest and abdomen, consistent with recurrent lymphoma. New large hypermetabolic mass in the right hepatic lobe, consistent with lymphoma this involvement. New small hypermetabolic mass in the left kidney, which may be due to lymphomatous involvement although primary renal cell carcinoma cannot be excluded. Hypermetabolic mass in the right humerus with pathologic fracture, consistent with lymphomatous involvement. Small hypermetabolic foci in right posterior chest wall, also suspicious for recurrent lymphoma.    11/23/2017 Procedure   Bone, fragment(s), Right Humeral Shaft Reamings - ATYPICAL LYMPHOID INFILTRATE CONSISTENT WITH NON-HODGKIN'S B CELL LYMPHOMA. - SEE ONCOLOGY TABLE. Microscopic Comment LYMPHOMA Histologic type: Non-Hodgkins lymphoma, favor large cell type. Grade (if applicable): Favor high grade. Flow cytometry: No specimen is available for analysis. Immunohistochemical stains: BCL-2, BCL-6, CD3, CD5, CD10, CD15, CD20, CD30, CD34, CD43, LCA, CD79a, CD138, Ki-67, PAX-5  and TdT with appropriate controls as performed on block 1C. Touch preps/imprints: Not performed. Comments: The sections show multiple,  primarily soft tissue fragments displaying a dense lymphoid infiltrate characterized by a mixture of small round to slightly irregular lymphocytes, histiocytes, and large centroblastic appearing lymphoid cells with partially clumped to vesicular chromatin, small nucleoli and amphophilic to clear cytoplasm. The number of admixed large lymphoid cells is variable but they appear relatively abundant in some areas associated with clustering. No material is available for flow cytometric analysis. Hence, a large batter of immunohistochemical stains were performed and show a mixture of T and B cells but with a significant B cell component mostly composed of large lymphoid cells with positivity for CD79a, PAX-5, CD30, BCL-2, and BCL-6. LCA shows diffuse staining although some of the large lymphoid cells appear negative. No significant staining is seen with CD20, CD10, CD15, CD34, TdT, or CD138. The lack of CD20 expression in this material is possibly related to prior treatment. There is variable increased expression of Ki-67 ranging from 10 to >50% in some areas particularly where there is abundance of large lymphoid cells. There is admixed abundant T cell population in the background as seen with CD3, CD5, and CD43 with on apparent co-expression of CD5 in B cell areas. The overall findings are consistent with involvement by non-Hodgkin's B cell lymphoma and the presence of relative abundance of large lymphoid B-cells and increased Ki-67 expression favor a high grade large B cell lymphoma.    11/23/2017 Surgery   1. CPT 24516-Intramedullary nailing of right humerus fracture 2. CPT 20245-Bone biopsy, deep     11/23/2017 - 11/25/2017 Hospital Admission   She was admitted to the hospital for management of humerus fracture and aggressive lymphoma    11/24/2017 Imaging   ECHO showed LV EF: 65% -   70%    11/25/2017 Procedure   Status post ultrasound-guided biopsy of liver mass. Tissue specimen sent to pathology for  complete histopathologic analysis    12/07/2017 Imaging   Successful placement of a right internal jugular approach power injectable Port-A-Cath. The catheter is ready for immediate use.    12/09/2017 - 03/29/2018 Chemotherapy   The patient had R-CHOP chemo      12/17/2017 - 12/20/2017 Hospital Admission   She was hospitalized for influenza infection    01/03/2018 Procedure   Unsuccessful initial L4-5 LP. Successful L2-3 lumbar puncture with installation of methotrexate.    01/24/2018 Procedure   Non complicated injection of intrathecal methotrexate, as above.    02/09/2018 Imaging   LV EF: 55% -   60%    02/10/2018 PET scan   1. Interval response to therapy. There has been resolution of previous hypermetabolic tumor within the chest and abdomen. Deauville criteria 2 and 3. 2. Diffuse radiotracer uptake is identified within the axial and appendicular skeleton which is favored to represent treatment related changes. 3. Significant decrease in FDG uptake associated with previously noted hypermetabolic tumor involving the proximal humeral diaphysis. The FDG uptake within this area on today's study is equivalent to that which is seen in the lumbar spine. 4. Aortic Atherosclerosis (ICD10-I70.0). Lad coronary artery calcifications. 5. Hiatal hernia    02/15/2018 Procedure   Fluoroscopic guided lumbar puncture and intrathecal injection of chemotherapy without complication.    03/31/2018 Procedure   Technically successful fluoroscopic guided lumbar puncture for intrathecal methotrexate injection. Procedure was well tolerated without evidence for immediate complications. Patient will recover in short stay prior to being discharged home  05/08/2018 PET scan   No residual/persistent enlarged or hypermetabolic lymph nodes in the neck, chest, abdomen or pelvis. Findings suggest an excellent response to treatment.    05/25/2018 -  Chemotherapy   The patient had Revlimid for maintenance chemo      06/07/2018 Imaging   LV EF: 55% -   60%    09/25/2018 PET scan   1. Stable exam. No hypermetabolic lymphadenopathy in the neck, chest, abdomen, or pelvis. 2.  Aortic Atherosclerois (ICD10-170.0) 3.  Emphysema. (ICD10-J43.9)      04/09/2019 Imaging   1. No CT findings without contrast to suggest recurrent lymphoma involving the chest, abdomen or pelvis. 2. Stable advanced atherosclerotic calcifications involving the thoracic and abdominal aorta and branch vessels. 3. Moderate to large hiatal hernia. 4. No significant bony findings.   10/08/2019 Imaging   1. No CT findings to suggest recurrent lymphoma. No lymphadenopathy in the lower neck, chest, abdomen, pelvis or inguinal regions. 2. Stable advanced atherosclerotic calcifications involving the thoracic and abdominal aorta and branch vessels. 3. Moderate to large hiatal hernia. 4. Stable sclerotic bone lesions involving the right ischium and right hip. New left total hip arthroplasty noted.     10/20/2020 Imaging   1. No acute process or evidence of active lymphoma within the chest, abdomen, or pelvis. 2. Degraded evaluation of the pelvis, secondary to beam hardening artifact from left hip arthroplasty. 3. Slight increased calcification within the hepatic dome, likely dystrophic at the site of a dominant mass back on the CT of 12/24/2017. Recommend attention on follow-up. 4. Aortic atherosclerosis (ICD10-I70.0), coronary artery atherosclerosis and emphysema (ICD10-J43.9).     REVIEW OF SYSTEMS:   Constitutional: Denies fevers, chills or abnormal weight loss Eyes: Denies blurriness of vision Ears, nose, mouth, throat, and face: Denies mucositis or sore throat Respiratory: Denies cough, dyspnea or wheezes Cardiovascular: Denies palpitation, chest discomfort or lower extremity swelling Gastrointestinal:  Denies nausea, heartburn or change in bowel habits Lymphatics: Denies new lymphadenopathy or easy bruising Neurological:Denies  numbness, tingling or new weaknesses Behavioral/Psych: Mood is stable, no new changes  All other systems were reviewed with the patient and are negative.  I have reviewed the past medical history, past surgical history, social history and family history with the patient and they are unchanged from previous note.  ALLERGIES:  is allergic to contrast media [iodinated diagnostic agents], iohexol, povidone-iodine, penicillins, and adhesive [tape].  MEDICATIONS:  Current Outpatient Medications  Medication Sig Dispense Refill   doxycycline (VIBRAMYCIN) 100 MG capsule Take 100 mg by mouth daily.     albuterol (PROVENTIL HFA;VENTOLIN HFA) 108 (90 Base) MCG/ACT inhaler Inhale 2 puffs into the lungs every 6 (six) hours as needed for wheezing or shortness of breath. 1 Inhaler 0   aspirin EC 81 MG tablet Take 81 mg by mouth daily.     BREO ELLIPTA 200-25 MCG/INH AEPB INHALE 1 PUFF DAILY AS DIRECTED 60 each 5   cholecalciferol (VITAMIN D) 1000 units tablet Take 2,000 Units by mouth daily.     Probiotic Product (PROBIOTIC PO) Take by mouth.     sertraline (ZOLOFT) 25 MG tablet Take 1 tablet (25 mg total) by mouth daily. 90 tablet 1   triamcinolone 0.5%-Eucerin equivalent 1:1 cream mixture Apply topically 2 (two) times daily. 454 g 2   No current facility-administered medications for this visit.    PHYSICAL EXAMINATION: ECOG PERFORMANCE STATUS: 0 - Asymptomatic  Vitals:   04/20/21 0922  BP: (!) 155/59  Pulse: 67  Resp: 18  Temp: (!) 97.4 F (36.3 C)  SpO2: 96%   Filed Weights   04/20/21 0922  Weight: 130 lb (59 kg)    GENERAL:alert, no distress and comfortable SKIN: Noted diffuse skin rash EYES: normal, Conjunctiva are pink and non-injected, sclera clear OROPHARYNX:no exudate, no erythema and lips, buccal mucosa, and tongue normal  NECK: supple, thyroid normal size, non-tender, without nodularity LYMPH:  no palpable lymphadenopathy in the cervical, axillary or inguinal LUNGS: clear to  auscultation and percussion with normal breathing effort HEART: regular rate & rhythm and no murmurs and no lower extremity edema ABDOMEN:abdomen soft, non-tender and normal bowel sounds Musculoskeletal:no cyanosis of digits and no clubbing  NEURO: alert & oriented x 3 with fluent speech, no focal motor/sensory deficits  LABORATORY DATA:  I have reviewed the data as listed    Component Value Date/Time   NA 143 04/20/2021 0848   NA 142 12/28/2016 1002   K 4.2 04/20/2021 0848   K 4.6 12/28/2016 1002   CL 104 04/20/2021 0848   CL 101 03/28/2013 0826   CO2 29 04/20/2021 0848   CO2 29 12/28/2016 1002   GLUCOSE 81 04/20/2021 0848   GLUCOSE 85 12/28/2016 1002   GLUCOSE 84 03/28/2013 0826   BUN 17 04/20/2021 0848   BUN 20.3 12/28/2016 1002   CREATININE 0.81 04/20/2021 0848   CREATININE 0.78 05/03/2019 1019   CREATININE 0.9 12/28/2016 1002   CALCIUM 10.1 04/20/2021 0848   CALCIUM 10.1 12/28/2016 1002   PROT 6.8 04/20/2021 0848   PROT 6.8 12/28/2016 1002   ALBUMIN 3.8 04/20/2021 0848   ALBUMIN 4.2 12/28/2016 1002   AST 20 04/20/2021 0848   AST 12 (L) 05/03/2019 1019   AST 20 12/28/2016 1002   ALT 9 04/20/2021 0848   ALT 10 05/03/2019 1019   ALT 12 12/28/2016 1002   ALKPHOS 124 04/20/2021 0848   ALKPHOS 111 12/28/2016 1002   BILITOT 0.5 04/20/2021 0848   BILITOT 0.3 05/03/2019 1019   BILITOT 0.55 12/28/2016 1002   GFRNONAA >60 04/20/2021 0848   GFRNONAA >60 05/03/2019 1019   GFRAA >60 05/20/2020 0928   GFRAA >60 05/03/2019 1019    No results found for: SPEP, UPEP  Lab Results  Component Value Date   WBC 6.8 04/20/2021   NEUTROABS 3.7 04/20/2021   HGB 13.2 04/20/2021   HCT 41.1 04/20/2021   MCV 100.2 (H) 04/20/2021   PLT 269 04/20/2021      Chemistry      Component Value Date/Time   NA 143 04/20/2021 0848   NA 142 12/28/2016 1002   K 4.2 04/20/2021 0848   K 4.6 12/28/2016 1002   CL 104 04/20/2021 0848   CL 101 03/28/2013 0826   CO2 29 04/20/2021 0848   CO2  29 12/28/2016 1002   BUN 17 04/20/2021 0848   BUN 20.3 12/28/2016 1002   CREATININE 0.81 04/20/2021 0848   CREATININE 0.78 05/03/2019 1019   CREATININE 0.9 12/28/2016 1002      Component Value Date/Time   CALCIUM 10.1 04/20/2021 0848   CALCIUM 10.1 12/28/2016 1002   ALKPHOS 124 04/20/2021 0848   ALKPHOS 111 12/28/2016 1002   AST 20 04/20/2021 0848   AST 12 (L) 05/03/2019 1019   AST 20 12/28/2016 1002   ALT 9 04/20/2021 0848   ALT 10 05/03/2019 1019   ALT 12 12/28/2016 1002   BILITOT 0.5 04/20/2021 0848   BILITOT 0.3 05/03/2019 1019   BILITOT 0.55 12/28/2016 1002

## 2021-04-21 ENCOUNTER — Telehealth: Payer: Self-pay | Admitting: Hematology and Oncology

## 2021-04-21 NOTE — Telephone Encounter (Signed)
Scheduled appointment per 07/11 sch msg. Left message.

## 2021-04-23 ENCOUNTER — Ambulatory Visit: Payer: Medicare Other | Admitting: Physician Assistant

## 2021-05-11 ENCOUNTER — Other Ambulatory Visit: Payer: Self-pay | Admitting: Adult Health

## 2021-05-11 DIAGNOSIS — J449 Chronic obstructive pulmonary disease, unspecified: Secondary | ICD-10-CM

## 2021-05-11 DIAGNOSIS — F32A Depression, unspecified: Secondary | ICD-10-CM

## 2021-05-21 ENCOUNTER — Other Ambulatory Visit: Payer: Self-pay

## 2021-05-21 ENCOUNTER — Ambulatory Visit: Payer: Medicare Other

## 2021-05-21 ENCOUNTER — Inpatient Hospital Stay: Payer: Medicare Other

## 2021-05-21 ENCOUNTER — Inpatient Hospital Stay: Payer: Medicare Other | Attending: Hematology and Oncology

## 2021-05-21 DIAGNOSIS — Z8572 Personal history of non-Hodgkin lymphomas: Secondary | ICD-10-CM | POA: Diagnosis not present

## 2021-05-21 DIAGNOSIS — E538 Deficiency of other specified B group vitamins: Secondary | ICD-10-CM | POA: Insufficient documentation

## 2021-05-21 DIAGNOSIS — C8588 Other specified types of non-Hodgkin lymphoma, lymph nodes of multiple sites: Secondary | ICD-10-CM

## 2021-05-21 MED ORDER — CYANOCOBALAMIN 1000 MCG/ML IJ SOLN
1000.0000 ug | Freq: Once | INTRAMUSCULAR | Status: AC
Start: 2021-05-21 — End: 2021-05-21
  Administered 2021-05-21: 1000 ug via INTRAMUSCULAR
  Filled 2021-05-21: qty 1

## 2021-06-15 ENCOUNTER — Ambulatory Visit (INDEPENDENT_AMBULATORY_CARE_PROVIDER_SITE_OTHER): Payer: Medicare Other

## 2021-06-15 ENCOUNTER — Other Ambulatory Visit: Payer: Self-pay

## 2021-06-15 DIAGNOSIS — Z0000 Encounter for general adult medical examination without abnormal findings: Secondary | ICD-10-CM

## 2021-06-15 NOTE — Progress Notes (Signed)
I connected with  Brooke Washington on 06/15/21 by a video enabled telemedicine application and verified that I am speaking with the correct person using two identifiers.   I discussed the limitations of evaluation and management by telemedicine. The patient expressed understanding and agreed to proceed.  Location of Patient: Home Location of Provider: Office      Subjective:   Brooke Washington is a 80 y.o. female who presents for Medicare Annual (Subsequent) preventive examination.   Review of Systems     Cardiac Risk Factors include: none     Objective:    There were no vitals filed for this visit. There is no height or weight on file to calculate BMI.  Advanced Directives 05/02/2019 04/14/2019 04/09/2019 04/19/2018 03/31/2018 02/15/2018 01/24/2018  Does Patient Have a Medical Advance Directive? Yes Yes No Yes No Yes No  Type of Programmer, systems - Living will - Healthcare Power of [place];Living will [place];Living will  Does patient want to make changes to medical advance directive? No - Patient declined No - Patient declined - No - Patient declined - No - Patient declined -  Copy of [place] in Chart? No - copy requested No - copy requested - - - No - copy requested No - copy requested  Would patient like information on creating a medical advance directive? No - Guardian declined No - Guardian declined No - Patient declined - No - Patient declined - -    Current Medications (verified) Outpatient Encounter Medications as of 06/15/2021  Medication Sig   albuterol (PROVENTIL HFA;VENTOLIN HFA) 108 (90 Base) MCG/ACT inhaler Inhale 2 puffs into the lungs every 6 (six) hours as needed for wheezing or shortness of breath.   aspirin EC 81 MG tablet Take 81 mg by mouth daily.   BREO ELLIPTA 200-25 MCG/INH AEPB INHALE 1 PUFF DAILY AS DIRECTED   cholecalciferol (VITAMIN D) 1000 units tablet Take  2,000 Units by mouth daily.   doxycycline (VIBRAMYCIN) 100 MG capsule Take 100 mg by mouth daily.   Probiotic Product (PROBIOTIC PO) Take by mouth.   sertraline (ZOLOFT) 25 MG tablet TAKE 1 TABLET BY MOUTH DAILY   triamcinolone 0.5%-Eucerin equivalent 1:1 cream mixture Apply topically 2 (two) times daily.   No facility-administered encounter medications on file as of 06/15/2021.    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   FRACTURE SURGERY     IR FLUORO GUIDE PORT INSERTION RIGHT  12/07/2017   IR REMOVAL TUN ACCESS W/ PORT W/O FL MOD SED  11/04/2020   IR US GUIDE VASC ACCESS RIGHT  12/07/2017   ORIF HUMERUS FRACTURE Right 11/23/2017   Procedure: OPEN REDUCTION INTERNAL FIXATION (ORIF) HUMERAL SHAFT FRACTURE;  Surgeon: Shona Needles, MD;  Location: [facility];  Service: Orthopedics;  Laterality: Right;   RIGHT OOPHORECTOMY  1976   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  Procedure: TOTAL HIP ARTHROPLASTY ANTERIOR APPROACH LEFT HIP;  Surgeon: Rod Can, MD;  Location: [facility];  Service: Orthopedics;  Laterality: Left;   Family History  Problem Relation Age of Onset   Cancer Mother        colon ca   Social History   Socioeconomic History   Marital status: Married    Spouse name: Not on file   Number of children: Not on file   Years of education: Not on file   Highest education level: Not on file  Occupational History   Not on file   Tobacco Use   Smoking status: Former    Packs/day: 0.25    Years: 51.00    Pack years: 12.75    Types: Cigarettes    Quit date: 03/12/2015    Years since quitting: 6.2   Smokeless tobacco: Never  Vaping Use   Vaping Use: Some days  Substance and Sexual Activity   Alcohol use: Yes    Alcohol/week: 0.0 standard drinks    Comment: 1 glass of wine monthly   Drug use: No   Sexual activity: Not Currently    Birth control/protection: Post-menopausal  Other Topics Concern   Not on file  Social History Narrative   Married 39 years    One daughter who lives in [place], [place] for mens and womens clothing   - 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       Pets: Dog      Diet: Healthy diet, fruits, veggies and fish.    Exercise: None       Social Determinants of Health   Financial Resource Strain: Low Risk    Difficulty of Paying Living Expenses: Not hard at all  Food Insecurity: No Food Insecurity   Worried About Charity fundraiser in the Last Year: Never true   Arboriculturist in the Last Year: Never true  Transportation Needs: No Transportation Needs   Lack of Transportation (Medical): No   Lack of Transportation (Non-Medical): No  Physical Activity: Inactive   Days of Exercise per Week: 0 days   Minutes of Exercise per Session: 0 min  Stress: No Stress Concern Present   Feeling of Stress : Not at all  Social Connections: Moderately Isolated   Frequency of Communication with Friends and Family: Twice a week   Frequency of Social Gatherings with Friends and Family: More than three times a week   Attends Religious Services: Never   Marine scientist or Organizations: No   Attends Music therapist: Never   Marital Status: Married    Tobacco Counseling Counseling given: Not Answered   Clinical Intake:     Pain : No/denies pain     Diabetes: No  What is the last grade  level you completed in school?: college  Diabetic?no  Interpreter Needed?: No      Activities of Daily Living In your present state of health, do you have any difficulty performing the following activities: 06/15/2021 11/04/2020  Hearing? N N  Vision? Y N  Difficulty concentrating or making decisions? N N  Walking or climbing stairs? N N  Dressing or bathing? N N  Doing errands, shopping? N -  Preparing Food and eating ? N -  Using the Toilet? N -  In the past six months, have you accidently leaked  urine? N -  Do you have problems with loss of bowel control? N -  Managing your Medications? N -  Managing your Finances? N -  Housekeeping or managing your Housekeeping? N -  Some recent data might be hidden    Patient Care Team: Brooke Peng, Brooke Washington as PCP - General (Family Medicine)  Indicate any recent Medical Services you may have received from other than [facility] providers in the past year (date may be approximate).     Assessment:   This is a routine wellness examination for [place].  Hearing/Vision screen No results found.  Dietary issues and exercise activities discussed: Current Exercise Habits: The patient does not participate in regular exercise at present   Goals Addressed   None    Depression Screen PHQ 2/9 Scores 06/15/2021 08/19/2020 05/24/2017 04/30/2016 02/07/2015  PHQ - 2 Score 0 0 0 0 0    Fall Risk Fall Risk  06/15/2021 08/19/2020 05/14/2019 05/24/2017 04/30/2016  Falls in the past year? 0 0 (No Data) No No  Comment - - Emmi Telephone Survey: data to providers prior to load - -  Number falls in past yr: - 0 (No Data) - -  Comment - - Emmi Telephone Survey Actual Response =  - -  Risk for fall due to : No Fall Risks - - - -  Follow up Falls evaluation completed - - - -    FALL RISK PREVENTION PERTAINING TO THE HOME:  Any stairs in or around the home? Yes  If so, are there any without handrails? Yes  Home free of loose throw rugs in walkways, pet beds,  electrical cords, etc? No  Adequate lighting in your home to reduce risk of falls? Yes   ASSISTIVE DEVICES UTILIZED TO PREVENT FALLS:  Life alert? No  Use of a cane, walker or w/c? No  Grab bars in the bathroom? No  Shower chair or bench in shower? Yes  Elevated toilet seat or a handicapped toilet? Yes   TIMED UP AND GO:  Was the test performed? No .  Length of time to ambulate 10 feet:  sec.     Cognitive Function:     6CIT Screen 06/15/2021  What Year? 0 points  What month? 0 points  What time? 0 points  Count back from 20 0 points  Months in reverse 0 points  Repeat phrase 4 points  Total Score 4    Immunizations Immunization History  Administered Date(s) Administered   Fluad Quad(high Dose 65+) 08/12/2020   Influenza Split 09/09/2011, 07/07/2012   Influenza Whole 08/24/2007, 07/16/2008, 07/14/2010   Influenza, High Dose Seasonal PF 07/28/2017, 07/24/2018, 07/06/2019   Influenza,inj,Quad PF,6+ Mos 07/19/2013, 06/11/2014, 07/01/2015   Moderna Sars-Covid-2 Vaccination 10/25/2019, 11/22/2019, 06/27/2020   Pneumococcal Conjugate-13 02/07/2015   Pneumococcal Polysaccharide-23 05/22/2010   Td 10/11/1996    TDAP status: Due, Education has been provided regarding the importance of this vaccine. Advised may receive this vaccine at local pharmacy or Health Dept. Aware to provide a copy of the vaccination record if obtained from local pharmacy or Health Dept. Verbalized acceptance and understanding.  Flu Vaccine status: Up to date  Pneumococcal vaccine status: Up to date  Covid-19 vaccine status: Completed vaccines  Qualifies for Shingles Vaccine? No   Zostavax completed No   Shingrix Completed?: No.    Education has been provided regarding the importance of this vaccine. Patient has been advised to call insurance company to determine out of pocket expense if they have not yet  received this vaccine. Advised may also receive vaccine at local pharmacy or Health Dept.  Verbalized acceptance and understanding.  Screening Tests Health Maintenance  Topic Date Due   Zoster Vaccines- Shingrix (1 of 2) Never done   DEXA SCAN  Never done   TETANUS/TDAP  10/11/2006   COVID-19 Vaccine (4 - Booster for Moderna series) 09/26/2020   INFLUENZA VACCINE  05/11/2021   PNA vac Low Risk Adult  Completed   HPV VACCINES  Aged Out    Health Maintenance  Health Maintenance Due  Topic Date Due   Zoster Vaccines- Shingrix (1 of 2) Never done   DEXA SCAN  Never done   TETANUS/TDAP  10/11/2006   COVID-19 Vaccine (4 - Booster for Moderna series) 09/26/2020   INFLUENZA VACCINE  05/11/2021    Colorectal cancer screening: No longer required.   Mammogram status: No longer required due to patient declines referral .  Bone Density status: Ordered 08/19/20. Pt provided with contact info and advised to call to schedule appt.  Lung Cancer Screening: (Low Dose CT Chest recommended if Age 45-80 years, 30 pack-year currently smoking OR have quit w/in 15years.) does not qualify.   Lung Cancer Screening Referral: no  Additional Screening:  Hepatitis C Screening: does not qualify; Completed   Vision Screening: Recommended annual ophthalmology exams for early detection of glaucoma and other disorders of the eye. Is the patient up to date with their annual eye exam?  Yes  Who is the provider or what is the name of the office in which the patient attends annual eye exams? Dr Prudencio Burly If pt is not established with a provider, would they like to be referred to a provider to establish care?  N/A .   Dental Screening: Recommended annual dental exams for proper oral hygiene  Community Resource Referral / Chronic Care Management: CRR required this visit?  No   CCM required this visit?  No      Plan:     I have personally reviewed and noted the following in the patient's chart:   Medical and social history Use of alcohol, tobacco or illicit drugs  Current medications and  supplements including opioid prescriptions.  Functional ability and status Nutritional status Physical activity Advanced directives List of other physicians Hospitalizations, surgeries, and ER visits in previous 12 months Vitals Screenings to include cognitive, depression, and falls Referrals and appointments  In addition, I have reviewed and discussed with patient certain preventive protocols, quality metrics, and best practice recommendations. A written personalized care plan for preventive services as well as general preventive health recommendations were provided to patient.     Brooke Washington, [place]   06/15/2021   Nurse Notes: Non-Face to Face 45 minute visit Encounter.   Ms. Buzby , Thank you for taking time to come for your Medicare Wellness Visit. I appreciate your ongoing commitment to your health goals. Please review the following plan we discussed and let me know if I can assist you in the future.   These are the goals we discussed:  Goals   None     This is a list of the screening recommended for you and due dates:  Health Maintenance  Topic Date Due   Zoster (Shingles) Vaccine (1 of 2) Never done   DEXA scan (bone density measurement)  Never done   Tetanus Vaccine  10/11/2006   COVID-19 Vaccine (4 - Booster for Moderna series) 09/26/2020   Flu Shot  05/11/2021   Pneumonia vaccines  Completed  HPV Vaccine  Aged Out

## 2021-06-22 ENCOUNTER — Ambulatory Visit: Payer: Medicare Other

## 2021-06-22 ENCOUNTER — Inpatient Hospital Stay: Payer: Medicare Other | Attending: Hematology and Oncology

## 2021-06-22 ENCOUNTER — Telehealth: Payer: Self-pay | Admitting: Adult Health

## 2021-06-22 ENCOUNTER — Other Ambulatory Visit: Payer: Self-pay

## 2021-06-22 DIAGNOSIS — E538 Deficiency of other specified B group vitamins: Secondary | ICD-10-CM | POA: Diagnosis not present

## 2021-06-22 DIAGNOSIS — C8588 Other specified types of non-Hodgkin lymphoma, lymph nodes of multiple sites: Secondary | ICD-10-CM

## 2021-06-22 MED ORDER — CYANOCOBALAMIN 1000 MCG/ML IJ SOLN
1000.0000 ug | Freq: Once | INTRAMUSCULAR | Status: AC
  Administered 2021-06-22: 1000 ug via INTRAMUSCULAR
  Filled 2021-06-22: qty 1

## 2021-06-22 NOTE — Telephone Encounter (Signed)
Brooke Washington  Did patient have AWV on 06/15/21.  It looks like it has not billed yet  Thanks Shirlean Mylar

## 2021-07-21 ENCOUNTER — Inpatient Hospital Stay: Payer: Medicare Other | Attending: Hematology and Oncology

## 2021-07-21 ENCOUNTER — Other Ambulatory Visit: Payer: Self-pay

## 2021-07-21 ENCOUNTER — Ambulatory Visit: Payer: Medicare Other

## 2021-07-21 DIAGNOSIS — E538 Deficiency of other specified B group vitamins: Secondary | ICD-10-CM | POA: Insufficient documentation

## 2021-07-21 DIAGNOSIS — C8588 Other specified types of non-Hodgkin lymphoma, lymph nodes of multiple sites: Secondary | ICD-10-CM

## 2021-07-21 MED ORDER — CYANOCOBALAMIN 1000 MCG/ML IJ SOLN
1000.0000 ug | Freq: Once | INTRAMUSCULAR | Status: AC
  Administered 2021-07-21: 1000 ug via INTRAMUSCULAR
  Filled 2021-07-21: qty 1

## 2021-07-30 ENCOUNTER — Ambulatory Visit (INDEPENDENT_AMBULATORY_CARE_PROVIDER_SITE_OTHER): Payer: Medicare Other

## 2021-07-30 ENCOUNTER — Encounter: Payer: Self-pay | Admitting: Adult Health

## 2021-07-30 ENCOUNTER — Telehealth: Payer: Self-pay | Admitting: *Deleted

## 2021-07-30 ENCOUNTER — Other Ambulatory Visit: Payer: Self-pay

## 2021-07-30 ENCOUNTER — Ambulatory Visit (INDEPENDENT_AMBULATORY_CARE_PROVIDER_SITE_OTHER): Payer: Medicare Other | Admitting: Adult Health

## 2021-07-30 VITALS — BP 124/60 | HR 101 | Temp 97.8°F | Ht 64.0 in | Wt 132.8 lb

## 2021-07-30 DIAGNOSIS — J449 Chronic obstructive pulmonary disease, unspecified: Secondary | ICD-10-CM | POA: Diagnosis not present

## 2021-07-30 DIAGNOSIS — Z23 Encounter for immunization: Secondary | ICD-10-CM | POA: Diagnosis not present

## 2021-07-30 DIAGNOSIS — J9611 Chronic respiratory failure with hypoxia: Secondary | ICD-10-CM

## 2021-07-30 DIAGNOSIS — J439 Emphysema, unspecified: Secondary | ICD-10-CM | POA: Diagnosis not present

## 2021-07-30 MED ORDER — TRELEGY ELLIPTA 100-62.5-25 MCG/ACT IN AEPB: 1.0000 | INHALATION_SPRAY | Freq: Every day | RESPIRATORY_TRACT | 5 refills | Status: DC

## 2021-07-30 MED ORDER — TRELEGY ELLIPTA 100-62.5-25 MCG/ACT IN AEPB: 1.0000 | INHALATION_SPRAY | Freq: Every day | RESPIRATORY_TRACT | 0 refills | Status: DC

## 2021-07-30 NOTE — Assessment & Plan Note (Addendum)
Patient with ongoing exertional hypoxemia.  Patient was noted to have this in 2019.  Unfortunately she did not keep the oxygen and returned it.  Patient now continues to demonstrate exertional hypoxemia on room air with O2 saturations 85%.  She does require Oxygen 2 L to maintain O2 saturations greater than 88 to 9%.  Order for oxygen to begin at 2 L with activity and at bedtime.  Unable to tolerate POC  Plan  Patient Instructions  Stop BREO  Begin TRELEGY 1 puff daily, rinse after use.  Chest xray today .  Refer to Pulmonary Rehab  Begin Oxygen 2l/m with activity and At bedtime   Flu shot today .  Follow up with Dr. Melvyn Novas or Farhaan Mabee NP   In 3 months and As needed   Please contact office for sooner follow up if symptoms do not improve or worsen or seek emergency care     '

## 2021-07-30 NOTE — Progress Notes (Signed)
@Patient  ID: Brooke Washington, female    DOB: 1940/11/29, 80 y.o.   MRN: [id number]  Chief Complaint  Patient presents with   Follow-up    Referring provider: [name], NP  HPI: 80 year old female former smoker followed for moderate COPD and chronic respiratory failure  Started on Oxygen 2019 .  Past medical history significant for large cell lymphoma   TEST/EVENTS :  Spirometry 07/05/2017  FEV1 1.15 (55%)  Ratio 56    - PFT's  08/16/2017  FEV1 1.31 (66 % ) ratio 56  p 17 % improvement from saba p nothing prior to study with DLCO  36/35 % corrects to 47 % for alv volume    CT chest October 20, 2020 shows moderate bullous type emphysema, no acute process or evidence of active lymphoma in the chest abdomen or pelvis.   07/30/2021 Follow up : COPD  Patient presents for a follow-up visit.  Patient was last seen March 2019.  She has been followed in the past for moderate COPD. Last visit in 2019 was started on Oxygen with activity  Patient says since last visit she feels that her breathing has been going down he will.  She gets short of breath with activities has decreased stamina and activity tolerance.  She remains on Breo daily.  Patient says she only used her oxygen for a few months and returned it.  Felt that she did not need it any longer.  However is noticing that she gets short of breath if she walks any distance.  Today in the office on walk she dropped her oxygen at 85% on room air.  She requires 2 L of oxygen to maintain O2 saturations greater than 88 to 90%.  Unable to tolerate pulsed oxygen on POC required continuous flow oxygen to maintain O2 saturations greater than 88 to 90%. Patient says since receiving chemo for lymphoma that she has significant neuropathy which limits her activities. She denies any significant coughing or wheezing.  She denies hemoptysis chest pain or orthopnea.  Allergies  Allergen Reactions   Contrast Media [Iodinated Diagnostic Agents] Shortness  Of Breath, Rash and Other (See Comments)    Throat swells   Iohexol Shortness Of Breath   Povidone-Iodine Hives, Shortness Of Breath and Rash    REACTION: throat swells   Penicillins Hives, Itching and Rash    Has patient had a PCN reaction causing immediate rash, facial/tongue/throat swelling, SOB or lightheadedness with hypotension: Unknown Has patient had a PCN reaction causing severe rash involving mucus membranes or skin necrosis: Yes Has patient had a PCN reaction that required hospitalization: Unknown Has patient had a PCN reaction occurring within the last 10 years: Unknown If all of the above answers are "NO", then may proceed with Cephalosporin use.    Adhesive [Tape] Hives    Immunization History  Administered Date(s) Administered   Fluad Quad(high Dose 65+) 08/12/2020   Influenza Split 09/09/2011, 07/07/2012   Influenza Whole 08/24/2007, 07/16/2008, 07/14/2010   Influenza, High Dose Seasonal PF 07/28/2017, 07/24/2018, 07/06/2019   Influenza,inj,Quad PF,6+ Mos 07/19/2013, 06/11/2014, 07/01/2015   Moderna Sars-Covid-2 Vaccination 10/25/2019, 11/22/2019, 06/27/2020   Pneumococcal Conjugate-13 02/07/2015   Pneumococcal Polysaccharide-23 05/22/2010   Td 10/11/1996    Past Medical History:  Diagnosis Date   Cancer ([place])  APRIL OF 2000   LOW-GRADE NON-HODGKIN'S LYMPHOMA   COPD (chronic obstructive pulmonary disease) ([place])    Depression    Emphysema of lung (HCC)    GERD (gastroesophageal reflux disease)  Hyperlipidemia    S/p left hip fracture 04/15/2019    Tobacco History: Social History   Tobacco Use  Smoking Status Former   Packs/day: 0.25   Years: 51.00   Pack years: 12.75   Types: Cigarettes   Quit date: 03/12/2015   Years since quitting: 6.3  Smokeless Tobacco Never   Counseling given: Not Answered   Outpatient Medications Prior to Visit  Medication Sig Dispense Refill   albuterol (PROVENTIL HFA;VENTOLIN HFA) 108 (90 Base) MCG/ACT inhaler Inhale 2  puffs into the lungs every 6 (six) hours as needed for wheezing or shortness of breath. 1 Inhaler 0   aspirin EC 81 MG tablet Take 81 mg by mouth daily.     cholecalciferol (VITAMIN D) 1000 units tablet Take 2,000 Units by mouth daily.     Probiotic Product (PROBIOTIC PO) Take by mouth.     sertraline (ZOLOFT) 25 MG tablet TAKE 1 TABLET BY MOUTH DAILY 90 tablet 1   triamcinolone 0.5%-Eucerin equivalent 1:1 cream mixture Apply topically 2 (two) times daily. 454 g 2   BREO ELLIPTA 200-25 MCG/INH AEPB INHALE 1 PUFF DAILY AS DIRECTED 60 each 5   doxycycline (VIBRAMYCIN) 100 MG capsule Take 100 mg by mouth daily. (Patient not taking: Reported on 07/30/2021)     No facility-administered medications prior to visit.     Review of Systems:   Constitutional:   No  weight loss, night sweats,  Fevers, chills,  +fatigue, or  lassitude.  HEENT:   No headaches,  Difficulty swallowing,  Tooth/dental problems, or  Sore throat,                No sneezing, itching, ear ache, nasal congestion, post nasal drip,   CV:  No chest pain,  Orthopnea, PND, swelling in lower extremities, anasarca, dizziness, palpitations, syncope.   GI  No heartburn, indigestion, abdominal pain, nausea, vomiting, diarrhea, change in bowel habits, loss of appetite, bloody stools.   Resp:   No chest wall deformity  Skin: no rash or lesions.  GU: no dysuria, change in color of urine, no urgency or frequency.  No flank pain, no hematuria   MS:  No joint pain or swelling.  No decreased range of motion.  No back pain.    Physical Exam  BP 124/60 (BP Location: Left Arm, Patient Position: Sitting, Cuff Size: Normal)   Pulse (!) 101   Temp 97.8 F (36.6 C) (Oral)   Ht 5\' 4"  (1.626 m)   Wt 132 lb 12.8 oz (60.2 kg)   LMP  (LMP Unknown)   SpO2 (!) 85% Comment: on RA, placed on 2L of oxygen, up to 92%  BMI 22.80 kg/m   GEN: A/Ox3; pleasant , NAD   HEENT:  [place]/AT,  NOSE-clear, THROAT-clear, no lesions, no postnasal drip or  exudate noted.   NECK:  Supple w/ fair ROM; no JVD; normal carotid impulses w/o bruits; no thyromegaly or nodules palpated; no lymphadenopathy.    RESP  Clear  P & A; w/o, wheezes/ rales/ or rhonchi. no accessory muscle use, no dullness to percussion  CARD:  RRR, no m/r/g, no peripheral edema, pulses intact, no cyanosis or clubbing.  GI:   Soft & nt; nml bowel sounds; no organomegaly or masses detected.   Musco: Warm bil, no deformities or joint swelling noted.   Neuro: alert, no focal deficits noted.    Skin: Warm, no lesions or rashes    Lab Results:    BMET   BNP No results  found for: BNP  ProBNP No results found for: PROBNP  Imaging:   PFT Results Latest Ref Rng & Units 08/16/2017  FVC-Pre L 2.05  FVC-Predicted Pre % 78  FVC-Post L 2.32  FVC-Predicted Post % 88  Pre FEV1/FVC % % 54  Post FEV1/FCV % % 56  FEV1-Pre L 1.11  FEV1-Predicted Pre % 56  FEV1-Post L 1.31  DLCO uncorrected ml/min/mmHg 8.29  DLCO UNC% % 36  DLCO corrected ml/min/mmHg 8.12  DLCO COR %Predicted % 35  DLVA Predicted % 47  TLC L 5.01  TLC % Predicted % 102  RV % Predicted % 124    No results found for: NITRICOXIDE      Assessment & Plan:   COPD GOLD II Moderate COPD with emphysema with increased symptom burden.  Check chest x-ray today.  Change Breo to triple therapy with Trelegy.  Suspect patient has a component of deconditioning.  Refer to pulmonary rehab   Plan  Patient Instructions  Stop BREO  Begin TRELEGY 1 puff daily, rinse after use.  Chest xray today .  Refer to Pulmonary Rehab  Begin Oxygen 2l/m with activity and At bedtime   Flu shot today .  Follow up with Dr. Melvyn Novas or Quinn Bartling NP   In 3 months and As needed   Please contact office for sooner follow up if symptoms do not improve or worsen or seek emergency care        Chronic respiratory failure with hypoxia [facility]) Patient with ongoing exertional hypoxemia.  Patient was noted to have this in 2019.   Unfortunately she did not keep the oxygen and returned it.  Patient now continues to demonstrate exertional hypoxemia on room air with O2 saturations 85%.  She does require Oxygen 2 L to maintain O2 saturations greater than 88 to 9%.  Order for oxygen to begin at 2 L with activity and at bedtime.  Unable to tolerate POC  Plan  Patient Instructions  Stop BREO  Begin TRELEGY 1 puff daily, rinse after use.  Chest xray today .  Refer to Pulmonary Rehab  Begin Oxygen 2l/m with activity and At bedtime   Flu shot today .  Follow up with Dr. Melvyn Novas or Dillin Lofgren NP   In 3 months and As needed   Please contact office for sooner follow up if symptoms do not improve or worsen or seek emergency care     '     Mirtie Bastyr, NP 07/30/2021

## 2021-07-30 NOTE — Addendum Note (Signed)
Addended by: Vanessa Barbara on: 07/30/2021 05:41 PM   Modules accepted: Orders

## 2021-07-30 NOTE — Patient Instructions (Addendum)
Stop BREO  Begin TRELEGY 1 puff daily, rinse after use.  Chest xray today .  Refer to Pulmonary Rehab  Begin Oxygen 2l/m with activity and At bedtime   Flu shot today .  Follow up with Dr. Melvyn Novas or Lillian Tigges NP   In 3 months and As needed   Please contact office for sooner follow up if symptoms do not improve or worsen or seek emergency care

## 2021-07-30 NOTE — Telephone Encounter (Signed)
Patient requesting to change providers.  Dr. Melvyn Novas and Dr. Verlee Monte, please advise if ok for patient to change providers.  Thank you.

## 2021-07-30 NOTE — Assessment & Plan Note (Addendum)
Moderate COPD with emphysema with increased symptom burden.  Check chest x-ray today.  Change Breo to triple therapy with Trelegy.  Suspect patient has a component of deconditioning.  Refer to pulmonary rehab   Plan  Patient Instructions  Stop BREO  Begin TRELEGY 1 puff daily, rinse after use.  Chest xray today .  Refer to Pulmonary Rehab  Begin Oxygen 2l/m with activity and At bedtime   Flu shot today .  Follow up with Dr. Melvyn Novas or Secret Kristensen NP   In 3 months and As needed   Please contact office for sooner follow up if symptoms do not improve or worsen or seek emergency care

## 2021-07-30 NOTE — Telephone Encounter (Signed)
Fine with me

## 2021-07-31 NOTE — Telephone Encounter (Signed)
Called patient but she did not answer. Left message for her to call back.  

## 2021-08-03 NOTE — Progress Notes (Signed)
ATC x1, LVM to return call.

## 2021-08-04 ENCOUNTER — Encounter (HOSPITAL_COMMUNITY): Payer: Self-pay | Admitting: *Deleted

## 2021-08-04 NOTE — Progress Notes (Signed)
Received referral from Dr. Melvyn Novas for this pt to participate in pulmonary rehab with the diagnosis of Stage II COPD/Moderate. Clinical review of pt follow up appt on  10/20 with Rexene Edison NP Pulmonary office note.  Pt with Covid Risk Score - 4. Pt appropriate for scheduling for Pulmonary rehab.  Will forward to support staff for scheduling when able as there is a wait list and verification of insurance eligibility/benefits with pt consent. Cherre Huger, BSN Cardiac and Training and development officer

## 2021-08-05 NOTE — Progress Notes (Signed)
Called and spoke with patient, advised of results/recommendations per Rexene Edison NP.  She verbalized understanding.  She states that we changed her inhaler to Trelegy and it works great, in the morning her sats are 90+ when she wakes up and during the day her sats are 93-95%.  She has been contacted about having the oxygen set up, but she is wondering if she needs the oxygen now that she started the Trelegy inhaler.  Tammy, please advise.

## 2021-08-06 ENCOUNTER — Telehealth: Payer: Self-pay | Admitting: Adult Health

## 2021-08-06 NOTE — Telephone Encounter (Signed)
Called and spoke with patient to make sure she didn't need anything further. Patient states that she is doing good and feeling much better since starting new inhaler. Advised her to call if anything changes. She expressed understanding. Nothing further needed at this time.       Profitt, Veronia Beets routed conversation to [facility] Triage Pool Yesterday (3:48 PM)   Thelma Barge"  Patient Appointment Schedule Request Pool Yesterday (1:18 PM)   The number you were given is my husband's number. My number is [phone]. Sorry about the mix up,Bob gave you the wrong number.Opps! I probably won't try to call you because when I do I'm put on hold forever. We tried to call you the other day 4 times and were put on hold for a really long time resulting in our hanging up the phone.    Rosana Berger, [place] "Gussie" Yesterday (1:02 PM)   Hello, I just tried calling back at the number you provided. I left a message. You are welcome to call us back at [phone] or send a moe detailed email about what exactly your question is. Thank you!    Profitt, Veronia Beets  [facility] Triage Pool 2 days ago   BP Patient would like to speak to a nurse regarding [employer].   Please advise.       Jearld Fenton H "Gussie"  Patient Appointment Schedule Request Pool 3 days ago   Appointment Request From: Gilford Silvius   With Provider: Rexene Edison, NP [[facility]]   Preferred Date Range: Any   Preferred Times: Any Time   Reason for visit: Office Visit   Comments: Lynelle Smoke, this is Chelsa Stout, husband of Rand Boller, the patient you saw last Thursday.  We had a call from your office this morning but by the time she got to it the phone was off.  She has tried several times to get in touch with you with extremely long waiting times. No response. I have tried once myself. Huey Romans has called to make an appointment but we want to talk to you first. Would you or your  assistant please call her at [phone]?  Thank you

## 2021-08-21 ENCOUNTER — Ambulatory Visit: Payer: Medicare Other

## 2021-08-21 ENCOUNTER — Inpatient Hospital Stay: Payer: Medicare Other | Attending: Hematology and Oncology

## 2021-08-21 ENCOUNTER — Other Ambulatory Visit: Payer: Self-pay

## 2021-08-21 DIAGNOSIS — E538 Deficiency of other specified B group vitamins: Secondary | ICD-10-CM | POA: Insufficient documentation

## 2021-08-21 DIAGNOSIS — C8588 Other specified types of non-Hodgkin lymphoma, lymph nodes of multiple sites: Secondary | ICD-10-CM

## 2021-08-21 MED ORDER — CYANOCOBALAMIN 1000 MCG/ML IJ SOLN
1000.0000 ug | Freq: Once | INTRAMUSCULAR | Status: AC
  Administered 2021-08-21: 1000 ug via INTRAMUSCULAR
  Filled 2021-08-21: qty 1

## 2021-08-21 NOTE — Patient Instructions (Signed)
Vitamin B12 Deficiency ?Vitamin B12 deficiency occurs when the body does not have enough vitamin B12, which is an important vitamin. The body needs this vitamin: ?To make red blood cells. ?To make DNA. This is the genetic material inside cells. ?To help the nerves work properly so they can carry messages from the brain to the body. ?Vitamin B12 deficiency can cause various health problems, such as a low red blood cell count (anemia) or nerve damage. ?What are the causes? ?This condition may be caused by: ?Not eating enough foods that contain vitamin B12. ?Not having enough stomach acid and digestive fluids to properly absorb vitamin B12 from the food that you eat. ?Certain digestive system diseases that make it hard to absorb vitamin B12. These diseases include Crohn's disease, chronic pancreatitis, and cystic fibrosis. ?A condition in which the body does not make enough of a protein (intrinsic factor), resulting in too few red blood cells (pernicious anemia). ?Having a surgery in which part of the stomach or small intestine is removed. ?Taking certain medicines that make it hard for the body to absorb vitamin B12. These medicines include: ?Heartburn medicines (antacids and proton pump inhibitors). ?Certain antibiotic medicines. ?Some medicines that are used to treat diabetes, tuberculosis, gout, or high cholesterol. ?What increases the risk? ?The following factors may make you more likely to develop a B12 deficiency: ?Being older than age 50. ?Eating a vegetarian or vegan diet, especially while you are pregnant. ?Eating a poor diet while you are pregnant. ?Taking certain medicines. ?Having alcoholism. ?What are the signs or symptoms? ?In some cases, there are no symptoms of this condition. If the condition leads to anemia or nerve damage, various symptoms can occur, such as: ?Weakness. ?Fatigue. ?Loss of appetite. ?Weight loss. ?Numbness or tingling in your hands and feet. ?Redness and burning of the  tongue. ?Confusion or memory problems. ?Depression. ?Sensory problems, such as color blindness, ringing in the ears, or loss of taste. ?Diarrhea or constipation. ?Trouble walking. ?If anemia is severe, symptoms can include: ?Shortness of breath. ?Dizziness. ?Rapid heart rate (tachycardia). ?How is this diagnosed? ?This condition may be diagnosed with a blood test to measure the level of vitamin B12 in your blood. You may also have other tests, including: ?A group of tests that measure certain characteristics of blood cells (complete blood count, CBC). ?A blood test to measure intrinsic factor. ?A procedure where a thin tube with a camera on the end is used to look into your stomach or intestines (endoscopy). ?Other tests may be needed to discover the cause of B12 deficiency. ?How is this treated? ?Treatment for this condition depends on the cause. This condition may be treated by: ?Changing your eating and drinking habits, such as: ?Eating more foods that contain vitamin B12. ?Drinking less alcohol or no alcohol. ?Getting vitamin B12 injections. ?Taking vitamin B12 supplements. Your health care provider will tell you which dosage is best for you. ?Follow these instructions at home: ?Eating and drinking ? ?Eat lots of healthy foods that contain vitamin B12, including: ?Meats and poultry. This includes beef, pork, chicken, turkey, and organ meats, such as liver. ?Seafood. This includes clams, rainbow trout, salmon, tuna, and haddock. ?Eggs. ?Cereal and dairy products that are fortified. This means that vitamin B12 has been added to the food. Check the label on the package to see if the food is fortified. ?The items listed above may not be a complete list of recommended foods and beverages. Contact a dietitian for more information. ?General instructions ?Get any   injections that are prescribed by your health care provider. ?Take supplements only as told by your health care provider. Follow the directions carefully. ?Do  not drink alcohol if your health care provider tells you not to. In some cases, you may only be asked to limit alcohol use. ?Keep all follow-up visits as told by your health care provider. This is important. ?Contact a health care provider if: ?Your symptoms come back. ?Get help right away if you: ?Develop shortness of breath. ?Have a rapid heart rate. ?Have chest pain. ?Become dizzy or lose consciousness. ?Summary ?Vitamin B12 deficiency occurs when the body does not have enough vitamin B12. ?The main causes of vitamin B12 deficiency include dietary deficiency, digestive diseases, pernicious anemia, and having a surgery in which part of the stomach or small intestine is removed. ?In some cases, there are no symptoms of this condition. If the condition leads to anemia or nerve damage, various symptoms can occur, such as weakness, shortness of breath, and numbness. ?Treatment may include getting vitamin B12 injections or taking vitamin B12 supplements. Eat lots of healthy foods that contain vitamin B12. ?This information is not intended to replace advice given to you by your health care provider. Make sure you discuss any questions you have with your health care provider. ?Document Revised: 03/25/2021 Document Reviewed: 06/06/2018 ?Elsevier Patient Education ? 2022 Elsevier Inc[employer] ? ?

## 2021-09-21 ENCOUNTER — Other Ambulatory Visit: Payer: Self-pay

## 2021-09-21 ENCOUNTER — Ambulatory Visit: Payer: Medicare Other

## 2021-09-21 ENCOUNTER — Inpatient Hospital Stay: Payer: Medicare Other | Attending: Hematology and Oncology

## 2021-09-21 DIAGNOSIS — E538 Deficiency of other specified B group vitamins: Secondary | ICD-10-CM | POA: Diagnosis not present

## 2021-09-21 DIAGNOSIS — C8588 Other specified types of non-Hodgkin lymphoma, lymph nodes of multiple sites: Secondary | ICD-10-CM

## 2021-09-21 MED ORDER — CYANOCOBALAMIN 1000 MCG/ML IJ SOLN
1000.0000 ug | Freq: Once | INTRAMUSCULAR | Status: AC
  Administered 2021-09-21: 1000 ug via INTRAMUSCULAR
  Filled 2021-09-21: qty 1

## 2021-09-28 DIAGNOSIS — B029 Zoster without complications: Secondary | ICD-10-CM | POA: Diagnosis not present

## 2021-09-28 DIAGNOSIS — Z23 Encounter for immunization: Secondary | ICD-10-CM | POA: Diagnosis not present

## 2021-09-28 NOTE — Telephone Encounter (Signed)
Patient has an appointment with Rexene Edison NP in January, can address at that visit.  Nothing further needed.

## 2021-09-30 ENCOUNTER — Emergency Department (HOSPITAL_COMMUNITY): Payer: Medicare Other

## 2021-09-30 ENCOUNTER — Emergency Department (HOSPITAL_COMMUNITY)
Admission: EM | Admit: 2021-09-30 | Discharge: 2021-10-03 | Disposition: A | Discharge status: Home / Self Care | Payer: Medicare Other | Attending: Emergency Medicine | Admitting: Emergency Medicine

## 2021-09-30 ENCOUNTER — Other Ambulatory Visit: Payer: Self-pay

## 2021-09-30 DIAGNOSIS — R531 Weakness: Secondary | ICD-10-CM | POA: Diagnosis not present

## 2021-09-30 DIAGNOSIS — R4182 Altered mental status, unspecified: Secondary | ICD-10-CM | POA: Insufficient documentation

## 2021-09-30 DIAGNOSIS — S6992XA Unspecified injury of left wrist, hand and finger(s), initial encounter: Secondary | ICD-10-CM | POA: Diagnosis present

## 2021-09-30 DIAGNOSIS — J449 Chronic obstructive pulmonary disease, unspecified: Secondary | ICD-10-CM | POA: Diagnosis not present

## 2021-09-30 DIAGNOSIS — R0602 Shortness of breath: Secondary | ICD-10-CM | POA: Diagnosis not present

## 2021-09-30 DIAGNOSIS — Z87891 Personal history of nicotine dependence: Secondary | ICD-10-CM | POA: Diagnosis not present

## 2021-09-30 DIAGNOSIS — Z20822 Contact with and (suspected) exposure to covid-19: Secondary | ICD-10-CM | POA: Diagnosis not present

## 2021-09-30 DIAGNOSIS — Z7951 Long term (current) use of inhaled steroids: Secondary | ICD-10-CM | POA: Diagnosis not present

## 2021-09-30 DIAGNOSIS — Z859 Personal history of malignant neoplasm, unspecified: Secondary | ICD-10-CM | POA: Insufficient documentation

## 2021-09-30 DIAGNOSIS — M25539 Pain in unspecified wrist: Secondary | ICD-10-CM | POA: Diagnosis not present

## 2021-09-30 DIAGNOSIS — B028 Zoster with other complications: Secondary | ICD-10-CM | POA: Diagnosis not present

## 2021-09-30 DIAGNOSIS — Z96642 Presence of left artificial hip joint: Secondary | ICD-10-CM | POA: Insufficient documentation

## 2021-09-30 DIAGNOSIS — I959 Hypotension, unspecified: Secondary | ICD-10-CM | POA: Diagnosis not present

## 2021-09-30 DIAGNOSIS — R0902 Hypoxemia: Secondary | ICD-10-CM | POA: Diagnosis not present

## 2021-09-30 DIAGNOSIS — M25532 Pain in left wrist: Secondary | ICD-10-CM | POA: Diagnosis not present

## 2021-09-30 DIAGNOSIS — W19XXXA Unspecified fall, initial encounter: Secondary | ICD-10-CM

## 2021-09-30 DIAGNOSIS — S52592A Other fractures of lower end of left radius, initial encounter for closed fracture: Secondary | ICD-10-CM | POA: Diagnosis not present

## 2021-09-30 DIAGNOSIS — Z7982 Long term (current) use of aspirin: Secondary | ICD-10-CM | POA: Insufficient documentation

## 2021-09-30 DIAGNOSIS — J439 Emphysema, unspecified: Secondary | ICD-10-CM | POA: Diagnosis not present

## 2021-09-30 DIAGNOSIS — S52502A Unspecified fracture of the lower end of left radius, initial encounter for closed fracture: Secondary | ICD-10-CM | POA: Diagnosis not present

## 2021-09-30 LAB — CBC WITH DIFFERENTIAL/PLATELET
Abs Immature Granulocytes: 0.02 10*3/uL (ref 0.00–0.07)
Basophils Absolute: 0 10*3/uL (ref 0.0–0.1)
Basophils Relative: 0 %
Eosinophils Absolute: 0 10*3/uL (ref 0.0–0.5)
Eosinophils Relative: 1 %
HCT: 41.6 % (ref 36.0–46.0)
Hemoglobin: 13.6 g/dL (ref 12.0–15.0)
Immature Granulocytes: 0 %
Lymphocytes Relative: 25 %
Lymphs Abs: 1.5 10*3/uL (ref 0.7–4.0)
MCH: 33.3 pg (ref 26.0–34.0)
MCHC: 32.7 g/dL (ref 30.0–36.0)
MCV: 101.7 fL — ABNORMAL HIGH (ref 80.0–100.0)
Monocytes Absolute: 0.9 10*3/uL (ref 0.1–1.0)
Monocytes Relative: 16 %
Neutro Abs: 3.3 10*3/uL (ref 1.7–7.7)
Neutrophils Relative %: 58 %
Platelets: 204 10*3/uL (ref 150–400)
RBC: 4.09 MIL/uL (ref 3.87–5.11)
RDW: 14.4 % (ref 11.5–15.5)
WBC: 5.7 10*3/uL (ref 4.0–10.5)
nRBC: 0 % (ref 0.0–0.2)

## 2021-09-30 LAB — COMPREHENSIVE METABOLIC PANEL
ALT: 16 U/L (ref 0–44)
AST: 27 U/L (ref 15–41)
Albumin: 4.2 g/dL (ref 3.5–5.0)
Alkaline Phosphatase: 109 U/L (ref 38–126)
Anion gap: 8 (ref 5–15)
BUN: 28 mg/dL — ABNORMAL HIGH (ref 8–23)
CO2: 24 mmol/L (ref 22–32)
Calcium: 9 mg/dL (ref 8.9–10.3)
Chloride: 107 mmol/L (ref 98–111)
Creatinine, Ser: 1.17 mg/dL — ABNORMAL HIGH (ref 0.44–1.00)
GFR, Estimated: 47 mL/min — ABNORMAL LOW (ref >60)
Glucose, Bld: 92 mg/dL (ref 70–99)
Potassium: 4.2 mmol/L (ref 3.5–5.1)
Sodium: 139 mmol/L (ref 135–145)
Total Bilirubin: 0.7 mg/dL (ref 0.3–1.2)
Total Protein: 6.8 g/dL (ref 6.5–8.1)

## 2021-09-30 LAB — RESP PANEL BY RT-PCR (FLU A&B, COVID) ARPGX2
Influenza A by PCR: NEGATIVE
Influenza B by PCR: NEGATIVE
SARS Coronavirus 2 by RT PCR: NEGATIVE

## 2021-09-30 LAB — URINALYSIS, ROUTINE W REFLEX MICROSCOPIC
Bacteria, UA: NONE SEEN
Bilirubin Urine: NEGATIVE
Glucose, UA: NEGATIVE mg/dL
Ketones, ur: NEGATIVE mg/dL
Leukocytes,Ua: NEGATIVE
Nitrite: NEGATIVE
Protein, ur: 30 mg/dL — AB
Specific Gravity, Urine: 1.013 (ref 1.005–1.030)
pH: 5 (ref 5.0–8.0)

## 2021-09-30 LAB — BRAIN NATRIURETIC PEPTIDE: B Natriuretic Peptide: 98.6 pg/mL (ref 0.0–100.0)

## 2021-09-30 LAB — TROPONIN I (HIGH SENSITIVITY)
Troponin I (High Sensitivity): 8 ng/L (ref <18)
Troponin I (High Sensitivity): 10 ng/L (ref <18)

## 2021-09-30 MED ORDER — ASPIRIN EC 81 MG PO TBEC
81.0000 mg | DELAYED_RELEASE_TABLET | Freq: Every day | ORAL | Status: DC
  Administered 2021-09-30 – 2021-10-02 (×3): 81 mg via ORAL
  Filled 2021-09-30 (×4): qty 1

## 2021-09-30 MED ORDER — FLUTICASONE FUROATE-VILANTEROL 100-25 MCG/ACT IN AEPB
1.0000 | INHALATION_SPRAY | Freq: Every day | RESPIRATORY_TRACT | Status: DC
  Administered 2021-10-02: 09:00:00 1 via RESPIRATORY_TRACT
  Filled 2021-09-30: qty 28

## 2021-09-30 MED ORDER — ACETAMINOPHEN 500 MG PO TABS
1000.0000 mg | ORAL_TABLET | Freq: Once | ORAL | Status: AC
  Administered 2021-09-30: 16:00:00 1000 mg via ORAL
  Filled 2021-09-30: qty 2

## 2021-09-30 MED ORDER — UMECLIDINIUM BROMIDE 62.5 MCG/ACT IN AEPB
1.0000 | INHALATION_SPRAY | Freq: Every day | RESPIRATORY_TRACT | Status: DC
  Administered 2021-10-02: 09:00:00 1 via RESPIRATORY_TRACT
  Filled 2021-09-30: qty 7

## 2021-09-30 MED ORDER — ALBUTEROL SULFATE HFA 108 (90 BASE) MCG/ACT IN AERS: 2.0000 | INHALATION_SPRAY | Freq: Four times a day (QID) | RESPIRATORY_TRACT | Status: DC | PRN

## 2021-09-30 MED ORDER — FLUTICASONE-UMECLIDIN-VILANT 100-62.5-25 MCG/ACT IN AEPB
1.0000 | INHALATION_SPRAY | Freq: Every day | RESPIRATORY_TRACT | Status: DC
Start: 2021-09-30 — End: 2021-09-30

## 2021-09-30 MED ORDER — SERTRALINE HCL 50 MG PO TABS
25.0000 mg | ORAL_TABLET | Freq: Every day | ORAL | Status: DC
  Administered 2021-09-30 – 2021-10-02 (×3): 25 mg via ORAL
  Filled 2021-09-30 (×3): qty 1

## 2021-09-30 NOTE — ED Provider Notes (Signed)
Patient signed to me by Dr. Vallery Ridge.  Work up here without significant findings.  Patient with negative COVID and flu test.  Urinalysis without significant infection.  Wrist fracture noted and addressed by Dr. Vallery Ridge.  Patient states that she has had progressive weakness.  Will consult TOC for possible rehab placement due to her shingles   Lacretia Leigh, MD 09/30/21 1656

## 2021-09-30 NOTE — ED Notes (Signed)
Pt requesting something to drink and advised that we can not provide anything to eat or drink at this time. ED tech provided pt with mouth sponge. Pt family member at bedside at this time.

## 2021-09-30 NOTE — ED Triage Notes (Signed)
Pt bib ems, pt fell onto left wrist this morning. Denies hitting head and denies and LOC. Per EMS pt family said she has been falling more since starting medication for shingles. Splint applied to left wrist by EMS  EMS Vitals 142/70 82 HR 99% Room Air

## 2021-09-30 NOTE — TOC Initial Note (Signed)
Transition of Care [facility]) - Initial/Assessment Note    Patient Details  Name: Brooke Washington MRN: [id number] Date of Birth: [birth year]  Transition of Care [facility]) CM/SW Contact:    Joanne Chars, LCSW Phone Number: 09/30/2021, 5:52 PM  Clinical Narrative:   CSW met with pt for initial assessment.  Pt lives at home with husband Herbie [place] and granddaughter Baxter Hire.  Daughter Darrick Penna also lives locally.  Permission given to speak with all of them.  Discussed potential SNF/rehab placement.  Pt reports she was at [facility] in the past, has not been to SNF.  Pt is willing to go to SNF or CIR, expressed interest in CIR as she said it was helpful in the past.  CSW discussed that PT will evaluate her and make recommendations and CSW on floor will follow up.  Pt is vaccinated for covid with one booster.                 Expected Discharge Plan: Skilled Nursing Facility Barriers to Discharge: Continued Medical Work up, Other (must enter comment) (pending PT evaluation)   Patient Goals and CMS Choice Patient states their goals for this hospitalization and ongoing recovery are:: "get back to halfway normal"      Expected Discharge Plan and Services Expected Discharge Plan: [place] In-house Referral: Clinical Social Work   Post Acute Care Choice:  (tbd) Living arrangements for the past 2 months: Single Family Home                                      Prior Living Arrangements/Services Living arrangements for the past 2 months: Single Family Home Lives with:: Spouse, Relatives (granddaughter Sophie) Patient language and need for interpreter reviewed:: Yes Do you feel safe going back to the place where you live?: Yes      Need for Family Participation in Patient Care: Yes (Comment) Care giver support system in place?: Yes (comment) Current home services: Other (comment) (none) Criminal Activity/Legal Involvement Pertinent to Current Situation/Hospitalization: No - Comment  as needed  Activities of Daily Living      Permission Sought/Granted Permission sought to share information with : Family Supports Permission granted to share information with : Yes, Verbal Permission Granted  Share Information with NAME: husband Herbie Baltimore, daughter Darrick Penna, granddaughter Sophie           Emotional Assessment Appearance:: Appears stated age Attitude/Demeanor/Rapport: Engaged Affect (typically observed): Appropriate, Pleasant Orientation: :  (not recorded, appears oriented) Alcohol / Substance Use: Not Applicable Psych Involvement: No (comment)  Admission diagnosis:  fall lt wrist pain Patient Active Problem List   Diagnosis Date Noted   Skin rash 04/20/2021   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   Delayed wound healing 10/09/2019   Vitamin B12 deficiency 08/06/2019   Osteoporosis 08/06/2019   Hypomagnesemia    Hypokalemia    Prolonged Q-T interval on ECG    Femur fracture, left ([place]) 04/15/2019   Acute blood loss anemia    Status post total hip replacement, left    Supplemental oxygen dependent    Closed left hip fracture, initial encounter ([place]) 04/10/2019   Leukopenia due to antineoplastic chemotherapy ([place]) 01/25/2019   Bilateral arm pain 12/14/2018   Pancytopenia, acquired ([place]) 06/20/2018   Weight loss 06/20/2018   Physical deconditioning 04/10/2018   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   Chronic respiratory failure with hypoxia ([place]) 12/30/2017   Neutropenic fever (  [place]) 12/18/2017   Thrombocytopenia ([place]) 12/18/2017   Abdominal fullness 12/18/2017   Mucositis due to antineoplastic therapy 12/15/2017   Heart burn 12/15/2017   Frequent headaches 12/15/2017   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   Goals of care, counseling/discussion 11/29/2017   Pathological fracture in neoplastic disease, right humerus, initial encounter for fracture 11/24/2017   Hyperuricemia 11/22/2017   Pathological fracture due to neoplastic  disease, initial encounter 11/21/2017   COPD GOLD II 07/05/2017   COLONIC POLYPS, HYPERPLASTIC, HX OF 05/28/2010   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

## 2021-09-30 NOTE — ED Notes (Signed)
Pt assisted onto bedpan.

## 2021-09-30 NOTE — ED Provider Notes (Addendum)
[facility] DEPT Provider Note   CSN: [id number] Arrival date & time: 09/30/21  1052     History Chief Complaint  Patient presents with   Wrist Pain    Left wrist pain   Wauna is a 80 y.o. female.  HPI Patient was just recently diagnosed with shingles.  She has a large area on her buttock and thigh.  She got started on antivirals and antibiotics (doxycycline and valacyclovir) 2 days ago.  She reports she does have a lot of pain.  She did get nauseated and vomited once.  Patient reports that she thinks she lost her balance and fell.  She is not exactly sure why she fell.  She reports she injured her right wrist.  She does not have other areas of pain complaint.  She reports that actually happened because her husband was trying to help her get up but then he fell as well and she suspects he fell onto her wrist.  Patient denied other positives on review of systems.  She did admit that her memory was slightly poor.  Patient daughter later came and clarified that the patient has had some increased weakness over at least several days.  She also reports they have noted some memory loss.  Patient's daughter reports that the patient actually fell last night during the night trying to go to the bathroom.  Her husband could not get her up so EMS was called.  EMS went to the home and helped the patient up, to the bathroom and then got her situated again.  They did not transport at that time.  So, the second time she fell, she injured her wrist and was transported to the emergency department.  Patient's daughter has not noted specific changes in behavior, appetite or signs of acute illness.  She and family members have been noting incremental general weakness and decreased recall.    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   Delayed wound healing 10/09/2019   Vitamin B12 deficiency 08/06/2019   Osteoporosis 08/06/2019   Hypomagnesemia    Hypokalemia    Prolonged Q-T interval on ECG    Femur fracture, left ([place]) 04/15/2019   Acute blood loss anemia    Status post total hip replacement, left    Supplemental oxygen dependent    Closed left hip fracture, initial encounter ([place]) 04/10/2019   Leukopenia due to antineoplastic chemotherapy ([place]) 01/25/2019   Bilateral arm pain 12/14/2018   Pancytopenia, acquired ([place]) 06/20/2018   Weight loss 06/20/2018   Physical deconditioning 04/10/2018   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   Chronic respiratory failure with hypoxia ([place]) 12/30/2017   Neutropenic fever ([place]) 12/18/2017   Thrombocytopenia ([place]) 12/18/2017   Abdominal fullness 12/18/2017   Mucositis due to antineoplastic therapy 12/15/2017   Heart burn 12/15/2017   Frequent headaches 12/15/2017   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   Goals of care, counseling/discussion 11/29/2017   Pathological fracture in neoplastic disease, right humerus, initial encounter for fracture 11/24/2017  Hyperuricemia 11/22/2017   Pathological fracture due to neoplastic disease, initial encounter 11/21/2017   COPD GOLD II 07/05/2017   COLONIC POLYPS, HYPERPLASTIC, HX OF 05/28/2010   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   Depression 05/05/2007    Past Surgical History:  Procedure Laterality Date   CYSTECTOMY W/ URETEROILEAL CONDUIT  1997   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   FRACTURE SURGERY     IR FLUORO GUIDE PORT INSERTION RIGHT  12/07/2017   IR REMOVAL TUN ACCESS W/ PORT W/O FL MOD SED  11/04/2020   IR  US GUIDE VASC ACCESS RIGHT  12/07/2017   ORIF HUMERUS FRACTURE Right 11/23/2017   Procedure: OPEN REDUCTION INTERNAL FIXATION (ORIF) HUMERAL SHAFT FRACTURE;  Surgeon: Shona Needles, MD;  Location: [facility];  Service: Orthopedics;  Laterality: Right;   RIGHT OOPHORECTOMY  1976   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   Procedure: TOTAL HIP ARTHROPLASTY ANTERIOR APPROACH LEFT HIP;  Surgeon: Rod Can, MD;  Location: [facility];  Service: Orthopedics;  Laterality: Left;     OB History   No obstetric history on file.     Family History  Problem Relation Age of Onset   Cancer Mother        colon ca    Social History   Tobacco Use   Smoking status: Former    Packs/day: 0.25    Years: 51.00    Pack years: 12.75    Types: Cigarettes    Quit date: 03/12/2015    Years since quitting: 6.5   Smokeless tobacco: Never  Vaping Use   Vaping Use: Some days  Substance Use Topics   Alcohol use: Yes    Alcohol/week: 0.0 standard drinks    Comment: 1 glass of wine monthly   Drug use: No    Home Medications Prior to Admission medications   Medication Sig Start Date End Date Taking? Authorizing Provider  albuterol (PROVENTIL HFA;VENTOLIN HFA) 108 (90 Base) MCG/ACT inhaler Inhale 2 puffs into the lungs every 6 (six) hours as needed for wheezing or shortness of breath. 10/23/18   Martinique, Betty G, MD  aspirin EC 81 MG tablet Take 81 mg by mouth daily.    [provider]  cholecalciferol (VITAMIN D) 1000 units tablet Take 2,000 Units by mouth daily.    [provider]  Fluticasone-Umeclidin-Vilant (TRELEGY ELLIPTA) 100-62.5-25 MCG/ACT AEPB Inhale 1 puff into the lungs daily. 07/30/21   Parrett, Fonnie Mu, NP  Fluticasone-Umeclidin-Vilant (TRELEGY ELLIPTA) 100-62.5-25 MCG/ACT AEPB Inhale 1 puff into the lungs daily. 07/30/21   Parrett, Fonnie Mu, NP  Probiotic Product (PROBIOTIC PO) Take by mouth.    [provider]  sertraline (ZOLOFT) 25 MG tablet TAKE 1 TABLET BY MOUTH DAILY  05/12/21   Nafziger, Tommi Rumps, NP  triamcinolone 0.5%-Eucerin equivalent 1:1 cream mixture Apply topically 2 (two) times daily. 03/13/21   Nafziger, Tommi Rumps, NP    Allergies    Contrast media [iodinated diagnostic agents], Iohexol, Povidone-iodine, Penicillins, and Adhesive [tape]  Review of Systems   Review of Systems 10 systems reviewed and negative except as per HPI Physical Exam Updated Vital Signs BP (!) 160/79    Pulse 83    Temp 98.1 F (36.7 C) (Oral)    Resp 16    Ht 5\' 4"  (1.626 m)    Wt 56.7 kg    LMP  (LMP Unknown)    SpO2 95%    BMI 21.46 kg/m   Physical Exam Constitutional:  Comments: Patient is alert.  She is interactive.  She is answering questions.  No respiratory distress at rest.  HENT:     Head: Normocephalic and atraumatic.     Mouth/Throat:     Mouth: Mucous membranes are moist.     Pharynx: Oropharynx is clear.  Eyes:     Extraocular Movements: Extraocular movements intact.  Neck:     Comments: No midline C-spine tenderness Cardiovascular:     Rate and Rhythm: Normal rate and regular rhythm.  Pulmonary:     Effort: Pulmonary effort is normal.     Breath sounds: Normal breath sounds.  Abdominal:     General: There is no distension.     Palpations: Abdomen is soft.     Tenderness: There is no abdominal tenderness. There is no guarding.  Musculoskeletal:     Comments: Patient has a painful mildly deformed left wrist.  Patient is neurovascularly intact.  Lower extremity deformities or contusions or abrasions.  She does not have any other apparent areas of pain or injury to the extremities.  Skin:    General: Skin is warm and dry.  Neurological:     Comments: Patient is alert.  She answers questions appropriately.  She does sometimes falter with recall for certain events.  She follows commands appropriately.  No focal motor deficits.  Psychiatric:        Mood and Affect: Mood normal.       ED Results / Procedures / Treatments   Labs (all labs ordered are  listed, but only abnormal results are displayed) Labs Reviewed  CBC WITH DIFFERENTIAL/PLATELET - Abnormal; Notable for the following components:      Result Value   MCV 101.7 (*)    All other components within normal limits  URINALYSIS, ROUTINE W REFLEX MICROSCOPIC - Abnormal; Notable for the following components:   APPearance HAZY (*)    Hgb urine dipstick SMALL (*)    Protein, ur 30 (*)    All other components within normal limits  RESP PANEL BY RT-PCR (FLU A&B, COVID) ARPGX2  URINE CULTURE  BRAIN NATRIURETIC PEPTIDE  COMPREHENSIVE METABOLIC PANEL  TROPONIN I (HIGH SENSITIVITY)  TROPONIN I (HIGH SENSITIVITY)    EKG None  Radiology DG Wrist Complete Left  Result Date: 09/30/2021 CLINICAL DATA:  Fall EXAM: LEFT WRIST - COMPLETE 3+ VIEW COMPARISON:  None. FINDINGS: Mildly displaced impaction fracture of the distal radius. There is mild volar angulation of the distal fracture fragment. There is approximately 3 mm anterior displacement of the distal fracture fragment. Moderate osteoarthritis of the first carpometacarpal joint. Diffuse osteopenia. IMPRESSION: Mildly displaced impaction fracture of the distal radius. Electronically Signed   By: Keane Police D.O.   On: 09/30/2021 13:05   CT Head Wo Contrast  Result Date: 09/30/2021 CLINICAL DATA:  Altered mental status EXAM: CT HEAD WITHOUT CONTRAST TECHNIQUE: Contiguous axial images were obtained from the base of the skull through the vertex without intravenous contrast. COMPARISON:  None. FINDINGS: Brain: No acute intracranial hemorrhage, mass effect, or herniation. No extra-axial fluid collections. No evidence of acute territorial infarct. No hydrocephalus. Mild-to-moderate cortical volume loss. Extensive hypodensities throughout the periventricular and subcortical white matter, likely secondary to advanced chronic microvascular ischemic changes. Vascular: No hyperdense vessel or unexpected calcification. Skull: Normal. Negative for  fracture or focal lesion. Sinuses/Orbits: No acute finding. Other: None. IMPRESSION: Chronic changes with no acute intracranial process identified. Electronically Signed   By: Ofilia Neas M.D.   On: 09/30/2021 14:25  DG Chest Port 1 View  Result Date: 09/30/2021 CLINICAL DATA:  Fall, contusion EXAM: PORTABLE CHEST 1 VIEW COMPARISON:  Chest x-ray 07/30/2021 FINDINGS: Heart size is upper normal. Mediastinum appears stable. Calcified plaques in the aortic arch. Lungs are hyperinflated with chronic interstitial lung markings/scarring in the lower lung zones. No focal consolidation identified. No significant pleural effusion or pneumothorax visualized. No acute osseous abnormality identified. IMPRESSION: Chronic emphysematous changes with no acute process identified. Electronically Signed   By: Ofilia Neas M.D.   On: 09/30/2021 14:50    Procedures Procedures   Medications Ordered in ED Medications  acetaminophen (TYLENOL) tablet 1,000 mg (has no administration in time range)    ED Course  I have reviewed the triage vital signs and the nursing notes.  Pertinent labs & imaging results that were available during my care of the patient were reviewed by me and considered in my medical decision making (see chart for details).    MDM Rules/Calculators/A&P                         Patient presents with a fall.  This is a second fall in less than 24 hours.  Falls are likely secondary to significant generalized weakness with recent and fairly extensive shingles and starting medications.  Second fall has resulted in a distal radius fracture with slight displacement and impaction.  Splint applied.  Patient is neurovascularly intact.  Due to 2 falls within 24 hours and daughter's concern for weakness and instability, will proceed with diagnostic evaluation including CT head, chest x-ray and labs.  Patient does not have any focal motor deficits.  No acute suggestion of CVA.  Review of systems does  not suggest infectious etiology at this time.  Will review results for final disposition and treatment plan.  Due to significant general weakness resulting in 2 falls within 24 hours, anticipate admission.   Final Clinical Impression(s) / ED Diagnoses Final diagnoses:  Fall, initial encounter  Herpes zoster with other complication  General weakness  Closed fracture of distal end of left radius, unspecified fracture morphology, initial encounter    Rx / DC Orders ED Discharge Orders     None        Charlesetta Shanks, MD 09/30/21 1546    Charlesetta Shanks, MD 09/30/21 1548

## 2021-09-30 NOTE — ED Notes (Signed)
Pt returned from xray

## 2021-10-01 DIAGNOSIS — S52502A Unspecified fracture of the lower end of left radius, initial encounter for closed fracture: Secondary | ICD-10-CM | POA: Diagnosis not present

## 2021-10-01 MED ORDER — ACETAMINOPHEN 325 MG PO TABS
650.0000 mg | ORAL_TABLET | Freq: Four times a day (QID) | ORAL | Status: DC | PRN
  Administered 2021-10-01 – 2021-10-03 (×4): 650 mg via ORAL
  Filled 2021-10-01 (×6): qty 2

## 2021-10-01 MED ORDER — HYDROCODONE-ACETAMINOPHEN 5-325 MG PO TABS
1.0000 | ORAL_TABLET | Freq: Four times a day (QID) | ORAL | Status: DC | PRN
  Administered 2021-10-01 – 2021-10-03 (×7): 1 via ORAL
  Filled 2021-10-01 (×7): qty 1

## 2021-10-01 NOTE — Progress Notes (Signed)
TOC CSW currently awaiting PT evaluation.  Brooke Washington, MSW, LCSW-A Pronouns:  She/Her/Hers [place] Transitions of Care Clinical Social Worker Direct Number:  [phone] [email or web address]

## 2021-10-01 NOTE — ED Notes (Signed)
Transferred pt to hospital bed. Provided peri care. Set up purwick and suction. Pt on 1 liter of oxygen while at rest.

## 2021-10-02 DIAGNOSIS — S52502A Unspecified fracture of the lower end of left radius, initial encounter for closed fracture: Secondary | ICD-10-CM | POA: Diagnosis not present

## 2021-10-02 MED ORDER — DOCUSATE SODIUM 100 MG PO CAPS
100.0000 mg | ORAL_CAPSULE | Freq: Two times a day (BID) | ORAL | Status: DC
  Administered 2021-10-02 (×2): 100 mg via ORAL
  Filled 2021-10-02 (×2): qty 1

## 2021-10-02 MED ORDER — POLYETHYLENE GLYCOL 3350 17 G PO PACK
17.0000 g | PACK | Freq: Every day | ORAL | Status: DC | PRN
  Filled 2021-10-02: qty 1

## 2021-10-02 MED ORDER — VALACYCLOVIR HCL 500 MG PO TABS
1000.0000 mg | ORAL_TABLET | Freq: Three times a day (TID) | ORAL | Status: DC
  Administered 2021-10-02: 22:00:00 1000 mg via ORAL
  Filled 2021-10-02: qty 2

## 2021-10-02 MED ORDER — MUPIROCIN CALCIUM 2 % EX CREA
TOPICAL_CREAM | Freq: Two times a day (BID) | CUTANEOUS | Status: DC
  Filled 2021-10-02: qty 15

## 2021-10-02 NOTE — Progress Notes (Signed)
CSW spoke with pt's and pt's husband I regards to PT recommendations. They both agreed for pt to be worked \\up  for SNF. CSW to fax pt out for SNF.   Arlie Solomons.Jesscia Imm, MSW, [place]   Transitions of Care Clinical Social Worker I Direct Dial: [phone]   Fax: [phone] [email or web address]

## 2021-10-02 NOTE — Progress Notes (Signed)
Inpatient Rehabilitation Admissions Coordinator   Asked by [facility] to review for possible CIR admit per family request. Past admit she was at [facility] for femur fracture. Noted now with falls and has left distal radius fracture. She lacks the medical neccesity for an inpatient hospital rehab admit for this diagnosis . Other rehab venues options recommended to be pursued. We will not pursue CIR admit.  Danne Baxter, RN, MSN Rehab Admissions Coordinator [phone] 10/02/2021 10:28 AM

## 2021-10-02 NOTE — Evaluation (Signed)
Physical Therapy Evaluation Patient Details Name: Brooke Washington MRN: [id number] DOB: Feb 06, 1941 Today's Date: 10/02/2021  History of Present Illness  80 yo female brought in by EMS 12.21.22. Patient has a fall previously but not transported to Ed ,  had increased Left wrist pain and difficulty ambulating so now brought to ED. Patient found to have  left distal radius fracture, casted in ED. patient diagnosed with shingles recently, on medication.  PMH: L hip fracture, CPOD, peripheral neuropathy, CA, pathological fracture /ORIF R humerus.  Clinical Impression  The patient  reports feeling very tired. Patient required mod/max assistance to sit up onto bed edge( difficulty without  LUE use).  Patient stood x 2 with mod/max support of  2 persons and able to step to recliner with  LUE support and RUE supported on RW, maintaining NWB left wrist.  Patient tolerated well with reported Left arm pain when touched. And moved. Encouraged active  left finger ROM.  Pt admitted with above diagnosis.  Pt currently with functional limitations due to the deficits listed below (see PT Problem List). Pt will benefit from skilled PT to increase their independence and safety with mobility to allow discharge to the venue listed below. Patient will benefit from post acute SNF rehab as spouse unable to provide physical support.           Recommendations for follow up therapy are one component of a multi-disciplinary discharge planning process, led by the attending physician.  Recommendations may be updated based on patient status, additional functional criteria and insurance authorization.  Follow Up Recommendations Skilled nursing-short term rehab (<3 hours/day)    Assistance Recommended at Discharge    Functional Status Assessment Patient has had a recent decline in their functional status and demonstrates the ability to make significant improvements in function in a reasonable and predictable amount of time.   Equipment Recommendations  None recommended by PT    Recommendations for Other Services       Precautions / Restrictions Precautions Precautions: Fall Precaution Comments: has shingles but airborne not indicated per MD Required Braces or Orthoses: Splint/Cast Splint/Cast: left forearm/wrist Restrictions Weight Bearing Restrictions: Yes LUE Weight Bearing: Non weight bearing Other Position/Activity Restrictions: left wrist.      Mobility  Bed Mobility Overal bed mobility: Needs Assistance Bed Mobility: Supine to Sit     Supine to sit: HOB elevated;Mod assist     General bed mobility comments: difficulty without use of bilateral arms, assisted  using  bed pad    Transfers Overall transfer level: Needs assistance Equipment used: Rolling walker (2 wheels) Transfers: Sit to/from Stand;Bed to chair/wheelchair/BSC Sit to Stand: Mod assist;+2 physical assistance;+2 safety/equipment   Step pivot transfers: +2 safety/equipment;+2 physical assistance;Mod assist       General transfer comment: stood  x2 with max support, RUE on RW,  and Left upper arm support on second standup,able to  take small steps to pivot to recliner, unsteady standing, reports neuropath    Ambulation/Gait                  Stairs            Wheelchair Mobility    Modified Rankin (Stroke Patients Only)       Balance Overall balance assessment: History of Falls;Needs assistance Sitting-balance support: Single extremity supported;Feet supported Sitting balance-Leahy Scale: Fair Sitting balance - Comments: initially reported  fear of falling, improved with time. Postural control: Posterior lean Standing balance support: Bilateral upper extremity supported;During functional  activity;Reliant on assistive device for balance Standing balance-Leahy Scale: Poor Standing balance comment: mod support for balance                             Pertinent Vitals/Pain Pain  Assessment: Faces Faces Pain Scale: Hurts whole lot Pain Location: left  forearm/wrist Pain Intervention(s): Limited activity within patient's tolerance;Monitored during session;Repositioned    Home Living Family/patient expects to be discharged to:: Private residence Living Arrangements: Spouse/significant other Available Help at Discharge: Available 24 hours/day Type of Home: House Home Access: Level entry       Home Layout: Two level;Able to live on main level with bedroom/bathroom Home Equipment: Rolling Walker (2 wheels);Cane - single point;Toilet riser      Prior Function Prior Level of Function : Independent/Modified Independent             Mobility Comments: usually does not use Ad, spouse limited in physical assistance       Hand Dominance   Dominant Hand: Right    Extremity/Trunk Assessment   Upper Extremity Assessment Upper Extremity Assessment: LUE deficits/detail LUE Deficits / Details: casted forearm, encouraged finger ROM    Lower Extremity Assessment Lower Extremity Assessment: RLE deficits/detail;LLE deficits/detail RLE Deficits / Details: significant poster thigh shingles lesions, RLE Sensation: history of peripheral neuropathy LLE Sensation: history of peripheral neuropathy       Communication   Communication: No difficulties  Cognition Arousal/Alertness: Awake/alert Behavior During Therapy: Anxious;WFL for tasks assessed/performed Overall Cognitive Status: Within Functional Limits for tasks assessed Area of Impairment: Orientation                 Orientation Level: Time             General Comments: generally WFL, date is off        General Comments      Exercises     Assessment/Plan    PT Assessment Patient needs continued PT services  PT Problem List Decreased strength;Decreased mobility;Decreased safety awareness;Decreased knowledge of precautions;Decreased range of motion;Decreased activity tolerance;Decreased  skin integrity;Decreased balance;Pain;Decreased knowledge of use of DME;Impaired sensation       PT Treatment Interventions DME instruction;Therapeutic activities;Gait training;Therapeutic exercise;Patient/family education;Balance training;Functional mobility training    PT Goals (Current goals can be found in the Care Plan section)  Acute Rehab PT Goals Patient Stated Goal: to walk PT Goal Formulation: With patient Time For Goal Achievement: 10/16/21 Potential to Achieve Goals: Fair    Frequency Min 2X/week   Barriers to discharge        Co-evaluation               AM-PAC PT "6 Clicks" Mobility  Outcome Measure Help needed turning from your back to your side while in a flat bed without using bedrails?: A Lot Help needed moving from lying on your back to sitting on the side of a flat bed without using bedrails?: A Lot Help needed moving to and from a bed to a chair (including a wheelchair)?: Total Help needed standing up from a chair using your arms (e.g., wheelchair or bedside chair)?: Total Help needed to walk in hospital room?: Total Help needed climbing 3-5 steps with a railing? : Total 6 Click Score: 8    End of Session   Activity Tolerance: Patient limited by fatigue Patient left: in chair;with call bell/phone within reach;with nursing/sitter in room Nurse Communication: Mobility status PT Visit Diagnosis: Unsteadiness on feet (R26.81);Pain;Repeated falls (R29.6);History  of falling (Z91.81) Pain - Right/Left: Left Pain - part of body: Hand    Time: 3887-1959 PT Time Calculation (min) (ACUTE ONLY): 29 min   Charges:   PT Evaluation $PT Eval Low Complexity: 1 Low PT Treatments $Therapeutic Activity: 8-22 mins        Tresa Endo PT Acute Rehabilitation Services Pager [phone] Office [phone]   Claretha Cooper 10/02/2021, 9:45 AM

## 2021-10-02 NOTE — [place] FL2 (Signed)
[place] MEDICAID FL2 LEVEL OF CARE SCREENING TOOL     IDENTIFICATION  Patient Name: Brooke Washington Birthdate: Jul 29, 1941 Sex: female Admission Date (Current Location): 09/30/2021  [facility] and [place] Number:  Herbalist and Address:  [facility],  [address], [place]      Provider Number: [id number]  Attending Physician Name and Address:  Default, Provider, MD  Relative Name and Phone Number:  Milliani, Herrada (Spouse)   [phone]    Current Level of Care: Hospital Recommended Level of Care: [place] Prior Approval Number:    Date Approved/Denied:   PASRR Number: [id number]  Discharge Plan: SNF    Current Diagnoses: Patient Active Problem List   Diagnosis Date Noted   Skin rash 04/20/2021   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   Delayed wound healing 10/09/2019   Vitamin B12 deficiency 08/06/2019   Osteoporosis 08/06/2019   Hypomagnesemia    Hypokalemia    Prolonged Q-T interval on ECG    Femur fracture, left (HCC) 04/15/2019   Acute blood loss anemia    Status post total hip replacement, left    Supplemental oxygen dependent    Closed left hip fracture, initial encounter ([place]) 04/10/2019   Leukopenia due to antineoplastic chemotherapy ([place]) 01/25/2019   Bilateral arm pain 12/14/2018   Pancytopenia, acquired ([place]) 06/20/2018   Weight loss 06/20/2018   Physical deconditioning 04/10/2018   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   Chronic respiratory failure with hypoxia (HCC) 12/30/2017   Neutropenic fever ([place]) 12/18/2017   Thrombocytopenia (HCC) 12/18/2017   Abdominal fullness 12/18/2017   Mucositis due to antineoplastic therapy 12/15/2017   Heart burn 12/15/2017   Frequent headaches 12/15/2017   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   Goals of care, counseling/discussion 11/29/2017   Pathological fracture in neoplastic disease, right humerus, initial encounter for fracture  11/24/2017   Hyperuricemia 11/22/2017   Pathological fracture due to neoplastic disease, initial encounter 11/21/2017   COPD GOLD II 07/05/2017   COLONIC POLYPS, HYPERPLASTIC, HX OF 05/28/2010   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[place]) 11/27/2007   Depression 05/05/2007    Orientation RESPIRATION BLADDER Height & Weight     Self, Time, Situation, Place  O2 (1 Liter) Continent Weight: 125 lb (56.7 kg) Height:  5\' 4"  (162.6 cm)  BEHAVIORAL SYMPTOMS/MOOD NEUROLOGICAL BOWEL NUTRITION STATUS      Continent Diet (regular)  AMBULATORY STATUS COMMUNICATION OF NEEDS Skin   Limited Assist Verbally Bruising, Normal                       Personal Care Assistance Level of Assistance  Bathing, Feeding, Dressing Bathing Assistance: Limited assistance Feeding assistance: Independent Dressing Assistance: Limited assistance     Functional Limitations Info  Sight, Hearing, Speech Sight Info: Adequate Hearing Info: Adequate Speech Info: Adequate    SPECIAL CARE FACTORS FREQUENCY  PT (By licensed PT), OT (By licensed OT)     PT Frequency: 5x a week OT Frequency: 5x a week            Contractures Contractures Info: Not present    Additional Factors Info  Code Status, Allergies Code Status Info: full Allergies Info: Contrast Media iodinated Diagnostic Agents, Iohexol,Povidone-iodine,Penicillins           Current Medications (10/02/2021):  This is the current hospital active medication list Current Facility-Administered Medications  Medication Dose Route Frequency Provider Last Rate Last Admin   acetaminophen (TYLENOL) tablet 650  mg  650 mg Oral Q6H PRN Truddie Hidden, MD   650 mg at 10/02/21 0846   albuterol (VENTOLIN HFA) 108 (90 Base) MCG/ACT inhaler 2 puff  2 puff Inhalation Q6H PRN Lacretia Leigh, MD       aspirin EC tablet 81 mg  81 mg Oral Daily Lacretia Leigh, MD   81 mg at 10/02/21 0846   fluticasone furoate-vilanterol (BREO ELLIPTA) 100-25 MCG/ACT 1 puff  1  puff Inhalation Daily Lacretia Leigh, MD   1 puff at 10/02/21 0847   And   umeclidinium bromide (INCRUSE ELLIPTA) 62.5 MCG/ACT 1 puff  1 puff Inhalation Daily Lacretia Leigh, MD   1 puff at 10/02/21 0847   HYDROcodone-acetaminophen (NORCO/VICODIN) 5-325 MG per tablet 1 tablet  1 tablet Oral Q6H PRN Truddie Hidden, MD   1 tablet at 10/02/21 1157   sertraline (ZOLOFT) tablet 25 mg  25 mg Oral Daily Lacretia Leigh, MD   25 mg at 10/02/21 2620   Current Outpatient Medications  Medication Sig Dispense Refill   albuterol (PROVENTIL HFA;VENTOLIN HFA) 108 (90 Base) MCG/ACT inhaler Inhale 2 puffs into the lungs every 6 (six) hours as needed for wheezing or shortness of breath. 1 Inhaler 0   aspirin EC 81 MG tablet Take 81 mg by mouth daily.     cholecalciferol (VITAMIN D) 1000 units tablet Take 2,000 Units by mouth daily.     Fluticasone-Umeclidin-Vilant (TRELEGY ELLIPTA) 100-62.5-25 MCG/ACT AEPB Inhale 1 puff into the lungs daily. 1 each 0   sertraline (ZOLOFT) 25 MG tablet TAKE 1 TABLET BY MOUTH DAILY (Patient taking differently: Take 25 mg by mouth daily.) 90 tablet 1   triamcinolone 0.5%-Eucerin equivalent 1:1 cream mixture Apply topically 2 (two) times daily. 454 g 2   valACYclovir (VALTREX) 1000 MG tablet Take 1,000 mg by mouth 3 (three) times daily. for shingles       Discharge Medications: Please see discharge summary for a list of discharge medications.  Relevant Imaging Results:  Relevant Lab Results:   Additional Information SSN, [id number],  COVID x3  Arlie Solomons Siedah Sedor, LCSW

## 2021-10-02 NOTE — Discharge Instructions (Addendum)
[facility] to [facility] now

## 2021-10-02 NOTE — ED Provider Notes (Signed)
Patient stable without any complaints.  Patient awaiting nursing home placement for fatigue and shingles   Milton Ferguson, MD 10/02/21 [phone]

## 2021-10-02 NOTE — Progress Notes (Signed)
Pt was offered choice , pt chose [facility] , CSW contact Threasea from [facility] to confirm room number RM 103 call reported [phone]. CSW to emailed AVS.   PTAR called   Brooke Washington, MSW, [place]   Transitions of Care Clinical Social Worker I Direct Dial: [phone]   Fax: [phone] [email or web address]

## 2021-10-02 NOTE — Progress Notes (Signed)
Pt's information has been faxed out bed offers Pending.  Brooke Washington.Monte Bronder, MSW, [place]   Transitions of Care Clinical Social Worker I Direct Dial: [phone]   Fax: [phone] [email or web address]

## 2021-10-03 DIAGNOSIS — B0223 Postherpetic polyneuropathy: Secondary | ICD-10-CM | POA: Diagnosis not present

## 2021-10-03 DIAGNOSIS — Z471 Aftercare following joint replacement surgery: Secondary | ICD-10-CM | POA: Diagnosis not present

## 2021-10-03 DIAGNOSIS — Z96642 Presence of left artificial hip joint: Secondary | ICD-10-CM | POA: Diagnosis not present

## 2021-10-03 DIAGNOSIS — R5381 Other malaise: Secondary | ICD-10-CM | POA: Diagnosis not present

## 2021-10-03 DIAGNOSIS — W19XXXA Unspecified fall, initial encounter: Secondary | ICD-10-CM | POA: Diagnosis not present

## 2021-10-03 DIAGNOSIS — S52502A Unspecified fracture of the lower end of left radius, initial encounter for closed fracture: Secondary | ICD-10-CM | POA: Diagnosis not present

## 2021-10-03 DIAGNOSIS — R531 Weakness: Secondary | ICD-10-CM | POA: Diagnosis not present

## 2021-10-03 DIAGNOSIS — R2689 Other abnormalities of gait and mobility: Secondary | ICD-10-CM | POA: Diagnosis not present

## 2021-10-03 DIAGNOSIS — R059 Cough, unspecified: Secondary | ICD-10-CM | POA: Diagnosis not present

## 2021-10-03 DIAGNOSIS — J449 Chronic obstructive pulmonary disease, unspecified: Secondary | ICD-10-CM | POA: Diagnosis not present

## 2021-10-03 DIAGNOSIS — M25532 Pain in left wrist: Secondary | ICD-10-CM | POA: Diagnosis not present

## 2021-10-03 DIAGNOSIS — R262 Difficulty in walking, not elsewhere classified: Secondary | ICD-10-CM | POA: Diagnosis not present

## 2021-10-03 DIAGNOSIS — Z4732 Aftercare following explantation of hip joint prosthesis: Secondary | ICD-10-CM | POA: Diagnosis not present

## 2021-10-03 DIAGNOSIS — Z743 Need for continuous supervision: Secondary | ICD-10-CM | POA: Diagnosis not present

## 2021-10-03 DIAGNOSIS — F32A Depression, unspecified: Secondary | ICD-10-CM | POA: Diagnosis not present

## 2021-10-03 DIAGNOSIS — Z9181 History of falling: Secondary | ICD-10-CM | POA: Diagnosis not present

## 2021-10-03 DIAGNOSIS — R918 Other nonspecific abnormal finding of lung field: Secondary | ICD-10-CM | POA: Diagnosis not present

## 2021-10-03 DIAGNOSIS — S72002A Fracture of unspecified part of neck of left femur, initial encounter for closed fracture: Secondary | ICD-10-CM | POA: Diagnosis not present

## 2021-10-03 DIAGNOSIS — Z96652 Presence of left artificial knee joint: Secondary | ICD-10-CM | POA: Diagnosis not present

## 2021-10-03 DIAGNOSIS — Z859 Personal history of malignant neoplasm, unspecified: Secondary | ICD-10-CM | POA: Diagnosis not present

## 2021-10-03 DIAGNOSIS — M6281 Muscle weakness (generalized): Secondary | ICD-10-CM | POA: Diagnosis not present

## 2021-10-03 DIAGNOSIS — Z20822 Contact with and (suspected) exposure to covid-19: Secondary | ICD-10-CM | POA: Diagnosis not present

## 2021-10-03 DIAGNOSIS — B028 Zoster with other complications: Secondary | ICD-10-CM | POA: Diagnosis not present

## 2021-10-03 DIAGNOSIS — S52502D Unspecified fracture of the lower end of left radius, subsequent encounter for closed fracture with routine healing: Secondary | ICD-10-CM | POA: Diagnosis not present

## 2021-10-03 DIAGNOSIS — R279 Unspecified lack of coordination: Secondary | ICD-10-CM | POA: Diagnosis not present

## 2021-10-03 DIAGNOSIS — B029 Zoster without complications: Secondary | ICD-10-CM | POA: Diagnosis not present

## 2021-10-03 NOTE — ED Notes (Signed)
PTAR at bedside for transport.  

## 2021-10-06 DIAGNOSIS — B029 Zoster without complications: Secondary | ICD-10-CM | POA: Diagnosis not present

## 2021-10-06 DIAGNOSIS — S52502A Unspecified fracture of the lower end of left radius, initial encounter for closed fracture: Secondary | ICD-10-CM | POA: Diagnosis not present

## 2021-10-06 DIAGNOSIS — R5381 Other malaise: Secondary | ICD-10-CM | POA: Diagnosis not present

## 2021-10-06 DIAGNOSIS — W19XXXA Unspecified fall, initial encounter: Secondary | ICD-10-CM | POA: Diagnosis not present

## 2021-10-06 DIAGNOSIS — R531 Weakness: Secondary | ICD-10-CM | POA: Diagnosis not present

## 2021-10-06 DIAGNOSIS — B0223 Postherpetic polyneuropathy: Secondary | ICD-10-CM | POA: Diagnosis not present

## 2021-10-06 DIAGNOSIS — F32A Depression, unspecified: Secondary | ICD-10-CM | POA: Diagnosis not present

## 2021-10-06 DIAGNOSIS — J449 Chronic obstructive pulmonary disease, unspecified: Secondary | ICD-10-CM | POA: Diagnosis not present

## 2021-10-19 DIAGNOSIS — Z20822 Contact with and (suspected) exposure to covid-19: Secondary | ICD-10-CM | POA: Diagnosis not present

## 2021-10-19 DIAGNOSIS — B0223 Postherpetic polyneuropathy: Secondary | ICD-10-CM | POA: Diagnosis not present

## 2021-10-19 DIAGNOSIS — R059 Cough, unspecified: Secondary | ICD-10-CM | POA: Diagnosis not present

## 2021-10-19 DIAGNOSIS — B029 Zoster without complications: Secondary | ICD-10-CM | POA: Diagnosis not present

## 2021-10-20 DIAGNOSIS — M25532 Pain in left wrist: Secondary | ICD-10-CM | POA: Diagnosis not present

## 2021-10-21 ENCOUNTER — Inpatient Hospital Stay: Payer: Medicare Other | Admitting: Hematology and Oncology

## 2021-10-21 ENCOUNTER — Inpatient Hospital Stay: Payer: Medicare Other

## 2021-10-21 DIAGNOSIS — R059 Cough, unspecified: Secondary | ICD-10-CM | POA: Diagnosis not present

## 2021-10-21 DIAGNOSIS — R531 Weakness: Secondary | ICD-10-CM | POA: Diagnosis not present

## 2021-10-21 DIAGNOSIS — S52502A Unspecified fracture of the lower end of left radius, initial encounter for closed fracture: Secondary | ICD-10-CM | POA: Diagnosis not present

## 2021-10-21 DIAGNOSIS — F32A Depression, unspecified: Secondary | ICD-10-CM | POA: Diagnosis not present

## 2021-10-21 DIAGNOSIS — J449 Chronic obstructive pulmonary disease, unspecified: Secondary | ICD-10-CM | POA: Diagnosis not present

## 2021-10-21 DIAGNOSIS — B0223 Postherpetic polyneuropathy: Secondary | ICD-10-CM | POA: Diagnosis not present

## 2021-10-21 DIAGNOSIS — R5381 Other malaise: Secondary | ICD-10-CM | POA: Diagnosis not present

## 2021-10-21 DIAGNOSIS — B029 Zoster without complications: Secondary | ICD-10-CM | POA: Diagnosis not present

## 2021-10-22 ENCOUNTER — Inpatient Hospital Stay: Payer: Medicare Other | Admitting: Hematology and Oncology

## 2021-10-22 ENCOUNTER — Inpatient Hospital Stay: Payer: Medicare Other

## 2021-10-22 DIAGNOSIS — B029 Zoster without complications: Secondary | ICD-10-CM | POA: Diagnosis not present

## 2021-10-22 DIAGNOSIS — W19XXXA Unspecified fall, initial encounter: Secondary | ICD-10-CM | POA: Diagnosis not present

## 2021-10-22 DIAGNOSIS — S52502A Unspecified fracture of the lower end of left radius, initial encounter for closed fracture: Secondary | ICD-10-CM | POA: Diagnosis not present

## 2021-10-22 DIAGNOSIS — B0223 Postherpetic polyneuropathy: Secondary | ICD-10-CM | POA: Diagnosis not present

## 2021-10-22 DIAGNOSIS — R5381 Other malaise: Secondary | ICD-10-CM | POA: Diagnosis not present

## 2021-10-22 DIAGNOSIS — J449 Chronic obstructive pulmonary disease, unspecified: Secondary | ICD-10-CM | POA: Diagnosis not present

## 2021-10-23 ENCOUNTER — Telehealth: Payer: Self-pay

## 2021-10-23 DIAGNOSIS — Z7951 Long term (current) use of inhaled steroids: Secondary | ICD-10-CM | POA: Diagnosis not present

## 2021-10-23 DIAGNOSIS — F32A Depression, unspecified: Secondary | ICD-10-CM | POA: Diagnosis not present

## 2021-10-23 DIAGNOSIS — Z9981 Dependence on supplemental oxygen: Secondary | ICD-10-CM | POA: Diagnosis not present

## 2021-10-23 DIAGNOSIS — M818 Other osteoporosis without current pathological fracture: Secondary | ICD-10-CM

## 2021-10-23 DIAGNOSIS — J449 Chronic obstructive pulmonary disease, unspecified: Secondary | ICD-10-CM | POA: Diagnosis not present

## 2021-10-23 DIAGNOSIS — C8590 Non-Hodgkin lymphoma, unspecified, unspecified site: Secondary | ICD-10-CM | POA: Diagnosis not present

## 2021-10-23 DIAGNOSIS — Z9181 History of falling: Secondary | ICD-10-CM | POA: Diagnosis not present

## 2021-10-23 DIAGNOSIS — B0223 Postherpetic polyneuropathy: Secondary | ICD-10-CM | POA: Diagnosis not present

## 2021-10-23 DIAGNOSIS — Z8619 Personal history of other infectious and parasitic diseases: Secondary | ICD-10-CM

## 2021-10-23 DIAGNOSIS — Z7982 Long term (current) use of aspirin: Secondary | ICD-10-CM | POA: Diagnosis not present

## 2021-10-23 DIAGNOSIS — Z87891 Personal history of nicotine dependence: Secondary | ICD-10-CM | POA: Diagnosis not present

## 2021-10-23 DIAGNOSIS — Z8781 Personal history of (healed) traumatic fracture: Secondary | ICD-10-CM | POA: Diagnosis not present

## 2021-10-23 NOTE — Telephone Encounter (Signed)
Spoke to [employer]  and gave verbal orders for PT 3wk2 2w2 and 1wk 4 per [facility]. Pt is wanting a transport chair due to having hx of Shingles caused nerve damage. Pt stated that since she has been in a long term care facility she has developed muscle issues.

## 2021-10-23 NOTE — Telephone Encounter (Signed)
Noted  

## 2021-10-27 ENCOUNTER — Telehealth: Payer: Self-pay | Admitting: Adult Health

## 2021-10-27 DIAGNOSIS — F32A Depression, unspecified: Secondary | ICD-10-CM | POA: Diagnosis not present

## 2021-10-27 DIAGNOSIS — Z9981 Dependence on supplemental oxygen: Secondary | ICD-10-CM | POA: Diagnosis not present

## 2021-10-27 DIAGNOSIS — Z9181 History of falling: Secondary | ICD-10-CM | POA: Diagnosis not present

## 2021-10-27 DIAGNOSIS — J449 Chronic obstructive pulmonary disease, unspecified: Secondary | ICD-10-CM | POA: Diagnosis not present

## 2021-10-27 DIAGNOSIS — B0223 Postherpetic polyneuropathy: Secondary | ICD-10-CM | POA: Diagnosis not present

## 2021-10-27 DIAGNOSIS — C8590 Non-Hodgkin lymphoma, unspecified, unspecified site: Secondary | ICD-10-CM | POA: Diagnosis not present

## 2021-10-27 MED ORDER — TRIAMCINOLONE ACETONIDE 0.5 % EX OINT: 1.0000 "application " | TOPICAL_OINTMENT | Freq: Two times a day (BID) | CUTANEOUS | 2 refills | Status: DC

## 2021-10-27 MED ORDER — LIDOCAINE 5 % EX OINT: 1.0000 "application " | TOPICAL_OINTMENT | Freq: Four times a day (QID) | CUTANEOUS | 1 refills | Status: DC | PRN

## 2021-10-27 NOTE — Telephone Encounter (Signed)
Please advise 

## 2021-10-27 NOTE — Telephone Encounter (Signed)
Patient notified of update  and verbalized understanding. 

## 2021-10-27 NOTE — Telephone Encounter (Signed)
Order has been printed to be faxed. Will call pt to see which company she is wanting this faxed to.

## 2021-10-27 NOTE — Telephone Encounter (Signed)
Sharyn Lull with [facility] called to see if Tommi Rumps would call patient in Lidocaine and Triamcinolone cream for shingles. Sharyn Lull states she is in excruciating pain.      Good callback number is  [phone]        Please advise

## 2021-10-27 NOTE — Telephone Encounter (Signed)
Spoke to pt and spouse and they advised that the order go to [facility] medical supply. Awaiting fax confirmation.

## 2021-10-28 DIAGNOSIS — J449 Chronic obstructive pulmonary disease, unspecified: Secondary | ICD-10-CM | POA: Diagnosis not present

## 2021-10-28 DIAGNOSIS — Z9181 History of falling: Secondary | ICD-10-CM | POA: Diagnosis not present

## 2021-10-28 DIAGNOSIS — C8590 Non-Hodgkin lymphoma, unspecified, unspecified site: Secondary | ICD-10-CM | POA: Diagnosis not present

## 2021-10-28 DIAGNOSIS — F32A Depression, unspecified: Secondary | ICD-10-CM | POA: Diagnosis not present

## 2021-10-28 DIAGNOSIS — B0223 Postherpetic polyneuropathy: Secondary | ICD-10-CM | POA: Diagnosis not present

## 2021-10-28 DIAGNOSIS — Z9981 Dependence on supplemental oxygen: Secondary | ICD-10-CM | POA: Diagnosis not present

## 2021-10-28 NOTE — Telephone Encounter (Signed)
Rx faxed to [facility] with confirmation fax yesterday.

## 2021-10-29 ENCOUNTER — Ambulatory Visit: Payer: Medicare Other | Admitting: Adult Health

## 2021-10-29 DIAGNOSIS — J449 Chronic obstructive pulmonary disease, unspecified: Secondary | ICD-10-CM | POA: Diagnosis not present

## 2021-10-29 DIAGNOSIS — F32A Depression, unspecified: Secondary | ICD-10-CM | POA: Diagnosis not present

## 2021-10-29 DIAGNOSIS — Z9981 Dependence on supplemental oxygen: Secondary | ICD-10-CM | POA: Diagnosis not present

## 2021-10-29 DIAGNOSIS — C8590 Non-Hodgkin lymphoma, unspecified, unspecified site: Secondary | ICD-10-CM | POA: Diagnosis not present

## 2021-10-29 DIAGNOSIS — B0223 Postherpetic polyneuropathy: Secondary | ICD-10-CM | POA: Diagnosis not present

## 2021-10-29 DIAGNOSIS — Z9181 History of falling: Secondary | ICD-10-CM | POA: Diagnosis not present

## 2021-10-30 DIAGNOSIS — J449 Chronic obstructive pulmonary disease, unspecified: Secondary | ICD-10-CM | POA: Diagnosis not present

## 2021-10-30 DIAGNOSIS — F32A Depression, unspecified: Secondary | ICD-10-CM | POA: Diagnosis not present

## 2021-10-30 DIAGNOSIS — Z9981 Dependence on supplemental oxygen: Secondary | ICD-10-CM | POA: Diagnosis not present

## 2021-10-30 DIAGNOSIS — B0223 Postherpetic polyneuropathy: Secondary | ICD-10-CM | POA: Diagnosis not present

## 2021-10-30 DIAGNOSIS — Z9181 History of falling: Secondary | ICD-10-CM | POA: Diagnosis not present

## 2021-10-30 DIAGNOSIS — C8590 Non-Hodgkin lymphoma, unspecified, unspecified site: Secondary | ICD-10-CM | POA: Diagnosis not present

## 2021-11-02 DIAGNOSIS — Z9981 Dependence on supplemental oxygen: Secondary | ICD-10-CM | POA: Diagnosis not present

## 2021-11-02 DIAGNOSIS — C8590 Non-Hodgkin lymphoma, unspecified, unspecified site: Secondary | ICD-10-CM | POA: Diagnosis not present

## 2021-11-02 DIAGNOSIS — Z9181 History of falling: Secondary | ICD-10-CM | POA: Diagnosis not present

## 2021-11-02 DIAGNOSIS — F32A Depression, unspecified: Secondary | ICD-10-CM | POA: Diagnosis not present

## 2021-11-02 DIAGNOSIS — J449 Chronic obstructive pulmonary disease, unspecified: Secondary | ICD-10-CM | POA: Diagnosis not present

## 2021-11-02 DIAGNOSIS — B0223 Postherpetic polyneuropathy: Secondary | ICD-10-CM | POA: Diagnosis not present

## 2021-11-03 DIAGNOSIS — M25532 Pain in left wrist: Secondary | ICD-10-CM | POA: Diagnosis not present

## 2021-11-04 ENCOUNTER — Ambulatory Visit (INDEPENDENT_AMBULATORY_CARE_PROVIDER_SITE_OTHER): Payer: Medicare Other | Admitting: Adult Health

## 2021-11-04 VITALS — BP 132/72 | HR 62 | Ht 63.0 in | Wt 122.0 lb

## 2021-11-04 DIAGNOSIS — B028 Zoster with other complications: Secondary | ICD-10-CM

## 2021-11-04 DIAGNOSIS — S59202D Unspecified physeal fracture of lower end of radius, left arm, subsequent encounter for fracture with routine healing: Secondary | ICD-10-CM | POA: Diagnosis not present

## 2021-11-04 DIAGNOSIS — R531 Weakness: Secondary | ICD-10-CM | POA: Diagnosis not present

## 2021-11-04 DIAGNOSIS — B0223 Postherpetic polyneuropathy: Secondary | ICD-10-CM | POA: Diagnosis not present

## 2021-11-04 DIAGNOSIS — C8590 Non-Hodgkin lymphoma, unspecified, unspecified site: Secondary | ICD-10-CM | POA: Diagnosis not present

## 2021-11-04 DIAGNOSIS — Z9181 History of falling: Secondary | ICD-10-CM | POA: Diagnosis not present

## 2021-11-04 DIAGNOSIS — S59202A Unspecified physeal fracture of lower end of radius, left arm, initial encounter for closed fracture: Secondary | ICD-10-CM

## 2021-11-04 DIAGNOSIS — Z9981 Dependence on supplemental oxygen: Secondary | ICD-10-CM | POA: Diagnosis not present

## 2021-11-04 DIAGNOSIS — J449 Chronic obstructive pulmonary disease, unspecified: Secondary | ICD-10-CM | POA: Diagnosis not present

## 2021-11-04 DIAGNOSIS — F32A Depression, unspecified: Secondary | ICD-10-CM | POA: Diagnosis not present

## 2021-11-04 NOTE — Progress Notes (Signed)
Subjective:    [name], female    DOB: 04-Dec-1940, 81 y.o.   MRN: [id number]  HPI  81 year old female who  has a past medical history of Cancer ([place]) ( APRIL OF 2000), COPD (chronic obstructive pulmonary disease) ([place]), Depression, Emphysema of lung ([place]), GERD (gastroesophageal reflux disease), Hyperlipidemia, and S/p left hip fracture (04/15/2019).  She presents with her husband today for follow up after   She was seen in the ER on 09/30/2021 after a fall at home and shingles outbreak.  She had a large shinges outbreak on her buttock and thigh. Was treated with valtrex and doxycycline by her dermatologist.   Follow-up secondary to significant generalized weakness with recent and fairly extensive shingles outbreak.  Her fall resulted in a distal radial fracture with slight displacement and impaction.  She reports that she was able to take antivirals for 2 days before she developed vomiting and had to stop the medication.  Due to generalized weakness she was admitted to the rehab facility for 2-1/2 weeks.  This was a horrible experience for the patient and does not feel as though she got the help that was needed  She currently has physical therapy and home health care and feels as though this has been very beneficial.  She and her husband both report significant improvement over the last few days.  Her shingles rash has improved significantly but continues to have an open and healing wound on her right buttocks that home health is managing.  She does have intermittent burning pain on the underside of her right leg and from the shingles outbreak.  Has not been having to use much in the way of pain medication at home.  She does have an appointment with Dr. Greta Doom at [facility] relating to her left radial fracture.  Wt Readings from Last 3 Encounters:  11/04/21 122 lb (55.3 kg)  09/30/21 125 lb (56.7 kg)  07/30/21 132 lb 12.8 oz (60.2 kg)   Review of Systems See HPI    Past Medical History:  Diagnosis Date   Cancer ([place])  APRIL OF 2000   LOW-GRADE NON-HODGKIN'S LYMPHOMA   COPD (chronic obstructive pulmonary disease) (HCC)    Depression    Emphysema of lung (HCC)    GERD (gastroesophageal reflux disease)    Hyperlipidemia    S/p left hip fracture 04/15/2019    Social History   Socioeconomic History   Marital status: Married    Spouse name: Not on file   Number of children: Not on file   Years of education: Not on file   Highest education level: Bachelor's degree (e.g., BA, AB, BS)  Occupational History   Not on file  Tobacco Use   Smoking status: Former    Packs/day: 0.25    Years: 51.00    Pack years: 12.75    Types: Cigarettes    Quit date: 03/12/2015    Years since quitting: 6.6   Smokeless tobacco: Never  Vaping Use   Vaping Use: Some days  Substance and Sexual Activity   Alcohol use: Yes    Alcohol/week: 0.0 standard drinks    Comment: 1 glass of wine monthly   Drug use: No   Sexual activity: Not Currently    Birth control/protection: Post-menopausal  Other Topics Concern   Not on file  Social History Narrative   Married 51 years    One daughter who lives in [place], [place]   - Photographer for mens and womens  clothing   - 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       Pets: Dog      Diet: Healthy diet, fruits, veggies and fish.    Exercise: None       Social Determinants of Health   Financial Resource Strain: Low Risk    Difficulty of Paying Living Expenses: Not hard at all  Food Insecurity: No Food Insecurity   Worried About Charity fundraiser in the Last Year: Never true   Arboriculturist in the Last Year: Never true  Transportation Needs: No Transportation Needs   Lack of Transportation (Medical): No   Lack of Transportation (Non-Medical): No  Physical Activity: Inactive   Days of Exercise per Week: 0 days   Minutes of Exercise per Session: 0 min   Stress: No Stress Concern Present   Feeling of Stress : Not at all  Social Connections: Moderately Isolated   Frequency of Communication with Friends and Family: More than three times a week   Frequency of Social Gatherings with Friends and Family: More than three times a week   Attends Religious Services: Never   Marine scientist or Organizations: No   Attends Music therapist: Never   Marital Status: Married  Human resources officer Violence: Not on file    Past Surgical History:  Procedure Laterality Date   CYSTECTOMY W/ URETEROILEAL CONDUIT  1997   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   FRACTURE SURGERY     IR FLUORO GUIDE PORT INSERTION RIGHT  12/07/2017   IR REMOVAL TUN ACCESS W/ PORT W/O FL MOD SED  11/04/2020   IR US GUIDE VASC ACCESS RIGHT  12/07/2017   ORIF HUMERUS FRACTURE Right 11/23/2017   Procedure: OPEN REDUCTION INTERNAL FIXATION (ORIF) HUMERAL SHAFT FRACTURE;  Surgeon: Shona Needles, MD;  Location: [facility];  Service: Orthopedics;  Laterality: Right;   RIGHT OOPHORECTOMY  1976   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   Procedure: TOTAL HIP ARTHROPLASTY ANTERIOR APPROACH LEFT HIP;  Surgeon: Rod Can, MD;  Location: [facility];  Service: Orthopedics;  Laterality: Left;    Family History  Problem Relation Age of Onset   Cancer Mother        colon ca    Allergies  Allergen Reactions   Contrast Media [Iodinated Contrast Media] Shortness Of Breath, Rash and Other (See Comments)    Throat swells   Iohexol Shortness Of Breath   Povidone-Iodine Hives, Shortness Of Breath and Rash    REACTION: throat swells   Penicillins Hives, Itching and Rash    Has patient had a PCN reaction causing immediate rash, facial/tongue/throat swelling, SOB or lightheadedness with hypotension: Unknown Has patient had a PCN reaction causing severe rash involving mucus membranes or skin necrosis: Yes Has patient had a PCN reaction that required  hospitalization: Unknown Has patient had a PCN reaction occurring within the last 10 years: Unknown If all of the above answers are "NO", then may proceed with Cephalosporin use.    Adhesive [Tape] Hives    Current Outpatient Medications on File Prior to Visit  Medication Sig Dispense Refill   aspirin EC 81 MG tablet Take 81 mg by mouth daily.     cholecalciferol (VITAMIN D) 1000 units tablet Take 2,000 Units by mouth daily.     sertraline (ZOLOFT) 25 MG tablet TAKE  1 TABLET BY MOUTH DAILY (Patient taking differently: Take 25 mg by mouth daily.) 90 tablet 1   triamcinolone ointment (KENALOG) 0.5 % Apply 1 application topically 2 (two) times daily. 30 g 2   albuterol (PROVENTIL HFA;VENTOLIN HFA) 108 (90 Base) MCG/ACT inhaler Inhale 2 puffs into the lungs every 6 (six) hours as needed for wheezing or shortness of breath. 1 Inhaler 0   Fluticasone-Umeclidin-Vilant (TRELEGY ELLIPTA) 100-62.5-25 MCG/ACT AEPB Inhale 1 puff into the lungs daily. 1 each 0   lidocaine (XYLOCAINE) 5 % ointment Apply 1 application topically 4 (four) times daily as needed for mild pain. 240 g 1   triamcinolone 0.5%-Eucerin equivalent 1:1 cream mixture Apply topically 2 (two) times daily. 454 g 2   valACYclovir (VALTREX) 1000 MG tablet Take 1,000 mg by mouth 3 (three) times daily. for shingles     No current facility-administered medications on file prior to visit.    BP 132/72 (BP Location: Right Arm, Patient Position: Sitting, Cuff Size: Normal)    Pulse 62    Ht 5\' 3"  (1.6 m)    Wt 122 lb (55.3 kg) Comment: patient reported   LMP  (LMP Unknown)    SpO2 95%    BMI 21.61 kg/m       Objective:   Physical Exam Vitals and nursing note reviewed.  Constitutional:      Appearance: Normal appearance. She is underweight.  Cardiovascular:     Rate and Rhythm: Normal rate and regular rhythm.     Pulses: Normal pulses.     Heart sounds: Normal heart sounds.  Pulmonary:     Effort: Pulmonary effort is normal.      Breath sounds: Normal breath sounds.  Skin:    General: Skin is warm and dry.          Comments: Well healing wound on left lower back/buttock. No signs of infection. Scant clear drainage noted   Neurological:     General: No focal deficit present.     Mental Status: She is alert and oriented to person, place, and time.     Motor: Weakness present.  Psychiatric:        Mood and Affect: Mood normal.        Behavior: Behavior normal.        Thought Content: Thought content normal.        Judgment: Judgment normal.      Assessment & Plan:  1. Herpes zoster with other complication - Appears to be healing well.  - Follow up with signs of infection   2. Displaced physeal fracture of distal end of left radius, initial encounter - Follow up with orthopedics as directed  3. Weakness - Continue to work with home health - I am happy to hear she has made improvement in the last week. I have no doubt she will return to baseline   [name], NP

## 2021-11-05 ENCOUNTER — Ambulatory Visit: Payer: Medicare Other | Admitting: Adult Health

## 2021-11-05 DIAGNOSIS — Z9181 History of falling: Secondary | ICD-10-CM | POA: Diagnosis not present

## 2021-11-05 DIAGNOSIS — C8590 Non-Hodgkin lymphoma, unspecified, unspecified site: Secondary | ICD-10-CM | POA: Diagnosis not present

## 2021-11-05 DIAGNOSIS — Z9981 Dependence on supplemental oxygen: Secondary | ICD-10-CM | POA: Diagnosis not present

## 2021-11-05 DIAGNOSIS — B0223 Postherpetic polyneuropathy: Secondary | ICD-10-CM | POA: Diagnosis not present

## 2021-11-05 DIAGNOSIS — J449 Chronic obstructive pulmonary disease, unspecified: Secondary | ICD-10-CM | POA: Diagnosis not present

## 2021-11-05 DIAGNOSIS — F32A Depression, unspecified: Secondary | ICD-10-CM | POA: Diagnosis not present

## 2021-11-06 ENCOUNTER — Ambulatory Visit: Payer: Medicare Other | Admitting: Adult Health

## 2021-11-06 DIAGNOSIS — B0223 Postherpetic polyneuropathy: Secondary | ICD-10-CM | POA: Diagnosis not present

## 2021-11-06 DIAGNOSIS — F32A Depression, unspecified: Secondary | ICD-10-CM | POA: Diagnosis not present

## 2021-11-06 DIAGNOSIS — J449 Chronic obstructive pulmonary disease, unspecified: Secondary | ICD-10-CM | POA: Diagnosis not present

## 2021-11-06 DIAGNOSIS — Z9981 Dependence on supplemental oxygen: Secondary | ICD-10-CM | POA: Diagnosis not present

## 2021-11-06 DIAGNOSIS — C8590 Non-Hodgkin lymphoma, unspecified, unspecified site: Secondary | ICD-10-CM | POA: Diagnosis not present

## 2021-11-06 DIAGNOSIS — Z9181 History of falling: Secondary | ICD-10-CM | POA: Diagnosis not present

## 2021-11-09 DIAGNOSIS — Z9981 Dependence on supplemental oxygen: Secondary | ICD-10-CM | POA: Diagnosis not present

## 2021-11-09 DIAGNOSIS — C8590 Non-Hodgkin lymphoma, unspecified, unspecified site: Secondary | ICD-10-CM | POA: Diagnosis not present

## 2021-11-09 DIAGNOSIS — F32A Depression, unspecified: Secondary | ICD-10-CM | POA: Diagnosis not present

## 2021-11-09 DIAGNOSIS — Z9181 History of falling: Secondary | ICD-10-CM | POA: Diagnosis not present

## 2021-11-09 DIAGNOSIS — J449 Chronic obstructive pulmonary disease, unspecified: Secondary | ICD-10-CM | POA: Diagnosis not present

## 2021-11-09 DIAGNOSIS — B0223 Postherpetic polyneuropathy: Secondary | ICD-10-CM | POA: Diagnosis not present

## 2021-11-10 ENCOUNTER — Other Ambulatory Visit: Payer: Self-pay

## 2021-11-10 ENCOUNTER — Telehealth: Payer: Self-pay

## 2021-11-10 ENCOUNTER — Other Ambulatory Visit: Payer: Self-pay | Admitting: Adult Health

## 2021-11-10 DIAGNOSIS — Z9981 Dependence on supplemental oxygen: Secondary | ICD-10-CM | POA: Diagnosis not present

## 2021-11-10 DIAGNOSIS — B0223 Postherpetic polyneuropathy: Secondary | ICD-10-CM | POA: Diagnosis not present

## 2021-11-10 DIAGNOSIS — Z9181 History of falling: Secondary | ICD-10-CM | POA: Diagnosis not present

## 2021-11-10 DIAGNOSIS — J449 Chronic obstructive pulmonary disease, unspecified: Secondary | ICD-10-CM | POA: Diagnosis not present

## 2021-11-10 DIAGNOSIS — C8590 Non-Hodgkin lymphoma, unspecified, unspecified site: Secondary | ICD-10-CM | POA: Diagnosis not present

## 2021-11-10 DIAGNOSIS — F32A Depression, unspecified: Secondary | ICD-10-CM | POA: Diagnosis not present

## 2021-11-10 MED ORDER — TRIAMCINOLONE ACETONIDE 0.5 % EX CREA: 1.0000 "application " | TOPICAL_CREAM | Freq: Three times a day (TID) | CUTANEOUS | 0 refills | Status: DC

## 2021-11-10 NOTE — Telephone Encounter (Signed)
°  Spoke to pt and advised that we are working on the Rx. Pt verbalized understanding. Pt stated that we do not have to worry refilling the lidocaine cream for his wife bc it is not helping pt. Pt is only needing the triamcinolone cream for his wife. Message will also be placed in pt spouse chart sine the request if for her.          ell "Mikki Santee"  P [facility] Clinical Pool (supporting Nafziger, Tommi Rumps, NP) 54 minutes ago (3:39 PM)   Tillie Rung, If [employer] rewrite the prescription for cream it's ok.  But I need to know so I can try to get some from another source. Request was January 26 and still don't know.  I will be happy to return the ointment to your office if you wish.      You  Robert L Lennox "Mikki Santee" Yesterday (9:00 AM)   Faythe Ghee got it! In the future when you all come in, remind [place] that you are not able to get into her chart so that we can help you. Or you can call the [facility] helpdesk [phone]. They should be able to help with that. I will look into it now. Thank you!    Gerrit Friends "Mikki Santee"  P Lbf Clinical Pool (supporting Nafziger, [place], NP) 4 days ago   Tillie Rung, the cream was for my wife, not me.  [facility] [birth year][facility] Cory sent two prescriptions into [facility] last week for 2x15 grams of Triamcinolone OINTMENT 0.1% and 1 tube of Lidocaine ointment.  I am sure he knows about it.   My wife can't use OINTMENT on her shingles.  It has to be cream.   I don't have access to my wife's MyCharts and the reason I sent the request on mine.    You  Gerrit Friends "Mikki Santee" 4 days ago   Hello,   Tried calling you but no answer. I didn't not leave a message. So it looks like Tommi Rumps has never filled this for you. Are you sure you received it from this office?     Gerrit Friends "Mikki Santee"  P L[facility] Clinical Pool (supporting Nafziger, [employer] [employer], NP) 5 days ago   , thank you for your time and checking Gussie and providing direction to get her well.  She was in such a good mood for the rest of the day.      We are almost out of Triamcinolone 0.1% CREAM.  I think the Healthcare nurse that called the prescription into your office must have ordered ointment for both the Triamcinolone and the Lidocaine.  I remember showing her that it was cream.  Gussie needs cream for her lesions.  Would you mind transmitting a prescription to [facility] at [address] for the CREAM form of the Triamcinolone 0.1% please.  We would be grateful.   Thank you. Cyndie Mull

## 2021-11-11 DIAGNOSIS — Z23 Encounter for immunization: Secondary | ICD-10-CM | POA: Diagnosis not present

## 2021-11-11 DIAGNOSIS — Z9181 History of falling: Secondary | ICD-10-CM | POA: Diagnosis not present

## 2021-11-11 DIAGNOSIS — B0223 Postherpetic polyneuropathy: Secondary | ICD-10-CM | POA: Diagnosis not present

## 2021-11-11 DIAGNOSIS — Z9981 Dependence on supplemental oxygen: Secondary | ICD-10-CM | POA: Diagnosis not present

## 2021-11-11 DIAGNOSIS — B029 Zoster without complications: Secondary | ICD-10-CM | POA: Diagnosis not present

## 2021-11-11 DIAGNOSIS — C8590 Non-Hodgkin lymphoma, unspecified, unspecified site: Secondary | ICD-10-CM | POA: Diagnosis not present

## 2021-11-11 DIAGNOSIS — J449 Chronic obstructive pulmonary disease, unspecified: Secondary | ICD-10-CM | POA: Diagnosis not present

## 2021-11-11 DIAGNOSIS — T1490XD Injury, unspecified, subsequent encounter: Secondary | ICD-10-CM | POA: Diagnosis not present

## 2021-11-11 DIAGNOSIS — F32A Depression, unspecified: Secondary | ICD-10-CM | POA: Diagnosis not present

## 2021-11-11 NOTE — Telephone Encounter (Signed)
Noted  

## 2021-11-12 DIAGNOSIS — B0223 Postherpetic polyneuropathy: Secondary | ICD-10-CM | POA: Diagnosis not present

## 2021-11-12 DIAGNOSIS — J449 Chronic obstructive pulmonary disease, unspecified: Secondary | ICD-10-CM | POA: Diagnosis not present

## 2021-11-12 DIAGNOSIS — Z9981 Dependence on supplemental oxygen: Secondary | ICD-10-CM | POA: Diagnosis not present

## 2021-11-12 DIAGNOSIS — Z9181 History of falling: Secondary | ICD-10-CM | POA: Diagnosis not present

## 2021-11-12 DIAGNOSIS — C8590 Non-Hodgkin lymphoma, unspecified, unspecified site: Secondary | ICD-10-CM | POA: Diagnosis not present

## 2021-11-12 DIAGNOSIS — F32A Depression, unspecified: Secondary | ICD-10-CM | POA: Diagnosis not present

## 2021-11-16 DIAGNOSIS — B0223 Postherpetic polyneuropathy: Secondary | ICD-10-CM | POA: Diagnosis not present

## 2021-11-16 DIAGNOSIS — J449 Chronic obstructive pulmonary disease, unspecified: Secondary | ICD-10-CM | POA: Diagnosis not present

## 2021-11-16 DIAGNOSIS — Z9181 History of falling: Secondary | ICD-10-CM | POA: Diagnosis not present

## 2021-11-16 DIAGNOSIS — Z9981 Dependence on supplemental oxygen: Secondary | ICD-10-CM | POA: Diagnosis not present

## 2021-11-16 DIAGNOSIS — C8590 Non-Hodgkin lymphoma, unspecified, unspecified site: Secondary | ICD-10-CM | POA: Diagnosis not present

## 2021-11-16 DIAGNOSIS — F32A Depression, unspecified: Secondary | ICD-10-CM | POA: Diagnosis not present

## 2021-11-17 DIAGNOSIS — B0223 Postherpetic polyneuropathy: Secondary | ICD-10-CM | POA: Diagnosis not present

## 2021-11-17 DIAGNOSIS — F32A Depression, unspecified: Secondary | ICD-10-CM | POA: Diagnosis not present

## 2021-11-17 DIAGNOSIS — C8590 Non-Hodgkin lymphoma, unspecified, unspecified site: Secondary | ICD-10-CM | POA: Diagnosis not present

## 2021-11-17 DIAGNOSIS — J449 Chronic obstructive pulmonary disease, unspecified: Secondary | ICD-10-CM | POA: Diagnosis not present

## 2021-11-17 DIAGNOSIS — Z9181 History of falling: Secondary | ICD-10-CM | POA: Diagnosis not present

## 2021-11-17 DIAGNOSIS — Z9981 Dependence on supplemental oxygen: Secondary | ICD-10-CM | POA: Diagnosis not present

## 2021-11-18 DIAGNOSIS — Z9981 Dependence on supplemental oxygen: Secondary | ICD-10-CM | POA: Diagnosis not present

## 2021-11-18 DIAGNOSIS — B0223 Postherpetic polyneuropathy: Secondary | ICD-10-CM | POA: Diagnosis not present

## 2021-11-18 DIAGNOSIS — C8590 Non-Hodgkin lymphoma, unspecified, unspecified site: Secondary | ICD-10-CM | POA: Diagnosis not present

## 2021-11-18 DIAGNOSIS — J449 Chronic obstructive pulmonary disease, unspecified: Secondary | ICD-10-CM | POA: Diagnosis not present

## 2021-11-18 DIAGNOSIS — F32A Depression, unspecified: Secondary | ICD-10-CM | POA: Diagnosis not present

## 2021-11-18 DIAGNOSIS — Z9181 History of falling: Secondary | ICD-10-CM | POA: Diagnosis not present

## 2021-11-19 ENCOUNTER — Telehealth: Payer: Self-pay | Admitting: Adult Health

## 2021-11-19 ENCOUNTER — Other Ambulatory Visit: Payer: Self-pay

## 2021-11-19 ENCOUNTER — Telehealth (INDEPENDENT_AMBULATORY_CARE_PROVIDER_SITE_OTHER): Payer: Medicare Other | Admitting: Internal Medicine

## 2021-11-19 ENCOUNTER — Encounter: Payer: Self-pay | Admitting: Internal Medicine

## 2021-11-19 VITALS — HR 110 | Temp 101.2°F | Ht 63.0 in | Wt 122.0 lb

## 2021-11-19 DIAGNOSIS — Z9981 Dependence on supplemental oxygen: Secondary | ICD-10-CM | POA: Diagnosis not present

## 2021-11-19 DIAGNOSIS — B0223 Postherpetic polyneuropathy: Secondary | ICD-10-CM | POA: Diagnosis not present

## 2021-11-19 DIAGNOSIS — N3001 Acute cystitis with hematuria: Secondary | ICD-10-CM | POA: Diagnosis not present

## 2021-11-19 DIAGNOSIS — F32A Depression, unspecified: Secondary | ICD-10-CM | POA: Diagnosis not present

## 2021-11-19 DIAGNOSIS — U071 COVID-19: Secondary | ICD-10-CM | POA: Diagnosis not present

## 2021-11-19 DIAGNOSIS — C8590 Non-Hodgkin lymphoma, unspecified, unspecified site: Secondary | ICD-10-CM | POA: Diagnosis not present

## 2021-11-19 DIAGNOSIS — Z9181 History of falling: Secondary | ICD-10-CM | POA: Diagnosis not present

## 2021-11-19 DIAGNOSIS — J449 Chronic obstructive pulmonary disease, unspecified: Secondary | ICD-10-CM | POA: Diagnosis not present

## 2021-11-19 LAB — POCT URINALYSIS DIPSTICK
Bilirubin, UA: 4
Ketones, UA: 5
Nitrite, UA: POSITIVE
Protein, UA: POSITIVE — AB
Spec Grav, UA: 1.015 (ref 1.010–1.025)
Urobilinogen, UA: >=8 E.U./dL — AB
pH, UA: 7 (ref 5.0–8.0)

## 2021-11-19 MED ORDER — SULFAMETHOXAZOLE-TRIMETHOPRIM 800-160 MG PO TABS: 1.0000 | ORAL_TABLET | Freq: Two times a day (BID) | ORAL | 0 refills | Status: AC

## 2021-11-19 MED ORDER — MOLNUPIRAVIR EUA 200MG CAPSULE: 4.0000 | ORAL_CAPSULE | Freq: Two times a day (BID) | ORAL | 0 refills | Status: AC

## 2021-11-19 NOTE — Telephone Encounter (Signed)
[name] with [facility] called in requesting for Brooke Washington to give them the ok to get an urinalysis sample on patient because she's showing signs of having an UTI.  I offered appointment for patient to be seen today by another provider but they declined because the patient fell prior to the phone call.  [name] could be contacted at [phone].  Please advise.

## 2021-11-19 NOTE — Telephone Encounter (Signed)
Okay for verbal orders? Please advise 

## 2021-11-19 NOTE — Addendum Note (Signed)
Addended by: Rosalyn Gess D on: 11/19/2021 04:20 PM   Modules accepted: Orders

## 2021-11-19 NOTE — Progress Notes (Signed)
Virtual Visit via Telephone Note  I connected with Brooke Washington on 11/19/21 at  4:00 PM EST by telephone and verified that I am speaking with the correct person using two identifiers.   I discussed the limitations, risks, security and privacy concerns of performing an evaluation and management service by telephone and the availability of in person appointments. I also discussed with the patient that there may be a patient responsible charge related to this service. The patient expressed understanding and agreed to proceed.  Location patient: home Location provider: work office Participants present for the call: patient, provider, husband Brooke Washington Patient did not have a visit in the prior 7 days to address this/these issue(s).   History of Present Illness:  She spent all last night with a cough.  This morning she has had extreme fatigue and weakness.  While walking to the bathroom she fell at home and they had to call 911 to put her back in her chair.  They did not recommend emergency transport.  She had another fall today without injury.  Husband describes that she felt like dead weight in his arms and so he slowly slid her down to the ground.  Home health PT thought she might have a UTI and they submitted sample today for testing.  Urine dipstick is definitely indicative of a UTI with positive blood, nitrates and leukocytes.  As we are finishing our conversation he tells me that he was processing her COVID test while we were on the phone and it is faintly positive.   Observations/Objective: Patient sounds fatigued on the phone I do not appreciate any increased work of breathing. Speech and thought processing are grossly intact. Patient reported vitals: None reported   Current Outpatient Medications:    albuterol (PROVENTIL HFA;VENTOLIN HFA) 108 (90 Base) MCG/ACT inhaler, Inhale 2 puffs into the lungs every 6 (six) hours as needed for wheezing or shortness of breath., Disp: 1  Inhaler, Rfl: 0   aspirin EC 81 MG tablet, Take 81 mg by mouth daily., Disp: , Rfl:    cholecalciferol (VITAMIN D) 1000 units tablet, Take 2,000 Units by mouth daily., Disp: , Rfl:    Fluticasone-Umeclidin-Vilant (TRELEGY ELLIPTA) 100-62.5-25 MCG/ACT AEPB, Inhale 1 puff into the lungs daily., Disp: 1 each, Rfl: 0   molnupiravir EUA (LAGEVRIO) 200 mg CAPS capsule, Take 4 capsules (800 mg total) by mouth 2 (two) times daily for 5 days., Disp: 40 capsule, Rfl: 0   sertraline (ZOLOFT) 25 MG tablet, TAKE 1 TABLET BY MOUTH DAILY (Patient taking differently: Take 25 mg by mouth daily.), Disp: 90 tablet, Rfl: 1   sulfamethoxazole-trimethoprim (BACTRIM DS) 800-160 MG tablet, Take 1 tablet by mouth 2 (two) times daily for 7 days., Disp: 14 tablet, Rfl: 0  Review of Systems:  Constitutional: Denies fever, chills, diaphoresis. HEENT: Denies photophobia, eye pain, redness,  mouth sores, trouble swallowing, neck pain, neck stiffness and tinnitus.   Respiratory: Denies SOB, DOE,  chest tightness,  and wheezing.   Cardiovascular: Denies chest pain, palpitations and leg swelling.  Gastrointestinal: Denies nausea, vomiting, abdominal pain, diarrhea, constipation, blood in stool and abdominal distention.  Genitourinary: Positive for dysuria, urgency, frequency, hematuria, flank pain and difficulty urinating.  Endocrine: Denies: hot or cold intolerance, sweats, changes in hair or nails, polyuria, polydipsia. Musculoskeletal: Denies myalgias, back pain, joint swelling, arthralgias and gait problem.  Skin: Denies pallor, rash and wound.  Neurological: Denies dizziness, seizures, syncope, weakness, light-headedness, numbness and headaches.  Hematological: Denies adenopathy. Easy bruising,  personal or family bleeding history  Psychiatric/Behavioral: Denies suicidal ideation, mood changes, confusion, nervousness, sleep disturbance and agitation   Assessment and Plan:  Acute cystitis with hematuria - Plan: POC  Urinalysis Dipstick, Culture, Urine, sulfamethoxazole-trimethoprim (BACTRIM DS) 800-160 MG tablet  COVID-19 - Plan: molnupiravir EUA (LAGEVRIO) 200 mg CAPS capsule  -Looks like she has both a UTI and BHALP-37 which would certainly explain her extreme weakness and fatigue especially at her advanced age. -I have sent in a prescription for Bactrim as well as molnupiravir.  Have not sent in Paxlovid given her GFR of 47. -Have discussed with husband that if weakness persist, she is a unable to tolerate oral food and drink, develops fever or shortness of breath that she should proceed to the emergency department immediately.    I discussed the assessment and treatment plan with the patient. The patient was provided an opportunity to ask questions and all were answered. The patient agreed with the plan and demonstrated an understanding of the instructions.   The patient was advised to call back or seek an in-person evaluation if the symptoms worsen or if the condition fails to improve as anticipated.  I provided 22 minutes of non-face-to-face time during this encounter.   Lelon Frohlich, MD [facility]

## 2021-11-20 ENCOUNTER — Encounter (HOSPITAL_BASED_OUTPATIENT_CLINIC_OR_DEPARTMENT_OTHER): Payer: Self-pay | Admitting: Emergency Medicine

## 2021-11-20 ENCOUNTER — Encounter: Payer: Self-pay | Admitting: Adult Health

## 2021-11-20 ENCOUNTER — Other Ambulatory Visit: Payer: Self-pay

## 2021-11-20 ENCOUNTER — Emergency Department (HOSPITAL_BASED_OUTPATIENT_CLINIC_OR_DEPARTMENT_OTHER): Payer: Medicare Other | Admitting: Radiology

## 2021-11-20 ENCOUNTER — Emergency Department (HOSPITAL_BASED_OUTPATIENT_CLINIC_OR_DEPARTMENT_OTHER)
Admission: EM | Admit: 2021-11-20 | Discharge: 2021-11-20 | Disposition: A | Discharge status: Home / Self Care | Payer: Medicare Other | Attending: Emergency Medicine | Admitting: Emergency Medicine

## 2021-11-20 DIAGNOSIS — Z9981 Dependence on supplemental oxygen: Secondary | ICD-10-CM | POA: Insufficient documentation

## 2021-11-20 DIAGNOSIS — Z7982 Long term (current) use of aspirin: Secondary | ICD-10-CM | POA: Diagnosis not present

## 2021-11-20 DIAGNOSIS — N390 Urinary tract infection, site not specified: Secondary | ICD-10-CM | POA: Diagnosis not present

## 2021-11-20 DIAGNOSIS — R0602 Shortness of breath: Secondary | ICD-10-CM | POA: Diagnosis present

## 2021-11-20 DIAGNOSIS — Z79899 Other long term (current) drug therapy: Secondary | ICD-10-CM | POA: Diagnosis not present

## 2021-11-20 DIAGNOSIS — R531 Weakness: Secondary | ICD-10-CM | POA: Diagnosis not present

## 2021-11-20 DIAGNOSIS — U071 COVID-19: Secondary | ICD-10-CM | POA: Diagnosis not present

## 2021-11-20 DIAGNOSIS — R000 Tachycardia, unspecified: Secondary | ICD-10-CM | POA: Diagnosis not present

## 2021-11-20 DIAGNOSIS — R0902 Hypoxemia: Secondary | ICD-10-CM | POA: Diagnosis not present

## 2021-11-20 LAB — CBC WITH DIFFERENTIAL/PLATELET
Abs Immature Granulocytes: 0.03 10*3/uL (ref 0.00–0.07)
Basophils Absolute: 0 10*3/uL (ref 0.0–0.1)
Basophils Relative: 1 %
Eosinophils Absolute: 0.1 10*3/uL (ref 0.0–0.5)
Eosinophils Relative: 1 %
HCT: 38.7 % (ref 36.0–46.0)
Hemoglobin: 12.5 g/dL (ref 12.0–15.0)
Immature Granulocytes: 1 %
Lymphocytes Relative: 19 %
Lymphs Abs: 1 10*3/uL (ref 0.7–4.0)
MCH: 32.4 pg (ref 26.0–34.0)
MCHC: 32.3 g/dL (ref 30.0–36.0)
MCV: 100.3 fL — ABNORMAL HIGH (ref 80.0–100.0)
Monocytes Absolute: 1.2 10*3/uL — ABNORMAL HIGH (ref 0.1–1.0)
Monocytes Relative: 23 %
Neutro Abs: 2.8 10*3/uL (ref 1.7–7.7)
Neutrophils Relative %: 55 %
Platelets: 264 10*3/uL (ref 150–400)
RBC: 3.86 MIL/uL — ABNORMAL LOW (ref 3.87–5.11)
RDW: 15.4 % (ref 11.5–15.5)
WBC: 5.1 10*3/uL (ref 4.0–10.5)
nRBC: 0 % (ref 0.0–0.2)

## 2021-11-20 LAB — URINALYSIS, ROUTINE W REFLEX MICROSCOPIC
Bilirubin Urine: NEGATIVE
Glucose, UA: NEGATIVE mg/dL
Hgb urine dipstick: NEGATIVE
Ketones, ur: NEGATIVE mg/dL
Nitrite: NEGATIVE
Specific Gravity, Urine: 1.02 (ref 1.005–1.030)
pH: 5.5 (ref 5.0–8.0)

## 2021-11-20 LAB — COMPREHENSIVE METABOLIC PANEL
ALT: 7 U/L (ref 0–44)
AST: 17 U/L (ref 15–41)
Albumin: 3.7 g/dL (ref 3.5–5.0)
Alkaline Phosphatase: 91 U/L (ref 38–126)
Anion gap: 11 (ref 5–15)
BUN: 14 mg/dL (ref 8–23)
CO2: 26 mmol/L (ref 22–32)
Calcium: 8.9 mg/dL (ref 8.9–10.3)
Chloride: 99 mmol/L (ref 98–111)
Creatinine, Ser: 0.94 mg/dL (ref 0.44–1.00)
GFR, Estimated: >60 mL/min (ref >60)
Glucose, Bld: 84 mg/dL (ref 70–99)
Potassium: 3.3 mmol/L — ABNORMAL LOW (ref 3.5–5.1)
Sodium: 136 mmol/L (ref 135–145)
Total Bilirubin: 0.3 mg/dL (ref 0.3–1.2)
Total Protein: 5.7 g/dL — ABNORMAL LOW (ref 6.5–8.1)

## 2021-11-20 LAB — RESP PANEL BY RT-PCR (FLU A&B, COVID) ARPGX2
Influenza A by PCR: NEGATIVE
Influenza B by PCR: NEGATIVE
SARS Coronavirus 2 by RT PCR: POSITIVE — AB

## 2021-11-20 LAB — BRAIN NATRIURETIC PEPTIDE: B Natriuretic Peptide: 76.8 pg/mL (ref 0.0–100.0)

## 2021-11-20 LAB — TROPONIN I (HIGH SENSITIVITY)
Troponin I (High Sensitivity): 8 ng/L (ref <18)
Troponin I (High Sensitivity): 7 ng/L (ref <18)

## 2021-11-20 LAB — MAGNESIUM: Magnesium: 1.9 mg/dL (ref 1.7–2.4)

## 2021-11-20 MED ORDER — ALBUTEROL SULFATE HFA 108 (90 BASE) MCG/ACT IN AERS
2.0000 | INHALATION_SPRAY | RESPIRATORY_TRACT | Status: DC | PRN
  Administered 2021-11-20: 2 via RESPIRATORY_TRACT
  Filled 2021-11-20: qty 6.7

## 2021-11-20 MED ORDER — POTASSIUM CHLORIDE CRYS ER 20 MEQ PO TBCR
40.0000 meq | EXTENDED_RELEASE_TABLET | Freq: Once | ORAL | Status: AC
  Administered 2021-11-20: 40 meq via ORAL
  Filled 2021-11-20: qty 2

## 2021-11-20 MED ORDER — SODIUM CHLORIDE 0.9 % IV SOLN
1.0000 g | Freq: Once | INTRAVENOUS | Status: AC
  Administered 2021-11-20: 1 g via INTRAVENOUS
  Filled 2021-11-20: qty 10

## 2021-11-20 NOTE — Telephone Encounter (Signed)
Spoke to pt spouse and he stated she is taking medication and eating very little. Also, Pt has started both medications. Pt is unable to walk and almost had a fall. Pt spouse stated that he does not know what to do and is wondering if pt should go to the hospital. Will send to [facility] for advise.

## 2021-11-20 NOTE — ED Triage Notes (Signed)
Pt c/o DOE that has been going on for "a while."  Pt reports weakness and general fatigue for past week, worsening each day.  Pt AAOx4, in NAD at this time.

## 2021-11-20 NOTE — ED Provider Notes (Addendum)
[facility] EMERGENCY DEPT Provider Note   CSN: [id number] Arrival date & time: 11/20/21  1531     History  Chief Complaint  Patient presents with   Weakness   Shortness of Breath    Brooke Washington is a 81 y.o. female.  Patient presents ER chief complaint of generalized fatigue shortness of breath.  Denies any pain.  She has had a cough for about 2 days.  She had fever for 2 days as well.  She was seen by her primary care doctor diagnosed with urinary tract infection and just started on Bactrim.  She tested positive for COVID today and was sent to the ER.  Appears her doctor also started her on moment.  No fever.  Patient has no complaints of headache or chest pain or abdominal pain.  She uses 2 L nasal cannula oxygen support intermittently and is using it today.  Denies any vomiting or diarrhea.  Husband states that she appeared really weak earlier today he had a hard time getting out of bed and so decided bring her to the ER.      Home Medications Prior to Admission medications   Medication Sig Start Date End Date Taking? Authorizing Provider  albuterol (PROVENTIL HFA;VENTOLIN HFA) 108 (90 Base) MCG/ACT inhaler Inhale 2 puffs into the lungs every 6 (six) hours as needed for wheezing or shortness of breath. 10/23/18   Martinique, Betty G, MD  aspirin EC 81 MG tablet Take 81 mg by mouth daily.    [provider]  cholecalciferol (VITAMIN D) 1000 units tablet Take 2,000 Units by mouth daily.    [provider]  Fluticasone-Umeclidin-Vilant (TRELEGY ELLIPTA) 100-62.5-25 MCG/ACT AEPB Inhale 1 puff into the lungs daily. 07/30/21   Parrett, Fonnie Mu, NP  molnupiravir EUA (LAGEVRIO) 200 mg CAPS capsule Take 4 capsules (800 mg total) by mouth 2 (two) times daily for 5 days. 11/19/21 11/24/21  Isaac Bliss, Rayford Halsted, MD  sertraline (ZOLOFT) 25 MG tablet TAKE 1 TABLET BY MOUTH DAILY Patient taking differently: Take 25 mg by mouth daily. 05/12/21   Nafziger, Tommi Rumps, NP   sulfamethoxazole-trimethoprim (BACTRIM DS) 800-160 MG tablet Take 1 tablet by mouth 2 (two) times daily for 7 days. 11/19/21 11/26/21  Erline Hau, MD      Allergies    Contrast media [iodinated contrast media], Iohexol, Povidone-iodine, Penicillins, and Adhesive [tape]    Review of Systems   Review of Systems  Constitutional:  Positive for fever.  HENT:  Negative for ear pain.   Eyes:  Negative for pain.  Respiratory:  Positive for cough.   Cardiovascular:  Negative for chest pain.  Gastrointestinal:  Negative for abdominal pain.  Genitourinary:  Negative for flank pain.  Musculoskeletal:  Negative for back pain.  Skin:  Negative for rash.  Neurological:  Negative for headaches.   Physical Exam Updated Vital Signs BP 116/66    Pulse 90    Temp 98.8 F (37.1 C) (Oral)    Resp 15    LMP  (LMP Unknown)    SpO2 97%  Physical Exam Constitutional:      General: She is not in acute distress.    Appearance: Normal appearance.  HENT:     Head: Normocephalic.     Nose: Nose normal.  Eyes:     Extraocular Movements: Extraocular movements intact.  Cardiovascular:     Rate and Rhythm: Normal rate.  Pulmonary:     Effort: Pulmonary effort is normal.  Abdominal:  Tenderness: There is no abdominal tenderness. There is no guarding or rebound.  Musculoskeletal:        General: Normal range of motion.     Cervical back: Normal range of motion.  Neurological:     General: No focal deficit present.     Mental Status: She is alert. Mental status is at baseline.    ED Results / Procedures / Treatments   Labs (all labs ordered are listed, but only abnormal results are displayed) Labs Reviewed  RESP PANEL BY RT-PCR (FLU A&B, COVID) ARPGX2 - Abnormal; Notable for the following components:      Result Value   SARS Coronavirus 2 by RT PCR POSITIVE (*)    All other components within normal limits  CBC WITH DIFFERENTIAL/PLATELET - Abnormal; Notable for the following  components:   RBC 3.86 (*)    MCV 100.3 (*)    Monocytes Absolute 1.2 (*)    All other components within normal limits  COMPREHENSIVE METABOLIC PANEL - Abnormal; Notable for the following components:   Potassium 3.3 (*)    Total Protein 5.7 (*)    All other components within normal limits  URINALYSIS, ROUTINE W REFLEX MICROSCOPIC - Abnormal; Notable for the following components:   Protein, ur TRACE (*)    Leukocytes,Ua MODERATE (*)    All other components within normal limits  URINE CULTURE  MAGNESIUM  BRAIN NATRIURETIC PEPTIDE  TROPONIN I (HIGH SENSITIVITY)  TROPONIN I (HIGH SENSITIVITY)    EKG EKG Interpretation  Date/Time:  Friday November 20 2021 15:56:44 EST Ventricular Rate:  83 PR Interval:  138 QRS Duration: 92 QT Interval:  415 QTC Calculation: 488 R Axis:   268 Text Interpretation: Sinus rhythm Left anterior fascicular block Probable right ventricular hypertrophy Borderline prolonged QT interval Confirmed by Thamas Jaegers ([id number]) on 11/20/2021 4:12:32 PM  Radiology DG Chest 2 View  Result Date: 11/20/2021 CLINICAL DATA:  Shortness of breath.  COVID positive. EXAM: CHEST - 2 VIEW COMPARISON:  AP chest 09/30/2021; CT chest abdomen and pelvis 10/20/2020; chest two views 07/30/2021 FINDINGS: Cardiac silhouette and mediastinal contours are within normal limits. Calcification is seen within the aortic arch. There is flattening of the diaphragms and moderate hyperinflation. Lucencies within the upper lungs consistent with the centrilobular emphysematous changes seen on prior CT. There is mild blunting of the bilateral costophrenic angles which appears increased from 09/30/2021 and 07/30/2021 radiographs, likely tiny pleural effusions. No pneumothorax. Moderate multilevel degenerative disc changes of the midthoracic spine. IMPRESSION:: IMPRESSION: 1. Chronic hyperinflation and emphysematous changes. 2. New mild blunting of the costophrenic angles. Likely tiny pleural effusions. No  focal airspace opacity to indicate pneumonia. Electronically Signed   By: Yvonne Kendall M.D.   On: 11/20/2021 16:32    Procedures Procedures    Medications Ordered in ED Medications  albuterol (VENTOLIN HFA) 108 (90 Base) MCG/ACT inhaler 2 puff (2 puffs Inhalation Given 11/20/21 1651)  cefTRIAXone (ROCEPHIN) 1 g in sodium chloride 0.9 % 100 mL IVPB (has no administration in time range)  potassium chloride SA (KLOR-CON M) CR tablet 40 mEq (40 mEq Oral Given 11/20/21 1743)    ED Course/ Medical Decision Making/ A&P                           Medical Decision Making Amount and/or Complexity of Data Reviewed Labs: ordered. Radiology: ordered.  Risk Prescription drug management.   Review of records show office visit November 04, 2021 for her  primary care doctor for history of herpes.  Labs today relatively unremarkable potassium slightly low at 3.3, given oral replacement here in the ER.  COVID test is positive as per her history.  Patient given a breathing treatment here and oral potassium.  Otherwise on exam she is smiling appears comfortable in no acute distress.  She has 5/5 strength all extremities she is able to stand up.  Husband states he is not sure what happened earlier on while she was still globally weak but she appears much improved at this time with no focal neurodeficit.  I discussed the possibility of admission given her advanced age comorbidities and infection with COVID while having a urinary tract infection.  However patient's symptoms have improved she has no deficits she is able to ambulate and family feels comfortable taking her home.  Given a gram of Rocephin here, advised continued follow-up with her primary care doctor in 3 to 4 days, advised immediate return for worsening symptoms or any additional concerns or difficulty breathing to return back to the ER.   Final Clinical Impression(s) / ED Diagnoses Final diagnoses:  COVID-19 virus infection  Urinary tract  infection without hematuria, site unspecified    Rx / DC Orders ED Discharge Orders     None         Luna Fuse, MD 11/20/21 1945    Luna Fuse, MD 11/20/21 1946

## 2021-11-20 NOTE — Discharge Instructions (Addendum)
Continue antibiotics as written per your doctor.  Call your doctor in 3 to 4 days for visual evaluation.  Return back to the ER if your oxygenation number drops below 90% despite oxygen use, if you have worsening symptoms trouble breathing pain or any additional concerns return back to the ER.

## 2021-11-20 NOTE — Telephone Encounter (Signed)
This has been taking care

## 2021-11-20 NOTE — ED Notes (Signed)
Pt brought in by [facility] c/o generalized weakness. Pt tested + for covid today.  Pt normally on 2L O2 at home.  C/o DOE.  EMS vitals 97% on 2L O2 (88-92% on RA per EMS) Hr 80's Bp 112/68  EKG unremarkable.  AAOx4.  Pt currently taking bactrim for UTI.

## 2021-11-20 NOTE — Telephone Encounter (Signed)
Advised Brooke Washington of update and he articulated that pt should go to the hospital. Pt spouse notified of update and will discuss situation with daughter.

## 2021-11-21 LAB — URINE CULTURE
MICRO NUMBER:: 12987291
SPECIMEN QUALITY:: ADEQUATE

## 2021-11-22 DIAGNOSIS — Z87891 Personal history of nicotine dependence: Secondary | ICD-10-CM | POA: Diagnosis not present

## 2021-11-22 DIAGNOSIS — Z9181 History of falling: Secondary | ICD-10-CM | POA: Diagnosis not present

## 2021-11-22 DIAGNOSIS — J449 Chronic obstructive pulmonary disease, unspecified: Secondary | ICD-10-CM | POA: Diagnosis not present

## 2021-11-22 DIAGNOSIS — C8590 Non-Hodgkin lymphoma, unspecified, unspecified site: Secondary | ICD-10-CM | POA: Diagnosis not present

## 2021-11-22 DIAGNOSIS — Z7982 Long term (current) use of aspirin: Secondary | ICD-10-CM | POA: Diagnosis not present

## 2021-11-22 DIAGNOSIS — Z7951 Long term (current) use of inhaled steroids: Secondary | ICD-10-CM | POA: Diagnosis not present

## 2021-11-22 DIAGNOSIS — B0223 Postherpetic polyneuropathy: Secondary | ICD-10-CM | POA: Diagnosis not present

## 2021-11-22 DIAGNOSIS — Z9981 Dependence on supplemental oxygen: Secondary | ICD-10-CM | POA: Diagnosis not present

## 2021-11-22 DIAGNOSIS — Z8781 Personal history of (healed) traumatic fracture: Secondary | ICD-10-CM | POA: Diagnosis not present

## 2021-11-22 DIAGNOSIS — F32A Depression, unspecified: Secondary | ICD-10-CM | POA: Diagnosis not present

## 2021-11-23 LAB — URINE CULTURE: Culture: 20000 — AB

## 2021-11-24 ENCOUNTER — Telehealth: Payer: Self-pay | Admitting: *Deleted

## 2021-11-24 NOTE — Telephone Encounter (Signed)
Post ED Visit - Positive Culture Follow-up  Culture report reviewed by antimicrobial stewardship pharmacist: [place] Team []  Elenor Quinones, Pharm.D. []  Heide Guile, Pharm.D., BCPS AQ-ID []  Parks Neptune, Pharm.D., BCPS []  Alycia Rossetti, Pharm.D., BCPS []  [place], [place].D., BCPS, AAHIVP []  Legrand Como, Pharm.D., BCPS, AAHIVP []  Salome Arnt, PharmD, BCPS []  Johnnette Gourd, PharmD, BCPS []  Hughes Better, PharmD, BCPS []  Leeroy Cha, PharmD []  Laqueta Linden, PharmD, BCPS []  Albertina Parr, PharmD  [place] Team []  Leodis Sias, PharmD []  Lindell Spar, PharmD []  Royetta Asal, PharmD []  Graylin Shiver, Rph []  Rema Fendt) Glennon Mac, PharmD []  Arlyn Dunning, PharmD []  Netta Cedars, PharmD []  Dia Sitter, PharmD []  Leone Haven, PharmD []  Gretta Arab, PharmD []  Theodis Shove, PharmD []  Peggyann Juba, PharmD []  Reuel Boom, PharmD   Positive urine culture Reviewed by Lyn Henri, PA-C and no further patient follow-up is required at this time.  Harlon Flor [facility] 11/24/2021, 9:24 AM

## 2021-11-26 DIAGNOSIS — J449 Chronic obstructive pulmonary disease, unspecified: Secondary | ICD-10-CM | POA: Diagnosis not present

## 2021-11-26 DIAGNOSIS — Z9181 History of falling: Secondary | ICD-10-CM | POA: Diagnosis not present

## 2021-11-26 DIAGNOSIS — F32A Depression, unspecified: Secondary | ICD-10-CM | POA: Diagnosis not present

## 2021-11-26 DIAGNOSIS — B0223 Postherpetic polyneuropathy: Secondary | ICD-10-CM | POA: Diagnosis not present

## 2021-11-26 DIAGNOSIS — Z9981 Dependence on supplemental oxygen: Secondary | ICD-10-CM | POA: Diagnosis not present

## 2021-11-26 DIAGNOSIS — C8590 Non-Hodgkin lymphoma, unspecified, unspecified site: Secondary | ICD-10-CM | POA: Diagnosis not present

## 2021-11-27 DIAGNOSIS — C8590 Non-Hodgkin lymphoma, unspecified, unspecified site: Secondary | ICD-10-CM | POA: Diagnosis not present

## 2021-11-27 DIAGNOSIS — F32A Depression, unspecified: Secondary | ICD-10-CM | POA: Diagnosis not present

## 2021-11-27 DIAGNOSIS — Z9181 History of falling: Secondary | ICD-10-CM | POA: Diagnosis not present

## 2021-11-27 DIAGNOSIS — Z9981 Dependence on supplemental oxygen: Secondary | ICD-10-CM | POA: Diagnosis not present

## 2021-11-27 DIAGNOSIS — J449 Chronic obstructive pulmonary disease, unspecified: Secondary | ICD-10-CM | POA: Diagnosis not present

## 2021-11-27 DIAGNOSIS — B0223 Postherpetic polyneuropathy: Secondary | ICD-10-CM | POA: Diagnosis not present

## 2021-11-30 ENCOUNTER — Telehealth: Payer: Self-pay | Admitting: Adult Health

## 2021-11-30 DIAGNOSIS — Z9981 Dependence on supplemental oxygen: Secondary | ICD-10-CM | POA: Diagnosis not present

## 2021-11-30 DIAGNOSIS — J449 Chronic obstructive pulmonary disease, unspecified: Secondary | ICD-10-CM | POA: Diagnosis not present

## 2021-11-30 DIAGNOSIS — B0223 Postherpetic polyneuropathy: Secondary | ICD-10-CM | POA: Diagnosis not present

## 2021-11-30 DIAGNOSIS — F32A Depression, unspecified: Secondary | ICD-10-CM | POA: Diagnosis not present

## 2021-11-30 DIAGNOSIS — C8590 Non-Hodgkin lymphoma, unspecified, unspecified site: Secondary | ICD-10-CM | POA: Diagnosis not present

## 2021-11-30 DIAGNOSIS — Z9181 History of falling: Secondary | ICD-10-CM | POA: Diagnosis not present

## 2021-11-30 NOTE — Telephone Encounter (Signed)
Pt send dr Jerilee Hoh on 11-19-2021 and still testing positive for covid and would like to know if there an additional test she may be testing false positive. Please advise

## 2021-11-30 NOTE — Telephone Encounter (Signed)
Husband is aware per DPR.

## 2021-12-01 ENCOUNTER — Telehealth: Payer: Self-pay | Admitting: Adult Health

## 2021-12-01 NOTE — Telephone Encounter (Signed)
Brooke Washington from [place] call and stated she want the [facility] to know that when pt is walking her Heart rate get high it beat at 152.Brooke Washington # is [phone].

## 2021-12-03 ENCOUNTER — Telehealth: Payer: Self-pay | Admitting: Adult Health

## 2021-12-03 DIAGNOSIS — B0223 Postherpetic polyneuropathy: Secondary | ICD-10-CM | POA: Diagnosis not present

## 2021-12-03 DIAGNOSIS — C8590 Non-Hodgkin lymphoma, unspecified, unspecified site: Secondary | ICD-10-CM | POA: Diagnosis not present

## 2021-12-03 DIAGNOSIS — Z9981 Dependence on supplemental oxygen: Secondary | ICD-10-CM | POA: Diagnosis not present

## 2021-12-03 DIAGNOSIS — J449 Chronic obstructive pulmonary disease, unspecified: Secondary | ICD-10-CM | POA: Diagnosis not present

## 2021-12-03 DIAGNOSIS — F32A Depression, unspecified: Secondary | ICD-10-CM | POA: Diagnosis not present

## 2021-12-03 DIAGNOSIS — Z9181 History of falling: Secondary | ICD-10-CM | POA: Diagnosis not present

## 2021-12-03 NOTE — Telephone Encounter (Signed)
Verbal orders given to Kelly 

## 2021-12-03 NOTE — Telephone Encounter (Signed)
Okay for verbal orders? Please advise 

## 2021-12-03 NOTE — Telephone Encounter (Signed)
Left message for  Brooke Washington to return phone call. 

## 2021-12-03 NOTE — Telephone Encounter (Signed)
[name] PT with [facility] is calling and would like PT orders for 2x4

## 2021-12-04 DIAGNOSIS — Z9181 History of falling: Secondary | ICD-10-CM | POA: Diagnosis not present

## 2021-12-04 DIAGNOSIS — Z9981 Dependence on supplemental oxygen: Secondary | ICD-10-CM | POA: Diagnosis not present

## 2021-12-04 DIAGNOSIS — B0223 Postherpetic polyneuropathy: Secondary | ICD-10-CM | POA: Diagnosis not present

## 2021-12-04 DIAGNOSIS — J449 Chronic obstructive pulmonary disease, unspecified: Secondary | ICD-10-CM | POA: Diagnosis not present

## 2021-12-04 DIAGNOSIS — F32A Depression, unspecified: Secondary | ICD-10-CM | POA: Diagnosis not present

## 2021-12-04 DIAGNOSIS — C8590 Non-Hodgkin lymphoma, unspecified, unspecified site: Secondary | ICD-10-CM | POA: Diagnosis not present

## 2021-12-07 DIAGNOSIS — J449 Chronic obstructive pulmonary disease, unspecified: Secondary | ICD-10-CM | POA: Diagnosis not present

## 2021-12-07 DIAGNOSIS — Z9981 Dependence on supplemental oxygen: Secondary | ICD-10-CM | POA: Diagnosis not present

## 2021-12-07 DIAGNOSIS — B0223 Postherpetic polyneuropathy: Secondary | ICD-10-CM | POA: Diagnosis not present

## 2021-12-07 DIAGNOSIS — C8590 Non-Hodgkin lymphoma, unspecified, unspecified site: Secondary | ICD-10-CM | POA: Diagnosis not present

## 2021-12-07 DIAGNOSIS — Z9181 History of falling: Secondary | ICD-10-CM | POA: Diagnosis not present

## 2021-12-07 DIAGNOSIS — F32A Depression, unspecified: Secondary | ICD-10-CM | POA: Diagnosis not present

## 2021-12-10 DIAGNOSIS — B0223 Postherpetic polyneuropathy: Secondary | ICD-10-CM | POA: Diagnosis not present

## 2021-12-10 DIAGNOSIS — Z9981 Dependence on supplemental oxygen: Secondary | ICD-10-CM | POA: Diagnosis not present

## 2021-12-10 DIAGNOSIS — F32A Depression, unspecified: Secondary | ICD-10-CM | POA: Diagnosis not present

## 2021-12-10 DIAGNOSIS — J449 Chronic obstructive pulmonary disease, unspecified: Secondary | ICD-10-CM | POA: Diagnosis not present

## 2021-12-10 DIAGNOSIS — C8590 Non-Hodgkin lymphoma, unspecified, unspecified site: Secondary | ICD-10-CM | POA: Diagnosis not present

## 2021-12-10 DIAGNOSIS — Z9181 History of falling: Secondary | ICD-10-CM | POA: Diagnosis not present

## 2021-12-11 DIAGNOSIS — Z9181 History of falling: Secondary | ICD-10-CM | POA: Diagnosis not present

## 2021-12-11 DIAGNOSIS — J449 Chronic obstructive pulmonary disease, unspecified: Secondary | ICD-10-CM | POA: Diagnosis not present

## 2021-12-11 DIAGNOSIS — F32A Depression, unspecified: Secondary | ICD-10-CM | POA: Diagnosis not present

## 2021-12-11 DIAGNOSIS — Z9981 Dependence on supplemental oxygen: Secondary | ICD-10-CM | POA: Diagnosis not present

## 2021-12-11 DIAGNOSIS — B0223 Postherpetic polyneuropathy: Secondary | ICD-10-CM | POA: Diagnosis not present

## 2021-12-11 DIAGNOSIS — C8590 Non-Hodgkin lymphoma, unspecified, unspecified site: Secondary | ICD-10-CM | POA: Diagnosis not present

## 2021-12-14 DIAGNOSIS — Z9981 Dependence on supplemental oxygen: Secondary | ICD-10-CM | POA: Diagnosis not present

## 2021-12-14 DIAGNOSIS — F32A Depression, unspecified: Secondary | ICD-10-CM | POA: Diagnosis not present

## 2021-12-14 DIAGNOSIS — C8590 Non-Hodgkin lymphoma, unspecified, unspecified site: Secondary | ICD-10-CM | POA: Diagnosis not present

## 2021-12-14 DIAGNOSIS — Z9181 History of falling: Secondary | ICD-10-CM | POA: Diagnosis not present

## 2021-12-14 DIAGNOSIS — B0223 Postherpetic polyneuropathy: Secondary | ICD-10-CM | POA: Diagnosis not present

## 2021-12-14 DIAGNOSIS — J449 Chronic obstructive pulmonary disease, unspecified: Secondary | ICD-10-CM | POA: Diagnosis not present

## 2021-12-16 DIAGNOSIS — Z9981 Dependence on supplemental oxygen: Secondary | ICD-10-CM | POA: Diagnosis not present

## 2021-12-16 DIAGNOSIS — F32A Depression, unspecified: Secondary | ICD-10-CM | POA: Diagnosis not present

## 2021-12-16 DIAGNOSIS — J449 Chronic obstructive pulmonary disease, unspecified: Secondary | ICD-10-CM | POA: Diagnosis not present

## 2021-12-16 DIAGNOSIS — B0223 Postherpetic polyneuropathy: Secondary | ICD-10-CM | POA: Diagnosis not present

## 2021-12-16 DIAGNOSIS — Z9181 History of falling: Secondary | ICD-10-CM | POA: Diagnosis not present

## 2021-12-16 DIAGNOSIS — C8590 Non-Hodgkin lymphoma, unspecified, unspecified site: Secondary | ICD-10-CM | POA: Diagnosis not present

## 2021-12-17 DIAGNOSIS — B0223 Postherpetic polyneuropathy: Secondary | ICD-10-CM | POA: Diagnosis not present

## 2021-12-17 DIAGNOSIS — Z9981 Dependence on supplemental oxygen: Secondary | ICD-10-CM | POA: Diagnosis not present

## 2021-12-17 DIAGNOSIS — Z9181 History of falling: Secondary | ICD-10-CM | POA: Diagnosis not present

## 2021-12-17 DIAGNOSIS — J449 Chronic obstructive pulmonary disease, unspecified: Secondary | ICD-10-CM | POA: Diagnosis not present

## 2021-12-17 DIAGNOSIS — C8590 Non-Hodgkin lymphoma, unspecified, unspecified site: Secondary | ICD-10-CM | POA: Diagnosis not present

## 2021-12-17 DIAGNOSIS — F32A Depression, unspecified: Secondary | ICD-10-CM | POA: Diagnosis not present

## 2021-12-21 ENCOUNTER — Telehealth: Payer: Self-pay | Admitting: Adult Health

## 2021-12-21 DIAGNOSIS — J449 Chronic obstructive pulmonary disease, unspecified: Secondary | ICD-10-CM | POA: Diagnosis not present

## 2021-12-21 DIAGNOSIS — B0223 Postherpetic polyneuropathy: Secondary | ICD-10-CM | POA: Diagnosis not present

## 2021-12-21 DIAGNOSIS — C8590 Non-Hodgkin lymphoma, unspecified, unspecified site: Secondary | ICD-10-CM | POA: Diagnosis not present

## 2021-12-21 DIAGNOSIS — Z9181 History of falling: Secondary | ICD-10-CM | POA: Diagnosis not present

## 2021-12-21 DIAGNOSIS — F32A Depression, unspecified: Secondary | ICD-10-CM | POA: Diagnosis not present

## 2021-12-21 DIAGNOSIS — Z9981 Dependence on supplemental oxygen: Secondary | ICD-10-CM | POA: Diagnosis not present

## 2021-12-21 NOTE — Telephone Encounter (Signed)
Manuela Schwartz OT with [facility] is calling and needs verbal orders for OT 1x5 ?

## 2021-12-21 NOTE — Telephone Encounter (Signed)
Kelly with [facility] is calling in verbal orders for patient. ? ?The verbal orders are to extend home physical therapy for twice a week for 4 weeks. ? ?[name] could be contacted at [phone]. ? ?Please advise. ?

## 2021-12-22 DIAGNOSIS — F32A Depression, unspecified: Secondary | ICD-10-CM | POA: Diagnosis not present

## 2021-12-22 DIAGNOSIS — Z7982 Long term (current) use of aspirin: Secondary | ICD-10-CM | POA: Diagnosis not present

## 2021-12-22 DIAGNOSIS — Z9181 History of falling: Secondary | ICD-10-CM | POA: Diagnosis not present

## 2021-12-22 DIAGNOSIS — C8590 Non-Hodgkin lymphoma, unspecified, unspecified site: Secondary | ICD-10-CM | POA: Diagnosis not present

## 2021-12-22 DIAGNOSIS — Z7951 Long term (current) use of inhaled steroids: Secondary | ICD-10-CM | POA: Diagnosis not present

## 2021-12-22 DIAGNOSIS — J449 Chronic obstructive pulmonary disease, unspecified: Secondary | ICD-10-CM | POA: Diagnosis not present

## 2021-12-22 DIAGNOSIS — Z87891 Personal history of nicotine dependence: Secondary | ICD-10-CM | POA: Diagnosis not present

## 2021-12-22 DIAGNOSIS — B0223 Postherpetic polyneuropathy: Secondary | ICD-10-CM | POA: Diagnosis not present

## 2021-12-22 DIAGNOSIS — Z9981 Dependence on supplemental oxygen: Secondary | ICD-10-CM | POA: Diagnosis not present

## 2021-12-22 DIAGNOSIS — Z8781 Personal history of (healed) traumatic fracture: Secondary | ICD-10-CM | POA: Diagnosis not present

## 2021-12-22 NOTE — Telephone Encounter (Signed)
Verbal orders given to Kelly 

## 2021-12-22 NOTE — Telephone Encounter (Signed)
Left message to return phone call.

## 2021-12-23 DIAGNOSIS — B0223 Postherpetic polyneuropathy: Secondary | ICD-10-CM | POA: Diagnosis not present

## 2021-12-23 DIAGNOSIS — F32A Depression, unspecified: Secondary | ICD-10-CM | POA: Diagnosis not present

## 2021-12-23 DIAGNOSIS — J449 Chronic obstructive pulmonary disease, unspecified: Secondary | ICD-10-CM | POA: Diagnosis not present

## 2021-12-23 DIAGNOSIS — Z9981 Dependence on supplemental oxygen: Secondary | ICD-10-CM | POA: Diagnosis not present

## 2021-12-23 DIAGNOSIS — Z9181 History of falling: Secondary | ICD-10-CM | POA: Diagnosis not present

## 2021-12-23 DIAGNOSIS — C8590 Non-Hodgkin lymphoma, unspecified, unspecified site: Secondary | ICD-10-CM | POA: Diagnosis not present

## 2021-12-25 DIAGNOSIS — B0223 Postherpetic polyneuropathy: Secondary | ICD-10-CM | POA: Diagnosis not present

## 2021-12-25 DIAGNOSIS — F32A Depression, unspecified: Secondary | ICD-10-CM | POA: Diagnosis not present

## 2021-12-25 DIAGNOSIS — Z9181 History of falling: Secondary | ICD-10-CM | POA: Diagnosis not present

## 2021-12-25 DIAGNOSIS — Z9981 Dependence on supplemental oxygen: Secondary | ICD-10-CM | POA: Diagnosis not present

## 2021-12-25 DIAGNOSIS — J449 Chronic obstructive pulmonary disease, unspecified: Secondary | ICD-10-CM | POA: Diagnosis not present

## 2021-12-25 DIAGNOSIS — C8590 Non-Hodgkin lymphoma, unspecified, unspecified site: Secondary | ICD-10-CM | POA: Diagnosis not present

## 2021-12-28 DIAGNOSIS — Z9981 Dependence on supplemental oxygen: Secondary | ICD-10-CM | POA: Diagnosis not present

## 2021-12-28 DIAGNOSIS — C8590 Non-Hodgkin lymphoma, unspecified, unspecified site: Secondary | ICD-10-CM | POA: Diagnosis not present

## 2021-12-28 DIAGNOSIS — F32A Depression, unspecified: Secondary | ICD-10-CM | POA: Diagnosis not present

## 2021-12-28 DIAGNOSIS — Z9181 History of falling: Secondary | ICD-10-CM | POA: Diagnosis not present

## 2021-12-28 DIAGNOSIS — J449 Chronic obstructive pulmonary disease, unspecified: Secondary | ICD-10-CM | POA: Diagnosis not present

## 2021-12-28 DIAGNOSIS — B0223 Postherpetic polyneuropathy: Secondary | ICD-10-CM | POA: Diagnosis not present

## 2021-12-31 DIAGNOSIS — F32A Depression, unspecified: Secondary | ICD-10-CM | POA: Diagnosis not present

## 2021-12-31 DIAGNOSIS — B0223 Postherpetic polyneuropathy: Secondary | ICD-10-CM | POA: Diagnosis not present

## 2021-12-31 DIAGNOSIS — J449 Chronic obstructive pulmonary disease, unspecified: Secondary | ICD-10-CM | POA: Diagnosis not present

## 2021-12-31 DIAGNOSIS — Z9981 Dependence on supplemental oxygen: Secondary | ICD-10-CM | POA: Diagnosis not present

## 2021-12-31 DIAGNOSIS — C8590 Non-Hodgkin lymphoma, unspecified, unspecified site: Secondary | ICD-10-CM | POA: Diagnosis not present

## 2021-12-31 DIAGNOSIS — Z9181 History of falling: Secondary | ICD-10-CM | POA: Diagnosis not present

## 2022-01-05 DIAGNOSIS — B0223 Postherpetic polyneuropathy: Secondary | ICD-10-CM | POA: Diagnosis not present

## 2022-01-05 DIAGNOSIS — C8590 Non-Hodgkin lymphoma, unspecified, unspecified site: Secondary | ICD-10-CM | POA: Diagnosis not present

## 2022-01-05 DIAGNOSIS — F32A Depression, unspecified: Secondary | ICD-10-CM | POA: Diagnosis not present

## 2022-01-05 DIAGNOSIS — Z9981 Dependence on supplemental oxygen: Secondary | ICD-10-CM | POA: Diagnosis not present

## 2022-01-05 DIAGNOSIS — J449 Chronic obstructive pulmonary disease, unspecified: Secondary | ICD-10-CM | POA: Diagnosis not present

## 2022-01-05 DIAGNOSIS — Z9181 History of falling: Secondary | ICD-10-CM | POA: Diagnosis not present

## 2022-01-08 DIAGNOSIS — F32A Depression, unspecified: Secondary | ICD-10-CM | POA: Diagnosis not present

## 2022-01-08 DIAGNOSIS — C8590 Non-Hodgkin lymphoma, unspecified, unspecified site: Secondary | ICD-10-CM | POA: Diagnosis not present

## 2022-01-08 DIAGNOSIS — B0223 Postherpetic polyneuropathy: Secondary | ICD-10-CM | POA: Diagnosis not present

## 2022-01-08 DIAGNOSIS — Z9181 History of falling: Secondary | ICD-10-CM | POA: Diagnosis not present

## 2022-01-08 DIAGNOSIS — J449 Chronic obstructive pulmonary disease, unspecified: Secondary | ICD-10-CM | POA: Diagnosis not present

## 2022-01-08 DIAGNOSIS — Z9981 Dependence on supplemental oxygen: Secondary | ICD-10-CM | POA: Diagnosis not present

## 2022-01-11 DIAGNOSIS — J449 Chronic obstructive pulmonary disease, unspecified: Secondary | ICD-10-CM | POA: Diagnosis not present

## 2022-01-11 DIAGNOSIS — Z9981 Dependence on supplemental oxygen: Secondary | ICD-10-CM | POA: Diagnosis not present

## 2022-01-11 DIAGNOSIS — F32A Depression, unspecified: Secondary | ICD-10-CM | POA: Diagnosis not present

## 2022-01-11 DIAGNOSIS — C8590 Non-Hodgkin lymphoma, unspecified, unspecified site: Secondary | ICD-10-CM | POA: Diagnosis not present

## 2022-01-11 DIAGNOSIS — Z9181 History of falling: Secondary | ICD-10-CM | POA: Diagnosis not present

## 2022-01-11 DIAGNOSIS — B0223 Postherpetic polyneuropathy: Secondary | ICD-10-CM | POA: Diagnosis not present

## 2022-01-13 DIAGNOSIS — C8590 Non-Hodgkin lymphoma, unspecified, unspecified site: Secondary | ICD-10-CM | POA: Diagnosis not present

## 2022-01-13 DIAGNOSIS — Z9981 Dependence on supplemental oxygen: Secondary | ICD-10-CM | POA: Diagnosis not present

## 2022-01-13 DIAGNOSIS — J449 Chronic obstructive pulmonary disease, unspecified: Secondary | ICD-10-CM | POA: Diagnosis not present

## 2022-01-13 DIAGNOSIS — F32A Depression, unspecified: Secondary | ICD-10-CM | POA: Diagnosis not present

## 2022-01-13 DIAGNOSIS — B0223 Postherpetic polyneuropathy: Secondary | ICD-10-CM | POA: Diagnosis not present

## 2022-01-13 DIAGNOSIS — Z9181 History of falling: Secondary | ICD-10-CM | POA: Diagnosis not present

## 2022-01-14 DIAGNOSIS — J449 Chronic obstructive pulmonary disease, unspecified: Secondary | ICD-10-CM | POA: Diagnosis not present

## 2022-01-14 DIAGNOSIS — Z9181 History of falling: Secondary | ICD-10-CM | POA: Diagnosis not present

## 2022-01-14 DIAGNOSIS — Z9981 Dependence on supplemental oxygen: Secondary | ICD-10-CM | POA: Diagnosis not present

## 2022-01-14 DIAGNOSIS — C8590 Non-Hodgkin lymphoma, unspecified, unspecified site: Secondary | ICD-10-CM | POA: Diagnosis not present

## 2022-01-14 DIAGNOSIS — B0223 Postherpetic polyneuropathy: Secondary | ICD-10-CM | POA: Diagnosis not present

## 2022-01-14 DIAGNOSIS — F32A Depression, unspecified: Secondary | ICD-10-CM | POA: Diagnosis not present

## 2022-01-19 DIAGNOSIS — B0223 Postherpetic polyneuropathy: Secondary | ICD-10-CM | POA: Diagnosis not present

## 2022-01-19 DIAGNOSIS — F32A Depression, unspecified: Secondary | ICD-10-CM | POA: Diagnosis not present

## 2022-01-19 DIAGNOSIS — Z9181 History of falling: Secondary | ICD-10-CM | POA: Diagnosis not present

## 2022-01-19 DIAGNOSIS — Z9981 Dependence on supplemental oxygen: Secondary | ICD-10-CM | POA: Diagnosis not present

## 2022-01-19 DIAGNOSIS — C8590 Non-Hodgkin lymphoma, unspecified, unspecified site: Secondary | ICD-10-CM | POA: Diagnosis not present

## 2022-01-19 DIAGNOSIS — J449 Chronic obstructive pulmonary disease, unspecified: Secondary | ICD-10-CM | POA: Diagnosis not present

## 2022-01-20 DIAGNOSIS — F32A Depression, unspecified: Secondary | ICD-10-CM | POA: Diagnosis not present

## 2022-01-20 DIAGNOSIS — J449 Chronic obstructive pulmonary disease, unspecified: Secondary | ICD-10-CM | POA: Diagnosis not present

## 2022-01-20 DIAGNOSIS — C8590 Non-Hodgkin lymphoma, unspecified, unspecified site: Secondary | ICD-10-CM | POA: Diagnosis not present

## 2022-01-20 DIAGNOSIS — B0223 Postherpetic polyneuropathy: Secondary | ICD-10-CM | POA: Diagnosis not present

## 2022-01-20 DIAGNOSIS — Z9981 Dependence on supplemental oxygen: Secondary | ICD-10-CM | POA: Diagnosis not present

## 2022-01-20 DIAGNOSIS — Z9181 History of falling: Secondary | ICD-10-CM | POA: Diagnosis not present

## 2022-01-24 ENCOUNTER — Other Ambulatory Visit: Payer: Self-pay | Admitting: Adult Health

## 2022-01-24 DIAGNOSIS — F32A Depression, unspecified: Secondary | ICD-10-CM

## 2022-01-25 DIAGNOSIS — Z20822 Contact with and (suspected) exposure to covid-19: Secondary | ICD-10-CM | POA: Diagnosis not present

## 2022-01-26 DIAGNOSIS — Z20822 Contact with and (suspected) exposure to covid-19: Secondary | ICD-10-CM | POA: Diagnosis not present

## 2022-02-01 DIAGNOSIS — R051 Acute cough: Secondary | ICD-10-CM | POA: Diagnosis not present

## 2022-02-01 DIAGNOSIS — R059 Cough, unspecified: Secondary | ICD-10-CM | POA: Diagnosis not present

## 2022-02-01 DIAGNOSIS — Z20822 Contact with and (suspected) exposure to covid-19: Secondary | ICD-10-CM | POA: Diagnosis not present

## 2022-02-13 DIAGNOSIS — Z20822 Contact with and (suspected) exposure to covid-19: Secondary | ICD-10-CM | POA: Diagnosis not present

## 2022-02-15 DIAGNOSIS — Z20822 Contact with and (suspected) exposure to covid-19: Secondary | ICD-10-CM | POA: Diagnosis not present

## 2022-03-22 ENCOUNTER — Telehealth: Payer: Self-pay

## 2022-03-22 NOTE — Telephone Encounter (Signed)
Called and left a message asking them call the office back. Dr. Alvy Bimler has not seen her in a while. Does she need to schedule appt.

## 2022-03-23 ENCOUNTER — Telehealth: Payer: Self-pay

## 2022-03-23 NOTE — Telephone Encounter (Signed)
Patient returned call and notified that we would like to schedule her to be seen in the office. Patient aware and appreciative of call. Scheduling message sent and patient knows that someone from scheduling will call to get her scheduled for upcoming appointments.

## 2022-03-24 ENCOUNTER — Telehealth: Payer: Self-pay | Admitting: Hematology and Oncology

## 2022-03-24 ENCOUNTER — Other Ambulatory Visit: Payer: Self-pay | Admitting: Hematology and Oncology

## 2022-03-24 DIAGNOSIS — E559 Vitamin D deficiency, unspecified: Secondary | ICD-10-CM

## 2022-03-24 DIAGNOSIS — C8588 Other specified types of non-Hodgkin lymphoma, lymph nodes of multiple sites: Secondary | ICD-10-CM

## 2022-03-24 DIAGNOSIS — E538 Deficiency of other specified B group vitamins: Secondary | ICD-10-CM

## 2022-03-24 NOTE — Telephone Encounter (Signed)
.  Called patient to schedule appointment per 6/14 inbasket, patient is aware of date and time.

## 2022-04-05 ENCOUNTER — Other Ambulatory Visit: Payer: Self-pay | Admitting: Adult Health

## 2022-04-08 ENCOUNTER — Telehealth: Payer: Self-pay | Admitting: Adult Health

## 2022-04-08 ENCOUNTER — Inpatient Hospital Stay: Payer: Medicare Other | Attending: Hematology and Oncology | Admitting: Hematology and Oncology

## 2022-04-08 ENCOUNTER — Encounter: Payer: Self-pay | Admitting: Hematology and Oncology

## 2022-04-08 ENCOUNTER — Inpatient Hospital Stay: Payer: Medicare Other

## 2022-04-08 ENCOUNTER — Telehealth: Payer: Self-pay

## 2022-04-08 ENCOUNTER — Other Ambulatory Visit: Payer: Self-pay

## 2022-04-08 DIAGNOSIS — G620 Drug-induced polyneuropathy: Secondary | ICD-10-CM | POA: Diagnosis not present

## 2022-04-08 DIAGNOSIS — E538 Deficiency of other specified B group vitamins: Secondary | ICD-10-CM

## 2022-04-08 DIAGNOSIS — T451X5A Adverse effect of antineoplastic and immunosuppressive drugs, initial encounter: Secondary | ICD-10-CM | POA: Diagnosis not present

## 2022-04-08 DIAGNOSIS — Z79899 Other long term (current) drug therapy: Secondary | ICD-10-CM | POA: Diagnosis not present

## 2022-04-08 DIAGNOSIS — C8338 Diffuse large B-cell lymphoma, lymph nodes of multiple sites: Secondary | ICD-10-CM | POA: Diagnosis not present

## 2022-04-08 DIAGNOSIS — E559 Vitamin D deficiency, unspecified: Secondary | ICD-10-CM

## 2022-04-08 DIAGNOSIS — C8588 Other specified types of non-Hodgkin lymphoma, lymph nodes of multiple sites: Secondary | ICD-10-CM | POA: Diagnosis not present

## 2022-04-08 LAB — CMP (CANCER CENTER ONLY)
ALT: 11 U/L (ref 0–44)
AST: 19 U/L (ref 15–41)
Albumin: 4.4 g/dL (ref 3.5–5.0)
Alkaline Phosphatase: 125 U/L (ref 38–126)
Anion gap: 8 (ref 5–15)
BUN: 19 mg/dL (ref 8–23)
CO2: 30 mmol/L (ref 22–32)
Calcium: 9.9 mg/dL (ref 8.9–10.3)
Chloride: 104 mmol/L (ref 98–111)
Creatinine: 0.83 mg/dL (ref 0.44–1.00)
GFR, Estimated: >60 mL/min (ref >60)
Glucose, Bld: 100 mg/dL — ABNORMAL HIGH (ref 70–99)
Potassium: 3.7 mmol/L (ref 3.5–5.1)
Sodium: 142 mmol/L (ref 135–145)
Total Bilirubin: 0.5 mg/dL (ref 0.3–1.2)
Total Protein: 6.6 g/dL (ref 6.5–8.1)

## 2022-04-08 LAB — CBC WITH DIFFERENTIAL (CANCER CENTER ONLY)
Abs Immature Granulocytes: 0.03 10*3/uL (ref 0.00–0.07)
Basophils Absolute: 0 10*3/uL (ref 0.0–0.1)
Basophils Relative: 1 %
Eosinophils Absolute: 0.4 10*3/uL (ref 0.0–0.5)
Eosinophils Relative: 5 %
HCT: 41 % (ref 36.0–46.0)
Hemoglobin: 13.4 g/dL (ref 12.0–15.0)
Immature Granulocytes: 0 %
Lymphocytes Relative: 16 %
Lymphs Abs: 1.3 10*3/uL (ref 0.7–4.0)
MCH: 31.8 pg (ref 26.0–34.0)
MCHC: 32.7 g/dL (ref 30.0–36.0)
MCV: 97.2 fL (ref 80.0–100.0)
Monocytes Absolute: 0.7 10*3/uL (ref 0.1–1.0)
Monocytes Relative: 8 %
Neutro Abs: 5.7 10*3/uL (ref 1.7–7.7)
Neutrophils Relative %: 70 %
Platelet Count: 290 10*3/uL (ref 150–400)
RBC: 4.22 MIL/uL (ref 3.87–5.11)
RDW: 14.8 % (ref 11.5–15.5)
WBC Count: 8.1 10*3/uL (ref 4.0–10.5)
nRBC: 0 % (ref 0.0–0.2)

## 2022-04-08 LAB — VITAMIN D 25 HYDROXY (VIT D DEFICIENCY, FRACTURES): Vit D, 25-Hydroxy: 73.23 ng/mL (ref 30–100)

## 2022-04-08 LAB — VITAMIN B12: Vitamin B-12: 175 pg/mL — ABNORMAL LOW (ref 180–914)

## 2022-04-08 NOTE — Progress Notes (Signed)
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[facility]) Clinically, she have no signs of cancer recurrence She is 4 years out from her last treatment I will see her again next year with history and physical examination only If she have no signs of cancer recurrence by then, I will discharge her from the clinic  Vitamin B12 deficiency She is known to have vitamin B12 deficiency but has stopped receiving B12 injection since her recent illness Vitamin B12 level is pending If the results are low, the patient would like to get B12 injection through her primary care clinic We will call her primary care with results of her B12 if confirm she has persistent vitamin B12 deficiency  Peripheral neuropathy due to chemotherapy [facility]) She complain of peripheral neuropathy affecting her feet She is almost 4 years out from her prior treatment I suspect there could be component of B12 deficiency causing neuropathy She does not need medication for this  No orders of the defined types were placed in this encounter.   All questions were answered. The patient knows to call the clinic with any problems, questions or concerns. The total time spent in the appointment was 20 minutes encounter with patients including review of chart and various tests results, discussions about plan of care and coordination of care plan   [name], MD 04/08/2022 12:10 PM  INTERVAL HISTORY: Please see below for problem oriented charting. she returns for surveillance follow-up with her husband for history of lymphoma and vitamin B12 deficiency She has missed appointments in end of last year due to multiple illness She is better now No new lymphadenopathy or recent infection She has persistent neuropathy affecting her feet She has expressed desire to get B12 injection through her primary care  doctor's clinic  REVIEW OF SYSTEMS:   Constitutional: Denies fevers, chills or abnormal weight loss Eyes: Denies blurriness of vision Ears, nose, mouth, throat, and face: Denies mucositis or sore throat Respiratory: Denies cough, dyspnea or wheezes Cardiovascular: Denies palpitation, chest discomfort or lower extremity swelling Gastrointestinal:  Denies nausea, heartburn or change in bowel habits Skin: Denies abnormal skin rashes Lymphatics: Denies new lymphadenopathy or easy bruising Behavioral/Psych: Mood is stable, no new changes  All other systems were reviewed with the patient and are negative.  I have reviewed the past medical history, past surgical history, social history and family history with the patient and they are unchanged from previous note.  ALLERGIES:  is allergic to contrast media [iodinated contrast media], iohexol, povidone-iodine, penicillins, and adhesive [tape].  MEDICATIONS:  Current Outpatient Medications  Medication Sig Dispense Refill   albuterol (PROVENTIL HFA;VENTOLIN HFA) 108 (90 Base) MCG/ACT inhaler Inhale 2 puffs into the lungs every 6 (six) hours as needed for wheezing or shortness of breath. 1 Inhaler 0   aspirin EC 81 MG tablet Take 81 mg by mouth daily.     cholecalciferol (VITAMIN D) 1000 units tablet Take 2,000 Units by mouth daily.     Fluticasone-Umeclidin-Vilant (TRELEGY ELLIPTA) 100-62.5-25 MCG/ACT AEPB INHALE ONE PUFF TWICE DAILY 60 each 5   sertraline (ZOLOFT) 25 MG tablet TAKE 1 TABLET BY MOUTH DAILY 90 tablet 1   No current facility-administered medications for this visit.    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  Presented w bulky adenopathy, splenomegaly and bone marrow involvement c/w B-cell nonHodgkins lymphoma, low grade mixed  follicular and diffuse. She was treated with chlorambucil in 2000.   --2007 - 09/2008.  Treated with single agent Rituxan.   --12/2008.  She had progressive adenopathy in chest and abdomen and repeat biopsy showed low grade follicular and diffuse B cell NHL.   --01/2009 - 07/2009. She required left ureteral stent and was treated with Treanda/Rituxan x 5 cycles from 02-2009 thru 06-2009.    09/16/2014 Imaging   No lymphadenopathy in the chest, abdomen, or pelvis. The small right axillary lymph nodes seen on the previous exam are unchanged in the 18 month interval. Small to moderate hiatal hernia.   11/20/2017 Imaging   Mildly displaced and angulated pathologic fracture of the proximal humeral shaft. 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     11/22/2017 PET scan   New hypermetabolic lymphadenopathy within the chest and abdomen, consistent with recurrent lymphoma. New large hypermetabolic mass in the right hepatic lobe, consistent with lymphoma this involvement. New small hypermetabolic mass in the left kidney, which may be due to lymphomatous involvement although primary renal cell carcinoma cannot be excluded. Hypermetabolic mass in the right humerus with pathologic fracture, consistent with lymphomatous involvement. Small hypermetabolic foci in right posterior chest wall, also suspicious for recurrent lymphoma.   11/23/2017 Procedure   Bone, fragment(s), Right Humeral Shaft Reamings - ATYPICAL LYMPHOID INFILTRATE CONSISTENT WITH NON-HODGKIN'S B CELL LYMPHOMA. - SEE ONCOLOGY TABLE. Microscopic Comment LYMPHOMA Histologic type: Non-Hodgkins lymphoma, favor large cell type. Grade (if applicable): Favor high grade. Flow cytometry: No specimen is available for analysis. Immunohistochemical stains: BCL-2, BCL-6, CD3, CD5, CD10, CD15, CD20, CD30, CD34, CD43, LCA, CD79a, CD138, Ki-67, PAX-5 and TdT with appropriate controls as performed on block 1C. Touch  preps/imprints: Not performed. Comments: The sections show multiple, primarily soft tissue fragments displaying a dense lymphoid infiltrate characterized by a mixture of small round to slightly irregular lymphocytes, histiocytes, and large centroblastic appearing lymphoid cells with partially clumped to vesicular chromatin, small nucleoli and amphophilic to clear cytoplasm. The number of admixed large lymphoid cells is variable but they appear relatively abundant in some areas associated with clustering. No material is available for flow cytometric analysis. Hence, a large batter of immunohistochemical stains were performed and show a mixture of T and B cells but with a significant B cell component mostly composed of large lymphoid cells with positivity for CD79a, PAX-5, CD30, BCL-2, and BCL-6. LCA shows diffuse staining although some of the large lymphoid cells appear negative. No significant staining is seen with CD20, CD10, CD15, CD34, TdT, or CD138. The lack of CD20 expression in this material is possibly related to prior treatment. There is variable increased expression of Ki-67 ranging from 10 to >50% in some areas particularly where there is abundance of large lymphoid cells. There is admixed abundant T cell population in the background as seen with CD3, CD5, and CD43 with on apparent co-expression of CD5 in B cell areas. The overall findings are consistent with involvement by non-Hodgkin's B cell lymphoma and the presence of relative abundance of large lymphoid B-cells and increased Ki-67 expression favor a high grade large B cell lymphoma.   11/23/2017 Surgery   1. CPT 24516-Intramedullary nailing of right humerus fracture 2. CPT 20245-Bone biopsy, deep     11/23/2017 - 11/25/2017 Hospital Admission   She was admitted  to the hospital for management of humerus fracture and aggressive lymphoma   11/24/2017 Imaging   ECHO showed LV EF: 65% -   70%   11/25/2017 Procedure   Status post  ultrasound-guided biopsy of liver mass. Tissue specimen sent to pathology for complete histopathologic analysis   12/07/2017 Imaging   Successful placement of a right internal jugular approach power injectable Port-A-Cath. The catheter is ready for immediate use.   12/09/2017 - 03/29/2018 Chemotherapy   The patient had R-CHOP chemo     12/17/2017 - 12/20/2017 Hospital Admission   She was hospitalized for influenza infection   01/03/2018 Procedure   Unsuccessful initial L4-5 LP. Successful L2-3 lumbar puncture with installation of methotrexate.   01/24/2018 Procedure   Non complicated injection of intrathecal methotrexate, as above.   02/09/2018 Imaging   LV EF: 55% -   60%   02/10/2018 PET scan   1. Interval response to therapy. There has been resolution of previous hypermetabolic tumor within the chest and abdomen. Deauville criteria 2 and 3. 2. Diffuse radiotracer uptake is identified within the axial and appendicular skeleton which is favored to represent treatment related changes. 3. Significant decrease in FDG uptake associated with previously noted hypermetabolic tumor involving the proximal humeral diaphysis. The FDG uptake within this area on today's study is equivalent to that which is seen in the lumbar spine. 4. Aortic Atherosclerosis (ICD10-I70.0). Lad coronary artery calcifications. 5. Hiatal hernia   02/15/2018 Procedure   Fluoroscopic guided lumbar puncture and intrathecal injection of chemotherapy without complication.   03/31/2018 Procedure   Technically successful fluoroscopic guided lumbar puncture for intrathecal methotrexate injection. Procedure was well tolerated without evidence for immediate complications. Patient will recover in short stay prior to being discharged home   05/08/2018 PET scan   No residual/persistent enlarged or hypermetabolic lymph nodes in the neck, chest, abdomen or pelvis. Findings suggest an excellent response to treatment.   05/25/2018 -  Chemotherapy    The patient had Revlimid for maintenance chemo    06/07/2018 Imaging   LV EF: 55% -   60%   09/25/2018 PET scan   1. Stable exam. No hypermetabolic lymphadenopathy in the neck, chest, abdomen, or pelvis. 2.  Aortic Atherosclerois (ICD10-170.0) 3.  Emphysema. (ICD10-J43.9)     04/09/2019 Imaging   1. No CT findings without contrast to suggest recurrent lymphoma involving the chest, abdomen or pelvis. 2. Stable advanced atherosclerotic calcifications involving the thoracic and abdominal aorta and branch vessels. 3. Moderate to large hiatal hernia. 4. No significant bony findings.   10/08/2019 Imaging   1. No CT findings to suggest recurrent lymphoma. No lymphadenopathy in the lower neck, chest, abdomen, pelvis or inguinal regions. 2. Stable advanced atherosclerotic calcifications involving the thoracic and abdominal aorta and branch vessels. 3. Moderate to large hiatal hernia. 4. Stable sclerotic bone lesions involving the right ischium and right hip. New left total hip arthroplasty noted.     10/20/2020 Imaging   1. No acute process or evidence of active lymphoma within the chest, abdomen, or pelvis. 2. Degraded evaluation of the pelvis, secondary to beam hardening artifact from left hip arthroplasty. 3. Slight increased calcification within the hepatic dome, likely dystrophic at the site of a dominant mass back on the CT of 12/24/2017. Recommend attention on follow-up. 4. Aortic atherosclerosis (ICD10-I70.0), coronary artery atherosclerosis and emphysema (ICD10-J43.9).     PHYSICAL EXAMINATION: ECOG PERFORMANCE STATUS: 1 - Symptomatic but completely ambulatory  Vitals:   04/08/22 1143  BP: 140/66  Pulse:  92  Resp: 18  Temp: 98.4 F (36.9 C)  SpO2: 93%   Filed Weights   04/08/22 1143  Weight: 130 lb 6.4 oz (59.1 kg)    GENERAL:alert, no distress and comfortable SKIN: skin color, texture, turgor are normal, no rashes or significant lesions EYES: normal, Conjunctiva  are pink and non-injected, sclera clear OROPHARYNX:no exudate, no erythema and lips, buccal mucosa, and tongue normal  NECK: supple, thyroid normal size, non-tender, without nodularity LYMPH:  no palpable lymphadenopathy in the cervical, axillary or inguinal LUNGS: clear to auscultation and percussion with normal breathing effort HEART: regular rate & rhythm and no murmurs and no lower extremity edema ABDOMEN:abdomen soft, non-tender and normal bowel sounds Musculoskeletal:no cyanosis of digits and no clubbing  NEURO: alert & oriented x 3 with fluent speech, no focal motor/sensory deficits  LABORATORY DATA:  I have reviewed the data as listed    Component Value Date/Time   NA 142 04/08/2022 1124   NA 142 12/28/2016 1002   K 3.7 04/08/2022 1124   K 4.6 12/28/2016 1002   CL 104 04/08/2022 1124   CL 101 03/28/2013 0826   CO2 30 04/08/2022 1124   CO2 29 12/28/2016 1002   GLUCOSE 100 (H) 04/08/2022 1124   GLUCOSE 85 12/28/2016 1002   GLUCOSE 84 03/28/2013 0826   BUN 19 04/08/2022 1124   BUN 20.3 12/28/2016 1002   CREATININE 0.83 04/08/2022 1124   CREATININE 0.9 12/28/2016 1002   CALCIUM 9.9 04/08/2022 1124   CALCIUM 10.1 12/28/2016 1002   PROT 6.6 04/08/2022 1124   PROT 6.8 12/28/2016 1002   ALBUMIN 4.4 04/08/2022 1124   ALBUMIN 4.2 12/28/2016 1002   AST 19 04/08/2022 1124   AST 20 12/28/2016 1002   ALT 11 04/08/2022 1124   ALT 12 12/28/2016 1002   ALKPHOS 125 04/08/2022 1124   ALKPHOS 111 12/28/2016 1002   BILITOT 0.5 04/08/2022 1124   BILITOT 0.55 12/28/2016 1002   GFRNONAA >60 04/08/2022 1124   GFRAA >60 05/20/2020 0928   GFRAA >60 05/03/2019 1019    No results found for: "SPEP", "UPEP"  Lab Results  Component Value Date   WBC 8.1 04/08/2022   NEUTROABS 5.7 04/08/2022   HGB 13.4 04/08/2022   HCT 41.0 04/08/2022   MCV 97.2 04/08/2022   PLT 290 04/08/2022      Chemistry      Component Value Date/Time   NA 142 04/08/2022 1124   NA 142 12/28/2016 1002   K  3.7 04/08/2022 1124   K 4.6 12/28/2016 1002   CL 104 04/08/2022 1124   CL 101 03/28/2013 0826   CO2 30 04/08/2022 1124   CO2 29 12/28/2016 1002   BUN 19 04/08/2022 1124   BUN 20.3 12/28/2016 1002   CREATININE 0.83 04/08/2022 1124   CREATININE 0.9 12/28/2016 1002      Component Value Date/Time   CALCIUM 9.9 04/08/2022 1124   CALCIUM 10.1 12/28/2016 1002   ALKPHOS 125 04/08/2022 1124   ALKPHOS 111 12/28/2016 1002   AST 19 04/08/2022 1124   AST 20 12/28/2016 1002   ALT 11 04/08/2022 1124   ALT 12 12/28/2016 1002   BILITOT 0.5 04/08/2022 1124   BILITOT 0.55 12/28/2016 1002

## 2022-04-08 NOTE — Assessment & Plan Note (Signed)
Clinically, she have no signs of cancer recurrence She is 4 years out from her last treatment I will see her again next year with history and physical examination only If she have no signs of cancer recurrence by then, I will discharge her from the clinic

## 2022-04-08 NOTE — Telephone Encounter (Signed)
-----   Message from [name], MD sent at 04/08/2022  2:12 PM EDT ----- She wants b12 injection with PCP Her B12 is low Please call PCP office, we can fax result and ask PCP to arrange b12 injection monthly

## 2022-04-08 NOTE — Telephone Encounter (Addendum)
Nurse Hassan Rowan) with Dr. Calton Dach office called to say Pt had an appt today and her B12 is low. Recommends monthly B12 injections. Nurse stated after visit notes will be posted to Pt's chart via Epic.  Please advise.

## 2022-04-08 NOTE — Telephone Encounter (Signed)
Please advise 

## 2022-04-08 NOTE — Assessment & Plan Note (Signed)
She complain of peripheral neuropathy affecting her feet She is almost 4 years out from her prior treatment I suspect there could be component of B12 deficiency causing neuropathy She does not need medication for this

## 2022-04-08 NOTE — Telephone Encounter (Signed)
Called MD office and given below message. She is able to see lab results and will send note to the appropriate staff to arrange injections.

## 2022-04-08 NOTE — Assessment & Plan Note (Signed)
She is known to have vitamin B12 deficiency but has stopped receiving B12 injection since her recent illness Vitamin B12 level is pending If the results are low, the patient would like to get B12 injection through her primary care clinic We will call her primary care with results of her B12 if confirm she has persistent vitamin B12 deficiency

## 2022-04-09 NOTE — Telephone Encounter (Signed)
Tried to call pt to advise but no answer.

## 2022-04-14 NOTE — Telephone Encounter (Signed)
Pt spouse returned call and was given message below. PT spouse Mikki Santee stated that the doctor did not want pt taking OTC B12 bc he really want her levels to boost faster. Pt advised that Tommi Rumps stated the injections were still an option. Pt has been set up for B12 injection.    Please advise on the frequency

## 2022-04-14 NOTE — Telephone Encounter (Signed)
LM again for pt to return call regarding message below.

## 2022-04-16 NOTE — Telephone Encounter (Signed)
Noted  

## 2022-04-16 NOTE — Telephone Encounter (Signed)
Pt spouse Mikki Santee notified of update.

## 2022-04-21 ENCOUNTER — Ambulatory Visit (INDEPENDENT_AMBULATORY_CARE_PROVIDER_SITE_OTHER): Payer: Medicare Other | Admitting: *Deleted

## 2022-04-21 DIAGNOSIS — E538 Deficiency of other specified B group vitamins: Secondary | ICD-10-CM | POA: Diagnosis not present

## 2022-04-21 MED ORDER — CYANOCOBALAMIN 1000 MCG/ML IJ SOLN
1000.0000 ug | Freq: Once | INTRAMUSCULAR | Status: AC
  Administered 2022-04-21: 1000 ug via INTRAMUSCULAR

## 2022-04-21 NOTE — Progress Notes (Signed)
Pt here for monthly B12 injection per   B12 1000mcg given IM, and pt tolerated injection well.  Next B12 injection scheduled for x 2 weeks  

## 2022-05-06 ENCOUNTER — Ambulatory Visit (INDEPENDENT_AMBULATORY_CARE_PROVIDER_SITE_OTHER): Payer: Medicare Other

## 2022-05-06 ENCOUNTER — Telehealth: Payer: Self-pay | Admitting: Adult Health

## 2022-05-06 DIAGNOSIS — E538 Deficiency of other specified B group vitamins: Secondary | ICD-10-CM

## 2022-05-06 MED ORDER — CYANOCOBALAMIN 1000 MCG/ML IJ SOLN
1000.0000 ug | Freq: Once | INTRAMUSCULAR | Status: AC
  Administered 2022-05-06: 1000 ug via INTRAMUSCULAR

## 2022-05-06 NOTE — Telephone Encounter (Signed)
Left message for patient to call back  

## 2022-05-06 NOTE — Progress Notes (Signed)
Per orders of Dr. Hernandez, injection of Cyanocobalamin 1000 mcg given by Jacquelyne Quarry L Val Schiavo. Patient tolerated injection well.  

## 2022-05-06 NOTE — Telephone Encounter (Signed)
Spoke with pt who states hot face, swelling eyes, urinary problems and constipation have all stated since using Trelegy. Pt would like to restart Breo and stop the Trelegy. Pt does state having enough Breo for next couple of days. Tammy please advise.

## 2022-05-06 NOTE — Telephone Encounter (Signed)
Patient is returning phone call. Patient phone number is [phone].

## 2022-05-07 MED ORDER — FLUTICASONE FUROATE-VILANTEROL 200-25 MCG/ACT IN AEPB: 1.0000 | INHALATION_SPRAY | Freq: Every day | RESPIRATORY_TRACT | 1 refills | Status: DC

## 2022-05-07 NOTE — Telephone Encounter (Signed)
Stop Trelegy, please send Breo 200 - 1 puff daily.

## 2022-05-07 NOTE — Telephone Encounter (Signed)
RX has been sent. Called patient but she did not answer. Left detailed VM for her.

## 2022-05-07 NOTE — Telephone Encounter (Signed)
Dr. Silas Flood, can you please advise since TP is not available today?

## 2022-05-11 ENCOUNTER — Telehealth: Payer: Self-pay | Admitting: Adult Health

## 2022-05-11 MED ORDER — FLUTICASONE FUROATE-VILANTEROL 200-25 MCG/ACT IN AEPB: 1.0000 | INHALATION_SPRAY | Freq: Every day | RESPIRATORY_TRACT | 11 refills | Status: DC

## 2022-05-11 NOTE — Telephone Encounter (Signed)
Called patient and went over medication that she wanted refilled. Verified pharmacy with patient over the phone. Nothing further needed

## 2022-05-14 NOTE — Telephone Encounter (Signed)
Error. Please disregard

## 2022-05-25 DIAGNOSIS — B0229 Other postherpetic nervous system involvement: Secondary | ICD-10-CM | POA: Diagnosis not present

## 2022-05-25 DIAGNOSIS — L309 Dermatitis, unspecified: Secondary | ICD-10-CM | POA: Diagnosis not present

## 2022-05-25 DIAGNOSIS — B029 Zoster without complications: Secondary | ICD-10-CM | POA: Diagnosis not present

## 2022-05-26 NOTE — Telephone Encounter (Signed)
Closing encounter

## 2022-06-07 ENCOUNTER — Ambulatory Visit (INDEPENDENT_AMBULATORY_CARE_PROVIDER_SITE_OTHER): Payer: Medicare Other

## 2022-06-07 DIAGNOSIS — E538 Deficiency of other specified B group vitamins: Secondary | ICD-10-CM

## 2022-06-07 MED ORDER — CYANOCOBALAMIN 1000 MCG/ML IJ SOLN
1000.0000 ug | Freq: Once | INTRAMUSCULAR | Status: AC
  Administered 2022-06-07: 1000 ug via INTRAMUSCULAR

## 2022-06-07 NOTE — Progress Notes (Signed)
Pt here for monthly B12 injection per Dr. Jerilee Hoh  B12 1024mg given IM, and pt tolerated injection well.

## 2022-06-22 ENCOUNTER — Telehealth: Payer: Self-pay | Admitting: Adult Health

## 2022-06-22 NOTE — Telephone Encounter (Signed)
Spoke with patient to schedule AWV  Patient declined stating she is not interested and hung up

## 2022-07-08 ENCOUNTER — Ambulatory Visit (INDEPENDENT_AMBULATORY_CARE_PROVIDER_SITE_OTHER): Payer: Medicare Other

## 2022-07-08 DIAGNOSIS — Z23 Encounter for immunization: Secondary | ICD-10-CM

## 2022-07-08 DIAGNOSIS — E538 Deficiency of other specified B group vitamins: Secondary | ICD-10-CM | POA: Diagnosis not present

## 2022-07-08 MED ORDER — CYANOCOBALAMIN 1000 MCG/ML IJ SOLN
1000.0000 ug | Freq: Once | INTRAMUSCULAR | Status: AC
  Administered 2022-07-08: 1000 ug via INTRAMUSCULAR

## 2022-07-08 NOTE — Progress Notes (Signed)
Pt here for monthly B12 injection per [name], NP.  B12 1068mg given IM, and pt tolerated injection well.  Next B12 injection scheduled for next month.

## 2022-08-03 ENCOUNTER — Ambulatory Visit (INDEPENDENT_AMBULATORY_CARE_PROVIDER_SITE_OTHER): Payer: Medicare Other | Admitting: Adult Health

## 2022-08-03 ENCOUNTER — Encounter: Payer: Self-pay | Admitting: Adult Health

## 2022-08-03 VITALS — BP 102/70 | HR 90 | Temp 97.6°F | Ht 63.0 in | Wt 126.0 lb

## 2022-08-03 DIAGNOSIS — Z8619 Personal history of other infectious and parasitic diseases: Secondary | ICD-10-CM | POA: Diagnosis not present

## 2022-08-03 DIAGNOSIS — J449 Chronic obstructive pulmonary disease, unspecified: Secondary | ICD-10-CM

## 2022-08-03 DIAGNOSIS — E538 Deficiency of other specified B group vitamins: Secondary | ICD-10-CM

## 2022-08-03 MED ORDER — CYANOCOBALAMIN 1000 MCG/ML IJ SOLN
1000.0000 ug | Freq: Once | INTRAMUSCULAR | Status: AC
  Administered 2022-08-03: 1000 ug via INTRAMUSCULAR

## 2022-08-03 MED ORDER — ALBUTEROL SULFATE HFA 108 (90 BASE) MCG/ACT IN AERS: 2.0000 | INHALATION_SPRAY | Freq: Four times a day (QID) | RESPIRATORY_TRACT | 3 refills | Status: AC | PRN

## 2022-08-03 NOTE — Patient Instructions (Signed)
It was great seeing you today   Please follow up for physical

## 2022-08-03 NOTE — Progress Notes (Signed)
Subjective:    [name], female    DOB: 04-16-1941, 81 y.o.   MRN: [id number]  HPI 81 year old female who  has a past medical history of Cancer ([place]) ( APRIL OF 2000), COPD (chronic obstructive pulmonary disease) ([place]), Depression, Emphysema of lung ([place]), GERD (gastroesophageal reflux disease), Hyperlipidemia, and S/p left hip fracture (04/15/2019).  She presents to the office today due to history of COPD GOLD II. She is currently managed with Breo ellipta 200-25 mg daily and reports that this works well for her. She continues to get SOB from time time and will use her albuterol inhaler when needed. She needs a refill of the albuterol inhaler.   She also needs a B12 injection due to history of b12 deficiency   She is also wondering if she can have her shingles vaccination despite having shingles about 10 months ago    Review of Systems See HPI   Past Medical History:  Diagnosis Date   Cancer ([facility])  APRIL OF 2000   LOW-GRADE NON-HODGKIN'S LYMPHOMA   COPD (chronic obstructive pulmonary disease) ([place])    Depression    Emphysema of lung ([facility])    GERD (gastroesophageal reflux disease)    Hyperlipidemia    S/p left hip fracture 04/15/2019    Social History   Socioeconomic History   Marital status: Married    Spouse name: Not on file   Number of children: Not on file   Years of education: Not on file   Highest education level: Bachelor's degree (e.g., BA, AB, BS)  Occupational History   Not on file  Tobacco Use   Smoking status: Former    Packs/day: 0.25    Years: 51.00    Total pack years: 12.75    Types: Cigarettes    Quit date: 03/12/2015    Years since quitting: 7.4   Smokeless tobacco: Never  Vaping Use   Vaping Use: Some days  Substance and Sexual Activity   Alcohol use: Yes    Alcohol/week: 0.0 standard drinks of alcohol    Comment: 1 glass of wine monthly   Drug use: No   Sexual activity: Not Currently    Birth control/protection:  Post-menopausal  Other Topics Concern   Not on file  Social History Narrative   Married 50 years    One daughter who lives in [place], [place]   - Photographer for mens and womens clothing   - 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       Pets: Dog      Diet: Healthy diet, fruits, veggies and fish.    Exercise: None       Social Determinants of Health   Financial Resource Strain: Low Risk  (10/26/2021)   Overall Financial Resource Strain (CARDIA)    Difficulty of Paying Living Expenses: Not hard at all  Food Insecurity: No Food Insecurity (10/26/2021)   Hunger Vital Sign    Worried About Running Out of Food in the Last Year: Never true    Ran Out of Food in the Last Year: Never true  Transportation Needs: No Transportation Needs (10/26/2021)   PRAPARE - Hydrologist (Medical): No    Lack of Transportation (Non-Medical): No  Physical Activity: Unknown (10/26/2021)   Exercise Vital Sign    Days of Exercise per Week: 0 days  Minutes of Exercise per Session: Not on file  Recent Concern: Physical Activity - Inactive (10/26/2021)   Exercise Vital Sign    Days of Exercise per Week: 0 days    Minutes of Exercise per Session: 0 min  Stress: No Stress Concern Present (10/26/2021)   [place]    Feeling of Stress : Not at all  Social Connections: Moderately Isolated (10/26/2021)   Social Connection and Isolation Panel [NHANES]    Frequency of Communication with Friends and Family: More than three times a week    Frequency of Social Gatherings with Friends and Family: More than three times a week    Attends Religious Services: Never    Marine scientist or Organizations: No    Attends Music therapist: Not on file    Marital Status: Married  Human resources officer Violence: Not on file    Past Surgical History:   Procedure Laterality Date   CYSTECTOMY W/ URETEROILEAL CONDUIT  1997   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   FRACTURE SURGERY     IR FLUORO GUIDE PORT INSERTION RIGHT  12/07/2017   IR REMOVAL TUN ACCESS W/ PORT W/O FL MOD SED  11/04/2020   IR US GUIDE VASC ACCESS RIGHT  12/07/2017   ORIF HUMERUS FRACTURE Right 11/23/2017   Procedure: OPEN REDUCTION INTERNAL FIXATION (ORIF) HUMERAL SHAFT FRACTURE;  Surgeon: Shona Needles, MD;  Location: [facility];  Service: Orthopedics;  Laterality: Right;   RIGHT OOPHORECTOMY  1976   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   Procedure: TOTAL HIP ARTHROPLASTY ANTERIOR APPROACH LEFT HIP;  Surgeon: Rod Can, MD;  Location: [facility];  Service: Orthopedics;  Laterality: Left;    Family History  Problem Relation Age of Onset   Cancer Mother        colon ca    Allergies  Allergen Reactions   Contrast Media [Iodinated Contrast Media] Shortness Of Breath, Rash and Other (See Comments)    Throat swells   Iohexol Shortness Of Breath   Povidone-Iodine Hives, Shortness Of Breath and Rash    REACTION: throat swells   Penicillins Hives, Itching and Rash    Has patient had a PCN reaction causing immediate rash, facial/tongue/throat swelling, SOB or lightheadedness with hypotension: Unknown Has patient had a PCN reaction causing severe rash involving mucus membranes or skin necrosis: Yes Has patient had a PCN reaction that required hospitalization: Unknown Has patient had a PCN reaction occurring within the last 10 years: Unknown If all of the above answers are "NO", then may proceed with Cephalosporin use.    Adhesive [Tape] Hives   Valacyclovir Other (See Comments)    Current Outpatient Medications on File Prior to Visit  Medication Sig Dispense Refill   aspirin EC 81 MG tablet Take 81 mg by mouth daily.     cholecalciferol (VITAMIN D) 1000 units tablet Take 2,000 Units by mouth daily.     fluticasone furoate-vilanterol (BREO  ELLIPTA) 200-25 MCG/ACT AEPB Inhale 1 puff into the lungs daily. 60 each 11   No current facility-administered medications on file prior to visit.    BP 102/70   Pulse 90   Temp 97.6 F (36.4 C) (Oral)   Ht '5\' 3"'$  (1.6 m)   Wt 126 lb (57.2 kg)   LMP  (LMP Unknown)   SpO2 95%   BMI 22.32 kg/m       Objective:   Physical Exam Vitals and  nursing note reviewed.  Constitutional:      Appearance: Normal appearance.  Cardiovascular:     Rate and Rhythm: Normal rate and regular rhythm.     Pulses: Normal pulses.     Heart sounds: Normal heart sounds.  Pulmonary:     Effort: Pulmonary effort is normal.     Breath sounds: Normal breath sounds.  Skin:    General: Skin is warm and dry.     Capillary Refill: Capillary refill takes less than 2 seconds.  Neurological:     General: No focal deficit present.     Mental Status: She is alert and oriented to person, place, and time.  Psychiatric:        Mood and Affect: Mood normal.        Behavior: Behavior normal.        Thought Content: Thought content normal.        Judgment: Judgment normal.       Assessment & Plan:   1. COPD GOLD II  - albuterol (VENTOLIN HFA) 108 (90 Base) MCG/ACT inhaler; Inhale 2 puffs into the lungs every 6 (six) hours as needed for wheezing or shortness of breath.  Dispense: 1 each; Refill: 3  2. B12 deficiency  - cyanocobalamin (VITAMIN B12) injection 1,000 mcg  3. History of shingles - Advised that she can get the shingles vaccination and it would be recommended that she do so   [name], NP

## 2022-08-05 ENCOUNTER — Ambulatory Visit: Payer: Medicare Other

## 2022-08-24 ENCOUNTER — Telehealth: Payer: Self-pay | Admitting: *Deleted

## 2022-08-24 ENCOUNTER — Encounter: Payer: Self-pay | Admitting: *Deleted

## 2022-08-24 NOTE — Patient Outreach (Signed)
  Care Coordination   Initial Visit Note   08/24/2022 Name: Brooke Washington MRN: [id number] DOB: [birth year]  Brooke Washington is a 81 y.o. year old female who sees Nafziger, Tommi Rumps, NP for primary care. I spoke with  Gilford Silvius by phone today.  What matters to the patients health and wellness today?  No needs    Goals Addressed               This Visit's Progress     COMPLETED: No needs (pt-stated)        Care Coordination Interventions: Reviewed medications with patient and discussed adherence with no needed refills Assessed social determinant of health barriers Introduced care management services with available RN case manager, social work and pharmacy.         SDOH assessments and interventions completed:  Yes  SDOH Interventions Today    Flowsheet Row Most Recent Value  SDOH Interventions   Food Insecurity Interventions Intervention Not Indicated  Housing Interventions Intervention Not Indicated  Transportation Interventions Intervention Not Indicated  Utilities Interventions Intervention Not Indicated       Care Coordination Interventions Activated:  Yes  Care Coordination Interventions:  Yes, provided   Follow up plan: No further intervention required.   Encounter Outcome:  Pt. Visit Completed   Raina Mina, RN Care Management Coordinator [facility] Office [phone]

## 2022-08-24 NOTE — Patient Instructions (Signed)
Visit Information  Thank you for taking time to visit with me today. Please don't hesitate to contact me if I can be of assistance to you.   Following are the goals we discussed today:   Goals Addressed               This Visit's Progress     COMPLETED: No needs (pt-stated)        Care Coordination Interventions: Reviewed medications with patient and discussed adherence with no needed refills Assessed social determinant of health barriers Introduced care management services with available RN case manager, social work and pharmacy.         Please call the care guide team at [phone] if you need to cancel or reschedule your appointment.   If you are experiencing a Mental Health or [place] or need someone to talk to, please call the Suicide and Crisis Lifeline: 988  Patient verbalizes understanding of instructions and care plan provided today and agrees to view in [facility]. Active MyChart status and patient understanding of how to access instructions and care plan via MyChart confirmed with patient.     No further follow up required: No needs  Raina Mina, RN Care Management Coordinator [facility] Office [phone]

## 2022-09-07 ENCOUNTER — Ambulatory Visit: Payer: Medicare Other

## 2022-09-08 ENCOUNTER — Ambulatory Visit (INDEPENDENT_AMBULATORY_CARE_PROVIDER_SITE_OTHER): Payer: Medicare Other

## 2022-09-08 DIAGNOSIS — E538 Deficiency of other specified B group vitamins: Secondary | ICD-10-CM

## 2022-09-08 MED ORDER — CYANOCOBALAMIN 1000 MCG/ML IJ SOLN
1000.0000 ug | INTRAMUSCULAR | Status: AC
  Administered 2022-09-08 – 2022-12-13 (×3): 1000 ug via INTRAMUSCULAR

## 2022-09-08 NOTE — Progress Notes (Signed)
Pt here for monthly B12 injection per Tommi Rumps, NP  B12 109mg given IM right deltoid and pt tolerated injection well.  Next B12 injection scheduled for 10/06/22

## 2022-10-06 ENCOUNTER — Ambulatory Visit (INDEPENDENT_AMBULATORY_CARE_PROVIDER_SITE_OTHER): Payer: Medicare Other

## 2022-10-06 DIAGNOSIS — E538 Deficiency of other specified B group vitamins: Secondary | ICD-10-CM

## 2022-10-06 NOTE — Progress Notes (Signed)
Per orders of Nafziger, Tommi Rumps, NP, injection of B12 given in right deltoid by Franco Collet. Patient tolerated injection well.  Lab Results  Component Value Date   VITAMINB12 175 (L) 04/08/2022

## 2022-10-06 NOTE — Patient Instructions (Signed)
Health Maintenance Due  Topic Date Due   Zoster Vaccines- Shingrix (1 of 2) Never done   DEXA SCAN  Never done   DTaP/Tdap/Td (2 - Tdap) 10/11/2006   COVID-19 Vaccine (4 - 2023-24 season) 06/11/2022   Medicare Annual Wellness (AWV)  06/15/2022      Row Labels 08/03/2022   11:31 AM 06/15/2021   11:51 AM 08/19/2020    8:33 AM  Depression screen PHQ 2/9   Section Header. No data exists in this row.     Decreased Interest   0 0 0  Down, Depressed, Hopeless   0 0 0  PHQ - 2 Score   0 0 0

## 2022-10-12 MED ORDER — CYANOCOBALAMIN 1000 MCG/ML IJ SOLN
1000.0000 ug | Freq: Once | INTRAMUSCULAR | Status: AC
  Administered 2023-08-18: 1000 ug via INTRAMUSCULAR

## 2022-10-12 NOTE — Addendum Note (Signed)
Addended by: Gwenyth Ober R on: 10/12/2022 09:10 AM   Modules accepted: Orders

## 2022-11-08 ENCOUNTER — Ambulatory Visit (INDEPENDENT_AMBULATORY_CARE_PROVIDER_SITE_OTHER): Payer: Medicare Other | Admitting: *Deleted

## 2022-11-08 DIAGNOSIS — E538 Deficiency of other specified B group vitamins: Secondary | ICD-10-CM | POA: Diagnosis not present

## 2022-11-08 MED ORDER — CYANOCOBALAMIN 1000 MCG/ML IJ SOLN
1000.0000 ug | Freq: Once | INTRAMUSCULAR | Status: AC
  Administered 2022-11-08: 1000 ug via INTRAMUSCULAR

## 2022-11-08 NOTE — Progress Notes (Signed)
Per orders of Dr. Fry, injection of Cyanocobalamin 1000mcg given by Trevino Wyatt A. Patient tolerated injection well.  

## 2022-12-13 ENCOUNTER — Ambulatory Visit (INDEPENDENT_AMBULATORY_CARE_PROVIDER_SITE_OTHER): Payer: Medicare Other

## 2022-12-13 DIAGNOSIS — E538 Deficiency of other specified B group vitamins: Secondary | ICD-10-CM | POA: Diagnosis not present

## 2022-12-13 NOTE — Progress Notes (Signed)
Per orders of Dr. Sarajane Jews, injection of Cyanocobalamin Injec. 1000 mcg given by Encarnacion Slates on R deltoid.  Patient tolerated injection well.

## 2023-01-10 ENCOUNTER — Ambulatory Visit (INDEPENDENT_AMBULATORY_CARE_PROVIDER_SITE_OTHER): Payer: Medicare Other

## 2023-01-10 DIAGNOSIS — E538 Deficiency of other specified B group vitamins: Secondary | ICD-10-CM

## 2023-01-10 MED ORDER — CYANOCOBALAMIN 1000 MCG/ML IJ SOLN
1000.0000 ug | Freq: Once | INTRAMUSCULAR | Status: AC
  Administered 2023-01-10: 1000 ug via INTRAMUSCULAR

## 2023-01-10 NOTE — Progress Notes (Signed)
Per orders of Dr. Fry, injection of Cyanocobalamin 1000 mcg given by Quentin Strebel L Cobe Viney. °Patient tolerated injection well.  °

## 2023-02-14 ENCOUNTER — Ambulatory Visit (INDEPENDENT_AMBULATORY_CARE_PROVIDER_SITE_OTHER): Payer: Medicare Other

## 2023-02-14 DIAGNOSIS — E538 Deficiency of other specified B group vitamins: Secondary | ICD-10-CM | POA: Diagnosis not present

## 2023-02-14 MED ORDER — CYANOCOBALAMIN 1000 MCG/ML IJ SOLN
1000.0000 ug | Freq: Once | INTRAMUSCULAR | Status: AC
  Administered 2023-02-14: 1000 ug via INTRAMUSCULAR

## 2023-02-14 NOTE — Progress Notes (Signed)
Per orders of Dr. Hernandez, injection of Cyanocobalamin 1000 mcg given by Copeland Lapier L Lashann Hagg. Patient tolerated injection well.  

## 2023-03-14 ENCOUNTER — Ambulatory Visit (INDEPENDENT_AMBULATORY_CARE_PROVIDER_SITE_OTHER): Payer: Medicare Other | Admitting: *Deleted

## 2023-03-14 DIAGNOSIS — E538 Deficiency of other specified B group vitamins: Secondary | ICD-10-CM | POA: Diagnosis not present

## 2023-03-14 MED ORDER — CYANOCOBALAMIN 1000 MCG/ML IJ SOLN
1000.0000 ug | Freq: Once | INTRAMUSCULAR | Status: AC
  Administered 2023-03-14: 1000 ug via INTRAMUSCULAR

## 2023-03-14 NOTE — Progress Notes (Signed)
Per orders of Dr. Fry, injection of Cyanocobalamin 1000mcg given by Cieanna Stormes A. Patient tolerated injection well.  

## 2023-03-22 ENCOUNTER — Encounter: Payer: Self-pay | Admitting: Adult Health

## 2023-03-22 ENCOUNTER — Ambulatory Visit (INDEPENDENT_AMBULATORY_CARE_PROVIDER_SITE_OTHER): Payer: Medicare Other | Admitting: Adult Health

## 2023-03-22 VITALS — BP 130/60 | HR 98 | Temp 97.7°F | Ht 63.0 in | Wt 123.0 lb

## 2023-03-22 DIAGNOSIS — J449 Chronic obstructive pulmonary disease, unspecified: Secondary | ICD-10-CM | POA: Diagnosis not present

## 2023-03-22 NOTE — Progress Notes (Signed)
Subjective:    [name], female    DOB: 1941/04/20, 82 y.o.   MRN: [id number]  COPD Her past medical history is significant for COPD.   82 year old female who  has a past medical history of Cancer ([facility]) ( APRIL OF 2000), COPD (chronic obstructive pulmonary disease) ([facility]), Depression, Emphysema of lung ([facility]), GERD (gastroesophageal reflux disease), Hyperlipidemia, and S/p left hip fracture (04/15/2019).  She presents to the office today for concern of worsening COPD.  He is currently prescribed Breo Ellipta 200-25 mcg and albuterol inhaler.  She has not been seen by pulmonary since October 2022.  She does feel as though she was pretty well-controlled on Breo and albuterol but over the last month and 1/2 to 2 months Breo does not seem to be working as well any longer.  She has worsening shortness of breath with walking short distances.  She is having to use her oxygen, greater more often at nighttime.  She has not had any fevers or chills or feeling acutely ill.   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   COPD (chronic obstructive pulmonary disease) ([facility])    Depression    Emphysema of lung ([facility])    GERD (gastroesophageal reflux disease)    Hyperlipidemia    S/p left hip fracture 04/15/2019    Social History   Socioeconomic History   Marital status: Married    Spouse name: Not on file   Number of children: Not on file   Years of education: Not on file   Highest education level: Bachelor's degree (e.g., BA, AB, BS)  Occupational History   Not on file  Tobacco Use   Smoking status: Former    Packs/day: 0.25    Years: 51.00    Additional pack years: 0.00    Total pack years: 12.75    Types: Cigarettes    Quit date: 03/12/2015    Years since quitting: 8.0   Smokeless tobacco: Never  Vaping Use   Vaping Use: Some days  Substance and Sexual Activity   Alcohol use: Yes     Alcohol/week: 0.0 standard drinks of alcohol    Comment: 1 glass of wine monthly   Drug use: No   Sexual activity: Not Currently    Birth control/protection: Post-menopausal  Other Topics Concern   Not on file  Social History Narrative   Married 51 years    One daughter who lives in [place], [place]   - 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       Pets: Dog      Diet: Healthy diet, fruits, veggies and fish.    Exercise: None       Social Determinants of Health   Financial Resource Strain: Low Risk  (10/26/2021)   Overall Financial Resource Strain (CARDIA)    Difficulty of Paying Living Expenses: Not hard at all  Food Insecurity: No Food Insecurity (08/24/2022)   Hunger Vital Sign    Worried About Running Out of Food in the Last Year: Never true    Ran Out of Food in the Last Year: Never true  Transportation Needs: No Transportation Needs (08/24/2022)   PRAPARE - Transportation    Lack of  Transportation (Medical): No    Lack of Transportation (Non-Medical): No  Physical Activity: Unknown (10/26/2021)   Exercise Vital Sign    Days of Exercise per Week: 0 days    Minutes of Exercise per Session: Not on file  Recent Concern: Physical Activity - Inactive (10/26/2021)   Exercise Vital Sign    Days of Exercise per Week: 0 days    Minutes of Exercise per Session: 0 min  Stress: No Stress Concern Present (10/26/2021)   [employer] of Occupational Health - Occupational Stress Questionnaire    Feeling of Stress : Not at all  Social Connections: Moderately Isolated (10/26/2021)   Social Connection and Isolation Panel [NHANES]    Frequency of Communication with Friends and Family: More than three times a week    Frequency of Social Gatherings with Friends and Family: More than three times a week    Attends Religious Services: Never    Database administrator or  Organizations: No    Attends Engineer, structural: Not on file    Marital Status: Married  Catering manager Violence: Not on file    Past Surgical History:  Procedure Laterality Date   CYSTECTOMY W/ URETEROILEAL CONDUIT  1997   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   FRACTURE SURGERY     IR FLUORO GUIDE PORT INSERTION RIGHT  12/07/2017   IR REMOVAL TUN ACCESS W/ PORT W/O FL MOD SED  11/04/2020   IR US GUIDE VASC ACCESS RIGHT  12/07/2017   ORIF HUMERUS FRACTURE Right 11/23/2017   Procedure: OPEN REDUCTION INTERNAL FIXATION (ORIF) HUMERAL SHAFT FRACTURE;  Surgeon: Roby Lofts, MD;  Location: [facility];  Service: Orthopedics;  Laterality: Right;   RIGHT OOPHORECTOMY  1976   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   Procedure: TOTAL HIP ARTHROPLASTY ANTERIOR APPROACH LEFT HIP;  Surgeon: Samson Frederic, MD;  Location: [facility];  Service: Orthopedics;  Laterality: Left;    Family History  Problem Relation Age of Onset   Cancer Mother        colon ca    Allergies  Allergen Reactions   Contrast Media [Iodinated Contrast Media] Shortness Of Breath, Rash and Other (See Comments)    Throat swells   Iohexol Shortness Of Breath   Povidone-Iodine Hives, Shortness Of Breath and Rash    REACTION: throat swells   Penicillins Hives, Itching and Rash    Has patient had a PCN reaction causing immediate rash, facial/tongue/throat swelling, SOB or lightheadedness with hypotension: Unknown Has patient had a PCN reaction causing severe rash involving mucus membranes or skin necrosis: Yes Has patient had a PCN reaction that required hospitalization: Unknown Has patient had a PCN reaction occurring within the last 10 years: Unknown If all of the above answers are "NO", then may proceed with Cephalosporin use.    Adhesive [Tape] Hives   Valacyclovir Other (See Comments)    Current Outpatient Medications on File Prior to Visit  Medication Sig Dispense Refill    albuterol (VENTOLIN HFA) 108 (90 Base) MCG/ACT inhaler Inhale 2 puffs into the lungs every 6 (six) hours as needed for wheezing or shortness of breath. 1 each 3   aspirin EC 81 MG tablet Take 81 mg by mouth daily.     cholecalciferol (VITAMIN D) 1000 units tablet Take 2,000 Units by mouth daily.     fluticasone furoate-vilanterol (BREO ELLIPTA) 200-25 MCG/ACT AEPB Inhale 1 puff into the lungs daily. 60 each 11   Current Facility-Administered Medications  on File Prior to Visit  Medication Dose Route Frequency Provider Last Rate Last Admin   cyanocobalamin (VITAMIN B12) injection 1,000 mcg  1,000 mcg Intramuscular Q30 days Shirline Frees, NP   1,000 mcg at 12/13/22 1103   cyanocobalamin (VITAMIN B12) injection 1,000 mcg  1,000 mcg Intramuscular Once Collen Hostler, NP        BP 130/60   Pulse 98   Temp 97.7 F (36.5 C) (Oral)   Ht 5\' 3"  (1.6 m)   Wt 123 lb (55.8 kg)   LMP  (LMP Unknown)   SpO2 (!) 84%   BMI 21.79 kg/m       Objective:   Physical Exam Vitals and nursing note reviewed.  Constitutional:      Appearance: Normal appearance.  Cardiovascular:     Rate and Rhythm: Normal rate and regular rhythm.     Pulses: Normal pulses.     Heart sounds: Normal heart sounds.  Pulmonary:     Effort: Pulmonary effort is normal.     Breath sounds: Normal breath sounds.  Neurological:     General: No focal deficit present.     Mental Status: She is alert and oriented to person, place, and time.  Psychiatric:        Mood and Affect: Mood normal.        Behavior: Behavior normal.        Thought Content: Thought content normal.        Judgment: Judgment normal.           Assessment & Plan:  1. COPD GOLD II Samples of Breztri were given to the patient, quantity 3, Lot Number [id number] - Advise follow up with Pulmonary   Shirline Frees, NP

## 2023-03-24 ENCOUNTER — Encounter: Payer: Self-pay | Admitting: Hematology and Oncology

## 2023-04-01 ENCOUNTER — Other Ambulatory Visit: Payer: Self-pay | Admitting: Adult Health

## 2023-04-01 MED ORDER — BREZTRI AEROSPHERE 160-9-4.8 MCG/ACT IN AERO: 2.0000 | INHALATION_SPRAY | Freq: Two times a day (BID) | RESPIRATORY_TRACT | 6 refills | Status: DC

## 2023-04-08 ENCOUNTER — Telehealth: Payer: Self-pay

## 2023-04-08 ENCOUNTER — Encounter: Payer: Self-pay | Admitting: Hematology and Oncology

## 2023-04-08 ENCOUNTER — Inpatient Hospital Stay: Payer: Medicare Other | Attending: Hematology and Oncology

## 2023-04-08 ENCOUNTER — Inpatient Hospital Stay (HOSPITAL_BASED_OUTPATIENT_CLINIC_OR_DEPARTMENT_OTHER): Payer: Medicare Other | Admitting: Hematology and Oncology

## 2023-04-08 VITALS — BP 140/77 | HR 91 | Resp 18 | Ht 63.0 in | Wt 123.8 lb

## 2023-04-08 DIAGNOSIS — Z08 Encounter for follow-up examination after completed treatment for malignant neoplasm: Secondary | ICD-10-CM | POA: Diagnosis not present

## 2023-04-08 DIAGNOSIS — T451X5A Adverse effect of antineoplastic and immunosuppressive drugs, initial encounter: Secondary | ICD-10-CM | POA: Diagnosis not present

## 2023-04-08 DIAGNOSIS — C8588 Other specified types of non-Hodgkin lymphoma, lymph nodes of multiple sites: Secondary | ICD-10-CM

## 2023-04-08 DIAGNOSIS — G620 Drug-induced polyneuropathy: Secondary | ICD-10-CM | POA: Insufficient documentation

## 2023-04-08 DIAGNOSIS — Z8572 Personal history of non-Hodgkin lymphomas: Secondary | ICD-10-CM | POA: Diagnosis not present

## 2023-04-08 DIAGNOSIS — E538 Deficiency of other specified B group vitamins: Secondary | ICD-10-CM | POA: Insufficient documentation

## 2023-04-08 LAB — COMPREHENSIVE METABOLIC PANEL
ALT: 10 U/L (ref 0–44)
AST: 16 U/L (ref 15–41)
Albumin: 4.1 g/dL (ref 3.5–5.0)
Alkaline Phosphatase: 89 U/L (ref 38–126)
Anion gap: 8 (ref 5–15)
BUN: 18 mg/dL (ref 8–23)
CO2: 27 mmol/L (ref 22–32)
Calcium: 10 mg/dL (ref 8.9–10.3)
Chloride: 107 mmol/L (ref 98–111)
Creatinine, Ser: 0.74 mg/dL (ref 0.44–1.00)
GFR, Estimated: >60 mL/min (ref >60)
Glucose, Bld: 81 mg/dL (ref 70–99)
Potassium: 4.2 mmol/L (ref 3.5–5.1)
Sodium: 142 mmol/L (ref 135–145)
Total Bilirubin: 0.6 mg/dL (ref 0.3–1.2)
Total Protein: 6.4 g/dL — ABNORMAL LOW (ref 6.5–8.1)

## 2023-04-08 LAB — CBC WITH DIFFERENTIAL/PLATELET
Abs Immature Granulocytes: 0.02 10*3/uL (ref 0.00–0.07)
Basophils Absolute: 0.1 10*3/uL (ref 0.0–0.1)
Basophils Relative: 1 %
Eosinophils Absolute: 0.4 10*3/uL (ref 0.0–0.5)
Eosinophils Relative: 6 %
HCT: 42.4 % (ref 36.0–46.0)
Hemoglobin: 13.7 g/dL (ref 12.0–15.0)
Immature Granulocytes: 0 %
Lymphocytes Relative: 15 %
Lymphs Abs: 1 10*3/uL (ref 0.7–4.0)
MCH: 32.1 pg (ref 26.0–34.0)
MCHC: 32.3 g/dL (ref 30.0–36.0)
MCV: 99.3 fL (ref 80.0–100.0)
Monocytes Absolute: 0.8 10*3/uL (ref 0.1–1.0)
Monocytes Relative: 11 %
Neutro Abs: 4.4 10*3/uL (ref 1.7–7.7)
Neutrophils Relative %: 67 %
Platelets: 241 10*3/uL (ref 150–400)
RBC: 4.27 MIL/uL (ref 3.87–5.11)
RDW: 14.7 % (ref 11.5–15.5)
WBC: 6.6 10*3/uL (ref 4.0–10.5)
nRBC: 0 % (ref 0.0–0.2)

## 2023-04-08 LAB — VITAMIN B12: Vitamin B-12: 363 pg/mL (ref 180–914)

## 2023-04-08 NOTE — Progress Notes (Signed)
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[facility]) Clinically, she have no signs of cancer recurrence She is 5 years out from her last treatment I will discharge her from the clinic  Vitamin B12 deficiency She has history of vitamin B12 deficiency Her B12 level is adequate She will continue monthly injection through her primary care doctor's office  Peripheral neuropathy due to chemotherapy [facility]) She had history of peripheral neuropathy after her prior chemo but it has been 5 years since her last treatment She noticed worsening neuropathy Recommend referral to neuro-oncology for evaluation and she agrees  Orders Placed This Encounter  Procedures   Amb Referral to Neuro Oncology    Referral Priority:   Routine    Referral Type:   Consultation    Referral Reason:   Specialty Services Required    Requested Specialty:   Oncology    Number of Visits Requested:   1    All questions were answered. The patient knows to call the clinic with any problems, questions or concerns. The total time spent in the appointment was 20 minutes encounter with patients including review of chart and various tests results, discussions about plan of care and coordination of care plan   Artis Delay, MD 04/08/2023 1:18 PM  INTERVAL HISTORY: Please see below for problem oriented charting. she returns for surveillance follow-up with her husband She has noted worsening neuropathy She is vigilant about B12 injection monthly through her primary care doctor's office She has no new lymphadenopathy  REVIEW OF SYSTEMS:   Constitutional: Denies fevers, chills or abnormal weight loss Eyes: Denies blurriness of vision Ears, nose, mouth, throat, and face: Denies mucositis or sore throat Respiratory: Denies cough, dyspnea or wheezes Cardiovascular: Denies palpitation, chest  discomfort or lower extremity swelling Gastrointestinal:  Denies nausea, heartburn or change in bowel habits Skin: Denies abnormal skin rashes Lymphatics: Denies new lymphadenopathy or easy bruising Behavioral/Psych: Mood is stable, no new changes  All other systems were reviewed with the patient and are negative.  I have reviewed the past medical history, past surgical history, social history and family history with the patient and they are unchanged from previous note.  ALLERGIES:  is allergic to contrast media [iodinated contrast media], iohexol, povidone-iodine, penicillins, adhesive [tape], and valacyclovir.  MEDICATIONS:  Current Outpatient Medications  Medication Sig Dispense Refill   Alpha-Lipoic Acid (LIPOIC ACID PO) Take 1 capsule by mouth daily. R-Lipoic acid     MAGNESIUM GLYCINATE PO Take 1 tablet by mouth daily.     albuterol (VENTOLIN HFA) 108 (90 Base) MCG/ACT inhaler Inhale 2 puffs into the lungs every 6 (six) hours as needed for wheezing or shortness of breath. 1 each 3   aspirin EC 81 MG tablet Take 81 mg by mouth daily.     Budeson-Glycopyrrol-Formoterol (BREZTRI AEROSPHERE) 160-9-4.8 MCG/ACT AERO Inhale 2 puffs into the lungs in the morning and at bedtime. 10.7 g 6   cholecalciferol (VITAMIN D) 1000 units tablet Take 2,000 Units by mouth daily.     KRILL OIL PO Take by mouth.     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  She was treated with chlorambucil in 2000.   --2007 - 09/2008.  Treated with single agent Rituxan.   --12/2008.  She had progressive adenopathy in chest and abdomen and repeat biopsy showed low grade follicular and diffuse B cell NHL.   --01/2009 - 07/2009. She required left ureteral stent and was treated with Treanda/Rituxan x 5 cycles from 02-2009 thru 06-2009.    09/16/2014 Imaging   No lymphadenopathy in the chest, abdomen, or pelvis. The small right axillary lymph nodes seen on the previous exam are unchanged in the 18 month interval. Small to moderate hiatal hernia.   11/20/2017 Imaging   Mildly displaced and angulated pathologic fracture of the proximal humeral shaft. 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     11/22/2017 PET scan   New hypermetabolic lymphadenopathy within the chest and abdomen, consistent with recurrent lymphoma. New large hypermetabolic mass in the right hepatic lobe, consistent with lymphoma this involvement. New small hypermetabolic mass in the left kidney, which may be due to lymphomatous involvement although primary renal cell carcinoma cannot be excluded. Hypermetabolic mass in the right humerus with pathologic fracture, consistent with lymphomatous involvement. Small hypermetabolic foci in right posterior chest wall, also suspicious for recurrent lymphoma.   11/23/2017 Procedure   Bone, fragment(s), Right Humeral Shaft Reamings - ATYPICAL LYMPHOID INFILTRATE CONSISTENT WITH NON-HODGKIN'S B CELL LYMPHOMA. - SEE ONCOLOGY TABLE. Microscopic Comment LYMPHOMA Histologic type: Non-Hodgkins lymphoma, favor large cell type. Grade (if applicable): Favor high grade. Flow cytometry: No specimen is available for analysis. Immunohistochemical stains: BCL-2, BCL-6, CD3, CD5, CD10, CD15, CD20, CD30, CD34, CD43, LCA, CD79a, CD138, Ki-67, PAX-5 and TdT with appropriate controls as performed on block 1C. Touch preps/imprints: Not  performed. Comments: The sections show multiple, primarily soft tissue fragments displaying a dense lymphoid infiltrate characterized by a mixture of small round to slightly irregular lymphocytes, histiocytes, and large centroblastic appearing lymphoid cells with partially clumped to vesicular chromatin, small nucleoli and amphophilic to clear cytoplasm. The number of admixed large lymphoid cells is variable but they appear relatively abundant in some areas associated with clustering. No material is available for flow cytometric analysis. Hence, a large batter of immunohistochemical stains were performed and show a mixture of T and B cells but with a significant B cell component mostly composed of large lymphoid cells with positivity for CD79a, PAX-5, CD30, BCL-2, and BCL-6. LCA shows diffuse staining although some of the large lymphoid cells appear negative. No significant staining is seen with CD20, CD10, CD15, CD34, TdT, or CD138. The lack of CD20 expression in this material is possibly related to prior treatment. There is variable increased expression of Ki-67 ranging from 10 to >50% in some areas particularly where there is abundance of large lymphoid cells. There is admixed abundant T cell population in the background as seen with CD3, CD5, and CD43 with on apparent co-expression of CD5 in B cell areas. The overall findings are consistent with involvement by non-Hodgkin's B cell lymphoma and the presence of relative abundance of large lymphoid B-cells and increased Ki-67 expression favor a high grade large B cell lymphoma.   11/23/2017 Surgery   1. CPT 24516-Intramedullary nailing of right humerus fracture 2. CPT 20245-Bone biopsy, deep  11/23/2017 - 11/25/2017 Hospital Admission   She was admitted to the hospital for management of humerus fracture and aggressive lymphoma   11/24/2017 Imaging   ECHO showed LV EF: 65% -   70%   11/25/2017 Procedure   Status post ultrasound-guided biopsy of liver  mass. Tissue specimen sent to pathology for complete histopathologic analysis   12/07/2017 Imaging   Successful placement of a right internal jugular approach power injectable Port-A-Cath. The catheter is ready for immediate use.   12/09/2017 - 03/29/2018 Chemotherapy   The patient had R-CHOP chemo     12/17/2017 - 12/20/2017 Hospital Admission   She was hospitalized for influenza infection   01/03/2018 Procedure   Unsuccessful initial L4-5 LP. Successful L2-3 lumbar puncture with installation of methotrexate.   01/24/2018 Procedure   Non complicated injection of intrathecal methotrexate, as above.   02/09/2018 Imaging   LV EF: 55% -   60%   02/10/2018 PET scan   1. Interval response to therapy. There has been resolution of previous hypermetabolic tumor within the chest and abdomen. Deauville criteria 2 and 3. 2. Diffuse radiotracer uptake is identified within the axial and appendicular skeleton which is favored to represent treatment related changes. 3. Significant decrease in FDG uptake associated with previously noted hypermetabolic tumor involving the proximal humeral diaphysis. The FDG uptake within this area on today's study is equivalent to that which is seen in the lumbar spine. 4. Aortic Atherosclerosis (ICD10-I70.0). Lad coronary artery calcifications. 5. Hiatal hernia   02/15/2018 Procedure   Fluoroscopic guided lumbar puncture and intrathecal injection of chemotherapy without complication.   03/31/2018 Procedure   Technically successful fluoroscopic guided lumbar puncture for intrathecal methotrexate injection. Procedure was well tolerated without evidence for immediate complications. Patient will recover in short stay prior to being discharged home   05/08/2018 PET scan   No residual/persistent enlarged or hypermetabolic lymph nodes in the neck, chest, abdomen or pelvis. Findings suggest an excellent response to treatment.   05/25/2018 -  Chemotherapy   The patient had Revlimid for  maintenance chemo    06/07/2018 Imaging   LV EF: 55% -   60%   09/25/2018 PET scan   1. Stable exam. No hypermetabolic lymphadenopathy in the neck, chest, abdomen, or pelvis. 2.  Aortic Atherosclerois (ICD10-170.0) 3.  Emphysema. (ICD10-J43.9)     04/09/2019 Imaging   1. No CT findings without contrast to suggest recurrent lymphoma involving the chest, abdomen or pelvis. 2. Stable advanced atherosclerotic calcifications involving the thoracic and abdominal aorta and branch vessels. 3. Moderate to large hiatal hernia. 4. No significant bony findings.   10/08/2019 Imaging   1. No CT findings to suggest recurrent lymphoma. No lymphadenopathy in the lower neck, chest, abdomen, pelvis or inguinal regions. 2. Stable advanced atherosclerotic calcifications involving the thoracic and abdominal aorta and branch vessels. 3. Moderate to large hiatal hernia. 4. Stable sclerotic bone lesions involving the right ischium and right hip. New left total hip arthroplasty noted.     10/20/2020 Imaging   1. No acute process or evidence of active lymphoma within the chest, abdomen, or pelvis. 2. Degraded evaluation of the pelvis, secondary to beam hardening artifact from left hip arthroplasty. 3. Slight increased calcification within the hepatic dome, likely dystrophic at the site of a dominant mass back on the CT of 12/24/2017. Recommend attention on follow-up. 4. Aortic atherosclerosis (ICD10-I70.0), coronary artery atherosclerosis and emphysema (ICD10-J43.9).     PHYSICAL EXAMINATION: ECOG PERFORMANCE STATUS: 1 - Symptomatic but completely ambulatory  Vitals:   04/08/23 0939  BP: (!) 140/77  Pulse: 91  Resp: 18  SpO2: 97%   Filed Weights   04/08/23 0939  Weight: 123 lb 12.8 oz (56.2 kg)    GENERAL:alert, no distress and comfortable  NEURO: alert & oriented x 3 with fluent speech, no focal motor/sensory deficits  LABORATORY DATA:  I have reviewed the data as listed    Component Value  Date/Time   NA 142 04/08/2023 0920   NA 142 12/28/2016 1002   K 4.2 04/08/2023 0920   K 4.6 12/28/2016 1002   CL 107 04/08/2023 0920   CL 101 03/28/2013 0826   CO2 27 04/08/2023 0920   CO2 29 12/28/2016 1002   GLUCOSE 81 04/08/2023 0920   GLUCOSE 85 12/28/2016 1002   GLUCOSE 84 03/28/2013 0826   BUN 18 04/08/2023 0920   BUN 20.3 12/28/2016 1002   CREATININE 0.74 04/08/2023 0920   CREATININE 0.83 04/08/2022 1124   CREATININE 0.9 12/28/2016 1002   CALCIUM 10.0 04/08/2023 0920   CALCIUM 10.1 12/28/2016 1002   PROT 6.4 (L) 04/08/2023 0920   PROT 6.8 12/28/2016 1002   ALBUMIN 4.1 04/08/2023 0920   ALBUMIN 4.2 12/28/2016 1002   AST 16 04/08/2023 0920   AST 19 04/08/2022 1124   AST 20 12/28/2016 1002   ALT 10 04/08/2023 0920   ALT 11 04/08/2022 1124   ALT 12 12/28/2016 1002   ALKPHOS 89 04/08/2023 0920   ALKPHOS 111 12/28/2016 1002   BILITOT 0.6 04/08/2023 0920   BILITOT 0.5 04/08/2022 1124   BILITOT 0.55 12/28/2016 1002   GFRNONAA >60 04/08/2023 0920   GFRNONAA >60 04/08/2022 1124   GFRAA >60 05/20/2020 0928   GFRAA >60 05/03/2019 1019    No results found for: "SPEP", "UPEP"  Lab Results  Component Value Date   WBC 6.6 04/08/2023   NEUTROABS 4.4 04/08/2023   HGB 13.7 04/08/2023   HCT 42.4 04/08/2023   MCV 99.3 04/08/2023   PLT 241 04/08/2023      Chemistry      Component Value Date/Time   NA 142 04/08/2023 0920   NA 142 12/28/2016 1002   K 4.2 04/08/2023 0920   K 4.6 12/28/2016 1002   CL 107 04/08/2023 0920   CL 101 03/28/2013 0826   CO2 27 04/08/2023 0920   CO2 29 12/28/2016 1002   BUN 18 04/08/2023 0920   BUN 20.3 12/28/2016 1002   CREATININE 0.74 04/08/2023 0920   CREATININE 0.83 04/08/2022 1124   CREATININE 0.9 12/28/2016 1002      Component Value Date/Time   CALCIUM 10.0 04/08/2023 0920   CALCIUM 10.1 12/28/2016 1002   ALKPHOS 89 04/08/2023 0920   ALKPHOS 111 12/28/2016 1002   AST 16 04/08/2023 0920   AST 19 04/08/2022 1124   AST 20  12/28/2016 1002   ALT 10 04/08/2023 0920   ALT 11 04/08/2022 1124   ALT 12 12/28/2016 1002   BILITOT 0.6 04/08/2023 0920   BILITOT 0.5 04/08/2022 1124   BILITOT 0.55 12/28/2016 1002

## 2023-04-08 NOTE — Assessment & Plan Note (Signed)
She has history of vitamin B12 deficiency Her B12 level is adequate She will continue monthly injection through her primary care doctor's office

## 2023-04-08 NOTE — Assessment & Plan Note (Signed)
Clinically, she have no signs of cancer recurrence She is 5 years out from her last treatment I will discharge her from the clinic

## 2023-04-08 NOTE — Telephone Encounter (Signed)
Called and given below message. She verbalized understanding and appreciated the call. 

## 2023-04-08 NOTE — Assessment & Plan Note (Signed)
She had history of peripheral neuropathy after her prior chemo but it has been 5 years since her last treatment She noticed worsening neuropathy Recommend referral to neuro-oncology for evaluation and she agrees

## 2023-04-08 NOTE — Telephone Encounter (Signed)
-----   Message from Artis Delay, MD sent at 04/08/2023 12:03 PM EDT ----- Pls let her know B12 is good I recommend she continues monthl B12 with PCP and have PCP recheck in 6 months

## 2023-04-11 ENCOUNTER — Ambulatory Visit (INDEPENDENT_AMBULATORY_CARE_PROVIDER_SITE_OTHER): Payer: Medicare Other | Admitting: *Deleted

## 2023-04-11 DIAGNOSIS — E538 Deficiency of other specified B group vitamins: Secondary | ICD-10-CM

## 2023-04-11 MED ORDER — CYANOCOBALAMIN 1000 MCG/ML IJ SOLN
1000.0000 ug | Freq: Once | INTRAMUSCULAR | Status: AC
  Administered 2023-04-11: 1000 ug via INTRAMUSCULAR

## 2023-04-11 NOTE — Progress Notes (Signed)
Per orders of Dr. Fry, injection of Cyanocobalamin 1000mcg given by Gryffin Altice A. Patient tolerated injection well.  

## 2023-05-03 ENCOUNTER — Inpatient Hospital Stay: Payer: Medicare Other | Attending: Hematology and Oncology | Admitting: Internal Medicine

## 2023-05-03 ENCOUNTER — Other Ambulatory Visit: Payer: Self-pay

## 2023-05-03 VITALS — BP 155/66 | HR 86 | Temp 97.8°F | Resp 18 | Ht 63.0 in | Wt 123.3 lb

## 2023-05-03 DIAGNOSIS — T451X5A Adverse effect of antineoplastic and immunosuppressive drugs, initial encounter: Secondary | ICD-10-CM

## 2023-05-03 DIAGNOSIS — Z8572 Personal history of non-Hodgkin lymphomas: Secondary | ICD-10-CM | POA: Insufficient documentation

## 2023-05-03 DIAGNOSIS — G620 Drug-induced polyneuropathy: Secondary | ICD-10-CM | POA: Diagnosis not present

## 2023-05-03 DIAGNOSIS — R29818 Other symptoms and signs involving the nervous system: Secondary | ICD-10-CM | POA: Diagnosis not present

## 2023-05-03 DIAGNOSIS — Z08 Encounter for follow-up examination after completed treatment for malignant neoplasm: Secondary | ICD-10-CM | POA: Diagnosis not present

## 2023-05-03 MED ORDER — DULOXETINE HCL 30 MG PO CPEP: 30.0000 mg | ORAL_CAPSULE | Freq: Every day | ORAL | 2 refills | Status: DC

## 2023-05-03 NOTE — Progress Notes (Signed)
[facility] at [facility] [address]  [place], [place] [address] [phone]   New Patient Evaluation  Date of Service: 05/03/23 Patient Name: Brooke Washington Patient MRN: [id number] Patient DOB: [birth year] Provider: Henreitta Leber, MD  Identifying Statement:  Brooke Washington is a 82 y.o. female with Peripheral neuropathy due to chemotherapy [facility]) who presents for initial consultation and evaluation regarding cancer associated neurologic deficits.    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 She was treated with chlorambucil in 2000.   --2007 - 09/2008.  Treated with single agent Rituxan.   --12/2008.  She had progressive adenopathy in chest and abdomen and repeat biopsy showed low grade follicular and diffuse B cell NHL.   --01/2009 - 07/2009. She required left ureteral stent and was treated with Treanda/Rituxan x 5 cycles from 02-2009 thru 06-2009.    09/16/2014 Imaging   No lymphadenopathy in the chest, abdomen, or pelvis. The small right axillary lymph nodes seen on the previous exam are unchanged in the 18 month interval. Small to moderate hiatal hernia.   11/20/2017 Imaging   Mildly displaced and angulated pathologic fracture of the proximal humeral shaft. 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     11/22/2017 PET scan   New hypermetabolic lymphadenopathy within the chest and abdomen, consistent with recurrent lymphoma. New large hypermetabolic mass in the right hepatic lobe,  consistent with lymphoma this involvement. New small hypermetabolic mass in the left kidney, which may be due to lymphomatous involvement although primary renal cell carcinoma cannot be excluded. Hypermetabolic mass in the right humerus with pathologic fracture, consistent with lymphomatous involvement. Small hypermetabolic foci in right posterior chest wall, also suspicious for recurrent lymphoma.   11/23/2017 Procedure   Bone, fragment(s), Right Humeral Shaft Reamings - ATYPICAL LYMPHOID INFILTRATE CONSISTENT WITH NON-HODGKIN'S B CELL LYMPHOMA. - SEE ONCOLOGY TABLE. Microscopic Comment LYMPHOMA Histologic type: Non-Hodgkins lymphoma, favor large cell type. Grade (if applicable): Favor high grade. Flow cytometry: No specimen is available for analysis. Immunohistochemical stains: BCL-2, BCL-6, CD3, CD5, CD10, CD15, CD20, CD30, CD34, CD43, LCA, CD79a, CD138, Ki-67, PAX-5 and TdT with appropriate controls as performed on block 1C. Touch preps/imprints: Not performed. Comments: The sections show multiple, primarily soft tissue fragments displaying a dense lymphoid infiltrate characterized by a mixture of small round to slightly irregular lymphocytes, histiocytes, and large centroblastic appearing lymphoid cells with partially clumped to vesicular chromatin, small nucleoli and amphophilic to clear cytoplasm. The number of admixed large lymphoid cells is variable but they appear relatively abundant in some areas associated with clustering. No material is available for flow cytometric analysis. Hence, a large batter of immunohistochemical stains were performed and show a mixture of T and B cells but with a significant B cell component mostly composed of large lymphoid cells with positivity for CD79a, PAX-5, CD30, BCL-2, and BCL-6. LCA shows diffuse staining although some of the large lymphoid cells appear negative. No significant staining is seen with CD20, CD10, CD15, CD34, TdT, or CD138. The lack of CD20  expression in this material is possibly related  to prior treatment. There is variable increased expression of Ki-67 ranging from 10 to >50% in some areas particularly where there is abundance of large lymphoid cells. There is admixed abundant T cell population in the background as seen with CD3, CD5, and CD43 with on apparent co-expression of CD5 in B cell areas. The overall findings are consistent with involvement by non-Hodgkin's B cell lymphoma and the presence of relative abundance of large lymphoid B-cells and increased Ki-67 expression favor a high grade large B cell lymphoma.   11/23/2017 Surgery   1. CPT 24516-Intramedullary nailing of right humerus fracture 2. CPT 20245-Bone biopsy, deep     11/23/2017 - 11/25/2017 Hospital Admission   She was admitted to the hospital for management of humerus fracture and aggressive lymphoma   11/24/2017 Imaging   ECHO showed LV EF: 65% -   70%   11/25/2017 Procedure   Status post ultrasound-guided biopsy of liver mass. Tissue specimen sent to pathology for complete histopathologic analysis   12/07/2017 Imaging   Successful placement of a right internal jugular approach power injectable Port-A-Cath. The catheter is ready for immediate use.   12/09/2017 - 03/29/2018 Chemotherapy   The patient had R-CHOP chemo     12/17/2017 - 12/20/2017 Hospital Admission   She was hospitalized for influenza infection   01/03/2018 Procedure   Unsuccessful initial L4-5 LP. Successful L2-3 lumbar puncture with installation of methotrexate.   01/24/2018 Procedure   Non complicated injection of intrathecal methotrexate, as above.   02/09/2018 Imaging   LV EF: 55% -   60%   02/10/2018 PET scan   1. Interval response to therapy. There has been resolution of previous hypermetabolic tumor within the chest and abdomen. Deauville criteria 2 and 3. 2. Diffuse radiotracer uptake is identified within the axial and appendicular skeleton which is favored to represent treatment  related changes. 3. Significant decrease in FDG uptake associated with previously noted hypermetabolic tumor involving the proximal humeral diaphysis. The FDG uptake within this area on today's study is equivalent to that which is seen in the lumbar spine. 4. Aortic Atherosclerosis (ICD10-I70.0). Lad coronary artery calcifications. 5. Hiatal hernia   02/15/2018 Procedure   Fluoroscopic guided lumbar puncture and intrathecal injection of chemotherapy without complication.   03/31/2018 Procedure   Technically successful fluoroscopic guided lumbar puncture for intrathecal methotrexate injection. Procedure was well tolerated without evidence for immediate complications. Patient will recover in short stay prior to being discharged home   05/08/2018 PET scan   No residual/persistent enlarged or hypermetabolic lymph nodes in the neck, chest, abdomen or pelvis. Findings suggest an excellent response to treatment.   05/25/2018 -  Chemotherapy   The patient had Revlimid for maintenance chemo    06/07/2018 Imaging   LV EF: 55% -   60%   09/25/2018 PET scan   1. Stable exam. No hypermetabolic lymphadenopathy in the neck, chest, abdomen, or pelvis. 2.  Aortic Atherosclerois (ICD10-170.0) 3.  Emphysema. (ICD10-J43.9)     04/09/2019 Imaging   1. No CT findings without contrast to suggest recurrent lymphoma involving the chest, abdomen or pelvis. 2. Stable advanced atherosclerotic calcifications involving the thoracic and abdominal aorta and branch vessels. 3. Moderate to large hiatal hernia. 4. No significant bony findings.   10/08/2019 Imaging   1. No CT findings to suggest recurrent lymphoma. No lymphadenopathy in the lower neck, chest, abdomen, pelvis or inguinal regions. 2. Stable advanced atherosclerotic calcifications involving the thoracic and abdominal aorta and branch vessels. 3. Moderate to large hiatal  hernia. 4. Stable sclerotic bone lesions involving the right ischium and right hip. New  left total hip arthroplasty noted.     10/20/2020 Imaging   1. No acute process or evidence of active lymphoma within the chest, abdomen, or pelvis. 2. Degraded evaluation of the pelvis, secondary to beam hardening artifact from left hip arthroplasty. 3. Slight increased calcification within the hepatic dome, likely dystrophic at the site of a dominant mass back on the CT of 12/24/2017. Recommend attention on follow-up. 4. Aortic atherosclerosis (ICD10-I70.0), coronary artery atherosclerosis and emphysema (ICD10-J43.9).     History of Present Illness: The patient's records from the referring physician were obtained and reviewed and the patient interviewed to confirm this HPI.  Brooke Washington presents today to review neuropathic symptoms.  She describes pain and scratchy feeling in her feet, rising up to the knees.  No involvement of the hands.  Onset was following IV chemotherapy for lymphoma in 2019.  It has not meaningfully changed over the past 5 years.  She had tried gabapentin which she didn't tolerated well.  Otherwise remains active, independent.  Medications: Current Outpatient Medications on File Prior to Visit  Medication Sig Dispense Refill   albuterol (VENTOLIN HFA) 108 (90 Base) MCG/ACT inhaler Inhale 2 puffs into the lungs every 6 (six) hours as needed for wheezing or shortness of breath. 1 each 3   Alpha-Lipoic Acid (LIPOIC ACID PO) Take 1 capsule by mouth daily. R-Lipoic acid     aspirin EC 81 MG tablet Take 81 mg by mouth daily.     Budeson-Glycopyrrol-Formoterol (BREZTRI AEROSPHERE) 160-9-4.8 MCG/ACT AERO Inhale 2 puffs into the lungs in the morning and at bedtime. 10.7 g 6   cholecalciferol (VITAMIN D) 1000 units tablet Take 2,000 Units by mouth daily.     KRILL OIL PO Take by mouth.     MAGNESIUM GLYCINATE PO Take 1 tablet by mouth daily.     Current Facility-Administered Medications on File Prior to Visit  Medication Dose Route Frequency Provider Last Rate Last Admin    cyanocobalamin (VITAMIN B12) injection 1,000 mcg  1,000 mcg Intramuscular Once Shirline Frees, NP        Allergies:  Allergies  Allergen Reactions   Contrast Media [Iodinated Contrast Media] Shortness Of Breath, Rash and Other (See Comments)    Throat swells   Iohexol Shortness Of Breath   Povidone-Iodine Hives, Shortness Of Breath and Rash    REACTION: throat swells   Penicillins Hives, Itching and Rash    Has patient had a PCN reaction causing immediate rash, facial/tongue/throat swelling, SOB or lightheadedness with hypotension: Unknown Has patient had a PCN reaction causing severe rash involving mucus membranes or skin necrosis: Yes Has patient had a PCN reaction that required hospitalization: Unknown Has patient had a PCN reaction occurring within the last 10 years: Unknown If all of the above answers are "NO", then may proceed with Cephalosporin use.    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   FRACTURE SURGERY     IR FLUORO GUIDE PORT INSERTION RIGHT  12/07/2017   IR REMOVAL TUN ACCESS W/ PORT W/O FL MOD SED  11/04/2020   IR US GUIDE VASC ACCESS RIGHT  12/07/2017   ORIF HUMERUS FRACTURE Right 11/23/2017   Procedure: OPEN REDUCTION INTERNAL FIXATION (ORIF) HUMERAL SHAFT FRACTURE;  Surgeon: Roby Lofts, MD;  Location: [facility] OR;  Service: Orthopedics;  Laterality: Right;   RIGHT OOPHORECTOMY  1976   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   Procedure: TOTAL  HIP ARTHROPLASTY ANTERIOR APPROACH LEFT HIP;  Surgeon: Samson Frederic, MD;  Location: [facility] OR;  Service: Orthopedics;  Laterality: Left;   Social History:  Social History   Socioeconomic History   Marital status: Married    Spouse name: Not on file   Number of children: Not on file   Years of education: Not on file   Highest education level: Bachelor's degree (e.g., BA, AB, BS)  Occupational History   Not on file  Tobacco Use   Smoking status: Former    Current packs/day: 0.00    Average packs/day: 0.3 packs/day for 51.0 years (12.8 ttl pk-yrs)    Types: Cigarettes    Start date: 03/11/1964    Quit date: 03/12/2015    Years since quitting: 8.1   Smokeless tobacco: Never  Vaping Use   Vaping status: Some Days  Substance and Sexual Activity   Alcohol use: Yes    Alcohol/week: 0.0 standard drinks of alcohol    Comment: 1 glass of wine monthly   Drug use: No   Sexual activity: Not Currently    Birth control/protection: Post-menopausal  Other Topics Concern   Not on file  Social History Narrative   Married 51 years    One daughter who lives in [place], [place]   - Electronics engineer for mens [employer]   - 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       Pets: Dog      Diet: Healthy diet, fruits, veggies and fish.    Exercise: None       Social Determinants of Health   Financial Resource Strain: Low Risk  (10/26/2021)   Overall Financial Resource Strain (CARDIA)    Difficulty of Paying Living Expenses: Not hard at all  Food Insecurity: No Food Insecurity (08/24/2022)   Hunger Vital Sign    Worried About Running Out of Food in the Last Year: Never true    Ran Out of Food in the Last Year: Never true  Transportation Needs: No Transportation Needs (08/24/2022)   PRAPARE - Administrator, Civil Service (Medical): No    Lack of Transportation (Non-Medical): No  Physical Activity: Unknown (10/26/2021)    Exercise Vital Sign    Days of Exercise per Week: 0 days    Minutes of Exercise per Session: Not on file  Recent Concern: Physical Activity - Inactive (10/26/2021)   Exercise Vital Sign    Days of Exercise per Week: 0 days    Minutes of Exercise per Session: 0 min  Stress: No Stress Concern Present (10/26/2021)   [employer] of Occupational Health - Occupational Stress Questionnaire    Feeling of Stress : Not at all  Social Connections: Moderately Isolated (10/26/2021)   Social Connection and Isolation Panel [NHANES]    Frequency of Communication with  Friends and Family: More than three times a week    Frequency of Social Gatherings with Friends and Family: More than three times a week    Attends Religious Services: Never    Database administrator or Organizations: No    Attends Engineer, structural: Not on file    Marital Status: Married  Catering manager Violence: Not on file   Family History:  Family History  Problem Relation Age of Onset   Cancer Mother        colon ca    Review of Systems: Constitutional: Doesn't report fevers, chills or abnormal weight loss Eyes: Doesn't report blurriness of vision Ears, nose, mouth, throat, and face: Doesn't report sore throat Respiratory: Doesn't report cough, dyspnea or wheezes Cardiovascular: Doesn't report palpitation, chest discomfort  Gastrointestinal:  Doesn't report nausea, constipation, diarrhea GU: Doesn't report incontinence Skin: Doesn't report skin rashes Neurological: Per HPI Musculoskeletal: Doesn't report joint pain Behavioral/Psych: Doesn't report anxiety  Physical Exam: Vitals:   05/03/23 0958  BP: (!) 155/66  Pulse: 86  Resp: 18  Temp: 97.8 F (36.6 C)  SpO2: 95%   KPS: 80. General: Alert, cooperative, pleasant, in no acute distress Head: Normal EENT: No conjunctival injection or scleral icterus.  Lungs: Resp effort normal Cardiac: Regular rate Abdomen: Non-distended abdomen Skin: No  rashes cyanosis or petechiae. Extremities: No clubbing or edema  Neurologic Exam: Mental Status: Awake, alert, attentive to examiner. Oriented to self and environment. Language is fluent with intact comprehension.  Cranial Nerves: Visual acuity is grossly normal. Visual fields are full. Extra-ocular movements intact. No ptosis. Face is symmetric Motor: Tone and bulk are normal. Power is full in both arms and legs. Reflexes are symmetric, no pathologic reflexes present.  Sensory: Stocking sensory impairment Gait: Normal.   Labs: I have reviewed the data as listed    Component Value Date/Time   NA 142 04/08/2023 0920   NA 142 12/28/2016 1002   K 4.2 04/08/2023 0920   K 4.6 12/28/2016 1002   CL 107 04/08/2023 0920   CL 101 03/28/2013 0826   CO2 27 04/08/2023 0920   CO2 29 12/28/2016 1002   GLUCOSE 81 04/08/2023 0920   GLUCOSE 85 12/28/2016 1002   GLUCOSE 84 03/28/2013 0826   BUN 18 04/08/2023 0920   BUN 20.3 12/28/2016 1002   CREATININE 0.74 04/08/2023 0920   CREATININE 0.83 04/08/2022 1124   CREATININE 0.9 12/28/2016 1002   CALCIUM 10.0 04/08/2023 0920   CALCIUM 10.1 12/28/2016 1002   PROT 6.4 (L) 04/08/2023 0920   PROT 6.8 12/28/2016 1002   ALBUMIN 4.1 04/08/2023 0920   ALBUMIN 4.2 12/28/2016 1002   AST 16 04/08/2023 0920   AST 19 04/08/2022 1124   AST 20 12/28/2016 1002   ALT 10 04/08/2023 0920   ALT 11 04/08/2022 1124   ALT 12 12/28/2016 1002   ALKPHOS 89 04/08/2023 0920   ALKPHOS 111 12/28/2016 1002   BILITOT 0.6 04/08/2023 0920   BILITOT 0.5 04/08/2022 1124   BILITOT 0.55 12/28/2016 1002   GFRNONAA >60 04/08/2023 0920   GFRNONAA >60 04/08/2022 1124   GFRAA >60 05/20/2020 0928   GFRAA >60 05/03/2019 1019   Lab Results  Component Value Date   WBC 6.6 04/08/2023   NEUTROABS 4.4 04/08/2023   HGB 13.7 04/08/2023   HCT 42.4 04/08/2023   MCV 99.3 04/08/2023   PLT 241 04/08/2023    Assessment/Plan Peripheral neuropathy due to chemotherapy [facility])  Brooke Washington presents with  clinical syndrome consistent with symmetric, length dependent, small and large fiber peripheral neuropathy.  Etiology is exposure to vincristine chemotherapy.  We reviewed pathophysiology of chemotherapy induced neuropathy, available treatments, and goals of care.  She is interested in trial of cymbalta 30mg  daily, given poor tolerance of gabapentin previously.   We spent twenty additional minutes teaching regarding the natural history, biology, and historical experience in the treatment of neurologic complications of cancer.   We appreciate the opportunity to participate in the care of [facility].  We will touch base with her in ~1 month via phone to assess response to cymbalta.   All questions were answered. The patient knows to call the clinic with any problems, questions or concerns. No barriers to learning were detected.  The total time spent in the encounter was 40 minutes and more than 50% was on counseling and review of test results   Henreitta Leber, MD Medical Director of Neuro-Oncology [facility] at [facility] 05/03/23 2:40 PM

## 2023-05-16 ENCOUNTER — Ambulatory Visit (INDEPENDENT_AMBULATORY_CARE_PROVIDER_SITE_OTHER): Payer: Medicare Other | Admitting: *Deleted

## 2023-05-16 DIAGNOSIS — E538 Deficiency of other specified B group vitamins: Secondary | ICD-10-CM | POA: Diagnosis not present

## 2023-05-16 MED ORDER — CYANOCOBALAMIN 1000 MCG/ML IJ SOLN
1000.0000 ug | Freq: Once | INTRAMUSCULAR | Status: AC
  Administered 2023-05-16: 1000 ug via INTRAMUSCULAR

## 2023-05-16 NOTE — Progress Notes (Signed)
Per orders of Dr. Fry, injection of Cyanocobalamin 1000mcg given by ,  A. Patient tolerated injection well.  

## 2023-05-26 ENCOUNTER — Encounter (INDEPENDENT_AMBULATORY_CARE_PROVIDER_SITE_OTHER): Payer: Self-pay

## 2023-05-31 ENCOUNTER — Inpatient Hospital Stay: Payer: Medicare Other | Attending: Hematology and Oncology | Admitting: Internal Medicine

## 2023-05-31 DIAGNOSIS — G620 Drug-induced polyneuropathy: Secondary | ICD-10-CM | POA: Diagnosis not present

## 2023-05-31 DIAGNOSIS — T451X5A Adverse effect of antineoplastic and immunosuppressive drugs, initial encounter: Secondary | ICD-10-CM

## 2023-05-31 MED ORDER — DULOXETINE HCL 30 MG PO CPEP: 30.0000 mg | ORAL_CAPSULE | Freq: Every day | ORAL | 2 refills | Status: DC

## 2023-05-31 NOTE — Progress Notes (Signed)
I connected with Brooke Washington on 05/31/23 at 10:00 AM EDT by telephone visit and verified that I am speaking with the correct person using two identifiers.  I discussed the limitations, risks, security and privacy concerns of performing an evaluation and management service by telemedicine and the availability of in-person appointments. I also discussed with the patient that there may be a patient responsible charge related to this service. The patient expressed understanding and agreed to proceed.  Other persons participating in the visit and their role in the encounter:  n/a   Patient's location:  Home Provider's location:  Office Chief Complaint:  Peripheral neuropathy due to chemotherapy [facility])  History of Present Ilness: Brooke Washington reports significant improvement in neuropathy pain symptoms since starting the cymbalta.  She still has some lingering numbness.  No other new or progressive changes.    Observations: Language and cognition at baseline  Assessment and Plan: Peripheral neuropathy due to chemotherapy (HCC)  Clinically improved, pain is resolved subjectively.  Will continue cymbalta 30mg  daily as prior.  Follow Up Instructions: RTC as needed  I discussed the assessment and treatment plan with the patient.  The patient was provided an opportunity to ask questions and all were answered.  The patient agreed with the plan and demonstrated understanding of the instructions.    The patient was advised to call back or seek an in-person evaluation if the symptoms worsen or if the condition fails to improve as anticipated.    Henreitta Leber, MD   I provided 22 minutes of non face-to-face telephone visit time during this encounter, and > 50% was spent counseling as documented under my assessment & plan.

## 2023-06-20 ENCOUNTER — Ambulatory Visit (INDEPENDENT_AMBULATORY_CARE_PROVIDER_SITE_OTHER): Payer: Medicare Other

## 2023-06-20 DIAGNOSIS — E538 Deficiency of other specified B group vitamins: Secondary | ICD-10-CM | POA: Diagnosis not present

## 2023-06-20 MED ORDER — CYANOCOBALAMIN 1000 MCG/ML IJ SOLN
1000.0000 ug | Freq: Once | INTRAMUSCULAR | Status: AC
  Administered 2023-06-20: 1000 ug via INTRAMUSCULAR

## 2023-06-20 NOTE — Progress Notes (Signed)
Per orders of Dr. Clent Ridges, injection of B12 given by Vickii Chafe on Left Deltoid. Patient tolerated injection well.

## 2023-06-24 DIAGNOSIS — Z23 Encounter for immunization: Secondary | ICD-10-CM | POA: Diagnosis not present

## 2023-07-19 ENCOUNTER — Ambulatory Visit (INDEPENDENT_AMBULATORY_CARE_PROVIDER_SITE_OTHER): Payer: Medicare Other

## 2023-07-19 DIAGNOSIS — E538 Deficiency of other specified B group vitamins: Secondary | ICD-10-CM

## 2023-07-19 MED ORDER — CYANOCOBALAMIN 1000 MCG/ML IJ SOLN
1000.0000 ug | Freq: Once | INTRAMUSCULAR | Status: AC
  Administered 2023-07-19: 1000 ug via INTRAMUSCULAR

## 2023-07-19 NOTE — Patient Instructions (Signed)
Health Maintenance Due  Topic Date Due   Zoster Vaccines- Shingrix (1 of 2) Never done   DEXA SCAN  Never done   DTaP/Tdap/Td (2 - Tdap) 10/11/2006   Medicare Annual Wellness (AWV)  06/15/2022   INFLUENZA VACCINE  05/12/2023   COVID-19 Vaccine (4 - 2023-24 season) 06/12/2023       08/03/2022   11:31 AM 06/15/2021   11:51 AM 08/19/2020    8:33 AM  Depression screen PHQ 2/9  Decreased Interest 0 0 0  Down, Depressed, Hopeless 0 0 0  PHQ - 2 Score 0 0 0

## 2023-07-19 NOTE — Progress Notes (Signed)
Per orders of Nafziger, Kandee Keen, NP, injection of B12 given in Right deltoid by Sherrin Daisy. Patient tolerated injection well.  Lab Results  Component Value Date   VITAMINB12 363 04/08/2023

## 2023-07-29 DIAGNOSIS — Z23 Encounter for immunization: Secondary | ICD-10-CM | POA: Diagnosis not present

## 2023-08-18 ENCOUNTER — Ambulatory Visit: Payer: Medicare Other

## 2023-08-18 DIAGNOSIS — E538 Deficiency of other specified B group vitamins: Secondary | ICD-10-CM | POA: Diagnosis not present

## 2023-08-18 NOTE — Patient Instructions (Signed)
Health Maintenance Due  Topic Date Due   Zoster Vaccines- Shingrix (1 of 2) Never done   DEXA SCAN  Never done   DTaP/Tdap/Td (2 - Tdap) 10/11/2006   Medicare Annual Wellness (AWV)  06/15/2022   INFLUENZA VACCINE  05/12/2023   COVID-19 Vaccine (4 - 2023-24 season) 06/12/2023       08/03/2022   11:31 AM 06/15/2021   11:51 AM 08/19/2020    8:33 AM  Depression screen PHQ 2/9  Decreased Interest 0 0 0  Down, Depressed, Hopeless 0 0 0  PHQ - 2 Score 0 0 0

## 2023-08-18 NOTE — Progress Notes (Signed)
Per orders of Nafziger, Kandee Keen, NP, injection of B12 given in right deltoid by Sherrin Daisy. Patient tolerated injection well.  Lab Results  Component Value Date   VITAMINB12 363 04/08/2023

## 2023-09-01 DIAGNOSIS — H26492 Other secondary cataract, left eye: Secondary | ICD-10-CM | POA: Diagnosis not present

## 2023-09-01 DIAGNOSIS — H524 Presbyopia: Secondary | ICD-10-CM | POA: Diagnosis not present

## 2023-10-19 ENCOUNTER — Other Ambulatory Visit: Payer: Self-pay | Admitting: Adult Health

## 2023-10-19 DIAGNOSIS — L309 Dermatitis, unspecified: Secondary | ICD-10-CM | POA: Diagnosis not present

## 2023-10-19 DIAGNOSIS — L089 Local infection of the skin and subcutaneous tissue, unspecified: Secondary | ICD-10-CM | POA: Diagnosis not present

## 2023-10-19 DIAGNOSIS — T148XXA Other injury of unspecified body region, initial encounter: Secondary | ICD-10-CM | POA: Diagnosis not present

## 2023-12-27 ENCOUNTER — Telehealth: Payer: Self-pay

## 2023-12-27 DIAGNOSIS — I872 Venous insufficiency (chronic) (peripheral): Secondary | ICD-10-CM | POA: Diagnosis not present

## 2023-12-27 DIAGNOSIS — I772 Rupture of artery: Secondary | ICD-10-CM | POA: Diagnosis not present

## 2023-12-27 DIAGNOSIS — L281 Prurigo nodularis: Secondary | ICD-10-CM | POA: Diagnosis not present

## 2023-12-27 DIAGNOSIS — I739 Peripheral vascular disease, unspecified: Secondary | ICD-10-CM | POA: Diagnosis not present

## 2023-12-27 NOTE — Telephone Encounter (Signed)
 Copied from CRM [id number]. Topic: Clinical - Medication Question >> Dec 27, 2023  3:19 PM Sonny Dandy B wrote: Reason for CRM: Elpidio Anis  PA from dermatology Specialist called from [phone]. Called to speak with provider with concerns about  the patient/ State pt has recurring ulcers with cold feet, concerned pt may have arterial disease. States pt needs to be sent to a vein specialist as soon as possible.

## 2023-12-28 NOTE — Telephone Encounter (Signed)
 Left message to return phone call.

## 2023-12-28 NOTE — Telephone Encounter (Signed)
 Pt has been scheduled.

## 2023-12-28 NOTE — Telephone Encounter (Signed)
**Note De-identified  [place] Obfuscation** Please advise 

## 2023-12-29 ENCOUNTER — Ambulatory Visit: Admitting: Adult Health

## 2023-12-29 VITALS — BP 136/80 | HR 98 | Temp 98.0°F | Ht 63.0 in | Wt 123.0 lb

## 2023-12-29 DIAGNOSIS — I739 Peripheral vascular disease, unspecified: Secondary | ICD-10-CM | POA: Diagnosis not present

## 2023-12-29 DIAGNOSIS — I872 Venous insufficiency (chronic) (peripheral): Secondary | ICD-10-CM

## 2023-12-29 NOTE — Progress Notes (Signed)
 Subjective:    [name], female    DOB: February 06, 1941, 83 y.o.   MRN: [id number]  HPI 83 year old female who  has a past medical history of Cancer (HCC) ( APRIL OF 2000), COPD (chronic obstructive pulmonary disease) (HCC), Depression, Emphysema of lung (HCC), GERD (gastroesophageal reflux disease), Hyperlipidemia, and S/p left hip fracture (04/15/2019).  She presents to the office today for for concern of vascular issues in her feet. She was recently seen by dermatology and noticed that she had some ulcers on both feet. She was advised to follow up as she would likely need a vascular consult.  Today she reports that over the last 2-1/2 months or so she has noticed swelling in both feet, swelling is worse when she gets up in the morning but will improve throughout the day as well as purplish discoloration to both feet About 2 months ago she developed a wound on her left heel and has noticed other small wounds on both feet.  She is currently on Keflex and antibiotic cream that was prescribed by dermatology.   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   COPD (chronic obstructive pulmonary disease) (HCC)    Depression    Emphysema of lung (HCC)    GERD (gastroesophageal reflux disease)    Hyperlipidemia    S/p left hip fracture 04/15/2019    Social History   Socioeconomic History   Marital status: Married    Spouse name: Not on file   Number of children: Not on file   Years of education: Not on file   Highest education level: Bachelor's degree (e.g., BA, AB, BS)  Occupational History   Not on file  Tobacco Use   Smoking status: Former    Current packs/day: 0.00    Average packs/day: 0.3 packs/day for 51.0 years (12.8 ttl pk-yrs)    Types: Cigarettes    Start date: 03/11/1964    Quit date: 03/12/2015    Years since quitting: 8.8   Smokeless tobacco: Never  Vaping Use   Vaping  status: Some Days  Substance and Sexual Activity   Alcohol use: Yes    Alcohol/week: 0.0 standard drinks of alcohol    Comment: 1 glass of wine monthly   Drug use: No   Sexual activity: Not Currently    Birth control/protection: Post-menopausal  Other Topics Concern   Not on file  Social History Narrative   Married 51 years    One daughter who lives in [place], [place]   - Electronics engineer for [employer]   - 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       Pets: Dog      Diet: Healthy diet, fruits, veggies and fish.    Exercise: None       Social Drivers of Health   Financial Resource Strain: Low Risk  (10/26/2021)   Overall Financial Resource Strain (CARDIA)    Difficulty of Paying Living Expenses: Not hard at all  Food Insecurity: No Food Insecurity (08/24/2022)   Hunger Vital Sign    Worried About Running Out of Food in the Last Year: Never true    Ran Out of Food in the Last Year: Never true  Transportation Needs: No Transportation Needs (08/24/2022)  PRAPARE - Administrator, Civil Service (Medical): No    Lack of Transportation (Non-Medical): No  Physical Activity: Unknown (10/26/2021)   Exercise Vital Sign    Days of Exercise per Week: 0 days    Minutes of Exercise per Session: Not on file  Recent Concern: Physical Activity - Inactive (10/26/2021)   Exercise Vital Sign    Days of Exercise per Week: 0 days    Minutes of Exercise per Session: 0 min  Stress: No Stress Concern Present (10/26/2021)   [employer] of Occupational Health - Occupational Stress Questionnaire    Feeling of Stress : Not at all  Social Connections: Moderately Isolated (10/26/2021)   Social Connection and Isolation Panel [NHANES]    Frequency of Communication with Friends and Family: More than three times a week    Frequency of Social Gatherings with Friends and Family: More than three times a week    Attends  Religious Services: Never    Database administrator or Organizations: No    Attends Engineer, structural: Not on file    Marital Status: Married  Catering manager Violence: Not on file    Past Surgical History:  Procedure Laterality Date   CYSTECTOMY W/ URETEROILEAL CONDUIT  1997   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   FRACTURE SURGERY     IR FLUORO GUIDE PORT INSERTION RIGHT  12/07/2017   IR REMOVAL TUN ACCESS W/ PORT W/O FL MOD SED  11/04/2020   IR US GUIDE VASC ACCESS RIGHT  12/07/2017   ORIF HUMERUS FRACTURE Right 11/23/2017   Procedure: OPEN REDUCTION INTERNAL FIXATION (ORIF) HUMERAL SHAFT FRACTURE;  Surgeon: Roby Lofts, MD;  Location: [facility] OR;  Service: Orthopedics;  Laterality: Right;   RIGHT OOPHORECTOMY  1976   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   Procedure: TOTAL HIP ARTHROPLASTY ANTERIOR APPROACH LEFT HIP;  Surgeon: Samson Frederic, MD;  Location: [facility] OR;  Service: Orthopedics;  Laterality: Left;    Family History  Problem Relation Age of Onset   Cancer Mother        colon ca    Allergies  Allergen Reactions   Contrast Media [Iodinated Contrast Media] Shortness Of Breath, Rash and Other (See Comments)    Throat swells   Iohexol Shortness Of Breath   Povidone-Iodine Hives, Shortness Of Breath and Rash    REACTION: throat swells   Penicillins Hives, Itching and Rash    Has patient had a PCN reaction causing immediate rash, facial/tongue/throat swelling, SOB or lightheadedness with hypotension: Unknown Has patient had a PCN reaction causing severe rash involving mucus membranes or skin necrosis: Yes Has patient had a PCN reaction that required hospitalization: Unknown Has patient had a PCN reaction occurring within the last 10 years: Unknown If all of the above answers are "NO", then may proceed with Cephalosporin use.    Adhesive [Tape] Hives   Valacyclovir Other (See Comments)    Current Outpatient Medications on File  Prior to Visit  Medication Sig Dispense Refill   cephALEXin (KEFLEX) 500 MG capsule 1 capsule Orally Twice a day for 10 days     mupirocin ointment (BACTROBAN) 2 % 1 application Externally Twice a day for 14 days     albuterol (VENTOLIN HFA) 108 (90 Base) MCG/ACT inhaler Inhale 2 puffs into the lungs every 6 (six) hours as needed for wheezing or shortness of breath. 1 each 3   aspirin EC 81 MG tablet Take 81 mg by  mouth daily.     BREZTRI AEROSPHERE 160-9-4.8 MCG/ACT AERO INHALE 2 PUFFS INTO THE LUNGS IN THE MORNING AND AT BEDTIME 10.7 g 6   cholecalciferol (VITAMIN D) 1000 units tablet Take 2,000 Units by mouth daily.     DULoxetine (CYMBALTA) 30 MG capsule Take 1 capsule (30 mg total) by mouth daily. 60 capsule 2   KRILL OIL PO Take by mouth.     MAGNESIUM GLYCINATE PO Take 1 tablet by mouth daily.     triamcinolone cream (KENALOG) 0.1 % Apply topically.     No current facility-administered medications on file prior to visit.    BP 136/80   Pulse 98   Temp 98 F (36.7 C) (Oral)   Ht 5\' 3"  (1.6 m)   Wt 123 lb (55.8 kg)   LMP  (LMP Unknown)   SpO2 94%   BMI 21.79 kg/m       Objective:   Physical Exam Vitals and nursing note reviewed.  Constitutional:      Appearance: Normal appearance.  Cardiovascular:     Rate and Rhythm: Normal rate and regular rhythm.     Pulses:          Dorsalis pedis pulses are 1+ on the right side and 1+ on the left side.       Posterior tibial pulses are 1+ on the right side and 1+ on the left side.     Heart sounds: Normal heart sounds.     Comments: She does have some raised pitting edema across the dorsal aspect of both feet.  Nonpitting edema noted around lateral malleolus.  Normal cap refill. Feet are warm Pulmonary:     Effort: Pulmonary effort is normal.     Breath sounds: Normal breath sounds.  Musculoskeletal:        General: Normal range of motion.     Right lower leg: Edema present.     Left lower leg: Edema present.        Feet:  Skin:    General: Skin is warm and dry.     Comments: Cyanosis noted in both feet  Neurological:     General: No focal deficit present.     Mental Status: She is alert and oriented to person, place, and time.  Psychiatric:        Mood and Affect: Mood normal.        Behavior: Behavior normal.        Thought Content: Thought content normal.        Judgment: Judgment normal.        Assessment & Plan:  1. PAD (peripheral artery disease) (HCC) (Primary)  - Ambulatory referral to Vascular Surgery  2. Stasis dermatitis of both legs - Keep wounds clean and dry. Continue with abx therapy - Ambulatory referral to Vascular Surgery  Shirline Frees, NP   Time spent with patient today was 32 minutes which consisted of chart review, discussing PAD and venous stasis ulcers, work up, treatment answering questions and documentation.

## 2024-01-03 ENCOUNTER — Other Ambulatory Visit: Payer: Self-pay | Admitting: Internal Medicine

## 2024-01-13 ENCOUNTER — Ambulatory Visit: Payer: Self-pay | Admitting: Adult Health

## 2024-01-13 ENCOUNTER — Ambulatory Visit: Payer: Self-pay

## 2024-01-13 ENCOUNTER — Ambulatory Visit (INDEPENDENT_AMBULATORY_CARE_PROVIDER_SITE_OTHER): Admitting: Family Medicine

## 2024-01-13 VITALS — BP 114/66 | HR 94 | Temp 97.7°F | Wt 121.8 lb

## 2024-01-13 DIAGNOSIS — L282 Other prurigo: Secondary | ICD-10-CM | POA: Diagnosis not present

## 2024-01-13 MED ORDER — PREDNISONE 10 MG PO TABS: ORAL_TABLET | ORAL | 0 refills | Status: DC

## 2024-01-13 NOTE — Telephone Encounter (Signed)
 Second attempt to reach husband. Left message to call back. Pt. Has an appointment today for symptoms.

## 2024-01-13 NOTE — Telephone Encounter (Signed)
 Noted.

## 2024-01-13 NOTE — Telephone Encounter (Signed)
 Copied from CRM [id number]. Topic: Clinical - Red Word Triage >> Jan 13, 2024 10:17 AM Marland Kitchen D wrote: Patient is having severe itching and splotches on the back   Chief Complaint: Rash, itching Symptoms: rash, itching Frequency: 2 days ago Pertinent Negatives: Patient denies ---- Disposition: [] ED /[] Urgent Care (no appt availability in office) / [] Appointment(In office/virtual)/ []  [facility]/ [] Home Care/ [] Refused Recommended Disposition /[] [facility]/ []  Follow-up with PCP Additional Notes: Patient's husband called and advised that about 2-3 days ago the patient has a rash on her back that   He advised that she had Keflex about two weeks ago and she has been off of it for about a week.   Disconnected from patient's husband during the call--Called back--No answer--Left a voicemail on the [phone] number

## 2024-01-13 NOTE — Progress Notes (Signed)
 Established Patient Office Visit  Subjective   [name], female    DOB: 1941/02/15  Age: 83 y.o. MRN: [id number]  Chief Complaint  Patient presents with   Rash    Patient complains of rash on back and arms, x3 days     HPI   Brooke Washington is seen with fairly severely pruritic rash involving her upper extremities and back for the past few days.  She did recently complete course of Keflex per dermatology for possible left ankle cellulitis.  She has open wound left lateral ankle that has been slow to heal.  No recent urticaria.  Does have reported penicillin allergy.  For current rash she started Zyrtec which has not helped much.  Has obtained some mild relief with moisturizing lotion which was Sarna.  No fever.  Husband has no similar rash.  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   FRACTURE SURGERY     IR FLUORO GUIDE PORT INSERTION RIGHT  12/07/2017   IR REMOVAL TUN ACCESS W/ PORT W/O FL MOD SED  11/04/2020   IR US GUIDE VASC ACCESS RIGHT  12/07/2017   ORIF HUMERUS FRACTURE Right 11/23/2017   Procedure: OPEN REDUCTION INTERNAL FIXATION (ORIF) HUMERAL SHAFT FRACTURE;  Surgeon: Roby Lofts, MD;  Location: [facility] OR;  Service: Orthopedics;  Laterality: Right;   RIGHT OOPHORECTOMY  1976   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   Procedure: TOTAL HIP ARTHROPLASTY ANTERIOR APPROACH LEFT HIP;  Surgeon: Samson Frederic, MD;  Location: [facility] OR;  Service: Orthopedics;  Laterality: Left;    reports that she quit smoking about 8 years ago. Her smoking use included  cigarettes. She started smoking about 59 years ago. She has a 12.8 pack-year smoking history. She has never used smokeless tobacco. She reports current alcohol use. She reports that she does not use drugs. family history includes Cancer in her mother. Allergies  Allergen Reactions   Contrast Media [Iodinated Contrast Media] Shortness Of Breath, Rash and Other (See Comments)    Throat swells   Iohexol Shortness Of Breath   Povidone-Iodine Hives, Shortness Of Breath and Rash    REACTION: throat swells   Penicillins Hives, Itching and Rash    Has patient had a PCN reaction causing immediate rash, facial/tongue/throat swelling, SOB or lightheadedness with hypotension: Unknown Has patient had a PCN reaction causing severe rash involving mucus membranes or skin necrosis: Yes Has patient had a PCN reaction that required hospitalization: Unknown Has patient had a PCN reaction occurring within the last 10 years: Unknown If all of the above answers are "NO", then may proceed with Cephalosporin use.    Adhesive [Tape] Hives   Valacyclovir Other (See Comments)    Review of Systems  Constitutional:  Negative for chills and fever.  Skin:  Positive for rash.      Objective:     BP 114/66 (BP Location: Left Arm, Patient Position: Sitting, Cuff Size: Normal)   Pulse 94   Temp 97.7 F (36.5 C) (Oral)   Wt 121 lb  12.8 oz (55.2 kg)   LMP  (LMP Unknown)   SpO2 94%   BMI 21.58 kg/m  BP Readings from Last 3 Encounters:  01/13/24 114/66  12/29/23 136/80  05/03/23 (!) 155/66   Wt Readings from Last 3 Encounters:  01/13/24 121 lb 12.8 oz (55.2 kg)  12/29/23 123 lb (55.8 kg)  05/03/23 123 lb 4.8 oz (55.9 kg)      Physical Exam Vitals reviewed.  Constitutional:      General: She is not in acute distress.    Appearance: She is not ill-appearing.  Cardiovascular:     Rate and Rhythm: Normal rate and regular rhythm.  Skin:    Findings: Rash present.     Comments: She has some scattered  nonspecific erythematous papules on her upper extremities and back with several excoriations and generally dry skin.  No signs of secondary infection.  No pustules.  No vesicles.  Neurological:     Mental Status: She is alert.      No results found for any visits on 01/13/24.    The ASCVD Risk score (Arnett DK, et al., 2019) failed to calculate for the following reasons:   The 2019 ASCVD risk score is only valid for ages 28 to 70    Assessment & Plan:   Pruritic follicular type rash involving upper extremities and back with sparing of lower extremities and anterior trunk.  Etiology unclear.  This does not really look like a typical drug allergy type rash.    - Recommend liberal use of moisturizers such as CeraVe or Eucerin - She already has large tub of triamcinolone 0.1% cream and will use this concomitant with moisturizer - Given severity of her pruritus we did offer low-dose prednisone taper.  We reviewed potential side effects of prednisone.  Evelena Peat, MD

## 2024-01-13 NOTE — Patient Instructions (Signed)
 Add the Triamcinolone cream and apply equal parts of moisturizer and apply twice daily.

## 2024-01-13 NOTE — Telephone Encounter (Signed)
 Chief Complaint: Rash on back appeared 3 days ago  Symptoms: Itching, redness Pertinent Negatives: Patient denies pain, fever (not sure hasn't been checked)  Disposition: [x] Appointment(In office)  Additional Notes: Spoke with pt's husband, Molly Maduro. Pt husband tried to get pt an appt with a dermatologist but can't get one until next Wednesday. Pt husband thinks the rash is from the Keflex that was given to pt 2 weeks ago. Pt scheduled for an appointment today at PCP office with a different provider as no availability with PCP today. This RN educated pt husband on new-worsening symptoms and when to call back/seek emergent care. Pt husband verbalized understanding and agrees to plan.   Copied from CRM [id number]. Topic: Clinical - Red Word Triage >> Jan 13, 2024 10:17 AM Marland Kitchen D wrote: Patient is having severe itching and splotches on the back Reason for Disposition  [1] Looks infected (spreading redness, pus) AND [2] large red area (> 2 in. or 5 cm)  Answer Assessment - Initial Assessment Questions 1. APPEARANCE of RASH: "Describe the rash."      Covers 3/4 of back 2. LOCATION: "Where is the rash located?"      Back 3. NUMBER: "How many spots are there?"      100 or more spots 4. SIZE: "How big are the spots?" (Inches, centimeters or compare to size of a coin)      "Some are the size of little finger nail and some are smaller than that" 5. ONSET: "When did the rash start?"      3 days ago 6. ITCHING: "Does the rash itch?" If Yes, ask: "How bad is the itch?"  (Scale 0-10; or none, mild, moderate, severe)     Moderate to severe 7. PAIN: "Does the rash hurt?" If Yes, ask: "How bad is the pain?"  (Scale 0-10; or none, mild, moderate, severe)    - NONE (0): no pain    - MILD (1-3): doesn't interfere with normal activities     - MODERATE (4-7): interferes with normal activities or awakens from sleep     - SEVERE (8-10): excruciating pain, unable to do any normal activities      Denies  Protocols used: Rash or Redness - Localized-A-AH

## 2024-01-18 DIAGNOSIS — I739 Peripheral vascular disease, unspecified: Secondary | ICD-10-CM | POA: Diagnosis not present

## 2024-01-18 DIAGNOSIS — I872 Venous insufficiency (chronic) (peripheral): Secondary | ICD-10-CM | POA: Diagnosis not present

## 2024-01-18 DIAGNOSIS — L281 Prurigo nodularis: Secondary | ICD-10-CM | POA: Diagnosis not present

## 2024-01-18 DIAGNOSIS — T50905A Adverse effect of unspecified drugs, medicaments and biological substances, initial encounter: Secondary | ICD-10-CM | POA: Diagnosis not present

## 2024-01-18 DIAGNOSIS — I772 Rupture of artery: Secondary | ICD-10-CM | POA: Diagnosis not present

## 2024-01-19 ENCOUNTER — Other Ambulatory Visit: Payer: Self-pay

## 2024-01-19 DIAGNOSIS — I872 Venous insufficiency (chronic) (peripheral): Secondary | ICD-10-CM

## 2024-01-19 DIAGNOSIS — I739 Peripheral vascular disease, unspecified: Secondary | ICD-10-CM

## 2024-01-26 ENCOUNTER — Ambulatory Visit (HOSPITAL_COMMUNITY)
Admission: RE | Admit: 2024-01-26 | Discharge: 2024-01-26 | Disposition: A | Discharge status: Home / Self Care | Source: Ambulatory Visit | Attending: Vascular Surgery | Admitting: Vascular Surgery

## 2024-01-26 DIAGNOSIS — I739 Peripheral vascular disease, unspecified: Secondary | ICD-10-CM | POA: Diagnosis not present

## 2024-01-26 DIAGNOSIS — I872 Venous insufficiency (chronic) (peripheral): Secondary | ICD-10-CM | POA: Insufficient documentation

## 2024-01-26 LAB — VAS US ABI WITH/WO TBI
Left ABI: 1.13
Right ABI: 1.44

## 2024-01-26 NOTE — Progress Notes (Signed)
 [name], female   DOB: 06-18-1941, 83 y.o.   MRN: [id number]  Reason for Consult: New Patient (Initial Visit)   Referred by Merna Huxley, NP  Subjective:     HPI  Brooke Washington is a 83 y.o. female presenting for discoloration and wounds of her bilateral feet.  She is accompanied by her husband today.  She reports that she had a drug reaction due to Keflex that caused her to break out a significant rash throughout her back arms and legs.  Since then she has been on prednisone .  She has been dealing with swollen, discolored feet for some time and reports would her feet get really swollen they itch and she scratches to the point of developing scabs.  She also has a superficial ulceration of her right heel that appears to be a popped blister.  She denies any varicose veins.  She does sleep in a recliner due to significant back pain.  She has tried compression stockings but reports that they hurt too much and is not very compliant with them.  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   FRACTURE SURGERY     IR FLUORO GUIDE PORT INSERTION RIGHT  12/07/2017   IR REMOVAL TUN ACCESS W/ PORT W/O FL MOD SED  11/04/2020   IR US  GUIDE VASC ACCESS RIGHT  12/07/2017   ORIF HUMERUS FRACTURE Right 11/23/2017   Procedure: OPEN REDUCTION INTERNAL FIXATION (ORIF) HUMERAL SHAFT FRACTURE;  Surgeon: Kendal Franky SQUIBB, MD;  Location: [facility] OR;  Service: Orthopedics;  Laterality: Right;   RIGHT  OOPHORECTOMY  1976   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   Procedure: TOTAL HIP ARTHROPLASTY ANTERIOR APPROACH LEFT HIP;  Surgeon: Fidel Rogue, MD;  Location: [facility] OR;  Service: Orthopedics;  Laterality: Left;    Short Social History:  Social History   Tobacco Use   Smoking status: Former    Current packs/day: 0.00    Average packs/day: 0.3 packs/day for 51.0 years (12.8 ttl pk-yrs)    Types: Cigarettes    Start date: 03/11/1964    Quit date: 03/12/2015    Years since quitting: 8.8   Smokeless tobacco: Never  Substance Use Topics   Alcohol use: Yes    Alcohol/week: 0.0 standard drinks of alcohol    Comment: 1 glass of wine monthly    Allergies  Allergen Reactions   Contrast Media [Iodinated Contrast Media] Shortness Of Breath, Rash and Other (See Comments)    Throat swells   Iohexol Shortness Of Breath   Povidone-Iodine Hives, Shortness Of Breath and Rash    REACTION: throat swells   Penicillins Hives, Itching and Rash    Has patient had a PCN reaction causing immediate rash, facial/tongue/throat swelling, SOB or lightheadedness with hypotension: Unknown Has patient had a PCN reaction causing severe rash involving mucus membranes or skin necrosis: Yes Has patient had a PCN reaction that  required hospitalization: Unknown Has patient had a PCN reaction occurring within the last 10 years: Unknown If all of the above answers are NO, then may proceed with Cephalosporin use.    Adhesive [Tape] Hives   Valacyclovir  Other (See Comments)    Current Outpatient Medications  Medication Sig Dispense Refill   albuterol  (VENTOLIN  HFA) 108 (90 Base) MCG/ACT inhaler Inhale 2 puffs into the lungs every 6 (six) hours as needed for wheezing or shortness of breath. 1 each 3   aspirin  EC 81 MG tablet Take 81 mg by mouth daily.     BREZTRI  AEROSPHERE 160-9-4.8 MCG/ACT AERO INHALE 2 PUFFS INTO THE LUNGS IN THE MORNING AND AT BEDTIME 10.7 g 6   cholecalciferol  (VITAMIN D ) 1000 units tablet  Take 2,000 Units by mouth daily.     DULoxetine  (CYMBALTA ) 30 MG capsule TAKE 1 CAPSULE BY MOUTH DAILY 90 capsule 1   KRILL OIL PO Take by mouth.     MAGNESIUM  GLYCINATE PO Take 1 tablet by mouth daily.     predniSONE  (DELTASONE ) 10 MG tablet Taper as follows: 4-4-3-3-3-3-2-2-1-1 26 tablet 0   triamcinolone  cream (KENALOG ) 0.1 % Apply topically.     No current facility-administered medications for this visit.    REVIEW OF SYSTEMS   All other systems were reviewed and are negative     Objective:  Objective   Vitals:   01/27/24 1520  BP: 134/64  Pulse: (!) 104  Resp: (!) 22  Temp: 98.7 F (37.1 C)  TempSrc: Temporal  SpO2: 90%  Weight: 121 lb 8 oz (55.1 kg)  Height: 5' 3 (1.6 m)   Body mass index is 21.52 kg/m.  Physical Exam General: no acute distress Cardiac: hemodynamically stable Pulm: normal work of breathing Neuro: alert, no focal deficit Extremities: 2+ edema of bilateral ankles and the dorsum of her feet.  Scattered wounds throughout her ankles and feet, most are dry and scabbed over.  She has venous congestion of bilateral feet and toes.  There is an open weeping superficial ulcer on her right heel.  No varicosities noted. Vascular:   Right: Palpable DP, PT  Left: Palpable DP, PT   Data: +---------+------------------+-----+--------+--------+  Right   Rt Pressure (mmHg)IndexWaveformComment   +---------+------------------+-----+--------+--------+  Brachial 144                                      +---------+------------------+-----+--------+--------+  PTA     214               1.44 biphasic          +---------+------------------+-----+--------+--------+  DP      154               1.03 biphasic          +---------+------------------+-----+--------+--------+  Great Toe112               0.75 Normal            +---------+------------------+-----+--------+--------+   +---------+------------------+-----+--------+-------+  Left     Lt Pressure (mmHg)IndexWaveformComment  +---------+------------------+-----+--------+-------+  Brachial 149                                     +---------+------------------+-----+--------+-------+  PTA     168               1.13 biphasic         +---------+------------------+-----+--------+-------+  DP      169               1.13 biphasic         +---------+------------------+-----+--------+-------+  Great Toe103               0.69 Normal           +---------+------------------+-----+--------+-------+   +-------+-----------+-----------+------------+------------+  ABI/TBIToday's ABIToday's TBIPrevious ABIPrevious TBI  +-------+-----------+-----------+------------+------------+  Right 1.44       0.75                                 +-------+-----------+-----------+------------+------------+  Left  1.13       0.69                                 +-------+-----------+-----------+------------+------------+   Venous reflux +--------------+---------+------+-----------+------------+--------+  RIGHT        Reflux NoRefluxReflux TimeDiameter cmsComments                          Yes                                   +--------------+---------+------+-----------+------------+--------+  CFV                    yes   >1 second                       +--------------+---------+------+-----------+------------+--------+  FV mid                  yes   >1 second                       +--------------+---------+------+-----------+------------+--------+  Popliteal    no                                              +--------------+---------+------+-----------+------------+--------+  GSV at SFJ              yes    >500 ms      0.64              +--------------+---------+------+-----------+------------+--------+  GSV prox thigh          yes    >500 ms      0.28               +--------------+---------+------+-----------+------------+--------+  GSV mid thigh           yes    >500 ms      0.17              +--------------+---------+------+-----------+------------+--------+  GSV dist thighno                            0.26              +--------------+---------+------+-----------+------------+--------+  GSV at knee   no                            0.24              +--------------+---------+------+-----------+------------+--------+  GSV prox calf                                       NV        +--------------+---------+------+-----------+------------+--------+  GSV mid calf                                        NV        +--------------+---------+------+-----------+------------+--------+  SSV Pop Fossa no                            0.47              +--------------+---------+------+-----------+------------+--------+  SSV prox calf no                            0.20              +--------------+---------+------+-----------+------------+--------+  SSV mid calf  no                            0.14              +--------------+---------+------+-----------+------------+--------+      Assessment/Plan:     Brooke Washington is a 83 y.o. female with chronic venous insufficiency with swelling limited to her feet and ankles.  I explained that compression elevation is the mainstay of treatment and I explained that I think her recent worsening of her swelling is related to her sleeping in the recliner as well as her steroid use.  She is going to discuss with her PCP about potentially coming off the prednisone  now that it has been a few weeks since her allergic reaction.  We discussed multiple different options for compression including over-the-counter compression pumps, compression stockings, CircAid's as well as ace wraps.  I also explained that sleeping in the recliner is making her swelling worse and that she needs to be  asleep in the bed or get multiple pillows under her feet at night while in the recliner. She has normal arterial proceed fusion and I explained ultimately these wounds should heal with adequate control of her swelling.  Follow-up as needed     Norman GORMAN Serve MD [facility]

## 2024-01-27 ENCOUNTER — Ambulatory Visit (INDEPENDENT_AMBULATORY_CARE_PROVIDER_SITE_OTHER): Admitting: Vascular Surgery

## 2024-01-27 ENCOUNTER — Encounter: Payer: Self-pay | Admitting: Vascular Surgery

## 2024-01-27 VITALS — BP 134/64 | HR 104 | Temp 98.7°F | Resp 22 | Ht 63.0 in | Wt 121.5 lb

## 2024-01-27 DIAGNOSIS — I872 Venous insufficiency (chronic) (peripheral): Secondary | ICD-10-CM | POA: Diagnosis not present

## 2024-01-27 DIAGNOSIS — M7989 Other specified soft tissue disorders: Secondary | ICD-10-CM

## 2024-02-17 ENCOUNTER — Encounter (HOSPITAL_COMMUNITY)

## 2024-02-17 ENCOUNTER — Encounter: Admitting: Vascular Surgery

## 2024-03-30 ENCOUNTER — Emergency Department (HOSPITAL_BASED_OUTPATIENT_CLINIC_OR_DEPARTMENT_OTHER)

## 2024-03-30 ENCOUNTER — Emergency Department (HOSPITAL_BASED_OUTPATIENT_CLINIC_OR_DEPARTMENT_OTHER)
Admission: EM | Admit: 2024-03-30 | Discharge: 2024-03-30 | Disposition: A | Discharge status: Home / Self Care | Attending: Emergency Medicine | Admitting: Emergency Medicine

## 2024-03-30 ENCOUNTER — Other Ambulatory Visit: Payer: Self-pay

## 2024-03-30 ENCOUNTER — Encounter (HOSPITAL_BASED_OUTPATIENT_CLINIC_OR_DEPARTMENT_OTHER): Payer: Self-pay | Admitting: Emergency Medicine

## 2024-03-30 ENCOUNTER — Encounter: Payer: Self-pay | Admitting: Adult Health

## 2024-03-30 DIAGNOSIS — M533 Sacrococcygeal disorders, not elsewhere classified: Secondary | ICD-10-CM | POA: Diagnosis not present

## 2024-03-30 DIAGNOSIS — S32599A Other specified fracture of unspecified pubis, initial encounter for closed fracture: Secondary | ICD-10-CM | POA: Diagnosis not present

## 2024-03-30 DIAGNOSIS — W19XXXA Unspecified fall, initial encounter: Secondary | ICD-10-CM | POA: Diagnosis not present

## 2024-03-30 DIAGNOSIS — D72829 Elevated white blood cell count, unspecified: Secondary | ICD-10-CM | POA: Diagnosis not present

## 2024-03-30 DIAGNOSIS — S0990XA Unspecified injury of head, initial encounter: Secondary | ICD-10-CM | POA: Diagnosis not present

## 2024-03-30 DIAGNOSIS — S0083XA Contusion of other part of head, initial encounter: Secondary | ICD-10-CM | POA: Insufficient documentation

## 2024-03-30 DIAGNOSIS — M47816 Spondylosis without myelopathy or radiculopathy, lumbar region: Secondary | ICD-10-CM | POA: Diagnosis not present

## 2024-03-30 DIAGNOSIS — S0093XA Contusion of unspecified part of head, initial encounter: Secondary | ICD-10-CM | POA: Diagnosis not present

## 2024-03-30 DIAGNOSIS — Z7982 Long term (current) use of aspirin: Secondary | ICD-10-CM | POA: Diagnosis not present

## 2024-03-30 DIAGNOSIS — R7989 Other specified abnormal findings of blood chemistry: Secondary | ICD-10-CM

## 2024-03-30 DIAGNOSIS — Y9301 Activity, walking, marching and hiking: Secondary | ICD-10-CM | POA: Insufficient documentation

## 2024-03-30 DIAGNOSIS — R778 Other specified abnormalities of plasma proteins: Secondary | ICD-10-CM | POA: Insufficient documentation

## 2024-03-30 DIAGNOSIS — S199XXA Unspecified injury of neck, initial encounter: Secondary | ICD-10-CM | POA: Diagnosis not present

## 2024-03-30 DIAGNOSIS — S0003XA Contusion of scalp, initial encounter: Secondary | ICD-10-CM | POA: Diagnosis not present

## 2024-03-30 DIAGNOSIS — R509 Fever, unspecified: Secondary | ICD-10-CM | POA: Diagnosis not present

## 2024-03-30 DIAGNOSIS — Z79899 Other long term (current) drug therapy: Secondary | ICD-10-CM | POA: Diagnosis not present

## 2024-03-30 DIAGNOSIS — R000 Tachycardia, unspecified: Secondary | ICD-10-CM | POA: Diagnosis not present

## 2024-03-30 DIAGNOSIS — I6782 Cerebral ischemia: Secondary | ICD-10-CM | POA: Diagnosis not present

## 2024-03-30 DIAGNOSIS — T148XXA Other injury of unspecified body region, initial encounter: Secondary | ICD-10-CM

## 2024-03-30 DIAGNOSIS — R0902 Hypoxemia: Secondary | ICD-10-CM | POA: Diagnosis not present

## 2024-03-30 DIAGNOSIS — R9431 Abnormal electrocardiogram [ECG] [EKG]: Secondary | ICD-10-CM | POA: Diagnosis not present

## 2024-03-30 DIAGNOSIS — Z96642 Presence of left artificial hip joint: Secondary | ICD-10-CM | POA: Diagnosis not present

## 2024-03-30 LAB — CBC WITH DIFFERENTIAL/PLATELET
Abs Immature Granulocytes: 0.08 10*3/uL — ABNORMAL HIGH (ref 0.00–0.07)
Basophils Absolute: 0 10*3/uL (ref 0.0–0.1)
Basophils Relative: 0 %
Eosinophils Absolute: 0.4 10*3/uL (ref 0.0–0.5)
Eosinophils Relative: 3 %
HCT: 40.9 % (ref 36.0–46.0)
Hemoglobin: 13.2 g/dL (ref 12.0–15.0)
Immature Granulocytes: 1 %
Lymphocytes Relative: 5 %
Lymphs Abs: 0.7 10*3/uL (ref 0.7–4.0)
MCH: 32.1 pg (ref 26.0–34.0)
MCHC: 32.3 g/dL (ref 30.0–36.0)
MCV: 99.5 fL (ref 80.0–100.0)
Monocytes Absolute: 1.5 10*3/uL — ABNORMAL HIGH (ref 0.1–1.0)
Monocytes Relative: 11 %
Neutro Abs: 11.4 10*3/uL — ABNORMAL HIGH (ref 1.7–7.7)
Neutrophils Relative %: 80 %
Platelets: 216 10*3/uL (ref 150–400)
RBC: 4.11 MIL/uL (ref 3.87–5.11)
RDW: 14.6 % (ref 11.5–15.5)
WBC: 14.1 10*3/uL — ABNORMAL HIGH (ref 4.0–10.5)
nRBC: 0 % (ref 0.0–0.2)

## 2024-03-30 LAB — URINALYSIS, ROUTINE W REFLEX MICROSCOPIC
Bilirubin Urine: NEGATIVE
Glucose, UA: NEGATIVE mg/dL
Hgb urine dipstick: NEGATIVE
Ketones, ur: NEGATIVE mg/dL
Leukocytes,Ua: NEGATIVE
Nitrite: NEGATIVE
Protein, ur: NEGATIVE mg/dL
Specific Gravity, Urine: 1.015 (ref 1.005–1.030)
pH: 5.5 (ref 5.0–8.0)

## 2024-03-30 LAB — CK: Total CK: 113 U/L (ref 38–234)

## 2024-03-30 LAB — COMPREHENSIVE METABOLIC PANEL WITH GFR
ALT: 13 U/L (ref 0–44)
AST: 21 U/L (ref 15–41)
Albumin: 3.9 g/dL (ref 3.5–5.0)
Alkaline Phosphatase: 108 U/L (ref 38–126)
Anion gap: 12 (ref 5–15)
BUN: 17 mg/dL (ref 8–23)
CO2: 27 mmol/L (ref 22–32)
Calcium: 9.3 mg/dL (ref 8.9–10.3)
Chloride: 102 mmol/L (ref 98–111)
Creatinine, Ser: 0.96 mg/dL (ref 0.44–1.00)
GFR, Estimated: 58 mL/min — ABNORMAL LOW (ref >60)
Glucose, Bld: 106 mg/dL — ABNORMAL HIGH (ref 70–99)
Potassium: 4 mmol/L (ref 3.5–5.1)
Sodium: 141 mmol/L (ref 135–145)
Total Bilirubin: 0.7 mg/dL (ref 0.0–1.2)
Total Protein: 5.9 g/dL — ABNORMAL LOW (ref 6.5–8.1)

## 2024-03-30 LAB — TROPONIN T, HIGH SENSITIVITY
Troponin T High Sensitivity: 68 ng/L — ABNORMAL HIGH (ref <19)
Troponin T High Sensitivity: 64 ng/L — ABNORMAL HIGH (ref <19)

## 2024-03-30 NOTE — Telephone Encounter (Signed)
 Spoke to pt spouse and advised that pt needs to be evaluated in person. Spouse stated that he is unable to transport pt to office. After speaking with Randel Buss he advised that pt be transported to ED due to hitting head and being on Aspirin . Pt spouse notified of update and verbalized understanding.

## 2024-03-30 NOTE — ED Provider Notes (Cosign Needed)
 [place] [facility] [place] Provider Note   CSN: [id number] Arrival date & time: 03/30/24  4098     Patient presents with: No chief complaint on file.   Brooke Washington is a 83 y.o. female with history of emphysema, hyperlipidemia, lymphoma, presents with concern for a fall earlier today.  States she was walking to the restroom when she lost her balance and fell.  Denies any dizziness, chest pain, shortness of breath, or other symptoms before the fall.  She is unsure if she hit her head, but does note a small bruise to the back of the head.  She reports pain to her tailbone.  Denies pain elsewhere.   HPI     Prior to Admission medications   Medication Sig Start Date End Date Taking? Authorizing Provider  albuterol  (VENTOLIN  HFA) 108 (90 Base) MCG/ACT inhaler Inhale 2 puffs into the lungs every 6 (six) hours as needed for wheezing or shortness of breath. 08/03/22   Nafziger, Randel Buss, NP  aspirin  EC 81 MG tablet Take 81 mg by mouth daily.    [provider]  BREZTRI  AEROSPHERE 160-9-4.8 MCG/ACT AERO INHALE 2 PUFFS INTO THE LUNGS IN THE MORNING AND AT BEDTIME 10/19/23   Nafziger, Randel Buss, NP  cholecalciferol  (VITAMIN D ) 1000 units tablet Take 2,000 Units by mouth daily.    [provider]  DULoxetine  (CYMBALTA ) 30 MG capsule TAKE 1 CAPSULE BY MOUTH DAILY 01/03/24   Vaslow, Zachary K, MD  KRILL OIL PO Take by mouth.    [provider]  MAGNESIUM  GLYCINATE PO Take 1 tablet by mouth daily.    [provider]  predniSONE  (DELTASONE ) 10 MG tablet Taper as follows: 4-4-3-3-3-3-2-2-1-1 01/13/24   Burchette, Marijean Shouts, MD  triamcinolone  cream (KENALOG ) 0.1 % Apply topically.    [provider]    Allergies: Contrast media [iodinated contrast media], Iohexol, Povidone-iodine, Penicillins, Adhesive [tape], Keflex [cephalexin], and Valacyclovir     Review of Systems  Musculoskeletal:        Tailbone pain    Updated Vital Signs BP  132/69   Pulse 100   Temp 98 F (36.7 C) (Oral)   Resp 20   Ht 5' 4 (1.626 m)   Wt 54.9 kg   LMP  (LMP Unknown)   SpO2 97%   BMI 20.77 kg/m   Physical Exam Vitals and nursing note reviewed.  Constitutional:      General: She is not in acute distress.    Appearance: She is well-developed.  HENT:     Head: Normocephalic and atraumatic.     Comments: No skull tenderness palpation diffusely No Battle sign or raccoon eyes  Eyes:     Extraocular Movements: Extraocular movements intact.     Conjunctiva/sclera: Conjunctivae normal.    Cardiovascular:     Rate and Rhythm: Normal rate and regular rhythm.     Heart sounds: No murmur heard.    Comments: Radial pulses and pedal pulses 2+ bilaterally Pulmonary:     Effort: Pulmonary effort is normal. No respiratory distress.     Breath sounds: Normal breath sounds.     Comments: On baseline 3 L nasal cannula Abdominal:     Palpations: Abdomen is soft.     Tenderness: There is no abdominal tenderness.   Musculoskeletal:        General: No swelling. Normal range of motion.     Cervical back: Neck supple.     Right lower leg: No edema.  Left lower leg: No edema.     Comments: General No obvious deformity.  Large ecchymoses to the anterior aspect of the right forearm.  Ecchymoses to the lower legs bilaterally.  No open wounds  Palpation Non-tender to palpation of the clavicles,humerus, radius and ulna, carpal bones, 1st-5th metacarpals and phalanges bilaterally Non tender over the femur, patella, tibia or fibula bilaterally  Non-tender over the cervical, thoracic, or lumbar spinous processes. Non-tender to palpation of the paraspinal region of the back or chest wall diffusely  Tender to palpation over the coccyx  No tenderness of the pelvis diffusely  ROM Full ROM of shoulders bilaterally Full elbow, wrist, knee flexion and extension bilaterally Intact plantarflexion and dorsiflexion, hip flexion  bilaterally  Sensation: Sensation intact throughout the bilateral upper and lower extremity  Strength: 5/5 strength with resisted elbow and wrist flexion and extension bilaterally 5/5 strength with resisted knee flexion and extension and ankle plantarflexion and dorsiflexion bilaterally     Skin:    General: Skin is warm and dry.     Capillary Refill: Capillary refill takes less than 2 seconds.   Neurological:     General: No focal deficit present.     Mental Status: She is alert and oriented to person, place, and time.   Psychiatric:        Mood and Affect: Mood normal.     (all labs ordered are listed, but only abnormal results are displayed) Labs Reviewed  CBC WITH DIFFERENTIAL/PLATELET - Abnormal; Notable for the following components:      Result Value   WBC 14.1 (*)    Neutro Abs 11.4 (*)    Monocytes Absolute 1.5 (*)    Abs Immature Granulocytes 0.08 (*)    All other components within normal limits  COMPREHENSIVE METABOLIC PANEL WITH GFR - Abnormal; Notable for the following components:   Glucose, Bld 106 (*)    Total Protein 5.9 (*)    GFR, Estimated 58 (*)    All other components within normal limits  TROPONIN T, HIGH SENSITIVITY - Abnormal; Notable for the following components:   Troponin T High Sensitivity 68 (*)    All other components within normal limits  TROPONIN T, HIGH SENSITIVITY - Abnormal; Notable for the following components:   Troponin T High Sensitivity 64 (*)    All other components within normal limits  CK  URINALYSIS, ROUTINE W REFLEX MICROSCOPIC    EKG: EKG Interpretation Date/Time:  Friday March 30 2024 10:36:33 EDT Ventricular Rate:  98 PR Interval:  143 QRS Duration:  90 QT Interval:  372 QTC Calculation: 475 R Axis:   270  Text Interpretation: Sinus rhythm Ventricular premature complex Left anterior fascicular block Low voltage, precordial leads Consider right ventricular hypertrophy Consider anterior infarct No significant change  since last tracing Confirmed by Celesta Coke ([id number]) on 03/30/2024 10:45:34 AM  Radiology: Lenell Query Sacrum/Coccyx Result Date: 03/30/2024 CLINICAL DATA:  Fall with sacral pain EXAM: SACRUM AND COCCYX - 2+ VIEW COMPARISON:  CT 10/20/2020 FINDINGS: Left hip replacement. Chronic sclerotic foci in the right ischium and femoral trochanter. Mild SI joint degenerative change. No definite acute displaced fracture is seen. Old right pubic rami fractures. IMPRESSION: 1. No definite acute osseous abnormality. Old right pubic rami fractures. Cross-sectional imaging follow-up may be performed if high suspicion for fracture 2. Left hip replacement. Electronically Signed   By: Esmeralda Hedge M.D.   On: 03/30/2024 15:18   DG Pelvis 1-2 Views Result Date: 03/30/2024 CLINICAL DATA:  Fall with  pain EXAM: PELVIS - 1-2 VIEW COMPARISON:  CT 10/20/2020 FINDINGS: SI joints are non widened. Pubic symphysis appears intact. Remote right superior and inferior pubic rami fractures. Chronic sclerotic lesions within the right ischium and femoral trochanter, presumed benign given stability since 2022. Left hip replacement with intact hardware and normal alignment. IMPRESSION: No acute osseous abnormality. Remote right superior and inferior pubic rami fractures. Left hip replacement. Electronically Signed   By: Esmeralda Hedge M.D.   On: 03/30/2024 15:17   CT Head Wo Contrast Result Date: 03/30/2024 CLINICAL DATA:  Head trauma, neck trauma, lost balance and fell. Bruising to back of head. EXAM: CT HEAD WITHOUT CONTRAST CT CERVICAL SPINE WITHOUT CONTRAST TECHNIQUE: Multidetector CT imaging of the head and cervical spine was performed following the standard protocol without intravenous contrast. Multiplanar CT image reconstructions of the cervical spine were also generated. RADIATION DOSE REDUCTION: This exam was performed according to the departmental dose-optimization program which includes automated exposure control, adjustment of the mA  and/or kV according to patient size and/or use of iterative reconstruction technique. COMPARISON:  CT head 09/30/2021. FINDINGS: CT HEAD FINDINGS Brain: No acute intracranial hemorrhage. No CT evidence of acute infarct. 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 Nonspecific hypoattenuation in the periventricular and subcortical white matter favored to reflect chronic microvascular ischemic changes. Generalized parenchymal volume loss. No edema, mass effect, or midline shift. The basilar cisterns are patent. Ventricles: The ventricles are normal. Vascular: No hyperdense vessel or unexpected calcification. Skull: No acute or aggressive finding. Orbits: Bilateral lens replacement. Sinuses: The visualized paranasal sinuses are clear. Other: Mastoid air cells are clear. CT CERVICAL SPINE FINDINGS Alignment: Straightening of the normal cervical lordosis. 2 mm anterolisthesis of C7 on T1 likely related to chronic facet degenerative changes. No facet dislocation. Skull base and vertebrae: No acute fracture. No primary bone lesion or focal pathologic process. Soft tissues and spinal canal: No prevertebral fluid or swelling. No visible canal hematoma. Disc levels: Severe intervertebral disc space narrowing at multiple levels particularly at C5-6 and C6-7 with additional moderate to severe disc space narrowing at C4-5. Disc osteophyte complexes at multiple levels without high-grade osseous spinal canal stenosis. Facet arthrosis and uncovertebral hypertrophy throughout the cervical spine. Significant foraminal narrowing at multiple levels particularly bilaterally at C3-4 and C4-5 and on the left at C5-6 and bilaterally at C6-7. Upper chest: Emphysema in the lung apices. Other: None. IMPRESSION: No CT evidence of acute intracranial abnormality. No acute fracture or traumatic malalignment of the cervical spine. Extensive chronic microvascular ischemic changes. Generalized parenchymal volume loss. Remote lacunar infarcts in  the bilateral thalami. Degenerative changes as above. Electronically Signed   By: Denny Flack M.D.   On: 03/30/2024 11:56   CT Cervical Spine Wo Contrast Result Date: 03/30/2024 CLINICAL DATA:  Head trauma, neck trauma, lost balance and fell. Bruising to back of head. EXAM: CT HEAD WITHOUT CONTRAST CT CERVICAL SPINE WITHOUT CONTRAST TECHNIQUE: Multidetector CT imaging of the head and cervical spine was performed following the standard protocol without intravenous contrast. Multiplanar CT image reconstructions of the cervical spine were also generated. RADIATION DOSE REDUCTION: This exam was performed according to the departmental dose-optimization program which includes automated exposure control, adjustment of the mA and/or kV according to patient size and/or use of iterative reconstruction technique. COMPARISON:  CT head 09/30/2021. FINDINGS: CT HEAD FINDINGS Brain: No acute intracranial hemorrhage. No CT evidence of acute infarct. 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 Nonspecific hypoattenuation in the periventricular and subcortical white matter  favored to reflect chronic microvascular ischemic changes. Generalized parenchymal volume loss. No edema, mass effect, or midline shift. The basilar cisterns are patent. Ventricles: The ventricles are normal. Vascular: No hyperdense vessel or unexpected calcification. Skull: No acute or aggressive finding. Orbits: Bilateral lens replacement. Sinuses: The visualized paranasal sinuses are clear. Other: Mastoid air cells are clear. CT CERVICAL SPINE FINDINGS Alignment: Straightening of the normal cervical lordosis. 2 mm anterolisthesis of C7 on T1 likely related to chronic facet degenerative changes. No facet dislocation. Skull base and vertebrae: No acute fracture. No primary bone lesion or focal pathologic process. Soft tissues and spinal canal: No prevertebral fluid or swelling. No visible canal hematoma. Disc levels: Severe intervertebral disc space  narrowing at multiple levels particularly at C5-6 and C6-7 with additional moderate to severe disc space narrowing at C4-5. Disc osteophyte complexes at multiple levels without high-grade osseous spinal canal stenosis. Facet arthrosis and uncovertebral hypertrophy throughout the cervical spine. Significant foraminal narrowing at multiple levels particularly bilaterally at C3-4 and C4-5 and on the left at C5-6 and bilaterally at C6-7. Upper chest: Emphysema in the lung apices. Other: None. IMPRESSION: No CT evidence of acute intracranial abnormality. No acute fracture or traumatic malalignment of the cervical spine. Extensive chronic microvascular ischemic changes. Generalized parenchymal volume loss. 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 Degenerative changes as above. Electronically Signed   By: Denny Flack M.D.   On: 03/30/2024 11:56     Procedures   Medications Ordered in the ED - No data to display                                  Medical Decision Making Amount and/or Complexity of Data Reviewed Labs: ordered. Radiology: ordered.     Differential diagnosis includes but is not limited to mechanical fall, ACS, arrhythmia, electrolyte abnormality, anemia, seizure, fracture, dislocation, UTI, intracranial hemorrhage  ED Course:  Upon initial evaluation, patient is well-appearing, no acute distress.  Stable vitals.  She is mildly tender to palpation of the coccyx region of the lower back.  No tenderness to palpation of the cervical, thoracic, or lumbar spine.  No tenderness palpation elsewhere of the upper or lower extremities bilaterally.  Full range of motion of the upper and lower extremities bilaterally.  No neurodeficits.  Will proceed with labs, EKG, CT head and cervical spine, and x-ray of the pelvis and coccyx.  Labs Ordered: I Ordered, and personally interpreted labs.  The pertinent results include:   CBC with leukocytosis of 14.1 CMP with normal electrolytes aside  from mildly elevated glucose at 106.  No elevation in LFTs or creatinine CK within normal limits Urinalysis without signs of infection Initial troponin of 68, repeat 64  Imaging Studies ordered: I ordered imaging studies including CT head, CT cervical spine, x-ray pelvis, x-ray sacrum/coccyx I independently visualized the imaging with scope of interpretation limited to determining acute life threatening conditions related to emergency care.  CT head and cervical spine without acute abnormality X-ray of pelvis and sacrum/coccyx without acute abnormality.  Old right sided pelvic rami fracture noted I agree with the radiologist interpretation   Cardiac Monitoring: / EKG: The patient was maintained on a cardiac monitor.  I personally viewed and interpreted the cardiac monitored which showed an underlying rhythm of: Normal sinus rhythm with no ST changes   Medications Given: None  Upon re-evaluation, patient remains well-appearing with stable vitals.  Her troponin was obtained  by triage and is elevated, but remains stable.  EKG with normal sinus rhythm without any ST elevations.  She is denying any chest pain, shortness of breath, abdominal pain, diaphoresis.  Low concern for ACS at this time. Lower concern for PE at this time given no chest pain, shortness of breath, tachycardia, no history of a blood clot, no recent periods of immobilization.  Labs overall unremarkable without any signs of anemia, electrolyte abnormality.  Urinalysis without signs of infection.  Story and workup most concerning for mechanical fall. CK normal, was not on the floor for long after the fall, no concern for rhabdomyolysis.  CT head and cervical spine unremarkable.  X-ray of the pelvis and coccyx without acute abnormality.  Suspect she may have a bone bruise of the area given her tenderness over the tailbone.  She was able to ambulate without difficulty here. I reviewed patient with my attending Dr. Nora Beal. Although  troponin elevated, given she is not having any chest pain, shortness of breath, abdominal pain, diaphoresis, or other symptoms, Dr. Nora Beal does not feel she needs any further workup at this time.   Stable and appropriate for discharge home  Impression: Mechanical fall Bone bruise  Disposition:  The patient was discharged home with instructions to take Tylenol  and ibuprofen as needed for pain.  Follow-up with PCP within the next week elevated troponin here today. Return precautions given.   This chart was dictated using voice recognition software, Dragon. Despite the best efforts of this provider to proofread and correct errors, errors may still occur which can change documentation meaning.       Final diagnoses:  Fall, initial encounter  Elevated troponin  Bone bruise    ED Discharge Orders     None          Rexie Catena, [place] 03/30/24 1722

## 2024-03-30 NOTE — Discharge Instructions (Addendum)
 Your workup is reassuring today.  The x-ray of your pelvis and tailbone does not show any fracture.  You did have a fracture noted in the right pelvis which is healed.  I have included your x-ray results below for reference.  The CT of your head and cervical spine does not show any injuries such as a brain bleed or skull fracture.  Your electrolytes were normal today.  Your blood counts were normal aside from a mildly elevated white blood cell count.  Your CK is normal today, no signs of severe muscle breakdown.  Your urine did not show any signs of infection.  Your troponin which is your heart test was abnormal today.  However, the value is not changing, which means there is low concern for any heart attack. Your EKG (heart's electrical activity and rhythm) did not show any abnormal rhythms or heart attack.   Please follow up with your PCP within the next week for further monitoring of your troponin (heart test).   You may take up to 1000mg  of tylenol  every 6 hours as needed for pain.  Do not take more then 4g per day.  You may use up to 600mg  ibuprofen every 6 hours as needed for pain.  Do not exceed 2.4g of ibuprofen per day.  If you would like to alternate these medications, you may do as as below: Take 600mg  ibuprofen, then 3 hours later take 1000mg  tylenol , then 3 hours later 600mg  ibuprofen, then 3 hours later 1000mg  tylenol , so on and so forth for pain control.   Return to the ER if you develop any chest pain, shortness of breath, loss of consciousness, any other new or concerning symptoms  EXAM: SACRUM AND COCCYX - 2+ VIEW   COMPARISON:  CT 10/20/2020   FINDINGS: Left hip replacement. Chronic sclerotic foci in the right ischium and femoral trochanter. Mild SI joint degenerative change. No definite acute displaced fracture is seen. Old right pubic rami fractures.   IMPRESSION: 1. No definite acute osseous abnormality. Old right pubic rami fractures. Cross-sectional imaging  follow-up may be performed if high suspicion for fracture 2. Left hip replacement.     Electronically Signed   By: Esmeralda Hedge M.D.   On: 03/30/2024 15:18

## 2024-03-30 NOTE — ED Triage Notes (Addendum)
 States lost her balance in the BR and fell , states she fell on wed also  has small bruise on back of head , unsure if that is from todays fall or Wednesday fall,rt forearm ( from [facility]) has bruises on sacrum  again unsure of which fall this is from,, states unsure if she hit her head pt is AAO x4 states is on and off O2 at 3 LNC  and has been using an inhaler, pts O2 sat on arrival ON RA is 84 ..pt placed on 3 L [place]  pt states also uses a walker off and on denies dizziness  before fall husband in room with pt

## 2024-03-30 NOTE — ED Notes (Signed)
 Pt assisted onto bedpan.

## 2024-04-02 ENCOUNTER — Telehealth: Payer: Self-pay

## 2024-04-02 NOTE — Transitions of Care (Post Inpatient/ED Visit) (Signed)
   04/02/2024  Name: Brooke Washington MRN: [id number] DOB: 1941-07-14  Today's TOC FU Call Status: Today's TOC FU Call Status:: Unsuccessful Call (1st Attempt) Unsuccessful Call (1st Attempt) Date: 04/02/24  Attempted to reach the patient regarding the most recent Inpatient/ED visit.  Follow Up Plan: Additional outreach attempts will be made to reach the patient to complete the Transitions of Care (Post Inpatient/ED visit) call.   Signature : Khadeem Rockett, CMA

## 2024-04-03 ENCOUNTER — Other Ambulatory Visit: Payer: Self-pay | Admitting: Adult Health

## 2024-04-03 ENCOUNTER — Ambulatory Visit: Payer: Self-pay | Admitting: Adult Health

## 2024-04-03 DIAGNOSIS — J449 Chronic obstructive pulmonary disease, unspecified: Secondary | ICD-10-CM

## 2024-04-03 NOTE — Telephone Encounter (Unsigned)
 Copied from CRM [id number]. Topic: General - Call Back - No Documentation >> Apr 02, 2024  5:10 PM DeAngela L wrote: Reason for CRM: Patient returning call for the office to answer questions left by the office about the ED room visit the patient had on 03/30/24, patient had a fall and lots of bruises and she is really sore  Pt num [phone] (M)

## 2024-04-03 NOTE — Telephone Encounter (Signed)
 FYI Only or Action Required?: Action required by provider: request for appointment.  Patient is followed in Pulmonology for COPD, last seen on 07/30/2021 by Washington, Brooke RAMAN, NP. Called Nurse Triage reporting Shortness of Breath. Symptoms began several days ago. Interventions attempted: Rescue inhaler and Maintenance inhaler. Symptoms are: rapidly worsening.  Triage Disposition: Go to ED or PCP/Alternative with Approval  Patient/caregiver understands and will follow disposition?:   Copied from CRM [id number]. Topic: Clinical - Red Word Triage >> Apr 03, 2024  2:07 PM Brooke Washington wrote: Red Word that prompted transfer to Nurse Triage: Patient's spouse Brooke Washington [phone] toke patient to [facility] hospital last week 03/30/24 because she fell twice last week, one of the fall she hit her head. Patient's pcp NP, Darleene Shape today and was advised to see pulmonologist for COPD continual care issues. Patient oxygen  level has been dropping 66-70 and grasping for breath when without oxygen  machine, patient has to be on oxygen  machine almost all the time. Patient last saw NP, Brooke Washington 07/30/21. Please advise. Reason for Disposition  Patient sounds very sick or weak to the triager  Answer Assessment - Initial Assessment Questions 1. RESPIRATORY STATUS: Describe your breathing? (e.g., wheezing, shortness of breath, unable to speak, severe coughing)      Shortness of breath-gasping for oxygen  if she is off the oxygen  2. ONSET: When did this breathing problem begin?      Husband states that patient has had a sudden downhill with the fall. Husband states that patient has needed to be on the oxygen  concentrator 100% of the time since the fall.  3. PATTERN Does the difficult breathing come and go, or has it been constant since it started?      Constant.  4. SEVERITY: How bad is your breathing? (e.g., mild, moderate, severe)    - MILD: No SOB at rest, mild SOB with walking, speaks normally in  sentences, can lie down, no retractions, pulse < 100.    - MODERATE: SOB at rest, SOB with minimal exertion and prefers to sit, cannot lie down flat, speaks in phrases, mild retractions, audible wheezing, pulse 100-120.    - SEVERE: Very SOB at rest, speaks in single words, struggling to breathe, sitting hunched forward, retractions, pulse > 120      Husband denies any shortness of breath as long as patient is on oxygen .  5. RECURRENT SYMPTOM: Have you had difficulty breathing before? If Yes, ask: When was the last time? and What happened that time?      no 6. CARDIAC HISTORY: Do you have any history of heart disease? (e.g., heart attack, angina, bypass surgery, angioplasty)      no 7. LUNG HISTORY: Do you have any history of lung disease?  (e.g., pulmonary embolus, asthma, emphysema)     COPD 8. CAUSE: What do you think is causing the breathing problem?      unsure 9. OTHER SYMPTOMS: Do you have any other symptoms? (e.g., dizziness, runny nose, cough, chest pain, fever)     no 10. O2 SATURATION MONITOR:  Do you use an oxygen  saturation monitor (pulse oximeter) at home? If Yes, ask: What is your reading (oxygen  level) today? What is your usual oxygen  saturation reading? (e.g., 95%)       96%-husband unsure if this is completely accurate.  12. TRAVEL: Have you traveled out of the country in the last month? (e.g., travel history, exposures)       no  Patient's husband states patient has been  having increased need for oxygen  since falling on 03/30/2024. Extreme shortness of breath with activity.  Husband states this has been a large difference-endorses patient was able to do daily chores around the house without needing her oxygen  prior to fall. Post fall, patient has needed the oxygen  concentrator at all times. Husband reports oxygen  levels have been dropping to 66-70% without the oxygen  during ambulation. Husband checked oxygen  levels while sitting 88% without oxygen . Husband  states patient has been quite weak. Patient last saw pulmonary on 07/30/2021-saw Brooke parrett NP. Patient recommended to Emergency Department. Husband is highly encouraged to call 911 to help get patient to the Emergency Department. Husband is agreeable and states he will call back if he has any more questions.  Protocols used: Breathing Difficulty-A-AH

## 2024-04-03 NOTE — Telephone Encounter (Signed)
 Pt states she went to the ED  last week she is stable at home now and just needs an appt with pulm, she saw her PCP this morning and he recommends an appt with Pulm will fwd to front desk for scheduling [phone] [facility]

## 2024-04-04 ENCOUNTER — Other Ambulatory Visit: Payer: Self-pay

## 2024-04-04 ENCOUNTER — Emergency Department (HOSPITAL_BASED_OUTPATIENT_CLINIC_OR_DEPARTMENT_OTHER)

## 2024-04-04 ENCOUNTER — Emergency Department (HOSPITAL_BASED_OUTPATIENT_CLINIC_OR_DEPARTMENT_OTHER)
Admission: EM | Admit: 2024-04-04 | Discharge: 2024-04-04 | Disposition: A | Discharge status: Home / Self Care | Attending: Emergency Medicine | Admitting: Emergency Medicine

## 2024-04-04 ENCOUNTER — Encounter (HOSPITAL_BASED_OUTPATIENT_CLINIC_OR_DEPARTMENT_OTHER): Payer: Self-pay | Admitting: Emergency Medicine

## 2024-04-04 DIAGNOSIS — Z7951 Long term (current) use of inhaled steroids: Secondary | ICD-10-CM | POA: Insufficient documentation

## 2024-04-04 DIAGNOSIS — J449 Chronic obstructive pulmonary disease, unspecified: Secondary | ICD-10-CM | POA: Insufficient documentation

## 2024-04-04 DIAGNOSIS — W19XXXD Unspecified fall, subsequent encounter: Secondary | ICD-10-CM | POA: Diagnosis not present

## 2024-04-04 DIAGNOSIS — Z7982 Long term (current) use of aspirin: Secondary | ICD-10-CM | POA: Diagnosis not present

## 2024-04-04 DIAGNOSIS — R0602 Shortness of breath: Secondary | ICD-10-CM | POA: Insufficient documentation

## 2024-04-04 DIAGNOSIS — S300XXA Contusion of lower back and pelvis, initial encounter: Secondary | ICD-10-CM | POA: Diagnosis not present

## 2024-04-04 DIAGNOSIS — S27329A Contusion of lung, unspecified, initial encounter: Secondary | ICD-10-CM | POA: Diagnosis not present

## 2024-04-04 DIAGNOSIS — S3992XA Unspecified injury of lower back, initial encounter: Secondary | ICD-10-CM | POA: Diagnosis present

## 2024-04-04 DIAGNOSIS — M533 Sacrococcygeal disorders, not elsewhere classified: Secondary | ICD-10-CM | POA: Diagnosis not present

## 2024-04-04 DIAGNOSIS — I700 Atherosclerosis of aorta: Secondary | ICD-10-CM | POA: Diagnosis not present

## 2024-04-04 DIAGNOSIS — K7689 Other specified diseases of liver: Secondary | ICD-10-CM | POA: Diagnosis not present

## 2024-04-04 LAB — COMPREHENSIVE METABOLIC PANEL WITH GFR
ALT: 15 U/L (ref 0–44)
AST: 19 U/L (ref 15–41)
Albumin: 4.2 g/dL (ref 3.5–5.0)
Alkaline Phosphatase: 131 U/L — ABNORMAL HIGH (ref 38–126)
Anion gap: 13 (ref 5–15)
BUN: 17 mg/dL (ref 8–23)
CO2: 28 mmol/L (ref 22–32)
Calcium: 10.3 mg/dL (ref 8.9–10.3)
Chloride: 105 mmol/L (ref 98–111)
Creatinine, Ser: 0.86 mg/dL (ref 0.44–1.00)
GFR, Estimated: >60 mL/min (ref >60)
Glucose, Bld: 94 mg/dL (ref 70–99)
Potassium: 3.8 mmol/L (ref 3.5–5.1)
Sodium: 145 mmol/L (ref 135–145)
Total Bilirubin: 0.4 mg/dL (ref 0.0–1.2)
Total Protein: 6.9 g/dL (ref 6.5–8.1)

## 2024-04-04 LAB — CBC WITH DIFFERENTIAL/PLATELET
Abs Immature Granulocytes: 0.06 10*3/uL (ref 0.00–0.07)
Basophils Absolute: 0 10*3/uL (ref 0.0–0.1)
Basophils Relative: 1 %
Eosinophils Absolute: 0.5 10*3/uL (ref 0.0–0.5)
Eosinophils Relative: 7 %
HCT: 42.9 % (ref 36.0–46.0)
Hemoglobin: 13.6 g/dL (ref 12.0–15.0)
Immature Granulocytes: 1 %
Lymphocytes Relative: 11 %
Lymphs Abs: 0.8 10*3/uL (ref 0.7–4.0)
MCH: 31.6 pg (ref 26.0–34.0)
MCHC: 31.7 g/dL (ref 30.0–36.0)
MCV: 99.5 fL (ref 80.0–100.0)
Monocytes Absolute: 0.7 10*3/uL (ref 0.1–1.0)
Monocytes Relative: 10 %
Neutro Abs: 5 10*3/uL (ref 1.7–7.7)
Neutrophils Relative %: 70 %
Platelets: 305 10*3/uL (ref 150–400)
RBC: 4.31 MIL/uL (ref 3.87–5.11)
RDW: 14.3 % (ref 11.5–15.5)
WBC: 7.1 10*3/uL (ref 4.0–10.5)
nRBC: 0 % (ref 0.0–0.2)

## 2024-04-04 LAB — PRO BRAIN NATRIURETIC PEPTIDE: Pro Brain Natriuretic Peptide: 1559 pg/mL — ABNORMAL HIGH (ref <300.0)

## 2024-04-04 NOTE — ED Notes (Signed)
 RT Note: Patient had to be placed on 3lpm Roy to maintain an oxygen  saturation 95% .   Her oxygen  saturation on room air was 79%

## 2024-04-04 NOTE — ED Provider Notes (Signed)
 [place] EMERGENCY DEPARTMENT AT [facility] Provider Note   CSN: [id number] Arrival date & time: 04/04/24  9076     Patient presents with: Shortness of Breath   Brooke Washington is a 83 y.o. female.   The history is provided by the patient and medical records. No language interpreter was used.  Shortness of Breath Severity:  Severe Onset quality:  Gradual Duration:  1 week Timing:  Sporadic Progression:  Waxing and waning Chronicity: when off oxygen . Relieved by:  Oxygen  Worsened by:  Nothing Ineffective treatments:  None tried Associated symptoms: no abdominal pain, no chest pain, no cough, no fever, no headaches, no hemoptysis, no neck pain, no rash, no vomiting and no wheezing   Associated symptoms comment:  Back pain      Prior to Admission medications   Medication Sig Start Date End Date Taking? Authorizing Provider  albuterol  (VENTOLIN  HFA) 108 (90 Base) MCG/ACT inhaler Inhale 2 puffs into the lungs every 6 (six) hours as needed for wheezing or shortness of breath. 08/03/22   Nafziger, Darleene, NP  aspirin  EC 81 MG tablet Take 81 mg by mouth daily.    [provider]  BREZTRI  AEROSPHERE 160-9-4.8 MCG/ACT AERO INHALE 2 PUFFS INTO THE LUNGS IN THE MORNING AND AT BEDTIME 10/19/23   Nafziger, Darleene, NP  cholecalciferol  (VITAMIN D ) 1000 units tablet Take 2,000 Units by mouth daily.    [provider]  DULoxetine  (CYMBALTA ) 30 MG capsule TAKE 1 CAPSULE BY MOUTH DAILY 01/03/24   Vaslow, Zachary K, MD  KRILL OIL PO Take by mouth.    [provider]  MAGNESIUM  GLYCINATE PO Take 1 tablet by mouth daily.    [provider]  predniSONE  (DELTASONE ) 10 MG tablet Taper as follows: 4-4-3-3-3-3-2-2-1-1 01/13/24   Burchette, Wolm ORN, MD  triamcinolone  cream (KENALOG ) 0.1 % Apply topically.    [provider]    Allergies: Contrast media [iodinated contrast media], Iohexol, Povidone-iodine, Penicillins, Adhesive [tape], Keflex  [cephalexin], and Valacyclovir     Review of Systems  Constitutional:  Negative for chills, fatigue and fever.  HENT:  Negative for congestion.   Eyes:  Negative for visual disturbance.  Respiratory:  Positive for shortness of breath. Negative for cough, hemoptysis, chest tightness, wheezing and stridor.   Cardiovascular:  Negative for chest pain, palpitations and leg swelling.  Gastrointestinal:  Negative for abdominal pain, constipation, diarrhea, nausea and vomiting.  Genitourinary:  Negative for dysuria and flank pain.  Musculoskeletal:  Positive for back pain. Negative for neck pain and neck stiffness.  Skin:  Positive for color change. Negative for rash and wound.  Neurological:  Negative for weakness, light-headedness, numbness and headaches.  Psychiatric/Behavioral:  Negative for agitation and confusion.   All other systems reviewed and are negative.   Updated Vital Signs BP (!) 150/65 (BP Location: Left Arm)   Pulse 95   Resp (!) 22   LMP  (LMP Unknown)   SpO2 96%   Physical Exam Vitals and nursing note reviewed.  Constitutional:      General: She is not in acute distress.    Appearance: She is well-developed. She is not ill-appearing, toxic-appearing or diaphoretic.  HENT:     Head: Normocephalic and atraumatic.     Right Ear: External ear normal.     Left Ear: External ear normal.     Nose: Nose normal. No congestion or rhinorrhea.     Mouth/Throat:     Mouth: Mucous membranes are moist.  Pharynx: No oropharyngeal exudate.   Eyes:     Conjunctiva/sclera: Conjunctivae normal.     Pupils: Pupils are equal, round, and reactive to light.    Cardiovascular:     Rate and Rhythm: Normal rate.  Pulmonary:     Effort: Tachypnea present. No respiratory distress.     Breath sounds: No stridor. Rhonchi present.  Chest:     Chest wall: No tenderness.  Abdominal:     General: There is no distension.     Tenderness: There is no abdominal tenderness. There is no  guarding or rebound.   Musculoskeletal:        General: Tenderness present.     Cervical back: Normal range of motion and neck supple. No tenderness.     Right lower leg: No edema.     Left lower leg: No edema.   Skin:    General: Skin is warm.     Findings: Bruising present. No erythema or rash.   Neurological:     Mental Status: She is alert and oriented to person, place, and time. Mental status is at baseline.     Sensory: No sensory deficit.     Motor: No weakness or abnormal muscle tone.     Deep Tendon Reflexes: Reflexes are normal and symmetric.   Psychiatric:        Mood and Affect: Mood normal.     (all labs ordered are listed, but only abnormal results are displayed) Labs Reviewed  COMPREHENSIVE METABOLIC PANEL WITH GFR - Abnormal; Notable for the following components:      Result Value   Alkaline Phosphatase 131 (*)    All other components within normal limits  PRO BRAIN NATRIURETIC PEPTIDE - Abnormal; Notable for the following components:   Pro Brain Natriuretic Peptide 1,559.0 (*)    All other components within normal limits  CBC WITH DIFFERENTIAL/PLATELET    EKG: EKG Interpretation Date/Time:  Wednesday April 04 2024 09:35:50 EDT Ventricular Rate:  94 PR Interval:  137 QRS Duration:  88 QT Interval:  357 QTC Calculation: 447 R Axis:   269  Text Interpretation: Sinus rhythm Ventricular trigeminy Probable left atrial enlargement Left anterior fascicular block Right ventricular hypertrophy Borderline T abnormalities, anterior leads when compared to prior, more PVC No STEMI Confirmed by Ginger Barefoot ([id number]) on 04/04/2024 10:08:59 AM  Radiology: CT CHEST ABDOMEN PELVIS WO CONTRAST Result Date: 04/04/2024 CLINICAL DATA:  multiple falls, tailbone pain, hypoxia when off oxygen . fule out fx, pulm contusion. EXAM: CT CHEST, ABDOMEN AND PELVIS WITHOUT CONTRAST TECHNIQUE: Multidetector CT imaging of the chest, abdomen and pelvis was performed following the standard  protocol without IV contrast. RADIATION DOSE REDUCTION: This exam was performed according to the departmental dose-optimization program which includes automated exposure control, adjustment of the mA and/or kV according to patient size and/or use of iterative reconstruction technique. COMPARISON:  CT scan chest, abdomen and pelvis from 10/20/2020. FINDINGS: CT CHEST FINDINGS Cardiovascular: Normal cardiac size. No pericardial effusion. No aortic aneurysm. There are coronary artery calcifications, in keeping with coronary artery disease. There are also moderate peripheral atherosclerotic vascular calcifications of thoracic aorta and its major branches. Mediastinum/Nodes: Visualized thyroid  gland appears grossly unremarkable. No solid / cystic mediastinal masses. The esophagus is nondistended precluding optimal assessment. There is small-to-moderate sliding hiatal hernia. No mediastinal or axillary lymphadenopathy by size criteria. Evaluation of bilateral hila is limited due to lack on intravenous contrast: however, no large hilar lymphadenopathy identified. Lungs/Pleura: The central tracheo-bronchial tree is patent. Diffuse moderate  to severe centrilobular emphysematous changes noted throughout bilateral lungs. There are patchy areas of linear, plate-like atelectasis and/or scarring throughout bilateral lungs. No mass or consolidation. No pleural effusion or pneumothorax. No suspicious lung nodules. Musculoskeletal: The visualized soft tissues of the chest wall are grossly unremarkable. No suspicious osseous lesions. There are mild to moderate multilevel degenerative changes in the visualized spine. Stable appearance of Schmorl's node in the T10 superior endplate. T12 vertebral body mild anterior wedging deformity is of indeterminate age but new since the prior study from 10/20/2020. There is no surrounding fat stranding favoring subacute/chronic etiology. No significant retropulsion or spinal canal compromise. CT  ABDOMEN PELVIS FINDINGS Hepatobiliary: The liver is normal in size. Non-cirrhotic configuration. No suspicious mass. There is increasing calcification along the hepatic dome. No intrahepatic or extrahepatic bile duct dilation. No calcified gallstones. Normal gallbladder wall thickness. No pericholecystic inflammatory changes. Pancreas: Unremarkable. No pancreatic ductal dilatation or surrounding inflammatory changes. Spleen: Within normal limits. No focal lesion. Adrenals/Urinary Tract: Adrenal glands are unremarkable. No suspicious renal mass within the limitations of this unenhanced exam. Mild diffuse cortical atrophy of bilateral kidneys noted. No hydroureteronephrosis or nephroureterolithiasis. Evaluation of urinary bladder is limited due to streak artifacts from left hip arthroplasty hardware. However, no focal mass seen in the visualized portion of the urinary bladder. No calculi or perivesical fat stranding noted. Stomach/Bowel: No disproportionate dilation of the small or large bowel loops. No evidence of abnormal bowel wall thickening or inflammatory changes. The appendix is unremarkable. There are multiple diverticula mainly in the sigmoid colon, without imaging signs of diverticulitis. Vascular/Lymphatic: No ascites or pneumoperitoneum. No abdominal or pelvic lymphadenopathy, by size criteria. No aneurysmal dilation of the major abdominal arteries. There are moderate peripheral atherosclerotic vascular calcifications of the aorta and its major branches. Reproductive: Not well evaluated on this exam. Note is made of probable small/atrophic uterus versus supracervical hysterectomy containing several dystrophic calcifications, which may represent calcified leiomyomas. No large adnexal mass seen. Other: There is a tiny fat containing umbilical hernia. The soft tissues and abdominal wall are otherwise unremarkable. Musculoskeletal: No suspicious osseous lesions. There is stable sclerotic lesion in the right  femoral neck and ischial tuberosity. Old healed fractures of right superior and inferior pubic rami noted. 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 There are mild - moderate multilevel degenerative changes in the visualized spine. Left hip arthroplasty noted. IMPRESSION: 1. No acute traumatic injury to the chest, abdomen or pelvis. 2. T12 vertebral body mild anterior wedging deformity is of indeterminate age but new since the prior study from 10/20/2020. There is no surrounding fat stranding favoring subacute/chronic etiology. No significant retropulsion or spinal canal compromise. 3. Multiple other nonacute observations, as described above. Aortic Atherosclerosis (ICD10-I70.0) and Emphysema (ICD10-J43.9). Electronically Signed   By: Ree Molt M.D.   On: 04/04/2024 11:34     Procedures   Medications Ordered in the ED - No data to display                                  Medical Decision Making Amount and/or Complexity of Data Reviewed Labs: ordered. Radiology: ordered.    Brooke Washington is a 83 y.o. female with a past medical history significant for COPD on intermittent home oxygen , GERD, hyperlipidemia, and previous large lymphoma who presents at direction of PCP for further evaluation of shortness of breath and hypoxia after recent falls.  Cording to patient and family, she had several falls last week initially was seen for tailbone pain.  She had imaging that did not show acute fracture but family has noticed that when her oxygen  is off even for a very slight time, her oxygen  drops into the 70s and 60s and she gets very short of breath.  Previously patient has not had these issues and was able to be off of oxygen  at times but now she is requiring constant oxygen .  When she arrived in the emergency department off of her oxygen  her sats were in the 60s.  She otherwise is not complaining of chest pain or upper back pain but still has some low back pain from  her fall.  She was told she had a bruised tailbone.  She has bruising in her extremities.  She denies any new pains or any new falls but with a talk to her PCP they told her to come to the emergency department for imaging of her chest and reassessment.  On my exam she is on 3 L and oxygen  saturations are in the 90s.  During conversation even on 3 L her oxygen  did drop into the 80s briefly.  Her lungs have coarse breath sounds bilaterally but she did not have any focal tenderness on her upper back or chest.  Low back was slightly tender with some bruising near the tailbone.  Legs had some bruising but she had intact sensation strength and pulses.  No significant edema seen.  Did not have a loud murmur on my exam.  Patient otherwise resting.  Given the patient's persistent low back discomfort, will get CT imaging to rule out occult fracture and will also get CT of the chest to rule out subtle rib fracture, pneumothorax, or pulmonary contusion.  Patient has contrast allergy so we will avoid dye.  Will get screening labs as the family reports that her labs were a little off the other day.  Anticipate reassessment after workup to determine disposition but anticipate if we do not find critical findings, she will likely be safe for discharge home remain on her oxygen  and then see her PCP to discuss further outpatient pulmonary management.  1:28 PM Workup and CT imaging overall reassuring.  She has emphysema but no significant traumatic injuries.  Patient was not hurting more and had this age-indeterminate T-spine injury.  Suspect this is old.  Patient and family agree with plan to follow-up with her pulmonologist and her PCP as soon as possible and discuss outpatient management.  She will remain on oxygen  at home and continue her home medications.  Patient agrees to plan of care as does family and they will be discharged for outpatient follow-up.      Final diagnoses:  Shortness of breath  Fall,  subsequent encounter    ED Discharge Orders     None       Clinical Impression: 1. Shortness of breath   2. Fall, subsequent encounter     Disposition: Discharge  Condition: Good  I have discussed the results, Dx and Tx plan with the pt(& family if present). He/she/they expressed understanding and agree(s) with the plan. Discharge instructions discussed at great length. Strict return precautions discussed and pt &/or family have verbalized understanding of the instructions. No further questions at time of discharge.    New Prescriptions   No medications on file    Follow Up: Merna Huxley, NP [address] [place] [address] [phone]  your pulmonologist         Alasdair Kleve, Lonni PARAS, MD 04/04/24 1331

## 2024-04-04 NOTE — Discharge Instructions (Signed)
 Your history, exam, workup today did not show evidence of significant new traumatic injury in your torso from your recent falls.  When you are on the oxygen , your oxygen  saturations were reassuring and you were well-appearing however as you report when your oxygen  was not on you before arrival your oxygen  saturations were very low and you are symptomatic with shortness of breath.  Given the reassuring workup today, we feel you are safe for discharge home but please remain on your home oxygen  and call your primary doctor and your lung doctor to get seen as soon as possible to discuss further outpatient management.  We agree that we did not find a problem that would need to be admitted today but please rest and stay hydrated.  Please be careful not to fall.  If any symptoms change or worsen acutely, please return to the nearest emergency department.

## 2024-04-04 NOTE — Telephone Encounter (Signed)
 Patient scheduled 7/30 with Dewald.

## 2024-04-04 NOTE — ED Triage Notes (Signed)
 C/o increased SHOB. Normally wears 5L as needed. 83% on RA upon arrival.  Hx of COPD. Denies CP.

## 2024-04-05 ENCOUNTER — Ambulatory Visit: Admitting: Adult Health

## 2024-04-06 ENCOUNTER — Telehealth: Payer: Self-pay

## 2024-04-06 ENCOUNTER — Ambulatory Visit (INDEPENDENT_AMBULATORY_CARE_PROVIDER_SITE_OTHER): Admitting: Adult Health

## 2024-04-06 ENCOUNTER — Ambulatory Visit: Payer: Self-pay

## 2024-04-06 VITALS — BP 100/68 | HR 107 | Temp 98.1°F

## 2024-04-06 DIAGNOSIS — J449 Chronic obstructive pulmonary disease, unspecified: Secondary | ICD-10-CM

## 2024-04-06 DIAGNOSIS — J9611 Chronic respiratory failure with hypoxia: Secondary | ICD-10-CM

## 2024-04-06 MED ORDER — BREZTRI AEROSPHERE 160-9-4.8 MCG/ACT IN AERO: 2.0000 | INHALATION_SPRAY | Freq: Two times a day (BID) | RESPIRATORY_TRACT | 6 refills | Status: DC

## 2024-04-06 NOTE — Telephone Encounter (Signed)
 Correction- pt will not be seen by Dr Kara until 05-09-24

## 2024-04-06 NOTE — Telephone Encounter (Signed)
 Verbal ok from Dr Kara to send urgent o2 order as pt will do a re qualifying walk at upcoming appointment.

## 2024-04-06 NOTE — Telephone Encounter (Signed)
 Pt was seen today 04/06/2024 with Darleene Shape, NP.

## 2024-04-06 NOTE — Telephone Encounter (Signed)
 Pt will be seen today and we will do the appropriate test. Spoke to [employer] and they are able to assist with the O2 supplies.

## 2024-04-06 NOTE — Telephone Encounter (Signed)
 FYI Only or Action Required?: Action required by provider: update on patient condition.  Patient was last seen in primary care on 01/13/2024 by Micheal Wolm ORN, MD. Called Nurse Triage reporting medical equipment. Symptoms began yesterday. Interventions attempted: Other: oxygen  concentrator died. Symptoms are: gradually worsening.  Triage Disposition: Call PCP Now  Patient/caregiver understands and will follow disposition?: Yes  Copied from CRM [id number]. Topic: Clinical - Red Word Triage >> Apr 06, 2024  8:45 AM Brooke Washington wrote: Red Word that prompted transfer to Nurse Triage: Red Word that prompted transfer to Nurse Triage: Brooke Washington husband of patient is calling in because wife is out of oxygen  and has none left. Tried contacting office but no one answered. Husband may call back because I was disconnected and couldn't hear anymore. Reason for Disposition  [1] Caller has URGENT question AND [2] triager unable to answer question  Answer Assessment - Initial Assessment Questions 1. MAIN CONCERN OR SYMPTOM: What's your main concern? (e.g., low oxygen  level, breathing difficulty) What question do you have?     Husband is concerned about oxygen  concentrator. Patient's oxygen  saturation levels are difficult to maintain above 90% 2. ONSET: When did the  oxygen  concentrator died?      Last night 3. OXYGEN  THERAPY:    - Do you currently use home oxygen ? yes   - If Yes, ask: What is your oxygen  source? (e.g., O2 tank, O2 concentrator).    - If Yes, ask: How do you get the oxygen ? (e.g., nasal prongs, face mask).    - If Yes, ask: How much oxygen  are you supposed to use? (e.g., 1-2 L [place])     O2 concentrator 4.  OXYGEN  EQUIPMENT:  Are you having any trouble with your oxygen  equipment?  (e.g., cannula, mask, tubing, tank, concentrator)     Concentrator died last night-husband called pulmonary to get a new prescription per [employer]-pulmonary states they can't write a new prescription  5.  OXYGEN  SATURATION MONITOR:     - Do you use an oxygen  saturation monitor (pulse oximeter) at home?    - If Yes, ask: Where do you place the probe? (e.g., fingertip, ear lobe)     fingertip 6. OXYGEN  LEVEL: What is your reading (oxygen  level) today? What is your usual oxygen  saturation reading? (e.g., 95%) 87% on Room air      7. VSS MONITORING: Do you monitor/measure your oxygen  level or vital signs? (e.g., yes, no, measurements are automatically sent to provider/call center). Document CURRENT and NORMAL BASELINE values if available.     -  P: What is your pulse rate per minute?   -  RR: What is your respiratory rate per minute?     Yes patient is monitoring her oxygen  regularly.  8. BREATHING DIFFICULTY: Are you having any difficulty breathing? If Yes, ask: How bad is it?  (e.g., none, mild, moderate, severe)    - MILD: No SOB at rest, mild SOB with walking, speaks normally in sentences, able to lie down, no retractions, pulse < 100.    - MODERATE: SOB at rest, SOB with minimal exertion and prefers to sit, cannot lie down flat, speaks in phrases, mild retractions, audible wheezing, pulse 100-120.    - SEVERE: Very SOB at rest, speaks in single words, struggling to breathe, sitting hunched forward, retractions, pulse > 120      With movement is having shortness of breath 9. OTHER SYMPTOMS: Do you have any other symptoms? (e.g., fever, change in sputum)  no 10. SMOKING: Do you smoke currently? Is there anyone that smokes around you?  (Note: smoking around oxygen  is dangerous!)       No  Patient's oxygen  concentrator and travel unit died last night. Patient's pulse oximeter readings drop into the low 80s with any movement. Current pulse oximeter reading is 87% resting. No current complaints. Patient's husband called both pulmonary and primary care. Patient's husband states he called [employer] the company that supplied the oxygen  concentrator-company said they needed a new  prescription for oxygen . Patient's husband called pulmonary-was told that pulmonary couldn't write a new prescription because she hadn't been seen since 2022 and that primary could write for the concentrator. Patient is scheduled to see pulmonary 05/09/2024. Primary office was called by this RN who stated they were unsure if they could write for oxygen  since they didn't write for it initially. Patient's husband states he was told by pulmonary initially that primary care could reorder concentrator. This RN spoke with pulmonary office and gave pertinent information about patient's history. MA in office states she would send the information to the pulmonologist that the patient will be seeing in July and see what the office could do. ED precautions given to husband for patient. Patient's husband is instructed to call back to office with any issues or concerns.  Protocols used: Oxygen  Monitoring and Hypoxia-A-AH

## 2024-04-06 NOTE — Telephone Encounter (Signed)
 Her o2 concentrator died last night. Pt fell on the 20th and started having more of a need for oxygen . Pt has an appointment on June 30th. Pt is on a travel unit but it died, it maybe works for a couple minutes then dies again. Needs a new RX for a concentrator but pt has not been seen since 2022. Pt was admitted on June 20th and 25th, and saw PCP on the 4th. If pt moves at all, her o2 levels drop to the 80's. Please advise. If she has not been seen in 2 years, they may make her do a qualifying walk before they will provide her anything. Pt is suppose to be on o2 24/7.   DME- [facility]- [phone]  I also called pt's PCP office to see if they could order oxygen  as pt has not been seen in almost 3 years and the DME will make her do a qualifying walk first. Husband was speaking to a nurse there but line was disconnected. Front office will try and have the nurse contact the pt's husband again for this. I spoke to the nurse and she stated they have no ordering capabilities and have not touched DME. Nurse Jenkins will send a message to Darleene Shape, NP (PCP) to see if he can try and assist with this. We could send a urgent order. Nurse Jenkins advised for our office to wait as they would send a triage message to pcp.

## 2024-04-06 NOTE — Progress Notes (Addendum)
 Subjective:    [name], female    DOB: 13-Aug-1941, 83 y.o.   MRN: [id number]  HPI  83 year old female who  has a past medical history of Cancer (HCC) ( APRIL OF 2000), COPD (chronic obstructive pulmonary disease) (HCC), Depression, Emphysema of lung (HCC), GERD (gastroesophageal reflux disease), Hyperlipidemia, and S/p left hip fracture (04/15/2019).  She has known COPD and in the past has been on intermittent home oxygen .    He was seen in the emergency room earlier this week after family had noticed that her oxygen  even when off for a very slight time would drop into the 70s and 60s and she would get very short of breath.  In the past she had not had these issues and was able to be off oxygen  at times but as of recently has had to be on more constant oxygen .  When she arrived in the emergency room her oxygen  sats were in the 60s.  She was placed on 3 L of oxygen  and her sats came up into the 90s but during conversation even on 3 L her oxygen  did drop into the 80s briefly.  He did have a CT of the chest while in the emergency room was reassuring.  He was discharged home and advised to remain on oxygen .  She was referred back to pulmonary since she has not been seen in greater than 3 years and has an appointment in about a month.  Unfortunately, the patient's oxygen  concentrator and travel unit died last night and her pulse ox without oxygen  was dropping to 75%. With activity her pulse ox was dropping into the 60's. When she is on oxygen  ( 3L) at rest her pulse ox is 97%  Pulmonary called in an order earlier today for oxygen  and it is being delivered this afternoon. Pulmonary will do a reassessment at their next visit. The husband does not need a new concentrator at this time, as he is going to get the current one serviced.   In the office today her pulse ox on room was 85%. With walking less than 25 yards her pulse ox dropped to 68%. She was placed on 3 L [place] and her pulse ox  incrased to 97% quickly.   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   COPD (chronic obstructive pulmonary disease) (HCC)    Depression    Emphysema of lung (HCC)    GERD (gastroesophageal reflux disease)    Hyperlipidemia    S/p left hip fracture 04/15/2019    Social History   Socioeconomic History   Marital status: Married    Spouse name: Not on file   Number of children: Not on file   Years of education: Not on file   Highest education level: Bachelor's degree (e.g., BA, AB, BS)  Occupational History   Not on file  Tobacco Use   Smoking status: Former    Current packs/day: 0.00    Average packs/day: 0.3 packs/day for 51.0 years (12.8 ttl pk-yrs)    Types: Cigarettes    Start date: 03/11/1964    Quit date: 03/12/2015    Years since quitting: 9.0   Smokeless tobacco: Never  Vaping Use   Vaping status: Never Used  Substance and Sexual Activity   Alcohol use: Yes    Alcohol/week: 0.0  standard drinks of alcohol    Comment: 1 glass of wine monthly   Drug use: No   Sexual activity: Not Currently    Birth control/protection: Post-menopausal  Other Topics Concern   Not on file  Social History Narrative   Married 51 years    One daughter who lives in [place], [place]   - 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       Pets: Dog      Diet: Healthy diet, fruits, veggies and fish.    Exercise: None       Social Drivers of Health   Financial Resource Strain: Low Risk  (04/06/2024)   Overall Financial Resource Strain (CARDIA)    Difficulty of Paying Living Expenses: Not hard at all  Food Insecurity: No Food Insecurity (04/06/2024)   Hunger Vital Sign    Worried About Running Out of Food in the Last Year: Never true    Ran Out of Food in the  Last Year: Never true  Transportation Needs: No Transportation Needs (04/06/2024)   PRAPARE - Administrator, Civil Service (Medical): No    Lack of Transportation (Non-Medical): No  Physical Activity: Inactive (04/06/2024)   Exercise Vital Sign    Days of Exercise per Week: 0 days    Minutes of Exercise per Session: Not on file  Stress: Patient Declined (04/06/2024)   [employer] of Occupational Health - Occupational Stress Questionnaire    Feeling of Stress: Patient declined  Social Connections: Moderately Isolated (04/06/2024)   Social Connection and Isolation Panel    Frequency of Communication with Friends and Family: Three times a week    Frequency of Social Gatherings with Friends and Family: Three times a week    Attends Religious Services: Never    Active Member of Clubs or Organizations: No    Attends Engineer, structural: Not on file    Marital Status: Married  Catering manager Violence: Not on file    Past Surgical History:  Procedure Laterality Date   CYSTECTOMY W/ URETEROILEAL CONDUIT  1997   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   FRACTURE SURGERY     IR FLUORO GUIDE PORT INSERTION RIGHT  12/07/2017   IR REMOVAL TUN ACCESS W/ PORT W/O FL MOD SED  11/04/2020   IR US  GUIDE VASC ACCESS RIGHT  12/07/2017   ORIF HUMERUS FRACTURE Right 11/23/2017   Procedure: OPEN REDUCTION INTERNAL FIXATION (ORIF) HUMERAL SHAFT FRACTURE;  Surgeon: Kendal Franky SQUIBB, MD;  Location: [facility] OR;  Service: Orthopedics;  Laterality: Right;   RIGHT OOPHORECTOMY  1976   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   Procedure: TOTAL HIP ARTHROPLASTY ANTERIOR APPROACH LEFT HIP;  Surgeon: Fidel Rogue, MD;  Location: [facility] OR;  Service: Orthopedics;  Laterality: Left;    Family History  Problem Relation Age of Onset   Cancer Mother        colon ca    Allergies  Allergen Reactions   Contrast Media [Iodinated Contrast Media] Shortness Of Breath, Rash and  Other (See Comments)    Throat swells   Iohexol Shortness Of Breath   Povidone-Iodine Hives, Shortness Of Breath and Rash    REACTION: throat swells   Penicillins Hives, Itching and Rash    Has patient had a PCN reaction causing immediate rash, facial/tongue/throat  swelling, SOB or lightheadedness with hypotension: Unknown Has patient had a PCN reaction causing severe rash involving mucus membranes or skin necrosis: Yes Has patient had a PCN reaction that required hospitalization: Unknown Has patient had a PCN reaction occurring within the last 10 years: Unknown If all of the above answers are NO, then may proceed with Cephalosporin use.    Adhesive [Tape] Hives   Keflex [Cephalexin]    Valacyclovir  Other (See Comments)    Current Outpatient Medications on File Prior to Visit  Medication Sig Dispense Refill   albuterol  (VENTOLIN  HFA) 108 (90 Base) MCG/ACT inhaler Inhale 2 puffs into the lungs every 6 (six) hours as needed for wheezing or shortness of breath. 1 each 3   aspirin  EC 81 MG tablet Take 81 mg by mouth daily.     cholecalciferol  (VITAMIN D ) 1000 units tablet Take 2,000 Units by mouth daily.     DULoxetine  (CYMBALTA ) 30 MG capsule TAKE 1 CAPSULE BY MOUTH DAILY 90 capsule 1   KRILL OIL PO Take by mouth.     MAGNESIUM  GLYCINATE PO Take 1 tablet by mouth daily.     triamcinolone  cream (KENALOG ) 0.1 % Apply topically.     No current facility-administered medications on file prior to visit.    BP 100/68   Pulse (!) 107   Temp 98.1 F (36.7 C) (Oral)   LMP  (LMP Unknown)   SpO2 97% Comment: on 3L via [place]      Objective:   Physical Exam Vitals and nursing note reviewed.  Constitutional:      Appearance: Normal appearance.   Cardiovascular:     Rate and Rhythm: Normal rate and regular rhythm.     Pulses: Normal pulses.     Heart sounds: Normal heart sounds.  Pulmonary:     Effort: Pulmonary effort is normal.     Breath sounds: Normal breath sounds.   Skin:     General: Skin is warm and dry.     Capillary Refill: Capillary refill takes less than 2 seconds.   Neurological:     General: No focal deficit present.     Mental Status: She is alert and oriented to person, place, and time.   Psychiatric:        Mood and Affect: Mood normal.        Behavior: Behavior normal.        Thought Content: Thought content normal.        Judgment: Judgment normal.        Assessment & Plan:  1. COPD GOLD II (Primary) - Advised husband and patient to let us  know if they end up needing a new order for a concentrator.  - Follow up with pulmonary as directed - budesonide-glycopyrrolate-formoterol  (BREZTRI  AEROSPHERE) 160-9-4.8 MCG/ACT AERO inhaler; Inhale 2 puffs into the lungs in the morning and at bedtime.  Dispense: 10.7 g; Refill: 6   Brooke Shape, NP  Time spent with patient today was 32 minutes which consisted of chart review, discussing COPD work up, treatment answering questions and documentation.

## 2024-04-06 NOTE — Addendum Note (Signed)
 Addended by: Elfriede Bonini T on: 04/06/2024 10:20 AM   Modules accepted: Orders

## 2024-04-06 NOTE — Telephone Encounter (Signed)
 Ashlyn ([facility] staff) calling inquiring about E2C2 capabilities for DME assistance since pt has not been seen by [facility] in 3 years. Triager informed that [facility] is not able to assist with DME at this time, but Triager will forward to PCP office to see if they can provide guidance on emergency O2 from DME company.   Of note, pt does have appt with [facility] 7/30, but due to pt needing O2 ATC, Ashlyn reaching out to PCP to provide more immediate assistance.

## 2024-04-26 ENCOUNTER — Encounter: Payer: Self-pay | Admitting: Adult Health

## 2024-04-26 ENCOUNTER — Ambulatory Visit (INDEPENDENT_AMBULATORY_CARE_PROVIDER_SITE_OTHER): Admitting: Adult Health

## 2024-04-26 VITALS — BP 122/62 | HR 99 | Temp 97.9°F | Ht 64.0 in | Wt 118.0 lb

## 2024-04-26 DIAGNOSIS — L739 Follicular disorder, unspecified: Secondary | ICD-10-CM | POA: Diagnosis not present

## 2024-04-26 MED ORDER — DOXYCYCLINE HYCLATE 100 MG PO CAPS
100.0000 mg | ORAL_CAPSULE | Freq: Two times a day (BID) | ORAL | 0 refills | Status: AC
Start: 2024-04-26 — End: ?

## 2024-04-26 MED ORDER — CHLORHEXIDINE GLUCONATE 4 % EX SOLN: Freq: Every day | CUTANEOUS | 1 refills | Status: AC | PRN

## 2024-04-26 MED ORDER — PREDNISONE 10 MG PO TABS
ORAL_TABLET | ORAL | 0 refills | Status: AC
Start: 2024-04-26 — End: ?

## 2024-04-26 NOTE — Progress Notes (Signed)
 Subjective:    [name], female    DOB: [birth year], 83 y.o.   MRN: [id number]  HPI  83 year old female who  has a past medical history of Cancer (HCC) ( APRIL OF 2000), COPD (chronic obstructive pulmonary disease) (HCC), Depression, Emphysema of lung (HCC), GERD (gastroesophageal reflux disease), Hyperlipidemia, and S/p left hip fracture (04/15/2019).  She presents to the office today for a rash on her left arm that started about 3 weeks ago.  She reports that it started after she had a blood pressure cuff on her left arm during the visit to the emergency room.  Over the last 3 weeks the rashes started to spread up her left arm onto her neck and onto her right arm..  Rash itches significantly at night and has a mild tingling/burning sensation.  At home she has been using an ice pack, cortisone cream and a few days of prednisone  10 mg that she had leftover from her dermatologist.  She has had no relief with his various treatments. 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   COPD (chronic obstructive pulmonary disease) (HCC)    Depression    Emphysema of lung (HCC)    GERD (gastroesophageal reflux disease)    Hyperlipidemia    S/p left hip fracture 04/15/2019    Social History   Socioeconomic History   Marital status: Married    Spouse name: Not on file   Number of children: Not on file   Years of education: Not on file   Highest education level: Bachelor's degree (e.g., BA, AB, BS)  Occupational History   Not on file  Tobacco Use   Smoking status: Former    Current packs/day: 0.00    Average packs/day: 0.3 packs/day for 51.0 years (12.8 ttl pk-yrs)    Types: Cigarettes    Start date: 03/11/1964    Quit date: 03/12/2015    Years since quitting: 9.1   Smokeless tobacco: Never  Vaping Use   Vaping status: Never Used   Substance and Sexual Activity   Alcohol use: Yes    Alcohol/week: 0.0 standard drinks of alcohol    Comment: 1 glass of wine monthly   Drug use: No   Sexual activity: Not Currently    Birth control/protection: Post-menopausal  Other Topics Concern   Not on file  Social History Narrative   Married 51 years    One daughter who lives in [place], [place]   - 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       Pets: Dog      Diet: Healthy diet, fruits, veggies and fish.    Exercise: None       Social Drivers of Health   Financial Resource Strain: Low Risk  (04/06/2024)   Overall Financial Resource Strain (CARDIA)    Difficulty of Paying Living Expenses: Not hard at all  Food Insecurity: No Food Insecurity (04/06/2024)   Hunger Vital Sign    Worried About Running Out of Food in the Last Year: Never true    Ran Out of Food in the Last Year: Never true  Transportation Needs: No Transportation Needs (04/06/2024)  PRAPARE - Administrator, Civil Service (Medical): No    Lack of Transportation (Non-Medical): No  Physical Activity: Inactive (04/06/2024)   Exercise Vital Sign    Days of Exercise per Week: 0 days    Minutes of Exercise per Session: Not on file  Stress: Patient Declined (04/06/2024)   [employer] of Occupational Health - Occupational Stress Questionnaire    Feeling of Stress: Patient declined  Social Connections: Moderately Isolated (04/06/2024)   Social Connection and Isolation Panel    Frequency of Communication with Friends and Family: Three times a week    Frequency of Social Gatherings with Friends and Family: Three times a week    Attends Religious Services: Never    Active Member of Clubs or Organizations: No    Attends Engineer, structural: Not on file    Marital Status: Married  Catering manager Violence: Not on file    Past Surgical  History:  Procedure Laterality Date   CYSTECTOMY W/ URETEROILEAL CONDUIT  1997   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   FRACTURE SURGERY     IR FLUORO GUIDE PORT INSERTION RIGHT  12/07/2017   IR REMOVAL TUN ACCESS W/ PORT W/O FL MOD SED  11/04/2020   IR US  GUIDE VASC ACCESS RIGHT  12/07/2017   ORIF HUMERUS FRACTURE Right 11/23/2017   Procedure: OPEN REDUCTION INTERNAL FIXATION (ORIF) HUMERAL SHAFT FRACTURE;  Surgeon: Kendal Franky SQUIBB, MD;  Location: [facility] OR;  Service: Orthopedics;  Laterality: Right;   RIGHT OOPHORECTOMY  1976   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   Procedure: TOTAL HIP ARTHROPLASTY ANTERIOR APPROACH LEFT HIP;  Surgeon: Fidel Rogue, MD;  Location: [facility] OR;  Service: Orthopedics;  Laterality: Left;    Family History  Problem Relation Age of Onset   Cancer Mother        colon ca    Allergies  Allergen Reactions   Contrast Media [Iodinated Contrast Media] Shortness Of Breath, Rash and Other (See Comments)    Throat swells   Iohexol Shortness Of Breath   Povidone-Iodine Hives, Shortness Of Breath and Rash    REACTION: throat swells   Penicillins Hives, Itching and Rash    Has patient had a PCN reaction causing immediate rash, facial/tongue/throat swelling, SOB or lightheadedness with hypotension: Unknown Has patient had a PCN reaction causing severe rash involving mucus membranes or skin necrosis: Yes Has patient had a PCN reaction that required hospitalization: Unknown Has patient had a PCN reaction occurring within the last 10 years: Unknown If all of the above answers are NO, then may proceed with Cephalosporin use.    Adhesive [Tape] Hives   Keflex [Cephalexin]    Valacyclovir  Other (See Comments)    Current Outpatient Medications on File Prior to Visit  Medication Sig Dispense Refill   albuterol  (VENTOLIN  HFA) 108 (90 Base) MCG/ACT inhaler Inhale 2 puffs into the lungs every 6 (six) hours as needed for wheezing or shortness of  breath. 1 each 3   aspirin  EC 81 MG tablet Take 81 mg by mouth daily.     budesonide-glycopyrrolate-formoterol  (BREZTRI  AEROSPHERE) 160-9-4.8 MCG/ACT AERO inhaler Inhale 2 puffs into the lungs in the morning and at bedtime. 10.7 g 6   cholecalciferol  (VITAMIN D ) 1000 units tablet Take 2,000 Units by mouth daily.     DULoxetine  (CYMBALTA ) 30 MG capsule TAKE 1 CAPSULE BY MOUTH DAILY 90 capsule 1   KRILL OIL PO Take by mouth.     MAGNESIUM   GLYCINATE PO Take 1 tablet by mouth daily.     triamcinolone  cream (KENALOG ) 0.1 % Apply topically.     No current facility-administered medications on file prior to visit.    BP 122/62   Pulse 99   Temp 97.9 F (36.6 C) (Oral)   Ht 5' 4 (1.626 m)   Wt 118 lb (53.5 kg)   LMP  (LMP Unknown)   SpO2 95%   BMI 20.25 kg/m       Objective:   Physical Exam Vitals and nursing note reviewed.  Constitutional:      Appearance: Normal appearance.  Musculoskeletal:        General: Normal range of motion.  Skin:    General: Skin is warm and dry.     Capillary Refill: Capillary refill takes less than 2 seconds.     Findings: Erythema and rash present.     Comments: She has a red follicular rash that presents on left mid/upper arm, left neck/left chest wall and right mid arm.   Neurological:     General: No focal deficit present.     Mental Status: She is alert and oriented to person, place, and time.  Psychiatric:        Mood and Affect: Mood normal.        Behavior: Behavior normal.        Thought Content: Thought content normal.        Judgment: Judgment normal.        Assessment & Plan:   1. Folliculitis (Primary) - Will treat for suspected folliculitis.  - Follow up next week if not improving. She can use OTC benadryl  cream for itch - chlorhexidine  (HIBICLENS ) 4 % external liquid; Apply topically daily as needed.  Dispense: 473 mL; Refill: 1 - doxycycline  (VIBRAMYCIN ) 100 MG capsule; Take 1 capsule (100 mg total) by mouth 2 (two) times  daily.  Dispense: 20 capsule; Refill: 0 - predniSONE  (DELTASONE ) 10 MG tablet; 40 mg x 3 days, 20 mg x 3 days, 10 mg x 3 days  Dispense: 21 tablet; Refill: 0  Darleene Shape, NP

## 2024-05-09 ENCOUNTER — Ambulatory Visit (INDEPENDENT_AMBULATORY_CARE_PROVIDER_SITE_OTHER): Admitting: Pulmonary Disease

## 2024-05-09 ENCOUNTER — Encounter: Payer: Self-pay | Admitting: Pulmonary Disease

## 2024-05-09 VITALS — BP 124/78 | HR 97 | Ht 64.0 in | Wt 119.0 lb

## 2024-05-09 DIAGNOSIS — R7989 Other specified abnormal findings of blood chemistry: Secondary | ICD-10-CM | POA: Diagnosis not present

## 2024-05-09 DIAGNOSIS — J449 Chronic obstructive pulmonary disease, unspecified: Secondary | ICD-10-CM

## 2024-05-09 DIAGNOSIS — J9611 Chronic respiratory failure with hypoxia: Secondary | ICD-10-CM

## 2024-05-09 DIAGNOSIS — I509 Heart failure, unspecified: Secondary | ICD-10-CM

## 2024-05-09 NOTE — Patient Instructions (Addendum)
 Continue breztri  inhaler 2 puffs twice daily - rinse mouth out after each use  We will walk you today in order to titrate the portable oxygen  concentrator  We will check an overnight oxygen  test on room air  Follow up 4 months, call sooner if needed

## 2024-05-09 NOTE — Progress Notes (Signed)
 Subjective:   [name] GENDER: female DOB: 1941/09/24, MRN: [id number]   HPI Discussed the use of AI scribe software for clinical note transcription with the patient, who gave verbal consent to proceed.  Brooke Washington is an 83 year old woman with history of COPD and chronic hypoxemic respiratory who returns to pulmonary clinic for follow up.   She uses oxygen  primarily during activities such as walking to the bathroom and while washing her hair or brushing her teeth due to dyspnea. Oxygen  is used for about five minutes until she feels better. She avoids using oxygen  at night and minimizes its use during the day. In October 2022, a walk test showed her oxygen  saturation dropped to 85% on room air. She currently uses a home concentrator set at three liters, with oxygen  levels typically ranging from 92% to 94% as monitored by an oximeter.  She uses a Breztri  inhaler, taking two puffs twice daily, and has an albuterol  inhaler that she rarely uses. Initially, she felt the Breztri  was ineffective but now feels it is working better, possibly due to increased exercise. She also uses a respiratory exercise device to strengthen her lungs.  She has experienced swelling in her feet, which has resolved. She has neuropathy in her legs, causing numbness in her feet. She has a history of emergency room visits due to respiratory distress, where she required immediate oxygen  therapy. Her husband notes that she almost fell on the floor during one episode due to difficulty breathing.  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   COPD (chronic obstructive pulmonary disease) (HCC)    Depression    Emphysema of lung (HCC)    GERD (gastroesophageal reflux disease)    Hyperlipidemia    S/p left hip fracture 04/15/2019     Family History  Problem Relation Age of Onset   Cancer Mother        colon ca     Social History    Socioeconomic History   Marital status: Married    Spouse name: Not on file   Number of children: Not on file   Years of education: Not on file   Highest education level: Bachelor's degree (e.g., BA, AB, BS)  Occupational History   Not on file  Tobacco Use   Smoking status: Former    Current packs/day: 0.00    Average packs/day: 0.3 packs/day for 51.0 years (12.8 ttl pk-yrs)    Types: Cigarettes    Start date: 03/11/1964    Quit date: 03/12/2015    Years since quitting: 9.1   Smokeless tobacco: Never  Vaping Use   Vaping status: Never Used  Substance and Sexual Activity   Alcohol use: Yes    Alcohol/week: 0.0 standard drinks of alcohol    Comment: 1 glass of wine monthly   Drug use: No   Sexual activity: Not Currently    Birth control/protection: Post-menopausal  Other Topics Concern   Not on file  Social History Narrative   Married 51 years    One daughter who lives in [place], Brooke Washington   - 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       Pets: Dog      Diet: Healthy diet, fruits, veggies and fish.  Exercise: None       Social Drivers of Health   Financial Resource Strain: Low Risk  (04/06/2024)   Overall Financial Resource Strain (CARDIA)    Difficulty of Paying Living Expenses: Not hard at all  Food Insecurity: No Food Insecurity (04/06/2024)   Hunger Vital Sign    Worried About Running Out of Food in the Last Year: Never true    Ran Out of Food in the Last Year: Never true  Transportation Needs: No Transportation Needs (04/06/2024)   PRAPARE - Administrator, Civil Service (Medical): No    Lack of Transportation (Non-Medical): No  Physical Activity: Inactive (04/06/2024)   Exercise Vital Sign    Days of Exercise per Week: 0 days    Minutes of Exercise per Session: Not on file  Stress: Patient Declined (04/06/2024)   Brooke Washington of  Occupational Health - Occupational Stress Questionnaire    Feeling of Stress: Patient declined  Social Connections: Moderately Isolated (04/06/2024)   Social Connection and Isolation Panel    Frequency of Communication with Friends and Family: Three times a week    Frequency of Social Gatherings with Friends and Family: Three times a week    Attends Religious Services: Never    Active Member of Clubs or Organizations: No    Attends Engineer, structural: Not on file    Marital Status: Married  Catering manager Violence: Not on file     Allergies  Allergen Reactions   Contrast Media [Iodinated Contrast Media] Shortness Of Breath, Rash and Other (See Comments)    Throat swells   Iohexol Shortness Of Breath   Povidone-Iodine Hives, Shortness Of Breath and Rash    REACTION: throat swells   Penicillins Hives, Itching and Rash    Has patient had a PCN reaction causing immediate rash, facial/tongue/throat swelling, SOB or lightheadedness with hypotension: Unknown Has patient had a PCN reaction causing severe rash involving mucus membranes or skin necrosis: Yes Has patient had a PCN reaction that required hospitalization: Unknown Has patient had a PCN reaction occurring within the last 10 years: Unknown If all of the above answers are NO, then may proceed with Cephalosporin use.    Adhesive [Tape] Hives   Keflex [Cephalexin]    Valacyclovir  Other (See Comments)     Outpatient Medications Prior to Visit  Medication Sig Dispense Refill   albuterol  (VENTOLIN  HFA) 108 (90 Base) MCG/ACT inhaler Inhale 2 puffs into the lungs every 6 (six) hours as needed for wheezing or shortness of breath. 1 each 3   aspirin  EC 81 MG tablet Take 81 mg by mouth daily.     budesonide-glycopyrrolate-formoterol  (BREZTRI  AEROSPHERE) 160-9-4.8 MCG/ACT AERO inhaler Inhale 2 puffs into the lungs in the morning and at bedtime. 10.7 g 6   chlorhexidine  (HIBICLENS ) 4 % external liquid Apply topically daily as  needed. 473 mL 1   cholecalciferol  (VITAMIN D ) 1000 units tablet Take 2,000 Units by mouth daily.     doxycycline  (VIBRAMYCIN ) 100 MG capsule Take 1 capsule (100 mg total) by mouth 2 (two) times daily. 20 capsule 0   DULoxetine  (CYMBALTA ) 30 MG capsule TAKE 1 CAPSULE BY MOUTH DAILY 90 capsule 1   KRILL OIL PO Take by mouth.     MAGNESIUM  GLYCINATE PO Take 1 tablet by mouth daily.     predniSONE  (DELTASONE ) 10 MG tablet 40 mg x 3 days, 20 mg x 3 days, 10 mg x 3 days 21 tablet 0   triamcinolone  cream (  KENALOG ) 0.1 % Apply topically.     No facility-administered medications prior to visit.    Review of Systems  Constitutional:  Negative for chills, fever, malaise/fatigue and weight loss.  HENT:  Negative for congestion, sinus pain and sore throat.   Eyes: Negative.   Respiratory:  Positive for shortness of breath. Negative for cough, hemoptysis, sputum production and wheezing.   Cardiovascular:  Positive for leg swelling. Negative for chest pain, palpitations, orthopnea and claudication.  Gastrointestinal:  Negative for abdominal pain, heartburn, nausea and vomiting.  Genitourinary: Negative.   Musculoskeletal:  Negative for joint pain and myalgias.  Skin:  Negative for rash.  Neurological:  Negative for weakness.  Endo/Heme/Allergies: Negative.   Psychiatric/Behavioral: Negative.     Objective:   Vitals:   05/09/24 1026  BP: 124/78  Pulse: 97  SpO2: 94%  Weight: 119 lb (54 kg)  Height: 5' 4 (1.626 m)   Physical Exam Constitutional:      General: She is not in acute distress.    Appearance: Normal appearance.  Eyes:     General: No scleral icterus.    Conjunctiva/sclera: Conjunctivae normal.  Cardiovascular:     Rate and Rhythm: Normal rate and regular rhythm.  Pulmonary:     Breath sounds: No wheezing, rhonchi or rales.  Musculoskeletal:     Right lower leg: No edema.     Left lower leg: No edema.  Skin:    General: Skin is warm and dry.  Neurological:     General:  No focal deficit present.    CBC    Component Value Date/Time   WBC 7.1 04/04/2024 1124   RBC 4.31 04/04/2024 1124   HGB 13.6 04/04/2024 1124   HGB 13.4 04/08/2022 1124   HGB 14.0 12/28/2016 1002   HCT 42.9 04/04/2024 1124   HCT 20.2 (L) 04/12/2019 1833   HCT 41.9 12/28/2016 1002   PLT 305 04/04/2024 1124   PLT 290 04/08/2022 1124   PLT 263 12/28/2016 1002   MCV 99.5 04/04/2024 1124   MCV 95.4 12/28/2016 1002   MCH 31.6 04/04/2024 1124   MCHC 31.7 04/04/2024 1124   RDW 14.3 04/04/2024 1124   RDW 12.9 12/28/2016 1002   LYMPHSABS 0.8 04/04/2024 1124   LYMPHSABS 1.3 12/28/2016 1002   MONOABS 0.7 04/04/2024 1124   MONOABS 0.8 12/28/2016 1002   EOSABS 0.5 04/04/2024 1124   EOSABS 0.4 12/28/2016 1002   BASOSABS 0.0 04/04/2024 1124   BASOSABS 0.1 12/28/2016 1002      Latest Ref Rng & Units 04/04/2024   11:24 AM 03/30/2024   11:17 AM 04/08/2023    9:20 AM  BMP  Glucose 70 - 99 mg/dL 94  893  81   BUN 8 - 23 mg/dL 17  17  18    Creatinine 0.44 - 1.00 mg/dL 9.13  9.03  9.25   Sodium 135 - 145 mmol/L 145  141  142   Potassium 3.5 - 5.1 mmol/L 3.8  4.0  4.2   Chloride 98 - 111 mmol/L 105  102  107   CO2 22 - 32 mmol/L 28  27  27    Calcium  8.9 - 10.3 mg/dL 89.6  9.3  89.9    Chest imaging: CT Chest 10/20/20 1. No acute process or evidence of active lymphoma within the chest, abdomen, or pelvis. 2. Degraded evaluation of the pelvis, secondary to beam hardening artifact from left hip arthroplasty. 3. Slight increased calcification within the hepatic dome, likely dystrophic at the site  of a dominant mass back on the CT of 12/24/2017. Recommend attention on follow-up. 4. Aortic atherosclerosis (ICD10-I70.0), coronary artery atherosclerosis and emphysema (ICD10-J43.9).  PFT:    Latest Ref Rng & Units 08/16/2017    9:19 AM  PFT Results  FVC-Pre L 2.05   FVC-Predicted Pre % 78   FVC-Post L 2.32   FVC-Predicted Post % 88   Pre FEV1/FVC % % 54   Post FEV1/FCV % % 56    FEV1-Pre L 1.11   FEV1-Predicted Pre % 56   FEV1-Post L 1.31   DLCO uncorrected ml/min/mmHg 8.29   DLCO UNC% % 36   DLCO corrected ml/min/mmHg 8.12   DLCO COR %Predicted % 35   DLVA Predicted % 47   TLC L 5.01   TLC % Predicted % 102   RV % Predicted % 124   PFT 2018: moderate obstruction, significant bronchodilator response, air trapping, severe diffusion defect  Labs:  Path:  Echo:  Heart Catheterization:    Assessment & Plan:   Chronic respiratory failure with hypoxia (HCC) - Plan: Pulse oximetry, overnight  Chronic obstructive pulmonary disease, unspecified COPD type (HCC)  Elevated brain natriuretic peptide (BNP) level  Congestive heart failure, unspecified HF chronicity, unspecified heart failure type (HCC) - Plan: ECHOCARDIOGRAM COMPLETE  Assessment and Plan Jadaya Sommerfield is an 83 year old woman with history of COPD and chronic hypoxemic respiratory who returns to pulmonary clinic for follow up.   Chronic obstructive pulmonary disease (COPD) with chronic respiratory failure with hypoxemia - Continue Breztri  inhaler, 2 puffs twice daily. - Order overnight oxygen  test to assess nocturnal hypoxemia. - Perform walk test to assess oxygen  saturation during activity.  Possible Congestive Heart Failure - Schedule echocardiogram to evaluate cardiac function. Elevated BNP  Follow up in 4 months  Dorn Chill, MD [facility] Office: [phone]   Current Outpatient Medications:    albuterol  (VENTOLIN  HFA) 108 (90 Base) MCG/ACT inhaler, Inhale 2 puffs into the lungs every 6 (six) hours as needed for wheezing or shortness of breath., Disp: 1 each, Rfl: 3   aspirin  EC 81 MG tablet, Take 81 mg by mouth daily., Disp: , Rfl:    budesonide-glycopyrrolate-formoterol  (BREZTRI  AEROSPHERE) 160-9-4.8 MCG/ACT AERO inhaler, Inhale 2 puffs into the lungs in the morning and at bedtime., Disp: 10.7 g, Rfl: 6   chlorhexidine  (HIBICLENS ) 4 % external  liquid, Apply topically daily as needed., Disp: 473 mL, Rfl: 1   cholecalciferol  (VITAMIN D ) 1000 units tablet, Take 2,000 Units by mouth daily., Disp: , Rfl:    doxycycline  (VIBRAMYCIN ) 100 MG capsule, Take 1 capsule (100 mg total) by mouth 2 (two) times daily., Disp: 20 capsule, Rfl: 0   DULoxetine  (CYMBALTA ) 30 MG capsule, TAKE 1 CAPSULE BY MOUTH DAILY, Disp: 90 capsule, Rfl: 1   KRILL OIL PO, Take by mouth., Disp: , Rfl:    MAGNESIUM  GLYCINATE PO, Take 1 tablet by mouth daily., Disp: , Rfl:    predniSONE  (DELTASONE ) 10 MG tablet, 40 mg x 3 days, 20 mg x 3 days, 10 mg x 3 days, Disp: 21 tablet, Rfl: 0   triamcinolone  cream (KENALOG ) 0.1 %, Apply topically., Disp: , Rfl:

## 2024-05-29 DIAGNOSIS — J439 Emphysema, unspecified: Secondary | ICD-10-CM | POA: Diagnosis not present

## 2024-05-30 ENCOUNTER — Telehealth: Payer: Self-pay | Admitting: Pulmonary Disease

## 2024-05-30 NOTE — Telephone Encounter (Signed)
 ONO results: She spent 2hr 22 minutes with SpO2 88% or less.   Recommend using 2L of continuous O2 at night. Please place home oxygen  order.  Dorn Chill, MD  Pulmonary & Critical Care Office: [phone]

## 2024-05-31 ENCOUNTER — Other Ambulatory Visit: Payer: Self-pay

## 2024-05-31 ENCOUNTER — Telehealth: Payer: Self-pay | Admitting: Pulmonary Disease

## 2024-05-31 DIAGNOSIS — J9611 Chronic respiratory failure with hypoxia: Secondary | ICD-10-CM

## 2024-05-31 DIAGNOSIS — J449 Chronic obstructive pulmonary disease, unspecified: Secondary | ICD-10-CM

## 2024-05-31 NOTE — Telephone Encounter (Signed)
 Noted

## 2024-05-31 NOTE — Telephone Encounter (Signed)
 Per Sonny at [facility] This patient is already on oxygen  at night and during the day. Patient has portable system and stationary system. Does this order mean the portable system is no longer needed?

## 2024-05-31 NOTE — Progress Notes (Signed)
 Dme

## 2024-05-31 NOTE — Telephone Encounter (Signed)
 Order placed / VM/LM ok per DPR    -NFN

## 2024-06-06 ENCOUNTER — Ambulatory Visit (HOSPITAL_BASED_OUTPATIENT_CLINIC_OR_DEPARTMENT_OTHER)

## 2024-06-06 DIAGNOSIS — I509 Heart failure, unspecified: Secondary | ICD-10-CM

## 2024-06-06 LAB — ECHOCARDIOGRAM COMPLETE
AV Vena cont: 0.31 cm
Area-P 1/2: 5.27 cm2
P 1/2 time: 439 ms
S' Lateral: 3.28 cm

## 2024-06-13 ENCOUNTER — Encounter: Payer: Self-pay | Admitting: Pulmonary Disease

## 2024-07-01 ENCOUNTER — Ambulatory Visit: Payer: Self-pay | Admitting: Pulmonary Disease

## 2024-07-01 DIAGNOSIS — I509 Heart failure, unspecified: Secondary | ICD-10-CM

## 2024-07-04 ENCOUNTER — Other Ambulatory Visit: Payer: Self-pay | Admitting: Internal Medicine

## 2024-09-10 ENCOUNTER — Encounter: Payer: Self-pay | Admitting: Pulmonary Disease

## 2024-09-10 ENCOUNTER — Ambulatory Visit: Admitting: Pulmonary Disease

## 2024-09-10 ENCOUNTER — Telehealth: Payer: Self-pay

## 2024-09-10 VITALS — BP 144/82 | HR 86 | Ht 62.0 in | Wt 121.0 lb

## 2024-09-10 DIAGNOSIS — J9611 Chronic respiratory failure with hypoxia: Secondary | ICD-10-CM | POA: Diagnosis not present

## 2024-09-10 DIAGNOSIS — J449 Chronic obstructive pulmonary disease, unspecified: Secondary | ICD-10-CM

## 2024-09-10 NOTE — Progress Notes (Signed)
 Established Patient Pulmonology Office Visit   Subjective:  [name], female    DOB: [birth year]  MRN: [id number]  CC:  Chief Complaint  Patient presents with   Medical Management of Chronic Issues    Pt states copd feels worse     Discussed the use of AI scribe software for clinical note transcription with the patient, who gave verbal consent to proceed.  History of Present Illness Brooke Washington is an 83 year old female with COPD who presents with worsening shortness of breath on exertion. She is accompanied by her husband.  She has worsening shortness of breath with activity but is comfortable at rest. There have been no recent COPD exacerbations requiring hospitalization, prednisone , or antibiotics. She uses oxygen  as needed during the day. She is not using oxygen  consistently at night.  She finds the nasal cannula uncomfortable and reports epistaxis despite humidification and saline gel. She is concerned that the flow from her portable oxygen  device may be inadequate.  She has neuropathy with painful, difficult walking and uses a walker at home to prevent falls. She owns a portable pedal exerciser and has access to a recumbent bike but is not exercising regularly.        ROS    Current Outpatient Medications:    albuterol  (VENTOLIN  HFA) 108 (90 Base) MCG/ACT inhaler, Inhale 2 puffs into the lungs every 6 (six) hours as needed for wheezing or shortness of breath., Disp: 1 each, Rfl: 3   aspirin  EC 81 MG tablet, Take 81 mg by mouth daily., Disp: , Rfl:    budesonide-glycopyrrolate-formoterol  (BREZTRI  AEROSPHERE) 160-9-4.8 MCG/ACT AERO inhaler, Inhale 2 puffs into the lungs in the morning and at bedtime., Disp: 10.7 g, Rfl: 6   chlorhexidine  (HIBICLENS ) 4 % external liquid, Apply topically daily as needed., Disp: 473 mL, Rfl: 1   cholecalciferol  (VITAMIN D ) 1000 units tablet, Take 2,000 Units by mouth daily., Disp: , Rfl:    doxycycline   (VIBRAMYCIN ) 100 MG capsule, Take 1 capsule (100 mg total) by mouth 2 (two) times daily., Disp: 20 capsule, Rfl: 0   DULoxetine  (CYMBALTA ) 30 MG capsule, TAKE 1 CAPSULE BY MOUTH DAILY, Disp: 90 capsule, Rfl: 1   KRILL OIL PO, Take by mouth., Disp: , Rfl:    MAGNESIUM  GLYCINATE PO, Take 1 tablet by mouth daily., Disp: , Rfl:    predniSONE  (DELTASONE ) 10 MG tablet, 40 mg x 3 days, 20 mg x 3 days, 10 mg x 3 days, Disp: 21 tablet, Rfl: 0   triamcinolone  cream (KENALOG ) 0.1 %, Apply topically., Disp: , Rfl:       Objective:  BP (!) 144/82   Pulse 86   Ht 5' 2 (1.575 m) Comment: per pt  Wt 121 lb (54.9 kg)   LMP  (LMP Unknown)   SpO2 93%   BMI 22.13 kg/m     Physical Exam Constitutional:      General: She is not in acute distress.    Appearance: Normal appearance.  Eyes:     General: No scleral icterus.    Conjunctiva/sclera: Conjunctivae normal.  Cardiovascular:     Rate and Rhythm: Normal rate and regular rhythm.  Pulmonary:     Breath sounds: No wheezing, rhonchi or rales.  Musculoskeletal:     Right lower leg: No edema.     Left lower leg: No edema.  Skin:    General: Skin is warm and dry.  Neurological:     General: No focal  deficit present.      Diagnostic Review:  Last CBC Lab Results  Component Value Date   WBC 7.1 04/04/2024   HGB 13.6 04/04/2024   HCT 42.9 04/04/2024   MCV 99.5 04/04/2024   MCH 31.6 04/04/2024   RDW 14.3 04/04/2024   PLT 305 04/04/2024   Last metabolic panel Lab Results  Component Value Date   GLUCOSE 94 04/04/2024   NA 145 04/04/2024   K 3.8 04/04/2024   CL 105 04/04/2024   CO2 28 04/04/2024   BUN 17 04/04/2024   CREATININE 0.86 04/04/2024   GFRNONAA >60 04/04/2024   CALCIUM  10.3 04/04/2024   PHOS 2.2 (L) 12/18/2017   PROT 6.9 04/04/2024   ALBUMIN  4.2 04/04/2024   BILITOT 0.4 04/04/2024   ALKPHOS 131 (H) 04/04/2024   AST 19 04/04/2024   ALT 15 04/04/2024   ANIONGAP 13 04/04/2024       Assessment & Plan:   Assessment  & Plan Chronic obstructive pulmonary disease, unspecified COPD type (HCC)  Orders:   Ambulatory referral to Pharmacotherapy Clinic  Chronic respiratory failure with hypoxia (HCC)      Assessment and Plan Assessment & Plan Chronic obstructive pulmonary disease (COPD) with oxygen  therapy dependence COPD with worsening exertional dyspnea. Concerns about inadequate oxygen  delivery with current pulse flow settings. No recent exacerbations. Limited activity due to fall risk and neuropathy. - Plan to start ohtuvayre  nebulizer treatments twice daily - Continue breztri  inhaler 2 puffs twice daily - Encouraged gradual increase in physical activity, starting with 5 minutes daily, increasing by 1-2 minutes biweekly. - Discussed saline spray or Ayr saline gel for nasal dryness and epistaxis prevention. - Ensured humidification on oxygen  concentrators.        Return in about 4 months (around 01/09/2025) for f/u visit Dr. Kara.   Dorn KATHEE Kara, MD

## 2024-09-10 NOTE — Patient Instructions (Signed)
 Continue breztri  inhaler 2 puffs twice daily - rinse mouth out after each use  Continue supplemental oxygen  with activity and 2L at bedtime  We will complete Ohtuvayre  Nebulizer paper work today  Start ohtuvayre  nebulizer treatments twice daily  Recommend increasing your physical activity over the next few months  Follow up 4 months, call sooner if needed

## 2024-09-10 NOTE — Telephone Encounter (Signed)
 Ohtuvayre  paperwork handed into pharmacy

## 2024-09-10 NOTE — Assessment & Plan Note (Addendum)
 SABRA

## 2024-09-12 DIAGNOSIS — H52203 Unspecified astigmatism, bilateral: Secondary | ICD-10-CM | POA: Diagnosis not present

## 2024-09-12 DIAGNOSIS — Z961 Presence of intraocular lens: Secondary | ICD-10-CM | POA: Diagnosis not present

## 2024-09-14 ENCOUNTER — Telehealth: Payer: Self-pay

## 2024-09-14 NOTE — Telephone Encounter (Signed)
 Received Ohtuvayre  new start paperwork. Completed form and faxed with clinicals and insurance card copy to [facility]   Phone#: [phone] Fax#: [phone]

## 2024-11-01 ENCOUNTER — Other Ambulatory Visit: Payer: Self-pay | Admitting: Adult Health

## 2024-11-01 DIAGNOSIS — J449 Chronic obstructive pulmonary disease, unspecified: Secondary | ICD-10-CM
